# Patient Record
Sex: Male | Born: 1945 | Race: White | Hispanic: No | Marital: Married | Smoking: Former smoker
Health system: Southern US, Community
[De-identification: ages and names within clinical notes are randomized; demographics above are authoritative.]

## PROBLEM LIST (undated history)

## (undated) DIAGNOSIS — I639 Cerebral infarction, unspecified: Secondary | ICD-10-CM

## (undated) DIAGNOSIS — I10 Essential (primary) hypertension: Secondary | ICD-10-CM

## (undated) DIAGNOSIS — I471 Supraventricular tachycardia, unspecified: Secondary | ICD-10-CM

## (undated) DIAGNOSIS — N184 Chronic kidney disease, stage 4 (severe): Secondary | ICD-10-CM

## (undated) DIAGNOSIS — J9600 Acute respiratory failure, unspecified whether with hypoxia or hypercapnia: Secondary | ICD-10-CM

## (undated) DIAGNOSIS — I255 Ischemic cardiomyopathy: Secondary | ICD-10-CM

## (undated) DIAGNOSIS — I5022 Chronic systolic (congestive) heart failure: Secondary | ICD-10-CM

## (undated) DIAGNOSIS — E785 Hyperlipidemia, unspecified: Secondary | ICD-10-CM

## (undated) DIAGNOSIS — G459 Transient cerebral ischemic attack, unspecified: Secondary | ICD-10-CM

## (undated) DIAGNOSIS — J449 Chronic obstructive pulmonary disease, unspecified: Secondary | ICD-10-CM

## (undated) DIAGNOSIS — I2510 Atherosclerotic heart disease of native coronary artery without angina pectoris: Secondary | ICD-10-CM

## (undated) DIAGNOSIS — N189 Chronic kidney disease, unspecified: Secondary | ICD-10-CM

## (undated) DIAGNOSIS — I6529 Occlusion and stenosis of unspecified carotid artery: Secondary | ICD-10-CM

## (undated) DIAGNOSIS — I491 Atrial premature depolarization: Secondary | ICD-10-CM

## (undated) DIAGNOSIS — I4892 Unspecified atrial flutter: Secondary | ICD-10-CM

## (undated) DIAGNOSIS — I493 Ventricular premature depolarization: Secondary | ICD-10-CM

## (undated) DIAGNOSIS — J189 Pneumonia, unspecified organism: Secondary | ICD-10-CM

## (undated) DIAGNOSIS — K219 Gastro-esophageal reflux disease without esophagitis: Secondary | ICD-10-CM

## (undated) DIAGNOSIS — I469 Cardiac arrest, cause unspecified: Secondary | ICD-10-CM

## (undated) DIAGNOSIS — G4730 Sleep apnea, unspecified: Secondary | ICD-10-CM

## (undated) DIAGNOSIS — Z95810 Presence of automatic (implantable) cardiac defibrillator: Secondary | ICD-10-CM

## (undated) DIAGNOSIS — J9610 Chronic respiratory failure, unspecified whether with hypoxia or hypercapnia: Secondary | ICD-10-CM

## (undated) DIAGNOSIS — F0390 Unspecified dementia without behavioral disturbance: Secondary | ICD-10-CM

## (undated) DIAGNOSIS — I480 Paroxysmal atrial fibrillation: Secondary | ICD-10-CM

## (undated) HISTORY — PX: CORONARY ARTERY BYPASS GRAFT: SHX141

## (undated) HISTORY — DX: Ischemic cardiomyopathy: I25.5

## (undated) HISTORY — DX: Presence of automatic (implantable) cardiac defibrillator: Z95.810

## (undated) HISTORY — DX: Occlusion and stenosis of unspecified carotid artery: I65.29

## (undated) HISTORY — DX: Pneumonia, unspecified organism: J18.9

## (undated) HISTORY — PX: EYE SURGERY: SHX253

## (undated) HISTORY — DX: Unspecified dementia, unspecified severity, without behavioral disturbance, psychotic disturbance, mood disturbance, and anxiety: F03.90

## (undated) HISTORY — DX: Cerebral infarction, unspecified: I63.9

## (undated) HISTORY — PX: CARDIAC PACEMAKER PLACEMENT: SHX583

## (undated) HISTORY — DX: Essential (primary) hypertension: I10

## (undated) HISTORY — DX: Acute respiratory failure, unspecified whether with hypoxia or hypercapnia: J96.00

## (undated) HISTORY — DX: Sleep apnea, unspecified: G47.30

## (undated) HISTORY — DX: Atherosclerotic heart disease of native coronary artery without angina pectoris: I25.10

## (undated) HISTORY — PX: CATARACT EXTRACTION: SUR2

## (undated) HISTORY — DX: Chronic kidney disease, unspecified: N18.9

## (undated) HISTORY — DX: Chronic obstructive pulmonary disease, unspecified: J44.9

## (undated) HISTORY — DX: Gastro-esophageal reflux disease without esophagitis: K21.9

## (undated) HISTORY — DX: Paroxysmal atrial fibrillation: I48.0

## (undated) HISTORY — PX: CORONARY STENT PLACEMENT: SHX1402

## (undated) HISTORY — DX: Transient cerebral ischemic attack, unspecified: G45.9

## (undated) HISTORY — DX: Hyperlipidemia, unspecified: E78.5

---

## 1998-09-03 ENCOUNTER — Emergency Department (HOSPITAL_COMMUNITY): Admission: EM | Admit: 1998-09-03 | Discharge: 1998-09-03 | Payer: Self-pay | Admitting: Emergency Medicine

## 1999-10-08 ENCOUNTER — Encounter: Payer: Self-pay | Admitting: Cardiology

## 1999-10-08 ENCOUNTER — Ambulatory Visit (HOSPITAL_COMMUNITY): Admission: RE | Admit: 1999-10-08 | Discharge: 1999-10-08 | Payer: Self-pay | Admitting: Cardiology

## 2014-11-10 ENCOUNTER — Encounter: Payer: Self-pay | Admitting: Cardiology

## 2015-11-23 ENCOUNTER — Other Ambulatory Visit: Payer: Self-pay | Admitting: *Deleted

## 2015-11-23 ENCOUNTER — Encounter: Payer: Self-pay | Admitting: Vascular Surgery

## 2015-11-23 DIAGNOSIS — I639 Cerebral infarction, unspecified: Secondary | ICD-10-CM

## 2015-11-23 DIAGNOSIS — I6523 Occlusion and stenosis of bilateral carotid arteries: Secondary | ICD-10-CM

## 2015-11-27 ENCOUNTER — Ambulatory Visit (HOSPITAL_COMMUNITY)
Admission: RE | Admit: 2015-11-27 | Discharge: 2015-11-27 | Disposition: A | Discharge status: Home / Self Care | Payer: Medicare HMO | Source: Ambulatory Visit | Attending: Vascular Surgery | Admitting: Vascular Surgery

## 2015-11-27 ENCOUNTER — Encounter: Payer: Self-pay | Admitting: Vascular Surgery

## 2015-11-27 DIAGNOSIS — E785 Hyperlipidemia, unspecified: Secondary | ICD-10-CM | POA: Insufficient documentation

## 2015-11-27 DIAGNOSIS — I6523 Occlusion and stenosis of bilateral carotid arteries: Secondary | ICD-10-CM | POA: Insufficient documentation

## 2015-11-27 DIAGNOSIS — G459 Transient cerebral ischemic attack, unspecified: Secondary | ICD-10-CM | POA: Diagnosis not present

## 2015-11-27 DIAGNOSIS — I639 Cerebral infarction, unspecified: Secondary | ICD-10-CM

## 2015-11-27 DIAGNOSIS — N189 Chronic kidney disease, unspecified: Secondary | ICD-10-CM | POA: Insufficient documentation

## 2015-11-27 DIAGNOSIS — I129 Hypertensive chronic kidney disease with stage 1 through stage 4 chronic kidney disease, or unspecified chronic kidney disease: Secondary | ICD-10-CM | POA: Diagnosis not present

## 2015-12-01 ENCOUNTER — Encounter: Payer: Self-pay | Admitting: Vascular Surgery

## 2015-12-01 ENCOUNTER — Ambulatory Visit (INDEPENDENT_AMBULATORY_CARE_PROVIDER_SITE_OTHER): Payer: Medicare HMO | Admitting: Vascular Surgery

## 2015-12-01 VITALS — BP 150/77 | HR 61 | Temp 98.1°F | Resp 16 | Ht 69.0 in | Wt 167.0 lb

## 2015-12-01 DIAGNOSIS — I6522 Occlusion and stenosis of left carotid artery: Secondary | ICD-10-CM

## 2015-12-01 DIAGNOSIS — I6521 Occlusion and stenosis of right carotid artery: Secondary | ICD-10-CM | POA: Diagnosis not present

## 2015-12-01 NOTE — Addendum Note (Signed)
Addended by: Thresa Ross C on: 12/01/2015 02:05 PM   Modules accepted: Orders

## 2015-12-01 NOTE — Progress Notes (Signed)
Filed Vitals:   12/01/15 1128 12/01/15 1133 12/01/15 1138 12/01/15 1140  BP: 154/75 151/75 156/75 150/77  Pulse: 60 60 60 61  Temp:  98.1 F (36.7 C)    TempSrc:  Oral    Resp:  16    Height:  5\' 9"  (1.753 m)    Weight:  167 lb (75.751 kg)    SpO2:  98%

## 2015-12-01 NOTE — Progress Notes (Signed)
[facility]ist [facility]  [name] MRN: [id number] DOB: [birth year] Sex: male  REASON FOR CONSULT: establish carotid follow-up   HPI: Joe Wiggins is a 69 y.o. male, who was referred here by Dr. Wynonia Lawman to establish care. The patient is s/p left carotid stenting by Dr. Maryjean Morn back in 2013. He suffered stroke during the procedure and on postoperative day 1. He has a known right internal carotid occlusion. It is unclear whether this was associated with a stroke or not. He has mild neurological deficits including some memory issues and a slight foot drop. He denies any amaurosis fugax, receptive or expressive aphasia, facial droop or sudden numbness/weakness of his arms or legs.   He has a past medical history of paroxysmal atrial fibrillation managed on eliquis. He has CAD s/p CABG. He has hyperlipidemia managed on a statin and hypertension managed on two antihypertensives. He takes a daily aspirin. He is not diabetic. He is a former smoker.  On ROS, he denies any issues with ambulation. He walks around 2 miles a day and only has to rest secondary to feeling tired. Full ROS below.   Past Medical History  Diagnosis Date  . Chronic kidney disease   . AF (paroxysmal atrial fibrillation) ([place])   . Hypertension   . Sleep apnea   . COPD (chronic obstructive pulmonary disease) ([place])   . TIA (transient ischemic attack)   . Dementia without behavioral disturbance   . CAD (coronary artery disease)   . Cardiomyopathy, ischemic   . Carotid artery occlusion   . Hyperlipidemia     History reviewed. No pertinent family history.  SOCIAL HISTORY: Social History   Social History  . Marital Status: Married    Spouse Name: N/A  . Number of Children: N/A  . Years of Education: N/A   Occupational History  . Not on file.   Social History Main Topics  . Smoking status: Former Smoker    Quit date: 11/30/2012  . Smokeless tobacco: Never Used  . Alcohol Use: No  . Drug  Use: No  . Sexual Activity: Not on file   Other Topics Concern  . Not on file   Social History Narrative    Allergies  Allergen Reactions  . Morphine And Related Nausea Only  . Vicodin [Hydrocodone-Acetaminophen] Other (See Comments)    Hallucinations   . Penicillins Rash    Current Outpatient Prescriptions  Medication Sig Dispense Refill  . apixaban (ELIQUIS) 5 MG TABS tablet Take 5 mg by mouth 2 (two) times daily.    Marland Kitchen aspirin 81 MG chewable tablet Chew by mouth daily.    Marland Kitchen atorvastatin (LIPITOR) 40 MG tablet Take 40 mg by mouth daily.    . Cholecalciferol (VITAMIN D3) 3000 UNITS TABS Take 1,000 Units by mouth daily.    . Cyanocobalamin (VITAMIN B 12 PO) Take by mouth daily.    Marland Kitchen donepezil (ARICEPT) 10 MG tablet Take 10 mg by mouth at bedtime.    . famotidine (PEPCID) 20 MG tablet Take 20 mg by mouth daily.    . Fluticasone-Salmeterol (ADVAIR) 250-50 MCG/DOSE AEPB Inhale 1 puff into the lungs 2 (two) times daily.    . folic acid (FOLVITE) A999333 MCG tablet Take 400 mcg by mouth daily.    . hydrALAZINE (APRESOLINE) 25 MG tablet Take 25 mg by mouth 3 (three) times daily. Per medication list from Dr Serita Grit Tilley's office notes: take 1/2 tablet po three times daily    .  labetalol (NORMODYNE) 200 MG tablet Take 200 mg by mouth 2 (two) times daily.    Marland Kitchen levalbuterol (XOPENEX) 1.25 MG/3ML nebulizer solution Take 1.25 mg by nebulization as needed for wheezing.    Marland Kitchen loratadine (CLARITIN) 10 MG tablet Take 10 mg by mouth daily.    . Memantine HCl (NAMENDA XR PO) Take 7 mg by mouth.    . Multiple Vitamin (MULTIVITAMIN) tablet Take 1 tablet by mouth daily.    . nitroGLYCERIN (NITROSTAT) 0.4 MG SL tablet Place 0.4 mg under the tongue every 5 (five) minutes as needed for chest pain.    Marland Kitchen tiotropium (SPIRIVA HANDIHALER) 18 MCG inhalation capsule Place 18 mcg into inhaler and inhale daily.    Marland Kitchen torsemide (DEMADEX) 20 MG tablet Take 20 mg by mouth daily.     No current facility-administered  medications for this visit.    REVIEW OF SYSTEMS:  [X]  denotes positive finding, [ ]  denotes negative finding Cardiac  Comments:  Chest pain or chest pressure:    Shortness of breath upon exertion:    Short of breath when lying flat:    Irregular heart rhythm:        Vascular    Pain in calf, thigh, or hip brought on by ambulation:    Pain in feet at night that wakes you up from your sleep:     Blood clot in your veins:    Leg swelling:         Pulmonary    Oxygen at home:    Productive cough:     Wheezing:         Neurologic    Sudden weakness in arms or legs:     Sudden numbness in arms or legs:     Sudden onset of difficulty speaking or slurred speech:    Temporary loss of vision in one eye:     Problems with dizziness:         Gastrointestinal    Blood in stool:     Vomited blood:         Genitourinary    Burning when urinating:     Blood in urine:        Psychiatric    Major depression:         Hematologic    Bleeding problems:    Problems with blood clotting too easily:        Skin    Rashes or ulcers:        Constitutional    Fever or chills:      PHYSICAL EXAM: Filed Vitals:   12/01/15 1128 12/01/15 1133 12/01/15 1138 12/01/15 1140  BP: 154/75 151/75 156/75 150/77  Pulse: 60 60 60 61  Temp:  98.1 F (36.7 C)    TempSrc:  Oral    Resp:  16    Height:  5\' 9"  (1.753 m)    Weight:  167 lb (75.751 kg)    SpO2:  98%      GENERAL: The patient is a well-nourished male, in no acute distress. The vital signs are documented above. CARDIAC: There is a regular rate and rhythm.  VASCULAR: bilateral carotid bruits, radial pulses 2+, feet are warm and well perfused. No ischemic changes.  PULMONARY: There is good air exchange bilaterally without wheezing or rales. ABDOMEN: Soft and non-tender with normal pitched bowel sounds.  MUSCULOSKELETAL: No muscle wasting or atrophy NEUROLOGIC: 5/5 strength upper and lower extremities bilaterally. Sensation intact.  No focal deficits.  PSYCHIATRIC: The  patient has a normal affect.  DATA:   Carotid Duplex 12/01/15 Right ICA occlusion, antegrade vertebral flow Patent left ICA stent, PSV 117cm/s at proximal ICA, EDV 41 cm/s, antegrade vertebral flow   10 pages of medical records from former vascular surgeon were reviewed.   MEDICAL ISSUES:  Occluded right carotid artery Left carotid stenosis s/p carotid stenting (2013)  The patient's left carotid stent is patent. He has been asymptomatic without any TIA or stroke symptoms. He has a known right carotid occlusion. There was no known stroke associated with this.  He is on maximal medical management with aspirin and a statin. He is on eliquis for atrial fibrillation. He is a former smoker. He will follow-up in one year with repeat carotid duplex. He knows to see emergency attention if he were to develop any acute neurological symptoms.    Alvia Grove, PA-C [facility]     I have examined the patient, reviewed and agree with above. Discussed with patient and wife. Recommended yearly duplex for follow-up of his carotid stent particularly in light of contralateral carotid occlusion. Discussed symptoms of carotid disease and posterior. Immediately to the emergency room should this occur  Curt Jews, MD 12/01/2015 1:17 PM

## 2015-12-02 ENCOUNTER — Encounter: Payer: Self-pay | Admitting: Surgery

## 2016-01-17 ENCOUNTER — Observation Stay (HOSPITAL_COMMUNITY)
Admission: EM | Admit: 2016-01-17 | Discharge: 2016-01-19 | Disposition: A | Discharge status: Home / Self Care | Payer: PPO | Attending: Internal Medicine | Admitting: Internal Medicine

## 2016-01-17 ENCOUNTER — Emergency Department (HOSPITAL_COMMUNITY): Payer: PPO

## 2016-01-17 ENCOUNTER — Encounter (HOSPITAL_COMMUNITY): Payer: Self-pay

## 2016-01-17 ENCOUNTER — Observation Stay (HOSPITAL_COMMUNITY): Payer: PPO

## 2016-01-17 DIAGNOSIS — R42 Dizziness and giddiness: Secondary | ICD-10-CM

## 2016-01-17 DIAGNOSIS — Z8673 Personal history of transient ischemic attack (TIA), and cerebral infarction without residual deficits: Secondary | ICD-10-CM | POA: Diagnosis not present

## 2016-01-17 DIAGNOSIS — F0390 Unspecified dementia without behavioral disturbance: Secondary | ICD-10-CM | POA: Insufficient documentation

## 2016-01-17 DIAGNOSIS — Z885 Allergy status to narcotic agent status: Secondary | ICD-10-CM | POA: Insufficient documentation

## 2016-01-17 DIAGNOSIS — N189 Chronic kidney disease, unspecified: Secondary | ICD-10-CM | POA: Insufficient documentation

## 2016-01-17 DIAGNOSIS — I6529 Occlusion and stenosis of unspecified carotid artery: Secondary | ICD-10-CM | POA: Diagnosis not present

## 2016-01-17 DIAGNOSIS — Z880 Allergy status to penicillin: Secondary | ICD-10-CM | POA: Insufficient documentation

## 2016-01-17 DIAGNOSIS — E785 Hyperlipidemia, unspecified: Secondary | ICD-10-CM | POA: Insufficient documentation

## 2016-01-17 DIAGNOSIS — I129 Hypertensive chronic kidney disease with stage 1 through stage 4 chronic kidney disease, or unspecified chronic kidney disease: Secondary | ICD-10-CM | POA: Diagnosis not present

## 2016-01-17 DIAGNOSIS — I252 Old myocardial infarction: Secondary | ICD-10-CM | POA: Insufficient documentation

## 2016-01-17 DIAGNOSIS — I482 Chronic atrial fibrillation: Secondary | ICD-10-CM

## 2016-01-17 DIAGNOSIS — I10 Essential (primary) hypertension: Secondary | ICD-10-CM | POA: Diagnosis not present

## 2016-01-17 DIAGNOSIS — J449 Chronic obstructive pulmonary disease, unspecified: Secondary | ICD-10-CM | POA: Insufficient documentation

## 2016-01-17 DIAGNOSIS — I639 Cerebral infarction, unspecified: Secondary | ICD-10-CM | POA: Diagnosis not present

## 2016-01-17 DIAGNOSIS — N184 Chronic kidney disease, stage 4 (severe): Secondary | ICD-10-CM | POA: Insufficient documentation

## 2016-01-17 DIAGNOSIS — G459 Transient cerebral ischemic attack, unspecified: Secondary | ICD-10-CM | POA: Insufficient documentation

## 2016-01-17 DIAGNOSIS — N182 Chronic kidney disease, stage 2 (mild): Secondary | ICD-10-CM

## 2016-01-17 DIAGNOSIS — I2510 Atherosclerotic heart disease of native coronary artery without angina pectoris: Secondary | ICD-10-CM | POA: Diagnosis not present

## 2016-01-17 DIAGNOSIS — N183 Chronic kidney disease, stage 3 unspecified: Secondary | ICD-10-CM | POA: Insufficient documentation

## 2016-01-17 DIAGNOSIS — R930 Abnormal findings on diagnostic imaging of skull and head, not elsewhere classified: Secondary | ICD-10-CM | POA: Diagnosis not present

## 2016-01-17 DIAGNOSIS — J410 Simple chronic bronchitis: Secondary | ICD-10-CM

## 2016-01-17 DIAGNOSIS — Z955 Presence of coronary angioplasty implant and graft: Secondary | ICD-10-CM | POA: Diagnosis not present

## 2016-01-17 DIAGNOSIS — Z950 Presence of cardiac pacemaker: Secondary | ICD-10-CM | POA: Diagnosis not present

## 2016-01-17 DIAGNOSIS — Z7982 Long term (current) use of aspirin: Secondary | ICD-10-CM | POA: Diagnosis not present

## 2016-01-17 DIAGNOSIS — J441 Chronic obstructive pulmonary disease with (acute) exacerbation: Secondary | ICD-10-CM | POA: Insufficient documentation

## 2016-01-17 DIAGNOSIS — Z7901 Long term (current) use of anticoagulants: Secondary | ICD-10-CM | POA: Insufficient documentation

## 2016-01-17 DIAGNOSIS — G4730 Sleep apnea, unspecified: Secondary | ICD-10-CM | POA: Diagnosis present

## 2016-01-17 DIAGNOSIS — Z87891 Personal history of nicotine dependence: Secondary | ICD-10-CM | POA: Diagnosis not present

## 2016-01-17 DIAGNOSIS — G4733 Obstructive sleep apnea (adult) (pediatric): Secondary | ICD-10-CM | POA: Diagnosis not present

## 2016-01-17 DIAGNOSIS — I119 Hypertensive heart disease without heart failure: Secondary | ICD-10-CM | POA: Insufficient documentation

## 2016-01-17 DIAGNOSIS — I6523 Occlusion and stenosis of bilateral carotid arteries: Secondary | ICD-10-CM | POA: Insufficient documentation

## 2016-01-17 DIAGNOSIS — N179 Acute kidney failure, unspecified: Secondary | ICD-10-CM | POA: Insufficient documentation

## 2016-01-17 DIAGNOSIS — R040 Epistaxis: Secondary | ICD-10-CM | POA: Diagnosis not present

## 2016-01-17 DIAGNOSIS — R9089 Other abnormal findings on diagnostic imaging of central nervous system: Secondary | ICD-10-CM | POA: Diagnosis present

## 2016-01-17 DIAGNOSIS — I255 Ischemic cardiomyopathy: Secondary | ICD-10-CM | POA: Diagnosis not present

## 2016-01-17 DIAGNOSIS — E875 Hyperkalemia: Secondary | ICD-10-CM | POA: Diagnosis not present

## 2016-01-17 DIAGNOSIS — I480 Paroxysmal atrial fibrillation: Secondary | ICD-10-CM | POA: Insufficient documentation

## 2016-01-17 DIAGNOSIS — J438 Other emphysema: Secondary | ICD-10-CM

## 2016-01-17 DIAGNOSIS — D649 Anemia, unspecified: Secondary | ICD-10-CM | POA: Diagnosis present

## 2016-01-17 HISTORY — DX: Dizziness and giddiness: R42

## 2016-01-17 LAB — CBC
HCT: 32.8 % — ABNORMAL LOW (ref 39.0–52.0)
Hemoglobin: 10.7 g/dL — ABNORMAL LOW (ref 13.0–17.0)
MCH: 32.5 pg (ref 26.0–34.0)
MCHC: 32.6 g/dL (ref 30.0–36.0)
MCV: 99.7 fL (ref 78.0–100.0)
Platelets: 261 10*3/uL (ref 150–400)
RBC: 3.29 MIL/uL — ABNORMAL LOW (ref 4.22–5.81)
RDW: 14.3 % (ref 11.5–15.5)
WBC: 5.3 10*3/uL (ref 4.0–10.5)

## 2016-01-17 LAB — TROPONIN I: Troponin I: <0.03 ng/mL (ref <0.031)

## 2016-01-17 LAB — LIPID PANEL
Cholesterol: 129 mg/dL (ref 0–200)
HDL: 28 mg/dL — ABNORMAL LOW (ref >40)
LDL Cholesterol: 76 mg/dL (ref 0–99)
Total CHOL/HDL Ratio: 4.6 RATIO
Triglycerides: 123 mg/dL (ref <150)
VLDL: 25 mg/dL (ref 0–40)

## 2016-01-17 LAB — BASIC METABOLIC PANEL
Anion gap: 7 (ref 5–15)
BUN: 25 mg/dL — ABNORMAL HIGH (ref 6–20)
CO2: 29 mmol/L (ref 22–32)
Calcium: 10.4 mg/dL — ABNORMAL HIGH (ref 8.9–10.3)
Chloride: 104 mmol/L (ref 101–111)
Creatinine, Ser: 1.65 mg/dL — ABNORMAL HIGH (ref 0.61–1.24)
GFR calc Af Amer: 47 mL/min — ABNORMAL LOW (ref >60)
GFR calc non Af Amer: 40 mL/min — ABNORMAL LOW (ref >60)
Glucose, Bld: 103 mg/dL — ABNORMAL HIGH (ref 65–99)
Potassium: 5.4 mmol/L — ABNORMAL HIGH (ref 3.5–5.1)
Sodium: 140 mmol/L (ref 135–145)

## 2016-01-17 LAB — URINALYSIS, ROUTINE W REFLEX MICROSCOPIC
Bilirubin Urine: NEGATIVE
Glucose, UA: NEGATIVE mg/dL
Hgb urine dipstick: NEGATIVE
Ketones, ur: NEGATIVE mg/dL
Leukocytes, UA: NEGATIVE
Nitrite: NEGATIVE
Protein, ur: NEGATIVE mg/dL
Specific Gravity, Urine: 1.015 (ref 1.005–1.030)
pH: 7 (ref 5.0–8.0)

## 2016-01-17 LAB — TSH: TSH: 0.574 u[IU]/mL (ref 0.350–4.500)

## 2016-01-17 LAB — I-STAT TROPONIN, ED: Troponin i, poc: 0.01 ng/mL (ref 0.00–0.08)

## 2016-01-17 MED ORDER — ALBUTEROL SULFATE (2.5 MG/3ML) 0.083% IN NEBU
2.5000 mg | INHALATION_SOLUTION | Freq: Three times a day (TID) | RESPIRATORY_TRACT | Status: DC
  Filled 2016-01-17: qty 3

## 2016-01-17 MED ORDER — ATORVASTATIN CALCIUM 40 MG PO TABS
40.0000 mg | ORAL_TABLET | Freq: Every day | ORAL | Status: DC
  Administered 2016-01-18 – 2016-01-19 (×2): 40 mg via ORAL
  Filled 2016-01-17 (×2): qty 1

## 2016-01-17 MED ORDER — ONDANSETRON HCL 4 MG/2ML IJ SOLN: 4.0000 mg | Freq: Four times a day (QID) | INTRAMUSCULAR | Status: DC | PRN

## 2016-01-17 MED ORDER — TIOTROPIUM BROMIDE MONOHYDRATE 18 MCG IN CAPS
18.0000 ug | ORAL_CAPSULE | Freq: Every day | RESPIRATORY_TRACT | Status: DC
  Administered 2016-01-19: 18 ug via RESPIRATORY_TRACT
  Filled 2016-01-17: qty 5

## 2016-01-17 MED ORDER — SODIUM CHLORIDE 0.9 % IJ SOLN
3.0000 mL | Freq: Two times a day (BID) | INTRAMUSCULAR | Status: DC
  Administered 2016-01-17 – 2016-01-19 (×4): 3 mL via INTRAVENOUS

## 2016-01-17 MED ORDER — POLYETHYLENE GLYCOL 3350 17 G PO PACK
17.0000 g | PACK | Freq: Every day | ORAL | Status: DC | PRN
  Filled 2016-01-17: qty 1

## 2016-01-17 MED ORDER — ASPIRIN 81 MG PO CHEW
81.0000 mg | CHEWABLE_TABLET | Freq: Every day | ORAL | Status: DC
  Administered 2016-01-18 – 2016-01-19 (×2): 81 mg via ORAL
  Filled 2016-01-17 (×2): qty 1

## 2016-01-17 MED ORDER — FOLIC ACID 400 MCG PO TABS: 400.0000 ug | ORAL_TABLET | Freq: Every day | ORAL | Status: DC

## 2016-01-17 MED ORDER — ONDANSETRON HCL 4 MG PO TABS: 4.0000 mg | ORAL_TABLET | Freq: Four times a day (QID) | ORAL | Status: DC | PRN

## 2016-01-17 MED ORDER — HYDRALAZINE HCL 25 MG PO TABS
25.0000 mg | ORAL_TABLET | Freq: Once | ORAL | Status: AC
Start: 2016-01-17 — End: 2016-01-17
  Administered 2016-01-17: 25 mg via ORAL
  Filled 2016-01-17: qty 1

## 2016-01-17 MED ORDER — ALBUTEROL SULFATE (5 MG/ML) 0.5% IN NEBU: 2.5000 mg | INHALATION_SOLUTION | Freq: Four times a day (QID) | RESPIRATORY_TRACT | Status: DC

## 2016-01-17 MED ORDER — HYDRALAZINE HCL 20 MG/ML IJ SOLN
10.0000 mg | Freq: Four times a day (QID) | INTRAMUSCULAR | Status: DC | PRN
  Administered 2016-01-17 – 2016-01-19 (×3): 10 mg via INTRAVENOUS
  Filled 2016-01-17 (×3): qty 1

## 2016-01-17 MED ORDER — STROKE: EARLY STAGES OF RECOVERY BOOK
Freq: Once | Status: DC
  Filled 2016-01-17: qty 1

## 2016-01-17 MED ORDER — SODIUM CHLORIDE 0.9 % IV BOLUS (SEPSIS)
1000.0000 mL | Freq: Once | INTRAVENOUS | Status: AC
  Administered 2016-01-17: 1000 mL via INTRAVENOUS

## 2016-01-17 MED ORDER — FAMOTIDINE 20 MG PO TABS
20.0000 mg | ORAL_TABLET | Freq: Every day | ORAL | Status: DC
  Administered 2016-01-18 – 2016-01-19 (×2): 20 mg via ORAL
  Filled 2016-01-17 (×2): qty 1

## 2016-01-17 MED ORDER — MOMETASONE FURO-FORMOTEROL FUM 100-5 MCG/ACT IN AERO
2.0000 | INHALATION_SPRAY | Freq: Two times a day (BID) | RESPIRATORY_TRACT | Status: DC
  Administered 2016-01-17 – 2016-01-19 (×3): 2 via RESPIRATORY_TRACT
  Filled 2016-01-17: qty 8.8

## 2016-01-17 MED ORDER — SODIUM CHLORIDE 0.9 % IV SOLN: INTRAVENOUS | Status: AC

## 2016-01-17 MED ORDER — APIXABAN 5 MG PO TABS
5.0000 mg | ORAL_TABLET | Freq: Two times a day (BID) | ORAL | Status: DC
  Administered 2016-01-18 – 2016-01-19 (×3): 5 mg via ORAL
  Filled 2016-01-17 (×4): qty 1

## 2016-01-17 MED ORDER — ALUM & MAG HYDROXIDE-SIMETH 200-200-20 MG/5ML PO SUSP: 30.0000 mL | Freq: Four times a day (QID) | ORAL | Status: DC | PRN

## 2016-01-17 MED ORDER — FOLIC ACID 1 MG PO TABS
0.5000 mg | ORAL_TABLET | Freq: Every day | ORAL | Status: DC
  Administered 2016-01-17 – 2016-01-19 (×3): 0.5 mg via ORAL
  Filled 2016-01-17 (×3): qty 1

## 2016-01-17 MED ORDER — TORSEMIDE 20 MG PO TABS
20.0000 mg | ORAL_TABLET | Freq: Every day | ORAL | Status: DC
  Administered 2016-01-18 – 2016-01-19 (×2): 20 mg via ORAL
  Filled 2016-01-17 (×2): qty 1

## 2016-01-17 MED ORDER — ALBUTEROL SULFATE (2.5 MG/3ML) 0.083% IN NEBU
2.5000 mg | INHALATION_SOLUTION | Freq: Four times a day (QID) | RESPIRATORY_TRACT | Status: DC
  Administered 2016-01-17: 2.5 mg via RESPIRATORY_TRACT
  Filled 2016-01-17: qty 3

## 2016-01-17 MED ORDER — DONEPEZIL HCL 10 MG PO TABS
10.0000 mg | ORAL_TABLET | Freq: Every day | ORAL | Status: DC
  Administered 2016-01-17 – 2016-01-18 (×2): 10 mg via ORAL
  Filled 2016-01-17 (×2): qty 1

## 2016-01-17 MED ORDER — SENNOSIDES-DOCUSATE SODIUM 8.6-50 MG PO TABS: 1.0000 | ORAL_TABLET | Freq: Every evening | ORAL | Status: DC | PRN

## 2016-01-17 MED ORDER — HYDRALAZINE HCL 25 MG PO TABS
25.0000 mg | ORAL_TABLET | Freq: Three times a day (TID) | ORAL | Status: DC
  Administered 2016-01-17 – 2016-01-19 (×5): 25 mg via ORAL
  Filled 2016-01-17 (×6): qty 1

## 2016-01-17 NOTE — ED Notes (Signed)
Pt. s wife reports that he had a hx of  Strokes and pt. Reports that he continues to feel dizzy.

## 2016-01-17 NOTE — ED Notes (Signed)
Vital signs stable. 

## 2016-01-17 NOTE — ED Notes (Signed)
Pt given urinal for specimen.  

## 2016-01-17 NOTE — Progress Notes (Signed)
Patient arrived at 5M20. Patient is alert and oriented x3, disoriented to situation. Denies pain, NIHHS 1 for numbness/tingling in left foot which patient states is his baseline from previous stroke, VSS, tele started on box 17, states he is on a CPAP at home, respiratory called and aware that patient wears CPAP at night, will set patient up. Patient and family oriented to unit, staff, plan of care. Safety measures in place, will continue to monitor closely.,

## 2016-01-17 NOTE — Consult Note (Signed)
Referring Physician: ED    Chief Complaint: transient dizziness, question of new stroke on CT brain  HPI:                                                                                                                                         Joe Wiggins is an 70 y.o. male with a past medical history significant for HTN, HLD, CAD s/p CABG, ischemic cardiomyopathy, PAF on apixaban, s/p pacemaker placement, carotid artery disease, ischemic infarct with mild left LE weakness as residual, and dementia, presents to the ED for evaluation of the above stated symptoms. Patient was in his usual state of health until earlier today when started having a nosebleed and feeling " dizzy, swimmy headed, kind of off balance". That sensation was not really vertiginous and lasted for approximately 30 minutes. No associated HA, double vision, difficulty swallowing, slurred speech, language or vision impairment. CT head reveals " more focal hypoattenuation subcortical white matter of the posterior right frontal lobe". Date last known normal: 01/12/16 Time last known well: unclear tPA Given:  No, symptoms resolved   Past Medical History  Diagnosis Date  . Chronic kidney disease   . AF (paroxysmal atrial fibrillation) ([place])   . Hypertension   . Sleep apnea   . COPD (chronic obstructive pulmonary disease) ([place])   . TIA (transient ischemic attack)   . Dementia without behavioral disturbance   . CAD (coronary artery disease)   . Cardiomyopathy, ischemic   . Carotid artery occlusion   . Hyperlipidemia     Past Surgical History  Procedure Laterality Date  . Cardiac pacemaker placement    . Coronary stent placement    . Coronary artery bypass graft    . Cataract extraction    . Eye surgery      No family history on file. Social History:  reports that he quit smoking about 3 years ago. He has never used smokeless tobacco. He reports that he does not drink alcohol or use illicit drugs. Allergies:  Allergies   Allergen Reactions  . Morphine And Related Nausea Only  . Vicodin [Hydrocodone-Acetaminophen] Other (See Comments)    Hallucinations   . Penicillins Rash    Medications:  I have reviewed the patient's current medications.  ROS:                                                                                                                                       History obtained from chart review and the patient  General ROS: negative for - chills, fatigue, fever, night sweats, weight gain or weight loss Psychological ROS: negative for - behavioral disorder, hallucinations, mood swings or suicidal ideation Ophthalmic ROS: negative for - blurry vision, double vision, eye pain or loss of vision ENT ROS: negative for - epistaxis, nasal discharge, oral lesions, sore throat, tinnitus or vertigo Allergy and Immunology ROS: negative for - hives or itchy/watery eyes Hematological and Lymphatic ROS: negative for - bleeding problems, bruising or swollen lymph nodes Endocrine ROS: negative for - galactorrhea, hair pattern changes, polydipsia/polyuria or temperature intolerance Respiratory ROS: negative for - cough, hemoptysis, shortness of breath or wheezing Cardiovascular ROS: negative for - chest pain, dyspnea on exertion, edema or irregular heartbeat Gastrointestinal ROS: negative for - abdominal pain, diarrhea, hematemesis, nausea/vomiting or stool incontinence Genito-Urinary ROS: negative for - dysuria, hematuria, incontinence or urinary frequency/urgency Musculoskeletal ROS: negative for - joint swelling Neurological ROS: as noted in HPI Dermatological ROS: negative for rash and skin lesion changes   Physical exam:  Constitutional: well developed, pleasant male in no apparent distress. Blood pressure 195/95, pulse 66, temperature 98 F (36.7 C), temperature source Oral,  resp. rate 15, height _0  (1.753 m), weight 73.993 kg (163 lb 2 oz), SpO2 96 %. Eyes: no jaundice or exophthalmos.  Head: normocephalic. Neck: supple, no bruits, no JVD. Cardiac: no murmurs. Lungs: clear. Abdomen: soft, no tender, no mass. Extremities: no edema, clubbing, or cyanosis.  Skin: no rash  Neurologic Examination:                                                                                                      General: NAD Mental Status: Alert, oriented, thought content appropriate.  Speech fluent without evidence of aphasia.  Able to follow 3 step commands without difficulty. Cranial Nerves: II: Discs flat bilaterally; Visual fields grossly normal, pupils equal, round, reactive to light and accommodation III,IV, VI: ptosis not present, extra-ocular motions intact bilaterally V,VII: smile symmetric, facial light touch sensation normal bilaterally VIII: hearing normal bilaterally IX,X: uvula rises symmetrically XI: bilateral shoulder shrug XII: midline tongue extension without atrophy or fasciculations  Motor: Right : Upper extremity   5/5    Left:  Upper extremity   5/5  Lower extremity   5/5     Lower extremity   5/5 Tone and bulk:normal tone throughout; no atrophy noted Sensory: Pinprick and light touch intact throughout, bilaterally Deep Tendon Reflexes:  Right: Upper Extremity   Left: Upper extremity   biceps (C-5 to C-6) 2/4   biceps (C-5 to C-6) 2/4 tricep (C7) 2/4    triceps (C7) 2/4 Brachioradialis (C6) 2/4  Brachioradialis (C6) 2/4  Lower Extremity Lower Extremity  quadriceps (L-2 to L-4) 2/4   quadriceps (L-2 to L-4) 2/4 Achilles (S1) 2/4   Achilles (S1) 2/4  Plantars: Right: downgoing   Left: downgoing Cerebellar: normal finger-to-nose,  normal heel-to-shin test Gait:  No tested due to multiple leads    Results for orders placed or performed during the hospital encounter of 01/17/16 (from the past 48 hour(s))  Basic metabolic panel      Status: Abnormal   Collection Time: 01/17/16 12:30 PM  Result Value Ref Range   Sodium 140 135 - 145 mmol/L   Potassium 5.4 (H) 3.5 - 5.1 mmol/L   Chloride 104 101 - 111 mmol/L   CO2 29 22 - 32 mmol/L   Glucose, Bld 103 (H) 65 - 99 mg/dL   BUN 25 (H) 6 - 20 mg/dL   Creatinine, Ser 1.65 (H) 0.61 - 1.24 mg/dL   Calcium 10.4 (H) 8.9 - 10.3 mg/dL   GFR calc non Af Amer 40 (L) >60 mL/min   GFR calc Af Amer 47 (L) >60 mL/min    Comment: (NOTE) The eGFR has been calculated using the CKD EPI equation. This calculation has not been validated in all clinical situations. eGFR's persistently <60 mL/min signify possible Chronic Kidney Disease.    Anion gap 7 5 - 15  CBC     Status: Abnormal   Collection Time: 01/17/16 12:30 PM  Result Value Ref Range   WBC 5.3 4.0 - 10.5 K/uL   RBC 3.29 (L) 4.22 - 5.81 MIL/uL   Hemoglobin 10.7 (L) 13.0 - 17.0 g/dL   HCT 32.8 (L) 39.0 - 52.0 %   MCV 99.7 78.0 - 100.0 fL   MCH 32.5 26.0 - 34.0 pg   MCHC 32.6 30.0 - 36.0 g/dL   RDW 14.3 11.5 - 15.5 %   Platelets 261 150 - 400 K/uL  I-Stat Troponin, ED (not at [facility])     Status: None   Collection Time: 01/17/16 12:55 PM  Result Value Ref Range   Troponin i, poc 0.01 0.00 - 0.08 ng/mL   Comment 3            Comment: Due to the release kinetics of cTnI, a negative result within the first hours of the onset of symptoms does not rule out myocardial infarction with certainty. If myocardial infarction is still suspected, repeat the test at appropriate intervals.   Urinalysis, Routine w reflex microscopic (not at [facility])     Status: None   Collection Time: 01/17/16  3:19 PM  Result Value Ref Range   Color, Urine YELLOW YELLOW   APPearance CLEAR CLEAR   Specific Gravity, Urine 1.015 1.005 - 1.030   pH 7.0 5.0 - 8.0   Glucose, UA NEGATIVE NEGATIVE mg/dL   Hgb urine dipstick NEGATIVE NEGATIVE   Bilirubin Urine NEGATIVE NEGATIVE   Ketones, ur NEGATIVE NEGATIVE mg/dL   Protein, ur NEGATIVE NEGATIVE mg/dL    Nitrite NEGATIVE NEGATIVE   Leukocytes, UA NEGATIVE NEGATIVE    Comment: MICROSCOPIC NOT DONE ON  URINES WITH NEGATIVE PROTEIN, BLOOD, LEUKOCYTES, NITRITE, OR GLUCOSE <1000 mg/dL.   Ct Head Wo Contrast  01/17/2016  CLINICAL DATA:  Acute onset of dizziness. EXAM: CT HEAD WITHOUT CONTRAST TECHNIQUE: Contiguous axial images were obtained from the base of the skull through the vertex without intravenous contrast. COMPARISON:  None. FINDINGS: Brain: No evidence of acute infarction, hemorrhage, extra-axial collection, ventriculomegaly, or mass effect. There is moderate brain parenchymal atrophy and deep white matter chronic small vessel disease changes. There is a more focal hypoattenuation of the subcortical white matter of the posterior right frontal lobe. There is a prior lacunar infarct of the left caudate head. Vascular: No hyperdense vessel or unexpected calcification. Skull: Negative for fracture or focal lesion. Sinuses/Orbits: Polypoid mucosal thickening of bilateral maxillary and ethmoid sinuses. Other: None. IMPRESSION: Moderate atrophy, and chronic microvascular disease. Remote lacunar infarct of the left basal ganglia. More focal hypoattenuation of the subcortical white matter of the posterior right frontal lobe. This may represent an age-indeterminate infarct, favor subacute. Patient's prior CT exams are not available for review. Electronically Signed   By: Fidela Salisbury M.D.   On: 01/17/2016 15:23    Assessment: 70 y.o. male with multiple risk factors for stroke, comes in after sustaining a transient episode of dizziness while having mild epistaxis. Neuro-exam is unimpressive and the CT findings don't explain his symptoms, seem to be an incidental finding. Recommend follow up CT brain in 24 hours. Further stroke work up as per stroke attending.   Stroke Risk Factors - age, HTN, HLD, CAD, ischemic cardiomyopathy, PAF, stroke  Plan: 1. HgbA1c, fasting lipid panel 2. MRI, MRA  of the brain  without contrast 3. Echocardiogram 4. Carotid dopplers 5. Prophylactic therapy-apixaban 6. Risk factor modification 7. Telemetry monitoring 8. Frequent neuro checks 9. PT/OT SLP   Dorian Pod, MD [facility] [phone]  01/17/2016, 5:10 PM

## 2016-01-17 NOTE — ED Notes (Addendum)
PT. Had a nosebleed this morning that lasted approximately 30 minutes, during which his BP was elevated and he felt Lightheaded.  He denies any chest pain, sob , n/v  He stated, "I feel dizzy."  Nose bleed has stopped.  Pt. Does take BP medication.   Pt. Has a hx of strokes.  Pt. Also reports that when he woke up he felt dizzy when he stood up to walk.

## 2016-01-17 NOTE — Progress Notes (Signed)
Patient's nose bleeding again. Bright red blood, BP 204/81. Denies pain, neuro assessment unchanged. Notified night coverage for 6M. Will pass on to night RN.

## 2016-01-17 NOTE — ED Notes (Signed)
Attempted report 

## 2016-01-17 NOTE — ED Provider Notes (Signed)
CSN: [id number]     Arrival date & time 01/17/16  1103 History   First MD Initiated Contact with Patient 01/17/16 1342     Chief Complaint  Patient presents with  . Dizziness    Nose bleed and HTN     (Consider location/radiation/quality/duration/timing/severity/associated sxs/prior Treatment) HPI Comments: 70 year old male with past medical history including hypertension, hyperlipidemia, CAD, ischemic cardiomyopathy, CVA, carotid artery occlusion, A fib on Eliquis who p/w dizziness. The patient states that this morning he began having a slow nosebleed which lasted approximately 30 minutes. During this episode, he states that he felt dizzy which he describes as an off balance sensation. The feeling of dizziness persisted for approximately 15 minutes and then spontaneously resolved. Patient noted to nursing staff that when he woke up he felt dizzy this morning when he stood up to walk. The patient tells me that he has had no further episodes of dizziness, however he told triage that he continues to feel dizzy. No associated chest pain, shortness of breath, nausea, vomiting, diaphoresis, or abdominal pain. He denies any fevers, cough/cold symptoms, or recent illness. No bloody or black stools. Wife states he has chronic anemia.  Patient is a 70 y.o. male presenting with dizziness. The history is provided by the patient.  Dizziness   Past Medical History  Diagnosis Date  . Chronic kidney disease   . AF (paroxysmal atrial fibrillation) ([place])   . Hypertension   . Sleep apnea   . COPD (chronic obstructive pulmonary disease) ([place])   . TIA (transient ischemic attack)   . Dementia without behavioral disturbance   . CAD (coronary artery disease)   . Cardiomyopathy, ischemic   . Carotid artery occlusion   . Hyperlipidemia    Past Surgical History  Procedure Laterality Date  . Cardiac pacemaker placement    . Coronary stent placement    . Coronary artery bypass graft    . Cataract extraction     . Eye surgery     No family history on file. Social History  Substance Use Topics  . Smoking status: Former Smoker    Quit date: 11/30/2012  . Smokeless tobacco: Never Used  . Alcohol Use: No    Review of Systems  Neurological: Positive for dizziness.    10 Systems reviewed and are negative for acute change except as noted in the HPI.   Allergies  Morphine and related; Vicodin; and Penicillins  Home Medications   Prior to Admission medications   Medication Sig Start Date End Date Taking? Authorizing Provider  apixaban (ELIQUIS) 5 MG TABS tablet Take 5 mg by mouth 2 (two) times daily.   Yes Historical Provider, MD  aspirin 81 MG chewable tablet Chew by mouth daily.   Yes Historical Provider, MD  atorvastatin (LIPITOR) 40 MG tablet Take 40 mg by mouth daily.   Yes Historical Provider, MD  Cholecalciferol (VITAMIN D3) 3000 UNITS TABS Take 1,000 Units by mouth daily.   Yes Historical Provider, MD  Cyanocobalamin (VITAMIN B 12 PO) Take by mouth daily.   Yes Historical Provider, MD  donepezil (ARICEPT) 10 MG tablet Take 10 mg by mouth at bedtime.   Yes Historical Provider, MD  famotidine (PEPCID) 20 MG tablet Take 20 mg by mouth daily.   Yes Historical Provider, MD  Fluticasone-Salmeterol (ADVAIR) 250-50 MCG/DOSE AEPB Inhale 1 puff into the lungs 2 (two) times daily.   Yes Historical Provider, MD  folic acid (FOLVITE) A999333 MCG tablet Take 400 mcg by mouth daily.  Yes Historical Provider, MD  hydrALAZINE (APRESOLINE) 25 MG tablet Take 25 mg by mouth 3 (three) times daily. Per medication list from Dr Serita Grit Tilley's office notes: take 1/2 tablet po three times daily   Yes Historical Provider, MD  labetalol (NORMODYNE) 200 MG tablet Take 200 mg by mouth 2 (two) times daily.   Yes Historical Provider, MD  levalbuterol (XOPENEX) 1.25 MG/3ML nebulizer solution Take 1.25 mg by nebulization as needed for wheezing.   Yes Historical Provider, MD  loratadine (CLARITIN) 10 MG tablet Take 10  mg by mouth daily.   Yes Historical Provider, MD  Memantine HCl (NAMENDA XR PO) Take 7 mg by mouth.   Yes Historical Provider, MD  Multiple Vitamin (MULTIVITAMIN) tablet Take 1 tablet by mouth daily.   Yes Historical Provider, MD  tiotropium (SPIRIVA HANDIHALER) 18 MCG inhalation capsule Place 18 mcg into inhaler and inhale daily.   Yes Historical Provider, MD  torsemide (DEMADEX) 20 MG tablet Take 20 mg by mouth daily.   Yes Historical Provider, MD  nitroGLYCERIN (NITROSTAT) 0.4 MG SL tablet Place 0.4 mg under the tongue every 5 (five) minutes as needed for chest pain.    Historical Provider, MD   BP 195/95 mmHg  Pulse 66  Temp(Src) 98 F (36.7 C) (Oral)  Resp 15  Ht 5\' 9"  (1.753 m)  Wt 163 lb 2 oz (73.993 kg)  BMI 24.08 kg/m2  SpO2 96% Physical Exam  Constitutional: He is oriented to person, place, and time. He appears well-developed and well-nourished. No distress.  Awake, alert  HENT:  Head: Normocephalic and atraumatic.  Eyes: Conjunctivae and EOM are normal. Pupils are equal, round, and reactive to light.  Neck: Neck supple.  Cardiovascular: Normal rate, regular rhythm and normal heart sounds.   No murmur heard. Pulmonary/Chest: Effort normal and breath sounds normal. No respiratory distress.  Abdominal: Soft. Bowel sounds are normal. He exhibits no distension.  Musculoskeletal: He exhibits no edema.  Neurological: He is alert and oriented to person, place, and time. He has normal reflexes. No cranial nerve deficit. He exhibits normal muscle tone.  Fluent speech, normal finger-to-nose testing, negative pronator drift, no clonus, 5/5 strength and normal sensation throughout  Skin: Skin is warm and dry.  Psychiatric: He has a normal mood and affect. Judgment and thought content normal.  Nursing note and vitals reviewed.   ED Course  Procedures (including critical care time) Labs Review Labs Reviewed  BASIC METABOLIC PANEL - Abnormal; Notable for the following:    Potassium  5.4 (*)    Glucose, Bld 103 (*)    BUN 25 (*)    Creatinine, Ser 1.65 (*)    Calcium 10.4 (*)    GFR calc non Af Amer 40 (*)    GFR calc Af Amer 47 (*)    All other components within normal limits  CBC - Abnormal; Notable for the following:    RBC 3.29 (*)    Hemoglobin 10.7 (*)    HCT 32.8 (*)    All other components within normal limits  URINALYSIS, ROUTINE W REFLEX MICROSCOPIC (NOT AT [facility])  CBG MONITORING, ED  I-STAT TROPOININ, ED    Imaging Review Ct Head Wo Contrast  01/17/2016  CLINICAL DATA:  Acute onset of dizziness. EXAM: CT HEAD WITHOUT CONTRAST TECHNIQUE: Contiguous axial images were obtained from the base of the skull through the vertex without intravenous contrast. COMPARISON:  None. FINDINGS: Brain: No evidence of acute infarction, hemorrhage, extra-axial collection, ventriculomegaly, or mass effect.  There is moderate brain parenchymal atrophy and deep white matter chronic small vessel disease changes. There is a more focal hypoattenuation of the subcortical white matter of the posterior right frontal lobe. There is a prior lacunar infarct of the left caudate head. Vascular: No hyperdense vessel or unexpected calcification. Skull: Negative for fracture or focal lesion. Sinuses/Orbits: Polypoid mucosal thickening of bilateral maxillary and ethmoid sinuses. Other: None. IMPRESSION: Moderate atrophy, and chronic microvascular disease. Remote lacunar infarct of the left basal ganglia. More focal hypoattenuation of the subcortical white matter of the posterior right frontal lobe. This may represent an age-indeterminate infarct, favor subacute. Patient's prior CT exams are not available for review. Electronically Signed   By: Fidela Salisbury M.D.   On: 01/17/2016 15:23   I have personally reviewed and evaluated these lab results as part of my medical decision-making.   EKG Interpretation   Date/Time:  Sunday January 17 2016 12:31:22 EST Ventricular Rate:  63 PR Interval:   188 QRS Duration: 114 QT Interval:  448 QTC Calculation: 458 R Axis:   32 Text Interpretation:  Atrial-paced rhythm Septal infarct , age  undetermined ST \\T \ T wave abnormality, consider inferolateral ischemia  Abnormal ECG inferolateral ST depression, no previous available for  comparison Confirmed by LITTLE MD, RACHEL [phone]) on 01/17/2016 1:47:35 PM     Medications  hydrALAZINE (APRESOLINE) tablet 25 mg (25 mg Oral Given 01/17/16 1501)  sodium chloride 0.9 % bolus 1,000 mL (1,000 mLs Intravenous New Bag/Given 01/17/16 1508)    MDM   Final diagnoses:  Hyperkalemia  Acute kidney injury ([place])  Dizziness  Essential hypertension   Patient presents with an acute onset of dizziness which began during an episode of epistaxis and persisted after the epistaxis had resolved. On arrival, the patient was awake, alert, well appearing and in no acute distress. Signs notable for hypertension. Neurologic exam was unremarkable and patient denied any complaints during my examination. EKG on arrival showed paced rhythm with inferolateral ST depression, there are no previous EKGs available for comparison. Obtained basic lab work which showed normal troponin, potassium 5.4, creatinine 1.65 with no previous available for comparison. Hemoglobin was 10.7 but wife states that patient has chronic problems with anemia. He denies any blood in his stool or black stools. Gave the patient his home dose of hydralazine as well as IV fluid bolus. Obtained CT of head which showed hypoattenuation of posterior right frontal lobe, possible age indeterminate infarct. Consulted neurology and discussed w/ Dr. Armida Sans who will see pt in consultation. Given hyperkalemia, concerning CT, and AKI, discussed admission with [facility], NP [facility], and pt admitted for further care.   Sharlett Iles, MD 01/17/16 1640

## 2016-01-17 NOTE — ED Notes (Signed)
Report attempted 

## 2016-01-17 NOTE — H&P (Signed)
[facility] History and Physical  Joe Wiggins [id number] DOB: 1946-04-13 DOA: 01/17/2016  Referring physician: little PCP: Leota Jacobsen, MD   Chief Complaint: dizziness  HPI: Joe Wiggins is a very pleasant 70 y.o. male past medical history that includes chronic kidney disease, A. fib, hypertension, COPD, dementia, CAD, lipidemia presents emergency Department chief complaint intermittent dizziness and epistaxis. Initial evaluation reveals abnormal head CT, mild hyperkalemia.  Information is obtained from the patient and the wife is at the bedside. Patient is a poor historian due to mild to moderate dementia. Was his usual state of health this morning when he developed a mild slow nosebleed. Wife reports that last about 30 minutes. Associated symptoms include dizziness and unsteady gait. Persisted for 15-30 minutes. There was no complaints of chest pain palpitation headache syncope or near-syncope. No abdominal pain nausea vomiting. No recent fever chills dysuria hematuria frequency or urgency. No diarrhea sick contacts or travel.  In the emergency department his blood pressures on the higher end of normal he's afebrile he's not hypoxic. He is pleasantly demented   Review of Systems:  10 point review of systems complete and all systems negative except as indicated in the history of present illness Past Medical History  Diagnosis Date  . Chronic kidney disease   . AF (paroxysmal atrial fibrillation) ([place])   . Hypertension   . Sleep apnea   . COPD (chronic obstructive pulmonary disease) ([place])   . TIA (transient ischemic attack)   . Dementia without behavioral disturbance   . CAD (coronary artery disease)   . Cardiomyopathy, ischemic   . Carotid artery occlusion   . Hyperlipidemia    Past Surgical History  Procedure Laterality Date  . Cardiac pacemaker placement    . Coronary stent placement    . Coronary artery bypass graft    . Cataract extraction    . Eye surgery      Social History:  reports that he quit smoking about 3 years ago. He has never used smokeless tobacco. He reports that he does not drink alcohol or use illicit drugs. He lives at home with his wife he ambulates independently is mild assist with ADLs Allergies  Allergen Reactions  . Morphine And Related Nausea Only  . Vicodin [Hydrocodone-Acetaminophen] Other (See Comments)    Hallucinations   . Penicillins Rash    No family history on file. discussed family medical history with wife as patient is a poor historian no reports of any cancer strokes heart attacks  Prior to Admission medications   Medication Sig Start Date End Date Taking? Authorizing Provider  apixaban (ELIQUIS) 5 MG TABS tablet Take 5 mg by mouth 2 (two) times daily.   Yes Historical Provider, MD  aspirin 81 MG chewable tablet Chew by mouth daily.   Yes Historical Provider, MD  atorvastatin (LIPITOR) 40 MG tablet Take 40 mg by mouth daily.   Yes Historical Provider, MD  Cholecalciferol (VITAMIN D3) 3000 UNITS TABS Take 1,000 Units by mouth daily.   Yes Historical Provider, MD  Cyanocobalamin (VITAMIN B 12 PO) Take by mouth daily.   Yes Historical Provider, MD  donepezil (ARICEPT) 10 MG tablet Take 10 mg by mouth at bedtime.   Yes Historical Provider, MD  famotidine (PEPCID) 20 MG tablet Take 20 mg by mouth daily.   Yes Historical Provider, MD  Fluticasone-Salmeterol (ADVAIR) 250-50 MCG/DOSE AEPB Inhale 1 puff into the lungs 2 (two) times daily.   Yes Historical Provider, MD  folic acid (FOLVITE) A999333 MCG  tablet Take 400 mcg by mouth daily.   Yes Historical Provider, MD  hydrALAZINE (APRESOLINE) 25 MG tablet Take 25 mg by mouth 3 (three) times daily. Per medication list from Dr Serita Grit Tilley's office notes: take 1/2 tablet po three times daily   Yes Historical Provider, MD  labetalol (NORMODYNE) 200 MG tablet Take 200 mg by mouth 2 (two) times daily.   Yes Historical Provider, MD  levalbuterol (XOPENEX) 1.25 MG/3ML  nebulizer solution Take 1.25 mg by nebulization as needed for wheezing.   Yes Historical Provider, MD  loratadine (CLARITIN) 10 MG tablet Take 10 mg by mouth daily.   Yes Historical Provider, MD  Memantine HCl (NAMENDA XR PO) Take 7 mg by mouth.   Yes Historical Provider, MD  Multiple Vitamin (MULTIVITAMIN) tablet Take 1 tablet by mouth daily.   Yes Historical Provider, MD  tiotropium (SPIRIVA HANDIHALER) 18 MCG inhalation capsule Place 18 mcg into inhaler and inhale daily.   Yes Historical Provider, MD  torsemide (DEMADEX) 20 MG tablet Take 20 mg by mouth daily.   Yes Historical Provider, MD  nitroGLYCERIN (NITROSTAT) 0.4 MG SL tablet Place 0.4 mg under the tongue every 5 (five) minutes as needed for chest pain.    Historical Provider, MD   Physical Exam: Filed Vitals:   01/17/16 1223 01/17/16 1500 01/17/16 1511  BP: 173/68  195/95  Pulse: 63  66  Temp: 98 F (36.7 C) 98 F (36.7 C)   TempSrc: Oral    Resp: 18  15  Height: 5\' 9"  (1.753 m)    Weight: 73.993 kg (163 lb 2 oz)    SpO2: 98%  96%    Wt Readings from Last 3 Encounters:  01/17/16 73.993 kg (163 lb 2 oz)  12/01/15 75.751 kg (167 lb)    General:  Appears calm and comfortable Eyes: PERRL, normal lids, irises & conjunctiva ENT: grossly normal hearing, lips & tongue, his membranes of his mouth are moist and pink Neck: no LAD, masses or thyromegaly Cardiovascular: RRR, no m/r/g. No LE edema. Respiratory: CTA bilaterally, no w/r/r. Normal respiratory effort. Abdomen: soft, ntnd no guarding or rebounding Skin: no rash or induration seen on limited exam Musculoskeletal: grossly normal tone BUE/BLE Psychiatric: grossly normal mood and affect, speech fluent and appropriate Neurologic: grossly non-focal. Speech clear facial symmetry oriented to self only does all extremities follows commands able to make his wants and needs known, bilateral pass slight pointing on finger-nose-finger, quick finger touches WNL, tongue/uvula midline.            Labs on Admission:  Basic Metabolic Panel:  Recent Labs Lab 01/17/16 1230  NA 140  K 5.4*  CL 104  CO2 29  GLUCOSE 103*  BUN 25*  CREATININE 1.65*  CALCIUM 10.4*   Liver Function Tests: No results for input(s): AST, ALT, ALKPHOS, BILITOT, PROT, ALBUMIN in the last 168 hours. No results for input(s): LIPASE, AMYLASE in the last 168 hours. No results for input(s): AMMONIA in the last 168 hours. CBC:  Recent Labs Lab 01/17/16 1230  WBC 5.3  HGB 10.7*  HCT 32.8*  MCV 99.7  PLT 261   Cardiac Enzymes: No results for input(s): CKTOTAL, CKMB, CKMBINDEX, TROPONINI in the last 168 hours.  BNP (last 3 results) No results for input(s): BNP in the last 8760 hours.  ProBNP (last 3 results) No results for input(s): PROBNP in the last 8760 hours.  CBG: No results for input(s): GLUCAP in the last 168 hours.  Radiological Exams on  Admission: Ct Head Wo Contrast  01/17/2016  CLINICAL DATA:  Acute onset of dizziness. EXAM: CT HEAD WITHOUT CONTRAST TECHNIQUE: Contiguous axial images were obtained from the base of the skull through the vertex without intravenous contrast. COMPARISON:  None. FINDINGS: Brain: No evidence of acute infarction, hemorrhage, extra-axial collection, ventriculomegaly, or mass effect. There is moderate brain parenchymal atrophy and deep white matter chronic small vessel disease changes. There is a more focal hypoattenuation of the subcortical white matter of the posterior right frontal lobe. There is a prior lacunar infarct of the left caudate head. Vascular: No hyperdense vessel or unexpected calcification. Skull: Negative for fracture or focal lesion. Sinuses/Orbits: Polypoid mucosal thickening of bilateral maxillary and ethmoid sinuses. Other: None. IMPRESSION: Moderate atrophy, and chronic microvascular disease. Remote lacunar infarct of the left basal ganglia. More focal hypoattenuation of the subcortical white matter of the posterior right frontal  lobe. This may represent an age-indeterminate infarct, favor subacute. Patient's prior CT exams are not available for review. Electronically Signed   By: Fidela Salisbury M.D.   On: 01/17/2016 15:23    EKG: Independently reviewed Atrial-paced rhythm Septal infarct , age undetermined ST & T wave abnormality, consider inferolateral ischemia Assessment/Plan Principal Problem:   Abnormal CT of brain Active Problems:   Hyperkalemia   Dizziness   Anemia   AKI (acute kidney injury) ([place])   COPD (chronic obstructive pulmonary disease) ([facility])   Carotid artery occlusion   Hyperlipidemia   Hypertension   AF (paroxysmal atrial fibrillation) ([facility])   Chronic kidney disease  #1. Abnormal CT of the brain. Age indeterminate infarct favoring subacute per report. Hx carotid stenosis with stent. Unable to have an MRI due to pacer. Neuro exam benign. Evaluated by neuro recommend full workup -Admit to telemetry -Continue home aspirin statin -hold BB to allow permissive htn -neuro checks -lipid panel -Hg A1c -After speaking with [place] neuro hospitalists, and discussing the fact that we would need to obtain consent from cardiology and have EP standing by in order to obtain MRI secondary to patient having a pacer Dr.Camilo stated he felt that obtaining the MRI was not that necessary in this case. Therefore we will cancel MRI/MRA brain  #2. Dizziness. Resolved on admission. See above. -prn meclazine -PT consult  #3. Hyperkalemia. Mild. -gentle iv fluids -recheck in am  #4. Chronic kidney disease. Creatinine on admission 1.65. Wife unsure of what his baseline is does confirm diagnosis of chronic kidney disease and sees nephrologist in [place] nephrotoxin -gentle fluids -repeat in am -monitor urine  5. Hypertension. Blood pressure on the high end of normal while in the emergency department.  -Hold home beta blocker continue Apresoline -Monitor closely  #6. A. fib. On  request. Chadvasc score 4. EKG with atrial paced rhythm. -Continue anticoagulation -Continue aspirin -Continue statin  #7. COPD. Stable at baseline. Not on home oxygen. -Continue home meds  neruo Code Status: full DVT Prophylaxis: Family Communication: wife at bedside Disposition Plan: home when reaqdy  Time spent: [address] during the described time interval was provided by me . I have reviewed this patient's available data, including medical history, events of note, physical examination, and all test results as part of my evaluation. I have personally reviewed and interpreted all radiology studies. I have discussed the A&P with NP Dyanne Carrel and agree with above plan.

## 2016-01-18 ENCOUNTER — Observation Stay (HOSPITAL_BASED_OUTPATIENT_CLINIC_OR_DEPARTMENT_OTHER): Payer: PPO

## 2016-01-18 ENCOUNTER — Observation Stay (HOSPITAL_COMMUNITY): Payer: PPO

## 2016-01-18 DIAGNOSIS — R930 Abnormal findings on diagnostic imaging of skull and head, not elsewhere classified: Secondary | ICD-10-CM

## 2016-01-18 DIAGNOSIS — R42 Dizziness and giddiness: Secondary | ICD-10-CM

## 2016-01-18 DIAGNOSIS — I480 Paroxysmal atrial fibrillation: Secondary | ICD-10-CM

## 2016-01-18 DIAGNOSIS — D649 Anemia, unspecified: Secondary | ICD-10-CM | POA: Diagnosis not present

## 2016-01-18 DIAGNOSIS — J410 Simple chronic bronchitis: Secondary | ICD-10-CM

## 2016-01-18 DIAGNOSIS — I6529 Occlusion and stenosis of unspecified carotid artery: Secondary | ICD-10-CM

## 2016-01-18 DIAGNOSIS — I6789 Other cerebrovascular disease: Secondary | ICD-10-CM

## 2016-01-18 DIAGNOSIS — N182 Chronic kidney disease, stage 2 (mild): Secondary | ICD-10-CM

## 2016-01-18 LAB — CBC
HCT: 29.8 % — ABNORMAL LOW (ref 39.0–52.0)
Hemoglobin: 9.9 g/dL — ABNORMAL LOW (ref 13.0–17.0)
MCH: 33 pg (ref 26.0–34.0)
MCHC: 33.2 g/dL (ref 30.0–36.0)
MCV: 99.3 fL (ref 78.0–100.0)
Platelets: 226 10*3/uL (ref 150–400)
RBC: 3 MIL/uL — ABNORMAL LOW (ref 4.22–5.81)
RDW: 14.3 % (ref 11.5–15.5)
WBC: 5.9 10*3/uL (ref 4.0–10.5)

## 2016-01-18 LAB — HEMOGLOBIN A1C
Hgb A1c MFr Bld: 5.8 % — ABNORMAL HIGH (ref 4.8–5.6)
Mean Plasma Glucose: 120 mg/dL

## 2016-01-18 LAB — BASIC METABOLIC PANEL
Anion gap: 10 (ref 5–15)
BUN: 22 mg/dL — ABNORMAL HIGH (ref 6–20)
CO2: 23 mmol/L (ref 22–32)
Calcium: 9.4 mg/dL (ref 8.9–10.3)
Chloride: 106 mmol/L (ref 101–111)
Creatinine, Ser: 1.52 mg/dL — ABNORMAL HIGH (ref 0.61–1.24)
GFR calc Af Amer: 52 mL/min — ABNORMAL LOW (ref >60)
GFR calc non Af Amer: 45 mL/min — ABNORMAL LOW (ref >60)
Glucose, Bld: 100 mg/dL — ABNORMAL HIGH (ref 65–99)
Potassium: 3.9 mmol/L (ref 3.5–5.1)
Sodium: 139 mmol/L (ref 135–145)

## 2016-01-18 MED ORDER — ALBUTEROL SULFATE (2.5 MG/3ML) 0.083% IN NEBU: 2.5000 mg | INHALATION_SOLUTION | Freq: Four times a day (QID) | RESPIRATORY_TRACT | Status: DC | PRN

## 2016-01-18 NOTE — Progress Notes (Signed)
*  PRELIMINARY RESULTS* Echocardiogram 2D Echocardiogram has been performed.  Joe Wiggins 01/18/2016, 9:31 AM

## 2016-01-18 NOTE — Progress Notes (Signed)
STROKE TEAM PROGRESS NOTE   HISTORY OF PRESENT ILLNESS Joe Wiggins is an 70 y.o. 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 Patient was in his usual state of health until earlier today when started having a nosebleed and feeling " dizzy, swimmy headed, kind of off balance". That sensation was not really vertiginous and lasted for approximately 30 minutes. No associated HA, double vision, difficulty swallowing, slurred speech, language or vision impairment. CT head reveals " more focal hypoattenuation subcortical white matter of the posterior right frontal lobe". He was last known well 01/12/2016, time unknown. Patient was not administered TPA secondary to symptoms resolved. He was admitted for further evaluation and treatment.   SUBJECTIVE (INTERVAL HISTORY)   OBJECTIVE Temp:  [98 F (36.7 C)-98.9 F (37.2 C)] 98.9 F (37.2 C) (01/23 0949) Pulse Rate:  [59-79] 63 (01/23 0949) Cardiac Rhythm:  [-] Normal sinus rhythm;Atrial paced (01/23 0736) Resp:  [14-20] 16 (01/23 0949) BP: (129-204)/(53-95) 156/70 mmHg (01/23 0949) SpO2:  [96 %-99 %] 97 % (01/23 0949) Weight:  [73.993 kg (163 lb 2 oz)] 73.993 kg (163 lb 2 oz) (01/22 1223)  CBC:  Recent Labs Lab 01/17/16 1230 01/18/16 0508  WBC 5.3 5.9  HGB 10.7* 9.9*  HCT 32.8* 29.8*  MCV 99.7 99.3  PLT 261 [id number]    Basic Metabolic Panel:  Recent Labs Lab 01/17/16 1230 01/18/16 0508  NA 140 139  K 5.4* 3.9  CL 104 106  CO2 29 23  GLUCOSE 103* 100*  BUN 25* 22*  CREATININE 1.65* 1.52*  CALCIUM 10.4* 9.4    Lipid Panel:    Component Value Date/Time   CHOL 129 01/17/2016 2050   TRIG 123 01/17/2016 2050   HDL 28* 01/17/2016 2050   CHOLHDL 4.6 01/17/2016 2050   VLDL 25 01/17/2016 2050    LDLCALC 76 01/17/2016 2050   HgbA1c:  Lab Results  Component Value Date   HGBA1C 5.8* 01/17/2016   Urine Drug Screen: No results found for: LABOPIA, COCAINSCRNUR, LABBENZ, AMPHETMU, THCU, LABBARB    IMAGING  Dg Chest 2 View 01/17/2016   New opacity in the left lung base with small left pleural effusion or thickening. Findings may indicate pneumonia. Followup PA and lateral chest X-ray is recommended in 3-4 weeks following trial of antibiotic therapy to ensure resolution and exclude underlying malignancy. Diffuse emphysematous change.   Ct Head Wo Contrast 01/17/2016  Moderate atrophy, and chronic microvascular disease. Remote lacunar infarct of the left basal ganglia. More focal hypoattenuation of the subcortical white matter of the posterior right frontal lobe. This may represent an age-indeterminate infarct, favor subacute. Patient's prior CT exams are not available for review.   Carotid Doppler   Known right ICA occlusion. There is a moderate stenosis noted in the left ICA with stent narrowing noted (50-75%).  2D Echocardiogram  - Left ventricle: The cavity size was normal. There was mild tomoderate concentric hypertrophy. Systolic function was normal.The estimated ejection fraction was in the range of 50% to 55%.Wall motion was normal; there were no regional wall motionabnormalities. Features are consistent with a pseudonormal leftventricular filling pattern, with concomitant abnormal relaxationand increased filling pressure (grade 2 diastolic dysfunction).Doppler parameters are consistent with high ventricular fillingpressure. - Mitral valve: There was mild regurgitation. - Left atrium: The atrium was mildly  dilated. - Right ventricle: The cavity size was normal. Wall thickness wasnormal. Systolic function was normal. - Tricuspid valve: There was mild regurgitation. - Pulmonary arteries: Systolic pressure was mildly increased. PApeak pressure: 40 mm Hg (S).   PHYSICAL  EXAM Pleasant elderly Caucasian male sitting comfortably in bed. Not in distress.  . Afebrile. Head is nontraumatic. Neck is supple without bruit.    Cardiac exam no murmur or gallop. Lungs are clear to auscultation. Distal pulses are well felt. Neurological Exam ;  Awake  Alert oriented x 3. Normal speech and language.impaired registration, recall and poor insight into his condition. Follows simple one and symptoms two-step commands. eye movements full without nystagmus.fundi were not visualized. Vision acuity and fields appear normal. Hearing is normal. Palatal movements are normal. Face symmetric. Tongue midline. Normal strength, tone, reflexes and coordination. Normal sensation. Gait deferred. ASSESSMENT/PLAN Joe Wiggins is a 70 y.o. 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 He did not receive IV t-PA due to symptoms resolved.   Stroke vs TIA:  secondary to cardiogenic embolism from atrial fibrillation.  baseline mild dementia  MRI  / MRA  Pacer  Initial CT with possible posterior right frontal lobe lesion  CT of head to confirm stroke, (no CTA due to elevated Cr to look at vasculature)  Carotid Doppler  R ICA occlusion, L ICA stent 50-75%  2D Echo  No source of embolus   LDL 76  HgbA1c 5.8  eliquis for VTE prophylaxis  Diet Heart Room service appropriate?: Yes; Fluid consistency:: Thin  Eliquis (apixaban) daily prior to admission, now on Eliquis (apixaban) daily  Patient counseled to be compliant with his antithrombotic medications  Ongoing aggressive stroke risk factor management  Therapy recommendations:  No therapy needs  Disposition:  Return home  Right carotid occlusion  Atrial Fibrillation  Home anticoagulation:  eliquis continued in the hospital  Epistaxis, Hgb  10.7->9.9  Continue eliquis at discharge    Hypertension  Stable  Hyperlipidemia  Home meds:  lipitor 40, resumed in hospital  LDL 76, goal < 70  Continue statin at discharge  Other Stroke Risk Factors  Advanced age  Former Cigarette smoker, quit smoking 3 years ago   Hx TIA  Coronary artery disease Status post CABG, stent  Obstructive sleep apnea, on CPAP at home  Other Active Problems  Baseline dementia  Hyperkalemia  Chronic kidney disease  cOPD  Hospital day #   [facility] See Amion for Pager information 01/18/2016 3:53 PM  I have personally examined this patient, reviewed notes, independently viewed imaging studies, participated in medical decision making and plan of care. I have made any additions or clarifications directly to the above note. Agree with note above.  He presented with dizziness in the setting of mild epistaxis without any focal neurological deficits. CT scan of the head shows questionable hypodensity in the right basal ganglia possibly an age-indeterminate infarct. He does have old lacunar infarct in left basal ganglia on CT scan as well. MRI cannot be obtained due to pacemaker. He remains at risk for recurrent stroke, TIA, neurological worsening and needs ongoing stroke evaluation. Had a long discussion of the bedside with the patient's wife and answered questions.  Antony Contras, MD Medical Director [facility] Pager: [phone] 01/18/2016 4:34 PM    To  contact Stroke Continuity provider, please refer to [email or web address] After hours, contact General Neurology

## 2016-01-18 NOTE — Evaluation (Addendum)
Occupational Therapy Evaluation Patient Details Name: Joe Wiggins MRN: [id number] DOB: 1946-10-06 Today's Date: 01/18/2016    History of Present Illness Joe Wiggins is an 70 y.o. 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 CT head with appearance Rt posterior frontal CVA (age-indeterminate). Remote lacunar infarct of left basal ganglia.   Clinical Impression   Pt admitted with above. Wife provided supervision for tub transfer prior to admission. Education provided in session and OT signing off.    Follow Up Recommendations  No OT follow up;Supervision/Assistance - 24 hour    Equipment Recommendations  None recommended by OT    Recommendations for Other Services       Precautions / Restrictions Precautions Precautions: None Restrictions Weight Bearing Restrictions: No      Mobility              Transfers Overall transfer level: see comment below Equipment used: None             General transfer comment: OT assisted pt to sit trunk upright in chair as he was reclined back. Pt able to stand independently    Balance Pt able to pick up items off of floor without LOB. Decreased standing balance (without UE support) with single leg stance when simulating LB bathing, but no LOB when pt simulated activity with UE supported with single leg stance.    ADL Overall ADL's : Needs assistance/impaired               Lower Body Bathing Details (indicate cue type and reason): simulated standing-Min guard without UE support; Supervision with UE support     Lower Body Dressing: Sit to/from stand; Supervision (supervision to gather items)   Toilet Transfer: Modified Independent   Toileting- Clothing Manipulation and Hygiene: Modified  Independent   Tub/ Shower Transfer: Tub transfer;Ambulation (Min guard-supervision)   Functional mobility during ADLs:  (Mod I-walking in room;Min guard-supervision for tub transfer) General ADL Comments: Suggested use of calendar at home to help pt with orientation and also writing things down to help compensate for memory. Recommended spouse be with him for tub transfer and bathing initially. Spoke about pt reading at home to work on it. Recommended pt hold to support when washing his LB.     Vision Pt wears glasses Vision Assessment?: Yes Visual Fields: No apparent deficits Additional Comments: a little difficulty reading but wife reports pt does not reach much; pt does not drive   Perception     Praxis      Pertinent Vitals/Pain Pain Assessment: No/denies pain     Hand Dominance Right   Extremity/Trunk Assessment Upper Extremity Assessment Upper Extremity Assessment: LUE deficits/detail LUE Deficits / Details: Lt shoulder flexors weaker than right LUE Coordination: decreased fine motor   Lower Extremity Assessment Lower Extremity Assessment: Defer to PT evaluation (reports residual weakness in LLE)   Cervical / Trunk Assessment Cervical / Trunk Assessment: Normal   Communication Communication Communication: HOH   Cognition Arousal/Alertness: Awake/alert Behavior During Therapy: WFL for tasks assessed/performed Overall Cognitive Status: History of cognitive impairments - at baseline (not oriented to year)                     General Comments       Exercises Exercises: Other exercises Other Exercises  Other Exercises: educated on fine motor coordination exercises/activities for Lt hand. Educated on theraband exercises for left shoulder and gave pt level 2 theraband and pt performed some shoulder exercises with band.    Shoulder Instructions      Home Living Family/patient expects to be discharged to:: Private residence Living Arrangements:  Spouse/significant other Available Help at Discharge: Family;Available 24 hours/day Type of Home: House Home Access: Level entry     Home Layout: One level     Bathroom Shower/Tub: Tub/shower unit;Curtain   Biochemist, clinical: Standard     Home Equipment: Environmental consultant - 2 wheels;Shower seat;Grab bars - tub/shower;Grab bars - toilet;Wheelchair - manual   Additional Comments: most equipment given to him      Prior Functioning/Environment Level of Independence: Needs assistance (wife provides supervision for tub transfer)            OT Diagnosis: Cognitive deficits;Other (comment) (weakness)   OT Problem List: Decreased strength;Decreased coordination;Decreased cognition;Impaired balance (sitting and/or standing)   OT Treatment/Interventions:      OT Goals(Current goals can be found in the care plan section) Acute Rehab OT Goals Patient Stated Goal: go home  OT Frequency:     Barriers to D/C:            Co-evaluation              End of Session Equipment Utilized During Treatment: Other (comment) (theraband)  Activity Tolerance: Patient tolerated treatment well Patient left: in chair;with call bell/phone within reach;with chair alarm set;with family/visitor present   Time: [id number] OT Time Calculation (min): 21 min Charges:  OT General Charges $OT Visit: 1 Procedure OT Evaluation $OT Eval Moderate Complexity: 1 Procedure G-Codes: OT G-codes **NOT FOR INPATIENT CLASS** Functional Assessment Tool Used: clinical judgment Functional Limitation: Self care Self Care Current Status [id number]): At least 1 percent but less than 20 percent impaired, limited or restricted Self Care Goal Status [id number]): At least 1 percent but less than 20 percent impaired, limited or restricted Self Care Discharge Status [phone]): At least 1 percent but less than 20 percent impaired, limited or restricted  Benito Mccreedy OTR/L [id number] 01/18/2016, 12:59 PM

## 2016-01-18 NOTE — Discharge Instructions (Signed)

## 2016-01-18 NOTE — Progress Notes (Signed)
VASCULAR LAB PRELIMINARY  PRELIMINARY  PRELIMINARY  PRELIMINARY  Carotid duplex completed.    Preliminary report:  Known right ICA occlusion.  There is a moderate stenosis noted in the left ICA with stent narrowing noted (50-75%).  Norris Bodley, RVT 01/18/2016, 10:18 AM

## 2016-01-18 NOTE — Progress Notes (Signed)
C/o of sudden numbness of left fingers after blood drawn; pt states has improved some. No other new neurological symptoms. MD made aware.

## 2016-01-18 NOTE — Evaluation (Signed)
Physical Therapy Evaluation and Discharge Patient Details Name: Joe Wiggins MRN: [id number] DOB: Aug 14, 1946 Today's Date: 01/18/2016   History of Present Illness  Joe Wiggins is an 70 y.o. 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 CT head with appearance Rt posterior frontal CVA (age-indeterminate)    Clinical Impression  Patient evaluated by Physical Therapy with no further acute PT needs identified. Patient with no overt unsteadiness or imbalance. Reports he is at his baseline. PT is signing off. Thank you for this referral.     Follow Up Recommendations No PT follow up    Equipment Recommendations  None recommended by PT    Recommendations for Other Services       Precautions / Restrictions Precautions Precautions: None Restrictions Weight Bearing Restrictions: No      Mobility  Bed Mobility                  Transfers Overall transfer level: Independent Equipment used: None                Ambulation/Gait Ambulation/Gait assistance: Supervision;Modified independent (Device/Increase time) Ambulation Distance (Feet): 220 Feet Assistive device: None Gait Pattern/deviations: Step-through pattern;Drifts right/left   Gait velocity interpretation: Below normal speed for age/gender General Gait Details: initial supervision due to drifting Lt/Rt with head turns (first time walking in hall since admission per pt); progressed to modified independent (decr velocity)  Stairs            Wheelchair Mobility    Modified Rankin (Stroke Patients Only) Modified Rankin (Stroke Patients Only) Pre-Morbid Rankin Score: No significant disability Modified Rankin: No significant disability     Balance Overall balance assessment:  Independent         Standing balance support: No upper extremity supported Standing balance-[name]: Normal   Single Leg Stance - Right Leg: 5 Single Leg Stance - Left Leg: 2   Tandem Stance - Left Leg: 15 Rhomberg - Eyes Opened: 30 Rhomberg - Eyes Closed: 15 High level balance activites: Direction changes;Turns;Sudden stops;Head turns High Level Balance Comments: slight drift with head turns; no loss of balance             Pertinent Vitals/Pain Pain Assessment: No/denies pain    Home Living Family/patient expects to be discharged to:: Private residence Living Arrangements: Spouse/significant other Available Help at Discharge: Family;Available 24 hours/day Type of Home: House Home Access: Level entry     Home Layout: One level Home Equipment: Walker - 2 wheels;Shower seat;Grab bars - tub/shower;Grab bars - toilet;Wheelchair - manual Additional Comments: most equipment given to him    Prior Function Level of Independence: Independent               Hand Dominance   Dominant Hand: Right    Extremity/Trunk Assessment   Upper Extremity Assessment: Defer to OT evaluation;Overall WFL for tasks assessed           Lower Extremity Assessment: Overall WFL for tasks assessed (LLE slightly weaker than R)      Cervical / Trunk Assessment: Normal  Communication   Communication: HOH  Cognition Arousal/Alertness: Awake/alert Behavior During Therapy: WFL for tasks assessed/performed Overall Cognitive Status: No family/caregiver present to determine baseline cognitive functioning (WFL during session; noted h/o dementia)  General Comments      Exercises        Assessment/Plan    PT Assessment Patent does not need any further PT services  PT Diagnosis Difficulty walking   PT Problem List    PT Treatment Interventions     PT Goals (Current goals can be found in the Care Plan section) Acute Rehab PT Goals PT Goal  Formulation: All assessment and education complete, DC therapy    Frequency     Barriers to discharge        Co-evaluation               End of Session Equipment Utilized During Treatment: Gait belt Activity Tolerance: Patient tolerated treatment well Patient left: in chair;with call bell/phone within reach;with chair alarm set;with family/visitor present Nurse Communication: Mobility status (d/c from PT)    Functional Assessment Tool Used: clinical judgement Functional Limitation: Mobility: Walking and moving around Mobility: Walking and Moving Around Current Status [id number]): At least 1 percent but less than 20 percent impaired, limited or restricted Mobility: Walking and Moving Around Goal Status [phone]): At least 1 percent but less than 20 percent impaired, limited or restricted Mobility: Walking and Moving Around Discharge Status [phone]): At least 1 percent but less than 20 percent impaired, limited or restricted    Time: 1053-1108 PT Time Calculation (min) (ACUTE ONLY): 15 min   Charges:   PT Evaluation $PT Eval Low Complexity: 1 Procedure     PT G Codes:   PT G-Codes **NOT FOR INPATIENT CLASS** Functional Assessment Tool Used: clinical judgement Functional Limitation: Mobility: Walking and moving around Mobility: Walking and Moving Around Current Status [id number]): At least 1 percent but less than 20 percent impaired, limited or restricted Mobility: Walking and Moving Around Goal Status [phone]): At least 1 percent but less than 20 percent impaired, limited or restricted Mobility: Walking and Moving Around Discharge Status [phone]): At least 1 percent but less than 20 percent impaired, limited or restricted    Joe Wiggins 01/18/2016, 11:59 AM Pager [phone]

## 2016-01-18 NOTE — Progress Notes (Signed)
[facility]                                                                              Patient Demographics  Joe Wiggins, is a 70 y.o. male, DOB - 1946/05/18, [id number]  Admit date - 01/17/2016   Admitting Physician Allie Bossier, MD  Outpatient Primary MD for the patient is Leota Jacobsen, MD  LOS -    Chief Complaint  Patient presents with  . Dizziness    Nose bleed and HTN       Brief HPI   Joe Wiggins is a very pleasant 70 y.o. male past medical history that includes chronic kidney disease, A. fib, hypertension, COPD, dementia, CAD, lipidemia presents emergency Department chief complaint intermittent dizziness, mild slow nosebleed. Patient has mild-to-moderate dementia. He was in his usual state of health when he developed epistaxis which lasted about 30 minutes. He also had dizziness and unsteady gait which persisted about 15-30 minutes. CT head showed moderate focal hypoattenuation of the subcortical white matter of the posterior right frontal lobe, possible subacute infarct. Neurology was consulted. Patient was admitted for further workup.   Assessment & Plan    Principal problem Dizziness : Improved, subacute CVA, on eliquis - Unable to get MRI/MRA due to pacemaker - CT head showed moderate focal hypoattenuation of the subcortical white matter of the posterior right frontal lobe, possible subacute infarct.  Repeat CT head versus CT angiogram of the head and neck? Awaiting neuro recommendations. - 2-D echo showed EF 50-55% with grade 2 diastolic dysfunction - Carotid Dopplers showed known right sided ICA occlusion, 50-75% left ICA occlusion - Neurology following, will await further recommendations - Lipid panel showed LDL 76, goal less than 70, placed on statin - Hemoglobin A1c 5.8 - PT evaluation recommending no PT follow-up  Hyperkalemia. Mild. -Resolved   Chronic kidney disease. Creatinine on admission 1.65. Wife unsure of what his  baseline, patient has CK D, sees nephrology outpatient in [place] -Creatinine function improving, continue gentle hydration  . Hypertension. Blood pressure on the high end of normal while in the emergency department.  -Hold home beta blocker continue Apresoline -Monitor closely   A. fib.  -Rate controlled, Chadvasc score 4.  -Continue apixaban -Continue statin   COPD. Stable at baseline. Not on home oxygen. -Continue home meds  Dementia - Currently at baseline, continue Aricept  Code Status: Full code  Family Communication: Discussed in detail with the patient, all imaging results, lab results explained to the patient and wife at the bedside   Disposition Plan: Hopefully DC home in a.m.  Time Spent in minutes  30minutes  Procedures  CT head 2-D echo Carotid Dopplers  Consults   Neurology  DVT Prophylaxis  apixaban  Medications  Scheduled Meds: .  stroke: mapping our early stages of recovery book   Does not apply Once  . apixaban  5 mg Oral BID  . aspirin  81 mg Oral Daily  . atorvastatin  40 mg Oral Daily  . donepezil  10 mg Oral QHS  . famotidine  20 mg Oral Daily  . folic acid  0.5 mg Oral Daily  .  hydrALAZINE  25 mg Oral TID  . mometasone-formoterol  2 puff Inhalation BID  . sodium chloride  3 mL Intravenous Q12H  . tiotropium  18 mcg Inhalation Daily  . torsemide  20 mg Oral Daily   Continuous Infusions:  PRN Meds:.albuterol, alum & mag hydroxide-simeth, hydrALAZINE, ondansetron **OR** ondansetron (ZOFRAN) IV, polyethylene glycol, senna-docusate   Antibiotics   Anti-infectives    None        Subjective:   Joe Wiggins was seen and examined today.  Feels fine now, no nosebleed, no dizziness or lightheadedness. Patient denies dizziness, chest pain, shortness of breath, abdominal pain, N/V/D/C, new weakness, numbess, tingling. No acute events overnight.    Objective:   Blood pressure 156/70, pulse 63, temperature 98.9 F (37.2 C),  temperature source Oral, resp. rate 16, height 5\' 9"  (1.753 m), weight 73.993 kg (163 lb 2 oz), SpO2 97 %.  Wt Readings from Last 3 Encounters:  01/17/16 73.993 kg (163 lb 2 oz)  12/01/15 75.751 kg (167 lb)     Intake/Output Summary (Last 24 hours) at 01/18/16 1506 Last data filed at 01/18/16 1032  Gross per 24 hour  Intake    123 ml  Output      0 ml  Net    123 ml    Exam  General: Alert and oriented x 3, NAD  HEENT:  PERRLA, EOMI, Anicteric Sclera, mucous membranes moist.   Neck: Supple, no JVD, no masses  CVS: S1 S2 auscultated, no rubs, murmurs or gallops. Regular rate and rhythm.  Respiratory: Clear to auscultation bilaterally, no wheezing, rales or rhonchi  Abdomen: Soft, nontender, nondistended, + bowel sounds  Ext: no cyanosis clubbing or edema  Neuro: AAOx3, Cr N's II- XII. Strength 5/5 upper and lower extremities bilaterally  Skin: No rashes  Psych: Normal affect and demeanor, alert and oriented x3    Data Review   Micro Results No results found for this or any previous visit (from the past 240 hour(s)).  Radiology Reports Dg Chest 2 View  01/17/2016  CLINICAL DATA:  Patient had an episode of nose bleed and dizzy feeling today. Sensation lasted about 30 minutes. History of COPD, CHF, and strokes. EXAM: CHEST  2 VIEW COMPARISON:  02/20/2013 FINDINGS: Postoperative changes in the mediastinum. Cardiac pacemaker. Vascular stent in the left neck. Normal heart size and pulmonary vascularity. New since the previous study, there is focal opacity in the left costophrenic angle with associated pleural fluid or thickening. This may indicate pneumonia. Followup PA and lateral chest X-ray is recommended in 3-4 weeks following trial of antibiotic therapy to ensure resolution and exclude underlying malignancy. Diffuse emphysematous changes in the lungs. Calcification of the aorta. Degenerative changes in the spine and shoulders. IMPRESSION: New opacity in the left lung base  with small left pleural effusion or thickening. Findings may indicate pneumonia. Followup PA and lateral chest X-ray is recommended in 3-4 weeks following trial of antibiotic therapy to ensure resolution and exclude underlying malignancy. Diffuse emphysematous change. Electronically Signed   By: Lucienne Capers M.D.   On: 01/17/2016 18:37   Ct Head Wo Contrast  01/17/2016  CLINICAL DATA:  Acute onset of dizziness. EXAM: CT HEAD WITHOUT CONTRAST TECHNIQUE: Contiguous axial images were obtained from the base of the skull through the vertex without intravenous contrast. COMPARISON:  None. FINDINGS: Brain: No evidence of acute infarction, hemorrhage, extra-axial collection, ventriculomegaly, or mass effect. There is moderate brain parenchymal atrophy and deep white matter chronic small vessel disease changes. There  is a more focal hypoattenuation of the subcortical white matter of the posterior right frontal lobe. There is a prior lacunar infarct of the left caudate head. Vascular: No hyperdense vessel or unexpected calcification. Skull: Negative for fracture or focal lesion. Sinuses/Orbits: Polypoid mucosal thickening of bilateral maxillary and ethmoid sinuses. Other: None. IMPRESSION: Moderate atrophy, and chronic microvascular disease. Remote lacunar infarct of the left basal ganglia. More focal hypoattenuation of the subcortical white matter of the posterior right frontal lobe. This may represent an age-indeterminate infarct, favor subacute. Patient's prior CT exams are not available for review. Electronically Signed   By: Fidela Salisbury M.D.   On: 01/17/2016 15:23    CBC  Recent Labs Lab 01/17/16 1230 01/18/16 0508  WBC 5.3 5.9  HGB 10.7* 9.9*  HCT 32.8* 29.8*  PLT 261 226  MCV 99.7 99.3  MCH 32.5 33.0  MCHC 32.6 33.2  RDW 14.3 14.3    Chemistries   Recent Labs Lab 01/17/16 1230 01/18/16 0508  NA 140 139  K 5.4* 3.9  CL 104 106  CO2 29 23  GLUCOSE 103* 100*  BUN 25* 22*    CREATININE 1.65* 1.52*  CALCIUM 10.4* 9.4   ------------------------------------------------------------------------------------------------------------------ estimated creatinine clearance is 45.2 mL/min (by C-G formula based on Cr of 1.52). ------------------------------------------------------------------------------------------------------------------  Recent Labs  01/17/16 1230  HGBA1C 5.8*   ------------------------------------------------------------------------------------------------------------------  Recent Labs  01/17/16 2050  CHOL 129  HDL 28*  LDLCALC 76  TRIG 123  CHOLHDL 4.6   ------------------------------------------------------------------------------------------------------------------  Recent Labs  01/17/16 2050  TSH 0.574   ------------------------------------------------------------------------------------------------------------------ No results for input(s): VITAMINB12, FOLATE, FERRITIN, TIBC, IRON, RETICCTPCT in the last 72 hours.  Coagulation profile No results for input(s): INR, PROTIME in the last 168 hours.  No results for input(s): DDIMER in the last 72 hours.  Cardiac Enzymes  Recent Labs Lab 01/17/16 2050  TROPONINI <0.03   ------------------------------------------------------------------------------------------------------------------ Invalid input(s): POCBNP  No results for input(s): GLUCAP in the last 72 hours.   Keyani Rigdon M.D. [facility] 01/18/2016, 3:06 PM  Pager: [phone] Between 7am to 7pm - call Pager - [phone]  After 7pm go to [email or web address] - password [email or web address]  Call night coverage person covering after 7pm

## 2016-01-18 NOTE — Progress Notes (Signed)
   01/18/16 2009  BiPAP/CPAP/SIPAP  BiPAP/CPAP/SIPAP Pt Type Adult  Mask Type Full face mask  Mask Size Medium  Set Rate 0 breaths/min  Respiratory Rate 16 breaths/min  IPAP 5 cmH20  EPAP 5 cmH2O  Oxygen Percent 21 %  Flow Rate 0 lpm  BiPAP/CPAP/SIPAP CPAP  Patient Home Equipment No  Auto Titrate No  BiPAP/CPAP /SiPAP Vitals  Pulse Rate 72  Resp 16  Bilateral Breath Sounds Clear;Diminished

## 2016-01-19 DIAGNOSIS — I480 Paroxysmal atrial fibrillation: Secondary | ICD-10-CM | POA: Diagnosis not present

## 2016-01-19 DIAGNOSIS — R930 Abnormal findings on diagnostic imaging of skull and head, not elsewhere classified: Secondary | ICD-10-CM | POA: Diagnosis not present

## 2016-01-19 DIAGNOSIS — D649 Anemia, unspecified: Secondary | ICD-10-CM | POA: Diagnosis not present

## 2016-01-19 DIAGNOSIS — I6529 Occlusion and stenosis of unspecified carotid artery: Secondary | ICD-10-CM | POA: Diagnosis not present

## 2016-01-19 LAB — BASIC METABOLIC PANEL
Anion gap: 10 (ref 5–15)
BUN: 19 mg/dL (ref 6–20)
CO2: 23 mmol/L (ref 22–32)
Calcium: 9.6 mg/dL (ref 8.9–10.3)
Chloride: 107 mmol/L (ref 101–111)
Creatinine, Ser: 1.63 mg/dL — ABNORMAL HIGH (ref 0.61–1.24)
GFR calc Af Amer: 48 mL/min — ABNORMAL LOW (ref >60)
GFR calc non Af Amer: 41 mL/min — ABNORMAL LOW (ref >60)
Glucose, Bld: 106 mg/dL — ABNORMAL HIGH (ref 65–99)
Potassium: 3.8 mmol/L (ref 3.5–5.1)
Sodium: 140 mmol/L (ref 135–145)

## 2016-01-19 NOTE — Progress Notes (Signed)
Patient being discharged home with wife, discharge summary reviewed and IMMI will be offered, IV removed follow up appointments and medications discussed, will transport off floor with wheel chair he is alert and back to base line.

## 2016-01-19 NOTE — Care Management Note (Signed)
Case Management Note  Patient Details  Name: Joe Wiggins MRN: [id number] Date of Birth: Sep 24, 1946  Subjective/Objective:                    Action/Plan: Patient is discharging home today with self care. No follow up per PT/OT. No further needs per CM.   Expected Discharge Date:                  Expected Discharge Plan:  Home/Self Care  In-House Referral:     Discharge planning Services     Post Acute Care Choice:    Choice offered to:     DME Arranged:    DME Agency:     HH Arranged:    [place] Agency:     Status of Service:  Completed, signed off  Medicare Important Message Given:    Date Medicare IM Given:    Medicare IM give by:    Date Additional Medicare IM Given:    Additional Medicare Important Message give by:     If discussed at [place] of Stay Meetings, dates discussed:    Additional Comments:  Pollie Friar, RN 01/19/2016, 11:06 AM

## 2016-01-19 NOTE — Progress Notes (Signed)
STROKE TEAM PROGRESS NOTE   HISTORY OF PRESENT ILLNESS Joe Wiggins is an 70 y.o. 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 Patient was in his usual state of health until earlier today when started having a nosebleed and feeling " dizzy, swimmy headed, kind of off balance". That sensation was not really vertiginous and lasted for approximately 30 minutes. No associated HA, double vision, difficulty swallowing, slurred speech, language or vision impairment. CT head reveals " more focal hypoattenuation subcortical white matter of the posterior right frontal lobe". He was last known well 01/12/2016, time unknown. Patient was not administered TPA secondary to symptoms resolved. He was admitted for further evaluation and treatment.   SUBJECTIVE (INTERVAL HISTORY) Patient is stable and has no complaints. No further epistaxis.  OBJECTIVE Temp:  [98.4 F (36.9 C)-98.8 F (37.1 C)] 98.8 F (37.1 C) (01/24 0945) Pulse Rate:  [72-85] 80 (01/24 0945) Cardiac Rhythm:  [-] Normal sinus rhythm (01/24 0759) Resp:  [16-17] 16 (01/24 0945) BP: (138-187)/(62-74) 163/74 mmHg (01/24 0945) SpO2:  [97 %-98 %] 98 % (01/24 0945)  CBC:   Recent Labs Lab 01/17/16 1230 01/18/16 0508  WBC 5.3 5.9  HGB 10.7* 9.9*  HCT 32.8* 29.8*  MCV 99.7 99.3  PLT 261 [id number]    Basic Metabolic Panel:   Recent Labs Lab 01/18/16 0508 01/19/16 0555  NA 139 140  K 3.9 3.8  CL 106 107  CO2 23 23  GLUCOSE 100* 106*  BUN 22* 19  CREATININE 1.52* 1.63*  CALCIUM 9.4 9.6    Lipid Panel:     Component Value Date/Time   CHOL 129 01/17/2016 2050   TRIG 123 01/17/2016 2050   HDL 28* 01/17/2016 2050   CHOLHDL 4.6 01/17/2016 2050   VLDL 25 01/17/2016 2050   LDLCALC 76 01/17/2016  2050   HgbA1c:  Lab Results  Component Value Date   HGBA1C 5.8* 01/17/2016   Urine Drug Screen: No results found for: LABOPIA, COCAINSCRNUR, LABBENZ, AMPHETMU, THCU, LABBARB    IMAGING  Dg Chest 2 View 01/17/2016   New opacity in the left lung base with small left pleural effusion or thickening. Findings may indicate pneumonia. Followup PA and lateral chest X-ray is recommended in 3-4 weeks following trial of antibiotic therapy to ensure resolution and exclude underlying malignancy. Diffuse emphysematous change.   Ct Head Wo Contrast 01/17/2016  Moderate atrophy, and chronic microvascular disease. Remote lacunar infarct of the left basal ganglia. More focal hypoattenuation of the subcortical white matter of the posterior right frontal lobe. This may represent an age-indeterminate infarct, favor subacute. Patient's prior CT exams are not available for review.   Carotid Doppler   Known right ICA occlusion. There is a moderate stenosis noted in the left ICA with stent narrowing noted (50-75%).  2D Echocardiogram  - Left ventricle: The cavity size was normal. There was mild tomoderate concentric hypertrophy. Systolic function was normal.The estimated ejection fraction was in the range of 50% to 55%.Wall motion was normal; there were no regional wall motionabnormalities. Features are consistent with a pseudonormal leftventricular filling pattern, with concomitant abnormal relaxationand increased filling pressure (grade 2 diastolic dysfunction).Doppler parameters are consistent with high ventricular fillingpressure. - Mitral valve: There was mild regurgitation. - Left atrium: The atrium was mildly dilated. - Right ventricle: The  cavity size was normal. Wall thickness wasnormal. Systolic function was normal. - Tricuspid valve: There was mild regurgitation. - Pulmonary arteries: Systolic pressure was mildly increased. PApeak pressure: 40 mm Hg (S).   PHYSICAL EXAM Pleasant elderly  Caucasian male sitting comfortably in bed. Not in distress.  . Afebrile. Head is nontraumatic. Neck is supple without bruit.    Cardiac exam no murmur or gallop. Lungs are clear to auscultation. Distal pulses are well felt. Neurological Exam ;  Awake  Alert oriented x 3. Normal speech and language.impaired registration, recall and poor insight into his condition. Follows simple one and symptoms two-step commands. eye movements full without nystagmus.fundi were not visualized. Vision acuity and fields appear normal. Hearing is normal. Palatal movements are normal. Face symmetric. Tongue midline. Normal strength, tone, reflexes and coordination. Normal sensation. Gait deferred. ASSESSMENT/PLAN [name] is a 70 y.o. 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 He did not receive IV t-PA due to symptoms resolved.   Stroke vs TIA:  secondary to cardiogenic embolism from atrial fibrillation.  baseline mild dementia  MRI  / MRA  Pacer  Initial CT with possible posterior right frontal lobe lesion  CT of head to confirm stroke, (no CTA due to elevated Cr to look at vasculature)  Carotid Doppler  R ICA occlusion, L ICA stent 50-75%  2D Echo  No source of embolus   LDL 76  HgbA1c 5.8  eliquis for VTE prophylaxis Diet - low sodium heart healthy  Eliquis (apixaban) daily prior to admission, now on Eliquis (apixaban) daily  Patient counseled to be compliant with his antithrombotic medications  Ongoing aggressive stroke risk factor management  Therapy recommendations:  No therapy needs  Disposition:  Return home  Right carotid occlusion  Atrial Fibrillation  Home anticoagulation:  eliquis continued in the hospital  Epistaxis, Hgb 10.7->9.9  Continue eliquis at discharge     Hypertension  Stable  Hyperlipidemia  Home meds:  lipitor 40, resumed in hospital  LDL 76, goal < 70  Continue statin at discharge  Other Stroke Risk Factors  Advanced age  Former Cigarette smoker, quit smoking 3 years ago   Hx TIA  Coronary artery disease Status post CABG, stent  Obstructive sleep apnea, on CPAP at home  Other Active Problems  Baseline dementia  Hyperkalemia  Chronic kidney disease  cOPD  Hospital day #   I have personally examined this patient, reviewed notes, independently viewed imaging studies, participated in medical decision making and plan of care. I have made any additions or clarifications directly to the above note. Agree with note above.  He presented with dizziness in the setting of mild epistaxis without any focal neurological deficits. CT scan of the head shows questionable hypodensity in the right basal ganglia possibly an age-indeterminate infarct. He does have old lacunar infarct in left basal ganglia on CT scan as well. MRI cannot be obtained due to pacemaker. Patient's presenting symptoms are nonspecific in the setting of epistaxis and I'm not convinced initially had a stroke or TIA. Stroke team will sign off at the present time. Kindly call for questions.  Antony Contras, MD Medical Director [facility] Pager: [phone] 01/19/2016 3:03 PM    To contact Stroke Continuity provider, please refer to [email or web address] After hours, contact General Neurology

## 2016-01-19 NOTE — Discharge Summary (Signed)
Physician Discharge Summary   Patient ID: Joe Wiggins MRN: [id number] DOB/AGE: 1946-08-23 70 y.o.  Admit date: 01/17/2016 Discharge date: 01/19/2016  Primary Care Physician:  Leota Jacobsen, MD  Discharge Diagnoses:    .  Acute/subacute CVA  . Hyperkalemia . Anemia . COPD (chronic obstructive pulmonary disease) ([place]) . Carotid artery occlusion . Hyperlipidemia . Hypertension . AF (paroxysmal atrial fibrillation) ([place]) . Chronic kidney disease  Consults: Neurology  Recommendations for Outpatient Follow-up:  1. Please repeat CBC/BMET at next visit   DIET: Heart healthy diet    Allergies:   Allergies  Allergen Reactions  . Morphine And Related Nausea Only  . Vicodin [Hydrocodone-Acetaminophen] Other (See Comments)    Hallucinations   . Penicillins Rash     DISCHARGE MEDICATIONS: Current Discharge Medication List    CONTINUE these medications which have NOT CHANGED   Details  apixaban (ELIQUIS) 5 MG TABS tablet Take 5 mg by mouth 2 (two) times daily.    aspirin 81 MG chewable tablet Chew by mouth daily.    atorvastatin (LIPITOR) 40 MG tablet Take 40 mg by mouth daily.    Cholecalciferol (VITAMIN D3) 3000 UNITS TABS Take 1,000 Units by mouth daily.    Cyanocobalamin (VITAMIN B 12 PO) Take by mouth daily.    donepezil (ARICEPT) 10 MG tablet Take 10 mg by mouth at bedtime.    famotidine (PEPCID) 20 MG tablet Take 20 mg by mouth daily.    Fluticasone-Salmeterol (ADVAIR) 250-50 MCG/DOSE AEPB Inhale 1 puff into the lungs 2 (two) times daily.    folic acid (FOLVITE) A999333 MCG tablet Take 400 mcg by mouth daily.    hydrALAZINE (APRESOLINE) 25 MG tablet Take 25 mg by mouth 3 (three) times daily. Per medication list from Dr Serita Grit Tilley's office notes: take 1/2 tablet po three times daily    labetalol (NORMODYNE) 200 MG tablet Take 200 mg by mouth 2 (two) times daily.    levalbuterol (XOPENEX) 1.25 MG/3ML nebulizer solution Take 1.25 mg by nebulization as  needed for wheezing.    loratadine (CLARITIN) 10 MG tablet Take 10 mg by mouth daily.    Memantine HCl (NAMENDA XR PO) Take 7 mg by mouth.    Multiple Vitamin (MULTIVITAMIN) tablet Take 1 tablet by mouth daily.    tiotropium (SPIRIVA HANDIHALER) 18 MCG inhalation capsule Place 18 mcg into inhaler and inhale daily.    torsemide (DEMADEX) 20 MG tablet Take 20 mg by mouth daily.    nitroGLYCERIN (NITROSTAT) 0.4 MG SL tablet Place 0.4 mg under the tongue every 5 (five) minutes as needed for chest pain.         Brief H and P: For complete details please refer to admission H and P, but in briefBilly Wiggins is a very pleasant 70 y.o. male past medical history that includes chronic kidney disease, A. fib, hypertension, COPD, dementia, CAD, lipidemia presents emergency Department chief complaint intermittent dizziness, mild slow nosebleed. Patient has mild-to-moderate dementia. He was in his usual state of health when he developed epistaxis which lasted about 30 minutes. He also had dizziness and unsteady gait which persisted about 15-30 minutes. CT head showed moderate focal hypoattenuation of the subcortical white matter of the posterior right frontal lobe, possible subacute infarct. Neurology was consulted. Patient was admitted for further workup.  Hospital Course:   Dizziness : Improved, subacute CVA, on eliquis - Unable to get MRI/MRA due to pacemaker - CT head showed moderate focal hypoattenuation of the subcortical white matter of the  posterior right frontal lobe, possible subacute infarct. Repeat CT head versus CT angiogram of the head and neck? Awaiting neuro recommendations. - 2-D echo showed EF 50-55% with grade 2 diastolic dysfunction - Carotid Dopplers showed known right sided ICA occlusion, 50-75% left ICA occlusion - Repeat CT head showed subacute infarct, no worsening or any hemorrhagic conversion - Lipid panel showed LDL 76, goal less than 70, placed on statin - Hemoglobin A1c  5.8 - PT evaluation recommending no PT follow-up - Outpatient neurology follow-up scheduled  Hyperkalemia. Mild. -Resolved  Chronic kidney disease. Creatinine on admission 1.65. Wife unsure of what his baseline, patient has CK D, sees nephrology outpatient in [place] -Creatinine function improving, patient was placed on gentle hydration at the time of admission   Hypertension.  - Restart outpatient antihypertensives  A. fib.  -Rate controlled, Chadvasc score 4.  -Continue apixaban -Continue statin  COPD. Stable at baseline. Not on home oxygen. -Continue home meds  Dementia - Currently at baseline, continue Aricept  Day of Discharge BP 163/74 mmHg  Pulse 80  Temp(Src) 98.8 F (37.1 C) (Oral)  Resp 16  Ht 5\' 9"  (1.753 m)  Wt 73.993 kg (163 lb 2 oz)  BMI 24.08 kg/m2  SpO2 98%  Physical Exam: General: Alert and awake oriented x3 not in any acute distress. HEENT: anicteric sclera, pupils reactive to light and accommodation CVS: S1-S2 clear no murmur rubs or gallops Chest: clear to auscultation bilaterally, no wheezing rales or rhonchi Abdomen: soft nontender, nondistended, normal bowel sounds Extremities: no cyanosis, clubbing or edema noted bilaterally Neuro: Cranial nerves II-XII intact, no focal neurological deficits   The results of significant diagnostics from this hospitalization (including imaging, microbiology, ancillary and laboratory) are listed below for reference.    LAB RESULTS: Basic Metabolic Panel:  Recent Labs Lab 01/18/16 0508 01/19/16 0555  NA 139 140  K 3.9 3.8  CL 106 107  CO2 23 23  GLUCOSE 100* 106*  BUN 22* 19  CREATININE 1.52* 1.63*  CALCIUM 9.4 9.6   Liver Function Tests: No results for input(s): AST, ALT, ALKPHOS, BILITOT, PROT, ALBUMIN in the last 168 hours. No results for input(s): LIPASE, AMYLASE in the last 168 hours. No results for input(s): AMMONIA in the last 168 hours. CBC:  Recent Labs Lab 01/17/16 1230  01/18/16 0508  WBC 5.3 5.9  HGB 10.7* 9.9*  HCT 32.8* 29.8*  MCV 99.7 99.3  PLT 261 226   Cardiac Enzymes:  Recent Labs Lab 01/17/16 2050  TROPONINI <0.03   BNP: Invalid input(s): POCBNP CBG: No results for input(s): GLUCAP in the last 168 hours.  Significant Diagnostic Studies:  Dg Chest 2 View  01/17/2016  CLINICAL DATA:  Patient had an episode of nose bleed and dizzy feeling today. Sensation lasted about 30 minutes. History of COPD, CHF, and strokes. EXAM: CHEST  2 VIEW COMPARISON:  02/20/2013 FINDINGS: Postoperative changes in the mediastinum. Cardiac pacemaker. Vascular stent in the left neck. Normal heart size and pulmonary vascularity. New since the previous study, there is focal opacity in the left costophrenic angle with associated pleural fluid or thickening. This may indicate pneumonia. Followup PA and lateral chest X-ray is recommended in 3-4 weeks following trial of antibiotic therapy to ensure resolution and exclude underlying malignancy. Diffuse emphysematous changes in the lungs. Calcification of the aorta. Degenerative changes in the spine and shoulders. IMPRESSION: New opacity in the left lung base with small left pleural effusion or thickening. Findings may indicate pneumonia. Followup PA and lateral chest  X-ray is recommended in 3-4 weeks following trial of antibiotic therapy to ensure resolution and exclude underlying malignancy. Diffuse emphysematous change. Electronically Signed   By: Lucienne Capers M.D.   On: 01/17/2016 18:37   Ct Head Wo Contrast  01/18/2016  CLINICAL DATA:  Revaluation of stroke. EXAM: CT HEAD WITHOUT CONTRAST TECHNIQUE: Contiguous axial images were obtained from the base of the skull through the vertex without intravenous contrast. COMPARISON:  01/17/2016 FINDINGS: There is no evidence of mass effect, midline shift, or extra-axial fluid collections. There is no evidence of a space-occupying lesion or intracranial hemorrhage. There is no evidence  of a cortical-based area of acute infarction. There is generalized cerebral atrophy. There is periventricular white matter low attenuation likely secondary to microangiopathy. There is an old left basal ganglia lacunar infarct. There is subcortical white matter low attenuation in the right frontoparietal lobe likely reflecting more focal microvascular disease versus a subacute or chronic Corona radiata infarct. The ventricles and sulci are appropriate for the patient's age. The basal cisterns are patent. Visualized portions of the orbits are unremarkable. There is a mild left mastoid effusion. There is bilateral ethmoid sinus mucosal thickening. There is bilateral maxillary sinus mucosal thickening. Cerebrovascular atherosclerotic calcifications are noted. The osseous structures are unremarkable. IMPRESSION: 1. Subcortical white matter low attenuation in the right frontoparietal lobe likely reflecting more focal microvascular disease versus a subacute or chronic Corona radiata infarct. No significant interval change compared with the prior exam. 2. Electronically Signed   By: Kathreen Devoid   On: 01/18/2016 16:54   Ct Head Wo Contrast  01/17/2016  CLINICAL DATA:  Acute onset of dizziness. EXAM: CT HEAD WITHOUT CONTRAST TECHNIQUE: Contiguous axial images were obtained from the base of the skull through the vertex without intravenous contrast. COMPARISON:  None. FINDINGS: Brain: No evidence of acute infarction, hemorrhage, extra-axial collection, ventriculomegaly, or mass effect. There is moderate brain parenchymal atrophy and deep white matter chronic small vessel disease changes. There is a more focal hypoattenuation of the subcortical white matter of the posterior right frontal lobe. There is a prior lacunar infarct of the left caudate head. Vascular: No hyperdense vessel or unexpected calcification. Skull: Negative for fracture or focal lesion. Sinuses/Orbits: Polypoid mucosal thickening of bilateral maxillary  and ethmoid sinuses. Other: None. IMPRESSION: Moderate atrophy, and chronic microvascular disease. Remote lacunar infarct of the left basal ganglia. More focal hypoattenuation of the subcortical white matter of the posterior right frontal lobe. This may represent an age-indeterminate infarct, favor subacute. Patient's prior CT exams are not available for review. Electronically Signed   By: Fidela Salisbury M.D.   On: 01/17/2016 15:23    2D ECHO: Study Conclusions  - Left ventricle: The cavity size was normal. There was mild to moderate concentric hypertrophy. Systolic function was normal. The estimated ejection fraction was in the range of 50% to 55%. Wall motion was normal; there were no regional wall motion abnormalities. Features are consistent with a pseudonormal left ventricular filling pattern, with concomitant abnormal relaxation and increased filling pressure (grade 2 diastolic dysfunction). Doppler parameters are consistent with high ventricular filling pressure. - Mitral valve: There was mild regurgitation. - Left atrium: The atrium was mildly dilated. - Right ventricle: The cavity size was normal. Wall thickness was normal. Systolic function was normal. - Tricuspid valve: There was mild regurgitation. - Pulmonary arteries: Systolic pressure was mildly increased. PA peak pressure: 40 mm Hg (S).  Disposition and Follow-up: Discharge Instructions    Diet - low sodium heart  healthy    Complete by:  As directed      Increase activity slowly    Complete by:  As directed             DISPOSITION: Home  DISCHARGE FOLLOW-UP Follow-up Information    Follow up with Dineen Kid D, MD. Schedule an appointment as soon as possible for a visit on 02/04/2016.   Specialty:  Family Medicine   Why:  for hospital follow-up,[place] scheduled appt 02/04/16 at 3pm with DR. Dineen Kid   Contact information:   [address] [place] [address] [phone]        Follow up with SETHI,PRAMOD, MD. Schedule an appointment as soon as possible for a visit on 03/22/2016.   Specialties:  Neurology, Radiology   Why:  for hospital follow-up/stroke follow-up,DIANE scheduled appt 03/22/16 at 11:30 am,Please ask patient to arrive at 11AM.   Contact information:   [address] [place] [address] [phone]        Time spent on Discharge: 30 minutes Signed:   RAI,RIPUDEEP M.D. [facility] 01/19/2016, 11:01 AM Pager: [phone]

## 2016-01-19 NOTE — Evaluation (Signed)
SLP Cancellation Note  Patient Details Name: Joe Wiggins MRN: [id number] DOB: August 02, 1946   Cancelled treatment:       Reason Eval/Treat Not Completed: SLP screened, no needs identified, will sign off ([name] spouse manages bills and pt's medications, appts)   Luanna Salk, [facility] SLP [phone]

## 2016-01-20 DIAGNOSIS — I119 Hypertensive heart disease without heart failure: Secondary | ICD-10-CM | POA: Diagnosis not present

## 2016-01-20 DIAGNOSIS — I255 Ischemic cardiomyopathy: Secondary | ICD-10-CM | POA: Diagnosis not present

## 2016-01-20 DIAGNOSIS — Z8673 Personal history of transient ischemic attack (TIA), and cerebral infarction without residual deficits: Secondary | ICD-10-CM

## 2016-01-20 DIAGNOSIS — Z950 Presence of cardiac pacemaker: Secondary | ICD-10-CM | POA: Diagnosis not present

## 2016-01-20 DIAGNOSIS — I480 Paroxysmal atrial fibrillation: Secondary | ICD-10-CM | POA: Diagnosis not present

## 2016-01-20 DIAGNOSIS — G4730 Sleep apnea, unspecified: Secondary | ICD-10-CM | POA: Diagnosis present

## 2016-01-20 DIAGNOSIS — G4733 Obstructive sleep apnea (adult) (pediatric): Secondary | ICD-10-CM | POA: Diagnosis not present

## 2016-01-20 DIAGNOSIS — I2510 Atherosclerotic heart disease of native coronary artery without angina pectoris: Secondary | ICD-10-CM | POA: Diagnosis not present

## 2016-01-20 DIAGNOSIS — Z7901 Long term (current) use of anticoagulants: Secondary | ICD-10-CM

## 2016-01-21 DIAGNOSIS — R040 Epistaxis: Secondary | ICD-10-CM | POA: Diagnosis not present

## 2016-01-21 DIAGNOSIS — J342 Deviated nasal septum: Secondary | ICD-10-CM | POA: Diagnosis not present

## 2016-02-04 DIAGNOSIS — J300 Vasomotor rhinitis: Secondary | ICD-10-CM | POA: Diagnosis not present

## 2016-02-04 DIAGNOSIS — D62 Acute posthemorrhagic anemia: Secondary | ICD-10-CM | POA: Diagnosis not present

## 2016-02-04 DIAGNOSIS — R400 Somnolence: Secondary | ICD-10-CM | POA: Diagnosis not present

## 2016-02-19 DIAGNOSIS — D62 Acute posthemorrhagic anemia: Secondary | ICD-10-CM | POA: Diagnosis not present

## 2016-03-10 DIAGNOSIS — Z8673 Personal history of transient ischemic attack (TIA), and cerebral infarction without residual deficits: Secondary | ICD-10-CM | POA: Diagnosis not present

## 2016-03-10 DIAGNOSIS — I255 Ischemic cardiomyopathy: Secondary | ICD-10-CM | POA: Diagnosis not present

## 2016-03-10 DIAGNOSIS — F0390 Unspecified dementia without behavioral disturbance: Secondary | ICD-10-CM | POA: Diagnosis not present

## 2016-03-10 DIAGNOSIS — N184 Chronic kidney disease, stage 4 (severe): Secondary | ICD-10-CM | POA: Diagnosis not present

## 2016-03-10 DIAGNOSIS — Z950 Presence of cardiac pacemaker: Secondary | ICD-10-CM | POA: Diagnosis not present

## 2016-03-10 DIAGNOSIS — I2510 Atherosclerotic heart disease of native coronary artery without angina pectoris: Secondary | ICD-10-CM | POA: Diagnosis not present

## 2016-03-10 DIAGNOSIS — I480 Paroxysmal atrial fibrillation: Secondary | ICD-10-CM | POA: Diagnosis not present

## 2016-03-10 DIAGNOSIS — I6523 Occlusion and stenosis of bilateral carotid arteries: Secondary | ICD-10-CM | POA: Diagnosis not present

## 2016-03-10 DIAGNOSIS — G4733 Obstructive sleep apnea (adult) (pediatric): Secondary | ICD-10-CM | POA: Diagnosis not present

## 2016-03-10 DIAGNOSIS — Z7901 Long term (current) use of anticoagulants: Secondary | ICD-10-CM | POA: Diagnosis not present

## 2016-03-10 DIAGNOSIS — I119 Hypertensive heart disease without heart failure: Secondary | ICD-10-CM | POA: Diagnosis not present

## 2016-03-10 DIAGNOSIS — J449 Chronic obstructive pulmonary disease, unspecified: Secondary | ICD-10-CM | POA: Diagnosis not present

## 2016-03-22 ENCOUNTER — Encounter: Payer: Self-pay | Admitting: Neurology

## 2016-03-22 ENCOUNTER — Ambulatory Visit (INDEPENDENT_AMBULATORY_CARE_PROVIDER_SITE_OTHER): Payer: PPO | Admitting: Neurology

## 2016-03-22 VITALS — BP 122/63 | HR 60 | Ht 69.0 in | Wt 164.0 lb

## 2016-03-22 DIAGNOSIS — F0390 Unspecified dementia without behavioral disturbance: Secondary | ICD-10-CM

## 2016-03-22 NOTE — Patient Instructions (Signed)
I had a long discussion with the patient and his wife regarding his dementia which appears stable. [name] is reluctant to increase the dose of Namenda above 7 mg daily as he apparently had trouble tolerating higher doses. She however is willing to consider increasing the Aricept from 10 mg to 23 mg but would first like to his check on what his co-pay increase is going to be before she lets me know. Continue eliquis for stroke prevention for atrial fibrillation and maintain strict control of hypertension with blood pressure goal below 130/90 and lipids with LDL cholesterol goal below 70 mg percent. He will return for follow-up in 3 months with Gilford Raid, nurse practitioner or call earlier if necessary.

## 2016-03-22 NOTE — Progress Notes (Signed)
[facility] [address] [place]. [place] [address] [phone]       OFFICE FOLLOW-UP NOTE  [name] Date of Birth:  02-Feb-1946 Medical Record Number:  [id number]   HPI: 70 year Caucasian male seen today for first office follow-up visit following hospital admission for TIA in January 2017. He is accompanied by his wife who provides most of the history.Joe Wiggins is an 70 y.o. 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 Patient was in his usual state of health until earlier today when started having a nosebleed and feeling " dizzy, swimmy headed, kind of off balance". That sensation was not really vertiginous and lasted for approximately 30 minutes. No associated HA, double vision, difficulty swallowing, slurred speech, language or vision impairment. CT head revealed " more focal hypoattenuation subcortical white matter of the posterior right frontal lobe". He was last known well 01/12/2016, time unknown. Patient was not administered TPA secondary to symptoms resolved. He was admitted for further evaluation and treatment. MRI could not be obtained due to patient having pacemaker. Carotid Doppler showing showed known right ICA chronic occlusion and moderate 50-75% left ICA stenosis with a patent stent. Transthoracic echo showed normal ejection fraction without any clots. LDL cholesterol was 76 mg percent and hemoglobin A1c was 5.8. Patient has done well since discharge. His left upper extremity weakness has improved and is no more dizzy though occasionally does admit to getting lightheaded transiently. His blood pressure is well controlled and today it is 122/63. He remains on eliquis which is tolerating well with only minor bruising  but no bleeding. He states his balance is good and he walks well and had no falls. The patient has had dementia for a while and has been on medications. He is currently on Namenda 7 mg daily and wife informs me that he was unable to tolerate higher dose. He is also on Aricept 10 mg which he seems to be tolerating well. He has not tried a higher dose but wife is reluctant to increase the dose without knowing what the co-pay increase is going to be like. Patient is living at home. He can dress himself and can shower though under observation. Is not driving or cooking. He does occasionally get agitated and Joe Wiggins but he does not have hallucinations or delusions. Wife states he needs constant supervision. He is on low-dose Celexa for agitation but pharmacist has raise concern about possible interaction with Aricept. I advised the patient's wife to discuss this with his primary care physician to change to alternative anxiety medication.  ROS:   14 system review of systems is positive for  fatigue, runny nose, excessive eating, apnea, daytime sleepiness, easy bruising and bleeding, anemia, memory loss, agitation, behavioral problems and all other systems negative PMH:  Past Medical History  Diagnosis Date  . Chronic kidney disease   . AF (paroxysmal atrial fibrillation) ([place])   . Hypertension   . Sleep apnea   . COPD (chronic obstructive pulmonary disease) ([place])   . TIA (transient ischemic attack)   . Dementia without behavioral disturbance   . CAD (coronary artery disease)   . Cardiomyopathy, ischemic   . Carotid artery occlusion   . Hyperlipidemia   . Stroke [facility])     Social History:  Social History  Social History  . Marital Status: Married    Spouse Name: N/A  . Number of Children: N/A  . Years of Education: N/A   Occupational History  . Not on file.   Social History Main Topics  . Smoking status: Former Smoker    Quit date: 11/30/2012  . Smokeless tobacco: Never Used  . Alcohol  Use: No  . Drug Use: No  . Sexual Activity: Not on file   Other Topics Concern  . Not on file   Social History Narrative    Medications:   Current Outpatient Prescriptions on File Prior to Visit  Medication Sig Dispense Refill  . apixaban (ELIQUIS) 5 MG TABS tablet Take 5 mg by mouth 2 (two) times daily.    Marland Kitchen aspirin 81 MG chewable tablet Chew by mouth daily.    Marland Kitchen atorvastatin (LIPITOR) 40 MG tablet Take 40 mg by mouth daily.    . Cholecalciferol (VITAMIN D3) 3000 UNITS TABS Take 1,000 Units by mouth daily.    . Cyanocobalamin (VITAMIN B 12 PO) Take by mouth daily.    Marland Kitchen donepezil (ARICEPT) 10 MG tablet Take 10 mg by mouth at bedtime.    . famotidine (PEPCID) 20 MG tablet Take 20 mg by mouth daily.    . Fluticasone-Salmeterol (ADVAIR) 250-50 MCG/DOSE AEPB Inhale 1 puff into the lungs 2 (two) times daily.    . folic acid (FOLVITE) A999333 MCG tablet Take 400 mcg by mouth daily.    Marland Kitchen labetalol (NORMODYNE) 200 MG tablet Take 200 mg by mouth 2 (two) times daily.    Marland Kitchen levalbuterol (XOPENEX) 1.25 MG/3ML nebulizer solution Take 1.25 mg by nebulization as needed for wheezing.    . Memantine HCl (NAMENDA XR PO) Take 7 mg by mouth.    . Multiple Vitamin (MULTIVITAMIN) tablet Take 1 tablet by mouth daily.    . nitroGLYCERIN (NITROSTAT) 0.4 MG SL tablet Place 0.4 mg under the tongue every 5 (five) minutes as needed for chest pain.    Marland Kitchen tiotropium (SPIRIVA HANDIHALER) 18 MCG inhalation capsule Place 18 mcg into inhaler and inhale daily.    Marland Kitchen torsemide (DEMADEX) 20 MG tablet Take 20 mg by mouth daily.     No current facility-administered medications on file prior to visit.    Allergies:   Allergies  Allergen Reactions  . Morphine And Related Nausea Only  . Vicodin [Hydrocodone-Acetaminophen] Other (See Comments)    Hallucinations   . Penicillins Rash    Physical Exam General: well developed, well nourished Elderly Caucasian male seated, in no evident distress Head: head normocephalic and  atraumatic.  Neck: supple with no carotid or supraclavicular bruits Cardiovascular: regular rate and rhythm, no murmurs Musculoskeletal: no deformity Skin:  no rash/petichiae Vascular:  Normal pulses all extremities Filed Vitals:   03/22/16 1117  BP: 122/63  Pulse: 60   Neurologic Exam Mental Status: Awake and fully alert. Oriented to place and Person only. Recent and remote memory diminished. Attention span, concentration and fund of knowledge poor. Mood and affect appropriate. Mini-Mental status exam 16/30. Animal naming 9 only. Clock drawing 4/4. Cranial Nerves: Fundoscopic exam reveals sharp disc margins. Pupils equal, briskly reactive to light. Extraocular movements full without nystagmus. Visual fields full to confrontation. Hearing intact. Facial sensation intact. Face, tongue, palate moves normally and symmetrically.  Motor: Normal bulk and tone. Normal strength in all tested extremity muscles. Sensory.: intact to touch ,pinprick .position and vibratory sensation.  Coordination: Rapid alternating movements normal in all extremities. Finger-to-nose and heel-to-shin performed  accurately bilaterally. Gait and Station: Arises from chair without difficulty. Stance is normal. Gait demonstrates normal stride length and balance . Able to heel, toe and tandem walk without difficulty.  Reflexes: 1+ and symmetric. Toes downgoing.   NIHSS 2 Modified Rankin 3   ASSESSMENT: 10 year Caucasian male with right brain TIA and subacute right frontal subcortical infarct in July 2017 secondary to atrial fibrillation with mild to moderate baseline dementia. Multiple vascular risk factors of atrial fibrillation, hypertension, hyperlipidemia, coronary artery disease, ischemic cardiomyopathy, bilateral extracranial carotid disease.    PLAN: I had a long discussion with the patient and his wife regarding his dementia which appears stable. [name] is reluctant to increase the dose of Namenda above 7  mg daily as he apparently had trouble tolerating higher doses. She however is willing to consider increasing the Aricept from 10 mg to 23 mg but would first like to his check on what his co-pay increase is going to be before she lets me know. Continue eliquis for stroke prevention for atrial fibrillation and maintain strict control of hypertension with blood pressure goal below 130/90 and lipids with LDL cholesterol goal below 70 mg percent. Check 6 monthly follow-up carotid ultrasound studies. He will return for follow-up in 3 months with Cecille Rubin, nurse practitioner or call earlier if necessary. Greater than 50% of time during this 25 minute visit was spent on counseling,explanation of diagnosis, planning of further management, discussion with patient and family and coordination of care Antony Contras, MD Note: This document was prepared with digital dictation and possible smart phrase technology. Any transcriptional errors that result from this process are unintentional

## 2016-04-21 DIAGNOSIS — I119 Hypertensive heart disease without heart failure: Secondary | ICD-10-CM | POA: Diagnosis not present

## 2016-04-21 DIAGNOSIS — F0390 Unspecified dementia without behavioral disturbance: Secondary | ICD-10-CM | POA: Diagnosis not present

## 2016-04-21 DIAGNOSIS — I480 Paroxysmal atrial fibrillation: Secondary | ICD-10-CM | POA: Diagnosis not present

## 2016-04-21 DIAGNOSIS — G4733 Obstructive sleep apnea (adult) (pediatric): Secondary | ICD-10-CM | POA: Diagnosis not present

## 2016-04-21 DIAGNOSIS — Z8673 Personal history of transient ischemic attack (TIA), and cerebral infarction without residual deficits: Secondary | ICD-10-CM | POA: Diagnosis not present

## 2016-04-21 DIAGNOSIS — Z7901 Long term (current) use of anticoagulants: Secondary | ICD-10-CM | POA: Diagnosis not present

## 2016-04-21 DIAGNOSIS — J449 Chronic obstructive pulmonary disease, unspecified: Secondary | ICD-10-CM | POA: Diagnosis not present

## 2016-04-21 DIAGNOSIS — Z950 Presence of cardiac pacemaker: Secondary | ICD-10-CM | POA: Diagnosis not present

## 2016-04-21 DIAGNOSIS — I6523 Occlusion and stenosis of bilateral carotid arteries: Secondary | ICD-10-CM | POA: Diagnosis not present

## 2016-04-21 DIAGNOSIS — I2510 Atherosclerotic heart disease of native coronary artery without angina pectoris: Secondary | ICD-10-CM | POA: Diagnosis not present

## 2016-04-21 DIAGNOSIS — I255 Ischemic cardiomyopathy: Secondary | ICD-10-CM | POA: Diagnosis not present

## 2016-04-21 DIAGNOSIS — N184 Chronic kidney disease, stage 4 (severe): Secondary | ICD-10-CM | POA: Diagnosis not present

## 2016-05-05 DIAGNOSIS — I5042 Chronic combined systolic (congestive) and diastolic (congestive) heart failure: Secondary | ICD-10-CM | POA: Diagnosis not present

## 2016-05-05 DIAGNOSIS — J449 Chronic obstructive pulmonary disease, unspecified: Secondary | ICD-10-CM | POA: Diagnosis not present

## 2016-05-05 DIAGNOSIS — G4733 Obstructive sleep apnea (adult) (pediatric): Secondary | ICD-10-CM | POA: Diagnosis not present

## 2016-05-05 DIAGNOSIS — Z9989 Dependence on other enabling machines and devices: Secondary | ICD-10-CM | POA: Diagnosis not present

## 2016-05-11 DIAGNOSIS — F331 Major depressive disorder, recurrent, moderate: Secondary | ICD-10-CM | POA: Diagnosis not present

## 2016-05-19 ENCOUNTER — Telehealth: Payer: Self-pay | Admitting: *Deleted

## 2016-05-19 NOTE — Telephone Encounter (Signed)
Rn call patients wife Joe Wiggins back about the aricept medication. Joe Wiggins stated she wants to know increase the aricept from 10mg  to 23mg . Per last note in march, it was discuss at last office visit. Joe Wiggins pts wife did check with the insurance company and it would be like a small co payment. Rn stated a message will be sent to Dr. Davonna Belling about doing a new prescription to the mail pharmacy.

## 2016-05-19 NOTE — Telephone Encounter (Signed)
Message For: OFFICE               Taken 25-MAY-17 at  1:11PM by [facility] ------------------------------------------------------------ [name]  [facility]                CID [id number]  Patient Joe Wiggins           Pt's [facility]        Area Code 336 Phone# [phone] * DOB 07-May-2046    RE HAS QUESTIONS ABOUT MEDICATION                                                                         Disp:Y/N N If Y = C/B If No Response In 32minutes ============================================================

## 2016-05-20 ENCOUNTER — Other Ambulatory Visit: Payer: Self-pay | Admitting: Neurology

## 2016-05-20 MED ORDER — DONEPEZIL HCL 23 MG PO TABS: 23.0000 mg | ORAL_TABLET | Freq: Every day | ORAL | Status: DC

## 2016-05-20 NOTE — Telephone Encounter (Signed)
Rn call patients wife Joe Wiggins back about the aricept 23mg  [facility]. Joe Wiggins verbalized understanding.

## 2016-05-26 ENCOUNTER — Other Ambulatory Visit: Payer: Self-pay

## 2016-05-26 MED ORDER — DONEPEZIL HCL 23 MG PO TABS: 23.0000 mg | ORAL_TABLET | Freq: Every day | ORAL | Status: DC

## 2016-05-26 NOTE — Telephone Encounter (Signed)
[facility] [phone] called regarding donepezil (ARICEPT) 23 MG TABS tablet, states patient was taking 5 mg on 2/8 and also 10 mg on 2/15, current dose is 23 mg, "if this is the correct dose, would like to dispense in 90 day supply for mail order purposes".

## 2016-05-26 NOTE — Telephone Encounter (Signed)
RN  Call Joe Wiggins back at [facility] about doing a 90 script on donepezil. Rn stated Dr. Leonie Man change it on 05/19/2016. Med sent again for 90 days.

## 2016-06-06 DIAGNOSIS — N184 Chronic kidney disease, stage 4 (severe): Secondary | ICD-10-CM | POA: Diagnosis not present

## 2016-06-06 DIAGNOSIS — I119 Hypertensive heart disease without heart failure: Secondary | ICD-10-CM | POA: Diagnosis not present

## 2016-06-06 DIAGNOSIS — I255 Ischemic cardiomyopathy: Secondary | ICD-10-CM | POA: Diagnosis not present

## 2016-06-06 DIAGNOSIS — Z8673 Personal history of transient ischemic attack (TIA), and cerebral infarction without residual deficits: Secondary | ICD-10-CM | POA: Diagnosis not present

## 2016-06-06 DIAGNOSIS — E785 Hyperlipidemia, unspecified: Secondary | ICD-10-CM | POA: Diagnosis not present

## 2016-06-06 DIAGNOSIS — I2510 Atherosclerotic heart disease of native coronary artery without angina pectoris: Secondary | ICD-10-CM | POA: Diagnosis not present

## 2016-06-06 DIAGNOSIS — G4733 Obstructive sleep apnea (adult) (pediatric): Secondary | ICD-10-CM | POA: Diagnosis not present

## 2016-06-06 DIAGNOSIS — I480 Paroxysmal atrial fibrillation: Secondary | ICD-10-CM | POA: Diagnosis not present

## 2016-06-06 DIAGNOSIS — Z7901 Long term (current) use of anticoagulants: Secondary | ICD-10-CM | POA: Diagnosis not present

## 2016-06-06 DIAGNOSIS — J449 Chronic obstructive pulmonary disease, unspecified: Secondary | ICD-10-CM | POA: Diagnosis not present

## 2016-06-06 DIAGNOSIS — Z950 Presence of cardiac pacemaker: Secondary | ICD-10-CM | POA: Diagnosis not present

## 2016-06-06 DIAGNOSIS — I6523 Occlusion and stenosis of bilateral carotid arteries: Secondary | ICD-10-CM | POA: Diagnosis not present

## 2016-06-06 DIAGNOSIS — F0390 Unspecified dementia without behavioral disturbance: Secondary | ICD-10-CM | POA: Diagnosis not present

## 2016-06-16 ENCOUNTER — Ambulatory Visit: Payer: PPO | Admitting: Nurse Practitioner

## 2016-06-29 ENCOUNTER — Encounter: Payer: Self-pay | Admitting: Nurse Practitioner

## 2016-06-29 ENCOUNTER — Ambulatory Visit (INDEPENDENT_AMBULATORY_CARE_PROVIDER_SITE_OTHER): Payer: PPO | Admitting: Nurse Practitioner

## 2016-06-29 VITALS — BP 140/62 | HR 60 | Ht 69.0 in | Wt 169.0 lb

## 2016-06-29 DIAGNOSIS — I6523 Occlusion and stenosis of bilateral carotid arteries: Secondary | ICD-10-CM

## 2016-06-29 DIAGNOSIS — Z8673 Personal history of transient ischemic attack (TIA), and cerebral infarction without residual deficits: Secondary | ICD-10-CM | POA: Diagnosis not present

## 2016-06-29 DIAGNOSIS — I480 Paroxysmal atrial fibrillation: Secondary | ICD-10-CM

## 2016-06-29 DIAGNOSIS — G4730 Sleep apnea, unspecified: Secondary | ICD-10-CM

## 2016-06-29 DIAGNOSIS — E785 Hyperlipidemia, unspecified: Secondary | ICD-10-CM | POA: Diagnosis not present

## 2016-06-29 MED ORDER — MEMANTINE HCL ER 7 MG PO CP24: 7.0000 mg | ORAL_CAPSULE | Freq: Every day | ORAL | Status: DC

## 2016-06-29 NOTE — Patient Instructions (Addendum)
Continue eliquis for stroke prevention and for atrial fibrillation  maintain strict control of hypertension with blood pressure goal below 130/90 todays reading 140/62 continue antihypertensive medications Lipids with LDL cholesterol goal below 100, most recent 103 continue Lipitor Check 6 month follow-up carotid ultrasound studies. Continue Aricept 23 mg daily Continue Namenda 7 mg by mouth daily Follow up in 6 months

## 2016-06-29 NOTE — Progress Notes (Signed)
[facility]  PATIENT: Joe Wiggins DOB: [birth year]   REASON FOR VISIT: Follow-up for stroke/TIA  January 2017, dementia HISTORY FROM: Patient and wife    HISTORY OF PRESENT ILLNESS: Joe Wiggins, 70 year old male returns for follow-up. He had hospital admission for TIA in January 2017. He has past medical history of hypertension hyperlipidemia coronary artery disease status post CABG, pacemaker carotid artery disease, COPD  and dementia. His Aricept was increased to 23 mg at last visit. He is on Namenda 7 mg and has had trouble titrating this in the past. He denies further stroke or TIA symptoms since hospital discharge. He denies any headache double vision difficulty swallowing, slurred speech. He also has a history of obstructive sleep apnea and uses CPAP at night. He remains on eliquis for atrial fib and  secondary stroke prevention. He denies any falls and he continues to walk at the mall.Patient is living at home with his wife.  He can dress himself and can shower though under observation. Is not driving or cooking. He returns for reevaluation   HISTORY 03/22/16 PS69 year Caucasian male seen today for first office follow-up visit following hospital admission for TIA in January 2017. He is accompanied by his wife who provides most of the history.Joe Wiggins is an 70 y.o. 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 Patient was in his usual state of health until earlier today when started having a nosebleed and feeling " dizzy, swimmy headed, kind of off balance". That sensation was not really vertiginous and lasted for approximately 30 minutes. No associated HA, double vision, difficulty swallowing, slurred speech, language or vision  impairment. CT head revealed " more focal hypoattenuation subcortical white matter of the posterior right frontal lobe". He was last known well 01/12/2016, time unknown. Patient was not administered TPA secondary to symptoms resolved. He was admitted for further evaluation and treatment. MRI could not be obtained due to patient having pacemaker. Carotid Doppler showing showed known right ICA chronic occlusion and moderate 50-75% left ICA stenosis with a patent stent. Transthoracic echo showed normal ejection fraction without any clots. LDL cholesterol was 76 mg percent and hemoglobin A1c was 5.8. Patient has done well since discharge. His left upper extremity weakness has improved and is no more dizzy though occasionally does admit to getting lightheaded transiently. His blood pressure is well controlled and today it is 122/63. He remains on eliquis which is tolerating well with only minor bruising but no bleeding. He states his balance is good and he walks well and had no falls. The patient has had dementia for a while and has been on medications. He is currently on Namenda 7 mg daily and wife informs me that he was unable to tolerate higher dose. He is also on Aricept 10 mg which he seems to be tolerating well. He has not tried a higher dose but wife is reluctant to increase the dose without knowing what the co-pay increase is going to be like. Patient is living at home. He can dress himself and can shower though under observation. Is not driving or cooking. He does occasionally get agitated and [name] but he does not have hallucinations or delusions. Wife states he needs constant supervision. He is on low-dose Celexa for agitation but pharmacist has raise concern  about possible interaction with Aricept. I advised the patient's wife to discuss this with his primary care physician to change to alternative anxiety medication.   REVIEW OF SYSTEMS: Full 14 system review of systems performed and notable only for  those listed, all others are neg:  Constitutional: neg  Cardiovascular: neg Ear/Nose/Throat: neg  Skin: neg Eyes: neg Respiratory: neg Gastroitestinal: neg  Hematology/Lymphatic: neg  Endocrine: neg Musculoskeletal:neg Allergy/Immunology: neg Neurological: neg Psychiatric: neg Sleep : neg   ALLERGIES: Allergies  Allergen Reactions  . Morphine And Related Nausea Only  . Vicodin [Hydrocodone-Acetaminophen] Other (See Comments)    Hallucinations   . Penicillins Rash    HOME MEDICATIONS: Outpatient Prescriptions Prior to Visit  Medication Sig Dispense Refill  . amLODipine (NORVASC) 5 MG tablet Take 5 mg by mouth at bedtime.    Marland Kitchen apixaban (ELIQUIS) 5 MG TABS tablet Take 5 mg by mouth 2 (two) times daily.    . Ascorbic Acid (VITAMIN C) 1000 MG tablet Take 1,000 mg by mouth daily.    Marland Kitchen aspirin 81 MG chewable tablet Chew by mouth daily.    Marland Kitchen atorvastatin (LIPITOR) 40 MG tablet Take 40 mg by mouth daily.    . Cholecalciferol (VITAMIN D3) 3000 UNITS TABS Take 1,000 Units by mouth daily.    . Cyanocobalamin (VITAMIN B 12 PO) Take by mouth daily.    Marland Kitchen donepezil (ARICEPT) 23 MG TABS tablet Take 1 tablet (23 mg total) by mouth at bedtime. 90 tablet 3  . famotidine (PEPCID) 20 MG tablet Take 20 mg by mouth daily.    . Fluticasone-Salmeterol (ADVAIR) 250-50 MCG/DOSE AEPB Inhale 1 puff into the lungs 2 (two) times daily.    . folic acid (FOLVITE) A999333 MCG tablet Take 400 mcg by mouth daily.    . IRON PO Take 65 mg by mouth.    . labetalol (NORMODYNE) 200 MG tablet Take 200 mg by mouth 2 (two) times daily.    Marland Kitchen levalbuterol (XOPENEX) 1.25 MG/3ML nebulizer solution Take 1.25 mg by nebulization as needed for wheezing.    . Memantine HCl (NAMENDA XR PO) Take 7 mg by mouth.    . Multiple Vitamin (MULTIVITAMIN) tablet Take 1 tablet by mouth daily.    . nitroGLYCERIN (NITROSTAT) 0.4 MG SL tablet Place 0.4 mg under the tongue every 5 (five) minutes as needed for chest pain.    Marland Kitchen tiotropium  (SPIRIVA HANDIHALER) 18 MCG inhalation capsule Place 18 mcg into inhaler and inhale daily.    Marland Kitchen torsemide (DEMADEX) 20 MG tablet Take 20 mg by mouth daily.     No facility-administered medications prior to visit.    PAST MEDICAL HISTORY: Past Medical History  Diagnosis Date  . Chronic kidney disease   . AF (paroxysmal atrial fibrillation) ([place])   . Hypertension   . Sleep apnea   . COPD (chronic obstructive pulmonary disease) ([place])   . TIA (transient ischemic attack)   . Dementia without behavioral disturbance   . CAD (coronary artery disease)   . Cardiomyopathy, ischemic   . Carotid artery occlusion   . Hyperlipidemia   . Stroke [facility])     PAST SURGICAL HISTORY: Past Surgical History  Procedure Laterality Date  . Cardiac pacemaker placement    . Coronary stent placement    . Coronary artery bypass graft    . Cataract extraction    . Eye surgery      FAMILY HISTORY: No family history on file.  SOCIAL HISTORY: Social History   Social History  .  Marital Status: Married    Spouse Name: N/A  . Number of Children: N/A  . Years of Education: N/A   Occupational History  . Not on file.   Social History Main Topics  . Smoking status: Former Smoker    Quit date: 11/30/2012  . Smokeless tobacco: Never Used  . Alcohol Use: No  . Drug Use: No  . Sexual Activity: Not on file   Other Topics Concern  . Not on file   Social History Narrative     PHYSICAL EXAM  Filed Vitals:   06/29/16 1437  BP: 140/62  Pulse: 60  Height: 5\' 9"  (1.753 m)  Weight: 169 lb (76.658 kg)   Body mass index is 24.95 kg/(m^2). General: well developed, well nourished Elderly Caucasian male seated, well groomed in no evident distress Head: head normocephalic and atraumatic.  Neck: supple with no carotid  bruits Cardiovascular: regular rate and rhythm, no murmurs Musculoskeletal: no deformity Skin: no rash/some petichiae noted on forearms Vascular: Normal pulses all  extremities  Neurological examination  Mental Status: Awake and fully alert. Oriented to Person only. Recent and remote memory diminished. Attention span, concentration and fund of knowledge poor. Mood and affect appropriate. Mini-Mental status exam 19/30. Animal naming 9 only. Clock drawing 4/4.Last MMSE 16/30 Cranial Nerves: Fundoscopic exam reveals sharp disc margins. Pupils equal, briskly reactive to light. Extraocular movements full without nystagmus. Visual fields full to confrontation. Hearing intact. Facial sensation intact. Face, tongue, palate moves normally and symmetrically.  Motor: Normal bulk and tone. Normal strength in all tested extremity muscles. Sensory.: intact to touch ,pinprick .position and vibratory sensation.  Coordination: Rapid alternating movements normal in all extremities. Finger-to-nose and heel-to-shin performed accurately bilaterally. Gait and Station: Arises from chair without difficulty. Stance is normal. Gait demonstrates normal stride length and balance . Able to heel, toe and tandem walk without difficulty.  Reflexes: 1+ and symmetric. Toes downgoing.   DIAGNOSTIC DATA (LABS, IMAGING, TESTING) - I reviewed patient records, labs, notes, testing and imaging myself where available.  Lab Results  Component Value Date   WBC 5.9 01/18/2016   HGB 9.9* 01/18/2016   HCT 29.8* 01/18/2016   MCV 99.3 01/18/2016   PLT 226 01/18/2016      Component Value Date/Time   NA 140 01/19/2016 0555   K 3.8 01/19/2016 0555   CL 107 01/19/2016 0555   CO2 23 01/19/2016 0555   GLUCOSE 106* 01/19/2016 0555   BUN 19 01/19/2016 0555   CREATININE 1.63* 01/19/2016 0555   CALCIUM 9.6 01/19/2016 0555   GFRNONAA 41* 01/19/2016 0555   GFRAA 48* 01/19/2016 0555   Lab Results  Component Value Date   CHOL 129 01/17/2016   HDL 28* 01/17/2016   LDLCALC 76 01/17/2016   TRIG 123 01/17/2016   CHOLHDL 4.6 01/17/2016   Lab Results  Component Value Date   HGBA1C 5.8* 01/17/2016     Lab Results  Component Value Date   TSH 0.574 01/17/2016      ASSESSMENT AND PLAN 32 year Caucasian male with right brain TIA and subacute right frontal subcortical infarct in Jan 2017 secondary to atrial fibrillation with  moderate baseline dementia. Multiple vascular risk factors of atrial fibrillation, hypertension, hyperlipidemia, coronary artery disease, ischemic cardiomyopathy, bilateral extracranial carotid disease and COPD and OSA.  PLAN: Continue eliquis for secondary stroke prevention and for atrial fibrillation  maintain strict control of hypertension with blood pressure goal below 130/90 todays reading 140/62 continue antihypertensive medications Lipids with LDL cholesterol goal  below 100, most recent 103 continue Lipitor Check 6 month follow-up carotid ultrasound studies. Continue Aricept 23 mg daily Continue Namenda 7 mg by mouth daily will refill Follow up in 6 months Joe Bible, [facility], [facility], APRN  [facility] [address] [place], [place] [address] [phone]

## 2016-07-05 ENCOUNTER — Telehealth: Payer: Self-pay

## 2016-07-05 DIAGNOSIS — G4733 Obstructive sleep apnea (adult) (pediatric): Secondary | ICD-10-CM | POA: Diagnosis not present

## 2016-07-05 NOTE — Telephone Encounter (Signed)
RN receive a fax letter from [facility] about pt taking aricept 23 and lexapro prescribed by patients PCP. Rn stated the lexapro is not showing under pts med list. Hildred Alamin technician stated that the pts PCP last prescribed it in May 2017. Rn stated that Dr.Sethi works at the hospital and [place] and will not return until July 17 in the office. Hildred Alamin stated that was fine and it can be fill out and fax to [facility] on MOnday.

## 2016-07-05 NOTE — Progress Notes (Signed)
I agree with the above plan 

## 2016-07-11 DIAGNOSIS — I480 Paroxysmal atrial fibrillation: Secondary | ICD-10-CM | POA: Diagnosis not present

## 2016-07-11 DIAGNOSIS — I255 Ischemic cardiomyopathy: Secondary | ICD-10-CM | POA: Diagnosis not present

## 2016-07-11 DIAGNOSIS — F0390 Unspecified dementia without behavioral disturbance: Secondary | ICD-10-CM | POA: Diagnosis not present

## 2016-07-11 DIAGNOSIS — Z8673 Personal history of transient ischemic attack (TIA), and cerebral infarction without residual deficits: Secondary | ICD-10-CM | POA: Diagnosis not present

## 2016-07-11 DIAGNOSIS — E785 Hyperlipidemia, unspecified: Secondary | ICD-10-CM | POA: Diagnosis not present

## 2016-07-11 DIAGNOSIS — Z7901 Long term (current) use of anticoagulants: Secondary | ICD-10-CM | POA: Diagnosis not present

## 2016-07-11 DIAGNOSIS — I6523 Occlusion and stenosis of bilateral carotid arteries: Secondary | ICD-10-CM | POA: Diagnosis not present

## 2016-07-11 DIAGNOSIS — J449 Chronic obstructive pulmonary disease, unspecified: Secondary | ICD-10-CM | POA: Diagnosis not present

## 2016-07-11 DIAGNOSIS — G4733 Obstructive sleep apnea (adult) (pediatric): Secondary | ICD-10-CM | POA: Diagnosis not present

## 2016-07-11 DIAGNOSIS — N184 Chronic kidney disease, stage 4 (severe): Secondary | ICD-10-CM | POA: Diagnosis not present

## 2016-07-11 DIAGNOSIS — I2510 Atherosclerotic heart disease of native coronary artery without angina pectoris: Secondary | ICD-10-CM | POA: Diagnosis not present

## 2016-07-11 DIAGNOSIS — I119 Hypertensive heart disease without heart failure: Secondary | ICD-10-CM | POA: Diagnosis not present

## 2016-07-12 NOTE — Telephone Encounter (Signed)
Form fax to [facility] at [phone]. Rn fax form about interaction between aricept and lexapro. Per Dr.Sethi advice, he would like them to contact PCP about changing his lexapro to another medication. Dr. Leonie Man only prescribed the aricept and will be continuing it.

## 2016-07-14 ENCOUNTER — Ambulatory Visit (INDEPENDENT_AMBULATORY_CARE_PROVIDER_SITE_OTHER): Payer: PPO

## 2016-07-14 DIAGNOSIS — Z8673 Personal history of transient ischemic attack (TIA), and cerebral infarction without residual deficits: Secondary | ICD-10-CM

## 2016-07-14 DIAGNOSIS — I6523 Occlusion and stenosis of bilateral carotid arteries: Secondary | ICD-10-CM | POA: Diagnosis not present

## 2016-07-21 DIAGNOSIS — G4733 Obstructive sleep apnea (adult) (pediatric): Secondary | ICD-10-CM | POA: Diagnosis not present

## 2016-07-21 DIAGNOSIS — J449 Chronic obstructive pulmonary disease, unspecified: Secondary | ICD-10-CM | POA: Diagnosis not present

## 2016-07-21 DIAGNOSIS — E785 Hyperlipidemia, unspecified: Secondary | ICD-10-CM | POA: Diagnosis not present

## 2016-07-21 DIAGNOSIS — I480 Paroxysmal atrial fibrillation: Secondary | ICD-10-CM | POA: Diagnosis not present

## 2016-07-21 DIAGNOSIS — I2510 Atherosclerotic heart disease of native coronary artery without angina pectoris: Secondary | ICD-10-CM | POA: Diagnosis not present

## 2016-07-21 DIAGNOSIS — I255 Ischemic cardiomyopathy: Secondary | ICD-10-CM | POA: Diagnosis not present

## 2016-07-21 DIAGNOSIS — F0390 Unspecified dementia without behavioral disturbance: Secondary | ICD-10-CM | POA: Diagnosis not present

## 2016-07-21 DIAGNOSIS — Z7901 Long term (current) use of anticoagulants: Secondary | ICD-10-CM | POA: Diagnosis not present

## 2016-07-21 DIAGNOSIS — I6523 Occlusion and stenosis of bilateral carotid arteries: Secondary | ICD-10-CM | POA: Diagnosis not present

## 2016-07-21 DIAGNOSIS — N184 Chronic kidney disease, stage 4 (severe): Secondary | ICD-10-CM | POA: Diagnosis not present

## 2016-07-21 DIAGNOSIS — Z950 Presence of cardiac pacemaker: Secondary | ICD-10-CM | POA: Diagnosis not present

## 2016-07-21 DIAGNOSIS — Z955 Presence of coronary angioplasty implant and graft: Secondary | ICD-10-CM | POA: Diagnosis not present

## 2016-08-15 DIAGNOSIS — D509 Iron deficiency anemia, unspecified: Secondary | ICD-10-CM | POA: Diagnosis not present

## 2016-08-15 DIAGNOSIS — E782 Mixed hyperlipidemia: Secondary | ICD-10-CM | POA: Diagnosis not present

## 2016-08-15 DIAGNOSIS — E538 Deficiency of other specified B group vitamins: Secondary | ICD-10-CM | POA: Diagnosis not present

## 2016-08-15 DIAGNOSIS — I10 Essential (primary) hypertension: Secondary | ICD-10-CM | POA: Diagnosis not present

## 2016-08-15 DIAGNOSIS — E559 Vitamin D deficiency, unspecified: Secondary | ICD-10-CM | POA: Diagnosis not present

## 2016-08-15 DIAGNOSIS — F331 Major depressive disorder, recurrent, moderate: Secondary | ICD-10-CM | POA: Diagnosis not present

## 2016-08-15 DIAGNOSIS — Z0000 Encounter for general adult medical examination without abnormal findings: Secondary | ICD-10-CM | POA: Diagnosis not present

## 2016-08-18 DIAGNOSIS — I10 Essential (primary) hypertension: Secondary | ICD-10-CM | POA: Diagnosis not present

## 2016-08-18 DIAGNOSIS — E538 Deficiency of other specified B group vitamins: Secondary | ICD-10-CM | POA: Diagnosis not present

## 2016-08-18 DIAGNOSIS — E782 Mixed hyperlipidemia: Secondary | ICD-10-CM | POA: Diagnosis not present

## 2016-08-18 DIAGNOSIS — E559 Vitamin D deficiency, unspecified: Secondary | ICD-10-CM | POA: Diagnosis not present

## 2016-08-18 DIAGNOSIS — D509 Iron deficiency anemia, unspecified: Secondary | ICD-10-CM | POA: Diagnosis not present

## 2016-09-23 DIAGNOSIS — D649 Anemia, unspecified: Secondary | ICD-10-CM | POA: Diagnosis not present

## 2016-09-23 DIAGNOSIS — Z23 Encounter for immunization: Secondary | ICD-10-CM | POA: Diagnosis not present

## 2016-09-23 DIAGNOSIS — I10 Essential (primary) hypertension: Secondary | ICD-10-CM | POA: Diagnosis not present

## 2016-09-23 DIAGNOSIS — N184 Chronic kidney disease, stage 4 (severe): Secondary | ICD-10-CM | POA: Diagnosis not present

## 2016-10-28 DIAGNOSIS — J440 Chronic obstructive pulmonary disease with acute lower respiratory infection: Secondary | ICD-10-CM | POA: Diagnosis not present

## 2016-11-01 DIAGNOSIS — I480 Paroxysmal atrial fibrillation: Secondary | ICD-10-CM | POA: Diagnosis not present

## 2016-11-01 DIAGNOSIS — J449 Chronic obstructive pulmonary disease, unspecified: Secondary | ICD-10-CM | POA: Diagnosis not present

## 2016-11-01 DIAGNOSIS — N184 Chronic kidney disease, stage 4 (severe): Secondary | ICD-10-CM | POA: Diagnosis not present

## 2016-11-01 DIAGNOSIS — I255 Ischemic cardiomyopathy: Secondary | ICD-10-CM | POA: Diagnosis not present

## 2016-11-01 DIAGNOSIS — F0390 Unspecified dementia without behavioral disturbance: Secondary | ICD-10-CM | POA: Diagnosis not present

## 2016-11-01 DIAGNOSIS — Z955 Presence of coronary angioplasty implant and graft: Secondary | ICD-10-CM | POA: Diagnosis not present

## 2016-11-01 DIAGNOSIS — Z7901 Long term (current) use of anticoagulants: Secondary | ICD-10-CM | POA: Diagnosis not present

## 2016-11-01 DIAGNOSIS — Z950 Presence of cardiac pacemaker: Secondary | ICD-10-CM | POA: Diagnosis not present

## 2016-11-01 DIAGNOSIS — E785 Hyperlipidemia, unspecified: Secondary | ICD-10-CM | POA: Diagnosis not present

## 2016-11-01 DIAGNOSIS — I6523 Occlusion and stenosis of bilateral carotid arteries: Secondary | ICD-10-CM | POA: Diagnosis not present

## 2016-11-01 DIAGNOSIS — I2510 Atherosclerotic heart disease of native coronary artery without angina pectoris: Secondary | ICD-10-CM | POA: Diagnosis not present

## 2016-11-01 DIAGNOSIS — G4733 Obstructive sleep apnea (adult) (pediatric): Secondary | ICD-10-CM | POA: Diagnosis not present

## 2016-11-30 ENCOUNTER — Encounter: Payer: Self-pay | Admitting: Family

## 2016-12-05 ENCOUNTER — Encounter (HOSPITAL_COMMUNITY): Payer: Medicare HMO

## 2016-12-06 ENCOUNTER — Ambulatory Visit (HOSPITAL_COMMUNITY)
Admission: RE | Admit: 2016-12-06 | Discharge: 2016-12-06 | Disposition: A | Discharge status: Home / Self Care | Payer: PPO | Source: Ambulatory Visit | Attending: Vascular Surgery | Admitting: Vascular Surgery

## 2016-12-06 ENCOUNTER — Encounter: Payer: Self-pay | Admitting: Family

## 2016-12-06 ENCOUNTER — Ambulatory Visit (INDEPENDENT_AMBULATORY_CARE_PROVIDER_SITE_OTHER): Payer: PPO | Admitting: Family

## 2016-12-06 ENCOUNTER — Encounter (HOSPITAL_COMMUNITY): Payer: Medicare HMO

## 2016-12-06 ENCOUNTER — Ambulatory Visit: Payer: Medicare HMO | Admitting: Family

## 2016-12-06 VITALS — BP 147/64 | HR 63 | Temp 97.0°F | Resp 20 | Wt 171.0 lb

## 2016-12-06 DIAGNOSIS — I6522 Occlusion and stenosis of left carotid artery: Secondary | ICD-10-CM

## 2016-12-06 DIAGNOSIS — Z959 Presence of cardiac and vascular implant and graft, unspecified: Secondary | ICD-10-CM

## 2016-12-06 DIAGNOSIS — I6521 Occlusion and stenosis of right carotid artery: Secondary | ICD-10-CM

## 2016-12-06 LAB — VAS US CAROTID
LEFT ECA DIAS: -77 cm/s
Left CCA dist dias: -22 cm/s
Left CCA dist sys: -62 cm/s
Left CCA prox dias: 15 cm/s
Left CCA prox sys: 73 cm/s
Left ICA dist dias: -40 cm/s
Left ICA dist sys: -106 cm/s
Left ICA prox dias: -42 cm/s
Left ICA prox sys: -107 cm/s

## 2016-12-06 NOTE — Patient Instructions (Signed)
Stroke Prevention Some medical conditions and behaviors are associated with an increased chance of having a stroke. You may prevent a stroke by making healthy choices and managing medical conditions. How can I reduce my risk of having a stroke?  Stay physically active. Get at least 30 minutes of activity on most or all days.  Do not smoke. It may also be helpful to avoid exposure to secondhand smoke.  Limit alcohol use. Moderate alcohol use is considered to be:  No more than 2 drinks per day for men.  No more than 1 drink per day for nonpregnant women.  Eat healthy foods. This involves:  Eating 5 or more servings of fruits and vegetables a day.  Making dietary changes that address high blood pressure (hypertension), high cholesterol, diabetes, or obesity.  Manage your cholesterol levels.  Making food choices that are high in fiber and low in saturated fat, trans fat, and cholesterol may control cholesterol levels.  Take any prescribed medicines to control cholesterol as directed by your health care provider.  Manage your diabetes.  Controlling your carbohydrate and sugar intake is recommended to manage diabetes.  Take any prescribed medicines to control diabetes as directed by your health care provider.  Control your hypertension.  Making food choices that are low in salt (sodium), saturated fat, trans fat, and cholesterol is recommended to manage hypertension.  Ask your health care provider if you need treatment to lower your blood pressure. Take any prescribed medicines to control hypertension as directed by your health care provider.  If you are 18-39 years of age, have your blood pressure checked every 3-5 years. If you are 40 years of age or older, have your blood pressure checked every year.  Maintain a healthy weight.  Reducing calorie intake and making food choices that are low in sodium, saturated fat, trans fat, and cholesterol are recommended to manage  weight.  Stop drug abuse.  Avoid taking birth control pills.  Talk to your health care provider about the risks of taking birth control pills if you are over 35 years old, smoke, get migraines, or have ever had a blood clot.  Get evaluated for sleep disorders (sleep apnea).  Talk to your health care provider about getting a sleep evaluation if you snore a lot or have excessive sleepiness.  Take medicines only as directed by your health care provider.  For some people, aspirin or blood thinners (anticoagulants) are helpful in reducing the risk of forming abnormal blood clots that can lead to stroke. If you have the irregular heart rhythm of atrial fibrillation, you should be on a blood thinner unless there is a good reason you cannot take them.  Understand all your medicine instructions.  Make sure that other conditions (such as anemia or atherosclerosis) are addressed. Get help right away if:  You have sudden weakness or numbness of the face, arm, or leg, especially on one side of the body.  Your face or eyelid droops to one side.  You have sudden confusion.  You have trouble speaking (aphasia) or understanding.  You have sudden trouble seeing in one or both eyes.  You have sudden trouble walking.  You have dizziness.  You have a loss of balance or coordination.  You have a sudden, severe headache with no known cause.  You have new chest pain or an irregular heartbeat. Any of these symptoms may represent a serious problem that is an emergency. Do not wait to see if the symptoms will go away.   Get medical help at once. Call your local emergency services (911 in [place] Do not drive yourself to the hospital. This information is not intended to replace advice given to you by your health care provider. Make sure you discuss any questions you have with your health care provider. Document Released: 01/19/2005 Document Revised: 05/19/2016 Document Reviewed: 06/14/2013 Elsevier  Interactive Patient Education  2017 Elsevier Inc.  

## 2016-12-06 NOTE — Progress Notes (Signed)
Chief Complaint: Follow up Extracranial Carotid Artery Stenosis   History of Present Illness  Joe Wiggins is a 70 y.o. male patient who was initially referred by Dr. Wynonia Lawman to establish care. Dr. Luther Parody initial evaluation was on 12-01-15. The patient is s/p left carotid stenting by Dr. Maryjean Morn in 2013. He suffered a stroke during the procedure and on postoperative day 1. He has a known right internal carotid occlusion. It is unclear whether this was associated with a stroke or not. He has mild neurological deficits including some memory issues, tingling in left fingers and foot, and a slight left foot drop. He denies any amaurosis fugax, receptive or expressive aphasia, facial droop or sudden numbness/weakness of his arms or legs.  It seems that he has had no subsequent stroke or TIA to 2014.   He has a past medical history of paroxysmal atrial fibrillation managed on eliquis. He has CAD s/p CABG. He has hyperlipidemia managed on a statin and hypertension managed on two antihypertensives. He takes a daily aspirin. He is not diabetic. He is a former smoker.  On ROS, he denies any issues with ambulation. He walks around 2 hours a day and only has to rest secondary to feeling tired.    Pt Diabetic: no Pt smoker: former smoker, quit in 2013, started at age 9  Pt meds include: Statin : yes ASA: yes Other anticoagulants/antiplatelets: Eliquis   Past Medical History:  Diagnosis Date  . AF (paroxysmal atrial fibrillation) ([place])   . CAD (coronary artery disease)   . Cardiomyopathy, ischemic   . Carotid artery occlusion   . Chronic kidney disease   . COPD (chronic obstructive pulmonary disease) ([place])   . Dementia without behavioral disturbance   . Hyperlipidemia   . Hypertension   . Sleep apnea   . Stroke ([place])   . TIA (transient ischemic attack)     Social History Social History  Substance Use Topics  . Smoking status: Former Smoker    Quit date: 11/30/2012  . Smokeless tobacco:  Never Used  . Alcohol use No    Family History No family history on file.  Surgical History Past Surgical History:  Procedure Laterality Date  . CARDIAC PACEMAKER PLACEMENT    . CATARACT EXTRACTION    . CORONARY ARTERY BYPASS GRAFT    . CORONARY STENT PLACEMENT    . EYE SURGERY      Allergies  Allergen Reactions  . Morphine And Related Nausea Only  . Vicodin [Hydrocodone-Acetaminophen] Other (See Comments)    Hallucinations   . Penicillins Rash    Current Outpatient Prescriptions  Medication Sig Dispense Refill  . amLODipine (NORVASC) 5 MG tablet Take 5 mg by mouth at bedtime.    Marland Kitchen apixaban (ELIQUIS) 5 MG TABS tablet Take 5 mg by mouth 2 (two) times daily.    . Ascorbic Acid (VITAMIN C) 1000 MG tablet Take 1,000 mg by mouth daily.    Marland Kitchen aspirin 81 MG chewable tablet Chew by mouth daily.    Marland Kitchen atorvastatin (LIPITOR) 40 MG tablet Take 40 mg by mouth daily.    . Cholecalciferol (VITAMIN D3) 3000 UNITS TABS Take 1,000 Units by mouth daily.    . Cyanocobalamin (VITAMIN B 12 PO) Take by mouth daily.    Marland Kitchen donepezil (ARICEPT) 23 MG TABS tablet Take 1 tablet (23 mg total) by mouth at bedtime. 90 tablet 3  . famotidine (PEPCID) 20 MG tablet Take 20 mg by mouth daily.    . Fluticasone-Salmeterol (ADVAIR)  250-50 MCG/DOSE AEPB Inhale 1 puff into the lungs 2 (two) times daily.    . folic acid (FOLVITE) 202 MCG tablet Take 400 mcg by mouth daily.    . IRON PO Take 65 mg by mouth.    . labetalol (NORMODYNE) 200 MG tablet Take 200 mg by mouth 2 (two) times daily.    Marland Kitchen levalbuterol (XOPENEX) 1.25 MG/3ML nebulizer solution Take 1.25 mg by nebulization as needed for wheezing.    . memantine (NAMENDA XR) 7 MG CP24 24 hr capsule Take 1 capsule (7 mg total) by mouth daily. 90 capsule 1  . Multiple Vitamin (MULTIVITAMIN) tablet Take 1 tablet by mouth daily.    . nitroGLYCERIN (NITROSTAT) 0.4 MG SL tablet Place 0.4 mg under the tongue every 5 (five) minutes as needed for chest pain.    Marland Kitchen tiotropium  (SPIRIVA HANDIHALER) 18 MCG inhalation capsule Place 18 mcg into inhaler and inhale daily.    Marland Kitchen torsemide (DEMADEX) 20 MG tablet Take 20 mg by mouth daily.     No current facility-administered medications for this visit.     Review of Systems : See HPI for pertinent positives and negatives.  Physical Examination  Vitals:   12/06/16 1205  BP: (!) 147/64  Pulse: 63  Resp: 20  Temp: 97 F (36.1 C)  SpO2: 95%  Weight: 171 lb (77.6 kg)   Body mass index is 25.25 kg/m.  General: WDWN male in NAD GAIT: normal Eyes: PERRLA Pulmonary:  Respirations are non-labored, good air movement, CTAB Cardiac: regular rhythm, no detected murmur.  VASCULAR EXAM Carotid Bruits Right Left   Positive Positive    Aorta is not palpable. Radial pulses are 2+ palpable and equal.                                                                                                                            LE Pulses Right Left       POPLITEAL  not palpable   not palpable       POSTERIOR TIBIAL   palpable    palpable        DORSALIS PEDIS      ANTERIOR TIBIAL faintly palpable  faintly palpable     Gastrointestinal: soft, nontender, BS WNL, no r/g, no palpable masses.  Musculoskeletal: No muscle atrophy/wasting. M/S 5/5 throughout, extremities without ischemic changes.  Neurologic: A&O X 3; Appropriate Affect, Speech is normal CN 2-12 intact except tongue protrusion is slightly to the left, pain and light touch intact in extremities, Motor exam as listed above.     Assessment: Joe Wiggins is a 70 y.o. male who is s/p left carotid stenting in 2013. He suffered a stroke during the procedure and on postoperative day 1. He has a known right internal carotid occlusion.  He has not had subsequent strokes or TIA's since 2013.  His atherosclerotic risk factors include former smoker x 54 years, CAD, atrial fib, CKD, COPD, hypertension, and dyslipidemia.  He  takes Eliquis, ASA, and a statin. He walks 2  hours daily.   DATA Today's carotid duplex demonstrates no significant stenosis of the left CCA. >50% stenosis of the left ECA which is most likely due to the takeoff from the carotid stent.  The bilateral vertebral arteries are antegrade. History of known right ICA occlusion.  Patent left carotid stent with no left internal carotid artery stenosis.  No change in the left carotid arteries since the previous exam on 11-27-15.   Plan: Follow-up in 1 year with Carotid Duplex scan.   I discussed in depth with the patient the nature of atherosclerosis, and emphasized the importance of maximal medical management including strict control of blood pressure, blood glucose, and lipid levels, obtaining regular exercise, and continued cessation of smoking.  The patient is aware that without maximal medical management the underlying atherosclerotic disease process will progress, limiting the benefit of any interventions. The patient was given information about stroke prevention and what symptoms should prompt the patient to seek immediate medical care. Thank you for allowing [place] to participate in this patient's care.  Clemon Chambers, RN, MSN, FNP-C [facility] Office: [phone]  Clinic Physician: Early  12/06/16 12:10 PM

## 2016-12-13 NOTE — Addendum Note (Signed)
Addended by: Lianne Cure A on: 12/13/2016 09:26 AM   Modules accepted: Orders

## 2016-12-30 ENCOUNTER — Ambulatory Visit: Payer: PPO | Admitting: Nurse Practitioner

## 2017-01-03 DIAGNOSIS — I6523 Occlusion and stenosis of bilateral carotid arteries: Secondary | ICD-10-CM | POA: Diagnosis not present

## 2017-01-03 DIAGNOSIS — I480 Paroxysmal atrial fibrillation: Secondary | ICD-10-CM | POA: Diagnosis not present

## 2017-01-03 DIAGNOSIS — Z7901 Long term (current) use of anticoagulants: Secondary | ICD-10-CM | POA: Diagnosis not present

## 2017-01-03 DIAGNOSIS — Z955 Presence of coronary angioplasty implant and graft: Secondary | ICD-10-CM | POA: Diagnosis not present

## 2017-01-03 DIAGNOSIS — I255 Ischemic cardiomyopathy: Secondary | ICD-10-CM | POA: Diagnosis not present

## 2017-01-03 DIAGNOSIS — N184 Chronic kidney disease, stage 4 (severe): Secondary | ICD-10-CM | POA: Diagnosis not present

## 2017-01-03 DIAGNOSIS — J449 Chronic obstructive pulmonary disease, unspecified: Secondary | ICD-10-CM | POA: Diagnosis not present

## 2017-01-03 DIAGNOSIS — E785 Hyperlipidemia, unspecified: Secondary | ICD-10-CM | POA: Diagnosis not present

## 2017-01-03 DIAGNOSIS — I2510 Atherosclerotic heart disease of native coronary artery without angina pectoris: Secondary | ICD-10-CM | POA: Diagnosis not present

## 2017-01-03 DIAGNOSIS — G4733 Obstructive sleep apnea (adult) (pediatric): Secondary | ICD-10-CM | POA: Diagnosis not present

## 2017-01-03 DIAGNOSIS — Z950 Presence of cardiac pacemaker: Secondary | ICD-10-CM | POA: Diagnosis not present

## 2017-01-03 DIAGNOSIS — F0390 Unspecified dementia without behavioral disturbance: Secondary | ICD-10-CM | POA: Diagnosis not present

## 2017-02-01 ENCOUNTER — Ambulatory Visit (INDEPENDENT_AMBULATORY_CARE_PROVIDER_SITE_OTHER): Payer: PPO | Admitting: Nurse Practitioner

## 2017-02-01 ENCOUNTER — Encounter: Payer: Self-pay | Admitting: Nurse Practitioner

## 2017-02-01 VITALS — BP 150/80 | HR 71 | Ht 69.0 in | Wt 176.4 lb

## 2017-02-01 DIAGNOSIS — Z8673 Personal history of transient ischemic attack (TIA), and cerebral infarction without residual deficits: Secondary | ICD-10-CM

## 2017-02-01 DIAGNOSIS — G309 Alzheimer's disease, unspecified: Secondary | ICD-10-CM

## 2017-02-01 DIAGNOSIS — G459 Transient cerebral ischemic attack, unspecified: Secondary | ICD-10-CM | POA: Diagnosis not present

## 2017-02-01 DIAGNOSIS — I6523 Occlusion and stenosis of bilateral carotid arteries: Secondary | ICD-10-CM | POA: Diagnosis not present

## 2017-02-01 DIAGNOSIS — E7800 Pure hypercholesterolemia, unspecified: Secondary | ICD-10-CM

## 2017-02-01 DIAGNOSIS — F0280 Dementia in other diseases classified elsewhere without behavioral disturbance: Secondary | ICD-10-CM | POA: Diagnosis not present

## 2017-02-01 DIAGNOSIS — I480 Paroxysmal atrial fibrillation: Secondary | ICD-10-CM

## 2017-02-01 NOTE — Patient Instructions (Signed)
Continue eliquis for secondary stroke prevention and for atrial fibrillation  maintain strict control of hypertension with blood pressure goal below 130/90 todays reading 150/80 continue antihypertensive medications Lipids with LDL cholesterol goal below 100, most recent 103 continue Lipitor Carotid ultrasound studiesare followed by Dr. Wynonia Lawman. Continue Aricept 23 mg daily Continue Namenda 7 mg by mouth daily will refill patient has been unable to tolerate higher dose Exercise by walking, word search crossword puzzles keeping up with current events all will help but will actually did not access relocated to copy simple. He completed because I feel like he was admitted to order to memory Follow up in 6 months, next with Dr. Leonie Man

## 2017-02-01 NOTE — Progress Notes (Signed)
[facility]  PATIENT: Joe Wiggins DOB: 03-14-1946   REASON FOR VISIT: Follow-up for stroke/TIA  January 2017, dementia  HISTORY FROM: Patient and wife    HISTORY OF PRESENT ILLNESS: UPDATE 02/01/17 [name], 71 year old male returns for follow-up. He had hospital admission for TIA in January 2017. He has past medical history of paroxysmal atrial fibrillation on eliquis hypertension hyperlipidemia coronary artery disease status post CABG, pacemaker,  carotid artery disease, COPD  and dementia. He is currently on Aricept  23 mg daily  He is on Namenda 7 mg and has had trouble titrating this in the past. He denies further stroke or TIA symptoms since hospital discharge. He denies any headache double vision difficulty swallowing, slurred speech. He also has a history of obstructive sleep apnea and uses CPAP at night. He remains on eliquis for atrial fib and  secondary stroke prevention. He has bruising on his hands but no signs of bleeding. He denies any falls and he continues to walk at the mall.Patient is living at home with his wife.  He can dress himself and can shower though under observation. Is not driving or cooking. Appetite is good and he is sleeping well without hallucinations or wandering behavior. No safety issues identified. Last carotid Doppler done in this office 07/14/2016 shows complete occlusion of the right ICA and suspected left VA and abnormally elevated BNP was seen in the right and left ECA consistent with mild stenosis of the vessels. The left ICA stent is elevated BNP was still less than 50% diameter reduction mixed mild severe plaques with acoustic shadowing were noted bilaterally. He continues to follow up with Dr. Donnetta Hutching CV[facility].  Wife  reports he sometimes agitated and she was asked to discuss this with his primary care physician for management .He returns for reevaluation    REVIEW OF SYSTEMS: Full 14 system review of systems performed and notable only for  those listed, all others are neg:  Constitutional: Fatigue Cardiovascular: neg Ear/Nose/Throat: neg  Skin: neg Eyes: neg Respiratory: neg Gastroitestinal: neg  Hematology/Lymphatic: Easy bruising  Endocrine: neg Musculoskeletal:neg Allergy/Immunology: neg Neurological: Memory loss Psychiatric: Anxiety Sleep : Obstructive sleep apnea with CPAP   ALLERGIES: Allergies  Allergen Reactions  . Morphine And Related Nausea Only  . Vicodin [Hydrocodone-Acetaminophen] Other (See Comments)    Hallucinations   . Penicillins Rash    HOME MEDICATIONS: Outpatient Medications Prior to Visit  Medication Sig Dispense Refill  . amLODipine (NORVASC) 5 MG tablet Take 5 mg by mouth at bedtime.    Marland Kitchen apixaban (ELIQUIS) 5 MG TABS tablet Take 5 mg by mouth 2 (two) times daily.    . Ascorbic Acid (VITAMIN C) 1000 MG tablet Take 1,000 mg by mouth daily.    Marland Kitchen aspirin 81 MG chewable tablet Chew by mouth daily.    Marland Kitchen atorvastatin (LIPITOR) 40 MG tablet Take 40 mg by mouth daily.    . Cholecalciferol (VITAMIN D3) 3000 UNITS TABS Take 1,000 Units by mouth daily.    . Cyanocobalamin (VITAMIN B 12 PO) Take by mouth daily.    Marland Kitchen donepezil (ARICEPT) 23 MG TABS tablet Take 1 tablet (23 mg total) by mouth at bedtime. 90 tablet 3  . famotidine (PEPCID) 20 MG tablet Take 20 mg by mouth daily.    . Fluticasone-Salmeterol (ADVAIR) 250-50 MCG/DOSE AEPB Inhale 1 puff into the lungs 2 (two) times daily.    . folic acid (FOLVITE) 449 MCG tablet Take 400 mcg by mouth daily.    Marland Kitchen  IRON PO Take 65 mg by mouth.    . labetalol (NORMODYNE) 200 MG tablet Take 200 mg by mouth 2 (two) times daily.    Marland Kitchen levalbuterol (XOPENEX) 1.25 MG/3ML nebulizer solution Take 1.25 mg by nebulization as needed for wheezing.    . memantine (NAMENDA XR) 7 MG CP24 24 hr capsule Take 1 capsule (7 mg total) by mouth daily. 90 capsule 1  . Multiple Vitamin (MULTIVITAMIN) tablet Take 1 tablet by mouth daily.    . nitroGLYCERIN (NITROSTAT) 0.4 MG SL  tablet Place 0.4 mg under the tongue every 5 (five) minutes as needed for chest pain.    Marland Kitchen tiotropium (SPIRIVA HANDIHALER) 18 MCG inhalation capsule Place 18 mcg into inhaler and inhale daily.    Marland Kitchen torsemide (DEMADEX) 20 MG tablet Take 20 mg by mouth daily.     No facility-administered medications prior to visit.     PAST MEDICAL HISTORY: Past Medical History:  Diagnosis Date  . AF (paroxysmal atrial fibrillation) ([place])   . CAD (coronary artery disease)   . Cardiomyopathy, ischemic   . Carotid artery occlusion   . Chronic kidney disease   . COPD (chronic obstructive pulmonary disease) ([place])   . Dementia without behavioral disturbance   . Hyperlipidemia   . Hypertension   . Sleep apnea   . Stroke ([place])   . TIA (transient ischemic attack)     PAST SURGICAL HISTORY: Past Surgical History:  Procedure Laterality Date  . CARDIAC PACEMAKER PLACEMENT    . CATARACT EXTRACTION    . CORONARY ARTERY BYPASS GRAFT    . CORONARY STENT PLACEMENT    . EYE SURGERY      FAMILY HISTORY: History reviewed. No pertinent family history.  SOCIAL HISTORY: Social History   Social History  . Marital status: Married    Spouse name: N/A  . Number of children: N/A  . Years of education: N/A   Occupational History  . Not on file.   Social History Main Topics  . Smoking status: Former Smoker    Quit date: 11/30/2012  . Smokeless tobacco: Never Used  . Alcohol use No  . Drug use: No  . Sexual activity: Not on file   Other Topics Concern  . Not on file   Social History Narrative  . No narrative on file     PHYSICAL EXAM  Vitals:   02/01/17 1513  BP: (!) 150/80  Pulse: 71  Weight: 176 lb 6.4 oz (80 kg)  Height: 5\' 9"  (1.753 m)   Body mass index is 26.05 kg/m.   Generalized: Well developed, in no acute distress , well-groomed Head: normocephalic and atraumatic,. Oropharynx benign  Neck: Supple, no carotid bruits  Cardiac: Regular rate rhythm, no murmur  Musculoskeletal: No  deformity  Skin bruising of the hands Neurological examination   Mentation: Alert MMSE 19/30. AFT 9. Clock drawing 2/4.  MMSE - Mini Mental State Exam 02/01/2017 06/29/2016  Orientation to time 4 1  Orientation to Place 3 3  Registration 3 3  Attention/ Calculation 1 3  Recall 0 0  Language- name 2 objects 2 2  Language- repeat 1 1  Language- follow 3 step command 3 3  Language- read & follow direction 1 1  Write a sentence 1 1  Copy design 0 1  Total score 19 19  .  Follows all commands speech and language fluent.   Cranial nerve II-XII: .Pupils were equal round reactive to light extraocular movements were full, visual field were  full on confrontational test. Facial sensation and strength were normal. hearing was intact to finger rubbing bilaterally. Uvula tongue midline. head turning and shoulder shrug were normal and symmetric.Tongue protrusion into cheek strength was normal. Motor: normal bulk and tone, full strength in the BUE, BLE, fine finger movements normal, no pronator drift. No focal weakness Sensory: normal and symmetric to light touch, pinprick, and  Vibration, In the upper and lower extremities Coordination: finger-nose-finger, heel-to-shin bilaterally, no dysmetria Reflexes: Symmetric upper and lower plantar responses were flexor bilaterally. Gait and Station: Rising up from seated position without assistance, normal stance,  moderate stride, good arm swing, smooth turning, able to perform tiptoe, and heel walking without difficulty. Tandem gait is mildly unsteady. No assistive device  DIAGNOSTIC DATA (LABS, IMAGING, TESTING) - I reviewed patient records, labs, notes, testing and imaging myself where available.   Reviewed recent CMP and lipid profile from 08/18/2016 from care everywhere    ASSESSMENT AND PLAN 30 year Caucasian male with right brain TIA and subacute right frontal subcortical infarct in Jan 2017 secondary to atrial fibrillation with  moderate baseline  dementia. Multiple vascular risk factors of atrial fibrillation, hypertension, hyperlipidemia, coronary artery disease, ischemic cardiomyopathy, bilateral extracranial carotid disease and COPD and OSA..The patient is a current patient of Dr. Leonie Man  who is out of the office today . This note is sent to the work in doctor.     PLAN: Continue eliquis for secondary stroke prevention and for atrial fibrillation  Maintain strict control of hypertension with blood pressure goal below 130/90 todays reading 150/80 continue antihypertensive medications Lipids with LDL cholesterol goal below 100, most recent 78 continue Lipitor Carotid ultrasound studiesare followed by Dr. Wynonia Lawman. Continue Aricept 23 mg daily Continue Namenda 7 mg by mouth daily will refill patient has been unable to tolerate higher dose Exercise by walking, word search crossword puzzles keeping up with current events all will help but will actually did not access relocated to copy simple. He completed because I feel like he was admitted to order to memory Follow up in 6 months, next with Dr. Leonie Man I spent 28min  in total face to face time with the patient more than 50% of which was spent counseling and coordination of care, reviewing test results reviewing medications and discussing and reviewing the diagnosis of stroke prevention, atherosclerosis and importance of control of blood pressure blood glucose and lipid levels and continued cessation of smoking  Dennie Bible, [facility], [facility], APRN  [facility] [address], [place], [place] [address] [phone]

## 2017-02-08 DIAGNOSIS — I480 Paroxysmal atrial fibrillation: Secondary | ICD-10-CM | POA: Diagnosis not present

## 2017-02-16 DIAGNOSIS — I509 Heart failure, unspecified: Secondary | ICD-10-CM | POA: Diagnosis not present

## 2017-02-16 DIAGNOSIS — R600 Localized edema: Secondary | ICD-10-CM | POA: Diagnosis not present

## 2017-02-16 DIAGNOSIS — J300 Vasomotor rhinitis: Secondary | ICD-10-CM | POA: Diagnosis not present

## 2017-02-16 DIAGNOSIS — I480 Paroxysmal atrial fibrillation: Secondary | ICD-10-CM | POA: Diagnosis not present

## 2017-02-16 DIAGNOSIS — I130 Hypertensive heart and chronic kidney disease with heart failure and stage 1 through stage 4 chronic kidney disease, or unspecified chronic kidney disease: Secondary | ICD-10-CM | POA: Diagnosis not present

## 2017-02-16 DIAGNOSIS — E782 Mixed hyperlipidemia: Secondary | ICD-10-CM | POA: Diagnosis not present

## 2017-02-16 DIAGNOSIS — F331 Major depressive disorder, recurrent, moderate: Secondary | ICD-10-CM | POA: Diagnosis not present

## 2017-02-16 DIAGNOSIS — N183 Chronic kidney disease, stage 3 (moderate): Secondary | ICD-10-CM | POA: Diagnosis not present

## 2017-03-05 NOTE — Progress Notes (Signed)
Personally  participated in, made any corrections needed, and agree with history, physical, neuro exam,assessment and plan as stated above.    Connor Foxworthy, MD [facility] 

## 2017-03-16 DIAGNOSIS — R600 Localized edema: Secondary | ICD-10-CM | POA: Diagnosis not present

## 2017-03-16 DIAGNOSIS — J300 Vasomotor rhinitis: Secondary | ICD-10-CM | POA: Diagnosis not present

## 2017-03-16 DIAGNOSIS — I10 Essential (primary) hypertension: Secondary | ICD-10-CM | POA: Diagnosis not present

## 2017-05-16 DIAGNOSIS — J3089 Other allergic rhinitis: Secondary | ICD-10-CM | POA: Diagnosis not present

## 2017-05-16 DIAGNOSIS — J300 Vasomotor rhinitis: Secondary | ICD-10-CM | POA: Diagnosis not present

## 2017-05-17 DIAGNOSIS — G4733 Obstructive sleep apnea (adult) (pediatric): Secondary | ICD-10-CM | POA: Diagnosis not present

## 2017-05-24 DIAGNOSIS — Z950 Presence of cardiac pacemaker: Secondary | ICD-10-CM | POA: Diagnosis not present

## 2017-05-24 DIAGNOSIS — E785 Hyperlipidemia, unspecified: Secondary | ICD-10-CM | POA: Diagnosis not present

## 2017-05-24 DIAGNOSIS — G4733 Obstructive sleep apnea (adult) (pediatric): Secondary | ICD-10-CM | POA: Diagnosis not present

## 2017-05-24 DIAGNOSIS — Z955 Presence of coronary angioplasty implant and graft: Secondary | ICD-10-CM | POA: Diagnosis not present

## 2017-05-24 DIAGNOSIS — F0390 Unspecified dementia without behavioral disturbance: Secondary | ICD-10-CM | POA: Diagnosis not present

## 2017-05-24 DIAGNOSIS — I255 Ischemic cardiomyopathy: Secondary | ICD-10-CM | POA: Diagnosis not present

## 2017-05-24 DIAGNOSIS — I2510 Atherosclerotic heart disease of native coronary artery without angina pectoris: Secondary | ICD-10-CM | POA: Diagnosis not present

## 2017-05-24 DIAGNOSIS — N184 Chronic kidney disease, stage 4 (severe): Secondary | ICD-10-CM | POA: Diagnosis not present

## 2017-05-24 DIAGNOSIS — I6523 Occlusion and stenosis of bilateral carotid arteries: Secondary | ICD-10-CM | POA: Diagnosis not present

## 2017-05-24 DIAGNOSIS — I480 Paroxysmal atrial fibrillation: Secondary | ICD-10-CM | POA: Diagnosis not present

## 2017-05-24 DIAGNOSIS — J449 Chronic obstructive pulmonary disease, unspecified: Secondary | ICD-10-CM | POA: Diagnosis not present

## 2017-05-24 DIAGNOSIS — Z7901 Long term (current) use of anticoagulants: Secondary | ICD-10-CM | POA: Diagnosis not present

## 2017-07-06 DIAGNOSIS — F0390 Unspecified dementia without behavioral disturbance: Secondary | ICD-10-CM | POA: Diagnosis not present

## 2017-07-06 DIAGNOSIS — Z950 Presence of cardiac pacemaker: Secondary | ICD-10-CM | POA: Diagnosis not present

## 2017-07-06 DIAGNOSIS — Z955 Presence of coronary angioplasty implant and graft: Secondary | ICD-10-CM | POA: Diagnosis not present

## 2017-07-06 DIAGNOSIS — I480 Paroxysmal atrial fibrillation: Secondary | ICD-10-CM | POA: Diagnosis not present

## 2017-07-06 DIAGNOSIS — G4733 Obstructive sleep apnea (adult) (pediatric): Secondary | ICD-10-CM | POA: Diagnosis not present

## 2017-07-06 DIAGNOSIS — J449 Chronic obstructive pulmonary disease, unspecified: Secondary | ICD-10-CM | POA: Diagnosis not present

## 2017-07-06 DIAGNOSIS — N184 Chronic kidney disease, stage 4 (severe): Secondary | ICD-10-CM | POA: Diagnosis not present

## 2017-07-06 DIAGNOSIS — I6523 Occlusion and stenosis of bilateral carotid arteries: Secondary | ICD-10-CM | POA: Diagnosis not present

## 2017-07-06 DIAGNOSIS — Z7901 Long term (current) use of anticoagulants: Secondary | ICD-10-CM | POA: Diagnosis not present

## 2017-07-06 DIAGNOSIS — E785 Hyperlipidemia, unspecified: Secondary | ICD-10-CM | POA: Diagnosis not present

## 2017-07-06 DIAGNOSIS — I2510 Atherosclerotic heart disease of native coronary artery without angina pectoris: Secondary | ICD-10-CM | POA: Diagnosis not present

## 2017-07-06 DIAGNOSIS — I255 Ischemic cardiomyopathy: Secondary | ICD-10-CM | POA: Diagnosis not present

## 2017-07-17 DIAGNOSIS — D509 Iron deficiency anemia, unspecified: Secondary | ICD-10-CM | POA: Diagnosis not present

## 2017-07-17 DIAGNOSIS — B029 Zoster without complications: Secondary | ICD-10-CM | POA: Diagnosis not present

## 2017-07-17 DIAGNOSIS — I10 Essential (primary) hypertension: Secondary | ICD-10-CM | POA: Diagnosis not present

## 2017-07-17 DIAGNOSIS — E782 Mixed hyperlipidemia: Secondary | ICD-10-CM | POA: Diagnosis not present

## 2017-08-07 ENCOUNTER — Ambulatory Visit: Payer: PPO | Admitting: Neurology

## 2017-08-10 DIAGNOSIS — I10 Essential (primary) hypertension: Secondary | ICD-10-CM | POA: Diagnosis not present

## 2017-08-10 DIAGNOSIS — D509 Iron deficiency anemia, unspecified: Secondary | ICD-10-CM | POA: Diagnosis not present

## 2017-08-10 DIAGNOSIS — E782 Mixed hyperlipidemia: Secondary | ICD-10-CM | POA: Diagnosis not present

## 2017-08-14 DIAGNOSIS — Z23 Encounter for immunization: Secondary | ICD-10-CM | POA: Diagnosis not present

## 2017-08-14 DIAGNOSIS — Z0000 Encounter for general adult medical examination without abnormal findings: Secondary | ICD-10-CM | POA: Diagnosis not present

## 2017-08-22 ENCOUNTER — Ambulatory Visit: Payer: PPO | Admitting: Neurology

## 2017-08-29 DIAGNOSIS — I480 Paroxysmal atrial fibrillation: Secondary | ICD-10-CM | POA: Diagnosis not present

## 2017-08-29 DIAGNOSIS — J440 Chronic obstructive pulmonary disease with acute lower respiratory infection: Secondary | ICD-10-CM | POA: Diagnosis not present

## 2017-08-29 DIAGNOSIS — G4733 Obstructive sleep apnea (adult) (pediatric): Secondary | ICD-10-CM | POA: Diagnosis not present

## 2017-10-02 ENCOUNTER — Encounter (INDEPENDENT_AMBULATORY_CARE_PROVIDER_SITE_OTHER): Payer: Self-pay

## 2017-10-02 ENCOUNTER — Ambulatory Visit (INDEPENDENT_AMBULATORY_CARE_PROVIDER_SITE_OTHER): Payer: PPO | Admitting: Neurology

## 2017-10-02 ENCOUNTER — Encounter: Payer: Self-pay | Admitting: Neurology

## 2017-10-02 VITALS — BP 130/60 | HR 61 | Wt 177.4 lb

## 2017-10-02 DIAGNOSIS — F0150 Vascular dementia without behavioral disturbance: Secondary | ICD-10-CM | POA: Diagnosis not present

## 2017-10-02 NOTE — Patient Instructions (Signed)
I had a long d/w patient about his remote stroke,dementia, risk for recurrent stroke/TIAs, personally independently reviewed imaging studies and stroke evaluation results and answered questions.Continue Eliquis (apixaban) daily  for secondary stroke prevention and maintain strict control of hypertension with blood pressure goal below 130/90, diabetes with hemoglobin A1c goal below 6.5% and lipids with LDL cholesterol goal below 70 mg/dL. continue Namenda and the current dose of 14 mg daily as he is unable to tolerate a higher dose. Patient may consider possible participation in the [place] dementia trial if interested. They'll be given information to review. followup in the future with me in one year or call earlier if necessary

## 2017-10-02 NOTE — Progress Notes (Signed)
[facility]  PATIENT: Joe Wiggins DOB: 09/13/1946   REASON FOR VISIT: Follow-up for stroke/TIA  January 2017, dementia  HISTORY FROM: Patient and wife    HISTORY OF PRESENT ILLNESS: UPDATE 02/01/17 CMMr. [name], 71 year old male returns for follow-up. He had hospital admission for TIA in January 2017. He has past medical history of paroxysmal atrial fibrillation on eliquis hypertension hyperlipidemia coronary artery disease status post CABG, pacemaker,  carotid artery disease, COPD  and dementia. He is currently on Aricept  23 mg daily  He is on Namenda 7 mg and has had trouble titrating this in the past. He denies further stroke or TIA symptoms since hospital discharge. He denies any headache double vision difficulty swallowing, slurred speech. He also has a history of obstructive sleep apnea and uses CPAP at night. He remains on eliquis for atrial fib and  secondary stroke prevention. He has bruising on his hands but no signs of bleeding. He denies any falls and he continues to walk at the mall.Patient is living at home with his wife.  He can dress himself and can shower though under observation. Is not driving or cooking. Appetite is good and he is sleeping well without hallucinations or wandering behavior. No safety issues identified. Last carotid Doppler done in this office 07/14/2016 shows complete occlusion of the right ICA and suspected left VA and abnormally elevated BNP was seen in the right and left ECA consistent with mild stenosis of the vessels. The left ICA stent is elevated BNP was still less than 50% diameter reduction mixed mild severe plaques with acoustic shadowing were noted bilaterally. He continues to follow up with Dr. Donnetta Hutching CV[facility].  Wife  reports he sometimes agitated and she was asked to discuss this with his primary care physician for management .He returns for reevaluation  Update 10/02/2017 : Patient returns for follow-up after last visit 6 months ago. He is a  complaint by his wife. The patient discontinued Aricept since it caused diarrhea and had trouble tolerating it. He has not noticed any cognitive worsening in fact on Mini-Mental testing today scored 23/30 which is improvement from 19/30 at last visit. Patient continues on Namenda 14 mg daily and has not been able to tolerate a higher dose in the past. His short-term memory continues to be poor but he needs only my supervision and is mostly independent with most activities of daily living. He is not had any recurrent TIA or stroke symptoms. His remains on eliquis which is tolerating well with minor bruising but no bleeding episodes. He states his blood pressure is well controlled and today it is 130/60. He has no new complaints. He denies any agitation, delusions, hallucinations or unsafe behavior  REVIEW OF SYSTEMS: Full 14 system review of systems performed and notable only for those listed, all others are neg:   Fatigue, hearing loss, runny nose, excessive eating, blurred vision, anemia, apnea, daytime sleepiness, itching, agitation and all other systems negative  ALLERGIES: Allergies  Allergen Reactions  . Ipratropium Other (See Comments)  . Morphine And Related Nausea Only  . Vicodin [Hydrocodone-Acetaminophen] Other (See Comments)    Hallucinations   . Penicillins Rash    HOME MEDICATIONS: Outpatient Medications Prior to Visit  Medication Sig Dispense Refill  . amLODipine (NORVASC) 5 MG tablet Take 2.5 mg by mouth at bedtime.     Marland Kitchen apixaban (ELIQUIS) 5 MG TABS tablet Take 5 mg by mouth 2 (two) times daily.    Marland Kitchen atorvastatin (LIPITOR) 40 MG tablet  Take 40 mg by mouth daily.    . Cholecalciferol (VITAMIN D3) 3000 UNITS TABS Take 1,000 Units by mouth daily.    . Cyanocobalamin (VITAMIN B 12 PO) Take by mouth daily.    . famotidine (PEPCID) 20 MG tablet Take 20 mg by mouth daily.    . Fluticasone-Salmeterol (ADVAIR) 250-50 MCG/DOSE AEPB Inhale 1 puff into the lungs 2 (two) times daily.      . folic acid (FOLVITE) 017 MCG tablet Take 400 mcg by mouth daily.    . IRON PO Take 65 mg by mouth.    . labetalol (NORMODYNE) 200 MG tablet Take 200 mg by mouth 2 (two) times daily.    Marland Kitchen levalbuterol (XOPENEX) 1.25 MG/3ML nebulizer solution Take 1.25 mg by nebulization as needed for wheezing.    . memantine (NAMENDA XR) 7 MG CP24 24 hr capsule Take 1 capsule (7 mg total) by mouth daily. (Patient taking differently: Take 14 mg by mouth at bedtime. ) 90 capsule 1  . Multiple Vitamin (MULTIVITAMIN) tablet Take 1 tablet by mouth daily.    . nitroGLYCERIN (NITROSTAT) 0.4 MG SL tablet Place 0.4 mg under the tongue every 5 (five) minutes as needed for chest pain.    Marland Kitchen tiotropium (SPIRIVA HANDIHALER) 18 MCG inhalation capsule Place 18 mcg into inhaler and inhale daily.    Marland Kitchen torsemide (DEMADEX) 20 MG tablet Take 20 mg by mouth daily.    . Ascorbic Acid (VITAMIN C) 1000 MG tablet Take 1,000 mg by mouth daily.    Marland Kitchen aspirin 81 MG chewable tablet Chew by mouth daily.    Marland Kitchen donepezil (ARICEPT) 23 MG TABS tablet Take 1 tablet (23 mg total) by mouth at bedtime. (Patient not taking: Reported on 10/02/2017) 90 tablet 3   No facility-administered medications prior to visit.     PAST MEDICAL HISTORY: Past Medical History:  Diagnosis Date  . AF (paroxysmal atrial fibrillation) ([place])   . CAD (coronary artery disease)   . Cardiomyopathy, ischemic   . Carotid artery occlusion   . Chronic kidney disease   . COPD (chronic obstructive pulmonary disease) ([place])   . Dementia without behavioral disturbance   . Hyperlipidemia   . Hypertension   . Sleep apnea   . Stroke ([place])   . TIA (transient ischemic attack)     PAST SURGICAL HISTORY: Past Surgical History:  Procedure Laterality Date  . CARDIAC PACEMAKER PLACEMENT    . CATARACT EXTRACTION    . CORONARY ARTERY BYPASS GRAFT    . CORONARY STENT PLACEMENT    . EYE SURGERY      FAMILY HISTORY: History reviewed. No pertinent family history.  SOCIAL  HISTORY: Social History   Social History  . Marital status: Married    Spouse name: N/A  . Number of children: N/A  . Years of education: N/A   Occupational History  . Not on file.   Social History Main Topics  . Smoking status: Former Smoker    Quit date: 11/30/2012  . Smokeless tobacco: Never Used  . Alcohol use No  . Drug use: No  . Sexual activity: Not on file   Other Topics Concern  . Not on file   Social History Narrative  . No narrative on file     PHYSICAL EXAM  Vitals:   10/02/17 0944  BP: 130/60  Pulse: 61  Weight: 177 lb 6.4 oz (80.5 kg)   Body mass index is 26.2 kg/m.   Generalized: Well developed, in no acute distress ,  well-groomed Head: normocephalic and atraumatic,.  Neck: Supple, no carotid bruits  Cardiac: Regular rate rhythm, no murmur  Musculoskeletal: No deformity  Skin bruising of the hands Neurological examination   Mentation: Alert MMSE 23/30. AFT 9. Clock drawing 3/4.  MMSE - Mini Mental State Exam 10/02/2017 02/01/2017 06/29/2016  Orientation to time 5 4 1   Orientation to Place 5 3 3   Registration 3 3 3   Attention/ Calculation 2 1 3   Recall 0 0 0  Language- name 2 objects 2 2 2   Language- repeat 1 1 1   Language- follow 3 step command 3 3 3   Language- read & follow direction 1 1 1   Write a sentence 1 1 1   Copy design 0 0 1  Total score 23 19 19   .  Follows all commands speech and language fluent.   Cranial nerve II-XII: .Pupils were equal round reactive to light extraocular movements were full, visual field were full on confrontational test. Facial sensation and strength were normal. hearing was intact to finger rubbing bilaterally. Uvula tongue midline. head turning and shoulder shrug were normal and symmetric.Tongue protrusion into cheek strength was normal. Motor: normal bulk and tone, full strength in the BUE, BLE, fine finger movements normal, no pronator drift. No focal weakness Sensory: normal and symmetric to light touch,  pinprick, and  Vibration, In the upper and lower extremities Coordination: finger-nose-finger, heel-to-shin bilaterally, no dysmetria Reflexes: Symmetric upper and lower plantar responses were flexor bilaterally. Gait and Station: Rising up from seated position without assistance, normal stance,  moderate stride, good arm swing, smooth turning, able to perform tiptoe, and heel walking without difficulty. Tandem gait is mildly unsteady. No assistive device  DIAGNOSTIC DATA (LABS, IMAGING, TESTING) - I reviewed patient records, labs, notes, testing and imaging myself where available.   Reviewed recent CMP and lipid profile from 08/18/2016 from care everywhere    ASSESSMENT AND PLAN 52 year Caucasian male with right brain TIA and subacute right frontal subcortical infarct in Jan 2017 secondary to atrial fibrillation with  moderate baseline dementia. Multiple vascular risk factors of atrial fibrillation, hypertension, hyperlipidemia, coronary artery disease, ischemic cardiomyopathy, bilateral extracranial carotid disease and COPD and OSA... Mild dementia which appears to be stable.  PLAN: I had a long d/w patient about his remote stroke,dementia, risk for recurrent stroke/TIAs, personally independently reviewed imaging studies and stroke evaluation results and answered questions.Continue Eliquis (apixaban) daily  for secondary stroke prevention and maintain strict control of hypertension with blood pressure goal below 130/90, diabetes with hemoglobin A1c goal below 6.5% and lipids with LDL cholesterol goal below 70 mg/dL. continue Namenda and the current dose of 14 mg daily as he is unable to tolerate a higher dose. Patient may consider possible participation in the [place] dementia trial if interested. They'll be given information to review. followup in the future with me in one year or call earlier if necessaryI spent 24min  in total face to face time with the patient more than 50% of which was  spent counseling and coordination of care about his dementia and remote stroke, reviewing test results reviewing medications and discussing and reviewing the diagnosis of stroke prevention, atherosclerosis and importance of control of blood pressure blood glucose and lipid levels and continued cessation of smoking    Antony Contras, MD [facility] [address], [place] [address], [place] [address] [phone]

## 2017-10-17 DIAGNOSIS — N184 Chronic kidney disease, stage 4 (severe): Secondary | ICD-10-CM | POA: Diagnosis not present

## 2017-10-23 DIAGNOSIS — D649 Anemia, unspecified: Secondary | ICD-10-CM | POA: Diagnosis not present

## 2017-10-23 DIAGNOSIS — N184 Chronic kidney disease, stage 4 (severe): Secondary | ICD-10-CM | POA: Diagnosis not present

## 2017-10-23 DIAGNOSIS — I10 Essential (primary) hypertension: Secondary | ICD-10-CM | POA: Diagnosis not present

## 2017-11-01 DIAGNOSIS — Z950 Presence of cardiac pacemaker: Secondary | ICD-10-CM | POA: Diagnosis not present

## 2017-11-01 DIAGNOSIS — F0390 Unspecified dementia without behavioral disturbance: Secondary | ICD-10-CM | POA: Diagnosis not present

## 2017-11-01 DIAGNOSIS — G4733 Obstructive sleep apnea (adult) (pediatric): Secondary | ICD-10-CM | POA: Diagnosis not present

## 2017-11-01 DIAGNOSIS — I2510 Atherosclerotic heart disease of native coronary artery without angina pectoris: Secondary | ICD-10-CM | POA: Diagnosis not present

## 2017-11-01 DIAGNOSIS — I255 Ischemic cardiomyopathy: Secondary | ICD-10-CM | POA: Diagnosis not present

## 2017-11-01 DIAGNOSIS — Z7901 Long term (current) use of anticoagulants: Secondary | ICD-10-CM | POA: Diagnosis not present

## 2017-11-01 DIAGNOSIS — J449 Chronic obstructive pulmonary disease, unspecified: Secondary | ICD-10-CM | POA: Diagnosis not present

## 2017-11-01 DIAGNOSIS — I6523 Occlusion and stenosis of bilateral carotid arteries: Secondary | ICD-10-CM | POA: Diagnosis not present

## 2017-11-01 DIAGNOSIS — I480 Paroxysmal atrial fibrillation: Secondary | ICD-10-CM | POA: Diagnosis not present

## 2017-11-01 DIAGNOSIS — N184 Chronic kidney disease, stage 4 (severe): Secondary | ICD-10-CM | POA: Diagnosis not present

## 2017-11-01 DIAGNOSIS — E785 Hyperlipidemia, unspecified: Secondary | ICD-10-CM | POA: Diagnosis not present

## 2017-11-01 DIAGNOSIS — Z955 Presence of coronary angioplasty implant and graft: Secondary | ICD-10-CM | POA: Diagnosis not present

## 2017-11-29 DIAGNOSIS — R17 Unspecified jaundice: Secondary | ICD-10-CM | POA: Diagnosis not present

## 2017-11-29 DIAGNOSIS — H1033 Unspecified acute conjunctivitis, bilateral: Secondary | ICD-10-CM | POA: Diagnosis not present

## 2017-11-29 DIAGNOSIS — J069 Acute upper respiratory infection, unspecified: Secondary | ICD-10-CM | POA: Diagnosis not present

## 2017-12-11 ENCOUNTER — Ambulatory Visit (HOSPITAL_COMMUNITY)
Admission: RE | Admit: 2017-12-11 | Discharge: 2017-12-11 | Disposition: A | Discharge status: Home / Self Care | Payer: PPO | Source: Ambulatory Visit | Attending: Family | Admitting: Family

## 2017-12-11 DIAGNOSIS — I6523 Occlusion and stenosis of bilateral carotid arteries: Secondary | ICD-10-CM | POA: Diagnosis present

## 2017-12-11 DIAGNOSIS — I6522 Occlusion and stenosis of left carotid artery: Secondary | ICD-10-CM | POA: Diagnosis not present

## 2017-12-11 DIAGNOSIS — I6521 Occlusion and stenosis of right carotid artery: Secondary | ICD-10-CM

## 2017-12-11 LAB — VAS US CAROTID
LEFT ECA DIAS: -42 cm/s
LEFT VERTEBRAL DIAS: -26 cm/s
Left CCA dist dias: -29 cm/s
Left CCA dist sys: -81 cm/s
Left CCA prox dias: 24 cm/s
Left CCA prox sys: 84 cm/s
Left ICA dist dias: -47 cm/s
Left ICA dist sys: -133 cm/s
Left ICA prox dias: -49 cm/s
Left ICA prox sys: -152 cm/s
RIGHT CCA MID DIAS: -11 cm/s
RIGHT ECA DIAS: -29 cm/s
RIGHT VERTEBRAL DIAS: 12 cm/s
Right CCA prox dias: 12 cm/s
Right CCA prox sys: 63 cm/s

## 2017-12-12 ENCOUNTER — Encounter (HOSPITAL_COMMUNITY): Payer: PPO

## 2017-12-12 ENCOUNTER — Ambulatory Visit (INDEPENDENT_AMBULATORY_CARE_PROVIDER_SITE_OTHER): Payer: PPO | Admitting: Family

## 2017-12-12 ENCOUNTER — Encounter: Payer: Self-pay | Admitting: Family

## 2017-12-12 ENCOUNTER — Ambulatory Visit: Payer: PPO | Admitting: Family

## 2017-12-12 VITALS — BP 128/63 | HR 73 | Temp 98.2°F | Resp 17 | Wt 174.9 lb

## 2017-12-12 DIAGNOSIS — Z959 Presence of cardiac and vascular implant and graft, unspecified: Secondary | ICD-10-CM

## 2017-12-12 DIAGNOSIS — I6522 Occlusion and stenosis of left carotid artery: Secondary | ICD-10-CM | POA: Diagnosis not present

## 2017-12-12 DIAGNOSIS — I6521 Occlusion and stenosis of right carotid artery: Secondary | ICD-10-CM | POA: Diagnosis not present

## 2017-12-12 NOTE — Progress Notes (Signed)
Chief Complaint: Follow up Extracranial Carotid Artery Stenosis   History of Present Illness  Joe Wiggins is a 71 y.o. male who was initially referred by Dr. Wynonia Lawman to establish care. Dr. Luther Parody initial evaluation was on 12-01-15. The patient is s/p left carotid stenting by Dr. Maryjean Morn in 2013. He suffered a stroke during the procedure and on postoperative day 1. He has a known right internal carotid occlusion. It is unclear whether this was associated with a stroke or not. He has mild neurological deficits including some memory issues, tingling in left fingers and foot, and a slight left foot drop. He denies any amaurosis fugax, receptive or expressive aphasia, facial droop or sudden numbness/weakness of his arms or legs.  It seems that he has had no subsequent stroke or TIA to 2014.   He has a past medical history of paroxysmal atrial fibrillation managed on eliquis. He has CAD s/p CABG. He has hyperlipidemia managed on a statin and hypertension managed on two antihypertensives. He takes a daily aspirin. He is not diabetic. He is a former smoker.  On ROS, he denies any issues with ambulation. He walks around 2 hours a day and only has to rest secondary to feeling tired.    Pt Diabetic: no Pt smoker: former smoker, quit in 2013, started at age 86  Pt meds include: Statin : yes ASA: no Other anticoagulants/antiplatelets: Eliquis   Past Medical History:  Diagnosis Date  . AF (paroxysmal atrial fibrillation) ([place])   . CAD (coronary artery disease)   . Cardiomyopathy, ischemic   . Carotid artery occlusion   . Chronic kidney disease   . COPD (chronic obstructive pulmonary disease) ([place])   . Dementia without behavioral disturbance   . Hyperlipidemia   . Hypertension   . Sleep apnea   . Stroke ([place])   . TIA (transient ischemic attack)     Social History Social History   Tobacco Use  . Smoking status: Former Smoker    Last attempt to quit: 11/30/2012    Years since quitting:  5.0  . Smokeless tobacco: Never Used  Substance Use Topics  . Alcohol use: No  . Drug use: No    Family History No family history on file.  Surgical History Past Surgical History:  Procedure Laterality Date  . CARDIAC PACEMAKER PLACEMENT    . CATARACT EXTRACTION    . CORONARY ARTERY BYPASS GRAFT    . CORONARY STENT PLACEMENT    . EYE SURGERY      Allergies  Allergen Reactions  . Ipratropium Other (See Comments)  . Morphine And Related Nausea Only  . Vicodin [Hydrocodone-Acetaminophen] Other (See Comments)    Hallucinations   . Penicillins Rash    Current Outpatient Medications  Medication Sig Dispense Refill  . albuterol (ACCUNEB) 0.63 MG/3ML nebulizer solution Take 1 ampule by nebulization every 6 (six) hours as needed for Wheezing.    Marland Kitchen albuterol (PROVENTIL HFA;VENTOLIN HFA) 108 (90 Base) MCG/ACT inhaler Inhale into the lungs.    Marland Kitchen amLODipine (NORVASC) 5 MG tablet Take 2.5 mg by mouth at bedtime.     Marland Kitchen apixaban (ELIQUIS) 5 MG TABS tablet Take 5 mg by mouth 2 (two) times daily.    Marland Kitchen atorvastatin (LIPITOR) 40 MG tablet Take 40 mg by mouth daily.    . Cholecalciferol (VITAMIN D3) 3000 UNITS TABS Take 1,000 Units by mouth daily.    . Cyanocobalamin (VITAMIN B 12 PO) Take by mouth daily.    . famotidine (PEPCID) 20 MG  tablet Take 20 mg by mouth daily.    . Fluticasone-Salmeterol (ADVAIR) 250-50 MCG/DOSE AEPB Inhale 1 puff into the lungs 2 (two) times daily.    . folic acid (FOLVITE) 400 MCG tablet Take 400 mcg by mouth daily.    . hydrALAZINE (APRESOLINE) 100 MG tablet     . IRON PO Take 65 mg by mouth.    . labetalol (NORMODYNE) 200 MG tablet Take 200 mg by mouth 2 (two) times daily.    Marland Kitchen levalbuterol (XOPENEX) 1.25 MG/3ML nebulizer solution Take 1.25 mg by nebulization as needed for wheezing.    . memantine (NAMENDA XR) 7 MG CP24 24 hr capsule Take 1 capsule (7 mg total) by mouth daily. (Patient taking differently: Take 14 mg by mouth at bedtime. ) 90 capsule 1  .  Multiple Vitamin (MULTIVITAMIN) tablet Take 1 tablet by mouth daily.    . nitroGLYCERIN (NITROSTAT) 0.4 MG SL tablet Place 0.4 mg under the tongue every 5 (five) minutes as needed for chest pain.    Marland Kitchen tiotropium (SPIRIVA HANDIHALER) 18 MCG inhalation capsule Place 18 mcg into inhaler and inhale daily.    Marland Kitchen torsemide (DEMADEX) 20 MG tablet Take 20 mg by mouth daily.     No current facility-administered medications for this visit.     Review of Systems : See HPI for pertinent positives and negatives.  Physical Examination  Vitals:   12/12/17 1143 12/12/17 1146  BP: 134/60 128/63  Pulse: 73   Resp: 17   Temp: 98.2 F (36.8 C)   TempSrc: Oral   SpO2: 96%   Weight: 174 lb 14.4 oz (79.3 kg)    Body mass index is 25.83 kg/m.  General: WDWN male in NAD GAIT: normal Eyes: PERRLA Neck: Thyromegaly: both lobes of thyroid are enlarged, about 1 1/2 -2x normal size.  Pulmonary:  Respirations are non-labored, good air movement, CTAB Cardiac: regular rhythm, no detected murmur.  VASCULAR EXAM Carotid Bruits Right Left   Positive Positive     Abdominal aortic pulse is not palpable. Radial pulses are 2+ palpable and equal.                                                                                                                                          LE Pulses Right Left       POPLITEAL  not palpable   not palpable       POSTERIOR TIBIAL   palpable    palpable        DORSALIS PEDIS      ANTERIOR TIBIAL faintly palpable  faintly palpable     Gastrointestinal: soft, nontender, BS WNL, no r/g, no palpable masses.  Musculoskeletal: No muscle atrophy/wasting. M/S 5/5 throughout, extremities without ischemic changes.  Neurologic: A&O X 3; appropriate affect, speech is normal, CN 2-12 intact except tongue protrusion is slightly to the left, pain and light touch intact  in extremities, Motor exam as listed above   Assessment: Joe Wiggins is a 71 y.o. male who is s/p  left carotid stenting in 2013. He suffered a stroke during the procedure and on postoperative day 1. He has a known right internal carotid occlusion.  He has not had subsequent strokes or TIA's since 2013.  His atherosclerotic risk factors include former smoker x 54 years, CAD, atrial fib, CKD, COPD, hypertension, and dyslipidemia.  He takes Eliquis and a statin. He walks 2 hours daily.   Pt's wife states that thyroid mass was noted during a hospitalization in 2014, no evaluation of mass was done. Will refer to endocrinologist for evaluation of large vascularized structure in the left thyroid lobe seen on duplex of carotid arteries today; will refer to Dr. Elyse Hsu.   DATA Carotid Duplex  (12/12/17): Right ICA occlusion confirmed.  40-59% stenosis of the left ICA s/p stenting. Velocities may be falsely elevated secondary to contralateral occlusion.  >50% stenosis of the left ECA which is most likely due to the takeoff from the carotid stent.  The bilateral vertebral arteries are antegrade. Increased stenosis of the left ICA compared to the exam on 12-06-16.  Two individual stents identified in the left, in the CCA and ICA. Large vascularized structure seen in the left thyroid lobe.    Plan: Follow-up in 1 year with Carotid Duplex scan.   I discussed in depth with the patient the nature of atherosclerosis, and emphasized the importance of maximal medical management including strict control of blood pressure, blood glucose, and lipid levels, obtaining regular exercise, and continued cessation of smoking.  The patient is aware that without maximal medical management the underlying atherosclerotic disease process will progress, limiting the benefit of any interventions. The patient was given information about stroke prevention and what symptoms should prompt the patient to seek immediate medical care. Thank you for allowing [place] to participate in this patient's care.  Clemon Chambers, RN,  MSN, FNP-C [facility] Office: [phone]  Clinic Physician: Early  12/12/17 12:12 PM

## 2017-12-12 NOTE — Patient Instructions (Signed)

## 2017-12-18 DIAGNOSIS — J069 Acute upper respiratory infection, unspecified: Secondary | ICD-10-CM | POA: Diagnosis not present

## 2017-12-25 ENCOUNTER — Other Ambulatory Visit: Payer: Self-pay

## 2017-12-25 ENCOUNTER — Encounter (HOSPITAL_COMMUNITY): Payer: Self-pay | Admitting: Emergency Medicine

## 2017-12-25 ENCOUNTER — Inpatient Hospital Stay (HOSPITAL_COMMUNITY)
Admission: EM | Admit: 2017-12-25 | Discharge: 2018-01-02 | DRG: 981 | Disposition: A | Discharge status: Skilled Nursing Facility | Payer: PPO | Attending: Family Medicine | Admitting: Family Medicine

## 2017-12-25 ENCOUNTER — Emergency Department (HOSPITAL_COMMUNITY): Payer: PPO

## 2017-12-25 DIAGNOSIS — K219 Gastro-esophageal reflux disease without esophagitis: Secondary | ICD-10-CM | POA: Diagnosis present

## 2017-12-25 DIAGNOSIS — I255 Ischemic cardiomyopathy: Secondary | ICD-10-CM | POA: Diagnosis not present

## 2017-12-25 DIAGNOSIS — Z8674 Personal history of sudden cardiac arrest: Secondary | ICD-10-CM | POA: Diagnosis not present

## 2017-12-25 DIAGNOSIS — N170 Acute kidney failure with tubular necrosis: Secondary | ICD-10-CM | POA: Diagnosis not present

## 2017-12-25 DIAGNOSIS — I472 Ventricular tachycardia: Secondary | ICD-10-CM | POA: Diagnosis present

## 2017-12-25 DIAGNOSIS — D649 Anemia, unspecified: Secondary | ICD-10-CM | POA: Diagnosis not present

## 2017-12-25 DIAGNOSIS — Z7951 Long term (current) use of inhaled steroids: Secondary | ICD-10-CM | POA: Diagnosis not present

## 2017-12-25 DIAGNOSIS — J90 Pleural effusion, not elsewhere classified: Secondary | ICD-10-CM | POA: Diagnosis not present

## 2017-12-25 DIAGNOSIS — E875 Hyperkalemia: Secondary | ICD-10-CM | POA: Diagnosis present

## 2017-12-25 DIAGNOSIS — G4733 Obstructive sleep apnea (adult) (pediatric): Secondary | ICD-10-CM | POA: Diagnosis present

## 2017-12-25 DIAGNOSIS — Z950 Presence of cardiac pacemaker: Secondary | ICD-10-CM

## 2017-12-25 DIAGNOSIS — I4901 Ventricular fibrillation: Secondary | ICD-10-CM | POA: Diagnosis not present

## 2017-12-25 DIAGNOSIS — I5032 Chronic diastolic (congestive) heart failure: Secondary | ICD-10-CM | POA: Diagnosis present

## 2017-12-25 DIAGNOSIS — M6281 Muscle weakness (generalized): Secondary | ICD-10-CM | POA: Diagnosis not present

## 2017-12-25 DIAGNOSIS — Z955 Presence of coronary angioplasty implant and graft: Secondary | ICD-10-CM | POA: Diagnosis not present

## 2017-12-25 DIAGNOSIS — Z95810 Presence of automatic (implantable) cardiac defibrillator: Secondary | ICD-10-CM | POA: Diagnosis not present

## 2017-12-25 DIAGNOSIS — J181 Lobar pneumonia, unspecified organism: Secondary | ICD-10-CM | POA: Diagnosis not present

## 2017-12-25 DIAGNOSIS — Z8249 Family history of ischemic heart disease and other diseases of the circulatory system: Secondary | ICD-10-CM

## 2017-12-25 DIAGNOSIS — I252 Old myocardial infarction: Secondary | ICD-10-CM

## 2017-12-25 DIAGNOSIS — Z79899 Other long term (current) drug therapy: Secondary | ICD-10-CM

## 2017-12-25 DIAGNOSIS — R5381 Other malaise: Secondary | ICD-10-CM | POA: Diagnosis not present

## 2017-12-25 DIAGNOSIS — F419 Anxiety disorder, unspecified: Secondary | ICD-10-CM | POA: Diagnosis present

## 2017-12-25 DIAGNOSIS — R0602 Shortness of breath: Secondary | ICD-10-CM

## 2017-12-25 DIAGNOSIS — R0603 Acute respiratory distress: Secondary | ICD-10-CM | POA: Diagnosis not present

## 2017-12-25 DIAGNOSIS — I2510 Atherosclerotic heart disease of native coronary artery without angina pectoris: Secondary | ICD-10-CM | POA: Diagnosis present

## 2017-12-25 DIAGNOSIS — I361 Nonrheumatic tricuspid (valve) insufficiency: Secondary | ICD-10-CM | POA: Diagnosis not present

## 2017-12-25 DIAGNOSIS — J449 Chronic obstructive pulmonary disease, unspecified: Secondary | ICD-10-CM | POA: Diagnosis not present

## 2017-12-25 DIAGNOSIS — Z8673 Personal history of transient ischemic attack (TIA), and cerebral infarction without residual deficits: Secondary | ICD-10-CM

## 2017-12-25 DIAGNOSIS — N184 Chronic kidney disease, stage 4 (severe): Secondary | ICD-10-CM | POA: Diagnosis present

## 2017-12-25 DIAGNOSIS — J9601 Acute respiratory failure with hypoxia: Secondary | ICD-10-CM

## 2017-12-25 DIAGNOSIS — R05 Cough: Secondary | ICD-10-CM | POA: Diagnosis not present

## 2017-12-25 DIAGNOSIS — F0150 Vascular dementia without behavioral disturbance: Secondary | ICD-10-CM | POA: Diagnosis not present

## 2017-12-25 DIAGNOSIS — J440 Chronic obstructive pulmonary disease with acute lower respiratory infection: Secondary | ICD-10-CM | POA: Diagnosis not present

## 2017-12-25 DIAGNOSIS — I959 Hypotension, unspecified: Secondary | ICD-10-CM | POA: Diagnosis not present

## 2017-12-25 DIAGNOSIS — I462 Cardiac arrest due to underlying cardiac condition: Secondary | ICD-10-CM | POA: Diagnosis not present

## 2017-12-25 DIAGNOSIS — E785 Hyperlipidemia, unspecified: Secondary | ICD-10-CM | POA: Diagnosis present

## 2017-12-25 DIAGNOSIS — E876 Hypokalemia: Secondary | ICD-10-CM | POA: Diagnosis not present

## 2017-12-25 DIAGNOSIS — E871 Hypo-osmolality and hyponatremia: Secondary | ICD-10-CM | POA: Diagnosis present

## 2017-12-25 DIAGNOSIS — J189 Pneumonia, unspecified organism: Secondary | ICD-10-CM | POA: Diagnosis present

## 2017-12-25 DIAGNOSIS — I480 Paroxysmal atrial fibrillation: Secondary | ICD-10-CM | POA: Diagnosis present

## 2017-12-25 DIAGNOSIS — I10 Essential (primary) hypertension: Secondary | ICD-10-CM | POA: Diagnosis not present

## 2017-12-25 DIAGNOSIS — J13 Pneumonia due to Streptococcus pneumoniae: Principal | ICD-10-CM | POA: Diagnosis present

## 2017-12-25 DIAGNOSIS — Z951 Presence of aortocoronary bypass graft: Secondary | ICD-10-CM

## 2017-12-25 DIAGNOSIS — G4730 Sleep apnea, unspecified: Secondary | ICD-10-CM | POA: Diagnosis not present

## 2017-12-25 DIAGNOSIS — I130 Hypertensive heart and chronic kidney disease with heart failure and stage 1 through stage 4 chronic kidney disease, or unspecified chronic kidney disease: Secondary | ICD-10-CM | POA: Diagnosis present

## 2017-12-25 DIAGNOSIS — J441 Chronic obstructive pulmonary disease with (acute) exacerbation: Secondary | ICD-10-CM | POA: Diagnosis not present

## 2017-12-25 DIAGNOSIS — I509 Heart failure, unspecified: Secondary | ICD-10-CM

## 2017-12-25 DIAGNOSIS — Y95 Nosocomial condition: Secondary | ICD-10-CM | POA: Diagnosis present

## 2017-12-25 DIAGNOSIS — I469 Cardiac arrest, cause unspecified: Secondary | ICD-10-CM | POA: Diagnosis not present

## 2017-12-25 DIAGNOSIS — Z87891 Personal history of nicotine dependence: Secondary | ICD-10-CM | POA: Diagnosis not present

## 2017-12-25 DIAGNOSIS — N183 Chronic kidney disease, stage 3 (moderate): Secondary | ICD-10-CM | POA: Diagnosis not present

## 2017-12-25 DIAGNOSIS — Z7901 Long term (current) use of anticoagulants: Secondary | ICD-10-CM

## 2017-12-25 DIAGNOSIS — J9600 Acute respiratory failure, unspecified whether with hypoxia or hypercapnia: Secondary | ICD-10-CM

## 2017-12-25 DIAGNOSIS — Z4659 Encounter for fitting and adjustment of other gastrointestinal appliance and device: Secondary | ICD-10-CM

## 2017-12-25 DIAGNOSIS — I25700 Atherosclerosis of coronary artery bypass graft(s), unspecified, with unstable angina pectoris: Secondary | ICD-10-CM | POA: Diagnosis not present

## 2017-12-25 DIAGNOSIS — I5033 Acute on chronic diastolic (congestive) heart failure: Secondary | ICD-10-CM | POA: Diagnosis not present

## 2017-12-25 DIAGNOSIS — I25118 Atherosclerotic heart disease of native coronary artery with other forms of angina pectoris: Secondary | ICD-10-CM | POA: Diagnosis not present

## 2017-12-25 DIAGNOSIS — J810 Acute pulmonary edema: Secondary | ICD-10-CM | POA: Diagnosis not present

## 2017-12-25 DIAGNOSIS — I471 Supraventricular tachycardia: Secondary | ICD-10-CM | POA: Diagnosis not present

## 2017-12-25 DIAGNOSIS — I4891 Unspecified atrial fibrillation: Secondary | ICD-10-CM | POA: Diagnosis not present

## 2017-12-25 DIAGNOSIS — Z4682 Encounter for fitting and adjustment of non-vascular catheter: Secondary | ICD-10-CM | POA: Diagnosis not present

## 2017-12-25 LAB — COMPREHENSIVE METABOLIC PANEL
ALT: 16 U/L — ABNORMAL LOW (ref 17–63)
AST: 24 U/L (ref 15–41)
Albumin: 3.7 g/dL (ref 3.5–5.0)
Alkaline Phosphatase: 120 U/L (ref 38–126)
Anion gap: 9 (ref 5–15)
BUN: 24 mg/dL — ABNORMAL HIGH (ref 6–20)
CO2: 25 mmol/L (ref 22–32)
Calcium: 9.9 mg/dL (ref 8.9–10.3)
Chloride: 98 mmol/L — ABNORMAL LOW (ref 101–111)
Creatinine, Ser: 1.96 mg/dL — ABNORMAL HIGH (ref 0.61–1.24)
GFR calc Af Amer: 38 mL/min — ABNORMAL LOW (ref >60)
GFR calc non Af Amer: 32 mL/min — ABNORMAL LOW (ref >60)
Glucose, Bld: 121 mg/dL — ABNORMAL HIGH (ref 65–99)
Potassium: 4.8 mmol/L (ref 3.5–5.1)
Sodium: 132 mmol/L — ABNORMAL LOW (ref 135–145)
Total Bilirubin: 0.8 mg/dL (ref 0.3–1.2)
Total Protein: 7.1 g/dL (ref 6.5–8.1)

## 2017-12-25 LAB — CBC WITH DIFFERENTIAL/PLATELET
Basophils Absolute: 0 10*3/uL (ref 0.0–0.1)
Basophils Relative: 0 %
Eosinophils Absolute: 0.1 10*3/uL (ref 0.0–0.7)
Eosinophils Relative: 0 %
HCT: 31.1 % — ABNORMAL LOW (ref 39.0–52.0)
Hemoglobin: 10.3 g/dL — ABNORMAL LOW (ref 13.0–17.0)
Lymphocytes Relative: 3 %
Lymphs Abs: 0.5 10*3/uL — ABNORMAL LOW (ref 0.7–4.0)
MCH: 31.7 pg (ref 26.0–34.0)
MCHC: 33.1 g/dL (ref 30.0–36.0)
MCV: 95.7 fL (ref 78.0–100.0)
Monocytes Absolute: 1.3 10*3/uL — ABNORMAL HIGH (ref 0.1–1.0)
Monocytes Relative: 8 %
Neutro Abs: 13.8 10*3/uL — ABNORMAL HIGH (ref 1.7–7.7)
Neutrophils Relative %: 89 %
Platelets: 334 10*3/uL (ref 150–400)
RBC: 3.25 MIL/uL — ABNORMAL LOW (ref 4.22–5.81)
RDW: 15.4 % (ref 11.5–15.5)
WBC: 15.7 10*3/uL — ABNORMAL HIGH (ref 4.0–10.5)

## 2017-12-25 LAB — BRAIN NATRIURETIC PEPTIDE: B Natriuretic Peptide: 325.6 pg/mL — ABNORMAL HIGH (ref 0.0–100.0)

## 2017-12-25 LAB — I-STAT TROPONIN, ED: Troponin i, poc: 0.03 ng/mL (ref 0.00–0.08)

## 2017-12-25 LAB — I-STAT CG4 LACTIC ACID, ED: Lactic Acid, Venous: 1.33 mmol/L (ref 0.5–1.9)

## 2017-12-25 MED ORDER — VANCOMYCIN HCL 10 G IV SOLR
1500.0000 mg | INTRAVENOUS | Status: DC
  Administered 2017-12-25: 1500 mg via INTRAVENOUS
  Filled 2017-12-25: qty 1500

## 2017-12-25 MED ORDER — ENOXAPARIN SODIUM 40 MG/0.4ML ~~LOC~~ SOLN: 40.0000 mg | SUBCUTANEOUS | Status: DC

## 2017-12-25 MED ORDER — DOXYCYCLINE HYCLATE 100 MG PO TABS
100.0000 mg | ORAL_TABLET | Freq: Once | ORAL | Status: AC
  Administered 2017-12-25: 100 mg via ORAL
  Filled 2017-12-25: qty 1

## 2017-12-25 MED ORDER — LABETALOL HCL 200 MG PO TABS
200.0000 mg | ORAL_TABLET | Freq: Two times a day (BID) | ORAL | Status: DC
  Administered 2017-12-25 – 2017-12-27 (×4): 200 mg via ORAL
  Filled 2017-12-25 (×4): qty 1

## 2017-12-25 MED ORDER — DOXYCYCLINE HYCLATE 100 MG PO TABS
100.0000 mg | ORAL_TABLET | Freq: Two times a day (BID) | ORAL | Status: DC
  Administered 2017-12-26 – 2017-12-28 (×5): 100 mg via ORAL
  Filled 2017-12-25 (×5): qty 1

## 2017-12-25 MED ORDER — DEXTROSE 5 % IV SOLN
1.0000 g | INTRAVENOUS | Status: AC
  Administered 2017-12-26 – 2017-12-31 (×6): 1 g via INTRAVENOUS
  Filled 2017-12-25 (×7): qty 10

## 2017-12-25 MED ORDER — ATORVASTATIN CALCIUM 40 MG PO TABS
40.0000 mg | ORAL_TABLET | Freq: Every day | ORAL | Status: DC
  Administered 2017-12-25 – 2018-01-02 (×9): 40 mg via ORAL
  Filled 2017-12-25 (×9): qty 1

## 2017-12-25 MED ORDER — DEXTROSE 5 % IV SOLN
1.0000 g | Freq: Once | INTRAVENOUS | Status: AC
  Administered 2017-12-25: 1 g via INTRAVENOUS
  Filled 2017-12-25: qty 10

## 2017-12-25 MED ORDER — MEMANTINE HCL ER 14 MG PO CP24
14.0000 mg | ORAL_CAPSULE | Freq: Every day | ORAL | Status: DC
  Administered 2017-12-25: 14 mg via ORAL
  Filled 2017-12-25: qty 1

## 2017-12-25 MED ORDER — MOMETASONE FURO-FORMOTEROL FUM 200-5 MCG/ACT IN AERO
2.0000 | INHALATION_SPRAY | Freq: Two times a day (BID) | RESPIRATORY_TRACT | Status: DC
  Administered 2017-12-25: 2 via RESPIRATORY_TRACT
  Filled 2017-12-25: qty 8.8

## 2017-12-25 MED ORDER — ALBUTEROL SULFATE (2.5 MG/3ML) 0.083% IN NEBU
5.0000 mg | INHALATION_SOLUTION | Freq: Once | RESPIRATORY_TRACT | Status: AC
  Administered 2017-12-25: 5 mg via RESPIRATORY_TRACT
  Filled 2017-12-25: qty 6

## 2017-12-25 MED ORDER — TORSEMIDE 20 MG PO TABS: 20.0000 mg | ORAL_TABLET | Freq: Every day | ORAL | Status: DC

## 2017-12-25 MED ORDER — APIXABAN 5 MG PO TABS
5.0000 mg | ORAL_TABLET | Freq: Two times a day (BID) | ORAL | Status: DC
  Administered 2017-12-25: 5 mg via ORAL
  Filled 2017-12-25: qty 1

## 2017-12-25 MED ORDER — AMLODIPINE BESYLATE 2.5 MG PO TABS: 2.5000 mg | ORAL_TABLET | Freq: Every day | ORAL | Status: DC

## 2017-12-25 MED ORDER — FAMOTIDINE 20 MG PO TABS: 20.0000 mg | ORAL_TABLET | Freq: Every day | ORAL | Status: DC

## 2017-12-25 MED ORDER — AZTREONAM 2 G IJ SOLR: 2.0000 g | Freq: Three times a day (TID) | INTRAMUSCULAR | Status: DC

## 2017-12-25 NOTE — H&P (Signed)
[name]: [phone]  [name] Medical record number: [id number] Date of birth: Oct 16, 1946 Age: 71 y.o. Gender: male  Primary Care Provider: Leota Jacobsen, MD Consultants: None Code Status: Full  Chief Complaint: Shortness of breath  Assessment and Plan: Joe Wiggins is a 71 y.o. male presenting with purulent cough, SOB c/w RUL CAP found on CXR. PMH is significant for PAF, HTN, CAD s/p CABG and pacer, CKDIII, COPD, OSA, dementia, h/o TIA/CVA, GERD.  RUL community acquired pneumonia  Hypoxia  COPD  RLL nodule: Acute.  Recent history of viral URI with subsequent purulent sputum production.  Diagnosed by CXR and clinical findings.  Likely post viral.  Does have new oxygen requirement though mild and without respiratory distress.  Incidental RLL nodule found on CXR.  No improvement with albuterol on admission.  Does not seem c/w COPD exacerbation. Wells score 0, PE unlikely. - Admit to telemetry, attending Dr. Erin Hearing - Will treat CAP with 5 days of doxycycline and ceftriaxone - Continuous pulse ox, incentive spirometry - Pending HIV antibody, Legionella and strep pneumo urine antigen - Pending blood culture x2 and sputum stain and culture - Dulera 2 puffs twice daily - Will need outpatient follow-up for RLL irregular node  HTN  CAD s/p CABG and pacemaker: Chronic.  Stable.  Sees Dr. Wynonia Lawman for cardiology management.  No signs of fluid overload on exam. - Telemetry - Continue home amlodipine 2.5 mg daily, Lipitor 40 mg daily, labetalol 200 mg twice daily - Holding home torsemide 20 mg daily  PAF: Chronic.  Stable.  Regular rate and rhythm on admission.  Medically managed. - Morning 12-lead EKG  CKD stage III: Chronic.  Stable.  Baseline creatinine 1.6.  Mildly elevated at 1.96 on admission. - Avoid nephrotoxic agents - Will monitor creatinine - Holding home torsemide 20 mg  daily  Hyponatremia: Acute.  Mild in severity.  Asymptomatic. - Daily BMET - Holding home torsemide 20 mg daily  OSA: Chronic.  Adherent to CPAP daily. - CPAP daily  GERD: Chronic.  Stable. -Pepcid 20 mg daily  H/O TIA/CVA  Vascular dementia: Chronic.  Stable.  No symptoms of visual or auditory hallucination on admission.  No neurological deficits on exam.  Sees Dr. Leonie Man for neurology management. - Continue home Eliquis 5 mg twice daily, Namenda XR 14 mg daily  FEN/GI: Heart healthy diet Prophylaxis: Lovenox SQ  Disposition: Pending transition to oral antibiotic and improvement of hypoxic state  History of Present Illness:  Joe Wiggins is a 71 y.o. male presenting with purulent cough, SOB c/w RUL CAP found on CXR. PMH is significant for PAF, HTN, CAD s/p CABG and pacer, CKDIII, COPD, OSA, dementia, h/o TIA/CVA, GERD.  Viral URI 1 week green phlegm. Smoker 25-30 years. COPD. CABG,   Review Of Systems: See HPI for pertinent.  Review of Systems  Constitutional: Negative for chills, fever and malaise/fatigue.  HENT: Negative for congestion and sore throat.   Eyes: Negative for blurred vision and double vision.  Respiratory: Positive for cough, sputum production and shortness of breath. Negative for hemoptysis and wheezing.   Cardiovascular: Negative for chest pain, palpitations, orthopnea, leg swelling and PND.  Gastrointestinal: Negative for abdominal pain, blood in stool, diarrhea, melena, nausea and vomiting.  Genitourinary: Negative for dysuria, frequency and urgency.  Musculoskeletal: Negative for falls, myalgias and neck pain.  Skin: Negative for itching and rash.  Neurological: Positive for headaches. Negative for focal weakness and weakness.  Patient Active Problem List   Diagnosis Date Noted  . HAP (hospital-acquired pneumonia) 12/25/2017  . CAD (coronary artery disease), native coronary artery 01/20/2016  . Long-term (current) use of anticoagulants 01/20/2016  .  History of TIA (transient ischemic attack) and stroke 01/20/2016  . Sleep apnea   . Cardiac pacemaker in situ   . Anemia 01/17/2016  . Bilateral carotid artery stenosis   . Hyperlipidemia   . Dementia without behavioral disturbance   . Hypertensive heart disease without CHF   . Paroxysmal atrial fibrillation (HCC)   . Chronic kidney disease, stage 3 ([place])   . Chronic obstructive pulmonary disease (HCC)     Past Medical History: Past Medical History:  Diagnosis Date  . AF (paroxysmal atrial fibrillation) ([place])   . CAD (coronary artery disease)   . Cardiomyopathy, ischemic   . Carotid artery occlusion   . Chronic kidney disease   . COPD (chronic obstructive pulmonary disease) ([place])   . Dementia without behavioral disturbance   . Hyperlipidemia   . Hypertension   . Sleep apnea   . Stroke ([place])   . TIA (transient ischemic attack)     Past Surgical History: Past Surgical History:  Procedure Laterality Date  . CARDIAC PACEMAKER PLACEMENT    . CATARACT EXTRACTION    . CORONARY ARTERY BYPASS GRAFT    . CORONARY STENT PLACEMENT    . EYE SURGERY      Social History: Social History   Tobacco Use  . Smoking status: Former Smoker    Last attempt to quit: 11/30/2012    Years since quitting: 5.0  . Smokeless tobacco: Never Used  Substance Use Topics  . Alcohol use: No  . Drug use: No   Additional social history: Lives at home with wife.  Does not drive.  Able to ambulate without need for assistance.  Does have underlying vascular dementia managed by neurology. Please also refer to relevant sections of EMR.  Family History: Family History  Problem Relation Age of Onset  . Hypertension Mother   . Heart disease Mother   . Hypertension Sister   . Heart disease Sister    Allergies and Medications: Allergies  Allergen Reactions  . Ipratropium Other (See Comments)  . Morphine And Related Nausea Only  . Vicodin [Hydrocodone-Acetaminophen] Other (See Comments)     Hallucinations   . Penicillins Rash   No current facility-administered medications on file prior to encounter.    Current Outpatient Medications on File Prior to Encounter  Medication Sig Dispense Refill  . albuterol (ACCUNEB) 0.63 MG/3ML nebulizer solution Take 1 ampule by nebulization every 6 (six) hours as needed for Wheezing.    Marland Kitchen albuterol (PROVENTIL HFA;VENTOLIN HFA) 108 (90 Base) MCG/ACT inhaler Inhale into the lungs.    Marland Kitchen apixaban (ELIQUIS) 5 MG TABS tablet Take 5 mg by mouth 2 (two) times daily.    Marland Kitchen atorvastatin (LIPITOR) 40 MG tablet Take 40 mg by mouth daily.    . Cholecalciferol (VITAMIN D3) 3000 UNITS TABS Take 1,000 Units by mouth daily.    . Cyanocobalamin (VITAMIN B 12 PO) Take by mouth daily.    Marland Kitchen donepezil (ARICEPT) 23 MG TABS tablet Take 23 mg by mouth daily.    . famotidine (PEPCID) 20 MG tablet Take 20 mg by mouth daily.    . Fluticasone-Salmeterol (ADVAIR) 250-50 MCG/DOSE AEPB Inhale 1 puff into the lungs 2 (two) times daily.    . folic acid (FOLVITE) 762 MCG tablet Take 400 mcg by mouth daily.    Marland Kitchen  hydrALAZINE (APRESOLINE) 100 MG tablet Take 100 mg by mouth 2 (two) times daily.     . IRON PO Take 65 mg by mouth.    . labetalol (NORMODYNE) 200 MG tablet Take 200 mg by mouth 2 (two) times daily.    Marland Kitchen levalbuterol (XOPENEX) 1.25 MG/3ML nebulizer solution Take 1.25 mg by nebulization as needed for wheezing.    . Multiple Vitamin (MULTIVITAMIN) tablet Take 1 tablet by mouth daily.    Marland Kitchen tiotropium (SPIRIVA HANDIHALER) 18 MCG inhalation capsule Place 18 mcg into inhaler and inhale daily.    Marland Kitchen torsemide (DEMADEX) 20 MG tablet Take 20 mg by mouth daily.    . memantine (NAMENDA XR) 7 MG CP24 24 hr capsule Take 1 capsule (7 mg total) by mouth daily. (Patient not taking: Reported on 12/25/2017) 90 capsule 1  . nitroGLYCERIN (NITROSTAT) 0.4 MG SL tablet Place 0.4 mg under the tongue every 5 (five) minutes as needed for chest pain.      Objective: BP 110/66   Pulse 74   Temp  (S) 99.3 F (37.4 C) (Oral)   Resp 17   SpO2 98%  Exam: General: elderly male, well nourished, well developed, NAD with non-toxic appearance HEENT: normocephalic, atraumatic, moist mucous membranes Neck: supple, non-tender without lymphadenopathy Cardiovascular: regular rate and rhythm without murmurs, rubs, or gallops Lungs: rales at RLL with normal work of breathing on 1 L [place], speaking in full sentences Abdomen: soft, non-tender, non-distended, normoactive bowel sounds Skin: warm, dry, no rashes or lesions, cap refill < 2 seconds Extremities: warm and well perfused, normal tone, no edema Psych: pleasantly demented, non-tremulous, euthymic mood, congruent affect  Labs and Imaging: CBC BMET  Recent Labs  Lab 12/25/17 1113  WBC 15.7*  HGB 10.3*  HCT 31.1*  PLT 334   Recent Labs  Lab 12/25/17 1113  NA 132*  K 4.8  CL 98*  CO2 25  BUN 24*  CREATININE 1.96*  GLUCOSE 121*  CALCIUM 9.9     BNP: 325.6 Troponin: Negative Lactic acid: 1.33 (WNL)  DG CHEST 2 VIEW (12/25/17): 1. Right upper lobe pneumonia. 2. Interval small may irregular nodular density at the right lung base, laterally. Attention to this area on follow-up chest radiographs is recommended. 3. Mild changes of COPD and chronic bronchitis with stable left basilar pleural and parenchymal scarring.  Culbertson Bing, DO 12/25/2017, 5:15 PM PGY-2, [facility] pager: [phone], text pages welcome

## 2017-12-25 NOTE — ED Triage Notes (Signed)
Per EMS- Pt here from home hx of stents MI CHF. PT states several days of cough with green productive mucous, pt states fevers at home. Pt c.o. Of shortness of breath with exertion. Alert and oriented to self and situation, disoriented to year. 91% on room air. Hr 80s.

## 2017-12-25 NOTE — ED Provider Notes (Signed)
[facility] EMERGENCY DEPARTMENT Provider Note   CSN: [id number] Arrival date & time: 12/25/17  1058     History   Chief Complaint Chief Complaint  Patient presents with  . Shortness of Breath    HPI Joe Wiggins is a 71 y.o. male.  71yo male with past medical history including atrial fibrillation, CAD, CKD, COPD, dementia, CVA who presents with cough and shortness of breath.  2 days ago he began having cough productive of green mucus, associated with shortness of breath that is worse with exertion and worse when he tries to lay flat at night.  He denies any associated sore throat, congestion, vomiting, diarrhea, or chest pain.  He denies any sick contacts or recent travel. He has used breathing treatments with some temporary relief.   The history is provided by the patient.  Shortness of Breath     Past Medical History:  Diagnosis Date  . AF (paroxysmal atrial fibrillation) ([place])   . CAD (coronary artery disease)   . Cardiomyopathy, ischemic   . Carotid artery occlusion   . Chronic kidney disease   . COPD (chronic obstructive pulmonary disease) ([place])   . Dementia without behavioral disturbance   . Hyperlipidemia   . Hypertension   . Sleep apnea   . Stroke ([place])   . TIA (transient ischemic attack)     Patient Active Problem List   Diagnosis Date Noted  . CAD (coronary artery disease), native coronary artery 01/20/2016  . Long-term (current) use of anticoagulants 01/20/2016  . History of TIA (transient ischemic attack) and stroke 01/20/2016  . Sleep apnea   . Cardiac pacemaker in situ   . Anemia 01/17/2016  . Bilateral carotid artery stenosis   . Hyperlipidemia   . Dementia without behavioral disturbance   . Hypertensive heart disease without CHF   . Paroxysmal atrial fibrillation (HCC)   . Chronic kidney disease, stage 3 ([place])   . Chronic obstructive pulmonary disease (HCC)     Past Surgical History:  Procedure Laterality Date  . CARDIAC  PACEMAKER PLACEMENT    . CATARACT EXTRACTION    . CORONARY ARTERY BYPASS GRAFT    . CORONARY STENT PLACEMENT    . EYE SURGERY         Home Medications    Prior to Admission medications   Medication Sig Start Date End Date Taking? Authorizing Provider  albuterol (ACCUNEB) 0.63 MG/3ML nebulizer solution Take 1 ampule by nebulization every 6 (six) hours as needed for Wheezing.    [provider]  albuterol (PROVENTIL HFA;VENTOLIN HFA) 108 (90 Base) MCG/ACT inhaler Inhale into the lungs. 06/30/16   [provider]  amLODipine (NORVASC) 5 MG tablet Take 2.5 mg by mouth at bedtime.     [provider]  apixaban (ELIQUIS) 5 MG TABS tablet Take 5 mg by mouth 2 (two) times daily.    [provider]  atorvastatin (LIPITOR) 40 MG tablet Take 40 mg by mouth daily.    [provider]  Cholecalciferol (VITAMIN D3) 3000 UNITS TABS Take 1,000 Units by mouth daily.    [provider]  Cyanocobalamin (VITAMIN B 12 PO) Take by mouth daily.    [provider]  famotidine (PEPCID) 20 MG tablet Take 20 mg by mouth daily.    [provider]  Fluticasone-Salmeterol (ADVAIR) 250-50 MCG/DOSE AEPB Inhale 1 puff into the lungs 2 (two) times daily.    [provider]  folic acid (FOLVITE) 678 MCG tablet Take 400  mcg by mouth daily.    [provider]  hydrALAZINE (APRESOLINE) 100 MG tablet  03/14/17   [provider]  IRON PO Take 65 mg by mouth.    [provider]  labetalol (NORMODYNE) 200 MG tablet Take 200 mg by mouth 2 (two) times daily.    [provider]  levalbuterol Penne Lash) 1.25 MG/3ML nebulizer solution Take 1.25 mg by nebulization as needed for wheezing.    [provider]  memantine (NAMENDA XR) 7 MG CP24 24 hr capsule Take 1 capsule (7 mg total) by mouth daily. Patient taking differently: Take 14 mg by mouth at bedtime.  06/29/16   Dennie Bible, NP  Multiple Vitamin  (MULTIVITAMIN) tablet Take 1 tablet by mouth daily.    [provider]  nitroGLYCERIN (NITROSTAT) 0.4 MG SL tablet Place 0.4 mg under the tongue every 5 (five) minutes as needed for chest pain.    [provider]  tiotropium (SPIRIVA HANDIHALER) 18 MCG inhalation capsule Place 18 mcg into inhaler and inhale daily.    [provider]  torsemide (DEMADEX) 20 MG tablet Take 20 mg by mouth daily.    [provider]    Family History No family history on file.  Social History Social History   Tobacco Use  . Smoking status: Former Smoker    Last attempt to quit: 11/30/2012    Years since quitting: 5.0  . Smokeless tobacco: Never Used  Substance Use Topics  . Alcohol use: No  . Drug use: No     Allergies   Ipratropium; Morphine and related; Vicodin [hydrocodone-acetaminophen]; and Penicillins   Review of Systems Review of Systems  Respiratory: Positive for shortness of breath.    All other systems reviewed and are negative except that which was mentioned in HPI   Physical Exam Updated Vital Signs BP 122/62   Pulse 80   Temp (S) 99.3 F (37.4 C) (Oral)   Resp (!) 21   SpO2 96%   Physical Exam  Constitutional: He is oriented to person, place, and time. He appears well-developed and well-nourished. No distress.  HENT:  Head: Normocephalic and atraumatic.  Moist mucous membranes  Eyes: Conjunctivae are normal. Pupils are equal, round, and reactive to light.  Neck: Neck supple.  Cardiovascular: Normal rate, regular rhythm and normal heart sounds.  No murmur heard. Pulmonary/Chest: Effort normal.  Diminished b/l with occasional end-expiratory wheeze  Abdominal: Soft. Bowel sounds are normal. He exhibits no distension. There is no tenderness.  Musculoskeletal: He exhibits no edema.  Neurological: He is alert and oriented to person, place, and time.  Fluent speech  Skin: Skin is warm and dry.  Psychiatric: He has a normal mood and affect.  Judgment normal.  Nursing note and vitals reviewed.    ED Treatments / Results  Labs (all labs ordered are listed, but only abnormal results are displayed) Labs Reviewed  COMPREHENSIVE METABOLIC PANEL - Abnormal; Notable for the following components:      Result Value   Sodium 132 (*)    Chloride 98 (*)    Glucose, Bld 121 (*)    BUN 24 (*)    Creatinine, Ser 1.96 (*)    ALT 16 (*)    GFR calc non Af Amer 32 (*)    GFR calc Af Amer 38 (*)    All other components within normal limits  CBC WITH DIFFERENTIAL/PLATELET - Abnormal; Notable for the following components:   WBC 15.7 (*)    RBC  3.25 (*)    Hemoglobin 10.3 (*)    HCT 31.1 (*)    Neutro Abs 13.8 (*)    Lymphs Abs 0.5 (*)    Monocytes Absolute 1.3 (*)    All other components within normal limits  BRAIN NATRIURETIC PEPTIDE - Abnormal; Notable for the following components:   B Natriuretic Peptide 325.6 (*)    All other components within normal limits  I-STAT TROPONIN, ED  I-STAT CG4 LACTIC ACID, ED  I-STAT CG4 LACTIC ACID, ED    EKG  EKG Interpretation  Date/Time:  Monday December 25 2017 11:03:45 EST Ventricular Rate:  85 PR Interval:  174 QRS Duration: 112 QT Interval:  396 QTC Calculation: 471 R Axis:   34 Text Interpretation:  Normal sinus rhythm with sinus arrhythmia Septal infarct , age undetermined Abnormal ECG no longer atrial paced rhythm, no ischemic changes Confirmed by Theotis Burrow [phone]) on 12/25/2017 11:10:58 AM       Radiology Dg Chest 2 View  Result Date: 12/25/2017 CLINICAL DATA:  Shortness of breath for the past 2 days. Productive cough. Ex-smoker. EXAM: CHEST  2 VIEW COMPARISON:  01/17/2016. FINDINGS: Borderline enlarged cardiac silhouette. Stable post CABG changes. Stable left subclavian bipolar pacemaker leads. No significant change in probable pleural and parenchymal scarring at the left lung base, laterally. Interval patchy opacity in the right upper lobe. Diffuse peribronchial  thickening and accentuation of the interstitial markings with mild hyperexpansion of the lungs.a there is also an interval small irregular nodular density at the right lateral lung base. Thoracic spine degenerative changes. IMPRESSION: 1. Right upper lobe pneumonia. 2. Interval small may irregular nodular density at the right lung base, laterally. Attention to this area on follow-up chest radiographs is recommended. 3. Mild changes of COPD and chronic bronchitis with stable left basilar pleural and parenchymal scarring. Electronically Signed   By: Claudie Revering M.D.   On: 12/25/2017 11:45    Procedures Procedures (including critical care time)  Medications Ordered in ED Medications  albuterol (PROVENTIL) (2.5 MG/3ML) 0.083% nebulizer solution 5 mg (5 mg Nebulization Given 12/25/17 1217)  cefTRIAXone (ROCEPHIN) 1 g in dextrose 5 % 50 mL IVPB (0 g Intravenous Stopped 12/25/17 1319)  doxycycline (VIBRA-TABS) tablet 100 mg (100 mg Oral Given 12/25/17 1248)     Initial Impression / Assessment and Plan / ED Course  I have reviewed the triage vital signs and the nursing notes.  Pertinent labs & imaging results that were available during my care of the patient were reviewed by me and considered in my medical decision making (see chart for details).     2d productive cough and DOE. Hypoxia requiring 2L on exam but breathing comfortable, remainder of VS stable. No respiratory distress.  Lactate, troponin, and BNP reassuring.  WBC 15.7.  Chest x-ray shows right upper lobe infiltrate which I suspect is the reason for the patient's new hypoxia.  Given his infectious symptoms associated with the infiltrate, I highly doubt PE.  No significant improvement after albuterol therefore I suspect infectious process rather than COPD exacerbation.  Discussed admission w/ Dr. Yisroel Ramming and pt admitted for further care. Final Clinical Impressions(s) / ED Diagnoses   Final diagnoses:  Community acquired pneumonia of  right upper lobe of lung [facility])    ED Discharge Orders    None       Cristofher Livecchi, Wenda Overland, MD 12/25/17 1355

## 2017-12-25 NOTE — Progress Notes (Signed)
Pharmacy Antibiotic Note  Joe Wiggins is a 71 y.o. male admitted on 12/25/2017 with cough, SOB concerning for PNA. Pharmacy was originally asked to dose Vancomycin along with Azactam per MD however this has since been transitioned to Rocephin + Doxycycline  The patient received a dose of Rocephin 1g at 1249 earlier today.  Plan: 1. Vancomycin 1500 mg IV x 1 - consult then d/ced by MD 2. Start Rocephin 1g IV every 24 hours - next dose due 1/1 3. Pharmacy will sign off as no further dose adjustments required at this time.      Temp (24hrs), Avg:99.3 F (37.4 C), Min:99.3 F (37.4 C), Max:99.3 F (37.4 C)  Recent Labs  Lab 12/25/17 1113 12/25/17 1201  WBC 15.7*  --   CREATININE 1.96*  --   LATICACIDVEN  --  1.33    CrCl cannot be calculated (Unknown ideal weight.).    Allergies  Allergen Reactions  . Ipratropium Other (See Comments)  . Morphine And Related Nausea Only  . Vicodin [Hydrocodone-Acetaminophen] Other (See Comments)    Hallucinations   . Penicillins Rash    Antimicrobials this admission: CTX 12/31 >> Doxy 12/31 >> Vanc 12/31 x 1  Dose adjustments this admission:   Microbiology results:   Thank you for allowing pharmacy to be a part of this patient's care.  Alycia Rossetti, PharmD, BCPS Clinical Pharmacist Pager: [phone] Clinical phone for 12/25/2017: [phone] If after 3:30p, please call main pharmacy at: [phone] 12/25/2017 5:58 PM

## 2017-12-25 NOTE — Discharge Summary (Signed)
[facility] Discharge Summary  [name] Medical record number: [id number] Date of birth: 30-Aug-1946 Age: 71 y.o. Gender: male Date of Admission: 12/25/2017  Date of Discharge: 01/02/18 Admitting Physician: Lind Covert, MD  Primary Care Provider: Leota Jacobsen, MD Consultants: None  Indication for Hospitalization: Shortness of breath  Discharge Diagnoses/Problem List:  RUL CAP Acute hypoxia COPD RLL irregular nodule HTN CAD PAF CKDIII Hyponatremia OSA GERD H/o TIA/CVA Vascular dementia  Disposition: SNF  Discharge Condition: Stable, improved  Discharge Exam: please see progress note from day of discharge  Brief Hospital Course:  Joe Wiggins is a 71 y.o. male presenting with purulent cough, SOB c/w RUL CAP found on CXR. PMH is significant for PAF, HTN, CAD s/p CABG and pacer, CKDIII, COPD, OSA, dementia, h/o TIA/CVA, GERD.  Patient endorsing 1 week of viral URI symptoms followed by production of purulent sputum and increased shortness of breath.  Upon arrival to ED, patient was found to have RUL CAP based on CXR.  Patient did not appear in respiratory distress but was mildly hypoxic requiring 1 L [place].  Symptoms do not improve with albuterol suggesting COPD exacerbation unlikely.  Well score 0 making PE less likely.  Patient without signs of fluid overload. patient was started on ceftriaxone and doxycycline.  On 1/1, patient went into SVT then cardiac arrest d/t ventricular fibrillation.  ROSC occurred after 14 mins of resuscitation.  Patient was extubated on 1/2 and had tenuous respiratory status and cardiac arrhythmia on 1/3.  Cardiac catheterization was performed on 1/4 and showed that all grafts were patent.  An ICD was placed on 1/4 as well.  On 1/6, Joe Wiggins was transferred out of the ICU being switched from amiodarone gtt to PO amiodarone 400 mg daily and no longer needing BiPAP for respiratory support.  He was started back  on Eliquis 5 mg BID on 1/7.  His RLL pneumonia was treated with a 7 day course of ceftriaxone, and his COPD was managed with Spiriva and Xopenex as well as Pulmicort.  His paroxysmal atrial fibrillation was treated with metoprolol 25 mg BID, labetalol 200 mg BID, amiodarone 400 mg daily, and Eliquis.  He was evaluated by OT and PT on 1/7, who recommended SNF placement.  On 1/8, patient finished his course of ceftriaxone and was stable for discharge to SNF.  Issues for Follow Up:  1. Patient has uptrending creatinine likely in response to diuresis.  On 1/8, cardiology decreased frequency of lasix to 40 mg PO once daily from BID.  It would be advisable to check creatinine periodically in SNF. 2. Patient will need amiodarone dose reduced to about 200 mg daily at about one month after discharge.  Cardiology will likely manage this.   Significant Procedures: ICD placement, heart catheterization, CPR, intubation, extubation  Significant Labs and Imaging:  Recent Labs  Lab 12/30/17 0339 12/31/17 0238 01/01/18 0249  WBC 8.2 8.4 7.6  HGB 9.0* 8.8* 8.3*  HCT 28.0* 26.7* 24.3*  PLT 278 259 238   Recent Labs  Lab 12/28/17 0436 12/29/17 0101 12/30/17 0339 12/31/17 0238 01/01/18 0249 01/02/18 0241  NA 134* 136 137 134* 137 138  K 3.9 4.4 3.8 3.5 3.9 3.6  CL 103 103 104 103 107 105  CO2 22 20* 22 22 22  21*  GLUCOSE 108* 121* 117* 111* 104* 98  BUN 29* 36* 36* 35* 34* 37*  CREATININE 1.84* 2.04* 1.97* 2.13* 2.00* 2.19*  CALCIUM 9.1 9.6 9.5 9.6  9.4 9.3  MG 2.6* 2.4 2.2 2.1 2.1  --   PHOS  --   --   --  3.7 3.6  --    BNP: 325.6 Troponin: Negative Lactic acid: 1.33 (WNL)  DG CHEST 2 VIEW (12/25/17): 1. Right upper lobe pneumonia. 2. Interval small may irregular nodular density at the right lung base, laterally. Attention to this area on follow-up chest radiographs is recommended. 3. Mild changes of COPD and chronic bronchitis with stable left basilar pleural and parenchymal  scarring.  Results/Tests Pending at Time of Discharge: none  Discharge Medications:  Allergies as of 01/02/2018      Reactions   Ipratropium Other (See Comments)   Morphine And Related Nausea Only   Vicodin [hydrocodone-acetaminophen] Other (See Comments)   Hallucinations   Penicillins Rash      Medication List    STOP taking these medications   torsemide 20 MG tablet Commonly known as:  DEMADEX     TAKE these medications   albuterol 0.63 MG/3ML nebulizer solution Commonly known as:  ACCUNEB Take 1 ampule by nebulization every 6 (six) hours as needed for Wheezing.   albuterol 108 (90 Base) MCG/ACT inhaler Commonly known as:  PROVENTIL HFA;VENTOLIN HFA Inhale into the lungs.   amiodarone 400 MG tablet Commonly known as:  PACERONE Take 1 tablet (400 mg total) by mouth daily. Start taking on:  01/03/2018   aspirin 325 MG tablet Take 1 tablet (325 mg total) by mouth daily. Start taking on:  01/03/2018   atorvastatin 40 MG tablet Commonly known as:  LIPITOR Take 40 mg by mouth daily.   Budesonide 90 MCG/ACT inhaler Inhale 1 puff into the lungs 2 (two) times daily.   donepezil 23 MG Tabs tablet Commonly known as:  ARICEPT Take 23 mg by mouth daily.   ELIQUIS 5 MG Tabs tablet Generic drug:  apixaban Take 5 mg by mouth 2 (two) times daily.   famotidine 20 MG tablet Commonly known as:  PEPCID Take 20 mg by mouth daily.   Fluticasone-Salmeterol 250-50 MCG/DOSE Aepb Commonly known as:  ADVAIR Inhale 1 puff into the lungs 2 (two) times daily.   folic acid 166 MCG tablet Commonly known as:  FOLVITE Take 400 mcg by mouth daily.   furosemide 40 MG tablet Commonly known as:  LASIX Take 1 tablet (40 mg total) by mouth daily. Start taking on:  01/03/2018   hydrALAZINE 100 MG tablet Commonly known as:  APRESOLINE Take 100 mg by mouth 2 (two) times daily.   IRON PO Take 65 mg by mouth.   labetalol 200 MG tablet Commonly known as:  NORMODYNE Take 200 mg by mouth 2  (two) times daily.   levalbuterol 1.25 MG/3ML nebulizer solution Commonly known as:  XOPENEX Take 1.25 mg by nebulization as needed for wheezing.   memantine 7 MG Cp24 24 hr capsule Commonly known as:  NAMENDA XR Take 1 capsule (7 mg total) by mouth daily.   metoprolol succinate 25 MG 24 hr tablet Commonly known as:  TOPROL-XL Take 1 tablet (25 mg total) by mouth 2 (two) times daily with a meal.   multivitamin tablet Take 1 tablet by mouth daily.   nitroGLYCERIN 0.4 MG SL tablet Commonly known as:  NITROSTAT Place 0.4 mg under the tongue every 5 (five) minutes as needed for chest pain.   SPIRIVA HANDIHALER 18 MCG inhalation capsule Generic drug:  tiotropium Place 18 mcg into inhaler and inhale daily.   traMADol 50 MG tablet Commonly known  as:  ULTRAM Take 1 tablet (50 mg total) by mouth every 6 (six) hours as needed for moderate pain.   VITAMIN B 12 PO Take by mouth daily.   Vitamin D3 3000 units Tabs Take 1,000 Units by mouth daily.            Durable Medical Equipment  (From admission, onward)        Start     Ordered   01/02/18 1223  DME Oxygen  Once    Question Answer Comment  Mode or (Route) Nasal cannula   Frequency Continuous (stationary and portable oxygen unit needed)   Oxygen delivery system Gas      01/02/18 1223      Discharge Instructions: Please refer to Patient Instructions section of EMR for full details.  Patient was counseled important signs and symptoms that should prompt return to medical care, changes in medications, dietary instructions, activity restrictions, and follow up appointments.   Follow-Up Appointments:  Contact information for follow-up providers    [place] Follow up on 01/09/2018.   Specialty:  Cardiology Why:  2:30PM, wound check visit Contact information: [address] [place]       Evans Lance, MD Follow up on 04/03/2018.    Specialty:  Cardiology Why:  10:00AM Contact information: [address] [address] [phone]            Contact information for after-discharge care    [facility] .   Service:  Skilled Nursing Contact information: [address] [place]                  Kathrene Alu, MD 01/02/2018, 12:38 PM PGY-1, [place]

## 2017-12-25 NOTE — ED Notes (Signed)
Patient transported to X-ray 

## 2017-12-25 NOTE — ED Notes (Signed)
[facility] [place], [place] @ [phone]

## 2017-12-26 ENCOUNTER — Inpatient Hospital Stay (HOSPITAL_COMMUNITY): Payer: PPO

## 2017-12-26 ENCOUNTER — Inpatient Hospital Stay (HOSPITAL_COMMUNITY): Payer: PPO | Admitting: Certified Registered Nurse Anesthetist

## 2017-12-26 DIAGNOSIS — J181 Lobar pneumonia, unspecified organism: Secondary | ICD-10-CM

## 2017-12-26 DIAGNOSIS — I469 Cardiac arrest, cause unspecified: Secondary | ICD-10-CM

## 2017-12-26 DIAGNOSIS — J189 Pneumonia, unspecified organism: Secondary | ICD-10-CM

## 2017-12-26 DIAGNOSIS — I472 Ventricular tachycardia: Secondary | ICD-10-CM

## 2017-12-26 DIAGNOSIS — I4891 Unspecified atrial fibrillation: Secondary | ICD-10-CM

## 2017-12-26 DIAGNOSIS — I25700 Atherosclerosis of coronary artery bypass graft(s), unspecified, with unstable angina pectoris: Secondary | ICD-10-CM

## 2017-12-26 LAB — BASIC METABOLIC PANEL
Anion gap: 9 (ref 5–15)
Anion gap: 18 — ABNORMAL HIGH (ref 5–15)
BUN: 26 mg/dL — ABNORMAL HIGH (ref 6–20)
BUN: 25 mg/dL — ABNORMAL HIGH (ref 6–20)
CO2: 26 mmol/L (ref 22–32)
CO2: 19 mmol/L — ABNORMAL LOW (ref 22–32)
Calcium: 9.4 mg/dL (ref 8.9–10.3)
Calcium: 9.6 mg/dL (ref 8.9–10.3)
Chloride: 100 mmol/L — ABNORMAL LOW (ref 101–111)
Chloride: 98 mmol/L — ABNORMAL LOW (ref 101–111)
Creatinine, Ser: 1.96 mg/dL — ABNORMAL HIGH (ref 0.61–1.24)
Creatinine, Ser: 1.99 mg/dL — ABNORMAL HIGH (ref 0.61–1.24)
GFR calc Af Amer: 38 mL/min — ABNORMAL LOW (ref >60)
GFR calc Af Amer: 37 mL/min — ABNORMAL LOW (ref >60)
GFR calc non Af Amer: 32 mL/min — ABNORMAL LOW (ref >60)
GFR calc non Af Amer: 32 mL/min — ABNORMAL LOW (ref >60)
Glucose, Bld: 107 mg/dL — ABNORMAL HIGH (ref 65–99)
Glucose, Bld: 159 mg/dL — ABNORMAL HIGH (ref 65–99)
Potassium: 4 mmol/L (ref 3.5–5.1)
Potassium: 4.8 mmol/L (ref 3.5–5.1)
Sodium: 135 mmol/L (ref 135–145)
Sodium: 135 mmol/L (ref 135–145)

## 2017-12-26 LAB — TROPONIN I
Troponin I: <0.03 ng/mL (ref <0.03)
Troponin I: 2.09 ng/mL (ref <0.03)
Troponin I: 6.24 ng/mL (ref <0.03)

## 2017-12-26 LAB — CBC
HCT: 28.1 % — ABNORMAL LOW (ref 39.0–52.0)
Hemoglobin: 9.4 g/dL — ABNORMAL LOW (ref 13.0–17.0)
MCH: 32.6 pg (ref 26.0–34.0)
MCHC: 33.5 g/dL (ref 30.0–36.0)
MCV: 97.6 fL (ref 78.0–100.0)
Platelets: 307 10*3/uL (ref 150–400)
RBC: 2.88 MIL/uL — ABNORMAL LOW (ref 4.22–5.81)
RDW: 15.7 % — ABNORMAL HIGH (ref 11.5–15.5)
WBC: 6.7 10*3/uL (ref 4.0–10.5)

## 2017-12-26 LAB — GLUCOSE, CAPILLARY
Glucose-Capillary: 107 mg/dL — ABNORMAL HIGH (ref 65–99)
Glucose-Capillary: 109 mg/dL — ABNORMAL HIGH (ref 65–99)
Glucose-Capillary: 113 mg/dL — ABNORMAL HIGH (ref 65–99)
Glucose-Capillary: 113 mg/dL — ABNORMAL HIGH (ref 65–99)

## 2017-12-26 LAB — LIPID PANEL
Cholesterol: 146 mg/dL (ref 0–200)
HDL: 32 mg/dL — ABNORMAL LOW (ref >40)
LDL Cholesterol: 81 mg/dL (ref 0–99)
Total CHOL/HDL Ratio: 4.6 RATIO
Triglycerides: 166 mg/dL — ABNORMAL HIGH (ref <150)
VLDL: 33 mg/dL (ref 0–40)

## 2017-12-26 LAB — POCT I-STAT 3, ART BLOOD GAS (G3+)
Acid-base deficit: 3 mmol/L — ABNORMAL HIGH (ref 0.0–2.0)
Bicarbonate: 23.1 mmol/L (ref 20.0–28.0)
O2 Saturation: 100 %
TCO2: 24 mmol/L (ref 22–32)
pCO2 arterial: 46.9 mmHg (ref 32.0–48.0)
pH, Arterial: 7.3 — ABNORMAL LOW (ref 7.350–7.450)
pO2, Arterial: 239 mmHg — ABNORMAL HIGH (ref 83.0–108.0)

## 2017-12-26 LAB — MRSA PCR SCREENING: MRSA by PCR: NEGATIVE

## 2017-12-26 LAB — HEMOGLOBIN A1C
Hgb A1c MFr Bld: 5.6 % (ref 4.8–5.6)
Mean Plasma Glucose: 114.02 mg/dL

## 2017-12-26 LAB — STREP PNEUMONIAE URINARY ANTIGEN: Strep Pneumo Urinary Antigen: POSITIVE — AB

## 2017-12-26 LAB — HIV ANTIBODY (ROUTINE TESTING W REFLEX): HIV Screen 4th Generation wRfx: NONREACTIVE

## 2017-12-26 LAB — HEPARIN LEVEL (UNFRACTIONATED): Heparin Unfractionated: >2.2 IU/mL — ABNORMAL HIGH (ref 0.30–0.70)

## 2017-12-26 LAB — APTT: aPTT: 96 seconds — ABNORMAL HIGH (ref 24–36)

## 2017-12-26 LAB — TSH: TSH: 1.98 u[IU]/mL (ref 0.350–4.500)

## 2017-12-26 LAB — PROCALCITONIN: Procalcitonin: 0.16 ng/mL

## 2017-12-26 LAB — MAGNESIUM: Magnesium: 3.7 mg/dL — ABNORMAL HIGH (ref 1.7–2.4)

## 2017-12-26 MED ORDER — CHLORHEXIDINE GLUCONATE 0.12% ORAL RINSE (MEDLINE KIT)
15.0000 mL | Freq: Two times a day (BID) | OROMUCOSAL | Status: DC
  Administered 2017-12-26 – 2017-12-27 (×3): 15 mL via OROMUCOSAL

## 2017-12-26 MED ORDER — ASPIRIN 325 MG PO TABS
325.0000 mg | ORAL_TABLET | Freq: Every day | ORAL | Status: DC
Start: 2017-12-26 — End: 2018-01-02
  Administered 2017-12-26 – 2018-01-02 (×7): 325 mg via ORAL
  Filled 2017-12-26 (×7): qty 1

## 2017-12-26 MED ORDER — PROPOFOL 1000 MG/100ML IV EMUL
5.0000 ug/kg/min | INTRAVENOUS | Status: DC
  Administered 2017-12-26: 30 ug/kg/min via INTRAVENOUS
  Administered 2017-12-26: 5 ug/kg/min via INTRAVENOUS
  Administered 2017-12-27: 30 ug/kg/min via INTRAVENOUS
  Filled 2017-12-26 (×3): qty 100

## 2017-12-26 MED ORDER — AMIODARONE HCL IN DEXTROSE 360-4.14 MG/200ML-% IV SOLN
60.0000 mg/h | INTRAVENOUS | Status: AC
  Administered 2017-12-26: 60 mg/h via INTRAVENOUS
  Filled 2017-12-26: qty 200

## 2017-12-26 MED ORDER — HEPARIN (PORCINE) IN NACL 100-0.45 UNIT/ML-% IJ SOLN
950.0000 [IU]/h | INTRAMUSCULAR | Status: DC
  Administered 2017-12-26 – 2017-12-27 (×2): 1050 [IU]/h via INTRAVENOUS
  Administered 2017-12-27: 900 [IU]/h via INTRAVENOUS
  Administered 2017-12-28: 950 [IU]/h via INTRAVENOUS
  Filled 2017-12-26 (×3): qty 250

## 2017-12-26 MED ORDER — SODIUM CHLORIDE 0.9 % IV BOLUS (SEPSIS)
500.0000 mL | Freq: Once | INTRAVENOUS | Status: AC
  Administered 2017-12-26: 500 mL via INTRAVENOUS

## 2017-12-26 MED ORDER — DILTIAZEM HCL-DEXTROSE 100-5 MG/100ML-% IV SOLN (PREMIX)
5.0000 mg/h | INTRAVENOUS | Status: DC
Start: 2017-12-26 — End: 2017-12-26
  Administered 2017-12-26: 5 mg/h via INTRAVENOUS
  Filled 2017-12-26: qty 100

## 2017-12-26 MED ORDER — PANTOPRAZOLE SODIUM 40 MG IV SOLR
40.0000 mg | INTRAVENOUS | Status: DC
  Administered 2017-12-26 – 2017-12-31 (×6): 40 mg via INTRAVENOUS
  Filled 2017-12-26 (×6): qty 40

## 2017-12-26 MED ORDER — AMIODARONE HCL IN DEXTROSE 360-4.14 MG/200ML-% IV SOLN
30.0000 mg/h | INTRAVENOUS | Status: DC
  Administered 2017-12-26 – 2017-12-28 (×3): 30 mg/h via INTRAVENOUS
  Filled 2017-12-26 (×4): qty 200

## 2017-12-26 MED ORDER — IPRATROPIUM-ALBUTEROL 0.5-2.5 (3) MG/3ML IN SOLN
3.0000 mL | RESPIRATORY_TRACT | Status: AC
  Administered 2017-12-26: 3 mL via RESPIRATORY_TRACT
  Filled 2017-12-26: qty 3

## 2017-12-26 MED ORDER — IPRATROPIUM-ALBUTEROL 0.5-2.5 (3) MG/3ML IN SOLN
3.0000 mL | Freq: Four times a day (QID) | RESPIRATORY_TRACT | Status: DC
  Administered 2017-12-26 – 2017-12-29 (×13): 3 mL via RESPIRATORY_TRACT
  Filled 2017-12-26 (×4): qty 3
  Filled 2017-12-26: qty 6
  Filled 2017-12-26 (×8): qty 3

## 2017-12-26 MED ORDER — MIDAZOLAM HCL 2 MG/2ML IJ SOLN
INTRAMUSCULAR | Status: AC
  Administered 2017-12-26: 2 mg
  Filled 2017-12-26: qty 2

## 2017-12-26 MED ORDER — DILTIAZEM LOAD VIA INFUSION
5.0000 mg | Freq: Once | INTRAVENOUS | Status: AC
  Administered 2017-12-26: 5 mg via INTRAVENOUS
  Filled 2017-12-26 (×3): qty 5

## 2017-12-26 MED ORDER — MAGNESIUM SULFATE 2 GM/50ML IV SOLN: 2.0000 g | Freq: Once | INTRAVENOUS | Status: DC

## 2017-12-26 MED ORDER — LACTATED RINGERS IV SOLN
INTRAVENOUS | Status: DC
  Administered 2017-12-26 – 2017-12-28 (×3): via INTRAVENOUS

## 2017-12-26 MED ORDER — FENTANYL CITRATE (PF) 100 MCG/2ML IJ SOLN
INTRAMUSCULAR | Status: AC
  Administered 2017-12-26: 100 ug
  Filled 2017-12-26: qty 2

## 2017-12-26 MED ORDER — ORAL CARE MOUTH RINSE
15.0000 mL | OROMUCOSAL | Status: DC
  Administered 2017-12-26 – 2017-12-27 (×11): 15 mL via OROMUCOSAL

## 2017-12-26 NOTE — Progress Notes (Signed)
Family Medicine Teaching Service Daily Progress Note Intern Pager: [phone]  [name] Medical record number: [id number] Date of birth: Nov 04, 1946 Age: 72 y.o. Gender: male  Primary Care Provider: Leota Jacobsen, MD Consultants: CCM Code Status: Full (confirmed on admission)  Pt Overview and Major Events to Date:  12/31: Admit for CAP January 07, 2023: Pt went into SVT, coded, ROSC after 14 mins, intubated, transferred to ICU  Assessment and Plan: Joe Wiggins is a 72 y.o. male presenting with purulent cough, SOB c/w RUL CAP found on CXR. PMH is significant for PAF, HTN, CAD s/p CABG and pacer, CKDIII, COPD, OSA, dementia, h/o TIA/CVA, GERD.  RUL community acquired pneumonia  Hypoxia  COPD  RLL nodule: Acute.  Diagnosed by CXR and clinical findings.  Positive for strep pneumo.  Incidental RLL nodule found on CXR.  No improvement with albuterol on admission.  Not c/w COPD exacerbation. Wells score 0, PE unlikely.  - Transferred to ICU following cardiac arrest requiring intubation (see below) - On day 2 of doxycycline and ceftriaxone - Continuous pulse ox, incentive spirometry - Pending HIV antibody, Legionella urine antigen - Pending blood culture x2 and sputum stain and culture - Dulera 2 puffs twice daily, DuoNeb Q6H - Will need outpatient follow-up for RLL irregular node  SVT  Cardiac arrest: Acute.  Negative troponin and reassuring EKG on admission.  Patient without chest pain or palpitations during hospitalization.  Spontaneous went into SVT around 730 on 2018-01-07.  Was experiencing respiratory distress and subsequently went into PEA.  CODE BLUE was called and ROSC was achieved after 14 minutes.  Patient was intubated and transferred to ICU under CCM care. - CCM consulted, appreciate recommendations - See plan above - Will obtain risk stratification labs  HTN  CAD s/p CABG and pacemaker: Chronic.  Sees Dr. Wynonia Lawman for cardiology management.  No signs of fluid overload on  exam. - Telemetry - Continue home amlodipine 2.5 mg daily, Lipitor 40 mg daily, labetalol 200 mg twice daily - Holding home torsemide 20 mg daily  PAF: Chronic.  Stable.  Regular rate and rhythm on admission.  Medically managed. - Morning 12-lead EKG  CKD stage III: Chronic.  Stable.  Baseline creatinine 1.6.  Mildly elevated at 1.96 on admission. - Avoid nephrotoxic agents - Will monitor creatinine - Holding home torsemide 20 mg daily  Hyponatremia: Acute.  Mild in severity.  Asymptomatic. - Daily BMET - Holding home torsemide 20 mg daily  OSA: Chronic.  Adherent to CPAP daily. - CPAP daily  GERD: Chronic.  Stable. - Pepcid 20 mg daily  H/O TIA/CVA  Vascular dementia: Chronic.  Stable.  No symptoms of visual or auditory hallucination on admission.  No neurological deficits on exam.  Sees Dr. Leonie Man for neurology management. - Continue home Eliquis 5 mg twice daily, Namenda XR 14 mg daily  FEN/GI: NPO while intubated Prophylaxis: Lovenox SQ  Disposition: Will await extubation and improvement of PNA.  Subjective:  Patient able to vocalize through BiPAP this morning he was doing well with less fatigue. Denies fevers or chills, nausea or vomiting, CP, SOB, palpitations, edema. He stated he was tolerating the BiPAP well and had no further questions.  Of note, patient spontaneously went into SVT approximately 15 minutes later and subsequently went into PEA.  CODE BLUE was called and ROSC was achieved after 14 minutes.  Patient was intubated and transferred to ICU.  Objective: Temp:  [98 F (36.7 C)-99.3 F (37.4 C)] 98 F (36.7 C) 01-07-23 0542)  Pulse Rate:  [67-111] 111 (01/01 0738) Resp:  [17-23] 19 (12/31 2205) BP: (99-197)/(34-97) 197/97 (01/01 0738) SpO2:  [93 %-100 %] 93 % (01/01 0738) Weight:  [164 lb 4.8 oz (74.5 kg)-165 lb 4.8 oz (75 kg)] 164 lb 4.8 oz (74.5 kg) (01/01 0542) Physical Exam: General: elderly male, NAD with non-toxic appearance HEENT:  normocephalic, atraumatic, moist mucous membranes, BiPAP on face Neck: supple, non-tender without lymphadenopathy Cardiovascular: regular rate and rhythm without murmurs, rubs, or gallops Lungs: clear to auscultation bilaterally with normal work of breathing with BiPAP speaking in full sentances Abdomen: soft, non-tender, non-distended, normoactive bowel sounds Skin: warm, dry, no rashes or lesions, cap refill < 2 seconds Extremities: warm and well perfused, normal tone, no edema Neuro: grossly intact throughout, no dysarthria  Laboratory: Recent Labs  Lab 12/25/17 1113 12/26/17 0421  WBC 15.7* 6.7  HGB 10.3* 9.4*  HCT 31.1* 28.1*  PLT 334 307   Recent Labs  Lab 12/25/17 1113 12/26/17 0421  NA 132* 135  K 4.8 4.0  CL 98* 100*  CO2 25 26  BUN 24* 26*  CREATININE 1.96* 1.96*  CALCIUM 9.9 9.4  PROT 7.1  --   BILITOT 0.8  --   ALKPHOS 120  --   ALT 16*  --   AST 24  --   GLUCOSE 121* 107*   BNP: 325.6 Troponin: Negative Lactic acid: 1.33 (WNL) Strep pneumo urine antigen: Positive Legionella urine antigen: Pending Blood culture (12/31): Pending Sputum gram and culture: Pending HIV: Non-reactive  Imaging/Diagnostic Tests: DG CHEST 2 VIEW (12/25/17): 1. Right upper lobe pneumonia. 2. Interval small may irregular nodular density at the right lung base, laterally. Attention to this area on follow-up chest radiographs is recommended. 3. Mild changes of COPD and chronic bronchitis with stable left basilar pleural and parenchymal scarring.    [place] Bing, DO 12/26/2017, 8:24 AM PGY-2, [place] Intern pager: [phone], text pages welcome

## 2017-12-26 NOTE — Progress Notes (Signed)
[place] for heparin  Indication: atrial fibrillation  Allergies  Allergen Reactions  . Ipratropium Other (See Comments)  . Morphine And Related Nausea Only  . Vicodin [Hydrocodone-Acetaminophen] Other (See Comments)    Hallucinations   . Penicillins Rash    Patient Measurements: Height: '5\' 7"'  (170.2 cm) Weight: 167 lb 5.3 oz (75.9 kg) IBW/kg (Calculated) : 66.1   Vital Signs: Temp: 98.6 F (37 C) (01/01 1938) Temp Source: Oral (01/01 1938) BP: 144/64 (01/01 2000) Pulse Rate: 57 (01/01 2000)  Labs: Recent Labs    12/25/17 1113 12/26/17 0421 12/26/17 0845 12/26/17 1415 12/26/17 2049  HGB 10.3* 9.4*  --   --   --   HCT 31.1* 28.1*  --   --   --   PLT 334 307  --   --   --   APTT  --   --   --   --  96*  CREATININE 1.96* 1.96* 1.99*  --   --   TROPONINI  --   --  <0.03 2.09*  --     Estimated Creatinine Clearance: 31.4 mL/min (A) (by C-G formula based on SCr of 1.99 mg/dL (H)).   Medical History: Past Medical History:  Diagnosis Date  . AF (paroxysmal atrial fibrillation) ([place])   . CAD (coronary artery disease)   . Cardiomyopathy, ischemic   . Carotid artery occlusion   . Chronic kidney disease   . COPD (chronic obstructive pulmonary disease) ([place])   . Dementia without behavioral disturbance   . Hyperlipidemia   . Hypertension   . Sleep apnea   . Stroke ([place])   . TIA (transient ischemic attack)     Medications:  Medications Prior to Admission  Medication Sig Dispense Refill Last Dose  . albuterol (ACCUNEB) 0.63 MG/3ML nebulizer solution Take 1 ampule by nebulization every 6 (six) hours as needed for Wheezing.   Past Month at Unknown time  . albuterol (PROVENTIL HFA;VENTOLIN HFA) 108 (90 Base) MCG/ACT inhaler Inhale into the lungs.   Past Month at Unknown time  . apixaban (ELIQUIS) 5 MG TABS tablet Take 5 mg by mouth 2 (two) times daily.   12/25/2017 at 7a  . atorvastatin (LIPITOR) 40 MG tablet Take 40 mg by mouth  daily.   12/24/2017 at Unknown time  . Cholecalciferol (VITAMIN D3) 3000 UNITS TABS Take 1,000 Units by mouth daily.   12/25/2017 at Unknown time  . Cyanocobalamin (VITAMIN B 12 PO) Take by mouth daily.   12/25/2017 at Unknown time  . donepezil (ARICEPT) 23 MG TABS tablet Take 23 mg by mouth daily.   12/24/2017 at Unknown time  . famotidine (PEPCID) 20 MG tablet Take 20 mg by mouth daily.   12/24/2017 at Unknown time  . Fluticasone-Salmeterol (ADVAIR) 250-50 MCG/DOSE AEPB Inhale 1 puff into the lungs 2 (two) times daily.   12/25/2017 at Unknown time  . folic acid (FOLVITE) 998 MCG tablet Take 400 mcg by mouth daily.   12/24/2017 at Unknown time  . hydrALAZINE (APRESOLINE) 100 MG tablet Take 100 mg by mouth 2 (two) times daily.    12/24/2017 at Unknown time  . IRON PO Take 65 mg by mouth.   12/24/2017 at Unknown time  . labetalol (NORMODYNE) 200 MG tablet Take 200 mg by mouth 2 (two) times daily.   12/25/2017 at 7a  . levalbuterol (XOPENEX) 1.25 MG/3ML nebulizer solution Take 1.25 mg by nebulization as needed for wheezing.   Past Month at Unknown time  .  Multiple Vitamin (MULTIVITAMIN) tablet Take 1 tablet by mouth daily.   12/25/2017 at Unknown time  . tiotropium (SPIRIVA HANDIHALER) 18 MCG inhalation capsule Place 18 mcg into inhaler and inhale daily.   12/24/2017 at Unknown time  . torsemide (DEMADEX) 20 MG tablet Take 20 mg by mouth daily.   12/25/2017 at Unknown time  . memantine (NAMENDA XR) 7 MG CP24 24 hr capsule Take 1 capsule (7 mg total) by mouth daily. (Patient not taking: Reported on 12/25/2017) 90 capsule 1 Not Taking at Unknown time  . nitroGLYCERIN (NITROSTAT) 0.4 MG SL tablet Place 0.4 mg under the tongue every 5 (five) minutes as needed for chest pain.   unk   Scheduled:  . aspirin  325 mg Oral Daily  . atorvastatin  40 mg Oral q1800  . chlorhexidine gluconate (MEDLINE KIT)  15 mL Mouth Rinse BID  . doxycycline  100 mg Oral Q12H  . ipratropium-albuterol  3 mL Nebulization Q6H   . labetalol  200 mg Oral BID  . mouth rinse  15 mL Mouth Rinse 10 times per day  . pantoprazole (PROTONIX) IV  40 mg Intravenous Q24H    Assessment: 72 yo male s/p cardiac arrest with VDRF. He is on apixaban PTA for afib (last dose was 12/31 at 9:30pm as inpatient). Initial aPTT is therapeutic at 96.  -hg= 9.4, plt= 307  Goal of Therapy:  Heparin level 0.3-0.7 units/ml aPTT 66-102 seconds Monitor platelets by anticoagulation protocol: Yes   Plan:  -Continue heparin at 1050 units/hr -Confirmatory aPTT and heparin level in AM   Albertina Parr, PharmD., BCPS Clinical Pharmacist Pager [phone]

## 2017-12-26 NOTE — Plan of Care (Signed)
  Clinical Measurements: Cardiovascular complication will be avoided 12/26/2017 1250 - Not Progressing by Jenne Campus, RN   Code blue this am Clinical Measurements: Respiratory complications will improve 12/26/2017 1250 - Not Progressing by Jenne Campus, RN   Intubated due to code blue stable on vent

## 2017-12-26 NOTE — Progress Notes (Signed)
Pt seen on monitor in SVT, HR 170. MD at bedside during this time and made aware. After approximately 2 minutes HR decreased to the 90's; MD aware.

## 2017-12-26 NOTE — Progress Notes (Signed)
MD Dr. Pearline Cables made aware of ABG results. No new orders at this time.

## 2017-12-26 NOTE — Progress Notes (Signed)
  MD Dr. Pearline Cables  made aware of BP and map. New order obtained will continue to monitor.

## 2017-12-26 NOTE — Anesthesia Procedure Notes (Addendum)
Procedure Name: Intubation Date/Time: 12/26/2017 7:59 AM Performed by: Leonor Liv, CRNA Pre-anesthesia Checklist: Patient identified, Emergency Drugs available, Suction available and Patient being monitored Patient Re-evaluated:Patient Re-evaluated prior to induction Oxygen Delivery Method: Ambu bag Preoxygenation: Pre-oxygenation with 100% oxygen (CPR in progress. ) Induction Type: Cricoid Pressure applied Ventilation: Mask ventilation without difficulty and Oral airway inserted - appropriate to patient size Laryngoscope Size: McGraph and 4 Grade View: Grade I Tube type: Subglottic suction tube Tube size: 7.5 mm Number of attempts: 1 Airway Equipment and Method: Stylet Placement Confirmation: ETT inserted through vocal cords under direct vision,  positive ETCO2 and breath sounds checked- equal and bilateral (ETCO2 via [employer] monitor) Secured at: 24 cm Tube secured with: Tape Dental Injury: Teeth and Oropharynx as per pre-operative assessment  Comments: Clear secretions in oropharynx noted prior to intubation. RT suctioned OETT post intubation- copious amount of thick green secretions seen. Coarse breath sounds bilaterally.

## 2017-12-26 NOTE — Consult Note (Addendum)
PULMONARY / CRITICAL CARE MEDICINE   Name: Abass Misener MRN: [id number] DOB: 1946/05/28    ADMISSION DATE:  12/25/2017 CONSULTATION DATE:  12/26/2017  REFERRING MD:  Yisroel Ramming  CHIEF COMPLAINT:  S/P Cardiopulmonary arrest.  HISTORY OF PRESENT ILLNESS:   This is a 72 year old with a history of paroxysmal atrial fibrillation, history of CABG, history of COPD, and history of dementia, who was admitted with dyspnea and a productive purulent cough.  There was a right-sided infiltrate on chest x-ray as well as a suggestion of a right lower lobe nodule.  He has been treated appropriately with a combination of Rocephin and doxycycline for community-acquired pneumonia.  Urine strep pneumonia antigen is positive.  This morning he is suffered from a torsades arrest.  I have no history of complaints prior to the arrest.  Resuscitation was rapid, he received 2 rounds of epinephrine and was shocked into atrial fibrillation with rapid ventricular response.  The patient was purposeful following resuscitation and received 100 mcg of fentanyl grams of Versed for sedation prior to being examined by me.  PAST MEDICAL HISTORY :  He  has a past medical history of AF (paroxysmal atrial fibrillation) ([place]), CAD (coronary artery disease), Cardiomyopathy, ischemic, Carotid artery occlusion, Chronic kidney disease, COPD (chronic obstructive pulmonary disease) ([place]), Dementia without behavioral disturbance, Hyperlipidemia, Hypertension, Sleep apnea, Stroke ([place]), and TIA (transient ischemic attack).  PAST SURGICAL HISTORY: He  has a past surgical history that includes Cardiac pacemaker placement; Coronary stent placement; Coronary artery bypass graft; Cataract extraction; and Eye surgery.  Allergies  Allergen Reactions  . Ipratropium Other (See Comments)  . Morphine And Related Nausea Only  . Vicodin [Hydrocodone-Acetaminophen] Other (See Comments)    Hallucinations   . Penicillins Rash    No current  facility-administered medications on file prior to encounter.    Current Outpatient Medications on File Prior to Encounter  Medication Sig  . albuterol (ACCUNEB) 0.63 MG/3ML nebulizer solution Take 1 ampule by nebulization every 6 (six) hours as needed for Wheezing.  [place] Kitchen albuterol (PROVENTIL HFA;VENTOLIN HFA) 108 (90 Base) MCG/ACT inhaler Inhale into the lungs.  [place] Kitchen apixaban (ELIQUIS) 5 MG TABS tablet Take 5 mg by mouth 2 (two) times daily.  [place] Kitchen atorvastatin (LIPITOR) 40 MG tablet Take 40 mg by mouth daily.  . Cholecalciferol (VITAMIN D3) 3000 UNITS TABS Take 1,000 Units by mouth daily.  . Cyanocobalamin (VITAMIN B 12 PO) Take by mouth daily.  Mar[place] Kitchen donepezil (ARICEPT) 23 MG TABS tablet Take 23 mg by mouth daily.  . famotidine (PEPCID) 20 MG tablet Take 20 mg by mouth daily.  . Fluticasone-Salmeterol (ADVAIR) 250-50 MCG/DOSE AEPB Inhale 1 puff into the lungs 2 (two) times daily.  . folic acid (FOLVITE) 701 MCG tablet Take 400 mcg by mouth daily.  . hydrALAZINE (APRESOLINE) 100 MG tablet Take 100 mg by mouth 2 (two) times daily.   . IRON PO Take 65 mg by mouth.  . labetalol (NORMODYNE) 200 MG tablet Take 200 mg by mouth 2 (two) times daily.  [place] Kitchen levalbuterol (XOPENEX) 1.25 MG/3ML nebulizer solution Take 1.25 mg by nebulization as needed for wheezing.  . Multiple Vitamin (MULTIVITAMIN) tablet Take 1 tablet by mouth daily.  [place] Kitchen tiotropium (SPIRIVA HANDIHALER) 18 MCG inhalation capsule Place 18 mcg into inhaler and inhale daily.  [place] Kitchen torsemide (DEMADEX) 20 MG tablet Take 20 mg by mouth daily.  . memantine (NAMENDA XR) 7 MG CP24 24 hr capsule Take 1 capsule (7 mg total) by mouth daily. (Patient not taking: Reported on 12/25/2017)  .  nitroGLYCERIN (NITROSTAT) 0.4 MG SL tablet Place 0.4 mg under the tongue every 5 (five) minutes as needed for chest pain.    FAMILY HISTORY:  His indicated that his mother is deceased. He indicated that his father is deceased. He indicated that the status of his sister is  unknown.   SOCIAL HISTORY: He  reports that he quit smoking about 5 years ago. he has never used smokeless tobacco. He reports that he does not drink alcohol or use drugs.  REVIEW OF SYSTEMS:   Unobtainable  SUBJECTIVE:  Unobtainable  VITAL SIGNS: BP (!) 197/97 (BP Location: Right Arm)   Pulse (!) 111   Temp 98 F (36.7 C) (Oral)   Resp 19   Ht 5\' 7"  (1.702 m)   Wt 164 lb 4.8 oz (74.5 kg) Comment: c scale  SpO2 93%   BMI 25.73 kg/m   HEMODYNAMICS:    VENTILATOR SETTINGS: FiO2 (%):  [100 %] 100 %  INTAKE / OUTPUT: I/O last 3 completed shifts: In: 10 [IV Piggyback:50] Out: 80 [Urine:700]  PHYSICAL EXAMINATION: General: Elderly male who is orally intubated and mechanically ventilated Neuro: Responding to voice.  Moving all fours to noxious stimuli.  Pupils are equal. Cardiovascular: S1 and S2 without murmur rub or gallop.  There is no dependent edema, the feet are pink and warm. Lungs: He is orally intubated.  There is symmetric air movement, no wheezes.  There are some scattered rhonchi. Abdomen: Abdomen is soft without any organomegaly masses tenderness guarding or rebound   LABS:  BMET Recent Labs  Lab 12/25/17 1113 12/26/17 0421  NA 132* 135  K 4.8 4.0  CL 98* 100*  CO2 25 26  BUN 24* 26*  CREATININE 1.96* 1.96*  GLUCOSE 121* 107*    Electrolytes Recent Labs  Lab 12/25/17 1113 12/26/17 0421  CALCIUM 9.9 9.4    CBC Recent Labs  Lab 12/25/17 1113 12/26/17 0421  WBC 15.7* 6.7  HGB 10.3* 9.4*  HCT 31.1* 28.1*  PLT 334 307    Coag's No results for input(s): APTT, INR in the last 168 hours.  Sepsis Markers Recent Labs  Lab 12/25/17 1201  LATICACIDVEN 1.33    ABG No results for input(s): PHART, PCO2ART, PO2ART in the last 168 hours.  Liver Enzymes Recent Labs  Lab 12/25/17 1113  AST 24  ALT 16*  ALKPHOS 120  BILITOT 0.8  ALBUMIN 3.7    Cardiac Enzymes No results for input(s): TROPONINI, PROBNP in the last 168  hours.  Glucose No results for input(s): GLUCAP in the last 168 hours.  Imaging Dg Chest 2 View  Result Date: 12/25/2017 CLINICAL DATA:  Shortness of breath for the past 2 days. Productive cough. Ex-smoker. EXAM: CHEST  2 VIEW COMPARISON:  01/17/2016. FINDINGS: Borderline enlarged cardiac silhouette. Stable post CABG changes. Stable left subclavian bipolar pacemaker leads. No significant change in probable pleural and parenchymal scarring at the left lung base, laterally. Interval patchy opacity in the right upper lobe. Diffuse peribronchial thickening and accentuation of the interstitial markings with mild hyperexpansion of the lungs.a there is also an interval small irregular nodular density at the right lateral lung base. Thoracic spine degenerative changes. IMPRESSION: 1. Right upper lobe pneumonia. 2. Interval small may irregular nodular density at the right lung base, laterally. Attention to this area on follow-up chest radiographs is recommended. 3. Mild changes of COPD and chronic bronchitis with stable left basilar pleural and parenchymal scarring. Electronically Signed   By: Percell Locus.D.  On: 12/25/2017 11:45     STUDIES:   DISCUSSION: This is a 71 year old with COPD who presented with shortness of breath and cough productive of purulent sputum.  Chest x-ray suggested right-sided infiltrate and he was empirically treated for community-acquired pneumonia.  He suffered from a torsades arrest this morning. He required a very brief resuscitation with 2 rounds of epinephrine given.  He was shocked into atrial fibrillation which was a perfusing rhythm.  He was purposeful following resuscitation.  ASSESSMENT / PLAN:  PULMONARY A: He is intubated currently for airway protection and for oxygenation.  The postarrest chest x-ray shows a right-sided infiltrate likely artifact treating.  I will not be changing his empiric antibiotics. Sputum culture has been ordered, as well as serial  pro-calcitonin's.  Of note his urine strep pneumonia antigen is positive.  CARDIOVASCULAR A: Status post torsades arrest resuscitated to atrial fibrillation with rapid ventricular response.  I am most suspicious of light abnormalities, laboratory studies including magnesium.  Avoid QT prolonging agents, ruling out ischemia.  Will be controlling his ventricular rate with diltiazem, targeting a rate between 105, in hoping to avoid prolongation of QT with over aggressive control of his heart rate. He is purposeful following the arrest and I will not be cooling  GASTROINTESTINAL A: GI prophylaxis has been added  NEUROLOGIC A: He is purposeful postarrest and I will not be cooling the patient.  He is grossly nonfocal and I will not be pursuing structural provocation for his arrhythmia in the immediate future.   Greater than 35 minutes has been spent in the care of this patient today  Lars Masson, MD Pulmonary and [place] Pager: [phone]  12/26/2017, 8:50 AM   EKG showing inferior ST depression. Add asa, consult cardiology.

## 2017-12-26 NOTE — Progress Notes (Signed)
CRITICAL VALUE ALERT  Critical Value: Troponin 2.09 Date & Time Notied:  12/26/2017 1539  Provider Notified: Dr. Meda Coffee paged and call returned Orders Received/Actions taken: none at this time continue to monitor.

## 2017-12-26 NOTE — Code Documentation (Signed)
  [name]   MRN: [id number]   Date of Birth/ Sex: 08-02-1946 , male      Admission Date: 12/25/2017  Attending Provider: Lind Covert, MD  Primary Diagnosis: <principal problem not specified>   Indication: Pt was in his usual state of health until this AM, when he was noted to be in Vfib. Code blue was subsequently called. At the time of arrival on scene, ACLS protocol was underway.   Technical Description:  - CPR performance duration:  14 minutes  - Was defibrillation or cardioversion used? Yes   - Was external pacer placed? No  - Was patient intubated pre/post CPR? Yes   Medications Administered: Y = Yes; Blank = No Amiodarone    Atropine    Calcium    Epinephrine  2  Lidocaine    Magnesium  1  Norepinephrine    Phenylephrine    Sodium bicarbonate    Vasopressin     Post CPR evaluation:  - Final Status - Was patient successfully resuscitated ? Yes - What is current rhythm? SVT - What is current hemodynamic status? stable  Miscellaneous Information:  - Labs sent, including: Cmp, troponin, cbc, mag  - Primary team notified?  Yes  - Family Notified? Yes  - Additional notes/ transfer status: Signed off to PCCM     Katherine Roan, MD  12/26/2017, 8:19 AM

## 2017-12-26 NOTE — Progress Notes (Signed)
I was called about troponin 2, the patient is on heparin drip already, we will continue cycling troponin, and decide on further management based on trend. Troponin possibly sec to CPR. Crea 1.9.  Ena Dawley, MD 12/26/2017

## 2017-12-26 NOTE — Progress Notes (Addendum)
0745: CCMD called this RN and stated pt was in VFib. RT at bedside. Staff ran into room with code cart. Code called. Code team at bedside. ACLS in process.  0830: Pt transferred to [facility] ICU. AC and RR at bedside. Report given to receiving RN. Wife awaiting in [facility] waiting room with chaplain.

## 2017-12-26 NOTE — Consult Note (Signed)
CARDIOLOGY CONSULT NOTE   Patient ID: Joe Wiggins MRN: [id number], DOB/AGE: [birth year]   Admit date: 12/25/2017 Date of Consult: 12/26/2017  Primary Physician: Leota Jacobsen, MD Primary Cardiologist: New patient  Reason for consult:  Cardiac arrest  Joe Wiggins is a 72 y.o. male who is being seen today for the evaluation of cardiac arrest at the request of Dr Lake Bells.  Problem List  Past Medical History:  Diagnosis Date  . AF (paroxysmal atrial fibrillation) ([place])   . CAD (coronary artery disease)   . Cardiomyopathy, ischemic   . Carotid artery occlusion   . Chronic kidney disease   . COPD (chronic obstructive pulmonary disease) ([place])   . Dementia without behavioral disturbance   . Hyperlipidemia   . Hypertension   . Sleep apnea   . Stroke ([place])   . TIA (transient ischemic attack)     Past Surgical History:  Procedure Laterality Date  . CARDIAC PACEMAKER PLACEMENT    . CATARACT EXTRACTION    . CORONARY ARTERY BYPASS GRAFT    . CORONARY STENT PLACEMENT    . EYE SURGERY      Allergies  Allergies  Allergen Reactions  . Ipratropium Other (See Comments)  . Morphine And Related Nausea Only  . Vicodin [Hydrocodone-Acetaminophen] Other (See Comments)    Hallucinations   . Penicillins Rash    HPI   This is a 72 year old with a history of paroxysmal atrial fibrillation, history of CABG, s/p PM placement in 2014, history of COPD, and history of dementia, who was admitted with dyspnea and a productive purulent cough, fever and chills x 1 day.  There was a right-sided infiltrate on chest x-ray as well as a suggestion of a right lower lobe nodule.  He has been treated appropriately with a combination of Rocephin and doxycycline for community-acquired pneumonia.  Urine strep pneumonia antigen is positive.  This morning he is suffered from a torsades arrest. He complained of 10/10 chest pain prior to the arrest. No prior history arrest or syncope.  Resuscitation was rapid,  he received 2 rounds of epinephrine and was shocked into atrial fibrillation with rapid ventricular response.  The patient was purposeful following resuscitation and received 100 mcg of fentanyl grams of Versed for sedation, now intubated, sedated.  He went into the a-fib with RVR and was started on diltiazem drip 5 mg/hr, he just cardioverted to SR and is hypotensive.   The patient is being followed by Dr Wynonia Lawman, he  has a past medical history of AF (paroxysmal atrial fibrillation) ([place]), CAD (coronary artery disease), Cardiomyopathy, ischemic, Carotid artery occlusion, Chronic kidney disease, COPD (chronic obstructive pulmonary disease) ([place]), Dementia without behavioral disturbance, Hyperlipidemia, Hypertension, Sleep apnea, Stroke ([place]), and TIA (transient ischemic attack).  Inpatient Medications  . apixaban  5 mg Oral BID  . aspirin  325 mg Oral Daily  . atorvastatin  40 mg Oral q1800  . chlorhexidine gluconate (MEDLINE KIT)  15 mL Mouth Rinse BID  . doxycycline  100 mg Oral Q12H  . famotidine  20 mg Oral Daily  . ipratropium-albuterol  3 mL Nebulization Q6H  . labetalol  200 mg Oral BID  . mouth rinse  15 mL Mouth Rinse 10 times per day  . memantine  14 mg Oral QHS  . mometasone-formoterol  2 puff Inhalation BID  . pantoprazole (PROTONIX) IV  40 mg Intravenous Q24H    Family History Family History  Problem Relation Age of Onset  . Hypertension Mother   .  Heart disease Mother   . Hypertension Sister   . Heart disease Sister      Social History Social History   Socioeconomic History  . Marital status: Married    Spouse name: Not on file  . Number of children: Not on file  . Years of education: Not on file  . Highest education level: Not on file  Social Needs  . Financial resource strain: Not on file  . Food insecurity - worry: Not on file  . Food insecurity - inability: Not on file  . Transportation needs - medical: Not on file  . Transportation needs - non-medical:  Not on file  Occupational History  . Not on file  Tobacco Use  . Smoking status: Former Smoker    Last attempt to quit: 11/30/2012    Years since quitting: 5.0  . Smokeless tobacco: Never Used  Substance and Sexual Activity  . Alcohol use: No  . Drug use: No  . Sexual activity: Not on file  Other Topics Concern  . Not on file  Social History Narrative  . Not on file     Review of Systems  General:  No chills, fever, night sweats or weight changes.  Cardiovascular:  No chest pain, dyspnea on exertion, edema, orthopnea, palpitations, paroxysmal nocturnal dyspnea. Dermatological: No rash, lesions/masses Respiratory: No cough, dyspnea Urologic: No hematuria, dysuria Abdominal:   No nausea, vomiting, diarrhea, bright red blood per rectum, melena, or hematemesis Neurologic:  No visual changes, wkns, changes in mental status. All other systems reviewed and are otherwise negative except as noted above.  Physical Exam  Blood pressure 98/75, pulse (!) 46, temperature 98 F (36.7 C), temperature source Oral, resp. rate (!) 21, height '5\' 7"'  (1.702 m), weight 167 lb 5.3 oz (75.9 kg), SpO2 96 %.  General: intubated, sedated Neck: Supple without bruits or JVD. PM in left thorax, sternotomy scar Lungs:  Resp regular and unlabored, CTA Heart: RRR no s3, s4, or murmurs. Abdomen: Soft, non-tender, non-distended, BS + x 4.  Extremities: No clubbing, cyanosis or edema. DP/PT/Radials 2+ and equal bilaterally.  Labs  Recent Labs    12/26/17 0845  TROPONINI <0.03   Lab Results  Component Value Date   WBC 6.7 12/26/2017   HGB 9.4 (L) 12/26/2017   HCT 28.1 (L) 12/26/2017   MCV 97.6 12/26/2017   PLT 307 12/26/2017    Recent Labs  Lab 12/25/17 1113  12/26/17 0845  NA 132*   < > 135  K 4.8   < > 4.8  CL 98*   < > 98*  CO2 25   < > 19*  BUN 24*   < > 25*  CREATININE 1.96*   < > 1.99*  CALCIUM 9.9   < > 9.6  PROT 7.1  --   --   BILITOT 0.8  --   --   ALKPHOS 120  --   --   ALT  16*  --   --   AST 24  --   --   GLUCOSE 121*   < > 159*   < > = values in this interval not displayed.   Lab Results  Component Value Date   CHOL 146 12/26/2017   HDL 32 (L) 12/26/2017   LDLCALC 81 12/26/2017   TRIG 166 (H) 12/26/2017   No results found for: DDIMER Invalid input(s): POCBNP  Radiology/Studies  Dg Chest 2 View  Result Date: 12/25/2017 CLINICAL DATA:  Shortness of breath for the past  2 days. Productive cough. Ex-smoker. EXAM: CHEST  2 VIEW COMPARISON:  01/17/2016. FINDINGS: Borderline enlarged cardiac silhouette. Stable post CABG changes. Stable left subclavian bipolar pacemaker leads. No significant change in probable pleural and parenchymal scarring at the left lung base, laterally. Interval patchy opacity in the right upper lobe. Diffuse peribronchial thickening and accentuation of the interstitial markings with mild hyperexpansion of the lungs.a there is also an interval small irregular nodular density at the right lateral lung base. Thoracic spine degenerative changes. IMPRESSION: 1. Right upper lobe pneumonia. 2. Interval small may irregular nodular density at the right lung base, laterally. Attention to this area on follow-up chest radiographs is recommended. 3. Mild changes of COPD and chronic bronchitis with stable left basilar pleural and parenchymal scarring. Electronically Signed   By: Claudie Revering M.D.   On: 12/25/2017 11:45   Dg Chest Port 1 View  Result Date: 12/26/2017 CLINICAL DATA:  Cardiac arrest, intubated EXAM: PORTABLE CHEST 1 VIEW COMPARISON:  Chest radiograph from one day prior. FINDINGS: Endotracheal tube tip is 4.7 cm above the carina. Pacer pads overlie the left chest. Intact sternotomy wires. CABG clips overlie the mediastinum. Stable cardiomediastinal silhouette with normal heart size. No pneumothorax. No right pleural effusion. Stable small left pleural effusion. Worsening diffuse patchy parahilar lung opacities. IMPRESSION: 1. Well-positioned  endotracheal tube. 2. Worsening diffuse patchy parahilar lung opacities, probably pulmonary edema, with a component of aspiration/pneumonia not excluded. 3. Stable small left pleural effusion. Electronically Signed   By: Ilona Sorrel M.D.   On: 12/26/2017 08:59   Echocardiogram 01/18/2016 - Left ventricle: The cavity size was normal. There was mild to   moderate concentric hypertrophy. Systolic function was normal.   The estimated ejection fraction was in the range of 50% to 55%.   Wall motion was normal; there were no regional wall motion   abnormalities. Features are consistent with a pseudonormal left   ventricular filling pattern, with concomitant abnormal relaxation   and increased filling pressure (grade 2 diastolic dysfunction).   Doppler parameters are consistent with high ventricular filling   pressure. - Mitral valve: There was mild regurgitation. - Left atrium: The atrium was mildly dilated. - Right ventricle: The cavity size was normal. Wall thickness was   normal. Systolic function was normal. - Tricuspid valve: There was mild regurgitation. - Pulmonary arteries: Systolic pressure was mildly increased. PA   peak pressure: 40 mm Hg (S).  ECG: a-fib with RVR    ASSESSMENT AND PLAN  1. Cardiac arrest - torsades --2 rounds of epinephrine and was shocked into atrial fibrillation with rapid ventricular response.  The patient was purposeful following resuscitation and received 100 mcg of fentanyl grams of Versed for sedation, now intubated, sedated. - start amiodarone drip given hypotension and prior VTs - start magnesium iv - obtain echocardiogram to evaluate LVEF - troponin negative x 1 - this was most probably driven by underlying infection, echo will be helpful, however with underlying CKD stage 4, and dementia he would be a poor cath candidate unless a large troponin elevation of new wall motion abnormalities.  He went into the a-fib with RVR and was started on diltiazem  drip 5 mg/hr, he just cardioverted to SR and is hypotensive.   2. A-fib - start amiodarone drip, no bolus just infusion 3. CAD, s/p CABG - as above 4. CAP - management per primary team 5 CKD stage 4 - baseline 1.5-1.6, now 1.96   Signed, Ena Dawley, MD, [facility] 12/26/2017, 10:29 AM

## 2017-12-26 NOTE — Progress Notes (Signed)
RT note- Responded to code blue call. Patient was currently being BMV by RT. Compressions initiated and continued, patient intubated by anesthesia 7.5/24 secured. BBS equal with rhonchi. Patient then transported to 2H-17.

## 2017-12-26 NOTE — Progress Notes (Signed)
[place] for heparin  Indication: atrial fibrillation  Allergies  Allergen Reactions  . Ipratropium Other (See Comments)  . Morphine And Related Nausea Only  . Vicodin [Hydrocodone-Acetaminophen] Other (See Comments)    Hallucinations   . Penicillins Rash    Patient Measurements: Height: _0  (170.2 cm) Weight: 167 lb 5.3 oz (75.9 kg) IBW/kg (Calculated) : 66.1   Vital Signs: Temp: 98.9 F (37.2 C) (01/01 1145) Temp Source: Oral (01/01 1145) BP: 132/61 (01/01 1145) Pulse Rate: 73 (01/01 1145)  Labs: Recent Labs    12/25/17 1113 12/26/17 0421 12/26/17 0845  HGB 10.3* 9.4*  --   HCT 31.1* 28.1*  --   PLT 334 307  --   CREATININE 1.96* 1.96* 1.99*  TROPONINI  --   --  <0.03    Estimated Creatinine Clearance: 31.4 mL/min (A) (by C-G formula based on SCr of 1.99 mg/dL (H)).   Medical History: Past Medical History:  Diagnosis Date  . AF (paroxysmal atrial fibrillation) ([place])   . CAD (coronary artery disease)   . Cardiomyopathy, ischemic   . Carotid artery occlusion   . Chronic kidney disease   . COPD (chronic obstructive pulmonary disease) ([place])   . Dementia without behavioral disturbance   . Hyperlipidemia   . Hypertension   . Sleep apnea   . Stroke ([place])   . TIA (transient ischemic attack)     Medications:  Medications Prior to Admission  Medication Sig Dispense Refill Last Dose  . albuterol (ACCUNEB) 0.63 MG/3ML nebulizer solution Take 1 ampule by nebulization every 6 (six) hours as needed for Wheezing.   Past Month at Unknown time  . albuterol (PROVENTIL HFA;VENTOLIN HFA) 108 (90 Base) MCG/ACT inhaler Inhale into the lungs.   Past Month at Unknown time  . apixaban (ELIQUIS) 5 MG TABS tablet Take 5 mg by mouth 2 (two) times daily.   12/25/2017 at 7a  . atorvastatin (LIPITOR) 40 MG tablet Take 40 mg by mouth daily.   12/24/2017 at Unknown time  . Cholecalciferol (VITAMIN D3) 3000 UNITS TABS Take 1,000 Units by  mouth daily.   12/25/2017 at Unknown time  . Cyanocobalamin (VITAMIN B 12 PO) Take by mouth daily.   12/25/2017 at Unknown time  . donepezil (ARICEPT) 23 MG TABS tablet Take 23 mg by mouth daily.   12/24/2017 at Unknown time  . famotidine (PEPCID) 20 MG tablet Take 20 mg by mouth daily.   12/24/2017 at Unknown time  . Fluticasone-Salmeterol (ADVAIR) 250-50 MCG/DOSE AEPB Inhale 1 puff into the lungs 2 (two) times daily.   12/25/2017 at Unknown time  . folic acid (FOLVITE) 086 MCG tablet Take 400 mcg by mouth daily.   12/24/2017 at Unknown time  . hydrALAZINE (APRESOLINE) 100 MG tablet Take 100 mg by mouth 2 (two) times daily.    12/24/2017 at Unknown time  . IRON PO Take 65 mg by mouth.   12/24/2017 at Unknown time  . labetalol (NORMODYNE) 200 MG tablet Take 200 mg by mouth 2 (two) times daily.   12/25/2017 at 7a  . levalbuterol (XOPENEX) 1.25 MG/3ML nebulizer solution Take 1.25 mg by nebulization as needed for wheezing.   Past Month at Unknown time  . Multiple Vitamin (MULTIVITAMIN) tablet Take 1 tablet by mouth daily.   12/25/2017 at Unknown time  . tiotropium (SPIRIVA HANDIHALER) 18 MCG inhalation capsule Place 18 mcg into inhaler and inhale daily.   12/24/2017 at Unknown time  . torsemide (DEMADEX) 20 MG tablet  Take 20 mg by mouth daily.   12/25/2017 at Unknown time  . memantine (NAMENDA XR) 7 MG CP24 24 hr capsule Take 1 capsule (7 mg total) by mouth daily. (Patient not taking: Reported on 12/25/2017) 90 capsule 1 Not Taking at Unknown time  . nitroGLYCERIN (NITROSTAT) 0.4 MG SL tablet Place 0.4 mg under the tongue every 5 (five) minutes as needed for chest pain.   unk   Scheduled:  . aspirin  325 mg Oral Daily  . atorvastatin  40 mg Oral q1800  . chlorhexidine gluconate (MEDLINE KIT)  15 mL Mouth Rinse BID  . doxycycline  100 mg Oral Q12H  . ipratropium-albuterol  3 mL Nebulization Q6H  . labetalol  200 mg Oral BID  . mouth rinse  15 mL Mouth Rinse 10 times per day  . pantoprazole  (PROTONIX) IV  40 mg Intravenous Q24H    Assessment: 72 yo male s/p cardiac arrest with VDRF. He is on apixaban PTA for afib (last dose was 12/31 at 9:30pm as inpatient).  -hg= 9.4, plt= 307  Goal of Therapy:  Heparin level 0.3-0.7 units/ml aPTT 66-102 seconds Monitor platelets by anticoagulation protocol: Yes   Plan:  -begin heparin at 1050 units/hr -aPTT and heparin level in 8 hours and daily wth CBC daily  Hildred Laser, Pharm D 12/26/2017 12:22 PM

## 2017-12-26 NOTE — Progress Notes (Signed)
Troponin of 6.24 called into cardiologist [place]. Will continue to monitor

## 2017-12-26 NOTE — Progress Notes (Signed)
Called Dr. Meda Coffee to verify Mag order. MD told not to give mag was 3.7 this am. Will continue to monitor.

## 2017-12-27 ENCOUNTER — Encounter (HOSPITAL_COMMUNITY): Payer: Self-pay | Admitting: *Deleted

## 2017-12-27 ENCOUNTER — Inpatient Hospital Stay (HOSPITAL_COMMUNITY): Payer: PPO

## 2017-12-27 DIAGNOSIS — I361 Nonrheumatic tricuspid (valve) insufficiency: Secondary | ICD-10-CM

## 2017-12-27 DIAGNOSIS — J441 Chronic obstructive pulmonary disease with (acute) exacerbation: Secondary | ICD-10-CM

## 2017-12-27 DIAGNOSIS — I4901 Ventricular fibrillation: Secondary | ICD-10-CM

## 2017-12-27 LAB — MAGNESIUM: Magnesium: 2.8 mg/dL — ABNORMAL HIGH (ref 1.7–2.4)

## 2017-12-27 LAB — BASIC METABOLIC PANEL
Anion gap: 15 (ref 5–15)
Anion gap: 12 (ref 5–15)
BUN: 33 mg/dL — ABNORMAL HIGH (ref 6–20)
BUN: 30 mg/dL — ABNORMAL HIGH (ref 6–20)
CO2: 15 mmol/L — ABNORMAL LOW (ref 22–32)
CO2: 21 mmol/L — ABNORMAL LOW (ref 22–32)
Calcium: 8.9 mg/dL (ref 8.9–10.3)
Calcium: 9.1 mg/dL (ref 8.9–10.3)
Chloride: 104 mmol/L (ref 101–111)
Chloride: 101 mmol/L (ref 101–111)
Creatinine, Ser: 1.94 mg/dL — ABNORMAL HIGH (ref 0.61–1.24)
Creatinine, Ser: 1.94 mg/dL — ABNORMAL HIGH (ref 0.61–1.24)
GFR calc Af Amer: 38 mL/min — ABNORMAL LOW (ref >60)
GFR calc Af Amer: 38 mL/min — ABNORMAL LOW (ref >60)
GFR calc non Af Amer: 33 mL/min — ABNORMAL LOW (ref >60)
GFR calc non Af Amer: 33 mL/min — ABNORMAL LOW (ref >60)
Glucose, Bld: 101 mg/dL — ABNORMAL HIGH (ref 65–99)
Glucose, Bld: 113 mg/dL — ABNORMAL HIGH (ref 65–99)
Potassium: 5 mmol/L (ref 3.5–5.1)
Potassium: 4.2 mmol/L (ref 3.5–5.1)
Sodium: 134 mmol/L — ABNORMAL LOW (ref 135–145)
Sodium: 134 mmol/L — ABNORMAL LOW (ref 135–145)

## 2017-12-27 LAB — GLUCOSE, CAPILLARY
Glucose-Capillary: 98 mg/dL (ref 65–99)
Glucose-Capillary: 101 mg/dL — ABNORMAL HIGH (ref 65–99)
Glucose-Capillary: 99 mg/dL (ref 65–99)
Glucose-Capillary: 107 mg/dL — ABNORMAL HIGH (ref 65–99)
Glucose-Capillary: 103 mg/dL — ABNORMAL HIGH (ref 65–99)

## 2017-12-27 LAB — ECHOCARDIOGRAM COMPLETE
Height: 67 in
Weight: 2701.96 oz

## 2017-12-27 LAB — LEGIONELLA PNEUMOPHILA SEROGP 1 UR AG: L. pneumophila Serogp 1 Ur Ag: NEGATIVE

## 2017-12-27 LAB — CBC
HCT: 28.6 % — ABNORMAL LOW (ref 39.0–52.0)
Hemoglobin: 9.8 g/dL — ABNORMAL LOW (ref 13.0–17.0)
MCH: 34 pg (ref 26.0–34.0)
MCHC: 34.3 g/dL (ref 30.0–36.0)
MCV: 99.3 fL (ref 78.0–100.0)
Platelets: 265 10*3/uL (ref 150–400)
RBC: 2.88 MIL/uL — ABNORMAL LOW (ref 4.22–5.81)
RDW: 15.9 % — ABNORMAL HIGH (ref 11.5–15.5)
WBC: 8.9 10*3/uL (ref 4.0–10.5)

## 2017-12-27 LAB — APTT
aPTT: 108 seconds — ABNORMAL HIGH (ref 24–36)
aPTT: 65 seconds — ABNORMAL HIGH (ref 24–36)

## 2017-12-27 LAB — TROPONIN I: Troponin I: 4.09 ng/mL (ref <0.03)

## 2017-12-27 LAB — HEPARIN LEVEL (UNFRACTIONATED): Heparin Unfractionated: >2.2 IU/mL — ABNORMAL HIGH (ref 0.30–0.70)

## 2017-12-27 MED ORDER — HYDRALAZINE HCL 50 MG PO TABS
100.0000 mg | ORAL_TABLET | Freq: Two times a day (BID) | ORAL | Status: DC
  Administered 2017-12-27 – 2018-01-01 (×12): 100 mg via ORAL
  Filled 2017-12-27 (×12): qty 2

## 2017-12-27 MED ORDER — ORAL CARE MOUTH RINSE
15.0000 mL | Freq: Two times a day (BID) | OROMUCOSAL | Status: DC
  Administered 2017-12-27 – 2017-12-31 (×6): 15 mL via OROMUCOSAL

## 2017-12-27 MED ORDER — TRAMADOL HCL 50 MG PO TABS
50.0000 mg | ORAL_TABLET | Freq: Four times a day (QID) | ORAL | Status: DC | PRN
Start: 2017-12-27 — End: 2018-01-02
  Administered 2017-12-27 – 2018-01-01 (×8): 50 mg via ORAL
  Filled 2017-12-27 (×10): qty 1

## 2017-12-27 MED FILL — Medication: Qty: 1 | Status: AC

## 2017-12-27 NOTE — Progress Notes (Signed)
PULMONARY / CRITICAL CARE MEDICINE   Name: Joe Wiggins MRN: [id number] DOB: 1946/02/11    ADMISSION DATE:  12/25/2017 CONSULTATION DATE:  12/26/2017  REFERRING MD:  Yisroel Ramming  CHIEF COMPLAINT:  S/P Cardiopulmonary arrest.  HISTORY OF PRESENT ILLNESS:   This is a 72 year old with a history of paroxysmal atrial fibrillation, history of CABG, history of COPD, and history of dementia, who was admitted with dyspnea and a productive purulent cough.  There was a right-sided infiltrate on chest x-ray as well as a suggestion of a right lower lobe nodule.  He has been treated appropriately with a combination of Rocephin and doxycycline for community-acquired pneumonia.  Urine strep pneumonia antigen is positive.  This morning he is suffered from a torsades arrest.  I have no history of complaints prior to the arrest.  Resuscitation was rapid, he received 2 rounds of epinephrine and was shocked into atrial fibrillation with rapid ventricular response.  The patient was purposeful following resuscitation and received 100 mcg of fentanyl grams of Versed for sedation prior to being examined by me.  SUBJECTIVE:  No further rhythm disturbance reported, currently on amiodarone infusion.  Diltiazem discontinued in the setting of hypotension Sedation lightened 1/2 a.m., tolerating pressure support at this time  VITAL SIGNS: BP (!) 156/72   Pulse 60   Temp 97.8 F (36.6 C) (Oral)   Resp 17   Ht 5\' 7"  (1.702 m)   Wt 76.6 kg (168 lb 14 oz)   SpO2 100%   BMI 26.45 kg/m   HEMODYNAMICS:   VENTILATOR SETTINGS: Vent Mode: CPAP;PSV FiO2 (%):  [40 %-100 %] 40 % Set Rate:  [16 bmp] 16 bmp Vt Set:  [530 mL] 530 mL PEEP:  [5 cmH20] 5 cmH20 Pressure Support:  [5 cmH20] 5 cmH20 Plateau Pressure:  [13 cmH20-23 cmH20] 13 cmH20  INTAKE / OUTPUT: I/O last 3 completed shifts: In: 2552.4 [I.V.:1942.4; NG/GT:60; IV Piggyback:550] Out: 850 [Urine:700; Emesis/NG output:150]  PHYSICAL EXAMINATION: General:elderly  man, intubated, awake Neuro: awake, follows commands, nods to questions Cardiovascular: regular, no M Lungs: comfortable resp pattern on PSV, VT's 600's, no wheeze MSK: chest wall very tender to palp post CPR Abdomen:soft, NT, + BS EXt: no edema   LABS:  BMET Recent Labs  Lab 12/26/17 0421 12/26/17 0845 12/27/17 0407  NA 135 135 134*  K 4.0 4.8 5.0  CL 100* 98* 104  CO2 26 19* 15*  BUN 26* 25* 33*  CREATININE 1.96* 1.99* 1.94*  GLUCOSE 107* 159* 101*    Electrolytes Recent Labs  Lab 12/26/17 0421 12/26/17 0845 12/27/17 0407  CALCIUM 9.4 9.6 8.9  MG  --  3.7*  --     CBC Recent Labs  Lab 12/25/17 1113 12/26/17 0421 12/27/17 0407  WBC 15.7* 6.7 8.9  HGB 10.3* 9.4* 9.8*  HCT 31.1* 28.1* 28.6*  PLT 334 307 265    Coag's Recent Labs  Lab 12/26/17 2049 12/27/17 0407  APTT 96* 108*    Sepsis Markers Recent Labs  Lab 12/25/17 1201 12/26/17 0845  LATICACIDVEN 1.33  --   PROCALCITON  --  0.16    ABG Recent Labs  Lab 12/26/17 0855  PHART 7.300*  PCO2ART 46.9  PO2ART 239.0*    Liver Enzymes Recent Labs  Lab 12/25/17 1113  AST 24  ALT 16*  ALKPHOS 120  BILITOT 0.8  ALBUMIN 3.7    Cardiac Enzymes Recent Labs  Lab 12/26/17 1415 12/26/17 2049 12/27/17 0407  TROPONINI 2.09* 6.24* 4.09*    Glucose  Recent Labs  Lab 12/26/17 1141 12/26/17 1530 12/26/17 1937 12/26/17 2343 12/27/17 0350 12/27/17 0726  GLUCAP 113* 113* 107* 109* 98 101*    Imaging Dg Chest Port 1 View  Result Date: 12/26/2017 CLINICAL DATA:  Cardiac arrest, intubated EXAM: PORTABLE CHEST 1 VIEW COMPARISON:  Chest radiograph from one day prior. FINDINGS: Endotracheal tube tip is 4.7 cm above the carina. Pacer pads overlie the left chest. Intact sternotomy wires. CABG clips overlie the mediastinum. Stable cardiomediastinal silhouette with normal heart size. No pneumothorax. No right pleural effusion. Stable small left pleural effusion. Worsening diffuse patchy  parahilar lung opacities. IMPRESSION: 1. Well-positioned endotracheal tube. 2. Worsening diffuse patchy parahilar lung opacities, probably pulmonary edema, with a component of aspiration/pneumonia not excluded. 3. Stable small left pleural effusion. Electronically Signed   By: Ilona Sorrel M.D.   On: 12/26/2017 08:59   Dg Abd Portable 1v  Result Date: 12/26/2017 CLINICAL DATA:  Orogastric tube placement EXAM: PORTABLE ABDOMEN - 1 VIEW COMPARISON:  None. FINDINGS: Gastric tube enters the stomach with the tip near the duodenal bulb. Nonobstructive bowel gas pattern. Bilateral airspace disease and small left effusion IMPRESSION: Gastric tube in the region of the duodenal bulb. Nonobstructive bowel gas pattern. Electronically Signed   By: Franchot Gallo M.D.   On: 12/26/2017 11:20     STUDIES:   DISCUSSION: This is a 72 year old with COPD who presented with shortness of breath and cough productive of purulent sputum.  Chest x-ray suggested right-sided infiltrate and he was empirically treated for community-acquired pneumonia.  He suffered from a torsades arrest this morning. He required a very brief resuscitation with 2 rounds of epinephrine given.  He was shocked into atrial fibrillation which was a perfusing rhythm.  He was purposeful following resuscitation.  ASSESSMENT / PLAN:  PULMONARY A: Acute respiratory failure Probable right lower lobe community acquired pneumonia, pneumococcal antigen positive Community acquired pneumonia COPD Assess for possible extubation, currently doing pressure support Antibiotics ceftriaxone, doxycycline for CAP  He is intubated currently for airway protection and for oxygenation.  The postarrest chest x-ray shows a right-sided infiltrate likely artifact treating.  I will not be changing his empiric antibiotics. Sputum culture has been ordered, as well as serial pro-calcitonin's.  Of note his urine strep pneumonia antigen is positive.  CARDIOVASCULAR A:   Polymorphic VT arrest 1/1 Atrial fibrillation, RVR Coronary disease history of CABG Non-ST elevation MI Hypothermia deferred given short duration resuscitation, intact neurological function Amiodarone drip Diltiazem discontinued Heparin infusion  RENAL A: Acute on chronic oliguric renal failure, likely ATN Hyperkalemia Follow serial BMP, Follow urine output. May need to Place Foley catheter to measure precisely   GASTROINTESTINAL A: Stress ulcer prophylaxis Pantoprazole  NEUROLOGIC A:  Sedation for intubation, ventilation Wean propofol and assess neurologic function for possible extubation  Independent CC time 34 minutes  Baltazar Apo, MD, PhD 12/27/2017, 8:46 AM [facility] [phone] or if no answer [phone]

## 2017-12-27 NOTE — Progress Notes (Signed)
ANTICOAGULATION CONSULT NOTE - Follow Up Consult  Pharmacy Consult for heparin Indication: atrial fibrillation  Labs: Recent Labs    12/25/17 1113 12/26/17 0421  12/26/17 0845 12/26/17 1415 12/26/17 2049 12/27/17 0407  HGB 10.3* 9.4*  --   --   --   --  9.8*  HCT 31.1* 28.1*  --   --   --   --  28.6*  PLT 334 307  --   --   --   --  265  APTT  --   --   --   --   --  96* 108*  HEPARINUNFRC  --   --   --   --   --  >2.20* >2.20*  CREATININE 1.96* 1.96*  --  1.99*  --   --  1.94*  TROPONINI  --   --    < > <0.03 2.09* 6.24* 4.09*   < > = values in this interval not displayed.     Assessment: 72yo male above goal on heparin after one PTT at goal.   Goal of Therapy:  aPTT 66-102 seconds   Plan:  Will decrease heparin gtt by 2 units/kg/hr to 900 units/hr and check PTT in 8 hours.   Wynona Neat, PharmD, BCPS  12/27/2017,6:22 AM

## 2017-12-27 NOTE — Progress Notes (Signed)
[place] for heparin  Indication: atrial fibrillation  Allergies  Allergen Reactions  . Ipratropium Other (See Comments)  . Morphine And Related Nausea Only  . Vicodin [Hydrocodone-Acetaminophen] Other (See Comments)    Hallucinations   . Penicillins Rash    Patient Measurements: Height: 5\' 7"  (170.2 cm) Weight: 168 lb 14 oz (76.6 kg) IBW/kg (Calculated) : 66.1   Vital Signs: Temp: 99 F (37.2 C) (01/02 1507) Temp Source: Oral (01/02 1507) BP: 144/69 (01/02 1330) Pulse Rate: 73 (01/02 1330)  Labs: Recent Labs    12/25/17 1113 12/26/17 0421  12/26/17 0845 12/26/17 1415 12/26/17 2049 12/27/17 0407 12/27/17 1344  HGB 10.3* 9.4*  --   --   --   --  9.8*  --   HCT 31.1* 28.1*  --   --   --   --  28.6*  --   PLT 334 307  --   --   --   --  265  --   APTT  --   --   --   --   --  96* 108* 65*  HEPARINUNFRC  --   --   --   --   --  >2.20* >2.20*  --   CREATININE 1.96* 1.96*  --  1.99*  --   --  1.94*  --   TROPONINI  --   --    < > <0.03 2.09* 6.24* 4.09*  --    < > = values in this interval not displayed.    Estimated Creatinine Clearance: 32.2 mL/min (A) (by C-G formula based on SCr of 1.94 mg/dL (H)).   Medical History: Past Medical History:  Diagnosis Date  . AF (paroxysmal atrial fibrillation) ([place])   . CAD (coronary artery disease)   . Cardiomyopathy, ischemic   . Carotid artery occlusion   . Chronic kidney disease   . COPD (chronic obstructive pulmonary disease) ([place])   . Dementia without behavioral disturbance   . Hyperlipidemia   . Hypertension   . Sleep apnea   . Stroke ([place])   . TIA (transient ischemic attack)     Medications:  Medications Prior to Admission  Medication Sig Dispense Refill Last Dose  . albuterol (ACCUNEB) 0.63 MG/3ML nebulizer solution Take 1 ampule by nebulization every 6 (six) hours as needed for Wheezing.   Past Month at Unknown time  . albuterol (PROVENTIL HFA;VENTOLIN HFA) 108 (90  Base) MCG/ACT inhaler Inhale into the lungs.   Past Month at Unknown time  . apixaban (ELIQUIS) 5 MG TABS tablet Take 5 mg by mouth 2 (two) times daily.   12/25/2017 at 7a  . atorvastatin (LIPITOR) 40 MG tablet Take 40 mg by mouth daily.   12/24/2017 at Unknown time  . Cholecalciferol (VITAMIN D3) 3000 UNITS TABS Take 1,000 Units by mouth daily.   12/25/2017 at Unknown time  . Cyanocobalamin (VITAMIN B 12 PO) Take by mouth daily.   12/25/2017 at Unknown time  . donepezil (ARICEPT) 23 MG TABS tablet Take 23 mg by mouth daily.   12/24/2017 at Unknown time  . famotidine (PEPCID) 20 MG tablet Take 20 mg by mouth daily.   12/24/2017 at Unknown time  . Fluticasone-Salmeterol (ADVAIR) 250-50 MCG/DOSE AEPB Inhale 1 puff into the lungs 2 (two) times daily.   12/25/2017 at Unknown time  . folic acid (FOLVITE) 528 MCG tablet Take 400 mcg by mouth daily.   12/24/2017 at Unknown time  . hydrALAZINE (APRESOLINE) 100 MG tablet  Take 100 mg by mouth 2 (two) times daily.    12/24/2017 at Unknown time  . IRON PO Take 65 mg by mouth.   12/24/2017 at Unknown time  . labetalol (NORMODYNE) 200 MG tablet Take 200 mg by mouth 2 (two) times daily.   12/25/2017 at 7a  . levalbuterol (XOPENEX) 1.25 MG/3ML nebulizer solution Take 1.25 mg by nebulization as needed for wheezing.   Past Month at Unknown time  . Multiple Vitamin (MULTIVITAMIN) tablet Take 1 tablet by mouth daily.   12/25/2017 at Unknown time  . tiotropium (SPIRIVA HANDIHALER) 18 MCG inhalation capsule Place 18 mcg into inhaler and inhale daily.   12/24/2017 at Unknown time  . torsemide (DEMADEX) 20 MG tablet Take 20 mg by mouth daily.   12/25/2017 at Unknown time  . memantine (NAMENDA XR) 7 MG CP24 24 hr capsule Take 1 capsule (7 mg total) by mouth daily. (Patient not taking: Reported on 12/25/2017) 90 capsule 1 Not Taking at Unknown time  . nitroGLYCERIN (NITROSTAT) 0.4 MG SL tablet Place 0.4 mg under the tongue every 5 (five) minutes as needed for chest pain.    unk   Scheduled:  . aspirin  325 mg Oral Daily  . atorvastatin  40 mg Oral q1800  . doxycycline  100 mg Oral Q12H  . hydrALAZINE  100 mg Oral BID  . ipratropium-albuterol  3 mL Nebulization Q6H  . labetalol  200 mg Oral BID  . mouth rinse  15 mL Mouth Rinse BID  . pantoprazole (PROTONIX) IV  40 mg Intravenous Q24H    Assessment: 72 yo male s/p cardiac arrest with VDRF. He is on apixaban PTA for afib (last dose was 12/31 at 9:30pm as inpatient). -aPTT= 65 after decrease to 900 units/hr  Goal of Therapy:  Heparin level 0.3-0.7 units/ml aPTT 66-102 seconds Monitor platelets by anticoagulation protocol: Yes   Plan:  -Increase heparin to 950 units/hr -daily aptt, heparin level and CBC -Will follow plans for length of therapy  Hildred Laser, Pharm D 12/27/2017 3:34 PM

## 2017-12-27 NOTE — Plan of Care (Signed)
  Elimination: Will not experience complications related to bowel motility 12/27/2017 0539 - Not Met (add Reason) by Angelica Pou, Trilby Drummer, RN Will not experience complications related to urinary retention 12/27/2017 0539 - Not Met (add Reason) by Tish Frederickson, RN   Pain Managment: General experience of comfort will improve 12/27/2017 0539 - Progressing by Tish Frederickson, RN

## 2017-12-27 NOTE — Progress Notes (Signed)
Pt voided 200cc. Post void residual 528cc. Brandi NP made aware will continue to monitor. Pt encouraged to void again. Will continue to monitor.

## 2017-12-27 NOTE — Progress Notes (Signed)
Subjective:  Patient known to me.  He has a history of an ischemic cardiomyopathy and has had previous stenting as well as bypass grafting and also previous carotid artery stenting.  He has been followed in my office.  He also has a [employer] pacemaker that previously had been functioning normally.  He was admitted with a week of pneumonia and then had treatment with ceftriaxone and doxycycline.  He has a history of paroxysmal atrial fibrillation and is on long-term anticoagulation.  He developed torsade and had a cardiac arrest with brief resuscitation and is currently in the process of being extubated.  He had not had any chest pain and has minimal elevation of troponin.  Objective:  Vital Signs in the last 24 hours: BP (!) 168/80   Pulse 72   Temp 97.8 F (36.6 C) (Oral)   Resp 17   Ht 5\' 7"  (1.702 m)   Wt 76.6 kg (168 lb 14 oz)   SpO2 100%   BMI 26.45 kg/m   Physical Exam: Elderly male just now extubated able to talk some Lungs:  Clear Cardiac:  Regular rhythm, normal S1 and S2, no S3, 2/6 systolic murmur Abdomen:  Soft, nontender, no masses Extremities:  No edema present  Intake/Output from previous day: 01/01 0701 - 01/02 0700 In: 2552.4 [I.V.:1942.4; NG/GT:60; IV Piggyback:550] Out: 150 [Emesis/NG output:150]  Weight Filed Weights   12/26/17 0542 12/26/17 1028 12/27/17 0400  Weight: 74.5 kg (164 lb 4.8 oz) 75.9 kg (167 lb 5.3 oz) 76.6 kg (168 lb 14 oz)    Lab Results: Basic Metabolic Panel: Recent Labs    12/26/17 0845 12/27/17 0407  NA 135 134*  K 4.8 5.0  CL 98* 104  CO2 19* 15*  GLUCOSE 159* 101*  BUN 25* 33*  CREATININE 1.99* 1.94*   CBC: Recent Labs    12/25/17 1113 12/26/17 0421 12/27/17 0407  WBC 15.7* 6.7 8.9  NEUTROABS 13.8*  --   --   HGB 10.3* 9.4* 9.8*  HCT 31.1* 28.1* 28.6*  MCV 95.7 97.6 99.3  PLT 334 307 265   Cardiac Enzymes: Troponin (Point of Care Test) Recent Labs    12/25/17 1159  TROPIPOC 0.03   Cardiac Panel (last 3  results) Recent Labs    12/26/17 1415 12/26/17 2049 12/27/17 0407  TROPONINI 2.09* 6.24* 4.09*    Telemetry: Currently in sinus rhythm  Assessment/Plan:  1.  In-hospital cardiac arrest occurring in the setting of pneumonia-would wonder whether this was ischemic induced or secondary to the other illnesses 2.  History of ischemic cardiomyopathy 3.  Functioning pacemaker 4.  Mild dementia but he functions at a high level at home 5.  Chronic kidney disease with acute worsening 6.  Elevation of troponin likely due to recent cardiac arrest  Recommendations:  Interrogate pacemaker.  Recheck magnesium and potassium.  Check echocardiogram today.  Continue to watch renal function and let recover from pneumonia.  If we are going to be aggressive here which I suspect the family will wish he likely will need to have assessment of his grafts once his renal function is stablized and will need to talk about whether or not to have a defibrillator at some point.  Continue amiodarone for the time being.     Kerry Hough  MD [facility]  12/27/2017, 9:06 AM

## 2017-12-27 NOTE — Consult Note (Signed)
ELECTROPHYSIOLOGY CONSULT NOTE    Patient ID: Joe Wiggins MRN: [id number], DOB/AGE: January 13, 1946 72 y.o.  Admit date: 12/25/2017 Date of Consult: 12/27/2017  Primary Physician: Leota Jacobsen, MD Primary Cardiologist: Wynonia Lawman Electrophysiologist: Joe Tanney (new this admission)  Patient Profile: Joe Wiggins is a 72 y.o. male with a history of ICM, CAD s/p CABG, paroxysmal atrial fibrillation, CKD, COPD, HTN, OSA, dementia and prior TIA who is being seen today for the evaluation of VF arrest at the request of Dr Wynonia Lawman.  HPI:  Joe Wiggins is a 72 y.o. male with a past medical history as outlined above.  He was admitted 12/25/17 with pneumonia and treated with Doxycycline and Rocephin.  He developed chest pain yesterday morning and then went into VF.  He was resuscitated. He was intubated and then extubated this morning.  Prior to yesterday morning, he has not had recent chest pain or shortness of breath.  He has been doing relatively well at home. He does not have awareness of his atrial arrhythmias. He does not drive. He currently is very sore from chest compressions. He denies shortness of breath. Cough is improved. No fevers or chills in last 24 hours. He has not had dizziness, pre-syncope, or syncope prior to admission.   Echo today demonstrated EF 40-45%, basal and mid inferolateral and anterolateral hypokinesis, PA pressure 53, LA 34. He has not had recent ischemic eval that he can remember.   Past Medical History:  Diagnosis Date  . AF (paroxysmal atrial fibrillation) ([place])   . CAD (coronary artery disease)   . Cardiomyopathy, ischemic   . Carotid artery occlusion   . Chronic kidney disease   . COPD (chronic obstructive pulmonary disease) ([place])   . Dementia without behavioral disturbance   . Hyperlipidemia   . Hypertension   . Sleep apnea   . Stroke ([place])   . TIA (transient ischemic attack)      Surgical History:  Past Surgical History:  Procedure Laterality Date  . CARDIAC  PACEMAKER PLACEMENT    . CATARACT EXTRACTION    . CORONARY ARTERY BYPASS GRAFT    . CORONARY STENT PLACEMENT    . EYE SURGERY       Medications Prior to Admission  Medication Sig Dispense Refill Last Dose  . albuterol (ACCUNEB) 0.63 MG/3ML nebulizer solution Take 1 ampule by nebulization every 6 (six) hours as needed for Wheezing.   Past Month at Unknown time  . albuterol (PROVENTIL HFA;VENTOLIN HFA) 108 (90 Base) MCG/ACT inhaler Inhale into the lungs.   Past Month at Unknown time  . apixaban (ELIQUIS) 5 MG TABS tablet Take 5 mg by mouth 2 (two) times daily.   12/25/2017 at 7a  . atorvastatin (LIPITOR) 40 MG tablet Take 40 mg by mouth daily.   12/24/2017 at Unknown time  . Cholecalciferol (VITAMIN D3) 3000 UNITS TABS Take 1,000 Units by mouth daily.   12/25/2017 at Unknown time  . Cyanocobalamin (VITAMIN B 12 PO) Take by mouth daily.   12/25/2017 at Unknown time  . donepezil (ARICEPT) 23 MG TABS tablet Take 23 mg by mouth daily.   12/24/2017 at Unknown time  . famotidine (PEPCID) 20 MG tablet Take 20 mg by mouth daily.   12/24/2017 at Unknown time  . Fluticasone-Salmeterol (ADVAIR) 250-50 MCG/DOSE AEPB Inhale 1 puff into the lungs 2 (two) times daily.   12/25/2017 at Unknown time  . folic acid (FOLVITE) 009 MCG tablet Take 400 mcg by mouth daily.   12/24/2017 at Unknown time  .  hydrALAZINE (APRESOLINE) 100 MG tablet Take 100 mg by mouth 2 (two) times daily.    12/24/2017 at Unknown time  . IRON PO Take 65 mg by mouth.   12/24/2017 at Unknown time  . labetalol (NORMODYNE) 200 MG tablet Take 200 mg by mouth 2 (two) times daily.   12/25/2017 at 7a  . levalbuterol (XOPENEX) 1.25 MG/3ML nebulizer solution Take 1.25 mg by nebulization as needed for wheezing.   Past Month at Unknown time  . Multiple Vitamin (MULTIVITAMIN) tablet Take 1 tablet by mouth daily.   12/25/2017 at Unknown time  . tiotropium (SPIRIVA HANDIHALER) 18 MCG inhalation capsule Place 18 mcg into inhaler and inhale daily.    12/24/2017 at Unknown time  . torsemide (DEMADEX) 20 MG tablet Take 20 mg by mouth daily.   12/25/2017 at Unknown time  . memantine (NAMENDA XR) 7 MG CP24 24 hr capsule Take 1 capsule (7 mg total) by mouth daily. (Patient not taking: Reported on 12/25/2017) 90 capsule 1 Not Taking at Unknown time  . nitroGLYCERIN (NITROSTAT) 0.4 MG SL tablet Place 0.4 mg under the tongue every 5 (five) minutes as needed for chest pain.   unk    Inpatient Medications:  . aspirin  325 mg Oral Daily  . atorvastatin  40 mg Oral q1800  . doxycycline  100 mg Oral Q12H  . hydrALAZINE  100 mg Oral BID  . ipratropium-albuterol  3 mL Nebulization Q6H  . labetalol  200 mg Oral BID  . mouth rinse  15 mL Mouth Rinse BID  . pantoprazole (PROTONIX) IV  40 mg Intravenous Q24H    Allergies:  Allergies  Allergen Reactions  . Ipratropium Other (See Comments)  . Morphine And Related Nausea Only  . Vicodin [Hydrocodone-Acetaminophen] Other (See Comments)    Hallucinations   . Penicillins Rash    Social History   Socioeconomic History  . Marital status: Married    Spouse name: Not on file  . Number of children: Not on file  . Years of education: Not on file  . Highest education level: Not on file  Social Needs  . Financial resource strain: Not on file  . Food insecurity - worry: Not on file  . Food insecurity - inability: Not on file  . Transportation needs - medical: Not on file  . Transportation needs - non-medical: Not on file  Occupational History  . Not on file  Tobacco Use  . Smoking status: Former Smoker    Last attempt to quit: 11/30/2012    Years since quitting: 5.0  . Smokeless tobacco: Never Used  Substance and Sexual Activity  . Alcohol use: No  . Drug use: No  . Sexual activity: Not on file  Other Topics Concern  . Not on file  Social History Narrative  . Not on file     Family History  Problem Relation Age of Onset  . Hypertension Mother   . Heart disease Mother   . Hypertension  Sister   . Heart disease Sister      Review of Systems: All other systems reviewed and are otherwise negative except as noted above.  Physical Exam: Vitals:   12/27/17 1230 12/27/17 1300 12/27/17 1304 12/27/17 1330  BP: (!) 172/78 (!) 154/70  (!) 144/69  Pulse: 81 74  73  Resp: 19 (!) 21  (!) 21  Temp:      TempSrc:      SpO2: 97% 98% 98% 98%  Weight:  Height:        GEN- The patient is elderly appearing, alert and oriented x 3 today.   HEENT: normocephalic, atraumatic; sclera clear, conjunctiva pink; hearing intact; oropharynx clear; neck supple Lungs- Clear to ausculation bilaterally, normal work of breathing.  No wheezes, rales, rhonchi Heart- Regular rate and rhythm  GI- soft, non-tender, non-distended, bowel sounds present Extremities- no clubbing, cyanosis, or edema  MS- no significant deformity or atrophy Skin- warm and dry, no rash or lesion Psych- euthymic mood, full affect Neuro- strength and sensation are intact  Labs:   Lab Results  Component Value Date   WBC 8.9 12/27/2017   HGB 9.8 (L) 12/27/2017   HCT 28.6 (L) 12/27/2017   MCV 99.3 12/27/2017   PLT 265 12/27/2017    Recent Labs  Lab 12/25/17 1113  12/27/17 0407  NA 132*   < > 134*  K 4.8   < > 5.0  CL 98*   < > 104  CO2 25   < > 15*  BUN 24*   < > 33*  CREATININE 1.96*   < > 1.94*  CALCIUM 9.9   < > 8.9  PROT 7.1  --   --   BILITOT 0.8  --   --   ALKPHOS 120  --   --   ALT 16*  --   --   AST 24  --   --   GLUCOSE 121*   < > 101*   < > = values in this interval not displayed.      Radiology/Studies: Dg Chest 2 View  Result Date: 12/25/2017 CLINICAL DATA:  Shortness of breath for the past 2 days. Productive cough. Ex-smoker. EXAM: CHEST  2 VIEW COMPARISON:  01/17/2016. FINDINGS: Borderline enlarged cardiac silhouette. Stable post CABG changes. Stable left subclavian bipolar pacemaker leads. No significant change in probable pleural and parenchymal scarring at the left lung base,  laterally. Interval patchy opacity in the right upper lobe. Diffuse peribronchial thickening and accentuation of the interstitial markings with mild hyperexpansion of the lungs.a there is also an interval small irregular nodular density at the right lateral lung base. Thoracic spine degenerative changes. IMPRESSION: 1. Right upper lobe pneumonia. 2. Interval small may irregular nodular density at the right lung base, laterally. Attention to this area on follow-up chest radiographs is recommended. 3. Mild changes of COPD and chronic bronchitis with stable left basilar pleural and parenchymal scarring. Electronically Signed   By: Claudie Revering M.D.   On: 12/25/2017 11:45   Dg Chest Port 1 View  Result Date: 12/26/2017 CLINICAL DATA:  Cardiac arrest, intubated EXAM: PORTABLE CHEST 1 VIEW COMPARISON:  Chest radiograph from one day prior. FINDINGS: Endotracheal tube tip is 4.7 cm above the carina. Pacer pads overlie the left chest. Intact sternotomy wires. CABG clips overlie the mediastinum. Stable cardiomediastinal silhouette with normal heart size. No pneumothorax. No right pleural effusion. Stable small left pleural effusion. Worsening diffuse patchy parahilar lung opacities. IMPRESSION: 1. Well-positioned endotracheal tube. 2. Worsening diffuse patchy parahilar lung opacities, probably pulmonary edema, with a component of aspiration/pneumonia not excluded. 3. Stable small left pleural effusion. Electronically Signed   By: Ilona Sorrel M.D.   On: 12/26/2017 08:59   Dg Abd Portable 1v  Result Date: 12/26/2017 CLINICAL DATA:  Orogastric tube placement EXAM: PORTABLE ABDOMEN - 1 VIEW COMPARISON:  None. FINDINGS: Gastric tube enters the stomach with the tip near the duodenal bulb. Nonobstructive bowel gas pattern. Bilateral airspace disease and small left effusion  IMPRESSION: Gastric tube in the region of the duodenal bulb. Nonobstructive bowel gas pattern. Electronically Signed   By: Franchot Gallo M.D.   On:  12/26/2017 11:20    JOI:NOMVE rhythm (personally reviewed)  TELEMETRY: sinus rhythm ->2:1 flutter -> SR -> VF (personally reviewed)  DEVICE HISTORY: STJ dual chamber PPM implanted by Dr Phillis Haggis  Assessment/Plan: 1.  VF arrest With chest pain prior to event and elevated troponin, cardiac cath would be indicated. Dr Rayann Heman to discuss timing with Dr Wynonia Lawman If no reversible cause for VF identified, consider upgrade to ICD Keep K>3.9, Mg >1.8 The patient does not drive Continue amiodarone for now, could transition to po tomorrow   2.  Paroxysmal atrial arrhythmias Asymptomatic Continue Heparin for now, transition to home Eliquis after ischemic eval  3.  CAD s/p CABG Cath as above once renal function stabilizes He has not had chest pain aside from the episode just prior to VF arrest  Dr Rayann Heman to see later today   Signed, Chanetta Marshall, NP 12/27/2017 2:58 PM   I have seen, examined the patient, and reviewed the above assessment and plan.  Changes to above are made where necessary.  On exam, RRR.  Chest wall is very tender.  He is s/p successful resuscitation from VF arrest.  He reports severe chest pain prior to his arrest.  Significantly elevated troponin.  I agree with Dr Wynonia Lawman that we should proceed with cath once renal function allows.  Would defer decisions regarding device upgrade until after his cath.  Ep to follow along.  Co Sign: Thompson Grayer, MD 12/27/2017 10:04 PM

## 2017-12-27 NOTE — Plan of Care (Signed)
  Respiratory: Ability to maintain a clear airway and adequate ventilation will improve 12/27/2017 0830 - Progressing by Jenne Campus, RN  Weaning on vent this am Skin Integrity: Risk for impaired skin integrity will decrease 12/27/2017 0830 - Progressing by Jenne Campus, RN   Safety: Ability to remain free from injury will improve 12/27/2017 0830 - Progressing by Jenne Campus, RN

## 2017-12-27 NOTE — Procedures (Signed)
Extubation Procedure Note  Patient Details:   Name: Joe Wiggins DOB: 30-Aug-1946 MRN: [id number]   Airway Documentation:     Evaluation  O2 sats: stable throughout Complications: No apparent complications Patient did tolerate procedure well. Bilateral Breath Sounds: Clear, Diminished   Yes   Positive cuff leak noted. Pt placed on [place] 4 L with humidity, tolerating well at this time.  No stridor noted.  Pt able to reach 1000 mL using incentive spirometer.  Bayard Beaver 12/27/2017, 9:39 AM

## 2017-12-27 NOTE — Progress Notes (Signed)
Pt was admitted yesterday on holiday coverage by [facility]. He is followed by Dr. Wynonia Lawman who will see him today.   Lauree Chandler 12/27/2017 8:08 AM

## 2017-12-27 NOTE — Progress Notes (Signed)
  Echocardiogram 2D Echocardiogram has been performed.  Joe Wiggins 12/27/2017, 11:28 AM

## 2017-12-28 ENCOUNTER — Inpatient Hospital Stay (HOSPITAL_COMMUNITY): Payer: PPO

## 2017-12-28 LAB — CBC
HCT: 26.3 % — ABNORMAL LOW (ref 39.0–52.0)
Hemoglobin: 9.1 g/dL — ABNORMAL LOW (ref 13.0–17.0)
MCH: 33.8 pg (ref 26.0–34.0)
MCHC: 34.6 g/dL (ref 30.0–36.0)
MCV: 97.8 fL (ref 78.0–100.0)
Platelets: 305 10*3/uL (ref 150–400)
RBC: 2.69 MIL/uL — ABNORMAL LOW (ref 4.22–5.81)
RDW: 16.1 % — ABNORMAL HIGH (ref 11.5–15.5)
WBC: 6.4 10*3/uL (ref 4.0–10.5)

## 2017-12-28 LAB — BASIC METABOLIC PANEL
Anion gap: 9 (ref 5–15)
BUN: 29 mg/dL — ABNORMAL HIGH (ref 6–20)
CO2: 22 mmol/L (ref 22–32)
Calcium: 9.1 mg/dL (ref 8.9–10.3)
Chloride: 103 mmol/L (ref 101–111)
Creatinine, Ser: 1.84 mg/dL — ABNORMAL HIGH (ref 0.61–1.24)
GFR calc Af Amer: 41 mL/min — ABNORMAL LOW (ref >60)
GFR calc non Af Amer: 35 mL/min — ABNORMAL LOW (ref >60)
Glucose, Bld: 108 mg/dL — ABNORMAL HIGH (ref 65–99)
Potassium: 3.9 mmol/L (ref 3.5–5.1)
Sodium: 134 mmol/L — ABNORMAL LOW (ref 135–145)

## 2017-12-28 LAB — GLUCOSE, CAPILLARY
Glucose-Capillary: 120 mg/dL — ABNORMAL HIGH (ref 65–99)
Glucose-Capillary: 117 mg/dL — ABNORMAL HIGH (ref 65–99)
Glucose-Capillary: 111 mg/dL — ABNORMAL HIGH (ref 65–99)
Glucose-Capillary: 127 mg/dL — ABNORMAL HIGH (ref 65–99)

## 2017-12-28 LAB — CULTURE, RESPIRATORY W GRAM STAIN
Culture: NO GROWTH
Special Requests: NORMAL

## 2017-12-28 LAB — BLOOD GAS, ARTERIAL
Acid-base deficit: 4.3 mmol/L — ABNORMAL HIGH (ref 0.0–2.0)
Acid-base deficit: 4.3 mmol/L — ABNORMAL HIGH (ref 0.0–2.0)
Bicarbonate: 21.8 mmol/L (ref 20.0–28.0)
Bicarbonate: 20.7 mmol/L (ref 20.0–28.0)
Delivery systems: POSITIVE
Drawn by: 28433
Drawn by: 252031
Expiratory PAP: 5
FIO2: 1
FIO2: 60
Inspiratory PAP: 15
O2 Saturation: 96 %
O2 Saturation: 98.4 %
Patient temperature: 98.6
Patient temperature: 98.6
RATE: 10 resp/min
pCO2 arterial: 51.8 mmHg — ABNORMAL HIGH (ref 32.0–48.0)
pCO2 arterial: 40.3 mmHg (ref 32.0–48.0)
pH, Arterial: 7.247 — ABNORMAL LOW (ref 7.350–7.450)
pH, Arterial: 7.33 — ABNORMAL LOW (ref 7.350–7.450)
pO2, Arterial: 103 mmHg (ref 83.0–108.0)
pO2, Arterial: 142 mmHg — ABNORMAL HIGH (ref 83.0–108.0)

## 2017-12-28 LAB — HEPARIN LEVEL (UNFRACTIONATED): Heparin Unfractionated: 1.46 IU/mL — ABNORMAL HIGH (ref 0.30–0.70)

## 2017-12-28 LAB — TROPONIN I: Troponin I: 1.99 ng/mL (ref <0.03)

## 2017-12-28 LAB — MAGNESIUM: Magnesium: 2.6 mg/dL — ABNORMAL HIGH (ref 1.7–2.4)

## 2017-12-28 LAB — APTT: aPTT: 73 seconds — ABNORMAL HIGH (ref 24–36)

## 2017-12-28 MED ORDER — AMIODARONE HCL IN DEXTROSE 360-4.14 MG/200ML-% IV SOLN
INTRAVENOUS | Status: AC
  Administered 2017-12-28: 30 mg/h via INTRAVENOUS
  Filled 2017-12-28: qty 200

## 2017-12-28 MED ORDER — ASPIRIN 81 MG PO CHEW
81.0000 mg | CHEWABLE_TABLET | ORAL | Status: AC
  Administered 2017-12-29: 81 mg via ORAL
  Filled 2017-12-28: qty 1

## 2017-12-28 MED ORDER — METOPROLOL SUCCINATE ER 25 MG PO TB24
12.5000 mg | ORAL_TABLET | Freq: Two times a day (BID) | ORAL | Status: DC
  Administered 2017-12-28 – 2017-12-30 (×5): 12.5 mg via ORAL
  Filled 2017-12-28 (×5): qty 1

## 2017-12-28 MED ORDER — SODIUM CHLORIDE 0.9% FLUSH
3.0000 mL | Freq: Two times a day (BID) | INTRAVENOUS | Status: DC
  Administered 2017-12-28 – 2017-12-29 (×2): 3 mL via INTRAVENOUS

## 2017-12-28 MED ORDER — NITROGLYCERIN IN D5W 200-5 MCG/ML-% IV SOLN
0.0000 ug/min | INTRAVENOUS | Status: DC
Start: 2017-12-28 — End: 2017-12-28

## 2017-12-28 MED ORDER — LORAZEPAM 2 MG/ML IJ SOLN
0.5000 mg | Freq: Once | INTRAMUSCULAR | Status: AC
  Administered 2017-12-28: 0.5 mg via INTRAVENOUS

## 2017-12-28 MED ORDER — HYDROCODONE-ACETAMINOPHEN 5-325 MG PO TABS
1.0000 | ORAL_TABLET | Freq: Four times a day (QID) | ORAL | Status: DC | PRN
Start: 2017-12-28 — End: 2018-01-02
  Administered 2017-12-28 – 2018-01-02 (×10): 1 via ORAL
  Filled 2017-12-28 (×11): qty 1

## 2017-12-28 MED ORDER — AMIODARONE HCL IN DEXTROSE 360-4.14 MG/200ML-% IV SOLN
30.0000 mg/h | INTRAVENOUS | Status: DC
  Administered 2017-12-28 – 2017-12-30 (×5): 30 mg/h via INTRAVENOUS
  Filled 2017-12-28 (×5): qty 200

## 2017-12-28 MED ORDER — SODIUM CHLORIDE 0.9 % WEIGHT BASED INFUSION
3.0000 mL/kg/h | INTRAVENOUS | Status: DC
  Administered 2017-12-29: 3 mL/kg/h via INTRAVENOUS

## 2017-12-28 MED ORDER — FUROSEMIDE 10 MG/ML IJ SOLN
60.0000 mg | Freq: Once | INTRAMUSCULAR | Status: AC
  Administered 2017-12-28: 60 mg via INTRAVENOUS

## 2017-12-28 MED ORDER — SODIUM CHLORIDE 0.9 % WEIGHT BASED INFUSION: 1.0000 mL/kg/h | INTRAVENOUS | Status: DC

## 2017-12-28 MED ORDER — SODIUM CHLORIDE 0.9 % IV SOLN: 250.0000 mL | INTRAVENOUS | Status: DC | PRN

## 2017-12-28 MED ORDER — HYDROCODONE-ACETAMINOPHEN 5-325 MG PO TABS: 1.0000 | ORAL_TABLET | Freq: Four times a day (QID) | ORAL | Status: DC | PRN

## 2017-12-28 MED ORDER — LORAZEPAM 2 MG/ML IJ SOLN
INTRAMUSCULAR | Status: AC
  Filled 2017-12-28: qty 1

## 2017-12-28 MED ORDER — SODIUM CHLORIDE 0.9% FLUSH: 3.0000 mL | INTRAVENOUS | Status: DC | PRN

## 2017-12-28 MED ORDER — FUROSEMIDE 10 MG/ML IJ SOLN
INTRAMUSCULAR | Status: AC
  Filled 2017-12-28: qty 6

## 2017-12-28 MED ORDER — AMIODARONE LOAD VIA INFUSION
150.0000 mg | Freq: Once | INTRAVENOUS | Status: AC
  Administered 2017-12-28: 150 mg via INTRAVENOUS

## 2017-12-28 MED ORDER — SODIUM CHLORIDE 0.9 % IV SOLN: 500.0000 mg | Freq: Two times a day (BID) | INTRAVENOUS | Status: DC

## 2017-12-28 MED ORDER — NITROGLYCERIN IN D5W 200-5 MCG/ML-% IV SOLN
INTRAVENOUS | Status: AC
Start: 2017-12-28 — End: 2017-12-29
  Filled 2017-12-28: qty 250

## 2017-12-28 MED ORDER — LEVALBUTEROL HCL 0.63 MG/3ML IN NEBU
0.6300 mg | INHALATION_SOLUTION | Freq: Four times a day (QID) | RESPIRATORY_TRACT | Status: DC | PRN
  Administered 2017-12-28: 0.63 mg via RESPIRATORY_TRACT
  Filled 2017-12-28: qty 3

## 2017-12-28 MED ORDER — NITROGLYCERIN IN D5W 200-5 MCG/ML-% IV SOLN
0.0000 ug/min | INTRAVENOUS | Status: DC
  Administered 2017-12-28: 10 ug/min via INTRAVENOUS

## 2017-12-28 MED ORDER — GUAIFENESIN 100 MG/5ML PO SOLN
30.0000 mL | Freq: Two times a day (BID) | ORAL | Status: DC
  Administered 2017-12-28 – 2017-12-30 (×4): 600 mg via ORAL
  Filled 2017-12-28 (×6): qty 30

## 2017-12-28 MED ORDER — AMIODARONE HCL 200 MG PO TABS
200.0000 mg | ORAL_TABLET | Freq: Two times a day (BID) | ORAL | Status: DC
  Administered 2017-12-28: 200 mg via ORAL
  Filled 2017-12-28: qty 1

## 2017-12-28 NOTE — Progress Notes (Signed)
PULMONARY / CRITICAL CARE MEDICINE   Name: Joe Wiggins MRN: [id number] DOB: [birth year]    ADMISSION DATE:  12/25/2017 CONSULTATION DATE:  12/26/2017  REFERRING MD:  Yisroel Ramming  CHIEF COMPLAINT:  S/P Cardiopulmonary arrest.  HISTORY OF PRESENT ILLNESS:   This is a 72 year old with a history of paroxysmal atrial fibrillation, history of CABG, history of COPD, and history of dementia, who was admitted with dyspnea and a productive purulent cough.  There was a right-sided infiltrate on chest x-ray as well as a suggestion of a right lower lobe nodule.  He has been treated appropriately with a combination of Rocephin and doxycycline for community-acquired pneumonia.  Urine strep pneumonia antigen is positive.  This morning he is suffered from a torsades arrest.  I have no history of complaints prior to the arrest.  Resuscitation was rapid, he received 2 rounds of epinephrine and was shocked into atrial fibrillation with rapid ventricular response.  The patient was purposeful following resuscitation and received 100 mcg of fentanyl grams of Versed for sedation prior to being examined by me.  SUBJECTIVE:  Extubated yesterday, has done fairly well but difficulty with managing thick secretions in part due to his obstructive lung disease and also chest pain following his CPR. He had a run of SVT, possibly VT (wide-complex) that spontaneously resolved this morning.  He is back on IV amiodarone after a bolus.  VITAL SIGNS: BP (!) 192/79   Pulse 82   Temp 98 F (36.7 C) (Axillary)   Resp (!) 25   Ht 5\' 7"  (1.702 m)   Wt 76.6 kg (168 lb 14 oz)   SpO2 95%   BMI 26.45 kg/m   HEMODYNAMICS:   VENTILATOR SETTINGS:    INTAKE / OUTPUT: I/O last 3 completed shifts: In: 2917 [I.V.:2867; IV Piggyback:50] Out: 1200 [Urine:1200]  PHYSICAL EXAMINATION: General: Elderly man, somewhat uncomfortable, coughing with difficulty getting up his secretions Neuro: Awake, alert, appropriate, follows  commands Cardiovascular: Regular, tachycardic 110s,  Lungs: Coarse bilaterally, some coarse expiratory wheezes and rhonchi MSK: Chest wall remains very tender bilaterally following CPR Abdomen: Soft, nontender, + BS EXt: no edema   LABS:  BMET Recent Labs  Lab 12/27/17 0407 12/27/17 1859 12/28/17 0436  NA 134* 134* 134*  K 5.0 4.2 3.9  CL 104 101 103  CO2 15* 21* 22  BUN 33* 30* 29*  CREATININE 1.94* 1.94* 1.84*  GLUCOSE 101* 113* 108*    Electrolytes Recent Labs  Lab 12/26/17 0845 12/27/17 0407 12/27/17 1344 12/27/17 1859 12/28/17 0436  CALCIUM 9.6 8.9  --  9.1 9.1  MG 3.7*  --  2.8*  --  2.6*    CBC Recent Labs  Lab 12/26/17 0421 12/27/17 0407 12/28/17 0436  WBC 6.7 8.9 6.4  HGB 9.4* 9.8* 9.1*  HCT 28.1* 28.6* 26.3*  PLT 307 265 305    Coag's Recent Labs  Lab 12/27/17 0407 12/27/17 1344 12/28/17 0436  APTT 108* 65* 73*    Sepsis Markers Recent Labs  Lab 12/25/17 1201 12/26/17 0845  LATICACIDVEN 1.33  --   PROCALCITON  --  0.16    ABG Recent Labs  Lab 12/26/17 0855  PHART 7.300*  PCO2ART 46.9  PO2ART 239.0*    Liver Enzymes Recent Labs  Lab 12/25/17 1113  AST 24  ALT 16*  ALKPHOS 120  BILITOT 0.8  ALBUMIN 3.7    Cardiac Enzymes Recent Labs  Lab 12/26/17 1415 12/26/17 2049 12/27/17 0407  TROPONINI 2.09* 6.24* 4.09*    Glucose Recent  Labs  Lab 12/27/17 0350 12/27/17 0726 12/27/17 1145 12/27/17 1504 12/27/17 2122 12/28/17 0806  GLUCAP 98 101* 99 107* 103* 120*    Imaging Dg Chest Port 1 View  Result Date: 12/28/2017 CLINICAL DATA:  Acute respiratory failure, shortness of breath. History of COPD, hospital and community acquired pneumonia, cardiac arrest EXAM: PORTABLE CHEST 1 VIEW COMPARISON:  Chest x-ray of December 26, 2017 FINDINGS: There has been interval extubation of the trachea. The lungs are well-expanded. The interstitial markings remain increased. Confluent density at the left lung base persists. Areas  of near confluent density in the right mid and lower lung persist as well. The heart is top-normal in size. The pulmonary vascularity is not clearly engorged. The sternal wires are intact. The ICD is in stable position. There is calcification in the wall of the aortic arch. IMPRESSION: Slightly decreased pulmonary vascular congestion. Persistent but slightly improved interstitial edema bilaterally. Left basilar atelectasis or pneumonia. Electronically Signed   By: David  Martinique M.D.   On: 12/28/2017 07:07     STUDIES:   DISCUSSION: This is a 72 year old with COPD who presented with shortness of breath and cough productive of purulent sputum.  Chest x-ray suggested right-sided infiltrate and he was empirically treated for community-acquired pneumonia.  He suffered from a torsades arrest 1/1. He required a very brief resuscitation with 2 rounds of epinephrine given.  He was shocked into atrial fibrillation which was a perfusing rhythm.  He was purposeful following resuscitation. Extubated 1/2.  Has had difficulty clearing secretions, dyspnea, continued chest discomfort from his CPR  ASSESSMENT / PLAN:  PULMONARY A: Acute respiratory failure Probable right lower lobe community acquired pneumonia, pneumococcal antigen positive Community acquired pneumonia COPD Need to push pulmonary hygiene, add flutter valve when his CP improved Add Xopenex nebulizers as needed to assist with secretion clearance And guaifenesin Increase pain control to assist with his cough mechanics Continue abx > stop doxycycline 1/3, continue ceftriaxone. Plan 7 days (day 4)  CARDIOVASCULAR A:  Polymorphic VT arrest 1/1 Atrial fibrillation, RVR Coronary disease history of CABG Non-ST elevation MI Hypothermia deferred given short duration resuscitation, intact neurological function Amiodarone has been converted to p.o. morning of 1/3 but then he had recurrent dysrhythmia.  Repeat amiodarone bolus given and infusion  restarted. Continues on heparin drip Metoprolol p.o. started on 1/3 Continue telemetry monitoring and ICU monitoring given his recent rhythm disturbance  RENAL A: Acute on chronic oliguric renal failure, likely ATN, appears to be plateauing Hyperkalemia, resolved Follow BMP and urine output Avoid nephrotoxins Ensure adequate renal perfusion   GASTROINTESTINAL A: Stress ulcer prophylaxis Pantoprazole ordered Clear diet initiated, advance when he can tolerate  NEUROLOGIC A:  Sedation for intubation, ventilation.  Discontinued Chest pain post CPR Add Vicodin low dose to his existing Ultram to help with chest pain and cough mechanics Avoid other sedating medications as able  Independent CC time 33 minutes  Baltazar Apo, MD, PhD 12/28/2017, 10:03 AM [facility] [phone] or if no answer [phone]

## 2017-12-28 NOTE — Progress Notes (Signed)
Subjective:  Currently complains of a lot of chest wall soreness from the CPR.  Becomes anxious.  No ischemic type chest pain.  Still recovering from pneumonia and his white count is down.  His creatinine today is 1.84 and reviewing old records was 1.86 on December 5 so I suspect he is back to where he needs to be.  Objective:  Vital Signs in the last 24 hours: BP (!) 180/77   Pulse 82   Temp 98 F (36.7 C) (Axillary)   Resp (!) 25   Ht 5\' 7"  (1.702 m)   Wt 76.6 kg (168 lb 14 oz)   SpO2 95%   BMI 26.45 kg/m   Physical Exam: Elderly male complaining of chest pain but in no acute distress Lungs: Reduced breath sounds cardiac:  Regular rhythm, normal S1 and S2, no S3, 2/6 systolic murmur Abdomen:  Soft, nontender, no masses Extremities:  No edema present  Intake/Output from previous day: 01/02 0701 - 01/03 0700 In: 1807 [I.V.:1757; IV Piggyback:50] Out: 1200 [Urine:1200]  Weight Filed Weights   12/26/17 0542 12/26/17 1028 12/27/17 0400  Weight: 74.5 kg (164 lb 4.8 oz) 75.9 kg (167 lb 5.3 oz) 76.6 kg (168 lb 14 oz)    Lab Results: Basic Metabolic Panel: Recent Labs    12/27/17 1859 12/28/17 0436  NA 134* 134*  K 4.2 3.9  CL 101 103  CO2 21* 22  GLUCOSE 113* 108*  BUN 30* 29*  CREATININE 1.94* 1.84*   CBC: Recent Labs    12/25/17 1113  12/27/17 0407 12/28/17 0436  WBC 15.7*   < > 8.9 6.4  NEUTROABS 13.8*  --   --   --   HGB 10.3*   < > 9.8* 9.1*  HCT 31.1*   < > 28.6* 26.3*  MCV 95.7   < > 99.3 97.8  PLT 334   < > 265 305   < > = values in this interval not displayed.   Cardiac Enzymes: Troponin (Point of Care Test) Recent Labs    12/25/17 1159  TROPIPOC 0.03   Cardiac Panel (last 3 results) Recent Labs    12/26/17 1415 12/26/17 2049 12/27/17 0407  TROPONINI 2.09* 6.24* 4.09*    Telemetry: Currently in sinus rhythm.  Occasional PVCs.  Assessment/Plan:  1.  In-hospital cardiac arrest occurring in the setting of pneumonia-would wonder whether  this was ischemic induced or secondary to the other illnesses-he will need an ischemic evaluation with repeat cath and suspect likely will need defibrillator.  Wife would be more comfortable with the defibrillator and he was functioning quite highly at home. 2.  History of ischemic cardiomyopathy 3.  Functioning pacemaker 4.  Mild dementia but he functions at a high level at home 5.  Chronic kidney disease probably back to baseline now 6.  Elevation of troponin likely due to recent cardiac arrest  Recommendations:  We will go ahead and tentatively plan for catheterization tomorrow. Cardiac catheterization was discussed with the patient fully including risks of myocardial infarction, death, stroke, bleeding, arrhythmia, dye allergy, renal insufficiency or bleeding.  The patient understands and is willing to proceed.  Go ahead and change to oral amiodarone at this time.  Use tramadol for chest wall pain.  Repeat EKG today.  Echocardiogram reviewed and shows EF of 40-45% reduced from previous echo.   Kerry Hough  MD [facility]  12/28/2017, 8:48 AM

## 2017-12-28 NOTE — Progress Notes (Signed)
PT Cancellation Note  Patient Details Name: Joe Wiggins MRN: [id number] DOB: [birth year]   Cancelled Treatment:    Reason Eval/Treat Not Completed: Other (comment). Per RN, pt with increased anxiety, with SVT that transitioned to V-tach, and currently on non-rebreather and just recently settled. Requesting PT follow-up for evaluation tomorrow. Will plan for PT evaluation tomorrow.  Mabeline Caras, PT, DPT Acute Rehab Services  Pager: [place] 12/28/2017, 3:59 PM

## 2017-12-28 NOTE — Progress Notes (Signed)
FPTS Social Note  S: Joe Wiggins says he is feeling better but is experiencing significant chest soreness from CPR.  He also has a cough and throat irritation from his breathing tube.  He has some anxiety about coughing and eating because of the pain it causes his chest.  Overall, however, he is in good spirits this morning.  O: BP (!) 180/77   Pulse 82   Temp 97.8 F (36.6 C) (Axillary)   Resp (!) 25   Ht 5\' 7"  (1.702 m)   Wt 168 lb 14 oz (76.6 kg)   SpO2 95%   BMI 26.45 kg/m    A/P: We will continue to follow Mr. Lichtenwalner socially while he is in CCM's care, and we appreciate the excellent care provided by CCM.  We will resume care after patient is transferred out of CCM.  Kathrene Alu, MD 12/28/2017, 8:08 AM PGY-1, [place] [facility] pager [phone]

## 2017-12-28 NOTE — Progress Notes (Addendum)
Pt developed non-sustained v-tach with heart rate in the 120-140. Pt c/o being short of breath. Pt currently on NRB mask. Dr. Wynonia Lawman paged.  Will continue to monitor. Roselyn Reef Orla Jolliff,RN   [phone] - Dr. Thurman Coyer office called - spoke with after-hours receptionist who stated an on-call MD would be calling me back. Will continue to monitor. Roselyn Reef Treon Kehl,RN

## 2017-12-28 NOTE — Progress Notes (Signed)
PCCM Interval Progress Note  Called to bedside to assess pt for increased WOB.  Started on nitro gtt and given lasix earlier in the evening.  Later started on BiPAP.  Still has some mild increased WOB.  ABG with mild hyperapnia (7.25 / 52 / 103).  Wife feels that large component of why pt feels dyspneic is anxiety.  Pt states he is a little anxious but doesn't feel overwhelmed.  Vitals stable.  Lungs clear.  Will give 0.5mg  ativan in attempts of anxiolysis and RN asked to call back if no improvement or pt has continued increased WOB. Pt and wife updated that if he continues to have dyspnea or WOB increases, then next step is intubation.   Joe Wiggins, [facility] Pager: [phone]  or [phone] 12/28/2017, 8:25 PM

## 2017-12-28 NOTE — H&P (View-Only) (Signed)
Called by nursing to evaluate patient due to NSVT. Chart reviewed, history of ICM and CAD with prior CABG, PAF, CKD, demtnia with inpatient VF arrest earlier this admit. Plans are for cardiac cath tomorrow. Has been on amiodarone, IV restarted this AM. Peak troponin 6.24. Echo LVEF 40-45%, grade II diastolic dysfunction down from 50-55%, with basal and midinferolateral and anterolateral hypokinesis  Patient examined increased work of breathing, diaphoretic. Chest soreness worst with deep breaths he attributes to prior CPR, no new chest tightness/pressure this evening. SBP in 180s, bilateral crackles. We will start NG drip for additional afterload, obtain stat ABG, CXR. Give lasix 60mg  IV x 1.  K 3.9, Cr 1.84, Hgb 9.1, Mg 2.6 12 lead EKG SR, incomplete LBBB unchanged  Medical therapy: ASA, atorva 40, hep gtt, toprol   We will see if he will stabilize with initial medical therapy. Very high bp's throughout the day, hopefully pulmonary edema related to his systolic dysfunction and severe HTN, follow with NG drip and IV lasix. If does not improve may have to go for cath tonight as opposed tomorrow.   Carlyle Dolly MD    [phone] addendum ABG 07/19/51/103 on NRB. We will place on bipap. I have asked nursing to notify primary team/critical care about his gas and respiratory status in case he further deteriorates and intubation is required. I discussed with Dr Scarlette Calico who is on for cath tonight to make him aware of patient, and will sign out patient to overnight fellow. Repeat ABG in 1 hr on bipap.    Carlyle Dolly MD

## 2017-12-28 NOTE — Progress Notes (Signed)
[facility] for heparin  Indication: atrial fibrillation  Allergies  Allergen Reactions  . Ipratropium Other (See Comments)  . Morphine And Related Nausea Only  . Vicodin [Hydrocodone-Acetaminophen] Other (See Comments)    Hallucinations   . Penicillins Rash    Patient Measurements: Height: 5\' 7"  (170.2 cm) Weight: 168 lb 14 oz (76.6 kg) IBW/kg (Calculated) : 66.1   Vital Signs: Temp: 98 F (36.7 C) (01/03 0827) Temp Source: Axillary (01/03 0827) BP: 176/87 (01/03 1100) Pulse Rate: 110 (01/03 1100)  Labs: Recent Labs    12/26/17 0421  12/26/17 1415  12/26/17 2049 12/27/17 0407 12/27/17 1344 12/27/17 1859 12/28/17 0436  HGB 9.4*  --   --   --   --  9.8*  --   --  9.1*  HCT 28.1*  --   --   --   --  28.6*  --   --  26.3*  PLT 307  --   --   --   --  265  --   --  305  APTT  --   --   --    < > 96* 108* 65*  --  73*  HEPARINUNFRC  --   --   --   --  >2.20* >2.20*  --   --  1.46*  CREATININE 1.96*   < >  --   --   --  1.94*  --  1.94* 1.84*  TROPONINI  --    < > 2.09*  --  6.24* 4.09*  --   --   --    < > = values in this interval not displayed.    Estimated Creatinine Clearance: 33.9 mL/min (A) (by C-G formula based on SCr of 1.84 mg/dL (H)).   Medical History: Past Medical History:  Diagnosis Date  . AF (paroxysmal atrial fibrillation) ([place])   . CAD (coronary artery disease)   . Cardiomyopathy, ischemic   . Carotid artery occlusion   . Chronic kidney disease   . COPD (chronic obstructive pulmonary disease) ([place])   . Dementia without behavioral disturbance   . Hyperlipidemia   . Hypertension   . Sleep apnea   . Stroke ([place])   . TIA (transient ischemic attack)     Medications:  Medications Prior to Admission  Medication Sig Dispense Refill Last Dose  . albuterol (ACCUNEB) 0.63 MG/3ML nebulizer solution Take 1 ampule by nebulization every 6 (six) hours as needed for Wheezing.   Past Month at Unknown time  .  albuterol (PROVENTIL HFA;VENTOLIN HFA) 108 (90 Base) MCG/ACT inhaler Inhale into the lungs.   Past Month at Unknown time  . apixaban (ELIQUIS) 5 MG TABS tablet Take 5 mg by mouth 2 (two) times daily.   12/25/2017 at 7a  . atorvastatin (LIPITOR) 40 MG tablet Take 40 mg by mouth daily.   12/24/2017 at Unknown time  . Cholecalciferol (VITAMIN D3) 3000 UNITS TABS Take 1,000 Units by mouth daily.   12/25/2017 at Unknown time  . Cyanocobalamin (VITAMIN B 12 PO) Take by mouth daily.   12/25/2017 at Unknown time  . donepezil (ARICEPT) 23 MG TABS tablet Take 23 mg by mouth daily.   12/24/2017 at Unknown time  . famotidine (PEPCID) 20 MG tablet Take 20 mg by mouth daily.   12/24/2017 at Unknown time  . Fluticasone-Salmeterol (ADVAIR) 250-50 MCG/DOSE AEPB Inhale 1 puff into the lungs 2 (two) times daily.   12/25/2017 at Unknown time  . folic acid (FOLVITE)  400 MCG tablet Take 400 mcg by mouth daily.   12/24/2017 at Unknown time  . hydrALAZINE (APRESOLINE) 100 MG tablet Take 100 mg by mouth 2 (two) times daily.    12/24/2017 at Unknown time  . IRON PO Take 65 mg by mouth.   12/24/2017 at Unknown time  . labetalol (NORMODYNE) 200 MG tablet Take 200 mg by mouth 2 (two) times daily.   12/25/2017 at 7a  . levalbuterol (XOPENEX) 1.25 MG/3ML nebulizer solution Take 1.25 mg by nebulization as needed for wheezing.   Past Month at Unknown time  . Multiple Vitamin (MULTIVITAMIN) tablet Take 1 tablet by mouth daily.   12/25/2017 at Unknown time  . tiotropium (SPIRIVA HANDIHALER) 18 MCG inhalation capsule Place 18 mcg into inhaler and inhale daily.   12/24/2017 at Unknown time  . torsemide (DEMADEX) 20 MG tablet Take 20 mg by mouth daily.   12/25/2017 at Unknown time  . memantine (NAMENDA XR) 7 MG CP24 24 hr capsule Take 1 capsule (7 mg total) by mouth daily. (Patient not taking: Reported on 12/25/2017) 90 capsule 1 Not Taking at Unknown time  . nitroGLYCERIN (NITROSTAT) 0.4 MG SL tablet Place 0.4 mg under the tongue every  5 (five) minutes as needed for chest pain.   unk   Scheduled:  . aspirin  325 mg Oral Daily  . atorvastatin  40 mg Oral q1800  . guaiFENesin  30 mL Oral BID  . hydrALAZINE  100 mg Oral BID  . ipratropium-albuterol  3 mL Nebulization Q6H  . mouth rinse  15 mL Mouth Rinse BID  . metoprolol succinate  12.5 mg Oral BID WC  . pantoprazole (PROTONIX) IV  40 mg Intravenous Q24H    Assessment: 72 yo male s/p cardiac arrest with VDRF. He is on apixaban PTA for afib (last dose was 12/31 at 9:30pm as inpatient). Plans noted for cath on 1/4. -aPTT= 73 on 950 units/hr  Goal of Therapy:  Heparin level 0.3-0.7 units/ml aPTT 66-102 seconds Monitor platelets by anticoagulation protocol: Yes   Plan:  -No heparin changes needed -daily aptt, heparin level and CBC  Hildred Laser, Pharm D 12/28/2017 11:12 AM

## 2017-12-28 NOTE — Progress Notes (Signed)
Plans for cath tomorrow noted. Discussed with Dr Wynonia Lawman today.  EP will see again tomorrow after cath resulted  Chanetta Marshall, NP 12/28/2017 9:14 AM  Thompson Grayer MD, [facility] 12/28/2017 11:58 AM

## 2017-12-28 NOTE — Progress Notes (Addendum)
Called by nursing to evaluate patient due to NSVT. Chart reviewed, history of ICM and CAD with prior CABG, PAF, CKD, demtnia with inpatient VF arrest earlier this admit. Plans are for cardiac cath tomorrow. Has been on amiodarone, IV restarted this AM. Peak troponin 6.24. Echo LVEF 40-45%, grade II diastolic dysfunction down from 50-55%, with basal and midinferolateral and anterolateral hypokinesis  Patient examined increased work of breathing, diaphoretic. Chest soreness worst with deep breaths he attributes to prior CPR, no new chest tightness/pressure this evening. SBP in 180s, bilateral crackles. We will start NG drip for additional afterload, obtain stat ABG, CXR. Give lasix 60mg  IV x 1.  K 3.9, Cr 1.84, Hgb 9.1, Mg 2.6 12 lead EKG SR, incomplete LBBB unchanged  Medical therapy: ASA, atorva 40, hep gtt, toprol   We will see if he will stabilize with initial medical therapy. Very high bp's throughout the day, hopefully pulmonary edema related to his systolic dysfunction and severe HTN, follow with NG drip and IV lasix. If does not improve may have to go for cath tonight as opposed tomorrow.   Carlyle Dolly MD    [phone] addendum ABG 07/19/51/103 on NRB. We will place on bipap. I have asked nursing to notify primary team/critical care about his gas and respiratory status in case he further deteriorates and intubation is required. I discussed with Dr Scarlette Calico who is on for cath tonight to make him aware of patient, and will sign out patient to overnight fellow. Repeat ABG in 1 hr on bipap.    Carlyle Dolly MD

## 2017-12-28 NOTE — Progress Notes (Signed)
Patient went into briefly into SVT then transitioned to V-tach. Patient awake, breathing, wife at bedside. Dr. Wynonia Lawman called - per Dr. Wynonia Lawman re-start amio at 30 mg/hr and bolus with 150 mg. See MAR/Orders. Will continue to monitor. Roselyn Reef Fortune Brannigan,RN

## 2017-12-29 ENCOUNTER — Encounter (HOSPITAL_COMMUNITY): Payer: Self-pay | Admitting: Cardiology

## 2017-12-29 ENCOUNTER — Encounter (HOSPITAL_COMMUNITY)
Admission: EM | Disposition: A | Discharge status: Skilled Nursing Facility | Payer: Self-pay | Source: Home / Self Care | Attending: Family Medicine

## 2017-12-29 ENCOUNTER — Inpatient Hospital Stay (HOSPITAL_COMMUNITY)
Admission: EM | Disposition: A | Discharge status: Skilled Nursing Facility | Payer: Self-pay | Source: Home / Self Care | Attending: Family Medicine

## 2017-12-29 DIAGNOSIS — I5033 Acute on chronic diastolic (congestive) heart failure: Secondary | ICD-10-CM

## 2017-12-29 DIAGNOSIS — I255 Ischemic cardiomyopathy: Secondary | ICD-10-CM

## 2017-12-29 HISTORY — PX: LEFT HEART CATH AND CORS/GRAFTS ANGIOGRAPHY: CATH118250

## 2017-12-29 HISTORY — PX: ICD IMPLANT: EP1208

## 2017-12-29 LAB — CBC
HCT: 27.9 % — ABNORMAL LOW (ref 39.0–52.0)
Hemoglobin: 9.7 g/dL — ABNORMAL LOW (ref 13.0–17.0)
MCH: 34.3 pg — ABNORMAL HIGH (ref 26.0–34.0)
MCHC: 34.8 g/dL (ref 30.0–36.0)
MCV: 98.6 fL (ref 78.0–100.0)
Platelets: 311 10*3/uL (ref 150–400)
RBC: 2.83 MIL/uL — ABNORMAL LOW (ref 4.22–5.81)
RDW: 15.7 % — ABNORMAL HIGH (ref 11.5–15.5)
WBC: 10.3 10*3/uL (ref 4.0–10.5)

## 2017-12-29 LAB — BASIC METABOLIC PANEL
Anion gap: 13 (ref 5–15)
BUN: 36 mg/dL — ABNORMAL HIGH (ref 6–20)
CO2: 20 mmol/L — ABNORMAL LOW (ref 22–32)
Calcium: 9.6 mg/dL (ref 8.9–10.3)
Chloride: 103 mmol/L (ref 101–111)
Creatinine, Ser: 2.04 mg/dL — ABNORMAL HIGH (ref 0.61–1.24)
GFR calc Af Amer: 36 mL/min — ABNORMAL LOW (ref >60)
GFR calc non Af Amer: 31 mL/min — ABNORMAL LOW (ref >60)
Glucose, Bld: 121 mg/dL — ABNORMAL HIGH (ref 65–99)
Potassium: 4.4 mmol/L (ref 3.5–5.1)
Sodium: 136 mmol/L (ref 135–145)

## 2017-12-29 LAB — TROPONIN I
Troponin I: 3.73 ng/mL (ref <0.03)
Troponin I: 3.81 ng/mL (ref <0.03)

## 2017-12-29 LAB — GLUCOSE, CAPILLARY
Glucose-Capillary: 113 mg/dL — ABNORMAL HIGH (ref 65–99)
Glucose-Capillary: 98 mg/dL (ref 65–99)
Glucose-Capillary: 117 mg/dL — ABNORMAL HIGH (ref 65–99)
Glucose-Capillary: 132 mg/dL — ABNORMAL HIGH (ref 65–99)

## 2017-12-29 LAB — PROTIME-INR
INR: 1.34
Prothrombin Time: 16.5 seconds — ABNORMAL HIGH (ref 11.4–15.2)

## 2017-12-29 LAB — MAGNESIUM: Magnesium: 2.4 mg/dL (ref 1.7–2.4)

## 2017-12-29 LAB — HEPARIN LEVEL (UNFRACTIONATED): Heparin Unfractionated: 1.19 IU/mL — ABNORMAL HIGH (ref 0.30–0.70)

## 2017-12-29 LAB — BRAIN NATRIURETIC PEPTIDE: B Natriuretic Peptide: 953.1 pg/mL — ABNORMAL HIGH (ref 0.0–100.0)

## 2017-12-29 LAB — APTT: aPTT: 76 seconds — ABNORMAL HIGH (ref 24–36)

## 2017-12-29 SURGERY — LEFT HEART CATH AND CORS/GRAFTS ANGIOGRAPHY
Anesthesia: LOCAL

## 2017-12-29 SURGERY — ICD IMPLANT
Anesthesia: LOCAL

## 2017-12-29 MED ORDER — HEPARIN (PORCINE) IN NACL 100-0.45 UNIT/ML-% IJ SOLN: 950.0000 [IU]/h | INTRAMUSCULAR | Status: DC

## 2017-12-29 MED ORDER — SODIUM CHLORIDE 0.9% FLUSH
3.0000 mL | Freq: Two times a day (BID) | INTRAVENOUS | Status: DC
  Administered 2017-12-29 – 2018-01-02 (×7): 3 mL via INTRAVENOUS

## 2017-12-29 MED ORDER — CHLORHEXIDINE GLUCONATE 4 % EX LIQD
60.0000 mL | Freq: Once | CUTANEOUS | Status: DC
  Filled 2017-12-29: qty 15

## 2017-12-29 MED ORDER — SODIUM CHLORIDE 0.9 % IV SOLN: 250.0000 mL | INTRAVENOUS | Status: DC | PRN

## 2017-12-29 MED ORDER — SODIUM CHLORIDE 0.9 % IR SOLN
Status: AC
  Filled 2017-12-29: qty 2

## 2017-12-29 MED ORDER — IOPAMIDOL (ISOVUE-370) INJECTION 76%
INTRAVENOUS | Status: AC
Start: 2017-12-29 — End: 2017-12-29
  Filled 2017-12-29: qty 50

## 2017-12-29 MED ORDER — HEPARIN (PORCINE) IN NACL 2-0.9 UNIT/ML-% IJ SOLN
INTRAMUSCULAR | Status: AC | PRN
  Administered 2017-12-29: 1000 mL

## 2017-12-29 MED ORDER — CHLORHEXIDINE GLUCONATE 4 % EX LIQD
60.0000 mL | Freq: Once | CUTANEOUS | Status: AC
  Administered 2017-12-29: 4 via TOPICAL

## 2017-12-29 MED ORDER — LIDOCAINE HCL (PF) 1 % IJ SOLN
INTRAMUSCULAR | Status: DC | PRN
  Administered 2017-12-29: 60 mL

## 2017-12-29 MED ORDER — IPRATROPIUM-ALBUTEROL 0.5-2.5 (3) MG/3ML IN SOLN
3.0000 mL | Freq: Three times a day (TID) | RESPIRATORY_TRACT | Status: DC
  Administered 2017-12-30: 3 mL via RESPIRATORY_TRACT
  Filled 2017-12-29: qty 3

## 2017-12-29 MED ORDER — HEPARIN (PORCINE) IN NACL 2-0.9 UNIT/ML-% IJ SOLN
INTRAMUSCULAR | Status: AC
  Filled 2017-12-29: qty 1000

## 2017-12-29 MED ORDER — VANCOMYCIN HCL IN DEXTROSE 1-5 GM/200ML-% IV SOLN
1000.0000 mg | INTRAVENOUS | Status: AC
  Administered 2017-12-29: 1000 mg via INTRAVENOUS

## 2017-12-29 MED ORDER — LABETALOL HCL 100 MG PO TABS
100.0000 mg | ORAL_TABLET | Freq: Two times a day (BID) | ORAL | Status: DC
  Administered 2017-12-29 – 2017-12-31 (×4): 100 mg via ORAL
  Filled 2017-12-29 (×4): qty 1

## 2017-12-29 MED ORDER — VANCOMYCIN HCL IN DEXTROSE 1-5 GM/200ML-% IV SOLN
INTRAVENOUS | Status: AC
Start: 2017-12-29 — End: 2017-12-29
  Filled 2017-12-29: qty 200

## 2017-12-29 MED ORDER — FUROSEMIDE 10 MG/ML IJ SOLN
60.0000 mg | Freq: Once | INTRAMUSCULAR | Status: AC
  Administered 2017-12-30: 60 mg via INTRAVENOUS
  Filled 2017-12-29: qty 6

## 2017-12-29 MED ORDER — LIDOCAINE HCL (PF) 1 % IJ SOLN
INTRAMUSCULAR | Status: DC | PRN
  Administered 2017-12-29: 2 mL via SUBCUTANEOUS

## 2017-12-29 MED ORDER — SODIUM CHLORIDE 0.9% FLUSH: 3.0000 mL | INTRAVENOUS | Status: DC | PRN

## 2017-12-29 MED ORDER — ONDANSETRON HCL 4 MG/2ML IJ SOLN: 4.0000 mg | Freq: Four times a day (QID) | INTRAMUSCULAR | Status: DC | PRN

## 2017-12-29 MED ORDER — HEPARIN SODIUM (PORCINE) 1000 UNIT/ML IJ SOLN
INTRAMUSCULAR | Status: DC | PRN
  Administered 2017-12-29: 4000 [IU] via INTRAVENOUS

## 2017-12-29 MED ORDER — IOPAMIDOL (ISOVUE-370) INJECTION 76%
INTRAVENOUS | Status: AC
  Filled 2017-12-29: qty 100

## 2017-12-29 MED ORDER — VERAPAMIL HCL 2.5 MG/ML IV SOLN
INTRAVENOUS | Status: AC
  Filled 2017-12-29: qty 2

## 2017-12-29 MED ORDER — LIDOCAINE HCL (PF) 1 % IJ SOLN
INTRAMUSCULAR | Status: AC
  Filled 2017-12-29: qty 60

## 2017-12-29 MED ORDER — HEPARIN (PORCINE) IN NACL 2-0.9 UNIT/ML-% IJ SOLN
INTRAMUSCULAR | Status: AC | PRN
  Administered 2017-12-29: 500 mL

## 2017-12-29 MED ORDER — VANCOMYCIN HCL IN DEXTROSE 1-5 GM/200ML-% IV SOLN
1000.0000 mg | Freq: Two times a day (BID) | INTRAVENOUS | Status: AC
  Administered 2017-12-30: 1000 mg via INTRAVENOUS
  Filled 2017-12-29: qty 200

## 2017-12-29 MED ORDER — SODIUM CHLORIDE 0.9 % IR SOLN
80.0000 mg | Status: AC
  Administered 2017-12-29: 80 mg

## 2017-12-29 MED ORDER — SODIUM CHLORIDE 0.9 % IV SOLN: INTRAVENOUS | Status: DC

## 2017-12-29 MED ORDER — IOPAMIDOL (ISOVUE-370) INJECTION 76%
INTRAVENOUS | Status: DC | PRN
  Administered 2017-12-29: 75 mL via INTRA_ARTERIAL

## 2017-12-29 MED ORDER — HEPARIN SODIUM (PORCINE) 1000 UNIT/ML IJ SOLN
INTRAMUSCULAR | Status: AC
Start: 2017-12-29 — End: 2017-12-29
  Filled 2017-12-29: qty 1

## 2017-12-29 MED ORDER — ACETAMINOPHEN 325 MG PO TABS
325.0000 mg | ORAL_TABLET | ORAL | Status: DC | PRN
  Administered 2017-12-30 – 2018-01-02 (×4): 650 mg via ORAL
  Filled 2017-12-29 (×4): qty 2

## 2017-12-29 MED ORDER — VERAPAMIL HCL 2.5 MG/ML IV SOLN
INTRAVENOUS | Status: DC | PRN
  Administered 2017-12-29: 10 mL via INTRA_ARTERIAL

## 2017-12-29 MED ORDER — FUROSEMIDE 10 MG/ML IJ SOLN
60.0000 mg | Freq: Once | INTRAMUSCULAR | Status: AC
Start: 2017-12-29 — End: 2017-12-29
  Administered 2017-12-29: 60 mg via INTRAVENOUS
  Filled 2017-12-29: qty 6

## 2017-12-29 MED ORDER — LIDOCAINE HCL (PF) 1 % IJ SOLN
INTRAMUSCULAR | Status: AC
  Filled 2017-12-29: qty 30

## 2017-12-29 SURGICAL SUPPLY — 12 items
CATH 5FR JL3.5 JR4 ANG PIG MP (CATHETERS) ×1 IMPLANT
DEVICE RAD COMP TR BAND LRG (VASCULAR PRODUCTS) ×2 IMPLANT
GLIDESHEATH SLEND SS 6F .021 (SHEATH) ×1 IMPLANT
GUIDEWIRE INQWIRE 1.5J.035X260 (WIRE) IMPLANT
INQWIRE 1.5J .035X260CM (WIRE) ×2
KIT HEART LEFT (KITS) ×2 IMPLANT
PACK CARDIAC CATHETERIZATION (CUSTOM PROCEDURE TRAY) ×2 IMPLANT
SYR MEDRAD MARK V 150ML (SYRINGE) ×2 IMPLANT
TRANSDUCER W/STOPCOCK (MISCELLANEOUS) ×2 IMPLANT
TUBING ART PRESS 72  MALE/FEM (TUBING) ×1
TUBING ART PRESS 72 MALE/FEM (TUBING) IMPLANT
TUBING CIL FLEX 10 FLL-RA (TUBING) ×2 IMPLANT

## 2017-12-29 SURGICAL SUPPLY — 8 items
CABLE SURGICAL S-101-97-12 (CABLE) ×1 IMPLANT
GUIDEWIRE ANGLED .035X150CM (WIRE) ×1 IMPLANT
ICD ELLIPSE DR CD2411-36C (ICD Generator) ×1 IMPLANT
LEAD DURATA 7122-65CM (Lead) ×1 IMPLANT
PAD DEFIB LIFELINK (PAD) ×1 IMPLANT
SHEATH CLASSIC 7F (SHEATH) ×1 IMPLANT
SHEATH CLASSIC 8F 25CM (SHEATH) ×1 IMPLANT
TRAY PACEMAKER INSERTION (PACKS) ×1 IMPLANT

## 2017-12-29 NOTE — Progress Notes (Signed)
PT Cancellation Note  Patient Details Name: Joe Wiggins MRN: [id number] DOB: 03/05/1946   Cancelled Treatment:    Reason Eval/Treat Not Completed: Other (comment). Pt just got back from cath lab. RN requesting hold off PT evaluation until later today. Will follow-up as time allows.  Mabeline Caras, PT, DPT Acute Rehab Services  Pager: [place] 12/29/2017, 10:47 AM

## 2017-12-29 NOTE — Progress Notes (Signed)
Electrophysiology Rounding Note  [name] Date of Encounter: 12/29/2017  Primary Cardiologist: Wynonia Lawman Electrophysiologist: Allred   Subjective   The patient has persistent chest soreness from CPR.  Shortness of breath better than last night. He required Bipap last night for respiratory distress - no arrhythmias during that time.   Inpatient Medications    Scheduled Meds: . aspirin  81 mg Oral Pre-Cath  . aspirin  325 mg Oral Daily  . atorvastatin  40 mg Oral q1800  . furosemide      . guaiFENesin  30 mL Oral BID  . hydrALAZINE  100 mg Oral BID  . ipratropium-albuterol  3 mL Nebulization Q6H  . LORazepam      . mouth rinse  15 mL Mouth Rinse BID  . metoprolol succinate  12.5 mg Oral BID WC  . pantoprazole (PROTONIX) IV  40 mg Intravenous Q24H  . sodium chloride flush  3 mL Intravenous Q12H   Continuous Infusions: . sodium chloride    . sodium chloride     Followed by  . sodium chloride    . amiodarone 30 mg/hr (12/28/17 2111)  . cefTRIAXone (ROCEPHIN)  IV Stopped (12/28/17 1519)  . heparin 950 Units/hr (12/28/17 1500)  . lactated ringers 10 mL/hr at 12/28/17 1500  . nitroGLYCERIN    . nitroGLYCERIN 20 mcg/min (12/28/17 2113)   PRN Meds: sodium chloride, HYDROcodone-acetaminophen, levalbuterol, sodium chloride flush, traMADol   Vital Signs    Vitals:   12/29/17 0130 12/29/17 0248 12/29/17 0330 12/29/17 0400  BP: (!) 142/76  (!) 158/75 (!) 149/75  Pulse: 72   85  Resp: 13  13 (!) 23  Temp:    97.7 F (36.5 C)  TempSrc:    Oral  SpO2: 100% 100%  90%  Weight:      Height:        Intake/Output Summary (Last 24 hours) at 12/29/2017 0646 Last data filed at 12/29/2017 0400 Gross per 24 hour  Intake 887.74 ml  Output 550 ml  Net 337.74 ml   Filed Weights   12/26/17 0542 12/26/17 1028 12/27/17 0400  Weight: 164 lb 4.8 oz (74.5 kg) 167 lb 5.3 oz (75.9 kg) 168 lb 14 oz (76.6 kg)    Physical Exam    GEN- The patient is elderly appearing, alert  and oriented x 3 today.   Head- normocephalic, atraumatic Eyes-  Sclera clear, conjunctiva pink Ears- hearing intact Oropharynx- clear Neck- supple Lungs- Clear to ausculation bilaterally, slightly increased work of breathing  Heart- Regular rate and rhythm  GI- soft, NT, ND, + BS Extremities- no clubbing, cyanosis, or edema Skin- no rash or lesion Psych- euthymic mood, full affect Neuro- strength and sensation are intact  Labs    CBC Recent Labs    12/28/17 0436 12/29/17 0101  WBC 6.4 10.3  HGB 9.1* 9.7*  HCT 26.3* 27.9*  MCV 97.8 98.6  PLT 305 277   Basic Metabolic Panel Recent Labs    12/28/17 0436 12/29/17 0101  NA 134* 136  K 3.9 4.4  CL 103 103  CO2 22 20*  GLUCOSE 108* 121*  BUN 29* 36*  CREATININE 1.84* 2.04*  CALCIUM 9.1 9.6  MG 2.6* 2.4   Cardiac Enzymes Recent Labs    12/27/17 0407 12/28/17 2033 12/29/17 0101  TROPONINI 4.09* 1.99* 3.73*   Fasting Lipid Panel Recent Labs    12/26/17 0845  CHOL 146  HDL 32*  LDLCALC 81  TRIG 166*  CHOLHDL 4.6  Thyroid Function Tests Recent Labs    12/26/17 0840  TSH 1.980    Telemetry    Sinus rhythm (personally reviewed)  Radiology    Dg Chest Port 1 View  Result Date: 12/28/2017 CLINICAL DATA:  Shortness of breath EXAM: PORTABLE CHEST 1 VIEW COMPARISON:  Earlier today FINDINGS: Diffuse interstitial opacity that is similar to prior. Small left effusion. Chronic cardiomegaly. Dual-chamber pacer leads from the left. The patient is status post CABG and left carotid stenting. Chronic apical pleural thickening. No pneumothorax. IMPRESSION: CHF pattern without progression from earlier today. Electronically Signed   By: Monte Fantasia M.D.   On: 12/28/2017 19:38   Dg Chest Port 1 View  Result Date: 12/28/2017 CLINICAL DATA:  Acute respiratory failure, shortness of breath. History of COPD, hospital and community acquired pneumonia, cardiac arrest EXAM: PORTABLE CHEST 1 VIEW COMPARISON:  Chest x-ray of  December 26, 2017 FINDINGS: There has been interval extubation of the trachea. The lungs are well-expanded. The interstitial markings remain increased. Confluent density at the left lung base persists. Areas of near confluent density in the right mid and lower lung persist as well. The heart is top-normal in size. The pulmonary vascularity is not clearly engorged. The sternal wires are intact. The ICD is in stable position. There is calcification in the wall of the aortic arch. IMPRESSION: Slightly decreased pulmonary vascular congestion. Persistent but slightly improved interstitial edema bilaterally. Left basilar atelectasis or pneumonia. Electronically Signed   By: David  Martinique M.D.   On: 12/28/2017 07:07    Assessment & Plan    1.  VF arrest Plan for cath later today If no reversible causes, he meets ICD criteria for secondary prevention. I do not think he would be ready for ICD implant today with respiratory issues overnight.  Will follow up on cath results Continue IV amiodarone for now   2.  Paroxysmal atrial arrhythmias Asymptomatic Transition to Eliquis after ischemic eval  3.  CAD s/p CABG Cath today  4.  Respiratory distress Improved this morning after Bipap and Lasix last night   Dr Lovena Le to see later today   Signed, Chanetta Marshall, NP  12/29/2017, 6:46 AM   EP Attending  Patient seen and examined. Agree with above. The patient is much more alert this afternoon and would lke to proceed with upgrade to an ICD. As he has a Sport and exercise psychologist. Jude DDD PM in place, will plan to proceeding with upgrade to a DDD ICD. I have discussed the indications/risks/benefits/goals/expectations of ICD upgrade and he wishes to proceed.  Mikle Bosworth.D.

## 2017-12-29 NOTE — Progress Notes (Signed)
Patient returned to room from cath lab. Patient stable, awake and alert on arrival and requesting something to drink at this time. Assessment benign.

## 2017-12-29 NOTE — Progress Notes (Signed)
TR Band Removed  Site: Left Radial  Assessment: Level 0, noted dried blood on site  Time removed: 1525  Comments: Site dressed with gauze and tegaderm prior to draping for ICD implant.

## 2017-12-29 NOTE — Interval H&P Note (Signed)
History and Physical Interval Note:  12/29/2017 8:11 AM  Joe Wiggins  has presented today for surgery, with the diagnosis of ca  The various methods of treatment have been discussed with the patient and family. After consideration of risks, benefits and other options for treatment, the patient has consented to  Procedure(s): LEFT HEART CATH AND CORS/GRAFTS ANGIOGRAPHY (N/A) as a surgical intervention .  The patient's history has been reviewed, patient examined, no change in status, stable for surgery.  I have reviewed the patient's chart and labs.  Questions were answered to the patient's satisfaction.     Collier Salina [facility] 12/29/2017 8:12 AM

## 2017-12-29 NOTE — Progress Notes (Addendum)
Subjective:  Catheterization revealed his grafts to be patent this morning.  Creatinine was mildly up this morning and he had some episodes of nonsustained VT and SVT last night.  Required BiPAP for worsening breathing last night.  Deciding whether or not to have defibrillator later on this afternoon.  Has been hypertensive.  Objective:  Vital Signs in the last 24 hours: BP (!) 159/81   Pulse (!) 0   Temp 97.8 F (36.6 C) (Oral)   Resp 11   Ht 5\' 7"  (1.702 m)   Wt 75.8 kg (167 lb 1.7 oz)   SpO2 96%   BMI 26.17 kg/m   Physical Exam: Elderly male complaining of chest pain but in no acute distress Lungs: Reduced breath sounds, very tender chest wall  Cardiac:   Regular rhythm, normal S1 and S2, no S3, 2/6 systolic murmur Abdomen:  Soft, nontender, no masses Extremities:  No edema present  Intake/Output from previous day: 01/03 0701 - 01/04 0700 In: 919.9 [I.V.:919.9] Out: 1200 [Urine:1200]  Weight Filed Weights   12/26/17 1028 12/27/17 0400 12/29/17 0645  Weight: 75.9 kg (167 lb 5.3 oz) 76.6 kg (168 lb 14 oz) 75.8 kg (167 lb 1.7 oz)    Lab Results: Basic Metabolic Panel: Recent Labs    12/28/17 0436 12/29/17 0101  NA 134* 136  K 3.9 4.4  CL 103 103  CO2 22 20*  GLUCOSE 108* 121*  BUN 29* 36*  CREATININE 1.84* 2.04*   CBC: Recent Labs    12/28/17 0436 12/29/17 0101  WBC 6.4 10.3  HGB 9.1* 9.7*  HCT 26.3* 27.9*  MCV 97.8 98.6  PLT 305 311   Cardiac Panel (last 3 results) Recent Labs    12/28/17 2033 12/29/17 0101 12/29/17 0749  TROPONINI 1.99* 3.73* 3.81*    Telemetry: Currently in sinus rhythm.  Occasional PVCs.  Assessment/Plan:  1.  In-hospital cardiac arrest occurring in the setting of pneumonia-no potential reversible causes noted on catheterization as grafts were patent.   2.  History of ischemic cardiomyopathy 3.  Functioning pacemaker 4.  Mild dementia but he functions at a high level at home 5.  Chronic kidney disease mildly increased  today will need to follow 6.  CAD with patent grafts 7.  Hypertensive heart disease we'll reorder labetalol.  Recommendations:  Awaiting EP evaluation to determine timing of defibrillator.  Will need careful diuresis but also need to watch his renal function with recent cath today.    Kerry Hough  MD [facility]  12/29/2017, 1:15 PM

## 2017-12-29 NOTE — Progress Notes (Signed)
PULMONARY / CRITICAL CARE MEDICINE   Name: Joe Wiggins MRN: [id number] DOB: 1946-06-26    ADMISSION DATE:  12/25/2017 CONSULTATION DATE:  12/26/2017  REFERRING MD:  Yisroel Ramming  CHIEF COMPLAINT:  S/P Cardiopulmonary arrest.  HISTORY OF PRESENT ILLNESS:   This is a 72 year old with a history of paroxysmal atrial fibrillation, history of CABG, history of COPD, and history of dementia, who was admitted with dyspnea and a productive purulent cough, suspected RLL PNA.  He suffered VT/torsades arrest that required brief CPR and cardioversion.  He was moved to the ICU where respiratory status has been tenuous.  Has continued to have hypertension, runs of NSVT (wide-complex).. Difficulty with secretion management and cough.  Has intermittently required BiPAP post-extubation.   SUBJECTIVE:  Hypertensive through the day 1/3.  Recurrent episodes of wide-complex non-SVT on amiodarone.  Nitroglycerin drip initiated 1/3 p.m. Increased work of breathing 1/3 evening, combined respiratory metabolic acidosis and required BiPAP. Went for cardiac catheterization this morning 1/4  VITAL SIGNS: BP (!) 155/81   Pulse (!) 0   Temp 98.7 F (37.1 C) (Oral)   Resp 17   Ht 5\' 7"  (1.702 m)   Wt 75.8 kg (167 lb 1.7 oz)   SpO2 (!) 0%   BMI 26.17 kg/m   HEMODYNAMICS:   VENTILATOR SETTINGS: Vent Mode: PCV;BIPAP FiO2 (%):  [50 %-100 %] 100 % Set Rate:  [14 bmp] 14 bmp PEEP:  [5 cmH20] 5 cmH20 Pressure Support:  [5 cmH20] 5 cmH20  INTAKE / OUTPUT: I/O last 3 completed shifts: In: 1758.1 [I.V.:1758.1] Out: 0630 [Urine:1650]  PHYSICAL EXAMINATION: General: Elderly man, comfortable in bed on nasal cannula currently Neuro: Awake, alert, appropriate, following commands Cardiovascular: Regular, heart rate 77, no murmur Lungs:decreaed at both bases, no wheezing, mild inspiratory crackles MSK: Chest wall tender to palpation Abdomen: Soft, benign, positive bowel sounds EXt: No significant  edema   LABS:  BMET Recent Labs  Lab 12/27/17 1859 12/28/17 0436 12/29/17 0101  NA 134* 134* 136  K 4.2 3.9 4.4  CL 101 103 103  CO2 21* 22 20*  BUN 30* 29* 36*  CREATININE 1.94* 1.84* 2.04*  GLUCOSE 113* 108* 121*    Electrolytes Recent Labs  Lab 12/27/17 1344 12/27/17 1859 12/28/17 0436 12/29/17 0101  CALCIUM  --  9.1 9.1 9.6  MG 2.8*  --  2.6* 2.4    CBC Recent Labs  Lab 12/27/17 0407 12/28/17 0436 12/29/17 0101  WBC 8.9 6.4 10.3  HGB 9.8* 9.1* 9.7*  HCT 28.6* 26.3* 27.9*  PLT 265 305 311    Coag's Recent Labs  Lab 12/27/17 1344 12/28/17 0436 12/29/17 0101 12/29/17 0749  APTT 65* 73* 76*  --   INR  --   --   --  1.34    Sepsis Markers Recent Labs  Lab 12/25/17 1201 12/26/17 0845  LATICACIDVEN 1.33  --   PROCALCITON  --  0.16    ABG Recent Labs  Lab 12/26/17 0855 12/28/17 1925 12/28/17 2144  PHART 7.300* 7.247* 7.330*  PCO2ART 46.9 51.8* 40.3  PO2ART 239.0* 103 142*    Liver Enzymes Recent Labs  Lab 12/25/17 1113  AST 24  ALT 16*  ALKPHOS 120  BILITOT 0.8  ALBUMIN 3.7    Cardiac Enzymes Recent Labs  Lab 12/27/17 0407 12/28/17 2033 12/29/17 0101  TROPONINI 4.09* 1.99* 3.73*    Glucose Recent Labs  Lab 12/27/17 2122 12/28/17 0806 12/28/17 1157 12/28/17 1520 12/28/17 2039 12/29/17 0415  GLUCAP 103* 120* 117* 111* 127*  113*    Imaging Dg Chest Port 1 View  Result Date: 12/28/2017 CLINICAL DATA:  Shortness of breath EXAM: PORTABLE CHEST 1 VIEW COMPARISON:  Earlier today FINDINGS: Diffuse interstitial opacity that is similar to prior. Small left effusion. Chronic cardiomegaly. Dual-chamber pacer leads from the left. The patient is status post CABG and left carotid stenting. Chronic apical pleural thickening. No pneumothorax. IMPRESSION: CHF pattern without progression from earlier today. Electronically Signed   By: Monte Fantasia M.D.   On: 12/28/2017 19:38     STUDIES:   DISCUSSION: This is a 72 year old  with COPD who presented with shortness of breath and cough productive of purulent sputum.  Chest x-ray suggested right-sided  community-acquired pneumonia.  He suffered from a torsades arrest 1/1. He required a very brief resuscitation with 2 rounds of epinephrine given.  He was shocked into atrial fibrillation which was a perfusing rhythm.  He was purposeful following resuscitation. Extubated 1/2.  Has had difficulty clearing secretions, dyspnea, continued chest discomfort from his CPR. Continued HTN, worsening CHF pattern 1/3.   ASSESSMENT / PLAN:  PULMONARY A: Acute respiratory failure Probable right lower lobe community acquired pneumonia, pneumococcal antigen positive Community acquired pneumonia COPD Push pulmonary hygiene, secretion clearance.  Add a flutter valve when we believe he can tolerate with regard to his chest discomfort Diuresis and blood pressure control as below; pulmonary edema a contributor to his respiratory failure at this point Continue Xopenex nebulizers as needed to assist with secretion clearance Continue guaifenesin Pain control to assist with cough mechanics Complete course of ceftriaxone (day 4 of 7) Continue to keep BiPAP available to use as needed  CARDIOVASCULAR A:  Polymorphic VT arrest 1/1 Atrial fibrillation, RVR Coronary disease history of CABG Non-ST elevation MI HTN Diastolic CHF, worse 1/3 Appreciate cardiology assistance.  Nitroglycerin added for afterload reduction and blood pressure control on 1/3 Cardiac catheterization 1/4 shows that his vein grafts are patent Remains on amiodarone infusion Remains on heparin drip Hydralazine, metoprolol, aspirin, atorvastatin Need to continue diuresis as his renal function will tolerate; currently 2 L positive for the hospitalization   RENAL A: Acute on chronic oliguric renal failure, likely ATN, exacerbated somewhat by diuretics Hyperkalemia, resolved Follow BMP and urine output Reorder single dose  Lasix 60 mg now Avoid nephrotoxins Ensure adequate renal perfusion.    GASTROINTESTINAL A: Stress ulcer prophylaxis Pantoprazole ordered Okay to restart clear diet  NEUROLOGIC A:  Sedation for intubation, ventilation.  Discontinued Chest pain post CPR Added Vicodin low dose to his existing Ultram to help with chest pain and cough mechanics Avoid other sedating medications as able  Independent CC time 34 minutes  Baltazar Apo, MD, PhD 12/29/2017, 9:13 AM [facility] [phone] or if no answer [phone]

## 2017-12-29 NOTE — H&P (View-Only) (Signed)
Electrophysiology Rounding Note  [name] Date of Encounter: 12/29/2017  Primary Cardiologist: Wynonia Lawman Electrophysiologist: Allred   Subjective   The patient has persistent chest soreness from CPR.  Shortness of breath better than last night. He required Bipap last night for respiratory distress - no arrhythmias during that time.   Inpatient Medications    Scheduled Meds: . aspirin  81 mg Oral Pre-Cath  . aspirin  325 mg Oral Daily  . atorvastatin  40 mg Oral q1800  . furosemide      . guaiFENesin  30 mL Oral BID  . hydrALAZINE  100 mg Oral BID  . ipratropium-albuterol  3 mL Nebulization Q6H  . LORazepam      . mouth rinse  15 mL Mouth Rinse BID  . metoprolol succinate  12.5 mg Oral BID WC  . pantoprazole (PROTONIX) IV  40 mg Intravenous Q24H  . sodium chloride flush  3 mL Intravenous Q12H   Continuous Infusions: . sodium chloride    . sodium chloride     Followed by  . sodium chloride    . amiodarone 30 mg/hr (12/28/17 2111)  . cefTRIAXone (ROCEPHIN)  IV Stopped (12/28/17 1519)  . heparin 950 Units/hr (12/28/17 1500)  . lactated ringers 10 mL/hr at 12/28/17 1500  . nitroGLYCERIN    . nitroGLYCERIN 20 mcg/min (12/28/17 2113)   PRN Meds: sodium chloride, HYDROcodone-acetaminophen, levalbuterol, sodium chloride flush, traMADol   Vital Signs    Vitals:   12/29/17 0130 12/29/17 0248 12/29/17 0330 12/29/17 0400  BP: (!) 142/76  (!) 158/75 (!) 149/75  Pulse: 72   85  Resp: 13  13 (!) 23  Temp:    97.7 F (36.5 C)  TempSrc:    Oral  SpO2: 100% 100%  90%  Weight:      Height:        Intake/Output Summary (Last 24 hours) at 12/29/2017 0646 Last data filed at 12/29/2017 0400 Gross per 24 hour  Intake 887.74 ml  Output 550 ml  Net 337.74 ml   Filed Weights   12/26/17 0542 12/26/17 1028 12/27/17 0400  Weight: 164 lb 4.8 oz (74.5 kg) 167 lb 5.3 oz (75.9 kg) 168 lb 14 oz (76.6 kg)    Physical Exam    GEN- The patient is elderly appearing, alert  and oriented x 3 today.   Head- normocephalic, atraumatic Eyes-  Sclera clear, conjunctiva pink Ears- hearing intact Oropharynx- clear Neck- supple Lungs- Clear to ausculation bilaterally, slightly increased work of breathing  Heart- Regular rate and rhythm  GI- soft, NT, ND, + BS Extremities- no clubbing, cyanosis, or edema Skin- no rash or lesion Psych- euthymic mood, full affect Neuro- strength and sensation are intact  Labs    CBC Recent Labs    12/28/17 0436 12/29/17 0101  WBC 6.4 10.3  HGB 9.1* 9.7*  HCT 26.3* 27.9*  MCV 97.8 98.6  PLT 305 833   Basic Metabolic Panel Recent Labs    12/28/17 0436 12/29/17 0101  NA 134* 136  K 3.9 4.4  CL 103 103  CO2 22 20*  GLUCOSE 108* 121*  BUN 29* 36*  CREATININE 1.84* 2.04*  CALCIUM 9.1 9.6  MG 2.6* 2.4   Cardiac Enzymes Recent Labs    12/27/17 0407 12/28/17 2033 12/29/17 0101  TROPONINI 4.09* 1.99* 3.73*   Fasting Lipid Panel Recent Labs    12/26/17 0845  CHOL 146  HDL 32*  LDLCALC 81  TRIG 166*  CHOLHDL 4.6  Thyroid Function Tests Recent Labs    12/26/17 0840  TSH 1.980    Telemetry    Sinus rhythm (personally reviewed)  Radiology    Dg Chest Port 1 View  Result Date: 12/28/2017 CLINICAL DATA:  Shortness of breath EXAM: PORTABLE CHEST 1 VIEW COMPARISON:  Earlier today FINDINGS: Diffuse interstitial opacity that is similar to prior. Small left effusion. Chronic cardiomegaly. Dual-chamber pacer leads from the left. The patient is status post CABG and left carotid stenting. Chronic apical pleural thickening. No pneumothorax. IMPRESSION: CHF pattern without progression from earlier today. Electronically Signed   By: Monte Fantasia M.D.   On: 12/28/2017 19:38   Dg Chest Port 1 View  Result Date: 12/28/2017 CLINICAL DATA:  Acute respiratory failure, shortness of breath. History of COPD, hospital and community acquired pneumonia, cardiac arrest EXAM: PORTABLE CHEST 1 VIEW COMPARISON:  Chest x-ray of  December 26, 2017 FINDINGS: There has been interval extubation of the trachea. The lungs are well-expanded. The interstitial markings remain increased. Confluent density at the left lung base persists. Areas of near confluent density in the right mid and lower lung persist as well. The heart is top-normal in size. The pulmonary vascularity is not clearly engorged. The sternal wires are intact. The ICD is in stable position. There is calcification in the wall of the aortic arch. IMPRESSION: Slightly decreased pulmonary vascular congestion. Persistent but slightly improved interstitial edema bilaterally. Left basilar atelectasis or pneumonia. Electronically Signed   By: David  Martinique M.D.   On: 12/28/2017 07:07    Assessment & Plan    1.  VF arrest Plan for cath later today If no reversible causes, he meets ICD criteria for secondary prevention. I do not think he would be ready for ICD implant today with respiratory issues overnight.  Will follow up on cath results Continue IV amiodarone for now   2.  Paroxysmal atrial arrhythmias Asymptomatic Transition to Eliquis after ischemic eval  3.  CAD s/p CABG Cath today  4.  Respiratory distress Improved this morning after Bipap and Lasix last night   Dr Lovena Le to see later today   Signed, Chanetta Marshall, NP  12/29/2017, 6:46 AM   EP Attending  Patient seen and examined. Agree with above. The patient is much more alert this afternoon and would lke to proceed with upgrade to an ICD. As he has a Sport and exercise psychologist. Jude DDD PM in place, will plan to proceeding with upgrade to a DDD ICD. I have discussed the indications/risks/benefits/goals/expectations of ICD upgrade and he wishes to proceed.  Mikle Bosworth.D.

## 2017-12-29 NOTE — Progress Notes (Signed)
Patient transported to cath lab. Aspirin and Toprol given prior to transport. Report given to cath lab nurse. [name] directed to waiting room.

## 2017-12-29 NOTE — Progress Notes (Addendum)
FPTS Social Note  S: Mr. Getter was in the cath lab this morning, so I spoke with his wife regarding his condition.  He required Bipap and had some anxiety with it, but he was able to wean to nasal canula this morning.  He continues to have a cough and chest tenderness after CPR.  He continues to have hypertension and wide complex NSVT, managed with amiodarone and nitroglycerin drips.  O: BP (!) 153/70 (BP Location: Right Arm)   Pulse 77   Temp 98.5 F (36.9 C) (Oral)   Resp 20   Ht 5\' 7"  (1.702 m)   Wt 167 lb 1.7 oz (75.8 kg)   SpO2 92%   BMI 26.17 kg/m    A/P: Mr. Saiz continues to require intensive care due to his heart arrhythmia and respiratory distress.  We will continue to follow Mr. Bacigalupi socially while he is in CCM's care, and we appreciate the excellent care provided by CCM.  We will resume care after patient is transferred out of CCM.  Kathrene Alu, MD 12/29/2017, 9:41 AM PGY-1, [facility] Service pager [phone]

## 2017-12-29 NOTE — Progress Notes (Signed)
Patient taken to EP lab for ICD placement.

## 2017-12-29 NOTE — Progress Notes (Signed)
[place] for heparin  Indication: atrial fibrillation  Allergies  Allergen Reactions  . Ipratropium Other (See Comments)  . Morphine And Related Nausea Only  . Vicodin [Hydrocodone-Acetaminophen] Other (See Comments)    Hallucinations   . Penicillins Rash    Patient Measurements: Height: 5\' 7"  (170.2 cm) Weight: 167 lb 1.7 oz (75.8 kg) IBW/kg (Calculated) : 66.1   Vital Signs: Temp: 98.5 F (36.9 C) (01/04 0915) Temp Source: Oral (01/04 0915) BP: 153/70 (01/04 0915) Pulse Rate: 77 (01/04 0915)  Labs: Recent Labs    12/27/17 0407 12/27/17 1344 12/27/17 1859 12/28/17 0436 12/28/17 2033 12/29/17 0101 12/29/17 0749  HGB 9.8*  --   --  9.1*  --  9.7*  --   HCT 28.6*  --   --  26.3*  --  27.9*  --   PLT 265  --   --  305  --  311  --   APTT 108* 65*  --  73*  --  76*  --   LABPROT  --   --   --   --   --   --  16.5*  INR  --   --   --   --   --   --  1.34  HEPARINUNFRC >2.20*  --   --  1.46*  --  1.19*  --   CREATININE 1.94*  --  1.94* 1.84*  --  2.04*  --   TROPONINI 4.09*  --   --   --  1.99* 3.73* 3.81*    Estimated Creatinine Clearance: 30.6 mL/min (A) (by C-G formula based on SCr of 2.04 mg/dL (H)).   Medical History: Past Medical History:  Diagnosis Date  . AF (paroxysmal atrial fibrillation) ([place])   . CAD (coronary artery disease)   . Cardiomyopathy, ischemic   . Carotid artery occlusion   . Chronic kidney disease   . COPD (chronic obstructive pulmonary disease) ([place])   . Dementia without behavioral disturbance   . Hyperlipidemia   . Hypertension   . Sleep apnea   . Stroke ([place])   . TIA (transient ischemic attack)     Medications:   Scheduled:  . aspirin  325 mg Oral Daily  . atorvastatin  40 mg Oral q1800  . furosemide  60 mg Intravenous Once  . guaiFENesin  30 mL Oral BID  . hydrALAZINE  100 mg Oral BID  . ipratropium-albuterol  3 mL Nebulization Q6H  . mouth rinse  15 mL Mouth Rinse BID  .  metoprolol succinate  12.5 mg Oral BID WC  . pantoprazole (PROTONIX) IV  40 mg Intravenous Q24H  . sodium chloride flush  3 mL Intravenous Q12H    Assessment: 72 yo male s/p cardiac arrest with VDRF. He is on apixaban PTA for afib (last dose was 12/31 at 9:30pm as inpatient).   AM heparin level remains falsely elevated from recent apixaban.  PTT within goal range.  Patient now s/p cath lab, pharmacy asked to resume IV heparin 8 hrs after sheath removed.  Radial sheath out at 852 AM.  Goal of Therapy:  Heparin level 0.3-0.7 units/ml aPTT 66-102 seconds Monitor platelets by anticoagulation protocol: Yes   Plan:  -Restart heparin at 5 PM at rate of 950 units/hr. -Check aptt 8 hrs after gtt resumed. -daily aptt, heparin level and CBC   Uvaldo Rising, BCPS  Clinical Pharmacist Pager [phone]  12/29/2017 11:02 AM

## 2017-12-29 NOTE — Interval H&P Note (Signed)
History and Physical Interval Note:  12/29/2017 3:53 PM  Joe Wiggins  has presented today for surgery, with the diagnosis of vf arrest  The various methods of treatment have been discussed with the patient and family. After consideration of risks, benefits and other options for treatment, the patient has consented to  Procedure(s): ICD IMPLANT (N/A) as a surgical intervention .  The patient's history has been reviewed, patient examined, no change in status, stable for surgery.  I have reviewed the patient's chart and labs.  Questions were answered to the patient's satisfaction.     Cristopher Peru

## 2017-12-29 NOTE — Progress Notes (Signed)
Report given to [place], RN

## 2017-12-29 NOTE — Evaluation (Signed)
Physical Therapy Evaluation Patient Details Name: Joe Wiggins MRN: [id number] DOB: 1946-08-03 Today's Date: 12/29/2017   History of Present Illness  Pt is a 72 y.o. male admitted 12/25/17 with purulent cough and SOB consistent with RUL community acquired pneumonia found on CXR. He suffered VT/torsades arrest that required brief CPR and cardioversion; moved to ICU where respiratory status has been tenuous. Continued to have HTN, runs of NSVT (wide-complex).Has intermittently required BiPAP since extubation on 1/2. S/p cath 1/4. Taken for ICD placement 1/4. PMH significant for PAF, HTN, CAD s/p CABG and pacemaker, CKD III, COPD, OSA, dementia.    Clinical Impression  Pt presents with pain, decreased activity tolerance, decreased awareness, and an overall decrease in functional mobility secondary to above. PTA, pt indep with ambulation and ADLs; lives with wife who will be available for 24/7 support. Today, pt able to take steps from chair to bed with modA (+2 safety/lines), requiring increased time and multimodal cues due to difficulty sequencing and decreased safety awareness. Pt also very distracted by pain throughout chest (RN aware). Further mobility limited secondary to arrival of transport for ICD placement. Pt would benefit from continued acute PT services to maximize functional mobility and independence prior to d/c with SNF-level therapies.     Follow Up Recommendations SNF    Equipment Recommendations  Other (comment)(TBD)    Recommendations for Other Services OT consult     Precautions / Restrictions Precautions Precautions: Fall;ICD/Pacemaker Precaution Comments: Taken for ICD placement after PT eval Restrictions Weight Bearing Restrictions: No      Mobility  Bed Mobility Overal bed mobility: Needs Assistance Bed Mobility: Sit to Supine       Sit to supine: Mod assist   General bed mobility comments: ModA to assist BLEs into bed; pt able to bridge hips over once supine,  but declining being scooted up in bed  Transfers Overall transfer level: Needs assistance Equipment used: 1 person hand held assist Transfers: Sit to/from Stand Sit to Stand: Mod assist            Ambulation/Gait Ambulation/Gait assistance: Mod assist;+2 safety/equipment Ambulation Distance (Feet): 2 Feet Assistive device: 1 person hand held assist Gait Pattern/deviations: Step-to pattern;Antalgic;Leaning posteriorly Gait velocity: Decreased Gait velocity interpretation: <1.8 ft/sec, indicative of risk for recurrent falls General Gait Details: Took steps from chair to bed with modA and HHA to maintain balance (+2 safety/lines); pt with decreased sequencing, requiring multimodal cues for safety with walking to bed. Attempting to sit prematurely, decreased safety awareness  Stairs            Wheelchair Mobility    Modified Rankin (Stroke Patients Only)       Balance Overall balance assessment: Needs assistance   Sitting balance-[name]: Fair       Standing balance-[name]: Poor                               Pertinent Vitals/Pain Pain Assessment: Faces Pain Score: 9  Pain Location: Chest(since CPR) Pain Descriptors / Indicators: Aching;Sore Pain Intervention(s): Monitored during session;Limited activity within patient's tolerance    Home Living Family/patient expects to be discharged to:: Private residence Living Arrangements: Spouse/significant other Available Help at Discharge: Family;Available 24 hours/day(Wife) Type of Home: House Home Access: Stairs to enter Entrance Stairs-Rails: Right Entrance Stairs-Number of Steps: 1 Home Layout: One level Home Equipment: Walker - 2 wheels      Prior Function Level of Independence: Needs assistance  Gait / Transfers Assistance Needed: Indep with mobility and ADLs     Comments: Since CVA a few years ago, does not drive     Hand Dominance        Extremity/Trunk Assessment    Upper Extremity Assessment Upper Extremity Assessment: Generalized weakness    Lower Extremity Assessment Lower Extremity Assessment: Generalized weakness       Communication   Communication: HOH  Cognition Arousal/Alertness: Awake/alert Behavior During Therapy: WFL for tasks assessed/performed Overall Cognitive Status: History of cognitive impairments - at baseline Area of Impairment: Attention;Following commands;Safety/judgement;Awareness;Problem solving                   Current Attention Level: Sustained   Following Commands: Follows multi-step commands inconsistently Safety/Judgement: Decreased awareness of safety;Decreased awareness of deficits Awareness: Emergent Problem Solving: Decreased initiation;Difficulty sequencing;Requires verbal cues;Requires tactile cues        General Comments General comments (skin integrity, edema, etc.): Wife present during session    Exercises     Assessment/Plan    PT Assessment Patient needs continued PT services  PT Problem List Decreased strength;Decreased activity tolerance;Decreased balance;Decreased mobility;Decreased cognition;Decreased knowledge of use of DME;Decreased knowledge of precautions;Decreased safety awareness;Cardiopulmonary status limiting activity;Pain       PT Treatment Interventions DME instruction;Gait training;Stair training;Functional mobility training;Therapeutic activities;Therapeutic exercise;Balance training;Patient/family education    PT Goals (Current goals can be found in the Care Plan section)  Acute Rehab PT Goals Patient Stated Goal: Return home PT Goal Formulation: With patient/family Time For Goal Achievement: 01/12/18 Potential to Achieve Goals: Good    Frequency Min 2X/week   Barriers to discharge        Co-evaluation               AM-PAC PT "6 Clicks" Daily Activity  Outcome Measure Difficulty turning over in bed (including adjusting bedclothes, sheets and  blankets)?: Unable Difficulty moving from lying on back to sitting on the side of the bed? : Unable Difficulty sitting down on and standing up from a chair with arms (e.g., wheelchair, bedside commode, etc,.)?: Unable Help needed moving to and from a bed to chair (including a wheelchair)?: A Lot Help needed walking in hospital room?: A Lot Help needed climbing 3-5 steps with a railing? : A Lot 6 Click Score: 9    End of Session Equipment Utilized During Treatment: Oxygen Activity Tolerance: Patient limited by pain Patient left: in bed;with call bell/phone within reach;with nursing/sitter in room Nurse Communication: Mobility status PT Visit Diagnosis: Other abnormalities of gait and mobility (R26.89);Pain Pain - part of body: (Chest)    Time: 8938-1017 PT Time Calculation (min) (ACUTE ONLY): 17 min   Charges:   PT Evaluation $PT Eval High Complexity: 1 High     PT G Codes:       Mabeline Caras, PT, DPT Acute Rehab Services  Pager: [place] 12/29/2017, 5:16 PM

## 2017-12-29 NOTE — Progress Notes (Signed)
[place] for heparin  Indication: atrial fibrillation  Allergies  Allergen Reactions  . Ipratropium Other (See Comments)  . Morphine And Related Nausea Only  . Vicodin [Hydrocodone-Acetaminophen] Other (See Comments)    Hallucinations   . Penicillins Rash    Patient Measurements: Height: 5\' 7"  (170.2 cm) Weight: 167 lb 1.7 oz (75.8 kg) IBW/kg (Calculated) : 66.1   Vital Signs: Temp: 97.7 F (36.5 C) (01/04 0400) Temp Source: Oral (01/04 0400) BP: 160/79 (01/04 0600) Pulse Rate: 88 (01/04 0657)  Labs: Recent Labs    12/27/17 0407 12/27/17 1344 12/27/17 1859 12/28/17 0436 12/28/17 2033 12/29/17 0101  HGB 9.8*  --   --  9.1*  --  9.7*  HCT 28.6*  --   --  26.3*  --  27.9*  PLT 265  --   --  305  --  311  APTT 108* 65*  --  73*  --  76*  HEPARINUNFRC >2.20*  --   --  1.46*  --  1.19*  CREATININE 1.94*  --  1.94* 1.84*  --  2.04*  TROPONINI 4.09*  --   --   --  1.99* 3.73*    Estimated Creatinine Clearance: 30.6 mL/min (A) (by C-G formula based on SCr of 2.04 mg/dL (H)).   Medical History: Past Medical History:  Diagnosis Date  . AF (paroxysmal atrial fibrillation) ([place])   . CAD (coronary artery disease)   . Cardiomyopathy, ischemic   . Carotid artery occlusion   . Chronic kidney disease   . COPD (chronic obstructive pulmonary disease) ([employer])   . Dementia without behavioral disturbance   . Hyperlipidemia   . Hypertension   . Sleep apnea   . Stroke ([place])   . TIA (transient ischemic attack)     Medications:   Scheduled:  . aspirin  81 mg Oral Pre-Cath  . aspirin  325 mg Oral Daily  . atorvastatin  40 mg Oral q1800  . guaiFENesin  30 mL Oral BID  . hydrALAZINE  100 mg Oral BID  . ipratropium-albuterol  3 mL Nebulization Q6H  . LORazepam      . mouth rinse  15 mL Mouth Rinse BID  . metoprolol succinate  12.5 mg Oral BID WC  . pantoprazole (PROTONIX) IV  40 mg Intravenous Q24H  . sodium chloride flush  3 mL  Intravenous Q12H    Assessment: 72 yo male s/p cardiac arrest with VDRF. He is on apixaban PTA for afib (last dose was 12/31 at 9:30pm as inpatient). Plans noted for cath today.  AM heparin level remains falsely elevated from recent apixaban.  PTT within goal range.  Goal of Therapy:  Heparin level 0.3-0.7 units/ml aPTT 66-102 seconds Monitor platelets by anticoagulation protocol: Yes   Plan:  -No heparin changes needed -daily aptt, heparin level and CBC -F/u plans to resume heparin after cath lab today.   Uvaldo Rising, BCPS  Clinical Pharmacist Pager [phone]  12/29/2017 7:32 AM

## 2017-12-29 NOTE — Plan of Care (Signed)
Progressing

## 2017-12-30 ENCOUNTER — Inpatient Hospital Stay (HOSPITAL_COMMUNITY): Payer: PPO

## 2017-12-30 DIAGNOSIS — J9600 Acute respiratory failure, unspecified whether with hypoxia or hypercapnia: Secondary | ICD-10-CM

## 2017-12-30 DIAGNOSIS — J9601 Acute respiratory failure with hypoxia: Secondary | ICD-10-CM

## 2017-12-30 LAB — CBC
HCT: 28 % — ABNORMAL LOW (ref 39.0–52.0)
Hemoglobin: 9 g/dL — ABNORMAL LOW (ref 13.0–17.0)
MCH: 31.1 pg (ref 26.0–34.0)
MCHC: 32.1 g/dL (ref 30.0–36.0)
MCV: 96.9 fL (ref 78.0–100.0)
Platelets: 278 10*3/uL (ref 150–400)
RBC: 2.89 MIL/uL — ABNORMAL LOW (ref 4.22–5.81)
RDW: 15.9 % — ABNORMAL HIGH (ref 11.5–15.5)
WBC: 8.2 10*3/uL (ref 4.0–10.5)

## 2017-12-30 LAB — GLUCOSE, CAPILLARY
Glucose-Capillary: 106 mg/dL — ABNORMAL HIGH (ref 65–99)
Glucose-Capillary: 116 mg/dL — ABNORMAL HIGH (ref 65–99)
Glucose-Capillary: 117 mg/dL — ABNORMAL HIGH (ref 65–99)
Glucose-Capillary: 121 mg/dL — ABNORMAL HIGH (ref 65–99)

## 2017-12-30 LAB — BASIC METABOLIC PANEL
Anion gap: 11 (ref 5–15)
BUN: 36 mg/dL — ABNORMAL HIGH (ref 6–20)
CO2: 22 mmol/L (ref 22–32)
Calcium: 9.5 mg/dL (ref 8.9–10.3)
Chloride: 104 mmol/L (ref 101–111)
Creatinine, Ser: 1.97 mg/dL — ABNORMAL HIGH (ref 0.61–1.24)
GFR calc Af Amer: 37 mL/min — ABNORMAL LOW (ref >60)
GFR calc non Af Amer: 32 mL/min — ABNORMAL LOW (ref >60)
Glucose, Bld: 117 mg/dL — ABNORMAL HIGH (ref 65–99)
Potassium: 3.8 mmol/L (ref 3.5–5.1)
Sodium: 137 mmol/L (ref 135–145)

## 2017-12-30 LAB — CULTURE, BLOOD (ROUTINE X 2)
Culture: NO GROWTH
Culture: NO GROWTH
Special Requests: ADEQUATE
Special Requests: ADEQUATE

## 2017-12-30 LAB — MAGNESIUM: Magnesium: 2.2 mg/dL (ref 1.7–2.4)

## 2017-12-30 MED ORDER — GUAIFENESIN 100 MG/5ML PO SOLN
5.0000 mL | ORAL | Status: DC | PRN
  Administered 2017-12-30 – 2018-01-02 (×8): 100 mg via ORAL
  Filled 2017-12-30 (×8): qty 5

## 2017-12-30 MED ORDER — AMIODARONE HCL 200 MG PO TABS
400.0000 mg | ORAL_TABLET | Freq: Every day | ORAL | Status: DC
  Administered 2017-12-30 – 2018-01-02 (×4): 400 mg via ORAL
  Filled 2017-12-30 (×4): qty 2

## 2017-12-30 MED ORDER — METOPROLOL SUCCINATE ER 25 MG PO TB24
25.0000 mg | ORAL_TABLET | Freq: Two times a day (BID) | ORAL | Status: DC
  Administered 2017-12-30 – 2018-01-02 (×7): 25 mg via ORAL
  Filled 2017-12-30 (×7): qty 1

## 2017-12-30 MED ORDER — IPRATROPIUM-ALBUTEROL 0.5-2.5 (3) MG/3ML IN SOLN
3.0000 mL | RESPIRATORY_TRACT | Status: DC
  Administered 2017-12-30 – 2017-12-31 (×5): 3 mL via RESPIRATORY_TRACT
  Filled 2017-12-30 (×5): qty 3

## 2017-12-30 MED ORDER — BUDESONIDE 0.25 MG/2ML IN SUSP
0.2500 mg | Freq: Two times a day (BID) | RESPIRATORY_TRACT | Status: DC
  Administered 2017-12-30 – 2018-01-02 (×6): 0.25 mg via RESPIRATORY_TRACT
  Filled 2017-12-30 (×7): qty 2

## 2017-12-30 MED ORDER — DOCUSATE SODIUM 100 MG PO CAPS
100.0000 mg | ORAL_CAPSULE | Freq: Every day | ORAL | Status: DC
  Administered 2017-12-30 – 2018-01-02 (×4): 100 mg via ORAL
  Filled 2017-12-30 (×4): qty 1

## 2017-12-30 MED ORDER — POTASSIUM CHLORIDE CRYS ER 20 MEQ PO TBCR
40.0000 meq | EXTENDED_RELEASE_TABLET | Freq: Once | ORAL | Status: AC
  Administered 2017-12-30: 40 meq via ORAL

## 2017-12-30 NOTE — Progress Notes (Signed)
PULMONARY / CRITICAL CARE MEDICINE   Name: Joe Wiggins MRN: [id number] DOB: Mar 22, 1946    ADMISSION DATE:  12/25/2017 CONSULTATION DATE:  12/26/2017  REFERRING MD:  Yisroel Ramming  CHIEF COMPLAINT:  S/P Cardiopulmonary arrest. brief This is a 72 year old with a history of paroxysmal atrial fibrillation, history of CABG, history of COPD, and history of dementia, who was admitted with dyspnea and a productive purulent cough, suspected RLL PNA.  He suffered VT/torsades arrest that required brief CPR and cardioversion.  He was moved to the ICU where respiratory status has been tenuous.  Has continued to have hypertension, runs of NSVT (wide-complex).. Difficulty with secretion management and cough.  Has intermittently required BiPAP post-extubation.   EVENTS 1/4 - Hypertensive through the day 1/3.  Recurrent episodes of wide-complex non-SVT on amiodarone.  Nitroglycerin drip initiated 1/3 p.m. Increased work of breathing 1/3 evening, combined respiratory metabolic acidosis and required BiPAP. Went for cardiac catheterization this morning 1/4   SUBJECTIVE/OVERNIGHT/INTERVAL HX 1/5/9 - per Dr Wynonia Lawman cath shows patent grafts. In hospital arrest due to pna is dx. Now s.p defib. Wife says resp statu and mentally he is at baseline. O2 need is at 2L [place] and is new He is in amio gtt; Dr Wynonia Lawman plans for po amio 12/30/2017   Wife says opd pulm is Dr Josefina Do in [place]; not [place]   VITAL SIGNS: BP 128/67   Pulse (!) 136   Temp 97.9 F (36.6 C)   Resp 17   Ht 5\' 7"  (1.702 m)   Wt 77.7 kg (171 lb 4.8 oz)   SpO2 97%   BMI 26.83 kg/m   HEMODYNAMICS:   VENTILATOR SETTINGS:    INTAKE / OUTPUT: I/O last 3 completed shifts: In: 1591.8 [P.O.:240; I.V.:1051.8; IV Piggyback:300] Out: 1975 [Urine:1975]  PHYSICAL EXAMINATION:  General Appearance:    Looks well  Head:    Normocephalic, without obvious abnormality, atraumatic  Eyes:    PERRL - yes, conjunctiva/corneas - clear      Ears:    Normal  external ear canals, both ears  Nose:   NG tube - no but has [place]  Throat:  ETT TUBE - no , OG tube - no  Neck:   Supple,  No enlargement/tenderness/nodules     Lungs:     Clear to auscultation bilaterally,   Chest wall:    No deformity  Heart:    S1 and S2 normal, no murmur, CVP - no.  Pressors - no  Abdomen:     Soft, no masses, no organomegaly  Genitalia:    Not done  Rectal:   not done  Extremities:   Extremities- intact     Skin:   Intact in exposed areas . Sacral area - no decub     Neurologic:   Sedation - none -> RASS - 0 . Moves all 4s - yes. CAM-ICU - neg  . Orientation - x3+      LAB STUDIES:  PULMONARY Recent Labs  Lab 12/26/17 0855 12/28/17 1925 12/28/17 2144  PHART 7.300* 7.247* 7.330*  PCO2ART 46.9 51.8* 40.3  PO2ART 239.0* 103 142*  HCO3 23.1 21.8 20.7  TCO2 24  --   --   O2SAT 100.0 96.0 98.4    CBC Recent Labs  Lab 12/28/17 0436 12/29/17 0101 12/30/17 0339  HGB 9.1* 9.7* 9.0*  HCT 26.3* 27.9* 28.0*  WBC 6.4 10.3 8.2  PLT 305 311 278    COAGULATION Recent Labs  Lab 12/29/17 0749  INR 1.34  CARDIAC   Recent Labs  Lab 12/26/17 2049 12/27/17 0407 12/28/17 2033 12/29/17 0101 12/29/17 0749  TROPONINI 6.24* 4.09* 1.99* 3.73* 3.81*   No results for input(s): PROBNP in the last 168 hours.   CHEMISTRY Recent Labs  Lab 12/26/17 0845 12/27/17 0407 12/27/17 1344 12/27/17 1859 12/28/17 0436 12/29/17 0101 12/30/17 0339  NA 135 134*  --  134* 134* 136 137  K 4.8 5.0  --  4.2 3.9 4.4 3.8  CL 98* 104  --  101 103 103 104  CO2 19* 15*  --  21* 22 20* 22  GLUCOSE 159* 101*  --  113* 108* 121* 117*  BUN 25* 33*  --  30* 29* 36* 36*  CREATININE 1.99* 1.94*  --  1.94* 1.84* 2.04* 1.97*  CALCIUM 9.6 8.9  --  9.1 9.1 9.6 9.5  MG 3.7*  --  2.8*  --  2.6* 2.4 2.2   Estimated Creatinine Clearance: 31.7 mL/min (A) (by C-G formula based on SCr of 1.97 mg/dL (H)).   LIVER Recent Labs  Lab 12/25/17 1113 12/29/17 0749  AST 24  --   ALT  16*  --   ALKPHOS 120  --   BILITOT 0.8  --   PROT 7.1  --   ALBUMIN 3.7  --   INR  --  1.34     INFECTIOUS Recent Labs  Lab 12/25/17 1201 12/26/17 0845  LATICACIDVEN 1.33  --   PROCALCITON  --  0.16     ENDOCRINE CBG (last 3)  Recent Labs    12/29/17 2353 12/30/17 0413 12/30/17 0728  GLUCAP 106* 116* 117*         IMAGING x48h  - image(s) personally visualized  -   highlighted in bold Dg Chest 2 View  Result Date: 12/30/2017 CLINICAL DATA:  ICD placement, unable to raise LEFT arm. EXAM: CHEST  2 VIEW COMPARISON:  12/28/2017. FINDINGS: New LEFT-sided AICD, with an additional RIGHT ventricular lead placed. There is no pneumothorax. The heart remains enlarged. Moderate-sized effusions. Pulmonary edema pattern is improved. Chronic changes at the RIGHT lung apex are stable. Osteopenia. IMPRESSION: Satisfactory post AICD radiograph Electronically Signed   By: Staci Righter M.D.   On: 12/30/2017 08:36   Dg Chest Port 1 View  Result Date: 12/28/2017 CLINICAL DATA:  Shortness of breath EXAM: PORTABLE CHEST 1 VIEW COMPARISON:  Earlier today FINDINGS: Diffuse interstitial opacity that is similar to prior. Small left effusion. Chronic cardiomegaly. Dual-chamber pacer leads from the left. The patient is status post CABG and left carotid stenting. Chronic apical pleural thickening. No pneumothorax. IMPRESSION: CHF pattern without progression from earlier today. Electronically Signed   By: Monte Fantasia M.D.   On: 12/28/2017 19:38      DISCUSSION: This is a 72 year old with COPD who presented with shortness of breath and cough productive of purulent sputum.  Chest x-ray suggested right-sided  community-acquired pneumonia.  He suffered from a torsades arrest 1/1. He required a very brief resuscitation with 2 rounds of epinephrine given.  He was shocked into atrial fibrillation which was a perfusing rhythm.  He was purposeful following resuscitation. Extubated 1/2.  Has had difficulty  clearing secretions, dyspnea, continued chest discomfort from his CPR. Continued HTN, worsening CHF pattern 1/3.   ASSESSMENT / PLAN:  PULMONARY A: Acute respiratory failure Probable right lower lobe community acquired pneumonia, pneumococcal antigen positive Community acquired pneumonia COPD  12/30/2017 - remains extubated. Stable on 2-3 L [place] which is new requiresment since home  PLAN pulmonary hygiene, secretion clearance.   Add a flutter valve when we believe he can tolerate with regard to his chest discomfort Diuresis and blood pressure control as below; pulmonary edema a contributor to his respiratory failure at this point Continue Xopenex nebulizers as needed to assist with secretion clearance  Continue guaifenesin duoneb q4h _ + pulmicort neb big - > mdi at time of dc Pain control to assist with cough mechanics Complete course of ceftriaxone (day 5 of 7) Dc bipap (no longer hypercapnic and bettter()  CARDIOVASCULAR A:  Polymorphic VT arrest 1/1 Atrial fibrillation, RVR Coronary disease history of CABG Non-ST elevation MI HTN Diastolic CHF, worse 1/3 S/p defib 12/29/17  PLAN  - per cards; ensure diuresis   RENAL A: Acute on chronic oliguric renal failure, likely ATN, exacerbated somewhat by diuretics Hyperkalemia, resolved  Mild hypokalemia 12/30/2017  PLAN Kcl 40 Follow BMP and urine output Reorder single dose Lasix 60 mg now Avoid nephrotoxins Ensure adequate renal perfusion.    GASTROINTESTINAL A: Stress ulcer prophylaxis Pantoprazole ordered Heart health diet  NEUROLOGIC A:  Sedation for intubation, ventilation.  Discontinued Chest pain post CPR  PLA Vicodin low dose to his existing Ultram to help with chest pain and cough mechanics    GLOBAL Wife updated. Transfer to tele. FPTS  primary from 12/31/17 - paged and d/w resident  PCCM will sign off from 12/31/17  - please ensure opd pulm appt [phone] for [place]        Dr. Brand Males, M.D., [facility].C.P Pulmonary and Critical Care Medicine Staff Physician [facility] Pager: [phone], If no answer or between  15:00h - 7:00h: call [phone]  12/30/2017 11:51 AM

## 2017-12-30 NOTE — Progress Notes (Signed)
Pt transferred to 5W09. Pt is A&O x 3. Pt placed on telemtry and made comfortable. Wil. Continue to assess.l

## 2017-12-30 NOTE — Progress Notes (Signed)
FPTS Social Note  S: Saw and examined [name] this morning.  He is still struggling with a wet cough and chest wall pain from CPR.  Cath yesterday showed patent stents, and patient underwent ICD insertion yesterday.  Continues to have VT and SVT occasionally as well as shortness of breath.   O: BP 121/71   Pulse 74   Temp 98.5 F (36.9 C) (Oral)   Resp 18   Ht 5\' 7"  (1.702 m)   Wt 171 lb 4.8 oz (77.7 kg)   SpO2 94%   BMI 26.83 kg/m   Physical Exam  Constitutional: He appears well-developed and well-nourished. He appears ill. No distress.  Cardiovascular: Normal rate and regular rhythm.  No murmur heard. Pulmonary/Chest: Effort normal.  Coarse breath sounds bilaterally, likely transmitted upper airway congestion  Neurological: He is alert. He is not disoriented.  Psychiatric: His behavior is normal. His mood appears anxious.    A/P: [name] continues to require intensive care due to his heart arrhythmia and respiratory distress.  We will continue to follow [name] socially while he is in CCM's care, and we appreciate the excellent care provided by CCM.  We will resume care after patient is transferred out of CCM.  Kathrene Alu, MD 12/30/2017, 7:25 AM PGY-1, [facility] pager [phone]

## 2017-12-30 NOTE — Progress Notes (Signed)
Subjective:  Sitting up in chair at bedside today.  Still very sore in his chest.  Telemetry [place] shows some SVT as well as some VT.  Blood pressure is a little better controlled but still mildly elevated.  Objective:  Vital Signs in the last 24 hours: BP 128/67   Pulse (!) 136   Temp 97.9 F (36.6 C)   Resp 17   Ht 5\' 7"  (1.702 m)   Wt 77.7 kg (171 lb 4.8 oz)   SpO2 97%   BMI 26.83 kg/m   Physical Exam: Elderly male complaining of chest pain but in no acute distress Chest wall: ICD site not examined due to bandage-EP will check later Lungs: Reduced breath sounds, very tender chest wall  Cardiac:   Regular rhythm, normal S1 and S2, no S3, 2/6 systolic murmur Abdomen:  Soft, nontender, no masses Extremities:  1+ edema present  Intake/Output from previous day: 01/04 0701 - 01/05 0700 In: 1170.5 [P.O.:240; I.V.:630.5; IV Piggyback:300] Out: 1125 [Urine:1125]  Weight Filed Weights   12/27/17 0400 12/29/17 0645 12/30/17 0500  Weight: 76.6 kg (168 lb 14 oz) 75.8 kg (167 lb 1.7 oz) 77.7 kg (171 lb 4.8 oz)    Lab Results: Basic Metabolic Panel: Recent Labs    12/29/17 0101 12/30/17 0339  NA 136 137  K 4.4 3.8  CL 103 104  CO2 20* 22  GLUCOSE 121* 117*  BUN 36* 36*  CREATININE 2.04* 1.97*   CBC: Recent Labs    12/29/17 0101 12/30/17 0339  WBC 10.3 8.2  HGB 9.7* 9.0*  HCT 27.9* 28.0*  MCV 98.6 96.9  PLT 311 278   Cardiac Panel (last 3 results) Recent Labs    12/28/17 2033 12/29/17 0101 12/29/17 0749  TROPONINI 1.99* 3.73* 3.81*    Telemetry: Currently in sinus rhythm.  Occasional PVCs.,  Occasional SVT and runs of nonsustained VT  Assessment/Plan:  1.  In-hospital cardiac arrest occurring in the setting of pneumonia-no potential reversible causes noted on catheterization as grafts were patent.   2.  History of ischemic cardiomyopathy 3.  Upgrades to defibrillator currently some SVT and nonsustained VT 4.  Mild dementia but he functions at a high  level at home 5.  Chronic kidney disease mildly increased today will need to follow 6.  CAD with patent grafts 7.  Hypertensive heart disease placed back on labetalol yesterday blood pressure little better controlled   Recommendations:  I'm going to try to switch him off of amiodarone today him IV to by mouth now that he has a defibrillator in place.  Labetalol was restarted yesterday and I'll also have him on metoprolol.  Await EP recommendations regarding further antiarrhythmics.  Titrate metoprolol also in light of reduced LV function.  Kerry Hough  MD [facility]  12/30/2017, 1:15 PM

## 2017-12-31 DIAGNOSIS — J189 Pneumonia, unspecified organism: Secondary | ICD-10-CM

## 2017-12-31 DIAGNOSIS — R5381 Other malaise: Secondary | ICD-10-CM

## 2017-12-31 LAB — CBC WITH DIFFERENTIAL/PLATELET
Basophils Absolute: 0.1 10*3/uL (ref 0.0–0.1)
Basophils Relative: 1 %
Eosinophils Absolute: 0.3 10*3/uL (ref 0.0–0.7)
Eosinophils Relative: 4 %
HCT: 26.7 % — ABNORMAL LOW (ref 39.0–52.0)
Hemoglobin: 8.8 g/dL — ABNORMAL LOW (ref 13.0–17.0)
Lymphocytes Relative: 9 %
Lymphs Abs: 0.8 10*3/uL (ref 0.7–4.0)
MCH: 32.1 pg (ref 26.0–34.0)
MCHC: 33 g/dL (ref 30.0–36.0)
MCV: 97.4 fL (ref 78.0–100.0)
Monocytes Absolute: 1 10*3/uL (ref 0.1–1.0)
Monocytes Relative: 12 %
Neutro Abs: 6.3 10*3/uL (ref 1.7–7.7)
Neutrophils Relative %: 74 %
Platelets: 259 10*3/uL (ref 150–400)
RBC: 2.74 MIL/uL — ABNORMAL LOW (ref 4.22–5.81)
RDW: 16 % — ABNORMAL HIGH (ref 11.5–15.5)
WBC: 8.4 10*3/uL (ref 4.0–10.5)

## 2017-12-31 LAB — BASIC METABOLIC PANEL
Anion gap: 9 (ref 5–15)
BUN: 35 mg/dL — ABNORMAL HIGH (ref 6–20)
CO2: 22 mmol/L (ref 22–32)
Calcium: 9.6 mg/dL (ref 8.9–10.3)
Chloride: 103 mmol/L (ref 101–111)
Creatinine, Ser: 2.13 mg/dL — ABNORMAL HIGH (ref 0.61–1.24)
GFR calc Af Amer: 34 mL/min — ABNORMAL LOW (ref >60)
GFR calc non Af Amer: 29 mL/min — ABNORMAL LOW (ref >60)
Glucose, Bld: 111 mg/dL — ABNORMAL HIGH (ref 65–99)
Potassium: 3.5 mmol/L (ref 3.5–5.1)
Sodium: 134 mmol/L — ABNORMAL LOW (ref 135–145)

## 2017-12-31 LAB — PHOSPHORUS: Phosphorus: 3.7 mg/dL (ref 2.5–4.6)

## 2017-12-31 LAB — MAGNESIUM: Magnesium: 2.1 mg/dL (ref 1.7–2.4)

## 2017-12-31 MED ORDER — IPRATROPIUM-ALBUTEROL 0.5-2.5 (3) MG/3ML IN SOLN: 3.0000 mL | RESPIRATORY_TRACT | Status: DC | PRN

## 2017-12-31 MED ORDER — LABETALOL HCL 200 MG PO TABS
200.0000 mg | ORAL_TABLET | Freq: Two times a day (BID) | ORAL | Status: DC
  Administered 2017-12-31 – 2018-01-02 (×4): 200 mg via ORAL
  Filled 2017-12-31 (×4): qty 1

## 2017-12-31 MED ORDER — POTASSIUM CHLORIDE CRYS ER 20 MEQ PO TBCR
40.0000 meq | EXTENDED_RELEASE_TABLET | Freq: Once | ORAL | Status: AC
  Administered 2017-12-31: 40 meq via ORAL
  Filled 2017-12-31: qty 2

## 2017-12-31 MED ORDER — PANTOPRAZOLE SODIUM 40 MG PO TBEC
40.0000 mg | DELAYED_RELEASE_TABLET | Freq: Every day | ORAL | Status: DC
  Administered 2018-01-01 – 2018-01-02 (×2): 40 mg via ORAL
  Filled 2017-12-31 (×2): qty 1

## 2017-12-31 NOTE — Progress Notes (Signed)
Subjective:  He was moved out of the intensive care unit and is currently on a telemetry floor.  Still very sore in his chest from CPR.  Mild oxygen desaturations noted last night.  He is currently off of IV amiodarone and on oral medicine.  Blood pressure still mildly elevated but better on labetalol.  Objective:  Vital Signs in the last 24 hours: BP (!) 152/65   Pulse 78   Temp 97.8 F (36.6 C) (Oral)   Resp 18   Ht 5\' 7"  (1.702 m)   Wt 77.7 kg (171 lb 4.8 oz)   SpO2 97%   BMI 26.83 kg/m   Physical Exam: Elderly male complaining of chest pain but in no acute distress Chest wall: ICD site present Lungs: Reduced breath sounds, very tender chest wall  Cardiac:   Regular rhythm, normal S1 and S2, no S3, 2/6 systolic murmur Abdomen:  Soft, nontender, no masses Extremities:  1+ edema present  Intake/Output from previous day: 01/05 0701 - 01/06 0700 In: 333.7 [I.V.:283.7; IV Piggyback:50] Out: 750 [Urine:750]  Weight Filed Weights   12/27/17 0400 12/29/17 0645 12/30/17 0500  Weight: 76.6 kg (168 lb 14 oz) 75.8 kg (167 lb 1.7 oz) 77.7 kg (171 lb 4.8 oz)    Lab Results: Basic Metabolic Panel: Recent Labs    12/30/17 0339 12/31/17 0238  NA 137 134*  K 3.8 3.5  CL 104 103  CO2 22 22  GLUCOSE 117* 111*  BUN 36* 35*  CREATININE 1.97* 2.13*   CBC: Recent Labs    12/30/17 0339 12/31/17 0238  WBC 8.2 8.4  NEUTROABS  --  6.3  HGB 9.0* 8.8*  HCT 28.0* 26.7*  MCV 96.9 97.4  PLT 278 259   Cardiac Panel (last 3 results) Recent Labs    12/28/17 2033 12/29/17 0101 12/29/17 0749  TROPONINI 1.99* 3.73* 3.81*    Telemetry: Currently in sinus rhythm.  Occasional PVCs.,  Occasional SVT and runs of nonsustained VT  Assessment/Plan:  1.  In-hospital cardiac arrest occurring in the setting of pneumonia-no potential reversible causes noted on catheterization as grafts were patent.   2.  History of ischemic cardiomyopathy 3.  Upgrades to defibrillator  In SVT and VT  better occasional PVCs noted 4.  Mild dementia but he functions at a high level at home 5.  Chronic kidney disease mildly increased today will need to follow 6.  CAD with patent grafts 7.  Hypertensive heart disease placed back on labetalol yesterday blood pressure little better controlled   Recommendations:  Continue on metoprolol.  Labetalol increased to help blood pressure.  Need to watch renal function.  Would hold diuretics at the present time.  Get physical therapy to see the patient.  Kerry Hough  MD [facility]  12/31/2017, 1:15 PM

## 2017-12-31 NOTE — Progress Notes (Signed)
Family Medicine Teaching Service Daily Progress Note Intern Pager: [phone]  [name] Medical record number: [id number] Date of birth: 1946-04-22 Age: 72 y.o. Gender: male  Primary Care Provider: Leota Jacobsen, MD Consultants: CCM, Cardiology Code Status: full  Pt Overview and Major Events to Date:  Joe Wiggins is a 72 yo male who presented on 12/25/17 with cough productive of purulent sputum.  He was diagnosed with RLL CAP and given doxycycline and ceftriaxone.  On 12/26/17, patient went into VT/torsades cardiopulmonary arrest and was resuscitated and transferred to CCM.  While in CCM, he was extubated on 1/2, a heart cath was performed on 1/4 that showed patent stents, and an ICD was placed on 1/4.  He continued to have bouts of SVT and NSVT along with respiratory distress requiring BiPAP, but was able to wean off of his amiodarone and nitroglycerine drips and BiPAP by 1/5.  On 1/6, he was transferred back to our service.   Assessment and Plan:  RLL Community Acquired Pneumonia: Pneumococcal antigen positive.  Pneumonia believed to be the cause of patient's arrest since all stents were patent.  He continues to have secretions that are hard for him to clear due to his chest discomfort from CPR.  He is being treated with ceftriaxone and is currently on 3 L O2 via nasal canula.  - continue oxygen as needed, wean as tolerated - continue guaifenesin - continue Xopenex nebulizers PRN for assistance with secretion clearance - tramadol, norco, and tylenol for pain control to assist patient with coughing - ceftriaxone 1 g daily (day 6 of 7) - incentive spirometry - daily CBC - PT recommends SNF  COPD: Likely contributing to his acute lung disease. - Duoneb Q4H - on Spiriva 18 mcg daily, but will not add since on a LAMA with duoneb - Pulmicort nebulizer BID - switch to Pulmicort MDI at discharge  Heart arrhythmias, including SVT, nonsustained V tach, paroxysmal atrial  fibrillation: Patient has had PAF with RVR in the past as well as during this hospitalization.  Has been on an amiodarone drip until 1/5.  Cardiology managing. - appreciate cardiology recommendations - metoprolol 25 mg BID - labetalol 100 mg BID - amiodarone 400 mg daily - ASA 325 mg daily  CAD s/p CABG and ICD:  Patient has hx of MI and had an ICD placed on 1/4.  Cath on 1/4 showed patent grafts.  Cardiology managing. - ASA 325 daily - metoprolol - labetalol  CHF: EF 40-45% on 12/27/17; interval worsening from echo in 2017 - nitroglycerin gtt - cardiology will titrate metoprolol due to reduced LV function  HTN: Patient mildly hypertensive overnight, last BP 140/61. 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  Worsening kidney function, with creatinine 1.84 -> 2.04 -> 1.97 -> 2.13 on 1/6.  This is likely due to ischemic injury leading to ATN.  Patient no longer on diuretics.  K 3.5 on 1/6 - daily BMP  Dementia: wife says patient is highly functioning at home, at baseline currently  FEN/GI: heart healthy diet, protonix PPx: ASA, SCDs  Disposition: likely SNF  Subjective:  Joe Wiggins is feeling more comfortable this morning and has no complaints.  His cough is somewhat better today as well.  Objective: Temp:  [97.5 F (36.4 C)-98.1 F (36.7 C)] 97.7 F (36.5 C) (01/05 2241) Pulse Rate:  [64-136] 75 (01/06 0353) Resp:  [13-24] 18 (01/06 0353)  BP: (102-167)/(56-74) 155/74 (01/05 2241) SpO2:  [88 %-98 %] 93 % (01/06 0353) Weight:  [171 lb 4.8 oz (77.7 kg)] 171 lb 4.8 oz (77.7 kg) (01/05 0500) Physical Exam: Physical Exam  Constitutional: He appears well-developed and well-nourished. No distress.  HENT:  Head: Normocephalic and atraumatic.  Eyes: EOM are normal.  Neck: Normal range of motion.  Cardiovascular: Normal rate and regular rhythm.  Pulmonary/Chest: Effort normal. He exhibits tenderness.   Not currently on O2 Coarse breath sounds bilaterally  Abdominal: Soft. Bowel sounds are normal. He exhibits no distension. There is no tenderness.  Musculoskeletal: Normal range of motion.       Right lower leg: He exhibits no edema.       Left lower leg: He exhibits no edema.  Bandaged area where ICD was inserted; appears c/d/i  Neurological: He is alert.  Skin: Skin is warm and dry.  Psychiatric: He has a normal mood and affect. His behavior is normal.    Laboratory: Recent Labs  Lab 12/29/17 0101 12/30/17 0339 12/31/17 0238  WBC 10.3 8.2 8.4  HGB 9.7* 9.0* 8.8*  HCT 27.9* 28.0* 26.7*  PLT 311 278 259   Recent Labs  Lab 12/25/17 1113  12/29/17 0101 12/30/17 0339 12/31/17 0238  NA 132*   < > 136 137 134*  K 4.8   < > 4.4 3.8 3.5  CL 98*   < > 103 104 103  CO2 25   < > 20* 22 22  BUN 24*   < > 36* 36* 35*  CREATININE 1.96*   < > 2.04* 1.97* 2.13*  CALCIUM 9.9   < > 9.6 9.5 9.6  PROT 7.1  --   --   --   --   BILITOT 0.8  --   --   --   --   ALKPHOS 120  --   --   --   --   ALT 16*  --   --   --   --   AST 24  --   --   --   --   GLUCOSE 121*   < > 121* 117* 111*   < > = values in this interval not displayed.      Imaging/Diagnostic Tests: Dg Chest 2 View  Result Date: 12/30/2017 CLINICAL DATA:  ICD placement, unable to raise LEFT arm. EXAM: CHEST  2 VIEW COMPARISON:  12/28/2017. FINDINGS: New LEFT-sided AICD, with an additional RIGHT ventricular lead placed. There is no pneumothorax. The heart remains enlarged. Moderate-sized effusions. Pulmonary edema pattern is improved. Chronic changes at the RIGHT lung apex are stable. Osteopenia. IMPRESSION: Satisfactory post AICD radiograph Electronically Signed   By: Staci Righter M.D.   On: 12/30/2017 08:36    Winfrey, Alcario Drought, MD 12/31/2017, 4:16 AM PGY-1, [place] Intern pager: [phone], text pages welcome

## 2018-01-01 ENCOUNTER — Encounter (HOSPITAL_COMMUNITY): Payer: Self-pay | Admitting: Internal Medicine

## 2018-01-01 DIAGNOSIS — Z95810 Presence of automatic (implantable) cardiac defibrillator: Secondary | ICD-10-CM

## 2018-01-01 LAB — BASIC METABOLIC PANEL
Anion gap: 8 (ref 5–15)
BUN: 34 mg/dL — ABNORMAL HIGH (ref 6–20)
CO2: 22 mmol/L (ref 22–32)
Calcium: 9.4 mg/dL (ref 8.9–10.3)
Chloride: 107 mmol/L (ref 101–111)
Creatinine, Ser: 2 mg/dL — ABNORMAL HIGH (ref 0.61–1.24)
GFR calc Af Amer: 37 mL/min — ABNORMAL LOW (ref >60)
GFR calc non Af Amer: 32 mL/min — ABNORMAL LOW (ref >60)
Glucose, Bld: 104 mg/dL — ABNORMAL HIGH (ref 65–99)
Potassium: 3.9 mmol/L (ref 3.5–5.1)
Sodium: 137 mmol/L (ref 135–145)

## 2018-01-01 LAB — CBC WITH DIFFERENTIAL/PLATELET
Basophils Absolute: 0.1 10*3/uL (ref 0.0–0.1)
Basophils Relative: 1 %
Eosinophils Absolute: 0.4 10*3/uL (ref 0.0–0.7)
Eosinophils Relative: 5 %
HCT: 24.3 % — ABNORMAL LOW (ref 39.0–52.0)
Hemoglobin: 8.3 g/dL — ABNORMAL LOW (ref 13.0–17.0)
Lymphocytes Relative: 8 %
Lymphs Abs: 0.6 10*3/uL — ABNORMAL LOW (ref 0.7–4.0)
MCH: 34 pg (ref 26.0–34.0)
MCHC: 34.2 g/dL (ref 30.0–36.0)
MCV: 99.6 fL (ref 78.0–100.0)
Monocytes Absolute: 1.1 10*3/uL — ABNORMAL HIGH (ref 0.1–1.0)
Monocytes Relative: 14 %
Neutro Abs: 5.4 10*3/uL (ref 1.7–7.7)
Neutrophils Relative %: 72 %
Platelets: 238 10*3/uL (ref 150–400)
RBC: 2.44 MIL/uL — ABNORMAL LOW (ref 4.22–5.81)
RDW: 16.3 % — ABNORMAL HIGH (ref 11.5–15.5)
WBC: 7.6 10*3/uL (ref 4.0–10.5)

## 2018-01-01 LAB — MAGNESIUM: Magnesium: 2.1 mg/dL (ref 1.7–2.4)

## 2018-01-01 LAB — PHOSPHORUS: Phosphorus: 3.6 mg/dL (ref 2.5–4.6)

## 2018-01-01 MED ORDER — FUROSEMIDE 40 MG PO TABS
40.0000 mg | ORAL_TABLET | Freq: Two times a day (BID) | ORAL | Status: DC
  Administered 2018-01-01 – 2018-01-02 (×3): 40 mg via ORAL
  Filled 2018-01-01 (×3): qty 1

## 2018-01-01 MED ORDER — INSULIN GLARGINE 100 UNIT/ML ~~LOC~~ SOLN: 15.0000 [IU] | Freq: Every day | SUBCUTANEOUS | Status: DC

## 2018-01-01 MED ORDER — APIXABAN 5 MG PO TABS
5.0000 mg | ORAL_TABLET | Freq: Two times a day (BID) | ORAL | Status: DC
  Administered 2018-01-01 – 2018-01-02 (×3): 5 mg via ORAL
  Filled 2018-01-01 (×3): qty 1

## 2018-01-01 MED ORDER — TIOTROPIUM BROMIDE MONOHYDRATE 18 MCG IN CAPS
18.0000 ug | ORAL_CAPSULE | Freq: Every day | RESPIRATORY_TRACT | Status: DC
  Administered 2018-01-01 – 2018-01-02 (×2): 18 ug via RESPIRATORY_TRACT
  Filled 2018-01-01: qty 5

## 2018-01-01 MED ORDER — LEVALBUTEROL HCL 0.63 MG/3ML IN NEBU
0.6300 mg | INHALATION_SOLUTION | Freq: Four times a day (QID) | RESPIRATORY_TRACT | Status: DC | PRN
  Administered 2018-01-02: 0.63 mg via RESPIRATORY_TRACT
  Filled 2018-01-01: qty 3

## 2018-01-01 MED FILL — Vancomycin HCl-Dextrose IV Soln 1 GM/200ML-5%: INTRAVENOUS | Qty: 200 | Status: AC

## 2018-01-01 MED FILL — Gentamicin Sulfate Inj 40 MG/ML: INTRAMUSCULAR | Qty: 80 | Status: AC

## 2018-01-01 NOTE — Clinical Social Work Note (Signed)
Clinical Social Work Assessment  Patient Details  Name: Joe Wiggins MRN: [id number] Date of Birth: 04-04-1946  Date of referral:  01/01/18               Reason for consult:  Facility Placement                Permission sought to share information with:  Facility Sport and exercise psychologist, Family Supports Permission granted to share information::  Yes, Verbal Permission Granted  Name::     [place]::  SNFs  Relationship::  Spouse  Contact Information:  [phone]  Housing/Transportation Living arrangements for the past 2 months:  Single Family Home Source of Information:  Patient, Spouse Patient Interpreter Needed:  None Criminal Activity/Legal Involvement Pertinent to Current Situation/Hospitalization:  No - Comment as needed Significant Relationships:  Spouse Lives with:  Spouse Do you feel safe going back to the place where you live?  No Need for family participation in patient care:  Yes (Comment)  Care giving concerns:  CSW received consult for possible SNF placement at time of discharge. CSW spoke with patient and his wife regarding PT recommendation of SNF placement at time of discharge. Patient reported that patient's spouse is currently unable to care for patient at their home given patient's current physical needs and fall risk. Patient expressed understanding of PT recommendation and is agreeable to SNF placement at time of discharge. CSW to continue to follow and assist with discharge planning needs.   Social Worker assessment / plan:  CSW spoke with patient concerning possibility of rehab at [facility] before returning home.  Employment status:  Retired Nurse, adult PT Recommendations:  [place] / Referral to community resources:  [place]  Patient/Family's Response to care:  Patient recognizes need for rehab before returning home and is agreeable to a SNF in [place]. Patient's spouse reported  preference for [facility] since she has friends who have gone there.  Patient/Family's Understanding of and Emotional Response to Diagnosis, Current Treatment, and Prognosis:  Patient/family is realistic regarding therapy needs and expressed being hopeful for SNF placement. Patient expressed understanding of CSW role and discharge process as well as medical condition. No questions/concerns about plan or treatment.    Emotional Assessment Appearance:  Appears stated age Attitude/Demeanor/Rapport:  Engaged, Gracious, Charismatic Affect (typically observed):  Accepting, Appropriate, Pleasant Orientation:  Oriented to Self, Oriented to Situation, Oriented to Place, Oriented to  Time Alcohol / Substance use:  Not Applicable Psych involvement (Current and /or in the community):  No (Comment)  Discharge Needs  Concerns to be addressed:  Care Coordination Readmission within the last 30 days:  No Current discharge risk:  None Barriers to Discharge:  Continued Medical Work up   [employer], [place] 01/01/2018, 4:40 PM

## 2018-01-01 NOTE — Care Management Important Message (Signed)
Important Message  Patient Details  Name: Joe Wiggins MRN: [id number] Date of Birth: 1946-08-10   Medicare Important Message Given:  Yes    Aireal Slater [place] 01/01/2018, 9:30 AM

## 2018-01-01 NOTE — Progress Notes (Signed)
Physical Therapy Treatment Patient Details Name: Joe Wiggins MRN: [id number] DOB: 1946/01/29 Today's Date: 01/01/2018    History of Present Illness Pt is a 72 y.o. male admitted 12/25/17 with purulent cough and SOB consistent with RUL community acquired pneumonia found on CXR. He suffered VT/torsades arrest that required brief CPR and cardioversion; moved to ICU where respiratory status has been tenuous. Continued to have HTN, runs of NSVT (wide-complex).Has intermittently required BiPAP since extubation on 1/2. S/p cath 1/4. Taken for ICD placement 1/4. PMH significant for PAF, HTN, CAD s/p CABG and pacemaker, CKD III, COPD, OSA, dementia.    PT Comments    Pt is making progress towards his goals, however continues to be limited by chest pain with movement, and generalized weakness from deconditioning. Pt is currently min A for bed mobility, transfers and ambulation of 50 feet with RW. D/c plan continues to be appropriate as pt currently does not possess functional mobility to safely navigate his home environment. PT will continue to follow pt acutely until discharge.     Follow Up Recommendations  SNF     Equipment Recommendations  Other (comment)(TBD)    Recommendations for Other Services       Precautions / Restrictions Precautions Precautions: Fall;ICD/Pacemaker Restrictions Weight Bearing Restrictions: No    Mobility  Bed Mobility Overal bed mobility: Needs Assistance Bed Mobility: Supine to Sit;Sit to Supine     Supine to sit: Min assist Sit to supine: Min assist   General bed mobility comments: minA for trunk to upright and pad scoot to EoB, minA for LE management into bed  Transfers Overall transfer level: Needs assistance Equipment used: Rolling walker (2 wheeled) Transfers: Sit to/from [facility] Sit to Stand: Min assist Stand pivot transfers: Min assist       General transfer comment: minA for power up and steadying in  RW  Ambulation/Gait Ambulation/Gait assistance: Min assist Ambulation Distance (Feet): 50 Feet Assistive device: Rolling walker (2 wheeled) Gait Pattern/deviations: Step-through pattern;Shuffle;Decreased step length - right;Decreased step length - left;Trunk flexed Gait velocity: slowed Gait velocity interpretation: Below normal speed for age/gender General Gait Details: minA for steadying with gait, increasing support required as ambulation progressed, vc for increased foot clearance, upright posture, and proximity to RW         Balance Overall balance assessment: Needs assistance   Sitting balance-[name]: Good       Standing balance-[name]: Fair                              Cognition Arousal/Alertness: Awake/alert Behavior During Therapy: WFL for tasks assessed/performed Overall Cognitive Status: History of cognitive impairments - at baseline                                        Exercises Other Exercises Other Exercises: Marching while seated    General Comments General comments (skin integrity, edema, etc.): Pt on 2L supplemental O2 via nasal cannula at entry with SaO2 of 93%O2, with ambulation SaO2 dropped to 83%O2, O2 increased to 3L O2 and O2 rebounded to 90%O2, with ambulation back to room SaO2 dropped to 83%O2, supplemental O2 increased to 4L on wall unit and SaO2 quickly rebounded to 97%O2 within 3 minutes pt able to return to 2L via nasal cannula with SaO2 of 92%O2      Pertinent Vitals/Pain Pain  Assessment: Faces Faces Pain Scale: Hurts even more Pain Location: chest/ L shoulder Pain Descriptors / Indicators: Aching;Sore Pain Intervention(s): Limited activity within patient's tolerance;Monitored during session;Repositioned    Home Living Family/patient expects to be discharged to:: Private residence Living Arrangements: Spouse/significant other Available Help at Discharge: Family;Available 24 hours/day Type of Home:  House Home Access: Stairs to enter Entrance Stairs-Rails: Right Home Layout: One level Home Equipment: Environmental consultant - 2 wheels      Prior Function Level of Independence: Needs assistance  Gait / Transfers Assistance Needed: Indep with mobility and ADLs ADL's / Homemaking Assistance Needed: wife assisted with IADL tasks Comments: Since CVA a few years ago, does not drive   PT Goals (current goals can now be found in the care plan section) Acute Rehab PT Goals Patient Stated Goal: Return home PT Goal Formulation: With patient/family Time For Goal Achievement: 01/12/18 Potential to Achieve Goals: Good    Frequency    Min 2X/week       AM-PAC PT "6 Clicks" Daily Activity  Outcome Measure  Difficulty turning over in bed (including adjusting bedclothes, sheets and blankets)?: Unable Difficulty moving from lying on back to sitting on the side of the bed? : Unable Difficulty sitting down on and standing up from a chair with arms (e.g., wheelchair, bedside commode, etc,.)?: Unable Help needed moving to and from a bed to chair (including a wheelchair)?: A Little Help needed walking in hospital room?: A Little Help needed climbing 3-5 steps with a railing? : A Lot 6 Click Score: 11    End of Session Equipment Utilized During Treatment: Oxygen;Gait belt Activity Tolerance: Patient limited by fatigue Patient left: in bed;with call bell/phone within reach;with family/visitor present;with bed alarm set Nurse Communication: Mobility status;Other (comment)(O2 desaturation with ambulation) PT Visit Diagnosis: Other abnormalities of gait and mobility (R26.89);Pain Pain - part of body: (chest)     Time: 2458-0998 PT Time Calculation (min) (ACUTE ONLY): 20 min  Charges:  $Gait Training: 8-22 mins                    G Codes:       Joe Wiggins B. Migdalia Dk PT, DPT Acute Rehabilitation  [phone] Pager [phone]     [place] 01/01/2018, 4:37 PM

## 2018-01-01 NOTE — Progress Notes (Signed)
Occupational Therapy Evaluation Patient Details Name: Joe Wiggins MRN: [id number] DOB: 03/06/1946 Today's Date: 01/01/2018    History of Present Illness Pt is a 72 y.o. male admitted 12/25/17 with purulent cough and SOB consistent with RUL community acquired pneumonia found on CXR. He suffered VT/torsades arrest that required brief CPR and cardioversion; moved to ICU where respiratory status has been tenuous. Continued to have HTN, runs of NSVT (wide-complex).Has intermittently required BiPAP since extubation on 1/2. S/p cath 1/4. Taken for ICD placement 1/4. PMH significant for PAF, HTN, CAD s/p CABG and pacemaker, CKD III, COPD, OSA, dementia.   Clinical Impression   PTA, pt lived with wife and was modified independent with ADL and mobility. Wife assisted with IADL tasks and supervised ADL when needed due to history of dementia (per wife). Pt does not drive. Pt mobilized with min A and completed ADL with mod A. Pt desats on RA to 80 during ADL - nursing made aware. 2LO2 [place] replaced and O2 returned to 94. Recommend rehab at SNF to facilitate return to PLOF. Will follow acutely to address established goals and facilitate safe DC to next venue of care.     Follow Up Recommendations  SNF;Supervision/Assistance - 24 hour    Equipment Recommendations  3 in 1 bedside commode    Recommendations for Other Services       Precautions / Restrictions Precautions Precautions: Fall;ICD/Pacemaker Restrictions Weight Bearing Restrictions: No      Mobility Bed Mobility               General bed mobility comments: OOB on BSC  Transfers Overall transfer level: Needs assistance Equipment used: 1 person hand held assist Transfers: Sit to/from Stand;Stand Pivot Transfers Sit to Stand: Min assist Stand pivot transfers: Min assist            Balance Overall balance assessment: Needs assistance   Sitting balance-[name]: Fair       Standing balance-[name]: Fair                              ADL either performed or assessed with clinical judgement   ADL Overall ADL's : Needs assistance/impaired     Grooming: Minimal assistance;Sitting   Upper Body Bathing: Minimal assistance;Sitting   Lower Body Bathing: Moderate assistance;Sit to/from stand   Upper Body Dressing : Moderate assistance;Sitting   Lower Body Dressing: Moderate assistance;Sit to/from stand   Toilet Transfer: Minimal assistance;BSC;Stand-pivot   Toileting- Water quality scientist and Hygiene: Moderate assistance Toileting - Clothing Manipulation Details (indicate cue type and reason): fatigue limiting ability to clean self     Functional mobility during ADLs: Minimal assistance;Cueing for safety General ADL Comments: Desat on RA with ADL to 80; Easily fatigues; ambulated to chair form BSC with HHA +1.      Vision         Perception     Praxis      Pertinent Vitals/Pain Pain Assessment: Faces Faces Pain Scale: Hurts even more Pain Location: chest/ L shoulder Pain Descriptors / Indicators: Aching;Sore Pain Intervention(s): Limited activity within patient's tolerance     Hand Dominance Right   Extremity/Trunk Assessment Upper Extremity Assessment Upper Extremity Assessment: Generalized weakness   Lower Extremity Assessment Lower Extremity Assessment: Defer to PT evaluation   Cervical / Trunk Assessment Cervical / Trunk Assessment: Normal   Communication Communication Communication: HOH   Cognition Arousal/Alertness: Awake/alert Behavior During Therapy: WFL for tasks assessed/performed Overall Cognitive Status:  History of cognitive impairments - at baseline                                     General Comments       Exercises Exercises: Other exercises Other Exercises Other Exercises: Marching while seated   Shoulder Instructions      Home Living Family/patient expects to be discharged to:: Private residence Living Arrangements:  Spouse/significant other Available Help at Discharge: Family;Available 24 hours/day Type of Home: House Home Access: Stairs to enter [employer] of Steps: 1 Entrance Stairs-Rails: Right Home Layout: One level     Bathroom Shower/Tub: Corporate investment banker: Standard Bathroom Accessibility: Yes How Accessible: Accessible via walker Home Equipment: [place] - 2 wheels          Prior Functioning/Environment Level of Independence: Needs assistance  Gait / Transfers Assistance Needed: Indep with mobility and ADLs ADL's / Homemaking Assistance Needed: wife assisted with IADL tasks Communication / Swallowing Assistance Needed: HOH Comments: Since CVA a few years ago, does not drive        OT Problem List: Decreased strength;Decreased activity tolerance;Impaired balance (sitting and/or standing);Decreased safety awareness;Decreased knowledge of use of DME or AE;Decreased knowledge of precautions;Cardiopulmonary status limiting activity;Pain      OT Treatment/Interventions: Self-care/ADL training;Energy conservation;DME and/or AE instruction;Therapeutic exercise;Therapeutic activities;Cognitive remediation/compensation;Patient/family education;Balance training    OT Goals(Current goals can be found in the care plan section) Acute Rehab OT Goals Patient Stated Goal: Return home OT Goal Formulation: With patient/family Time For Goal Achievement: 01/15/18 Potential to Achieve Goals: Good ADL Goals Pt Will Perform Lower Body Bathing: sit to/from stand;with modified independence Pt Will Perform Lower Body Dressing: with modified independence;sit to/from stand Pt Will Transfer to Toilet: with modified independence;ambulating;bedside commode Pt Will Perform Toileting - Clothing Manipulation and hygiene: with modified independence Additional ADL Goal #1: Pt/wife will independently verbalize understanding of 3 energy conservation techniques for ADL tasks  OT  Frequency: Min 2X/week   Barriers to D/C:            Co-evaluation              AM-PAC PT "6 Clicks" Daily Activity     Outcome Measure Help from another person eating meals?: None Help from another person taking care of personal grooming?: A Little Help from another person toileting, which includes using toliet, bedpan, or urinal?: A Lot Help from another person bathing (including washing, rinsing, drying)?: A Lot Help from another person to put on and taking off regular upper body clothing?: A Little Help from another person to put on and taking off regular lower body clothing?: A Lot 6 Click Score: 16   End of Session Equipment Utilized During Treatment: Gait belt Nurse Communication: Mobility status  Activity Tolerance: Patient tolerated treatment well Patient left: in chair;with call bell/phone within reach;with chair alarm set;with family/visitor present  OT Visit Diagnosis: Unsteadiness on feet (R26.81);Muscle weakness (generalized) (M62.81);Pain;Other symptoms and signs involving cognitive function Pain - part of body: (chest/ L shoulder)                Time: 0109-3235 OT Time Calculation (min): 31 min Charges:  OT General Charges $OT Visit: 1 Visit OT Evaluation $OT Eval Moderate Complexity: 1 Mod OT Treatments $Self Care/Home Management : 8-22 mins G-Codes:     [facility], OT/L  [phone] 01/01/2018  Glendi Mohiuddin,HILLARY 01/01/2018, 10:28 AM

## 2018-01-01 NOTE — Progress Notes (Signed)
[place]unction for apixaban Indication: atrial fibrillation  Labs: Recent Labs    12/30/17 0339 12/31/17 0238 01/01/18 0249  HGB 9.0* 8.8* 8.3*  HCT 28.0* 26.7* 24.3*  PLT 278 259 238  CREATININE 1.97* 2.13* 2.00*    Assessment: 24 yom s/p in-hospital cardiac arrest, now with PPM and plans for upgrade to ICD on Friday. Pt with history of afib on apixaban PTA - has been held this admit. Pharmacy consulted to resume apixaban - home dose remains appropriate. Noted AKI this admit - SCr down to 2. CBC stable - Hg drifting down. No bleed documented.  Goal of Therapy:  Stroke prevention Monitor platelets by anticoagulation protocol: Yes   Plan:  Resume apixaban 5mg  PO BID from PTA Monitor CBC, s/sx bleeding  Elicia Lamp, PharmD, BCPS Clinical Pharmacist 01/01/2018 9:18 AM

## 2018-01-01 NOTE — Progress Notes (Signed)
Family Medicine Teaching Service Daily Progress Note Intern Pager: [phone]  [name] Medical record number: [id number] Date of birth: [birth year] Age: 72 y.o. Gender: male  Primary Care Provider: Leota Jacobsen, MD Consultants: CCM, Cardiology Code Status: full  Pt Overview and Major Events to Date:  Joe Wiggins is a 72 yo male who presented on 12/25/17 with cough productive of purulent sputum.  He was diagnosed with RLL CAP and given doxycycline and ceftriaxone.  On 12/26/17, patient went into VT/torsades cardiopulmonary arrest and was resuscitated and transferred to CCM.  While in CCM, he was extubated on 1/2, a heart cath was performed on 1/4 that showed patent stents, and an ICD was placed on 1/4.  He continued to have bouts of SVT and NSVT along with respiratory distress requiring BiPAP, but was able to wean off of his amiodarone and nitroglycerine drips and BiPAP by 1/5.  On 1/6, he was transferred back to our service.   Assessment and Plan:  RLL Community Acquired Pneumonia: Pneumococcal antigen positive.  Pneumonia believed to be the cause of patient's arrest since all stents were patent.  He continues to have secretions that are hard for him to clear due to his chest discomfort from CPR.  He is being treated with ceftriaxone and is currently on 3 L O2 via nasal canula.  - continue oxygen as needed, wean as tolerated - continue guaifenesin - continue Xopenex nebulizers PRN for assistance with secretion clearance - tramadol, norco, and tylenol for pain control to assist patient with coughing - ceftriaxone 1 g daily (day 6 of 7) - incentive spirometry - daily CBC - PT to see again today  COPD: Likely contributing to his acute lung disease. - Discontinue Duoneb Q4H - will add home Spiriva 18 mcg daily and Xopenex Q6H PRN in place of Duoneb - Pulmicort nebulizer BID - switch to Pulmicort MDI at discharge  Heart arrhythmias, including SVT, nonsustained V tach,  paroxysmal atrial fibrillation: Patient has had PAF with RVR in the past as well as during this hospitalization.  Has been on an amiodarone drip until 1/5.  Cardiology managing, increased labetalol to 200 mg BID yesterday. - appreciate cardiology recommendations - metoprolol 25 mg BID - labetalol 200 mg BID - amiodarone 400 mg daily - ASA 325 mg daily - will restart Eliquis today at 5 mg BID per cardiology recs.  Pharmacy has been consulted for this  CAD s/p CABG and ICD:  Patient has hx of MI and had an ICD placed on 1/4.  Cath on 1/4 showed patent grafts.  Cardiology managing. - ASA 325 daily - metoprolol - labetalol  CHF: EF 40-45% on 12/27/17; interval worsening from echo in 2017 - nitroglycerin gtt - beta blockers titrated due to reduced LV function - lasix 40 mg BID restarted today  HTN: Patient mildly hypertensive overnight, last BP 158/61. 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  Worsening kidney function, with creatinine 1.84 -> 2.04 -> 1.97 -> 2.13 -> 2.00 on 1/7.  This is likely due to ischemic injury leading to ATN.  Patient restarting diuretics today per cardiology due to improved renal function.  K 3.9 on 1/7 - daily BMP  Dementia: wife says patient is highly functioning at home, at baseline currently  FEN/GI: heart healthy diet, protonix PPx: ASA, SCDs  Disposition: SNF, waiting on bed placement  Subjective:  Joe Wiggins is complaining of  chest wall pain s/p CPR and ICD placement.  Bandages removed this morning by cardiology.  Says he didn't sleep well last night because his CPAP didn't fit well and kept falling off.  His wife is glad that he will be moving around with PT today.  Currently not short of breath and not on O2.  Objective: Temp:  [97.4 F (36.3 C)-98.4 F (36.9 C)] 97.4 F (36.3 C) (01/07 0457) Pulse Rate:  [65-78] 65 (01/07 0457) Resp:  [14-20] 18 (01/07 0457) BP:  (143-158)/(61-67) 158/61 (01/07 0457) SpO2:  [86 %-99 %] 99 % (01/07 0457) FiO2 (%):  [32 %] 32 % (01/06 0913) Physical Exam: Physical Exam  Constitutional: He appears well-developed and well-nourished. No distress.  HENT:  Head: Normocephalic and atraumatic.  Eyes: EOM are normal.  Neck: Normal range of motion.  Cardiovascular: Normal rate and regular rhythm.  Pulmonary/Chest: Effort normal. He exhibits tenderness.  Not currently on O2 Coarse breath sounds bilaterally  Abdominal: Soft. Bowel sounds are normal. He exhibits no distension. There is no tenderness.  Musculoskeletal: Normal range of motion.       Right lower leg: He exhibits no edema.       Left lower leg: He exhibits no edema.  Bandaged area where ICD was inserted; appears c/d/i  Neurological: He is alert.  Skin: Skin is warm and dry.  Psychiatric: He has a normal mood and affect. His behavior is normal.    Laboratory: Recent Labs  Lab 12/30/17 0339 12/31/17 0238 01/01/18 0249  WBC 8.2 8.4 7.6  HGB 9.0* 8.8* 8.3*  HCT 28.0* 26.7* 24.3*  PLT 278 259 238   Recent Labs  Lab 12/25/17 1113  12/30/17 0339 12/31/17 0238 01/01/18 0249  NA 132*   < > 137 134* 137  K 4.8   < > 3.8 3.5 3.9  CL 98*   < > 104 103 107  CO2 25   < > 22 22 22   BUN 24*   < > 36* 35* 34*  CREATININE 1.96*   < > 1.97* 2.13* 2.00*  CALCIUM 9.9   < > 9.5 9.6 9.4  PROT 7.1  --   --   --   --   BILITOT 0.8  --   --   --   --   ALKPHOS 120  --   --   --   --   ALT 16*  --   --   --   --   AST 24  --   --   --   --   GLUCOSE 121*   < > 117* 111* 104*   < > = values in this interval not displayed.      Imaging/Diagnostic Tests: No results found.  Kathrene Alu, MD 01/01/2018, 8:25 AM PGY-1, [place] Intern pager: [phone], text pages welcome

## 2018-01-01 NOTE — NC FL2 (Signed)
[place] LEVEL OF CARE SCREENING TOOL     IDENTIFICATION  [name] Birthdate: 1946/02/13 Sex: male Admission Date (Current Location): 12/25/2017  [facility] [place] and [place] Number:  Herbalist and Address:  [facility], [place] [address] [place], [place] [address]      Provider Number: [id number]  Attending Physician Name and Address:  Zenia Resides, MD  Relative Name and Phone Number:  Lynelle Smoke, spouse, [phone]    Current Level of Care: Hospital Recommended Level of Care: [place] Prior Approval Number:    Date Approved/Denied:   PASRR Number: [id number]  Discharge Plan: SNF    Current Diagnoses: Patient Active Problem List   Diagnosis Date Noted  . ICD (implantable cardioverter-defibrillator) in place   . Physical deconditioning   . Acute respiratory failure ([place])   . Cardiac arrest ([place])   . Community acquired pneumonia of right upper lobe of lung ([place])   . HAP (hospital-acquired pneumonia) 12/25/2017  . CAD (coronary artery disease), native coronary artery 01/20/2016  . Long-term (current) use of anticoagulants 01/20/2016  . History of TIA (transient ischemic attack) and stroke 01/20/2016  . Sleep apnea   . Cardiac pacemaker in situ   . Anemia 01/17/2016  . Bilateral carotid artery stenosis   . Hyperlipidemia   . Dementia without behavioral disturbance   . Hypertensive heart disease without CHF   . Paroxysmal atrial fibrillation (HCC)   . Chronic kidney disease, stage 3 ([place])   . COPD exacerbation ([place])     Orientation RESPIRATION BLADDER Height & Weight     Self, Time, Situation, Place  O2(Nasal cannula 2L) CPAP at night Continent, External catheter Weight: 77.7 kg (171 lb 4.8 oz) Height:  5\' 7"  (170.2 cm)  BEHAVIORAL SYMPTOMS/MOOD NEUROLOGICAL BOWEL NUTRITION STATUS      Continent Diet(Please see DC Summary)  AMBULATORY STATUS COMMUNICATION OF NEEDS Skin   Limited Assist  Verbally Surgical wounds(Closed incision on chest)                       Personal Care Assistance Level of Assistance  Bathing, Feeding, Dressing Bathing Assistance: Maximum assistance Feeding assistance: Independent Dressing Assistance: Limited assistance     Functional Limitations Info  Hearing, Sight Sight Info: Impaired Hearing Info: Impaired      SPECIAL CARE FACTORS FREQUENCY  PT (By licensed PT), OT (By licensed OT)     PT Frequency: 5x/week OT Frequency: 3x/week            Contractures      Additional Factors Info  Code Status, Allergies Code Status Info: Full Allergies Info: Ipratropium, Morphine And Related, Vicodin Hydrocodone-acetaminophen, Penicillins           Current Medications (01/01/2018):  This is the current hospital active medication list Current Facility-Administered Medications  Medication Dose Route Frequency Provider Last Rate Last Dose  . 0.9 %  sodium chloride infusion  250 mL Intravenous PRN Martinique, Peter M, MD 10 mL/hr at 12/30/17 1000 250 mL at 12/30/17 1000  . acetaminophen (TYLENOL) tablet 325-650 mg  325-650 mg Oral Q4H PRN Evans Lance, MD   650 mg at 01/01/18 1036  . amiodarone (PACERONE) tablet 400 mg  400 mg Oral Daily Jacolyn Reedy, MD   400 mg at 01/01/18 1040  . apixaban (ELIQUIS) tablet 5 mg  5 mg Oral BID Romona Curls, RPH   5 mg at 01/01/18 1044  .  aspirin tablet 325 mg  325 mg Oral Daily Sampson Goon, MD   325 mg at 01/01/18 1040  . atorvastatin (LIPITOR) tablet 40 mg  40 mg Oral q1800  Bing, DO   40 mg at 12/31/17 1702  . budesonide (PULMICORT) nebulizer solution 0.25 mg  0.25 mg Nebulization BID Brand Males, MD   0.25 mg at 01/01/18 0914  . docusate sodium (COLACE) capsule 100 mg  100 mg Oral Daily Guadalupe Dawn, MD   100 mg at 01/01/18 1034  . furosemide (LASIX) tablet 40 mg  40 mg Oral BID Jacolyn Reedy, MD   40 mg at 01/01/18 1044  . guaiFENesin (ROBITUSSIN) 100 MG/5ML solution 100  mg  5 mL Oral Q4H PRN Guadalupe Dawn, MD   100 mg at 01/01/18 1033  . hydrALAZINE (APRESOLINE) tablet 100 mg  100 mg Oral BID Ollis, Brandi L, NP   100 mg at 01/01/18 1034  . HYDROcodone-acetaminophen (NORCO/VICODIN) 5-325 MG per tablet 1 tablet  1 tablet Oral Q6H PRN Collene Gobble, MD   1 tablet at 12/31/17 2112  . labetalol (NORMODYNE) tablet 200 mg  200 mg Oral BID Jacolyn Reedy, MD   200 mg at 01/01/18 1036  . levalbuterol (XOPENEX) nebulizer solution 0.63 mg  0.63 mg Nebulization Q6H PRN Kathrene Alu, MD      . metoprolol succinate (TOPROL-XL) 24 hr tablet 25 mg  25 mg Oral BID WC Jacolyn Reedy, MD   25 mg at 01/01/18 1038  . nitroGLYCERIN 50 mg in dextrose 5 % 250 mL (0.2 mg/mL) infusion  0-200 mcg/min Intravenous Titrated Arnoldo Lenis, MD   Stopped at 12/29/17 2300  . ondansetron (ZOFRAN) injection 4 mg  4 mg Intravenous Q6H PRN Evans Lance, MD      . pantoprazole (PROTONIX) EC tablet 40 mg  40 mg Oral Daily Zenia Resides, MD   40 mg at 01/01/18 1038  . sodium chloride flush (NS) 0.9 % injection 3 mL  3 mL Intravenous Q12H Martinique, Peter M, MD   3 mL at 01/01/18 1039  . sodium chloride flush (NS) 0.9 % injection 3 mL  3 mL Intravenous PRN Martinique, Peter M, MD      . tiotropium [facility]) inhalation capsule 18 mcg  18 mcg Inhalation Daily Nicolette Bang, DO   18 mcg at 01/01/18 1431  . traMADol (ULTRAM) tablet 50 mg  50 mg Oral Q6H PRN Jacolyn Reedy, MD   50 mg at 12/30/17 0402     Discharge Medications: Please see discharge summary for a list of discharge medications.  Relevant Imaging Results:  Relevant Lab Results:   Additional Information SSN: [place], [place]

## 2018-01-01 NOTE — Discharge Instructions (Signed)
° ° °  Supplemental Discharge Instructions for  Pacemaker/Defibrillator Patients  Activity No heavy lifting or vigorous activity with your left/right arm for 6 to 8 weeks.  Do not raise your left/right arm above your head for one week.  Gradually raise your affected arm as drawn below.             01/02/18                       01/03/18                      01/04/18                  01/05/18 __   NO DRIVING 6 months  WOUND CARE - Keep the wound area clean and dry.  Do not get this area wet for one week. No showers for one week; you may shower on 1/11/9  . - The tape/steri-strips on your wound will fall off; do not pull them off.  No bandage is needed on the site.  DO  NOT apply any creams, oils, or ointments to the wound area. - If you notice any drainage or discharge from the wound, any swelling or bruising at the site, or you develop a fever > 101? F after you are discharged home, call the office at once.  Special Instructions - You are still able to use cellular telephones; use the ear opposite the side where you have your pacemaker/defibrillator.  Avoid carrying your cellular phone near your device. - When traveling through airports, show security personnel your identification card to avoid being screened in the metal detectors.  Ask the security personnel to use the hand wand. - Avoid arc welding equipment, MRI testing (magnetic resonance imaging), TENS units (transcutaneous nerve stimulators).  Call the office for questions about other devices. - Avoid electrical appliances that are in poor condition or are not properly grounded. - Microwave ovens are safe to be near or to operate.  Additional information for defibrillator patients should your device go off: - If your device goes off ONCE and you feel fine afterward, notify the device clinic nurses. - If your device goes off ONCE and you do not feel well afterward, call 911. - If your device goes off TWICE, call 911. - If your device goes  off THREE times in one day, call 911.  DO NOT DRIVE YOURSELF OR A FAMILY MEMBER WITH A DEFIBRILLATOR TO THE HOSPITAL--CALL 911.

## 2018-01-01 NOTE — Progress Notes (Signed)
Subjective:  Still very sore in his chest.  Not currently short of breath but is having significant chest wall tenderness from his CPR.  Awaiting PT evaluation.  Currently on oral medications.  Telemetry shows him to be in sinus rhythm.  Objective:  Vital Signs in the last 24 hours: BP (!) 158/61 (BP Location: Right Arm)   Pulse 65   Temp (!) 97.4 F (36.3 C) (Oral)   Resp 18   Ht 5\' 7"  (1.702 m)   Wt 77.7 kg (171 lb 4.8 oz)   SpO2 99%   BMI 26.83 kg/m   Physical Exam: Elderly male in no acute distress  Chest wall: ICD site without hematoma, Lungs: Reduced breath sounds, very tender chest wall  Cardiac:   Regular rhythm, normal S1 and S2, no S3, 2/6 systolic murmur Abdomen:  Soft, nontender, no masses Extremities:  1+ edema present  Intake/Output from previous day: 01/06 0701 - 01/07 0700 In: 120 [P.O.:120] Out: -   Weight Filed Weights   12/27/17 0400 12/29/17 0645 12/30/17 0500  Weight: 76.6 kg (168 lb 14 oz) 75.8 kg (167 lb 1.7 oz) 77.7 kg (171 lb 4.8 oz)    Lab Results: Basic Metabolic Panel: Recent Labs    12/31/17 0238 01/01/18 0249  NA 134* 137  K 3.5 3.9  CL 103 107  CO2 22 22  GLUCOSE 111* 104*  BUN 35* 34*  CREATININE 2.13* 2.00*   CBC: Recent Labs    12/31/17 0238 01/01/18 0249  WBC 8.4 7.6  NEUTROABS 6.3 5.4  HGB 8.8* 8.3*  HCT 26.7* 24.3*  MCV 97.4 99.6  PLT 259 238    Telemetry: Currently in sinus rhythm.  Personally reviewed  Assessment/Plan:  1.  In-hospital cardiac arrest occurring in the setting of pneumonia-no potential reversible causes noted on catheterization as grafts were patent.  Now with defibrillator 2.  History of ischemic cardiomyopathy 3.  Upgrades to defibrillator  4.  Mild dementia but he functions at a high level at home 5.  Chronic kidney disease appears to have plateaued.  May be secondary to the recent cath and hopefully will continue to improve.  With weight being up I would increase his diuretics. 6.  CAD  with patent grafts 7.  Hypertensive heart disease placed back on labetalol yesterday blood pressure little better controlled  8.  History of paroxysmal atrial fibrillation  Recommendations:  Needs physical therapy today.  I asked for EP to come by today and would like to restart Eliquis if it is okay with them.  Does not appear to have significant hematoma now.  Labetalol was increased yesterday.  Blood pressure still mildly increased.  His renal function appears to have stabilized and I think we can resume diuretics today.  Kerry Hough  MD [facility] Cardiology  01/01/2018, 1:15 PM

## 2018-01-01 NOTE — Progress Notes (Signed)
Electrophysiology Rounding Note  [name] Date of Encounter: 01/01/2018  Primary Cardiologist: Wynonia Lawman Electrophysiologist: Allred   Subjective   Remains with chest wall soreness, no anginal sounding complaints, no palpitations, minimal discomfort at ICD site, he is looking forward to PT evaluation today and mobilization, not SOB at rest   Inpatient Medications    Scheduled Meds: . amiodarone  400 mg Oral Daily  . apixaban  5 mg Oral BID  . aspirin  325 mg Oral Daily  . atorvastatin  40 mg Oral q1800  . budesonide (PULMICORT) nebulizer solution  0.25 mg Nebulization BID  . docusate sodium  100 mg Oral Daily  . furosemide  40 mg Oral BID  . hydrALAZINE  100 mg Oral BID  . labetalol  200 mg Oral BID  . metoprolol succinate  25 mg Oral BID WC  . pantoprazole  40 mg Oral Daily  . sodium chloride flush  3 mL Intravenous Q12H   Continuous Infusions: . sodium chloride 250 mL (12/30/17 1000)  . nitroGLYCERIN Stopped (12/29/17 2300)   PRN Meds: sodium chloride, acetaminophen, guaiFENesin, HYDROcodone-acetaminophen, ipratropium-albuterol, levalbuterol, ondansetron (ZOFRAN) IV, sodium chloride flush, traMADol   Vital Signs    Vitals:   12/31/17 2118 12/31/17 2221 01/01/18 0457 01/01/18 0914  BP:  (!) 143/63 (!) 158/61   Pulse:  69 65 76  Resp:  16 18 18   Temp:  97.8 F (36.6 C) (!) 97.4 F (36.3 C)   TempSrc:  Axillary Oral   SpO2: 99% 99% 99% 96%  Weight:      Height:       No intake or output data in the 24 hours ending 01/01/18 0930 Filed Weights   12/27/17 0400 12/29/17 0645 12/30/17 0500  Weight: 168 lb 14 oz (76.6 kg) 167 lb 1.7 oz (75.8 kg) 171 lb 4.8 oz (77.7 kg)    Physical Exam    GEN- The patient is elderly appearing, alert and oriented x 3 today.   Wiggins- normocephalic, atraumatic Eyes-  Sclera clear, conjunctiva pink Ears- hearing intact Oropharynx- clear Neck- supple Lungs- diminished throughout  Heart- Regular rate and rhythm  GI-  soft, NT, ND Extremities- no clubbing, cyanosis, trace-1+edema Skin- ICD site is dry, no bleeding, no hematoma Psych- euthymic mood, full affect Neuro- strength and sensation are intact  Labs    CBC Recent Labs    12/31/17 0238 01/01/18 0249  WBC 8.4 7.6  NEUTROABS 6.3 5.4  HGB 8.8* 8.3*  HCT 26.7* 24.3*  MCV 97.4 99.6  PLT 259 169   Basic Metabolic Panel Recent Labs    12/31/17 0238 01/01/18 0249  NA 134* 137  K 3.5 3.9  CL 103 107  CO2 22 22  GLUCOSE 111* 104*  BUN 35* 34*  CREATININE 2.13* 2.00*  CALCIUM 9.6 9.4  MG 2.1 2.1  PHOS 3.7 3.6   Cardiac Enzymes No results for input(s): CKTOTAL, CKMB, CKMBINDEX, TROPONINI in the last 72 hours. Fasting Lipid Panel No results for input(s): CHOL, HDL, LDLCALC, TRIG, CHOLHDL, LDLDIRECT in the last 72 hours. Thyroid Function Tests No results for input(s): TSH, T4TOTAL, T3FREE, THYROIDAB in the last 72 hours.  Invalid input(s): FREET3  Telemetry    Sinus rhythm, no V arrhythmias (personally reviewed)  Radiology    No results found.  Assessment & Plan    1.  VF arrest      Cath with patent grafts      On PO amiodarone      S/p  PPM upgrade to ICD Friday with Dr. Lovena Le      Site is stable      Device check done post op day #1 with stable findings      CXR done POD #1 without ptx      Wound care and activity restrictions were discussed today with the patient and wife at bedside      routine post device f/u has been arranged  No driving 6 months s/p cardiac arrest       2.  Paroxysmal atrial arrhythmias      OK to resume Eliquis today  3.  CAD s/p CABG       C/w Dr. Wynonia Lawman  4.  Respiratory distress      COPD, pneumonia     C/w attending service   EP service will sign off, though remain available, please recall if needed.    Signed, Baldwin Jamaica, PA-C  01/01/2018, 9:30 AM

## 2018-01-02 DIAGNOSIS — Z8674 Personal history of sudden cardiac arrest: Secondary | ICD-10-CM | POA: Diagnosis not present

## 2018-01-02 DIAGNOSIS — Z8673 Personal history of transient ischemic attack (TIA), and cerebral infarction without residual deficits: Secondary | ICD-10-CM | POA: Diagnosis not present

## 2018-01-02 DIAGNOSIS — K219 Gastro-esophageal reflux disease without esophagitis: Secondary | ICD-10-CM | POA: Diagnosis not present

## 2018-01-02 DIAGNOSIS — J189 Pneumonia, unspecified organism: Secondary | ICD-10-CM | POA: Diagnosis not present

## 2018-01-02 DIAGNOSIS — I509 Heart failure, unspecified: Secondary | ICD-10-CM | POA: Diagnosis not present

## 2018-01-02 DIAGNOSIS — J9601 Acute respiratory failure with hypoxia: Secondary | ICD-10-CM | POA: Diagnosis not present

## 2018-01-02 DIAGNOSIS — D649 Anemia, unspecified: Secondary | ICD-10-CM | POA: Diagnosis not present

## 2018-01-02 DIAGNOSIS — R079 Chest pain, unspecified: Secondary | ICD-10-CM | POA: Diagnosis not present

## 2018-01-02 DIAGNOSIS — I4891 Unspecified atrial fibrillation: Secondary | ICD-10-CM | POA: Diagnosis not present

## 2018-01-02 DIAGNOSIS — I462 Cardiac arrest due to underlying cardiac condition: Secondary | ICD-10-CM | POA: Diagnosis not present

## 2018-01-02 DIAGNOSIS — I480 Paroxysmal atrial fibrillation: Secondary | ICD-10-CM | POA: Diagnosis not present

## 2018-01-02 DIAGNOSIS — J181 Lobar pneumonia, unspecified organism: Secondary | ICD-10-CM | POA: Diagnosis not present

## 2018-01-02 DIAGNOSIS — R630 Anorexia: Secondary | ICD-10-CM | POA: Diagnosis not present

## 2018-01-02 DIAGNOSIS — R55 Syncope and collapse: Secondary | ICD-10-CM | POA: Diagnosis not present

## 2018-01-02 DIAGNOSIS — R5381 Other malaise: Secondary | ICD-10-CM | POA: Diagnosis not present

## 2018-01-02 DIAGNOSIS — I469 Cardiac arrest, cause unspecified: Secondary | ICD-10-CM | POA: Diagnosis not present

## 2018-01-02 DIAGNOSIS — F0150 Vascular dementia without behavioral disturbance: Secondary | ICD-10-CM | POA: Diagnosis not present

## 2018-01-02 DIAGNOSIS — R001 Bradycardia, unspecified: Secondary | ICD-10-CM | POA: Diagnosis not present

## 2018-01-02 DIAGNOSIS — R0789 Other chest pain: Secondary | ICD-10-CM | POA: Diagnosis not present

## 2018-01-02 DIAGNOSIS — Z95810 Presence of automatic (implantable) cardiac defibrillator: Secondary | ICD-10-CM | POA: Diagnosis not present

## 2018-01-02 DIAGNOSIS — I951 Orthostatic hypotension: Secondary | ICD-10-CM | POA: Diagnosis not present

## 2018-01-02 DIAGNOSIS — I10 Essential (primary) hypertension: Secondary | ICD-10-CM | POA: Diagnosis not present

## 2018-01-02 DIAGNOSIS — J441 Chronic obstructive pulmonary disease with (acute) exacerbation: Secondary | ICD-10-CM | POA: Diagnosis not present

## 2018-01-02 DIAGNOSIS — G4730 Sleep apnea, unspecified: Secondary | ICD-10-CM | POA: Diagnosis not present

## 2018-01-02 DIAGNOSIS — G4733 Obstructive sleep apnea (adult) (pediatric): Secondary | ICD-10-CM | POA: Diagnosis not present

## 2018-01-02 DIAGNOSIS — E785 Hyperlipidemia, unspecified: Secondary | ICD-10-CM | POA: Diagnosis not present

## 2018-01-02 DIAGNOSIS — Z950 Presence of cardiac pacemaker: Secondary | ICD-10-CM | POA: Diagnosis not present

## 2018-01-02 DIAGNOSIS — I25118 Atherosclerotic heart disease of native coronary artery with other forms of angina pectoris: Secondary | ICD-10-CM | POA: Diagnosis not present

## 2018-01-02 DIAGNOSIS — J449 Chronic obstructive pulmonary disease, unspecified: Secondary | ICD-10-CM | POA: Diagnosis not present

## 2018-01-02 DIAGNOSIS — I255 Ischemic cardiomyopathy: Secondary | ICD-10-CM | POA: Diagnosis not present

## 2018-01-02 DIAGNOSIS — M6281 Muscle weakness (generalized): Secondary | ICD-10-CM | POA: Diagnosis not present

## 2018-01-02 DIAGNOSIS — N183 Chronic kidney disease, stage 3 (moderate): Secondary | ICD-10-CM | POA: Diagnosis not present

## 2018-01-02 LAB — BASIC METABOLIC PANEL
Anion gap: 12 (ref 5–15)
BUN: 37 mg/dL — ABNORMAL HIGH (ref 6–20)
CO2: 21 mmol/L — ABNORMAL LOW (ref 22–32)
Calcium: 9.3 mg/dL (ref 8.9–10.3)
Chloride: 105 mmol/L (ref 101–111)
Creatinine, Ser: 2.19 mg/dL — ABNORMAL HIGH (ref 0.61–1.24)
GFR calc Af Amer: 33 mL/min — ABNORMAL LOW (ref >60)
GFR calc non Af Amer: 28 mL/min — ABNORMAL LOW (ref >60)
Glucose, Bld: 98 mg/dL (ref 65–99)
Potassium: 3.6 mmol/L (ref 3.5–5.1)
Sodium: 138 mmol/L (ref 135–145)

## 2018-01-02 MED ORDER — FUROSEMIDE 40 MG PO TABS: 40.0000 mg | ORAL_TABLET | Freq: Every day | ORAL | 0 refills | Status: DC

## 2018-01-02 MED ORDER — METOPROLOL SUCCINATE ER 25 MG PO TB24: 25.0000 mg | ORAL_TABLET | Freq: Two times a day (BID) | ORAL | 0 refills | Status: DC

## 2018-01-02 MED ORDER — AMIODARONE HCL 400 MG PO TABS: 400.0000 mg | ORAL_TABLET | Freq: Every day | ORAL | 0 refills | Status: DC

## 2018-01-02 MED ORDER — ASPIRIN 325 MG PO TABS: 325.0000 mg | ORAL_TABLET | Freq: Every day | ORAL | 0 refills | Status: DC

## 2018-01-02 MED ORDER — TRAMADOL HCL 50 MG PO TABS: 50.0000 mg | ORAL_TABLET | Freq: Four times a day (QID) | ORAL | 0 refills | Status: DC | PRN

## 2018-01-02 MED ORDER — FUROSEMIDE 40 MG PO TABS: 40.0000 mg | ORAL_TABLET | Freq: Every day | ORAL | Status: DC

## 2018-01-02 MED ORDER — BUDESONIDE 90 MCG/ACT IN AEPB: 1.0000 | INHALATION_SPRAY | Freq: Two times a day (BID) | RESPIRATORY_TRACT | 0 refills | Status: DC

## 2018-01-02 NOTE — Clinical Social Work Placement (Signed)
   CLINICAL SOCIAL WORK PLACEMENT  NOTE  Date:  01/02/2018  Patient Details  Name: Joe Wiggins MRN: [id number] Date of Birth: 1946-11-12  Clinical Social Work is seeking post-discharge placement for this patient at the [place] level of care (*CSW will initial, date and re-position this form in  chart as items are completed):  Yes   Patient/family provided with [place] Work Department's list of facilities offering this level of care within the geographic area requested by the patient (or if unable, by the patient's family).  Yes   Patient/family informed of their freedom to choose among providers that offer the needed level of care, that participate in Medicare, Medicaid or managed care program needed by the patient, have an available bed and are willing to accept the patient.  Yes   Patient/family informed of [facility]'s ownership interest in [facility] and [facility], as well as of the fact that they are under no obligation to receive care at these facilities.  PASRR submitted to EDS on       PASRR number received on       Existing PASRR number confirmed on 01/01/18     FL2 transmitted to all facilities in geographic area requested by pt/family on 01/01/18     FL2 transmitted to all facilities within larger geographic area on       Patient informed that his/her managed care company has contracts with or will negotiate with certain facilities, including the following:        Yes   Patient/family informed of bed offers received.  Patient chooses bed at [facility]     Physician recommends and patient chooses bed at      Patient to be transferred to [facility] on 01/02/18.  Patient to be transferred to facility by [facility]     Patient family notified on 01/02/18 of transfer.  Name of family member notified:  Spouse     PHYSICIAN Please prepare priority discharge summary, including medications      Additional Comment:    _______________________________________________ Benard Halsted, [place] 01/02/2018, 11:22 AM

## 2018-01-02 NOTE — Progress Notes (Signed)
Patient received insurance approval to go to [facility] today. [name] completing paperwork.  Joe Wiggins LCSWA [phone]

## 2018-01-02 NOTE — Progress Notes (Signed)
Subjective:  He is still very sore in his chest.  He still is asking exactly what happened to him.  Able to walk some yesterday with physical therapy.  No angina and no shortness of breath but still some oxygen desaturations.  Objective:  Vital Signs in the last 24 hours: BP (!) 142/59   Pulse 61   Temp 97.8 F (36.6 C) (Oral)   Resp 18   Ht 5\' 7"  (1.702 m)   Wt 77.7 kg (171 lb 4.8 oz)   SpO2 100%   BMI 26.83 kg/m   Physical Exam: Elderly male in no acute distress  Chest wall: ICD site without hematoma, Lungs: Reduced breath sounds, very tender chest wall  Cardiac:   Regular rhythm, normal S1 and S2, no S3, 2/6 systolic murmur Abdomen:  Soft, nontender, no masses Extremities:  1+ edema present  Intake/Output from previous day: 01/07 0701 - 01/08 0700 In: 440 [P.O.:440] Out: 1150 [Urine:1150]  Weight Filed Weights   12/27/17 0400 12/29/17 0645 12/30/17 0500  Weight: 76.6 kg (168 lb 14 oz) 75.8 kg (167 lb 1.7 oz) 77.7 kg (171 lb 4.8 oz)    Lab Results: Basic Metabolic Panel: Recent Labs    01/01/18 0249 01/02/18 0241  NA 137 138  K 3.9 3.6  CL 107 105  CO2 22 21*  GLUCOSE 104* 98  BUN 34* 37*  CREATININE 2.00* 2.19*   CBC: Recent Labs    12/31/17 0238 01/01/18 0249  WBC 8.4 7.6  NEUTROABS 6.3 5.4  HGB 8.8* 8.3*  HCT 26.7* 24.3*  MCV 97.4 99.6  PLT 259 238    Telemetry: Currently in sinus rhythm.  Personally reviewed  Assessment/Plan:  1.  In-hospital cardiac arrest occurring in the setting of pneumonia-no potential reversible causes noted on catheterization as grafts were patent.  Now with defibrillator 2.  History of ischemic cardiomyopathy 3.  Upgrades Of pacemaker to defibrillator currently functioning well. 4.  Mild dementia but he functions at a high level at home 5.  Chronic kidney disease appears to be slightly worse.  Diuretics were restarted yesterday but will reduce to once a day.  Continues to climb we'll need to hold. 6.  CAD with  patent grafts 7.  Hypertensive heart disease placed back on labetalol yesterday blood pressure little better controlled  8.  History of paroxysmal atrial fibrillation  Recommendations:  Eliquis was restarted.  I would reduce his furosemide to 40 mg a day and continue to follow renal function.  If continues to climb would hold diuretics. Awaiting physical therapy and disposition.Kerry Hough  MD [facility] Cardiology  01/02/2018, 1:15 PM

## 2018-01-02 NOTE — Progress Notes (Signed)
Len Childs to be D/C'd Rehab per MD order.  Discussed with the patient and all questions fully answered.  VSS, Skin clean, dry and intact without evidence of skin break down, no evidence of skin tears noted. IV catheter discontinued intact. Site without signs and symptoms of complications. Dressing and pressure applied.  An After Visit Summary was printed and given to the patient's spouse and sent to the facility.   D/c education completed with patient/family including follow up instructions, medication list, d/c activities limitations if indicated, with other d/c instructions as indicated by MD - patient able to verbalize understanding, all questions fully answered.    Patient escorted via stretcher, and D/C to rehab via ambulance.  Suella Broad, RN 01/02/2018 6:53 PM

## 2018-01-02 NOTE — Progress Notes (Signed)
Family Medicine Teaching Service Daily Progress Note Intern Pager: [phone]  [name] Medical record number: [id number] Date of birth: 07-Jan-1946 Age: 72 y.o. Gender: male  Primary Care Provider: Leota Jacobsen, MD Consultants: CCM, Cardiology Code Status: full  Pt Overview and Major Events to Date:  Joe Wiggins is a 72 yo male who presented on 12/25/17 with cough productive of purulent sputum.  He was diagnosed with RLL CAP and given doxycycline and ceftriaxone.  On 12/26/17, patient went into VT/torsades cardiopulmonary arrest and was resuscitated and transferred to CCM.  While in CCM, he was extubated on 1/2, a heart cath was performed on 1/4 that showed patent stents, and an ICD was placed on 1/4.  He continued to have bouts of SVT and NSVT along with respiratory distress requiring BiPAP, but was able to wean off of his amiodarone and nitroglycerine drips and BiPAP by 1/5.  On 1/6, he was transferred back to our service.   Assessment and Plan:  RLL Community Acquired Pneumonia: Pneumococcal antigen positive.  Pneumonia believed to be the cause of patient's arrest since all stents were patent.  He continues to have secretions that are hard for him to clear due to his chest discomfort from CPR.  He is being treated with ceftriaxone and is currently on 1 L O2 via nasal canula.  - continue oxygen as needed, wean as tolerated - continue guaifenesin - continue Xopenex nebulizers PRN for assistance with secretion clearance - tramadol, norco, and tylenol for pain control to assist patient with coughing - ceftriaxone 1 g daily (day 7 of 7) - incentive spirometry - daily CBC - PT recommends SNF  COPD: Likely contributing to his acute lung disease. - home Spiriva 18 mcg daily and Xopenex Q6H PRN in place of Duoneb - Pulmicort nebulizer BID - switch to Pulmicort MDI at discharge  Heart arrhythmias, including SVT, nonsustained V tach, paroxysmal atrial fibrillation: Patient has had  PAF with RVR in the past as well as during this hospitalization.  Has been on an amiodarone drip until 1/5.  Cardiology managing, increased labetalol to 200 mg BID yesterday. - appreciate cardiology recommendations - metoprolol 25 mg BID - labetalol 200 mg BID - amiodarone 400 mg daily - ASA 325 mg daily - will restart Eliquis today at 5 mg BID per cardiology recs.  Pharmacy has been consulted for this  CAD s/p CABG and ICD:  Patient has hx of MI and had an ICD placed on 1/4.  Cath on 1/4 showed patent grafts.  Cardiology managing. - ASA 325 daily - metoprolol - labetalol  CHF: EF 40-45% on 12/27/17; interval worsening from echo in 2017 - nitroglycerin gtt - beta blockers titrated due to reduced LV function - lasix 40 mg BID reduced to once daily due to worsening renal function per cards recs  HTN: Patient mildly hypertensive overnight, last BP 158/61. 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  Worsening kidney function, with creatinine 1.84 -> 2.04 -> 1.97 -> 2.13 -> 2.00 -> 2.19 on 1/8.  This is likely due to ischemic injury leading to ATN and diuretic therapy.  Patient restarted diuretics yesterday per cardiology due to improved renal function at that time.  K 3.6 on 1/8 - daily BMP  Dementia: wife says patient is highly functioning at home, at baseline currently  FEN/GI: heart healthy diet, protonix PPx: ASA, SCDs  Disposition: SNF, waiting on  bed placement  Subjective:  Joe Wiggins continues to complain of chest wall pain s/p CPR and ICD placement exacerbated by his cough.  Says he didn't sleep well last night because his CPAP didn't fit well and kept falling off.  He says his session with PT went well yesterday. Currently breathing comfortably on 1 L O2.  Objective: Temp:  [97.6 F (36.4 C)-98.1 F (36.7 C)] 97.8 F (36.6 C) (01/08 0554) Pulse Rate:  [62-76] 63 (01/08 0554) Resp:  [17-20] 18  (01/08 0554) BP: (100-144)/(55-74) 144/55 (01/08 0554) SpO2:  [96 %-100 %] 100 % (01/08 0554) Physical Exam: Physical Exam  Constitutional: He appears well-developed and well-nourished. No distress.  HENT:  Head: Normocephalic and atraumatic.  Eyes: EOM are normal.  Neck: Normal range of motion.  Cardiovascular: Normal rate and regular rhythm.  Pulmonary/Chest: Effort normal. He exhibits tenderness.  On 1L O2 Coarse breath sounds bilaterally  Abdominal: Soft. Bowel sounds are normal. He exhibits no distension. There is no tenderness.  Musculoskeletal: Normal range of motion.       Right lower leg: He exhibits no edema.       Left lower leg: He exhibits no edema.  Steristrips where ICD was inserted; appears c/d/i  Neurological: He is alert.  Skin: Skin is warm and dry.  Psychiatric: He has a normal mood and affect. His behavior is normal.    Laboratory: Recent Labs  Lab 12/30/17 0339 12/31/17 0238 01/01/18 0249  WBC 8.2 8.4 7.6  HGB 9.0* 8.8* 8.3*  HCT 28.0* 26.7* 24.3*  PLT 278 259 238   Recent Labs  Lab 12/31/17 0238 01/01/18 0249 01/02/18 0241  NA 134* 137 138  K 3.5 3.9 3.6  CL 103 107 105  CO2 22 22 21*  BUN 35* 34* 37*  CREATININE 2.13* 2.00* 2.19*  CALCIUM 9.6 9.4 9.3  GLUCOSE 111* 104* 98      Imaging/Diagnostic Tests: No results found.  Kathrene Alu, MD 01/02/2018, 7:47 AM PGY-1, [place] Intern pager: [phone], text pages welcome

## 2018-01-02 NOTE — Consult Note (Signed)
[facility] CM Primary Care Navigator  01/02/2018  Joe Wiggins Nov 16, 1946 [id number]   Met with patient and wife ([name]) at the bedside to identify possible discharge needs. Wife reports that patient washaving "fever, productive coughing and shortness of breath" that hadledto this admission.  [name] endorses Dr.Bobby Witten with [facility] [place] the primary care provider. She mentioned of plan to change to a new provider soon from [facility] (wife has the name and contact number of provider to contact after patient discharge) since they already moved to [place] area.   [name] [address] and [place] pharmacyto obtain medications without anyproblem.  Wife reportsmanaginghusband's medicationsat homeusing "pill box" system filled once a week.  Patientverbalized thatsheprovides transportation to hisdoctors'appointments.  [name] is his primary caregiver at home.  Anticipated discharge plan is skilled nursing facility (SNF- prefers [facility]) per therapy recommendationfor short termrehabilitationprior to returning home.  Patient/ wifevoiced understanding to callprimary care provider's officewhen hegets backhometo schedulea post discharge follow-upvisit within1-2 weeksor sooner if needed. Patient letter (with PCP's contact number) was provided as areminder.  Explained to patient and wifeaboutTHN CM services available for health management at home.Wife mentioned being able to manage his health issues so far with her assistance.  Patient/ wife voiced understandingto seek referral from primary care provider to [facility] care management asdeemed necessary and appropriatefor services needed in the future- whenhereturns back home.  [facility] care management information provided for future needs that may arise.   For additional questions  please contact:  Edwena Felty A. Yamilka Lopiccolo, BSN, RN-BC [facility] PRIMARY CARE Navigator Cell: [phone]

## 2018-01-02 NOTE — Progress Notes (Signed)
Report called to T, RN at [phone] of [facility].

## 2018-01-02 NOTE — Progress Notes (Signed)
CSW initiated insurance authorization with [employer] for patient to discharge to [facility]. Per wife, patient will bring his own CPAP from home to the SNF.   Percell Locus Jazmeen Axtell LCSWA [phone]

## 2018-01-02 NOTE — Progress Notes (Signed)
Patient will DC to: [facility] Anticipated DC date: 01/02/18 Family notified: Spouse Transport by: Domenica Reamer   Per MD patient ready for DC to [facility]. RN, patient, patient's family, and facility notified of DC. Discharge Summary sent to facility. RN given number for report [phone] Room 132). DC packet on chart. Ambulance transport requested for patient.   CSW signing off.  Cedric Fishman, [place] Social Worker [phone]

## 2018-01-03 ENCOUNTER — Non-Acute Institutional Stay (SKILLED_NURSING_FACILITY): Payer: PPO | Admitting: Adult Health

## 2018-01-03 ENCOUNTER — Encounter: Payer: Self-pay | Admitting: Adult Health

## 2018-01-03 DIAGNOSIS — F0280 Dementia in other diseases classified elsewhere without behavioral disturbance: Secondary | ICD-10-CM

## 2018-01-03 DIAGNOSIS — I480 Paroxysmal atrial fibrillation: Secondary | ICD-10-CM

## 2018-01-03 DIAGNOSIS — J189 Pneumonia, unspecified organism: Secondary | ICD-10-CM

## 2018-01-03 DIAGNOSIS — I25118 Atherosclerotic heart disease of native coronary artery with other forms of angina pectoris: Secondary | ICD-10-CM | POA: Diagnosis not present

## 2018-01-03 DIAGNOSIS — Z95810 Presence of automatic (implantable) cardiac defibrillator: Secondary | ICD-10-CM | POA: Diagnosis not present

## 2018-01-03 DIAGNOSIS — E7800 Pure hypercholesterolemia, unspecified: Secondary | ICD-10-CM | POA: Diagnosis not present

## 2018-01-03 DIAGNOSIS — Z950 Presence of cardiac pacemaker: Secondary | ICD-10-CM

## 2018-01-03 DIAGNOSIS — I469 Cardiac arrest, cause unspecified: Secondary | ICD-10-CM

## 2018-01-03 DIAGNOSIS — G309 Alzheimer's disease, unspecified: Secondary | ICD-10-CM | POA: Diagnosis not present

## 2018-01-03 DIAGNOSIS — I119 Hypertensive heart disease without heart failure: Secondary | ICD-10-CM

## 2018-01-03 DIAGNOSIS — N183 Chronic kidney disease, stage 3 unspecified: Secondary | ICD-10-CM

## 2018-01-03 DIAGNOSIS — J441 Chronic obstructive pulmonary disease with (acute) exacerbation: Secondary | ICD-10-CM | POA: Diagnosis not present

## 2018-01-03 DIAGNOSIS — J181 Lobar pneumonia, unspecified organism: Secondary | ICD-10-CM | POA: Diagnosis not present

## 2018-01-03 DIAGNOSIS — G4730 Sleep apnea, unspecified: Secondary | ICD-10-CM

## 2018-01-03 NOTE — Progress Notes (Signed)
Location:   [place] Room Number: [place] of Service:  SNF (31)   CODE STATUS: Full Code  Allergies  Allergen Reactions  . Ipratropium Other (See Comments)  . Morphine And Related Nausea Only  . Vicodin [Hydrocodone-Acetaminophen] Other (See Comments)    Hallucinations   . Penicillins Rash    Chief Complaint  Patient presents with  . Hospitalization Follow-up    Hospital Follow up    HPI:  He has been hospitalized for community acquired pneumonia. He did have a cardiac arrest and required CPR. He has had an ICD placed (also has Psychologist, forensic). He had a cardiac cath done as well. He has completed his abt for his pneumonia. He is here for short term rehab with his goal to return back home. He has one step to get into his house; his house is on one floor. His wife is available to him 24 hours per day. He continues to have a cough and shortness of breath. He has an incentive spirometry at his bedside. I have encouraged him to use this. His wife; Glenard and myself have initiated his MOST form. We have discussed the pros; cons and risks of his choices made and they have verbalized understanding.    Past Medical History:  Diagnosis Date  . AF (paroxysmal atrial fibrillation) ([place])   . CAD (coronary artery disease)   . Cardiomyopathy, ischemic   . Carotid artery occlusion   . Chronic kidney disease   . COPD (chronic obstructive pulmonary disease) ([place])   . Dementia without behavioral disturbance   . Hyperlipidemia   . Hypertension   . Sleep apnea   . Stroke ([place])   . TIA (transient ischemic attack)     Past Surgical History:  Procedure Laterality Date  . CARDIAC PACEMAKER PLACEMENT    . CATARACT EXTRACTION    . CORONARY ARTERY BYPASS GRAFT    . CORONARY STENT PLACEMENT    . EYE SURGERY    . ICD IMPLANT N/A 12/29/2017   Procedure: ICD IMPLANT;  Surgeon: Evans Lance, MD;  Location: [facility];  Service: Cardiovascular;  Laterality: N/A;  . LEFT  HEART CATH AND CORS/GRAFTS ANGIOGRAPHY N/A 12/29/2017   Procedure: LEFT HEART CATH AND CORS/GRAFTS ANGIOGRAPHY;  Surgeon: Martinique, Peter M, MD;  Location: [facility];  Service: Cardiovascular;  Laterality: N/A;    Social History   Socioeconomic History  . Marital status: Married    Spouse name: Not on file  . Number of children: Not on file  . Years of education: Not on file  . Highest education level: Not on file  Social Needs  . Financial resource strain: Not on file  . Food insecurity - worry: Not on file  . Food insecurity - inability: Not on file  . Transportation needs - medical: Not on file  . Transportation needs - non-medical: Not on file  Occupational History  . Not on file  Tobacco Use  . Smoking status: Former Smoker    Last attempt to quit: 11/30/2012    Years since quitting: 5.0  . Smokeless tobacco: Never Used  Substance and Sexual Activity  . Alcohol use: No  . Drug use: No  . Sexual activity: Not on file  Other Topics Concern  . Not on file  Social History Narrative  . Not on file   Family History  Problem Relation Age of Onset  . Hypertension Mother   . Heart disease Mother   .  Hypertension Sister   . Heart disease Sister       VITAL SIGNS BP (!) 152/60   Pulse 66   Temp (!) 96.4 F (35.8 C)   Resp 20   Ht 5\' 8"  (1.727 m)   Wt 154 lb 6.4 oz (70 kg)   SpO2 92%   BMI 23.48 kg/m    Outpatient Encounter Medications as of 01/03/2018  Medication Sig  . albuterol (ACCUNEB) 0.63 MG/3ML nebulizer solution Take 1 ampule by nebulization every 6 (six) hours as needed for Wheezing.  Marland Kitchen albuterol (PROVENTIL HFA;VENTOLIN HFA) 108 (90 Base) MCG/ACT inhaler Inhale into the lungs.  Marland Kitchen amiodarone (PACERONE) 400 MG tablet Take 1 tablet (400 mg total) by mouth daily.  Marland Kitchen apixaban (ELIQUIS) 5 MG TABS tablet Take 5 mg by mouth 2 (two) times daily.  Marland Kitchen aspirin 325 MG tablet Take 1 tablet (325 mg total) by mouth daily.  Marland Kitchen atorvastatin (LIPITOR) 40 MG tablet Take  40 mg by mouth daily.  . Budesonide 90 MCG/ACT inhaler Inhale 1 puff into the lungs 2 (two) times daily.  . cholecalciferol (VITAMIN D) 1000 units tablet Take 1,000 Units by mouth daily.  . Cyanocobalamin (VITAMIN B 12 PO) Take by mouth daily.  Marland Kitchen donepezil (ARICEPT) 23 MG TABS tablet Take 23 mg by mouth daily.  . famotidine (PEPCID) 20 MG tablet Take 20 mg by mouth daily.  . Fluticasone-Salmeterol (ADVAIR) 250-50 MCG/DOSE AEPB Inhale 1 puff into the lungs 2 (two) times daily.  . folic acid (FOLVITE) 025 MCG tablet Take 400 mcg by mouth daily.  . furosemide (LASIX) 40 MG tablet Take 1 tablet (40 mg total) by mouth daily.  . hydrALAZINE (APRESOLINE) 100 MG tablet Take 100 mg by mouth 2 (two) times daily.   . IRON PO Take 65 mg by mouth.  . labetalol (NORMODYNE) 200 MG tablet Take 200 mg by mouth 2 (two) times daily.  . memantine (NAMENDA XR) 7 MG CP24 24 hr capsule Take 1 capsule (7 mg total) by mouth daily.  . metoprolol succinate (TOPROL-XL) 25 MG 24 hr tablet Take 1 tablet (25 mg total) by mouth 2 (two) times daily with a meal.  . Multiple Vitamin (MULTIVITAMIN) tablet Take 1 tablet by mouth daily.  . nitroGLYCERIN (NITROSTAT) 0.4 MG SL tablet Place 0.4 mg under the tongue every 5 (five) minutes as needed for chest pain.  . OXYGEN Place 1 L/min into the nose continuous.  Marland Kitchen tiotropium (SPIRIVA HANDIHALER) 18 MCG inhalation capsule Place 18 mcg into inhaler and inhale daily.  . traMADol (ULTRAM) 50 MG tablet Take 1 tablet (50 mg total) by mouth every 6 (six) hours as needed for moderate pain.  . [DISCONTINUED] levalbuterol (XOPENEX) 1.25 MG/3ML nebulizer solution Take 1.25 mg by nebulization as needed for wheezing.   No facility-administered encounter medications on file as of 01/03/2018.      SIGNIFICANT DIAGNOSTIC EXAMS  TODAY:   12-11-17; bilateral carotid doppler: right ICA occlusion. velocities suggest 40-59% left ICA stenosis, status post stenting; however; velocities may be falsely  elevate secondary to contralateral occlusion.   01-25-17: chest x-ray: 1. Right upper lobe pneumonia. 2. Interval small may irregular nodular density at the right lung base, laterally. Attention to this area on follow-up chest radiographs is recommended. 3. Mild changes of COPD and chronic bronchitis with stable left basilar pleural and parenchymal scarring.  12-27-17: 2-d echo: - Since the prior study on 01/18/2016 LVEF has decreased from 50-55% to 40-45% with basal and mid inferolateral and anterolateral  hypokinesis.  12-29-17: ICD implant   12-29-17: cardiac cath: Mid LM lesion is 75% stenosed. Ost LAD to Prox LAD lesion is 100% stenosed. Ost 1st Mrg lesion is 100% stenosed Prox Cx to Mid Cx lesion is 100% stenosed Prox RCA to Mid RCA lesion is 100% stenosed LIMA graft was visualized by angiography and is normal in caliber The graft exhibits no disease Seq SVG- OM 1 and PDA graft was visualized by angiography and is normal in caliber SVG graft was visualized by angiography and is normal in caliber. Origin lesion before 1st Mrg is 50% stenosed LV end diastolic pressure is moderately elevated. 1. Severe 3 vessel occlusive CAD 2. Patent LIMA to the LAD 3. Patent sequential SVG to the first OM and PDA. Patent Y graft SVG to the diagonal 4. Moderately elevated LVEDP of 30 mm Hg.   12-30-17: chest x-ray: Satisfactory post AICD radiograph  LABS REVIEWED; TODAY;  12-25-17: wbc 15.7; hgb 10.3; hct 31.1; mcv 95.7; plt 334; glucose 121; bun 24; create 1.96 ;k+ 4.8; na++ 132; ca 9.9; liver norma albumin 3.7; BNP 325.6; blood culture: no growth; urine strep pneumoniae; Positive 12-26-17: wbc 6.7; hgb 9.4; hct 28.1; mcv 97.6; plt 307; glucose 107; bun 26; creat 1.96; k+ 4.0; na++ 135; ca 9.4; mag 3.7; tsh 1.980; hgb a1c 5.6; chol 146; ldl 81; trig 166; hdl 32; resp. Culture: no growth 12-29-17: wbc 10.3; hgb 9.7; hct 27.9; mcv 98.6; pt 311; glucose 121; bun 36; creat 2.04; k+ 4.4; na++ 136; ca 9.6; mag 2.4; BNP  953.1 01-02-18: glucose 98; bun 37; creat 2.19; k+ 3.6; na++ 138; ca 9.3    Review of Systems  Constitutional: Negative for malaise/fatigue.  Respiratory: Positive for cough and shortness of breath.   Cardiovascular: Negative for chest pain, palpitations and leg swelling.       Has chest sore due to coughing and ICD placement  Gastrointestinal: Negative for abdominal pain, constipation and heartburn.  Musculoskeletal: Negative for back pain, joint pain and myalgias.  Skin: Negative.   Neurological: Negative for dizziness.  Psychiatric/Behavioral: The patient is not nervous/anxious.     Physical Exam  Constitutional: He is oriented to person, place, and time. He appears well-developed and well-nourished. No distress.  Eyes: Conjunctivae are normal.  Neck: Thyromegaly present.  Left side thyroid slightly enlarged   Cardiovascular: Normal rate, regular rhythm and intact distal pulses.  Murmur heard. 2/6  Pulmonary/Chest:  Has increased effort and shortness of breath present; is on 02. Has scattered wheezes throughout.   Abdominal: Soft. Bowel sounds are normal. He exhibits no distension. There is no tenderness.  Musculoskeletal: He exhibits no edema.  Is able to move all extremities   Lymphadenopathy:    He has no cervical adenopathy.  Neurological: He is alert and oriented to person, place, and time.  Skin: Skin is warm and dry. He is not diaphoretic.  Arms with bilateral bruising present   Psychiatric: He has a normal mood and affect.      ASSESSMENT/ PLAN:  TODAY:   1. CAD is stable is status post cabg and MI: no complaint present will continue asa 325 mg daily has prn ntg  2. Hypertensive heart disease: stable b/p 152/60: will continue apresoline 100 mg twice daily labetolol 200 mg twice daily toprol xl 25 mg twice daily   3. Paroxysmal a fib; stable will continue amiodarone 400 mg daily for one month then 200 mg daily; toprol xl 25 mg twice daily labateolol 200 mg twice  daily and eliquis 5 mg twice daily   4. Ischemic cardiomyopathy: stable EF 50-45% (12-27-17): will continue lasix 40 mg daily; will monitor   5. COPD with chronic bronchitis: without change:  will continue budesomide 1 puff twice daily advair 250/50 2 puff twice daily spiriva 18 mcg daily will stop albuterol and and xopenex will begin duoneb every 6 hours for 5 days then every 6 hours as needed  6. CAP: is stable has completed abt; will continue 02 and will wean as appropriate. Will change to duoneb every 6 hours for 5 days then every 6 hours as needed  7. Sleep apnea: stable will continue bipap  8. Dyslipidemia: stable ldl 81 will continue lipitor 40 mg daily   9. Dementia without behavioral disturbance: is stable will continue aricept 23 mg daily and namenda xr 7 mg daily   10. Chronic kidney disease stage 3: stable bun 37 creat 2.19  11. Anemia of chronic disease: stable hgb 9.7; will continue iron daily   12. Cardiac arrest: has pace maker and ICD will monitor   Will have him return to cardiology on 01-09-17  Will check cbc cmp on 01-08-18.   Time spent with patient 50 minutes: 25 minutes spent discussing MOST Form: no tube feeding. Discuss current status; expectations of admission here and what to expect upon discharge. Verbalized understanding.   MOST FULL CODE; HOSPITALIZATION; IVF; ABT; NO TUBE FEEDING   MD is aware of resident's narcotic use and is in agreement with current plan of care. We will attempt to wean resident as apropriate   Ok Edwards NP [facility]  Contact [phone] Monday through Friday 8am- 5pm  After hours call [phone]

## 2018-01-04 ENCOUNTER — Encounter: Payer: Self-pay | Admitting: Internal Medicine

## 2018-01-04 ENCOUNTER — Non-Acute Institutional Stay (SKILLED_NURSING_FACILITY): Payer: PPO | Admitting: Internal Medicine

## 2018-01-04 DIAGNOSIS — R0789 Other chest pain: Secondary | ICD-10-CM | POA: Diagnosis not present

## 2018-01-04 DIAGNOSIS — N183 Chronic kidney disease, stage 3 unspecified: Secondary | ICD-10-CM

## 2018-01-04 DIAGNOSIS — I480 Paroxysmal atrial fibrillation: Secondary | ICD-10-CM | POA: Diagnosis not present

## 2018-01-04 DIAGNOSIS — I25118 Atherosclerotic heart disease of native coronary artery with other forms of angina pectoris: Secondary | ICD-10-CM

## 2018-01-04 DIAGNOSIS — Z95810 Presence of automatic (implantable) cardiac defibrillator: Secondary | ICD-10-CM

## 2018-01-04 DIAGNOSIS — Z8674 Personal history of sudden cardiac arrest: Secondary | ICD-10-CM | POA: Diagnosis not present

## 2018-01-04 DIAGNOSIS — J181 Lobar pneumonia, unspecified organism: Secondary | ICD-10-CM | POA: Diagnosis not present

## 2018-01-04 DIAGNOSIS — F0150 Vascular dementia without behavioral disturbance: Secondary | ICD-10-CM

## 2018-01-04 DIAGNOSIS — J449 Chronic obstructive pulmonary disease, unspecified: Secondary | ICD-10-CM | POA: Diagnosis not present

## 2018-01-04 DIAGNOSIS — Z8673 Personal history of transient ischemic attack (TIA), and cerebral infarction without residual deficits: Secondary | ICD-10-CM | POA: Diagnosis not present

## 2018-01-04 DIAGNOSIS — J189 Pneumonia, unspecified organism: Secondary | ICD-10-CM

## 2018-01-04 NOTE — Progress Notes (Signed)
Patient ID: Joe Wiggins, male   DOB: 23-Aug-1946, 72 y.o.   MRN: [id number]   Provider:  DR Arletha Grippe Location:  [facility] Room Number: [place] of Service:  SNF (31)  PCP: Kathrene Alu, MD Patient Care Team: Kathrene Alu, MD as PCP - General (Family Medicine) Jacolyn Reedy, MD as Consulting Physician (Cardiology) Charlaine Dalton, MD as Referring Physician  Extended Emergency Contact Information Primary Emergency Contact: Wynonia Lawman [place] Phone: [phone] Relation: Spouse  Code Status: Full Code Goals of Care: Advanced Directive information Advanced Directives 01/04/2018  Does Patient Have a Medical Advance Directive? Yes  Type of Advance Directive Out of facility DNR (pink MOST or yellow form)  Does patient want to make changes to medical advance directive? No - Patient declined  Copy of [place] in Chart? -  Would patient like information on creating a medical advance directive? -  Pre-existing out of facility DNR order (yellow form or pink MOST form) Pink MOST form placed in chart (order not valid for inpatient use)      Chief Complaint  Patient presents with  . New Admit To SNF    Admission    HPI: Patient is a 72 y.o. male seen today for admission to SNF following hospital stay for RUL CAP, acute hypoxia, irregularity RLL nodule, hyponatremia, CP, vascular dementia. CXR (+) RUL infiltrate. He had SVT -> cardiac arrest on 12/26/17 2/2 VF. 14 min resuscitation given ROSC. Cardio consulted. Cardiac cath on 12/29/17 revealed patent grafts; ICD placed. He was on amiodarone gtt during admission.2D echo showed EF 40-45%; grade 2 DD; mild concentric LVH. He presents to SNF for short term rehab.  Today he reports feeling ok. He had severe CP yesterday after PT but no pain today. No N/V. No SOB. Cough is productive with white sputum. No f/c. He wears [place] O2 ATC. He had several loose BMs  last night but none today. Tolerating PT. Prior to hospital admission he was living alone independently. Daughter present. He is a poor historian due to dementia. Hx obtained from chart. SBP 158 today; O2 sat 97% on  O2  CAD - s/p CABG; recent MI. Takes ASA 325 mg daily; prn ntg. No further CP. EF 45-50%  HTN - BP elevated on apresoline 100 mg twice daily; labetolol 200 mg twice daily; toprol xl 25 mg twice daily   PAF - rate controlled on amiodarone 400 mg daily for one month then 200 mg daily; toprol xl 25 mg twice daily; labateolol 200 mg twice daily; he takes eliquis 5 mg twice daily for anticoagulation  ICM - stable. 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 LDL 81   Dementia without behavioral disturbance - stable on aricept 23 mg daily and namenda xr 7 mg daily   CKD - stage 3. Cr 2.19  Anemia of chronic disease - stable on iron daily; Hgb 9.7     Past Medical History:  Diagnosis Date  . AF (paroxysmal atrial fibrillation) ([place])   . CAD (coronary artery disease)   . Cardiomyopathy, ischemic   . Carotid artery occlusion   .  Chronic kidney disease   . COPD (chronic obstructive pulmonary disease) ([place])   . Dementia without behavioral disturbance   . Hyperlipidemia   . Hypertension   . Sleep apnea   . Stroke ([place])   . TIA (transient ischemic attack)    Past Surgical History:  Procedure Laterality Date  . CARDIAC PACEMAKER PLACEMENT    . CATARACT EXTRACTION    . CORONARY ARTERY BYPASS GRAFT    . CORONARY STENT PLACEMENT    . EYE SURGERY    . ICD IMPLANT N/A 12/29/2017   Procedure: ICD IMPLANT;  Surgeon: Evans Lance, MD;  Location: [facility];  Service: Cardiovascular;  Laterality: N/A;  .  LEFT HEART CATH AND CORS/GRAFTS ANGIOGRAPHY N/A 12/29/2017   Procedure: LEFT HEART CATH AND CORS/GRAFTS ANGIOGRAPHY;  Surgeon: Martinique, Peter M, MD;  Location: [facility];  Service: Cardiovascular;  Laterality: N/A;    reports that he quit smoking about 5 years ago. he has never used smokeless tobacco. He reports that he does not drink alcohol or use drugs. Social History   Socioeconomic History  . Marital status: Married    Spouse name: Not on file  . Number of children: Not on file  . Years of education: Not on file  . Highest education level: Not on file  Social Needs  . Financial resource strain: Not on file  . Food insecurity - worry: Not on file  . Food insecurity - inability: Not on file  . Transportation needs - medical: Not on file  . Transportation needs - non-medical: Not on file  Occupational History  . Not on file  Tobacco Use  . Smoking status: Former Smoker    Last attempt to quit: 11/30/2012    Years since quitting: 5.0  . Smokeless tobacco: Never Used  Substance and Sexual Activity  . Alcohol use: No  . Drug use: No  . Sexual activity: Not on file  Other Topics Concern  . Not on file  Social History Narrative  . Not on file    Functional Status Survey:    Family History  Problem Relation Age of Onset  . Hypertension Mother   . Heart disease Mother   . Hypertension Sister   . Heart disease Sister     Health Maintenance  Topic Date Due  . COLONOSCOPY  01/03/2019 (Originally 05/06/1995)  . TETANUS/TDAP  01/03/2019 (Originally 05/05/1964)  . Hepatitis C Screening  01/03/2019 (Originally 18-Oct-1946)  . INFLUENZA VACCINE  Completed  . PNA vac Low Risk Adult  Completed    Allergies  Allergen Reactions  . Ipratropium Other (See Comments)  . Morphine And Related Nausea Only  . Vicodin [Hydrocodone-Acetaminophen] Other (See Comments)    Hallucinations   . Penicillins Rash    Outpatient Encounter Medications as of 01/04/2018  Medication Sig  .  albuterol (PROVENTIL HFA;VENTOLIN HFA) 108 (90 Base) MCG/ACT inhaler Inhale 2 puffs into the lungs every 6 (six) hours as needed.   Marland Kitchen amiodarone (PACERONE) 400 MG tablet Take 1 tablet (400 mg total) by mouth daily.  Marland Kitchen apixaban (ELIQUIS) 5 MG TABS tablet Take 5 mg by mouth 2 (two) times daily.  Marland Kitchen aspirin 325 MG tablet Take 1 tablet (325 mg total) by mouth daily.  Marland Kitchen atorvastatin (LIPITOR) 40 MG tablet Take 40 mg by mouth daily.  . Budesonide 90 MCG/ACT inhaler Inhale 1 puff into the lungs 2 (two) times daily.  . cholecalciferol (VITAMIN D) 1000 units tablet Take 1,000 Units by  mouth daily.  . Cyanocobalamin (VITAMIN B 12 PO) Take by mouth daily.  Marland Kitchen donepezil (ARICEPT) 23 MG TABS tablet Take 23 mg by mouth daily.  . famotidine (PEPCID) 20 MG tablet Take 20 mg by mouth daily.  . ferrous sulfate 325 (65 FE) MG tablet Take 325 mg by mouth daily with breakfast.  . Fluticasone-Salmeterol (ADVAIR) 250-50 MCG/DOSE AEPB Inhale 1 puff into the lungs 2 (two) times daily.  . folic acid (FOLVITE) 606 MCG tablet Take 400 mcg by mouth daily.  . furosemide (LASIX) 40 MG tablet Take 1 tablet (40 mg total) by mouth daily.  . hydrALAZINE (APRESOLINE) 100 MG tablet Take 100 mg by mouth 2 (two) times daily.   Marland Kitchen ipratropium-albuterol (DUONEB) 0.5-2.5 (3) MG/3ML SOLN Take 3 mLs by nebulization every 6 (six) hours. X 1 week then every 6 hours as needed  . labetalol (NORMODYNE) 200 MG tablet Take 200 mg by mouth 2 (two) times daily.  . memantine (NAMENDA XR) 7 MG CP24 24 hr capsule Take 1 capsule (7 mg total) by mouth daily.  . metoprolol succinate (TOPROL-XL) 25 MG 24 hr tablet Take 1 tablet (25 mg total) by mouth 2 (two) times daily with a meal.  . Multiple Vitamin (MULTIVITAMIN) tablet Take 1 tablet by mouth daily.  . nitroGLYCERIN (NITROSTAT) 0.4 MG SL tablet Place 0.4 mg under the tongue every 5 (five) minutes as needed for chest pain.  . OXYGEN Place 1 L/min into the nose continuous.  Marland Kitchen tiotropium (SPIRIVA  HANDIHALER) 18 MCG inhalation capsule Place 18 mcg into inhaler and inhale daily.  . traMADol (ULTRAM) 50 MG tablet Take 1 tablet (50 mg total) by mouth every 6 (six) hours as needed for moderate pain.  . [DISCONTINUED] albuterol (ACCUNEB) 0.63 MG/3ML nebulizer solution Take 1 ampule by nebulization every 6 (six) hours as needed for Wheezing.  . [DISCONTINUED] IRON PO Take 65 mg by mouth.   No facility-administered encounter medications on file as of 01/04/2018.     Review of Systems  Unable to perform ROS: Dementia    Vitals:   01/04/18 1123  BP: (!) 154/74  Pulse: 69  Resp: 17  Temp: 97.8 F (36.6 C)  SpO2: 94%  Weight: 154 lb 6.4 oz (70 kg)  Height: 5\' 8"  (1.727 m)   Body mass index is 23.48 kg/m. Physical Exam  Constitutional: He appears well-developed and well-nourished.  Sitting up in bed in NAD, [place] O2 intact, no conversational dyspnea.  HENT:  Mouth/Throat: Oropharynx is clear and moist.  MMM; no oral thrush  Eyes: Pupils are equal, round, and reactive to light. No scleral icterus.  Neck: Neck supple. Carotid bruit is not present. No thyromegaly present.  Cardiovascular: Normal rate and intact distal pulses. An irregular rhythm present. Exam reveals no gallop and no friction rub.  Murmur (1/6 SEM) heard. No distal LE edema. No calf TTP  Pulmonary/Chest: Effort normal. He has wheezes (right end expiratory wheezing with prolonged expiratory phaser). He has no rales. He exhibits tenderness (sternal).  Left ACW AICD intact  Abdominal: Soft. Normal appearance and bowel sounds are normal. He exhibits no distension, no abdominal bruit, no pulsatile midline mass and no mass. There is no hepatomegaly. There is no tenderness. There is no rigidity, no rebound and no guarding. No hernia.  obese  Musculoskeletal: He exhibits edema.  Lymphadenopathy:    He has no cervical adenopathy.  Neurological: He is alert.  Skin: Skin is warm and dry. No rash noted.  Left flank purplish  contusion  Psychiatric: He has a normal mood and affect. His behavior is normal. Thought content normal.  Intermittently confused    Labs reviewed: Basic Metabolic Panel: Recent Labs    12/30/17 0339 12/31/17 0238 01/01/18 0249 01/02/18 0241  NA 137 134* 137 138  K 3.8 3.5 3.9 3.6  CL 104 103 107 105  CO2 22 22 22  21*  GLUCOSE 117* 111* 104* 98  BUN 36* 35* 34* 37*  CREATININE 1.97* 2.13* 2.00* 2.19*  CALCIUM 9.5 9.6 9.4 9.3  MG 2.2 2.1 2.1  --   PHOS  --  3.7 3.6  --    Liver Function Tests: Recent Labs    12/25/17 1113  AST 24  ALT 16*  ALKPHOS 120  BILITOT 0.8  PROT 7.1  ALBUMIN 3.7   No results for input(s): LIPASE, AMYLASE in the last 8760 hours. No results for input(s): AMMONIA in the last 8760 hours. CBC: Recent Labs    12/25/17 1113  12/30/17 0339 12/31/17 0238 01/01/18 0249  WBC 15.7*   < > 8.2 8.4 7.6  NEUTROABS 13.8*  --   --  6.3 5.4  HGB 10.3*   < > 9.0* 8.8* 8.3*  HCT 31.1*   < > 28.0* 26.7* 24.3*  MCV 95.7   < > 96.9 97.4 99.6  PLT 334   < > 278 259 238   < > = values in this interval not displayed.   Cardiac Enzymes: Recent Labs    12/28/17 2033 12/29/17 0101 12/29/17 0749  TROPONINI 1.99* 3.73* 3.81*   BNP: Invalid input(s): POCBNP Lab Results  Component Value Date   HGBA1C 5.6 12/26/2017   Lab Results  Component Value Date   TSH 1.980 12/26/2017   No results found for: VITAMINB12 No results found for: FOLATE No results found for: IRON, TIBC, FERRITIN  Imaging and Procedures obtained prior to SNF admission: Dg Chest 2 View  Result Date: 12/25/2017 CLINICAL DATA:  Shortness of breath for the past 2 days. Productive cough. Ex-smoker. EXAM: CHEST  2 VIEW COMPARISON:  01/17/2016. FINDINGS: Borderline enlarged cardiac silhouette. Stable post CABG changes. Stable left subclavian bipolar pacemaker leads. No significant change in probable pleural and parenchymal scarring at the left lung base, laterally. Interval patchy opacity in  the right upper lobe. Diffuse peribronchial thickening and accentuation of the interstitial markings with mild hyperexpansion of the lungs.a there is also an interval small irregular nodular density at the right lateral lung base. Thoracic spine degenerative changes. IMPRESSION: 1. Right upper lobe pneumonia. 2. Interval small may irregular nodular density at the right lung base, laterally. Attention to this area on follow-up chest radiographs is recommended. 3. Mild changes of COPD and chronic bronchitis with stable left basilar pleural and parenchymal scarring. Electronically Signed   By: Claudie Revering M.D.   On: 12/25/2017 11:45   Dg Chest Port 1 View  Result Date: 12/26/2017 CLINICAL DATA:  Cardiac arrest, intubated EXAM: PORTABLE CHEST 1 VIEW COMPARISON:  Chest radiograph from one day prior. FINDINGS: Endotracheal tube tip is 4.7 cm above the carina. Pacer pads overlie the left chest. Intact sternotomy wires. CABG clips overlie the mediastinum. Stable cardiomediastinal silhouette with normal heart size. No pneumothorax. No right pleural effusion. Stable small left pleural effusion. Worsening diffuse patchy parahilar lung opacities. IMPRESSION: 1. Well-positioned endotracheal tube. 2. Worsening diffuse patchy parahilar lung opacities, probably pulmonary edema, with a component of aspiration/pneumonia not excluded. 3. Stable small left pleural effusion. Electronically Signed   By: Rinaldo Ratel  Poff M.D.   On: 12/26/2017 08:59   Dg Abd Portable 1v  Result Date: 12/26/2017 CLINICAL DATA:  Orogastric tube placement EXAM: PORTABLE ABDOMEN - 1 VIEW COMPARISON:  None. FINDINGS: Gastric tube enters the stomach with the tip near the duodenal bulb. Nonobstructive bowel gas pattern. Bilateral airspace disease and small left effusion IMPRESSION: Gastric tube in the region of the duodenal bulb. Nonobstructive bowel gas pattern. Electronically Signed   By: Franchot Gallo M.D.   On: 12/26/2017 11:20    Assessment/Plan    ICD-10-CM   1. Musculoskeletal chest pain R07.89   2. History of cardiac arrest Z86.74    2/2 ventricular fibrillation  3. Community acquired pneumonia of right upper lobe of lung ([place]) J18.1   4. ICD (implantable cardioverter-defibrillator) in place Z95.810   5. Paroxysmal atrial fibrillation (H[facility]) I48.0   6. Chronic kidney disease, stage 3 (H[facility]) N18.3   7. Vascular dementia without behavioral disturbance F01.50   8. Chronic obstructive pulmonary disease, unspecified COPD type ([place]) J44.9   9. Coronary artery disease of native artery of native heart with stable angina pectoris ([place]) I25.118   10. History of CVA (cerebrovascular accident) Z63.73     Cont current meds as ordered. BP meds recently adjusted.  Cont PT/OT/ST as ordered  F/u with specialists as scheduled  Cont nutritional supplements as indicated  GOAL: short term rehab and d/c home when medically appropriate. Communicated with pt and nursing.  Will follow  Labs/tests ordered: cbc, cmp     Phill Steck S. Perlie Gold  [facility] [address] [place], [place] [address] [phone] Cell (Monday-Friday 8 AM - 5 PM) [phone] After 5 PM and follow prompts

## 2018-01-05 DIAGNOSIS — R079 Chest pain, unspecified: Secondary | ICD-10-CM | POA: Diagnosis not present

## 2018-01-05 DIAGNOSIS — M6281 Muscle weakness (generalized): Secondary | ICD-10-CM | POA: Diagnosis not present

## 2018-01-09 ENCOUNTER — Ambulatory Visit (INDEPENDENT_AMBULATORY_CARE_PROVIDER_SITE_OTHER): Payer: PPO | Admitting: *Deleted

## 2018-01-09 DIAGNOSIS — M6281 Muscle weakness (generalized): Secondary | ICD-10-CM | POA: Diagnosis not present

## 2018-01-09 DIAGNOSIS — I469 Cardiac arrest, cause unspecified: Secondary | ICD-10-CM | POA: Diagnosis not present

## 2018-01-09 DIAGNOSIS — R079 Chest pain, unspecified: Secondary | ICD-10-CM | POA: Diagnosis not present

## 2018-01-09 DIAGNOSIS — Z95810 Presence of automatic (implantable) cardiac defibrillator: Secondary | ICD-10-CM | POA: Diagnosis not present

## 2018-01-09 LAB — CUP PACEART INCLINIC DEVICE CHECK
Battery Remaining Longevity: 100 mo
Brady Statistic RA Percent Paced: 0.03 %
Brady Statistic RV Percent Paced: 0 %
Date Time Interrogation Session: 20190115150002
HighPow Impedance: 60.75 Ohm
Implantable Lead Implant Date: 20140217
Implantable Lead Implant Date: 20190104
Implantable Lead Location: 753859
Implantable Lead Location: 753860
Implantable Lead Model: 7122
Implantable Pulse Generator Implant Date: 20190104
Lead Channel Impedance Value: 325 Ohm
Lead Channel Impedance Value: 787.5 Ohm
Lead Channel Pacing Threshold Amplitude: 0.5 V
Lead Channel Pacing Threshold Amplitude: 0.5 V
Lead Channel Pacing Threshold Amplitude: 1.25 V
Lead Channel Pacing Threshold Amplitude: 1.25 V
Lead Channel Pacing Threshold Pulse Width: 0.5 ms
Lead Channel Pacing Threshold Pulse Width: 0.5 ms
Lead Channel Pacing Threshold Pulse Width: 0.5 ms
Lead Channel Pacing Threshold Pulse Width: 0.5 ms
Lead Channel Sensing Intrinsic Amplitude: 0.8 mV
Lead Channel Sensing Intrinsic Amplitude: 12 mV
Lead Channel Setting Pacing Amplitude: 2.5 V
Lead Channel Setting Pacing Amplitude: 3.5 V
Lead Channel Setting Pacing Pulse Width: 0.5 ms
Lead Channel Setting Sensing Sensitivity: 0.5 mV
Pulse Gen Serial Number: 9777027

## 2018-01-09 NOTE — Progress Notes (Signed)
Wound check appointment. Steri-strips removed. Wound without redness or edema. Incision edges approximated, wound well healed. Normal device function. Thresholds, sensing, and impedances consistent with implant measurements. Device programmed with RV output at 3.5V for extra safety margin until 3 month visit. Histogram distribution appropriate for patient and level of activity. No mode switches or ventricular arrhythmias noted. Patient educated about wound care, arm mobility, lifting restrictions, shock plan and Merlin monitoring. ROV with GT 04/03/18.

## 2018-01-10 ENCOUNTER — Encounter: Payer: Self-pay | Admitting: Adult Health

## 2018-01-10 ENCOUNTER — Non-Acute Institutional Stay (SKILLED_NURSING_FACILITY): Payer: PPO | Admitting: Adult Health

## 2018-01-10 DIAGNOSIS — R55 Syncope and collapse: Secondary | ICD-10-CM

## 2018-01-10 LAB — BASIC METABOLIC PANEL
BUN: 17 (ref 4–21)
Creatinine: 2.1 — AB (ref 0.6–1.3)
Glucose: 127
Potassium: 4.7 (ref 3.4–5.3)
Sodium: 141 (ref 137–147)

## 2018-01-10 LAB — CBC AND DIFFERENTIAL
HCT: 28 — AB (ref 41–53)
Hemoglobin: 9.9 — AB (ref 13.5–17.5)
Neutrophils Absolute: 5
Platelets: 335 (ref 150–399)
WBC: 6.6

## 2018-01-10 LAB — HEPATIC FUNCTION PANEL
ALT: 15 (ref 10–40)
AST: 21 (ref 14–40)
Alkaline Phosphatase: 218 — AB (ref 25–125)
Bilirubin, Total: 0.3

## 2018-01-10 NOTE — Progress Notes (Signed)
Location:   [facility] Room Number: [place] of Service:  SNF (31)   CODE STATUS: Full Code  Allergies  Allergen Reactions  . Ipratropium Other (See Comments)  . Morphine And Related Nausea Only  . Vicodin [Hydrocodone-Acetaminophen] Other (See Comments)    Hallucinations   . Penicillins Rash    Chief Complaint  Patient presents with  . Acute Visit    Syncope    HPI:  He was on the toilet when he had a syncopal episode. He was not trying to have a bowel movement. He was not diaphoretic he did awaken quickly. He is orthostatic; has poor skin turgor; has had poor po intake. He is unable to fully participate in the hpi or ros; but did tell me that he is feeling bad.    Past Medical History:  Diagnosis Date  . AF (paroxysmal atrial fibrillation) ([place])   . CAD (coronary artery disease)   . Cardiomyopathy, ischemic   . Carotid artery occlusion   . Chronic kidney disease   . COPD (chronic obstructive pulmonary disease) ([place])   . Dementia without behavioral disturbance   . Hyperlipidemia   . Hypertension   . Sleep apnea   . Stroke ([place])   . TIA (transient ischemic attack)     Past Surgical History:  Procedure Laterality Date  . CARDIAC PACEMAKER PLACEMENT    . CATARACT EXTRACTION    . CORONARY ARTERY BYPASS GRAFT    . CORONARY STENT PLACEMENT    . EYE SURGERY    . ICD IMPLANT N/A 12/29/2017   Procedure: ICD IMPLANT;  Surgeon: Evans Lance, MD;  Location: [facility];  Service: Cardiovascular;  Laterality: N/A;  . LEFT HEART CATH AND CORS/GRAFTS ANGIOGRAPHY N/A 12/29/2017   Procedure: LEFT HEART CATH AND CORS/GRAFTS ANGIOGRAPHY;  Surgeon: Martinique, Peter M, MD;  Location: [facility];  Service: Cardiovascular;  Laterality: N/A;    Social History   Socioeconomic History  . Marital status: Married    Spouse name: Not on file  . Number of children: Not on file  . Years of education: Not on file  . Highest education level: Not on file    Social Needs  . Financial resource strain: Not on file  . Food insecurity - worry: Not on file  . Food insecurity - inability: Not on file  . Transportation needs - medical: Not on file  . Transportation needs - non-medical: Not on file  Occupational History  . Not on file  Tobacco Use  . Smoking status: Former Smoker    Last attempt to quit: 11/30/2012    Years since quitting: 5.1  . Smokeless tobacco: Never Used  Substance and Sexual Activity  . Alcohol use: No  . Drug use: No  . Sexual activity: Not on file  Other Topics Concern  . Not on file  Social History Narrative  . Not on file   Family History  Problem Relation Age of Onset  . Hypertension Mother   . Heart disease Mother   . Hypertension Sister   . Heart disease Sister       VITAL SIGNS BP (!) 150/78   Pulse 61   Temp (!) 97 F (36.1 C)   Resp 18   Ht 5\' 8"  (1.727 m)   Wt 154 lb 6.4 oz (70 kg)   SpO2 98%   BMI 23.48 kg/m   Outpatient Encounter Medications as of 01/10/2018  Medication Sig  . acetaminophen (  TYLENOL) 325 MG tablet Take 650 mg by mouth 3 (three) times daily.  Marland Kitchen albuterol (ACCUNEB) 0.63 MG/3ML nebulizer solution 3 ml inhale orally via nebulizer four times a day  . albuterol (PROVENTIL HFA;VENTOLIN HFA) 108 (90 Base) MCG/ACT inhaler Inhale 2 puffs into the lungs every 6 (six) hours as needed.   Marland Kitchen amiodarone (PACERONE) 400 MG tablet Take 1 tablet (400 mg total) by mouth daily.  Marland Kitchen apixaban (ELIQUIS) 5 MG TABS tablet Take 5 mg by mouth 2 (two) times daily.  Marland Kitchen aspirin 325 MG tablet Take 1 tablet (325 mg total) by mouth daily.  Marland Kitchen atorvastatin (LIPITOR) 40 MG tablet Take 40 mg by mouth daily.  . Budesonide 90 MCG/ACT inhaler Inhale 1 puff into the lungs 2 (two) times daily.  . cholecalciferol (VITAMIN D) 1000 units tablet Take 1,000 Units by mouth daily.  . Cyanocobalamin (VITAMIN B 12 PO) Take by mouth daily.  Marland Kitchen donepezil (ARICEPT) 23 MG TABS tablet Take 23 mg by mouth daily.  . famotidine  (PEPCID) 20 MG tablet Take 20 mg by mouth daily.  . ferrous sulfate 325 (65 FE) MG tablet Take 325 mg by mouth daily with breakfast.  . Fluticasone-Salmeterol (ADVAIR) 250-50 MCG/DOSE AEPB Inhale 1 puff into the lungs 2 (two) times daily.  . folic acid (FOLVITE) 409 MCG tablet Take 400 mcg by mouth daily.  . furosemide (LASIX) 40 MG tablet Take 1 tablet (40 mg total) by mouth daily.  . hydrALAZINE (APRESOLINE) 100 MG tablet Take 100 mg by mouth 2 (two) times daily.   Marland Kitchen labetalol (NORMODYNE) 200 MG tablet Take 200 mg by mouth 2 (two) times daily.  . memantine (NAMENDA XR) 7 MG CP24 24 hr capsule Take 1 capsule (7 mg total) by mouth daily.  . metoprolol succinate (TOPROL-XL) 25 MG 24 hr tablet Take 1 tablet (25 mg total) by mouth 2 (two) times daily with a meal.  . Multiple Vitamin (MULTIVITAMIN) tablet Take 1 tablet by mouth daily.  . nitroGLYCERIN (NITROSTAT) 0.4 MG SL tablet Place 0.4 mg under the tongue every 5 (five) minutes as needed for chest pain.  . OXYGEN Place 1 L/min into the nose continuous.  Marland Kitchen tiotropium (SPIRIVA HANDIHALER) 18 MCG inhalation capsule Place 18 mcg into inhaler and inhale daily.  . traMADol (ULTRAM) 50 MG tablet Take 1 tablet (50 mg total) by mouth every 6 (six) hours as needed for moderate pain.  . [DISCONTINUED] ipratropium-albuterol (DUONEB) 0.5-2.5 (3) MG/3ML SOLN Take 3 mLs by nebulization every 6 (six) hours. X 1 week then every 6 hours as needed   No facility-administered encounter medications on file as of 01/10/2018.      SIGNIFICANT DIAGNOSTIC EXAMS  PREVIOUS:   12-11-17; bilateral carotid doppler: right ICA occlusion. velocities suggest 40-59% left ICA stenosis, status post stenting; however; velocities may be falsely elevate secondary to contralateral occlusion.   01-25-17: chest x-ray: 1. Right upper lobe pneumonia. 2. Interval small may irregular nodular density at the right lung base, laterally. Attention to this area on follow-up chest radiographs  is recommended. 3. Mild changes of COPD and chronic bronchitis with stable left basilar pleural and parenchymal scarring.  12-27-17: 2-d echo: - Since the prior study on 01/18/2016 LVEF has decreased from 50-55% to 40-45% with basal and mid inferolateral and anterolateral hypokinesis.  12-29-17: ICD implant   12-29-17: cardiac cath: Mid LM lesion is 75% stenosed. Ost LAD to Prox LAD lesion is 100% stenosed. Ost 1st Mrg lesion is 100% stenosed Prox Cx to Mid  Cx lesion is 100% stenosed Prox RCA to Mid RCA lesion is 100% stenosed LIMA graft was visualized by angiography and is normal in caliber The graft exhibits no disease Seq SVG- OM 1 and PDA graft was visualized by angiography and is normal in caliber SVG graft was visualized by angiography and is normal in caliber. Origin lesion before 1st Mrg is 50% stenosed LV end diastolic pressure is moderately elevated. 1. Severe 3 vessel occlusive CAD 2. Patent LIMA to the LAD 3. Patent sequential SVG to the first OM and PDA. Patent Y graft SVG to the diagonal 4. Moderately elevated LVEDP of 30 mm Hg.   12-30-17: chest x-ray: Satisfactory post AICD radiograph  NO NEW EXAMS   LABS REVIEWED; PREVIOUS;  12-25-17: wbc 15.7; hgb 10.3; hct 31.1; mcv 95.7; plt 334; glucose 121; bun 24; create 1.96 ;k+ 4.8; na++ 132; ca 9.9; liver norma albumin 3.7; BNP 325.6; blood culture: no growth; urine strep pneumoniae; Positive 12-26-17: wbc 6.7; hgb 9.4; hct 28.1; mcv 97.6; plt 307; glucose 107; bun 26; creat 1.96; k+ 4.0; na++ 135; ca 9.4; mag 3.7; tsh 1.980; hgb a1c 5.6; chol 146; ldl 81; trig 166; hdl 32; resp. Culture: no growth 12-29-17: wbc 10.3; hgb 9.7; hct 27.9; mcv 98.6; pt 311; glucose 121; bun 36; creat 2.04; k+ 4.4; na++ 136; ca 9.6; mag 2.4; BNP 953.1 01-02-18: glucose 98; bun 37; creat 2.19; k+ 3.6; na++ 138; ca 9.3   NO NEW LABS   Review of Systems  Unable to perform ROS: Other (is confused )    Physical Exam  Constitutional: He appears well-developed and  well-nourished. No distress.  Eyes: Pupils are equal, round, and reactive to light.  Neck: Neck supple. Thyromegaly present.  Left thyroid slightly enlarged   Cardiovascular: Normal rate, regular rhythm and intact distal pulses.  Murmur heard. Has ICD on chest wall 2/6  Pulmonary/Chest: Effort normal and breath sounds normal. No respiratory distress.  Abdominal: Soft. Bowel sounds are normal. He exhibits no distension.  Musculoskeletal: He exhibits no edema.  Able to move all extremities   Lymphadenopathy:    He has no cervical adenopathy.  Neurological: He is alert.  Skin: Skin is warm and dry. He is not diaphoretic.  Bruising present on both arms   Psychiatric: He has a normal mood and affect.      ASSESSMENT/ PLAN:  TODAY:   1.pre-syncope: will give him 2 liters NS at 75 cc per hour Will begin orthostatic vital signs daily for 7 days Will get cbc; cmp; ekg and chest x-ray.  MD is aware of resident's narcotic use and is in agreement with current plan of care. We will attempt to wean resident as apropriate     Ok Edwards NP [facility]  Contact [phone] Monday through Friday 8am- 5pm  After hours call [phone]

## 2018-01-12 ENCOUNTER — Encounter: Payer: Self-pay | Admitting: Adult Health

## 2018-01-12 ENCOUNTER — Non-Acute Institutional Stay (SKILLED_NURSING_FACILITY): Payer: PPO | Admitting: Adult Health

## 2018-01-12 DIAGNOSIS — R55 Syncope and collapse: Secondary | ICD-10-CM

## 2018-01-12 NOTE — Progress Notes (Signed)
Location:   [place] Room Number: [place] of Service:  SNF (31)   CODE STATUS: Full Code  Allergies  Allergen Reactions  . Ipratropium Other (See Comments)  . Morphine And Related Nausea Only  . Vicodin [Hydrocodone-Acetaminophen] Other (See Comments)    Hallucinations   . Penicillins Rash    Chief Complaint  Patient presents with  . Acute Visit    Follow up Syncope    HPI:  He has not had any further syncopal episodes. He is no longer orthostatic. He tells me that he is feeling good. He denies chest pain; shortness of breath. His food intake remains poor. There are no reports of fevers present. He continues to participate in therapy.   Past Medical History:  Diagnosis Date  . AF (paroxysmal atrial fibrillation) ([place])   . CAD (coronary artery disease)   . Cardiomyopathy, ischemic   . Carotid artery occlusion   . Chronic kidney disease   . COPD (chronic obstructive pulmonary disease) ([place])   . Dementia without behavioral disturbance   . Hyperlipidemia   . Hypertension   . Sleep apnea   . Stroke ([place])   . TIA (transient ischemic attack)     Past Surgical History:  Procedure Laterality Date  . CARDIAC PACEMAKER PLACEMENT    . CATARACT EXTRACTION    . CORONARY ARTERY BYPASS GRAFT    . CORONARY STENT PLACEMENT    . EYE SURGERY    . ICD IMPLANT N/A 12/29/2017   Procedure: ICD IMPLANT;  Surgeon: Evans Lance, MD;  Location: [facility];  Service: Cardiovascular;  Laterality: N/A;  . LEFT HEART CATH AND CORS/GRAFTS ANGIOGRAPHY N/A 12/29/2017   Procedure: LEFT HEART CATH AND CORS/GRAFTS ANGIOGRAPHY;  Surgeon: Martinique, Peter M, MD;  Location: [facility];  Service: Cardiovascular;  Laterality: N/A;    Social History   Socioeconomic History  . Marital status: Married    Spouse name: Not on file  . Number of children: Not on file  . Years of education: Not on file  . Highest education level: Not on file  Social Needs  . Financial  resource strain: Not on file  . Food insecurity - worry: Not on file  . Food insecurity - inability: Not on file  . Transportation needs - medical: Not on file  . Transportation needs - non-medical: Not on file  Occupational History  . Not on file  Tobacco Use  . Smoking status: Former Smoker    Last attempt to quit: 11/30/2012    Years since quitting: 5.1  . Smokeless tobacco: Never Used  Substance and Sexual Activity  . Alcohol use: No  . Drug use: No  . Sexual activity: Not on file  Other Topics Concern  . Not on file  Social History Narrative  . Not on file   Family History  Problem Relation Age of Onset  . Hypertension Mother   . Heart disease Mother   . Hypertension Sister   . Heart disease Sister       VITAL SIGNS BP 138/80   Pulse 80   Temp (!) 97.3 F (36.3 C)   Resp 18   Ht '5\' 8"'  (1.727 m)   Wt 154 lb 6.4 oz (70 kg)   SpO2 91%   BMI 23.48 kg/m    Outpatient Encounter Medications as of 01/12/2018  Medication Sig  . acetaminophen (TYLENOL) 325 MG tablet Take 650 mg by mouth 3 (three) times daily.  Marland Kitchen  albuterol (ACCUNEB) 0.63 MG/3ML nebulizer solution 3 ml inhale orally via nebulizer four times a day  . albuterol (PROVENTIL HFA;VENTOLIN HFA) 108 (90 Base) MCG/ACT inhaler Inhale 2 puffs into the lungs every 6 (six) hours as needed.   Marland Kitchen amiodarone (PACERONE) 400 MG tablet Take 1 tablet (400 mg total) by mouth daily.  Marland Kitchen apixaban (ELIQUIS) 5 MG TABS tablet Take 5 mg by mouth 2 (two) times daily.  Marland Kitchen aspirin 325 MG tablet Take 1 tablet (325 mg total) by mouth daily.  Marland Kitchen atorvastatin (LIPITOR) 40 MG tablet Take 40 mg by mouth daily.  . Budesonide 90 MCG/ACT inhaler Inhale 1 puff into the lungs 2 (two) times daily.  . cholecalciferol (VITAMIN D) 1000 units tablet Take 1,000 Units by mouth daily.  . Cyanocobalamin (VITAMIN B 12 PO) Take by mouth daily.  Marland Kitchen donepezil (ARICEPT) 23 MG TABS tablet Take 23 mg by mouth daily.  . famotidine (PEPCID) 20 MG tablet Take 20 mg  by mouth daily.  . ferrous sulfate 325 (65 FE) MG tablet Take 325 mg by mouth daily with breakfast.  . Fluticasone-Salmeterol (ADVAIR) 250-50 MCG/DOSE AEPB Inhale 1 puff into the lungs 2 (two) times daily.  . folic acid (FOLVITE) 505 MCG tablet Take 400 mcg by mouth daily.  . furosemide (LASIX) 40 MG tablet Take 1 tablet (40 mg total) by mouth daily.  . hydrALAZINE (APRESOLINE) 100 MG tablet Take 100 mg by mouth 2 (two) times daily.   Marland Kitchen labetalol (NORMODYNE) 200 MG tablet Take 200 mg by mouth 2 (two) times daily.  . memantine (NAMENDA XR) 7 MG CP24 24 hr capsule Take 1 capsule (7 mg total) by mouth daily.  . metoprolol succinate (TOPROL-XL) 25 MG 24 hr tablet Take 1 tablet (25 mg total) by mouth 2 (two) times daily with a meal.  . Multiple Vitamin (MULTIVITAMIN) tablet Take 1 tablet by mouth daily.  . nitroGLYCERIN (NITROSTAT) 0.4 MG SL tablet Place 0.4 mg under the tongue every 5 (five) minutes as needed for chest pain.  . OXYGEN Place 1 L/min into the nose continuous.  Marland Kitchen tiotropium (SPIRIVA HANDIHALER) 18 MCG inhalation capsule Place 18 mcg into inhaler and inhale daily.  . traMADol (ULTRAM) 50 MG tablet Take 1 tablet (50 mg total) by mouth every 6 (six) hours as needed for moderate pain.   No facility-administered encounter medications on file as of 01/12/2018.      SIGNIFICANT DIAGNOSTIC EXAMS   PREVIOUS:   12-11-17; bilateral carotid doppler: right ICA occlusion. velocities suggest 40-59% left ICA stenosis, status post stenting; however; velocities may be falsely elevate secondary to contralateral occlusion.   01-25-17: chest x-ray: 1. Right upper lobe pneumonia. 2. Interval small may irregular nodular density at the right lung base, laterally. Attention to this area on follow-up chest radiographs is recommended. 3. Mild changes of COPD and chronic bronchitis with stable left basilar pleural and parenchymal scarring.  12-27-17: 2-d echo: - Since the prior study on 01/18/2016 LVEF has  decreased from 50-55% to 40-45% with basal and mid inferolateral and anterolateral hypokinesis.  12-29-17: ICD implant   12-29-17: cardiac cath: Mid LM lesion is 75% stenosed. Ost LAD to Prox LAD lesion is 100% stenosed. Ost 1st Mrg lesion is 100% stenosed Prox Cx to Mid Cx lesion is 100% stenosed Prox RCA to Mid RCA lesion is 100% stenosed LIMA graft was visualized by angiography and is normal in caliber The graft exhibits no disease Seq SVG- OM 1 and PDA graft was visualized by angiography  and is normal in caliber SVG graft was visualized by angiography and is normal in caliber. Origin lesion before 1st Mrg is 50% stenosed LV end diastolic pressure is moderately elevated. 1. Severe 3 vessel occlusive CAD 2. Patent LIMA to the LAD 3. Patent sequential SVG to the first OM and PDA. Patent Y graft SVG to the diagonal 4. Moderately elevated LVEDP of 30 mm Hg.   12-30-17: chest x-ray: Satisfactory post AICD radiograph  TODAY:   01-10-18: chest x-ray: mild congestive changes  01-10-18: EKG: sinus rhythm    LABS REVIEWED; PREVIOUS;  12-25-17: wbc 15.7; hgb 10.3; hct 31.1; mcv 95.7; plt 334; glucose 121; bun 24; create 1.96 ;k+ 4.8; na++ 132; ca 9.9; liver norma albumin 3.7; BNP 325.6; blood culture: no growth; urine strep pneumoniae; Positive 12-26-17: wbc 6.7; hgb 9.4; hct 28.1; mcv 97.6; plt 307; glucose 107; bun 26; creat 1.96; k+ 4.0; na++ 135; ca 9.4; mag 3.7; tsh 1.980; hgb a1c 5.6; chol 146; ldl 81; trig 166; hdl 32; resp. Culture: no growth 12-29-17: wbc 10.3; hgb 9.7; hct 27.9; mcv 98.6; pt 311; glucose 121; bun 36; creat 2.04; k+ 4.4; na++ 136; ca 9.6; mag 2.4; BNP 953.1 01-02-18: glucose 98; bun 37; creat 2.19; k+ 3.6; na++ 138; ca 9.3   TODAY:    01-10-18: wbc 6.6; hgb 9.9; hct 27.5; mcv 97.6; plt 335; glucsoe 127; bun 17.2; creat 2.11; k+ 4.7; na++ 141; ca 10.2; alk phos 218; albumin 4.2    Review of Systems  Constitutional: Negative for malaise/fatigue.  Respiratory: Negative for cough and  shortness of breath.   Cardiovascular: Negative for chest pain, palpitations and leg swelling.  Gastrointestinal: Negative for abdominal pain, constipation and heartburn.  Musculoskeletal: Negative for back pain, joint pain and myalgias.  Skin: Negative.   Neurological: Negative for dizziness.  Psychiatric/Behavioral: The patient is not nervous/anxious.     Physical Exam  Constitutional: He is oriented to person, place, and time. He appears well-developed and well-nourished. He appears distressed.  Neck: Neck supple. Thyromegaly present.  Left thyroid slightly enlarged   Cardiovascular: Normal rate, regular rhythm and intact distal pulses.  Murmur heard. 2/6 Has ICD   Pulmonary/Chest: Effort normal and breath sounds normal. No respiratory distress.  Abdominal: Soft. Bowel sounds are normal. He exhibits no distension. There is no tenderness.  Musculoskeletal: Normal range of motion. He exhibits edema.  Neurological: He is alert and oriented to person, place, and time.  Skin: Skin is warm and dry. He is not diaphoretic.  Psychiatric: He has a normal mood and affect.    ASSESSMENT/ PLAN:  TODAY  1. Presyncope: will continue his current plan of care and will continue to monitor his status; have and continue to encourage more po intake   MD is aware of resident's narcotic use and is in agreement with current plan of care. We will attempt to wean resident as apropriate   Ok Edwards NP [facility]  Contact [phone] Monday through Friday 8am- 5pm  After hours call [phone]

## 2018-01-15 ENCOUNTER — Non-Acute Institutional Stay (SKILLED_NURSING_FACILITY): Payer: PPO | Admitting: Adult Health

## 2018-01-15 ENCOUNTER — Encounter: Payer: Self-pay | Admitting: Adult Health

## 2018-01-15 DIAGNOSIS — R630 Anorexia: Secondary | ICD-10-CM | POA: Diagnosis not present

## 2018-01-15 NOTE — Progress Notes (Signed)
Location:   [facility] Room Number: [place] of Service:  SNF (31)   CODE STATUS: Full Code  Allergies  Allergen Reactions  . Ipratropium Other (See Comments)  . Morphine And Related Nausea Only  . Vicodin [Hydrocodone-Acetaminophen] Other (See Comments)    Hallucinations   . Penicillins Rash    Chief Complaint  Patient presents with  . Acute Visit    Appetite    HPI:  His wife is concerned that he is not eating well. He tells me that he does not have an appetite. He denies any na/v/d. He does tell me that he is easily fatigued. There are no reports of fevers present; no further syncopal episodes present.  His weight is presently stable.   Past Medical History:  Diagnosis Date  . AF (paroxysmal atrial fibrillation) ([place])   . CAD (coronary artery disease)   . Cardiomyopathy, ischemic   . Carotid artery occlusion   . Chronic kidney disease   . COPD (chronic obstructive pulmonary disease) ([place])   . Dementia without behavioral disturbance   . Hyperlipidemia   . Hypertension   . Sleep apnea   . Stroke ([place])   . TIA (transient ischemic attack)     Past Surgical History:  Procedure Laterality Date  . CARDIAC PACEMAKER PLACEMENT    . CATARACT EXTRACTION    . CORONARY ARTERY BYPASS GRAFT    . CORONARY STENT PLACEMENT    . EYE SURGERY    . ICD IMPLANT N/A 12/29/2017   Procedure: ICD IMPLANT;  Surgeon: Evans Lance, MD;  Location: [facility];  Service: Cardiovascular;  Laterality: N/A;  . LEFT HEART CATH AND CORS/GRAFTS ANGIOGRAPHY N/A 12/29/2017   Procedure: LEFT HEART CATH AND CORS/GRAFTS ANGIOGRAPHY;  Surgeon: Martinique, Peter M, MD;  Location: [facility];  Service: Cardiovascular;  Laterality: N/A;    Social History   Socioeconomic History  . Marital status: Married    Spouse name: Not on file  . Number of children: Not on file  . Years of education: Not on file  . Highest education level: Not on file  Social Needs  . Financial  resource strain: Not on file  . Food insecurity - worry: Not on file  . Food insecurity - inability: Not on file  . Transportation needs - medical: Not on file  . Transportation needs - non-medical: Not on file  Occupational History  . Not on file  Tobacco Use  . Smoking status: Former Smoker    Last attempt to quit: 11/30/2012    Years since quitting: 5.1  . Smokeless tobacco: Never Used  Substance and Sexual Activity  . Alcohol use: No  . Drug use: No  . Sexual activity: Not on file  Other Topics Concern  . Not on file  Social History Narrative  . Not on file   Family History  Problem Relation Age of Onset  . Hypertension Mother   . Heart disease Mother   . Hypertension Sister   . Heart disease Sister       VITAL SIGNS BP (!) 150/84   Pulse (!) 58   Temp (!) 97.3 F (36.3 C)   Resp 18   Ht _0  (1.727 m)   Wt 154 lb 6.4 oz (70 kg)   SpO2 95%   BMI 23.48 kg/m    Outpatient Encounter Medications as of 01/15/2018  Medication Sig  . acetaminophen (TYLENOL) 325 MG tablet Take 650 mg by mouth  3 (three) times daily.  Marland Kitchen albuterol (ACCUNEB) 0.63 MG/3ML nebulizer solution 3 ml inhale orally via nebulizer four times a day  . albuterol (PROVENTIL HFA;VENTOLIN HFA) 108 (90 Base) MCG/ACT inhaler Inhale 2 puffs into the lungs every 6 (six) hours as needed.   Marland Kitchen amiodarone (PACERONE) 400 MG tablet Take 1 tablet (400 mg total) by mouth daily.  Marland Kitchen apixaban (ELIQUIS) 5 MG TABS tablet Take 5 mg by mouth 2 (two) times daily.  Marland Kitchen aspirin 325 MG tablet Take 1 tablet (325 mg total) by mouth daily.  Marland Kitchen atorvastatin (LIPITOR) 40 MG tablet Take 40 mg by mouth daily.  . Budesonide 90 MCG/ACT inhaler Inhale 1 puff into the lungs 2 (two) times daily.  . cholecalciferol (VITAMIN D) 1000 units tablet Take 1,000 Units by mouth daily.  . Cyanocobalamin (VITAMIN B 12 PO) Take by mouth daily.  Marland Kitchen donepezil (ARICEPT) 23 MG TABS tablet Take 23 mg by mouth daily.  . famotidine (PEPCID) 20 MG tablet Take  20 mg by mouth daily.  . ferrous sulfate 325 (65 FE) MG tablet Take 325 mg by mouth daily with breakfast.  . Fluticasone-Salmeterol (ADVAIR) 250-50 MCG/DOSE AEPB Inhale 1 puff into the lungs 2 (two) times daily.  . folic acid (FOLVITE) 947 MCG tablet Take 400 mcg by mouth daily.  . furosemide (LASIX) 40 MG tablet Take 1 tablet (40 mg total) by mouth daily.  . hydrALAZINE (APRESOLINE) 100 MG tablet Take 100 mg by mouth 2 (two) times daily.   Marland Kitchen labetalol (NORMODYNE) 200 MG tablet Take 200 mg by mouth 2 (two) times daily.  . memantine (NAMENDA XR) 7 MG CP24 24 hr capsule Take 1 capsule (7 mg total) by mouth daily.  . metoprolol succinate (TOPROL-XL) 25 MG 24 hr tablet Take 25 mg by mouth daily.  . Multiple Vitamin (MULTIVITAMIN) tablet Take 1 tablet by mouth daily.  . nitroGLYCERIN (NITROSTAT) 0.4 MG SL tablet Place 0.4 mg under the tongue every 5 (five) minutes as needed for chest pain.  . OXYGEN Place 1 L/min into the nose continuous.  Marland Kitchen tiotropium (SPIRIVA HANDIHALER) 18 MCG inhalation capsule Place 18 mcg into inhaler and inhale daily.  . traMADol (ULTRAM) 50 MG tablet Take 1 tablet (50 mg total) by mouth every 6 (six) hours as needed for moderate pain.  . [DISCONTINUED] metoprolol succinate (TOPROL-XL) 25 MG 24 hr tablet Take 1 tablet (25 mg total) by mouth 2 (two) times daily with a meal. (Patient not taking: Reported on 01/15/2018)   No facility-administered encounter medications on file as of 01/15/2018.      SIGNIFICANT DIAGNOSTIC EXAMS  PREVIOUS:   12-11-17; bilateral carotid doppler: right ICA occlusion. velocities suggest 40-59% left ICA stenosis, status post stenting; however; velocities may be falsely elevate secondary to contralateral occlusion.   01-25-17: chest x-ray: 1. Right upper lobe pneumonia. 2. Interval small may irregular nodular density at the right lung base, laterally. Attention to this area on follow-up chest radiographs is recommended. 3. Mild changes of COPD and  chronic bronchitis with stable left basilar pleural and parenchymal scarring.  12-27-17: 2-d echo: - Since the prior study on 01/18/2016 LVEF has decreased from 50-55% to 40-45% with basal and mid inferolateral and anterolateral hypokinesis.  12-29-17: ICD implant   12-29-17: cardiac cath: Mid LM lesion is 75% stenosed. Ost LAD to Prox LAD lesion is 100% stenosed. Ost 1st Mrg lesion is 100% stenosed Prox Cx to Mid Cx lesion is 100% stenosed Prox RCA to Mid RCA lesion is 100%  stenosed LIMA graft was visualized by angiography and is normal in caliber The graft exhibits no disease Seq SVG- OM 1 and PDA graft was visualized by angiography and is normal in caliber SVG graft was visualized by angiography and is normal in caliber. Origin lesion before 1st Mrg is 50% stenosed LV end diastolic pressure is moderately elevated. 1. Severe 3 vessel occlusive CAD 2. Patent LIMA to the LAD 3. Patent sequential SVG to the first OM and PDA. Patent Y graft SVG to the diagonal 4. Moderately elevated LVEDP of 30 mm Hg.   12-30-17: chest x-ray: Satisfactory post AICD radiograph  01-10-18: chest x-ray: mild congestive changes  01-10-18: EKG: sinus rhythm  NO NEW EXAMS     LABS REVIEWED; PREVIOUS;  12-25-17: wbc 15.7; hgb 10.3; hct 31.1; mcv 95.7; plt 334; glucose 121; bun 24; create 1.96 ;k+ 4.8; na++ 132; ca 9.9; liver norma albumin 3.7; BNP 325.6; blood culture: no growth; urine strep pneumoniae; Positive 12-26-17: wbc 6.7; hgb 9.4; hct 28.1; mcv 97.6; plt 307; glucose 107; bun 26; creat 1.96; k+ 4.0; na++ 135; ca 9.4; mag 3.7; tsh 1.980; hgb a1c 5.6; chol 146; ldl 81; trig 166; hdl 32; resp. Culture: no growth 12-29-17: wbc 10.3; hgb 9.7; hct 27.9; mcv 98.6; pt 311; glucose 121; bun 36; creat 2.04; k+ 4.4; na++ 136; ca 9.6; mag 2.4; BNP 953.1 01-02-18: glucose 98; bun 37; creat 2.19; k+ 3.6; na++ 138; ca 9.3  01-10-18: wbc 6.6; hgb 9.9; hct 27.5; mcv 97.6; plt 335; glucsoe 127; bun 17.2; creat 2.11; k+ 4.7; na++ 141; ca 10.2;  alk phos 218; albumin 4.2  NO NEW LABS     Review of Systems  Constitutional: Positive for malaise/fatigue.  Respiratory: Negative for cough and shortness of breath.   Cardiovascular: Negative for chest pain, palpitations and leg swelling.  Gastrointestinal: Negative for abdominal pain, constipation and heartburn.  Musculoskeletal: Negative for back pain, joint pain and myalgias.  Skin: Negative.   Neurological: Negative for dizziness.  Psychiatric/Behavioral: The patient is not nervous/anxious.     Physical Exam  Constitutional: He appears well-developed and well-nourished. No distress.  Neck:  Mild left thyroid enlargement  Cardiovascular: Normal rate, regular rhythm and intact distal pulses.  Murmur heard. 2/6 Has ICD  Pulmonary/Chest: Effort normal and breath sounds normal. No respiratory distress.  Abdominal: Soft. Bowel sounds are normal. He exhibits no distension.  Musculoskeletal: Normal range of motion. He exhibits no edema.  Lymphadenopathy:    He has no cervical adenopathy.  Neurological: He is alert.  Skin: Skin is warm and dry. He is not diaphoretic.  Psychiatric: He has a normal mood and affect.    ASSESSMENT/ PLAN:  TODAY:   1. Anorexia: will begin remeron 7.5 mg nightly for 14 days to help stimulate appetite; there is no weight loss yet   MD is aware of resident's narcotic use and is in agreement with current plan of care. We will attempt to wean resident as apropriate   Ok Edwards NP [facility]  Contact [phone] Monday through Friday 8am- 5pm  After hours call [phone]

## 2018-01-16 ENCOUNTER — Encounter: Payer: Self-pay | Admitting: Adult Health

## 2018-01-16 ENCOUNTER — Non-Acute Institutional Stay (SKILLED_NURSING_FACILITY): Payer: PPO | Admitting: Adult Health

## 2018-01-16 DIAGNOSIS — R55 Syncope and collapse: Secondary | ICD-10-CM | POA: Diagnosis not present

## 2018-01-16 DIAGNOSIS — R079 Chest pain, unspecified: Secondary | ICD-10-CM | POA: Diagnosis not present

## 2018-01-16 DIAGNOSIS — I480 Paroxysmal atrial fibrillation: Secondary | ICD-10-CM | POA: Diagnosis not present

## 2018-01-16 DIAGNOSIS — M6281 Muscle weakness (generalized): Secondary | ICD-10-CM | POA: Diagnosis not present

## 2018-01-16 DIAGNOSIS — R630 Anorexia: Secondary | ICD-10-CM | POA: Insufficient documentation

## 2018-01-16 HISTORY — DX: Syncope and collapse: R55

## 2018-01-16 NOTE — Progress Notes (Signed)
Location:   [facility] Room Number: [place] of Service:  SNF (31)   CODE STATUS: Full Code  Allergies  Allergen Reactions  . Ipratropium Other (See Comments)  . Morphine And Related Nausea Only  . Vicodin [Hydrocodone-Acetaminophen] Other (See Comments)    Hallucinations   . Penicillins Rash    Chief Complaint  Patient presents with  . Acute Visit    Near Syncope    HPI:  He was in therapy when he experienced a decreased level of consciousness. He was placed back in bed. His blood pressure was 100 /54 and bradycardic at 45 apical. His skin is warm and dry. He is verbally responsive. He is taking labetalol toprol xl; apresoline amiodarone. There are no reports of missed medication doses . Staff reports that he ate a good breakfast   Past Medical History:  Diagnosis Date  . AF (paroxysmal atrial fibrillation) ([place])   . CAD (coronary artery disease)   . Cardiomyopathy, ischemic   . Carotid artery occlusion   . Chronic kidney disease   . COPD (chronic obstructive pulmonary disease) ([place])   . Dementia without behavioral disturbance   . Hyperlipidemia   . Hypertension   . Sleep apnea   . Stroke ([place])   . TIA (transient ischemic attack)     Past Surgical History:  Procedure Laterality Date  . CARDIAC PACEMAKER PLACEMENT    . CATARACT EXTRACTION    . CORONARY ARTERY BYPASS GRAFT    . CORONARY STENT PLACEMENT    . EYE SURGERY    . ICD IMPLANT N/A 12/29/2017   Procedure: ICD IMPLANT;  Surgeon: Evans Lance, MD;  Location: [facility];  Service: Cardiovascular;  Laterality: N/A;  . LEFT HEART CATH AND CORS/GRAFTS ANGIOGRAPHY N/A 12/29/2017   Procedure: LEFT HEART CATH AND CORS/GRAFTS ANGIOGRAPHY;  Surgeon: Martinique, Peter M, MD;  Location: [facility];  Service: Cardiovascular;  Laterality: N/A;    Social History   Socioeconomic History  . Marital status: Married    Spouse name: Not on file  . Number of children: Not on file  . Years of  education: Not on file  . Highest education level: Not on file  Social Needs  . Financial resource strain: Not on file  . Food insecurity - worry: Not on file  . Food insecurity - inability: Not on file  . Transportation needs - medical: Not on file  . Transportation needs - non-medical: Not on file  Occupational History  . Not on file  Tobacco Use  . Smoking status: Former Smoker    Last attempt to quit: 11/30/2012    Years since quitting: 5.1  . Smokeless tobacco: Never Used  Substance and Sexual Activity  . Alcohol use: No  . Drug use: No  . Sexual activity: Not on file  Other Topics Concern  . Not on file  Social History Narrative  . Not on file   Family History  Problem Relation Age of Onset  . Hypertension Mother   . Heart disease Mother   . Hypertension Sister   . Heart disease Sister       VITAL SIGNS BP (!) 130/58   Pulse (!) 51   Temp (!) 97.3 F (36.3 C)   Resp 20   Ht 5' 8" (1.727 m)   Wt 154 lb 6.4 oz (70 kg)   SpO2 98%   BMI 23.48 kg/m    Outpatient Encounter Medications as of 01/16/2018  Medication Sig  . acetaminophen (TYLENOL) 325 MG tablet Take 650 mg by mouth 3 (three) times daily.  Marland Kitchen albuterol (ACCUNEB) 0.63 MG/3ML nebulizer solution 3 ml inhale orally via nebulizer four times a day  . albuterol (PROVENTIL HFA;VENTOLIN HFA) 108 (90 Base) MCG/ACT inhaler Inhale 2 puffs into the lungs every 6 (six) hours as needed.   Marland Kitchen amiodarone (PACERONE) 400 MG tablet Take 1 tablet (400 mg total) by mouth daily.  Marland Kitchen apixaban (ELIQUIS) 5 MG TABS tablet Take 5 mg by mouth 2 (two) times daily.  Marland Kitchen aspirin 325 MG tablet Take 1 tablet (325 mg total) by mouth daily.  Marland Kitchen atorvastatin (LIPITOR) 40 MG tablet Take 40 mg by mouth daily.  . Budesonide 90 MCG/ACT inhaler Inhale 1 puff into the lungs 2 (two) times daily.  . cholecalciferol (VITAMIN D) 1000 units tablet Take 1,000 Units by mouth daily.  . Cyanocobalamin (VITAMIN B 12 PO) Take by mouth daily.  Marland Kitchen donepezil  (ARICEPT) 23 MG TABS tablet Take 23 mg by mouth daily.  . famotidine (PEPCID) 20 MG tablet Take 20 mg by mouth daily.  . ferrous sulfate 325 (65 FE) MG tablet Take 325 mg by mouth daily with breakfast.  . Fluticasone-Salmeterol (ADVAIR) 250-50 MCG/DOSE AEPB Inhale 1 puff into the lungs 2 (two) times daily.  . folic acid (FOLVITE) 826 MCG tablet Take 400 mcg by mouth daily.  . furosemide (LASIX) 40 MG tablet Take 1 tablet (40 mg total) by mouth daily.  . hydrALAZINE (APRESOLINE) 100 MG tablet Take 100 mg by mouth 2 (two) times daily.   Marland Kitchen labetalol (NORMODYNE) 200 MG tablet Take 200 mg by mouth 2 (two) times daily.  . memantine (NAMENDA XR) 7 MG CP24 24 hr capsule Take 1 capsule (7 mg total) by mouth daily.  . metoprolol succinate (TOPROL-XL) 25 MG 24 hr tablet Take 25 mg by mouth daily.  . mirtazapine (REMERON) 7.5 MG tablet Take 7.5 mg by mouth at bedtime. X 14 days  . Multiple Vitamin (MULTIVITAMIN) tablet Take 1 tablet by mouth daily.  . nitroGLYCERIN (NITROSTAT) 0.4 MG SL tablet Place 0.4 mg under the tongue every 5 (five) minutes as needed for chest pain.  . OXYGEN Place 1 L/min into the nose continuous.  Marland Kitchen tiotropium (SPIRIVA HANDIHALER) 18 MCG inhalation capsule Place 18 mcg into inhaler and inhale daily.  . traMADol (ULTRAM) 50 MG tablet Take 1 tablet (50 mg total) by mouth every 6 (six) hours as needed for moderate pain.   No facility-administered encounter medications on file as of 01/16/2018.      SIGNIFICANT DIAGNOSTIC EXAMS  PREVIOUS:   12-11-17; bilateral carotid doppler: right ICA occlusion. velocities suggest 40-59% left ICA stenosis, status post stenting; however; velocities may be falsely elevate secondary to contralateral occlusion.   01-25-17: chest x-ray: 1. Right upper lobe pneumonia. 2. Interval small may irregular nodular density at the right lung base, laterally. Attention to this area on follow-up chest radiographs is recommended. 3. Mild changes of COPD and  chronic bronchitis with stable left basilar pleural and parenchymal scarring.  12-27-17: 2-d echo: - Since the prior study on 01/18/2016 LVEF has decreased from 50-55% to 40-45% with basal and mid inferolateral and anterolateral hypokinesis.  12-29-17: ICD implant   12-29-17: cardiac cath: Mid LM lesion is 75% stenosed. Ost LAD to Prox LAD lesion is 100% stenosed. Ost 1st Mrg lesion is 100% stenosed Prox Cx to Mid Cx lesion is 100% stenosed Prox RCA to Mid RCA lesion is 100% stenosed  LIMA graft was visualized by angiography and is normal in caliber The graft exhibits no disease Seq SVG- OM 1 and PDA graft was visualized by angiography and is normal in caliber SVG graft was visualized by angiography and is normal in caliber. Origin lesion before 1st Mrg is 50% stenosed LV end diastolic pressure is moderately elevated. 1. Severe 3 vessel occlusive CAD 2. Patent LIMA to the LAD 3. Patent sequential SVG to the first OM and PDA. Patent Y graft SVG to the diagonal 4. Moderately elevated LVEDP of 30 mm Hg.   12-30-17: chest x-ray: Satisfactory post AICD radiograph  01-10-18: chest x-ray: mild congestive changes  01-10-18: EKG: sinus rhythm  NO NEW EXAMS     LABS REVIEWED; PREVIOUS;  12-25-17: wbc 15.7; hgb 10.3; hct 31.1; mcv 95.7; plt 334; glucose 121; bun 24; create 1.96 ;k+ 4.8; na++ 132; ca 9.9; liver norma albumin 3.7; BNP 325.6; blood culture: no growth; urine strep pneumoniae; Positive 12-26-17: wbc 6.7; hgb 9.4; hct 28.1; mcv 97.6; plt 307; glucose 107; bun 26; creat 1.96; k+ 4.0; na++ 135; ca 9.4; mag 3.7; tsh 1.980; hgb a1c 5.6; chol 146; ldl 81; trig 166; hdl 32; resp. Culture: no growth 12-29-17: wbc 10.3; hgb 9.7; hct 27.9; mcv 98.6; pt 311; glucose 121; bun 36; creat 2.04; k+ 4.4; na++ 136; ca 9.6; mag 2.4; BNP 953.1 01-02-18: glucose 98; bun 37; creat 2.19; k+ 3.6; na++ 138; ca 9.3  01-10-18: wbc 6.6; hgb 9.9; hct 27.5; mcv 97.6; plt 335; glucsoe 127; bun 17.2; creat 2.11; k+ 4.7; na++ 141; ca 10.2;  alk phos 218; albumin 4.2  NO NEW LABS     Review of Systems  Unable to perform ROS: Medical condition (confused )    Physical Exam  Constitutional: He appears well-developed and well-nourished. No distress.  Neck: Thyromegaly present.  Mild left thyroid enlargement   Cardiovascular: Regular rhythm and intact distal pulses.  Murmur heard. 2/6 Is bradycardiac  Has ICD   Pulmonary/Chest: Effort normal and breath sounds normal. No respiratory distress.  Using 02  Abdominal: Soft. Bowel sounds are normal. He exhibits no distension. There is no tenderness.  Musculoskeletal: Normal range of motion. He exhibits no edema.  Lymphadenopathy:    He has no cervical adenopathy.  Neurological: He is alert.  Skin: Skin is warm and dry. He is not diaphoretic.  Psychiatric: He has a normal mood and affect.    ASSESSMENT/ PLAN:  TODAY:   1. Paroxysmal atrial fibrillation 2. Pre-syncope 3. Bradycardia  Will stop toprol xl and will continue labetalol Will continue apresoline at this time and will monitor his status     MD is aware of resident's narcotic use and is in agreement with current plan of care. We will attempt to wean resident as apropriate   Ok Edwards NP [facility]  Contact [phone] Monday through Friday 8am- 5pm  After hours call [phone]

## 2018-01-23 DIAGNOSIS — R079 Chest pain, unspecified: Secondary | ICD-10-CM | POA: Diagnosis not present

## 2018-01-23 DIAGNOSIS — M6281 Muscle weakness (generalized): Secondary | ICD-10-CM | POA: Diagnosis not present

## 2018-01-25 ENCOUNTER — Non-Acute Institutional Stay (SKILLED_NURSING_FACILITY): Payer: PPO | Admitting: Internal Medicine

## 2018-01-25 DIAGNOSIS — Z8674 Personal history of sudden cardiac arrest: Secondary | ICD-10-CM

## 2018-01-25 DIAGNOSIS — F0150 Vascular dementia without behavioral disturbance: Secondary | ICD-10-CM

## 2018-01-25 DIAGNOSIS — I951 Orthostatic hypotension: Secondary | ICD-10-CM

## 2018-01-25 DIAGNOSIS — R001 Bradycardia, unspecified: Secondary | ICD-10-CM | POA: Diagnosis not present

## 2018-01-26 DIAGNOSIS — R079 Chest pain, unspecified: Secondary | ICD-10-CM | POA: Diagnosis not present

## 2018-01-26 DIAGNOSIS — M6281 Muscle weakness (generalized): Secondary | ICD-10-CM | POA: Diagnosis not present

## 2018-01-29 ENCOUNTER — Non-Acute Institutional Stay (SKILLED_NURSING_FACILITY): Payer: PPO | Admitting: Internal Medicine

## 2018-01-29 DIAGNOSIS — F0150 Vascular dementia without behavioral disturbance: Secondary | ICD-10-CM

## 2018-01-29 DIAGNOSIS — Z8674 Personal history of sudden cardiac arrest: Secondary | ICD-10-CM | POA: Diagnosis not present

## 2018-01-29 DIAGNOSIS — J189 Pneumonia, unspecified organism: Secondary | ICD-10-CM

## 2018-01-29 DIAGNOSIS — Z8673 Personal history of transient ischemic attack (TIA), and cerebral infarction without residual deficits: Secondary | ICD-10-CM | POA: Diagnosis not present

## 2018-01-29 DIAGNOSIS — I480 Paroxysmal atrial fibrillation: Secondary | ICD-10-CM

## 2018-01-29 DIAGNOSIS — Z95810 Presence of automatic (implantable) cardiac defibrillator: Secondary | ICD-10-CM

## 2018-01-29 DIAGNOSIS — J181 Lobar pneumonia, unspecified organism: Secondary | ICD-10-CM | POA: Diagnosis not present

## 2018-01-29 NOTE — Progress Notes (Signed)
Patient ID: Joe Wiggins, male   DOB: 1946-06-30, 72 y.o.   MRN: [id number]  Location:  [facility]   Place of Service:  SNF (31) Provider:  DR Farrel Demark, MD  Patient Care Team: Kathrene Alu, MD as PCP - General (Family Medicine) Jacolyn Reedy, MD as Consulting Physician (Cardiology) Charlaine Dalton, MD as Referring Physician  Extended Emergency Contact Information Primary Emergency Contact: [place] Phone: [phone] Relation: Spouse  Code Status:   Goals of care: Advanced Directive information Advanced Directives 01/16/2018  Does Patient Have a Medical Advance Directive? Yes  Type of Advance Directive Out of facility DNR (pink MOST or yellow form)  Does patient want to make changes to medical advance directive? No - Patient declined  Copy of [place] in Chart? -  Would patient like information on creating a medical advance directive? -  Pre-existing out of facility DNR order (yellow form or pink MOST form) Pink MOST form placed in chart (order not valid for inpatient use)     Chief Complaint  Patient presents with  . Discharge Note    HPI:  Pt is a 72 y.o. male seen today for d/c from SNF to home. No further agitation or insomnia. No syncopal or presyncopal episodes. He does get really tire after taking all meds in AM. No falls. He is a poor historian due to cognitive impairment. Hx obtained from spouse at bedside.   SUMMARY OF HOSPITAL/SNF STAY: She was admitted to SNF following hospital stay for RUL CAP, acute hypoxia, irregularity RLL nodule, hyponatremia, CP, vascular dementia. CXR (+) RUL infiltrate. He had SVT -> cardiac arrest on 12/26/17 2/2 VF. 14 min resuscitation given ROSC. Cardio consulted. Cardiac cath on 12/29/17 revealed patent grafts; ICD placed. He was on amiodarone gtt during admission.2D echo showed EF 40-45%; grade 2 DD; mild concentric LVH. He presents to  SNF for short term rehab. He completed tx.  CAD - s/p CABG; recent MI. Takes ASA 325 mg daily; prn ntg. No further CP. EF 45-50%  HTN - BP elevated on apresoline 100 mg twice daily; labetolol 200 mg twice daily; toprol xl 25 mg twice daily   PAF - rate controlled on amiodarone 400 mg daily for one month then 200 mg daily; toprol xl 25 mg twice daily; labateolol 200 mg twice daily; he takes eliquis 5 mg twice daily for anticoagulation  ICM - stable. 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 LDL 81   Dementia without behavioral disturbance - stable on aricept 23 mg daily and namenda xr 7 mg daily   CKD - stage 3. Cr 2.19  Anemia of chronic disease - stable on iron daily; Hgb 9.7   Past Medical History:  Diagnosis Date  . AF (paroxysmal atrial fibrillation) ([place])   . CAD (coronary artery disease)   . Cardiomyopathy, ischemic   . Carotid artery occlusion   . Chronic kidney disease   . COPD (chronic obstructive pulmonary disease) ([place])   . Dementia without behavioral disturbance   . Hyperlipidemia   . Hypertension   . Sleep apnea   . Stroke ([place])   .  TIA (transient ischemic attack)    Past Surgical History:  Procedure Laterality Date  . CARDIAC PACEMAKER PLACEMENT    . CATARACT EXTRACTION    . CORONARY ARTERY BYPASS GRAFT    . CORONARY STENT PLACEMENT    . EYE SURGERY    . ICD IMPLANT N/A 12/29/2017   Procedure: ICD IMPLANT;  Surgeon: Evans Lance, MD;  Location: [place] [facility];  Service: Cardiovascular;  Laterality: N/A;  . LEFT HEART CATH AND CORS/GRAFTS ANGIOGRAPHY N/A 12/29/2017   Procedure: LEFT HEART CATH AND CORS/GRAFTS ANGIOGRAPHY;  Surgeon: Martinique, Peter M, MD;  Location: [place] [facility];   Service: Cardiovascular;  Laterality: N/A;    Allergies  Allergen Reactions  . Ipratropium Other (See Comments)  . Morphine And Related Nausea Only  . Vicodin [Hydrocodone-Acetaminophen] Other (See Comments)    Hallucinations   . Penicillins Rash    Outpatient Encounter Medications as of 01/29/2018  Medication Sig  . acetaminophen (TYLENOL) 325 MG tablet Take 650 mg by mouth 3 (three) times daily.  Marland Kitchen albuterol (ACCUNEB) 0.63 MG/3ML nebulizer solution 3 ml inhale orally via nebulizer four times a day  . albuterol (PROVENTIL HFA;VENTOLIN HFA) 108 (90 Base) MCG/ACT inhaler Inhale 2 puffs into the lungs every 6 (six) hours as needed.   Marland Kitchen amiodarone (PACERONE) 400 MG tablet Take 1 tablet (400 mg total) by mouth daily.  Marland Kitchen apixaban (ELIQUIS) 5 MG TABS tablet Take 5 mg by mouth 2 (two) times daily.  Marland Kitchen aspirin 325 MG tablet Take 1 tablet (325 mg total) by mouth daily.  Marland Kitchen atorvastatin (LIPITOR) 40 MG tablet Take 40 mg by mouth daily.  . Budesonide 90 MCG/ACT inhaler Inhale 1 puff into the lungs 2 (two) times daily.  . cholecalciferol (VITAMIN D) 1000 units tablet Take 1,000 Units by mouth daily.  . Cyanocobalamin (VITAMIN B 12 PO) Take by mouth daily.  Marland Kitchen donepezil (ARICEPT) 23 MG TABS tablet Take 23 mg by mouth daily.  . famotidine (PEPCID) 20 MG tablet Take 20 mg by mouth daily.  . ferrous sulfate 325 (65 FE) MG tablet Take 325 mg by mouth daily with breakfast.  . Fluticasone-Salmeterol (ADVAIR) 250-50 MCG/DOSE AEPB Inhale 1 puff into the lungs 2 (two) times daily.  . folic acid (FOLVITE) 409 MCG tablet Take 400 mcg by mouth daily.  . furosemide (LASIX) 40 MG tablet Take 1 tablet (40 mg total) by mouth daily.  . hydrALAZINE (APRESOLINE) 100 MG tablet Take 100 mg by mouth 2 (two) times daily.   Marland Kitchen labetalol (NORMODYNE) 200 MG tablet Take 200 mg by mouth 2 (two) times daily.  . memantine (NAMENDA XR) 7 MG CP24 24 hr capsule Take 1 capsule (7 mg total) by mouth daily.  . metoprolol succinate  (TOPROL-XL) 25 MG 24 hr tablet Take 25 mg by mouth daily.  . mirtazapine (REMERON) 7.5 MG tablet Take 7.5 mg by mouth at bedtime. X 14 days  . Multiple Vitamin (MULTIVITAMIN) tablet Take 1 tablet by mouth daily.  . nitroGLYCERIN (NITROSTAT) 0.4 MG SL tablet Place 0.4 mg under the tongue every 5 (five) minutes as needed for chest pain.  . OXYGEN Place 1 L/min into the nose continuous.  Marland Kitchen tiotropium (SPIRIVA HANDIHALER) 18 MCG inhalation capsule Place 18 mcg into inhaler and inhale daily.  . traMADol (ULTRAM) 50 MG tablet Take 1 tablet (50 mg total) by mouth every 6 (six) hours as needed for moderate pain.   No facility-administered encounter medications on file as of 01/29/2018.  Review of Systems  Immunization History  Administered Date(s) Administered  . Influenza-Unspecified 08/22/2017  . PPD Test 01/03/2018  . Pneumococcal Conjugate-13 08/14/2017  . Pneumococcal Polysaccharide-23 06/26/2013  . Zoster 11/07/2014   Pertinent  Health Maintenance Due  Topic Date Due  . COLONOSCOPY  01/03/2019 (Originally 05/06/1995)  . INFLUENZA VACCINE  Completed  . PNA vac Low Risk Adult  Completed   Fall Risk  03/22/2016  Falls in the past year? No   Functional Status Survey:    Vitals:   01/29/18 1325  BP: 138/72  Pulse: 62  Temp: (!) 97.1 F (36.2 C)   There is no height or weight on file to calculate BMI. Physical Exam  Labs reviewed: Recent Labs    12/30/17 0339 12/31/17 0238 01/01/18 0249 01/02/18 0241 01/10/18  NA 137 134* 137 138 141  K 3.8 3.5 3.9 3.6 4.7  CL 104 103 107 105  --   CO2 22 22 22  21*  --   GLUCOSE 117* 111* 104* 98  --   BUN 36* 35* 34* 37* 17  CREATININE 1.97* 2.13* 2.00* 2.19* 2.1*  CALCIUM 9.5 9.6 9.4 9.3  --   MG 2.2 2.1 2.1  --   --   PHOS  --  3.7 3.6  --   --    Recent Labs    12/25/17 1113 01/10/18  AST 24 21  ALT 16* 15  ALKPHOS 120 218*  BILITOT 0.8  --   PROT 7.1  --   ALBUMIN 3.7  --    Recent Labs    12/30/17 0339  12/31/17 0238 01/01/18 0249 01/10/18  WBC 8.2 8.4 7.6 6.6  NEUTROABS  --  6.3 5.4 5  HGB 9.0* 8.8* 8.3* 9.9*  HCT 28.0* 26.7* 24.3* 28*  MCV 96.9 97.4 99.6  --   PLT 278 259 238 335   Lab Results  Component Value Date   TSH 1.980 12/26/2017   Lab Results  Component Value Date   HGBA1C 5.6 12/26/2017   Lab Results  Component Value Date   CHOL 146 12/26/2017   HDL 32 (L) 12/26/2017   LDLCALC 81 12/26/2017   TRIG 166 (H) 12/26/2017   CHOLHDL 4.6 12/26/2017    Significant Diagnostic Results in last 30 days:  No results found.  Assessment/Plan   ICD-10-CM   1. Vascular dementia without behavioral disturbance F01.50   2. Paroxysmal atrial fibrillation (HCC) I48.0   3. Community acquired pneumonia of right upper lobe of lung ([place]) J18.1    resolved  4. History of cardiac arrest Z86.74   5. ICD (implantable cardioverter-defibrillator) in place Z95.810   6. History of CVA (cerebrovascular accident) Z86.73      Patient is being discharged with home health services:  PT/OT (gait training, balance and transfers), RN (medication management).  Patient is being discharged with the following durable medical equipment:  3-in-1 commode, O2, standard w/c (Pt suffers from __gait dysfunction/orthostatic dysfunction___ which impairs ability to perform ADLs (toileting, dressing, grooming, bathing) in home. A __cane/walker___will not resolve the issues with performing ADLs. Wheelchair will allow pt to safely perform daily activities. Pt can safely propel wheelchair in home or has caregiver who can provide assistance)   Patient has been advised to f/u with their PCP in 1-2 weeks for a transitions of care visit. (Social services at their facility was responsible for arranging this appointment.)  Pt was provided with adequate prescriptions of noncontrolled medications to reach their scheduled appointment . For controlled substances,  a limited supply was provided as appropriate for the individual  patient. If the pt normally receives these medications from a pain clinic or has a contract with another physician, these medications should be received from that clinic or physician only.  Future labs/tests needed:  None. He will require f/u regarding aricept taper if need be.  TIME SPENT (MINUTES): [place]. Perlie Gold  [facility] [address] [place], [place] [address] [phone] Cell (Monday-Friday 8 AM - 5 PM) [phone] After 5 PM and follow prompts

## 2018-01-31 ENCOUNTER — Other Ambulatory Visit: Payer: Self-pay

## 2018-01-31 NOTE — Patient Outreach (Signed)
[facility]) Care Management  01/31/2018  Joe Wiggins 11-23-1946 [id number]  Transition of care  Referral date: 01/31/18 Referral source: discharged from [facility] on 01/30/18 Insurance: health team advantage  Telephone call to patient regarding transition of care follow up. HIPAA verified. Contact answering phone states she is patients wife and speaks with for him due to him having some dementia.  RNCM confirmed that a designated party release is on file for spouse, Joe Wiggins. Discused transition of care follow up program. Wife verbally agreed to ongoing transition of care follow up for patient.  Spouse states patient is scheduled to see his primary MD on tomorrow 02/01/18.  Wife states home health agency, [facility] has contacted her. States she is waiting for a call back to schedule first home visit.  Wife states [employer] was scheduled to deliver patients oxygen today. Wife states she has not heard from them. RNCM gave wife phone number. Advised to contact [facility] health care to discuss delivery date.   Wife states patient is not taking the aspirin 325mg  as stated on patients medication list. RNCM advised wife to take patients medication list to primary MD appointment and discuss if patient should take the aspirin. Wife verbalized understanding.  Wife states she provides transportation for patient and assists with his daily care. Wife states patient is independent in some things but requires oversight.  RNCM advised that patient should take medication as prescribed and  keep follow up appointments. Reviewed worsening signs of symptoms.  RNCM advised patient to notify MD of any changes in condition prior to scheduled appointment. RNCM provided contact name and number: [phone] or main office number [phone] and 24 hour nurse advise line [phone].  RNCM verified patient aware of 911 services for urgent/ emergent needs. Wife  verbally agreed to next telephone outreach with [facility] for patient.   ASSESSMENT;  Patient inpatient from 12/25/18 to 01/02/18.  Patient then discharged to [facility] rehab and discharged on 01/30/18  PLAN:  RNCM will follow up with patients spouse within 1 week.  RNCM will send [facility] welcome packet/ consent  RNCM will send involvement letter to primary MD.   Quinn Plowman RN,BSN,CCM [facility] Telephonic  [phone]

## 2018-02-01 ENCOUNTER — Encounter: Payer: Self-pay | Admitting: Family Medicine

## 2018-02-01 ENCOUNTER — Other Ambulatory Visit: Payer: Self-pay

## 2018-02-01 ENCOUNTER — Ambulatory Visit (INDEPENDENT_AMBULATORY_CARE_PROVIDER_SITE_OTHER): Payer: PPO | Admitting: Family Medicine

## 2018-02-01 VITALS — BP 130/56 | HR 59 | Temp 98.2°F | Ht 68.0 in | Wt 162.2 lb

## 2018-02-01 DIAGNOSIS — R5381 Other malaise: Secondary | ICD-10-CM | POA: Diagnosis not present

## 2018-02-01 DIAGNOSIS — Z8701 Personal history of pneumonia (recurrent): Secondary | ICD-10-CM | POA: Diagnosis not present

## 2018-02-01 DIAGNOSIS — Z8674 Personal history of sudden cardiac arrest: Secondary | ICD-10-CM | POA: Diagnosis not present

## 2018-02-01 DIAGNOSIS — I480 Paroxysmal atrial fibrillation: Secondary | ICD-10-CM | POA: Diagnosis not present

## 2018-02-01 DIAGNOSIS — J449 Chronic obstructive pulmonary disease, unspecified: Secondary | ICD-10-CM | POA: Diagnosis not present

## 2018-02-01 DIAGNOSIS — Z7689 Persons encountering health services in other specified circumstances: Secondary | ICD-10-CM

## 2018-02-01 NOTE — Progress Notes (Signed)
Subjective:    Joe Wiggins - 72 y.o. male MRN [id number]  Date of birth: Jul 19, 1946  HPI  Joe Wiggins is here for follow-up after his recent discharge from SNF following hospitalization in early January.  He and his wife report that he did not get as much physical therapy as they would have liked at the SNF, but he has recovered significantly since hospitalization.  However, he has started feeling "swimmy-headed" especially when standing up, and his wife notes that he is much sleepier than usual ever since being in the hospital.  She is worried that the amiodarone that he has recently started is the cause of his daytime sleepiness.  Home health RN, PT, and OT are scheduled to be working with Joe Wiggins at home.  His wife has been helping him walk for small periods every day.  She says he has been able to go longer without becoming short of breath each day, but the distance he can walk is still very short.  [name] has stopped his Namenda and Aricept because they were making him agitated.  Health Maintenance:  There are no preventive care reminders to display for this patient.  -  reports that he quit smoking about 5 years ago. he has never used smokeless tobacco. - Review of Systems: Per HPI. - Past Medical History: Patient Active Problem List   Diagnosis Date Noted  . Encounter to establish care with new doctor 02/04/2018  . Pre-syncope 01/16/2018  . Anorexia 01/16/2018  . ICD (implantable cardioverter-defibrillator) in place   . Physical deconditioning   . Acute respiratory failure ([place])   . Cardiac arrest ([place])   . Community acquired pneumonia of right upper lobe of lung ([place])   . HAP (hospital-acquired pneumonia) 12/25/2017  . CAD (coronary artery disease), native coronary artery 01/20/2016  . Long-term (current) use of anticoagulants 01/20/2016  . History of TIA (transient ischemic attack) and stroke 01/20/2016  . Sleep apnea   . Cardiac pacemaker in situ   . Anemia  01/17/2016  . Bilateral carotid artery stenosis   . Hyperlipidemia   . Dementia without behavioral disturbance   . Hypertensive heart disease without CHF   . Paroxysmal atrial fibrillation (HCC)   . Chronic kidney disease, stage 3 ([place])   . COPD exacerbation (HCC)    - Medications: reviewed and updated   Objective:   Physical Exam BP (!) 130/56   Pulse (!) 59   Temp 98.2 F (36.8 C) (Oral)   Ht 5\' 8"  (1.727 m)   Wt 162 lb 3.2 oz (73.6 kg)   SpO2 98%   BMI 24.66 kg/m  Gen: NAD, alert, cooperative with exam, feeble but in good spirits  HEENT: NCAT, clear conjunctiva, supple neck CV: RRR, good S1/S2, no murmur, no edema, capillary refill brisk  Resp: CTABL, no wheezes, non-labored Abd: SNTND, BS present, no guarding or organomegaly Skin: no rashes, normal turgor  Neuro: no gross deficits.  Psych: fair insight (somewhat limited due to dementia), alert and oriented  Orthostatics:  130/62, pulse 58, O2 sat 98% supine 138/52, pulse 62, O2 sat 98% sitting 118/52, pulse 64, O2 sat 98% standing     Assessment & Plan:   Dementia without behavioral disturbance Agree with patient's wife's decision to discontinue Namenda and Aricept since this medications are not beneficial for long periods of usage and will not change trajectory of his dementia.  Encounter to establish care with new doctor We will reduce patient's hydralazine from BID to daily  dosing giving patient's symptoms of orthostatic hypotension and dizziness.  His diastolic pressure is low, so hopefully reduction of hydralazine dose will help normalize this pressure.  We can discontinue hydralazine if pressures are still within normal limits and patient continues having these symptoms.  I encouraged patient to try to stand up slowly from a seated position to give his body time to adjust.  His sleepiness could be due to his amiodarone, but I will not change his dose since he will be going to a maintenance dose from the loading  dose in two days.  Also, cardiology would be better equipped to address his amiodarone dose, so I will defer to their management of this medication.  Physical deconditioning I encouraged patient and his wife to continue trying to walk a little farther each day as tolerated.  PT and OT will be beneficial to him as well in the coming weeks.    Joe Wiggins, M.D. 02/04/2018, 2:00 PM PGY-1, [place]

## 2018-02-01 NOTE — Patient Instructions (Addendum)
It was a pleasure to see you today! Thank you for choosing [facility] for your primary care. Joe Wiggins was seen for follow up after his stay at rehab.   Our plans for today were:  Take one pill of hydralazine and see if your dizziness gets better.  We can consider stopping this medication if you continue feeling dizzy.  Call cardiology if you would like to change the dose of amiodarone.  I am reassured that you will be decreasing the dose in two days, and hopefully this will help improve your fatigue.  I agree with stopping the donepezil and namenda.  I also encourage you to keep walking every day as you can tolerate it.    You should return to our clinic to see me in about six weeks to check on your progress.  I will be happy to see you earlier if you would like.  Please feel free to call the clinic at [phone] if you have any questions or concerns.  Best,  Dr. Shan Levans  Orthostatic Hypotension Orthostatic hypotension is a sudden drop in blood pressure that happens when you quickly change positions, such as when you get up from a seated or lying position. Blood pressure is a measurement of how strongly, or weakly, your blood is pressing against the walls of your arteries. Arteries are blood vessels that carry blood from your heart throughout your body. When blood pressure is too low, you may not get enough blood to your brain or to the rest of your organs. This can cause weakness, light-headedness, rapid heartbeat, and fainting. This can last for just a few seconds or for up to a few minutes. Orthostatic hypotension is usually not a serious problem. However, if it happens frequently or gets worse, it may be a sign of something more serious. What are the causes? This condition may be caused by:  Sudden changes in posture, such as standing up quickly after you have been sitting or lying down.  Blood loss.  Loss of body fluids (dehydration).  Heart problems.  Hormone  (endocrine) problems.  Pregnancy.  Severe infection.  Lack of certain nutrients.  Severe allergic reactions (anaphylaxis).  Certain medicines, such as blood pressure medicine or medicines that make the body lose excess fluids (diuretics). Sometimes, this condition can be caused by not taking medicine as directed, such as taking too much of a certain medicine.  What increases the risk? Certain factors can make you more likely to develop orthostatic hypotension, including:  Age. Risk increases as you get older.  Conditions that affect the heart or the central nervous system.  Taking certain medicines, such as blood pressure medicine or diuretics.  Being pregnant.  What are the signs or symptoms? Symptoms of this condition may include:  Weakness.  Light-headedness.  Dizziness.  Blurred vision.  Fatigue.  Rapid heartbeat.  Fainting, in severe cases.  How is this diagnosed? This condition is diagnosed based on:  Your medical history.  Your symptoms.  Your blood pressure measurement. Your health care provider will check your blood pressure when you are: ? Lying down. ? Sitting. ? Standing.  A blood pressure reading is recorded as two numbers, such as "120 over 80" (or 120/80). The first ("top") number is called the systolic pressure. It is a measure of the pressure in your arteries as your heart beats. The second ("bottom") number is called the diastolic pressure. It is a measure of the pressure in your arteries when your heart relaxes between  beats. Blood pressure is measured in a unit called mm Hg. Healthy blood pressure for adults is 120/80. If your blood pressure is below 90/60, you may be diagnosed with hypotension. Other information or tests that may be used to diagnose orthostatic hypotension include:  Your other vital signs, such as your heart rate and temperature.  Blood tests.  Tilt table test. For this test, you will be safely secured to a table that  moves you from a lying position to an upright position. Your heart rhythm and blood pressure will be monitored during the test.  How is this treated? Treatment for this condition may include:  Changing your diet. This may involve eating more salt (sodium) or drinking more water.  Taking medicines to raise your blood pressure.  Changing the dosage of certain medicines you are taking that might be lowering your blood pressure.  Wearing compression stockings. These stockings help to prevent blood clots and reduce swelling in your legs.  In some cases, you may need to go to the hospital for:  Fluid replacement. This means you will receive fluids through an IV tube.  Blood replacement. This means you will receive donated blood through an IV tube (transfusion).  Treating an infection or heart problems, if this applies.  Monitoring. You may need to be monitored while medicines that you are taking wear off.  Follow these instructions at home: Eating and drinking   Drink enough fluid to keep your urine clear or pale yellow.  Eat a healthy diet and follow instructions from your health care provider about eating or drinking restrictions. A healthy diet includes: ? Fresh fruits and vegetables. ? Whole grains. ? Lean meats. ? Low-fat dairy products.  Eat extra salt only as directed. Do not add extra salt to your diet unless your health care provider told you to do that.  Eat frequent, small meals.  Avoid standing up suddenly after eating. Medicines  Take over-the-counter and prescription medicines only as told by your health care provider. ? Follow instructions from your health care provider about changing the dosage of your current medicines, if this applies. ? Do not stop or adjust any of your medicines on your own. General instructions  Wear compression stockings as told by your health care provider.  Get up slowly from lying down or sitting positions. This gives your blood  pressure a chance to adjust.  Avoid hot showers and excessive heat as directed by your health care provider.  Return to your normal activities as told by your health care provider. Ask your health care provider what activities are safe for you.  Do not use any products that contain nicotine or tobacco, such as cigarettes and e-cigarettes. If you need help quitting, ask your health care provider.  Keep all follow-up visits as told by your health care provider. This is important. Contact a health care provider if:  You vomit.  You have diarrhea.  You have a fever for more than 2-3 days.  You feel more thirsty than usual.  You feel weak and tired. Get help right away if:  You have chest pain.  You have a fast or irregular heartbeat.  You develop numbness in any part of your body.  You cannot move your arms or your legs.  You have trouble speaking.  You become sweaty or feel lightheaded.  You faint.  You feel short of breath.  You have trouble staying awake.  You feel confused. This information is not intended to replace advice  given to you by your health care provider. Make sure you discuss any questions you have with your health care provider. Document Released: 12/02/2002 Document Revised: 08/30/2016 Document Reviewed: 06/03/2016 Elsevier Interactive Patient Education  2018 [employer].

## 2018-02-02 ENCOUNTER — Telehealth: Payer: Self-pay

## 2018-02-02 NOTE — Telephone Encounter (Signed)
Langley Gauss, RN with [facility], left message on nurse line on 02/01/18 requesting verbal orders for homecare to include PT, OT and skilled nursing. Call back number is [phone]. Danley Danker, RN [facility] RN)

## 2018-02-02 NOTE — Telephone Encounter (Signed)
2nd call received on nurse line at 1341 from [facility] (?) PT with [facility] requesting a call back with verbal orders for PT. Call back number is [phone]. Danley Danker, RN [facility] RN)

## 2018-02-04 DIAGNOSIS — Z7689 Persons encountering health services in other specified circumstances: Secondary | ICD-10-CM | POA: Insufficient documentation

## 2018-02-04 NOTE — Assessment & Plan Note (Signed)
I encouraged patient and his wife to continue trying to walk a little farther each day as tolerated.  PT and OT will be beneficial to him as well in the coming weeks.

## 2018-02-04 NOTE — Assessment & Plan Note (Addendum)
We will reduce patient's hydralazine from BID to daily dosing giving patient's symptoms of orthostatic hypotension and dizziness.  His diastolic pressure is low, so hopefully reduction of hydralazine dose will help normalize this pressure.  We can discontinue hydralazine if pressures are still within normal limits and patient continues having these symptoms.  I encouraged patient to try to stand up slowly from a seated position to give his body time to adjust.  His sleepiness could be due to his amiodarone, but I will not change his dose since he will be going to a maintenance dose from the loading dose in two days.  Also, cardiology would be better equipped to address his amiodarone dose, so I will defer to their management of this medication.

## 2018-02-04 NOTE — Assessment & Plan Note (Signed)
Agree with patient's wife's decision to discontinue Namenda and Aricept since this medications are not beneficial for long periods of usage and will not change trajectory of his dementia.

## 2018-02-05 ENCOUNTER — Telehealth: Payer: Self-pay

## 2018-02-05 ENCOUNTER — Telehealth: Payer: Self-pay | Admitting: Internal Medicine

## 2018-02-05 NOTE — Telephone Encounter (Signed)
Returned call to Pt family.  Pt recently reduced amiodarone to 200 mg daily.  Pt is followed for cardiology by Dr. Wynonia Lawman (unable to see in Epic what he is doing to manage patient).  Requested family member call Dr. Thurman Coyer office and ask about amiodarone.  Did mention it takes a long time for amiodarone to work out of patient system.  Family states trying to make f/u appt with Wynonia Lawman.  Advised to call his office, get further orders on amio and schedule f/u.  If they want to see a cardiologist here, have asked family to call back and we can assist.

## 2018-02-05 NOTE — Telephone Encounter (Signed)
New message     Pt c/o medication issue:  1. Name of Medication:  amiodarone (PACERONE) 400 MG tablet Take 1 tablet (400 mg total) by mouth daily.     2. How are you currently taking this medication (dosage and times per day)? 1 x a day   3. Are you having a reaction (difficulty breathing--STAT)? Yes   4. What is your medication issue? Patient has become wobbly and dizzy when taking this medication is this normal

## 2018-02-05 NOTE — Telephone Encounter (Signed)
Sharyn Lull, OT with [facility] needs verbal orders for the following:  OT 1x/week x 2 weeks and 2x/wk x 3 weeks.   Callback number is [phone] Danley Danker, RN [facility] RN)

## 2018-02-06 DIAGNOSIS — I6523 Occlusion and stenosis of bilateral carotid arteries: Secondary | ICD-10-CM | POA: Diagnosis not present

## 2018-02-06 DIAGNOSIS — I480 Paroxysmal atrial fibrillation: Secondary | ICD-10-CM | POA: Diagnosis not present

## 2018-02-06 DIAGNOSIS — J449 Chronic obstructive pulmonary disease, unspecified: Secondary | ICD-10-CM | POA: Diagnosis not present

## 2018-02-06 DIAGNOSIS — Z7901 Long term (current) use of anticoagulants: Secondary | ICD-10-CM | POA: Diagnosis not present

## 2018-02-06 DIAGNOSIS — Z950 Presence of cardiac pacemaker: Secondary | ICD-10-CM | POA: Diagnosis not present

## 2018-02-06 DIAGNOSIS — N184 Chronic kidney disease, stage 4 (severe): Secondary | ICD-10-CM | POA: Diagnosis not present

## 2018-02-06 DIAGNOSIS — E785 Hyperlipidemia, unspecified: Secondary | ICD-10-CM | POA: Diagnosis not present

## 2018-02-06 DIAGNOSIS — Z955 Presence of coronary angioplasty implant and graft: Secondary | ICD-10-CM | POA: Diagnosis not present

## 2018-02-06 DIAGNOSIS — F0390 Unspecified dementia without behavioral disturbance: Secondary | ICD-10-CM | POA: Diagnosis not present

## 2018-02-06 DIAGNOSIS — I255 Ischemic cardiomyopathy: Secondary | ICD-10-CM | POA: Diagnosis not present

## 2018-02-06 DIAGNOSIS — G4733 Obstructive sleep apnea (adult) (pediatric): Secondary | ICD-10-CM | POA: Diagnosis not present

## 2018-02-06 DIAGNOSIS — I2510 Atherosclerotic heart disease of native coronary artery without angina pectoris: Secondary | ICD-10-CM | POA: Diagnosis not present

## 2018-02-06 DIAGNOSIS — R42 Dizziness and giddiness: Secondary | ICD-10-CM | POA: Diagnosis not present

## 2018-02-07 ENCOUNTER — Telehealth: Payer: Self-pay | Admitting: Family Medicine

## 2018-02-07 ENCOUNTER — Other Ambulatory Visit: Payer: Self-pay

## 2018-02-07 ENCOUNTER — Other Ambulatory Visit: Payer: Self-pay | Admitting: Family Medicine

## 2018-02-07 DIAGNOSIS — E0789 Other specified disorders of thyroid: Secondary | ICD-10-CM

## 2018-02-07 NOTE — Telephone Encounter (Signed)
I am placing an order for ambulatory referral to endocrinology for assessment of this mass.  Please let me know if I can be of further assistance.

## 2018-02-07 NOTE — Patient Outreach (Signed)
[place] [facility]) Care Management  [place]   02/07/2018  Jaramiah Bossard 1946/10/01 [id number]  Initial Transition of care  Referral date: 01/31/18 Referral source: discharged from [facility] on 01/30/18 Insurance: health team advantage  Subjective:  Telephone call to patient for assessment.  HIPAA verified with patient.  Initial telephone assessment completed with patients wife, Vallie Teters.  Wife states patient has not received his yet. Wife states she talked with someone at [facility] and they were waiting to have oxygen approved. Patient states she was made aware that patients oxygen was approved by has not heard back. RNCM advised wife to call [facility] home health today regarding the status of patients oxygen.  Advised wife to notify RNCM if she is unable to resolve this issue.  Wife states patient saw his primary MD on 02/01/18.  States patients primary MD decreased his hydralazine to 1 tablet per day. Wife states patient was seen by his cardiologist on yesterday who discontinued patients hydralazine and aspirin 325mg .  Wife states patient aspirin has been changed to 81 mg 1 time per day.  Wife states patient has still been complaining of dizziness. Wife states she discussed this with patients doctors. Wife reports patients cardiologist changed his defibrillator from 40 to 57 in hopes of decreasing the dizziness symptoms. RNCM discussed fall prevention strategies with wife. Wife states patient uses his walker when he is out but is able to ambulate in the house without assistance. Wife denies patient having any falls within the past year.  Wife states doctor found a mass on patients thyroid when he had his doppler study of his neck done. Wife states patient has not been seen by a specialist. RNCM advised wife to call patients primary MD office and request referral to specialist for patient. Wife verbalized understanding.  Wife states patient has lost  approximately 15 lbs since being in the hospital and skilled nursing facility. Wife states patient was not eating much at that time due to having pneumonia.  Wife states patient's appetite has gotten better. Wife states patient is on a low sodium diet.  RNCM advised patient to notify MD of any changes in condition prior to scheduled appointment. RNCM verified patient aware of 911 services for urgent/ emergent needs.   Objective: see assessment  Current Medications: Current Outpatient Medications  Medication Sig Dispense Refill  . aspirin EC 81 MG tablet Take 81 mg by mouth daily.    Marland Kitchen acetaminophen (TYLENOL) 325 MG tablet Take 650 mg by mouth 3 (three) times daily.    Marland Kitchen albuterol (ACCUNEB) 0.63 MG/3ML nebulizer solution 3 ml inhale orally via nebulizer four times a day    . albuterol (PROVENTIL HFA;VENTOLIN HFA) 108 (90 Base) MCG/ACT inhaler Inhale 2 puffs into the lungs every 6 (six) hours as needed.     Marland Kitchen amiodarone (PACERONE) 400 MG tablet Take 1 tablet (400 mg total) by mouth daily. 30 tablet 0  . apixaban (ELIQUIS) 5 MG TABS tablet Take 5 mg by mouth 2 (two) times daily.    Marland Kitchen aspirin 325 MG tablet Take 1 tablet (325 mg total) by mouth daily. (Patient not taking: Reported on 01/31/2018) 30 tablet 0  . atorvastatin (LIPITOR) 40 MG tablet Take 40 mg by mouth daily.    . Budesonide 90 MCG/ACT inhaler Inhale 1 puff into the lungs 2 (two) times daily. 1 Inhaler 0  . cholecalciferol (VITAMIN D) 1000 units tablet Take 1,000 Units by mouth daily.    Marland Kitchen  Cyanocobalamin (VITAMIN B 12 PO) Take by mouth daily.    . famotidine (PEPCID) 20 MG tablet Take 20 mg by mouth daily.    . ferrous sulfate 325 (65 FE) MG tablet Take 325 mg by mouth daily with breakfast.    . Fluticasone-Salmeterol (ADVAIR) 250-50 MCG/DOSE AEPB Inhale 1 puff into the lungs 2 (two) times daily.    . folic acid (FOLVITE) 353 MCG tablet Take 400 mcg by mouth daily.    . furosemide (LASIX) 40 MG tablet Take 1 tablet (40 mg total) by  mouth daily. 30 tablet 0  . hydrALAZINE (APRESOLINE) 100 MG tablet Take 100 mg by mouth daily.    Marland Kitchen labetalol (NORMODYNE) 200 MG tablet Take 200 mg by mouth 2 (two) times daily.    . Multiple Vitamin (MULTIVITAMIN) tablet Take 1 tablet by mouth daily.    . nitroGLYCERIN (NITROSTAT) 0.4 MG SL tablet Place 0.4 mg under the tongue every 5 (five) minutes as needed for chest pain.    . OXYGEN Place 1 L/min into the nose continuous.    Marland Kitchen tiotropium (SPIRIVA HANDIHALER) 18 MCG inhalation capsule Place 18 mcg into inhaler and inhale daily.    . traMADol (ULTRAM) 50 MG tablet Take 1 tablet (50 mg total) by mouth every 6 (six) hours as needed for moderate pain. 30 tablet 0   No current facility-administered medications for this visit.     Functional Status: In your present state of health, do you have any difficulty performing the following activities: 02/07/2018 12/25/2017  Hearing? [place]? N -  Difficulty concentrating or making decisions? Y -  Comment - -  Walking or climbing stairs? Y -  Comment - -  Dressing or bathing? N -  Doing errands, shopping? Y N  Preparing Food and eating ? Y -  Using the Toilet? N -  In the past six months, have you accidently leaked urine? N -  Do you have problems with loss of bowel control? N -  Managing your Medications? Y -  Managing your Finances? Y -  Housekeeping or managing your Housekeeping? Y -  Some recent data might be hidden    Fall/Depression Screening: Fall Risk  02/01/2018 03/22/2016  Falls in the past year? No No   PHQ 2/9 Scores 02/01/2018  PHQ - 2 Score 0    THN CM Care Plan Problem One     Most Recent Value  Care Plan Problem One  potential for readmission due to recent hospitalization   Role Documenting the Problem One  Care Management Telephonic [place] for Problem One  Active  THN Long Term Goal   patients spouse will report no hospital readmission for patient within 30 days.   THN Long Term Goal  Start Date  01/31/18  Interventions for Problem One Long Term Goal  RNCM advised spouse to have patient take medications as prescribed, keep follow up appointments and report any new symptoms to doctor  [facility] CM Short Term Goal #1   Wife will call Home health agency regarding status update on patients oxygen within 1 week  THN CM Short Term Goal #1 Start Date  02/07/18  Interventions for Short Term Goal #1  RNCM advised wife to call home health agency today regarding patients oxygen.     THN CM Care Plan Problem Two     Most Recent Value  Care Plan Problem Two  Thyroid mass  Role Documenting the Problem Two  Care Management Telephonic Coordinator  Care Plan for Problem Two  Active  THN CM Short Term Goal #1   Wife will contact patients doctor office and request specialist referral for thyroid mass within 2 weeks.   THN CM Short Term Goal #1 Start Date  02/07/18  Interventions for Short Term Goal #2   RNCM advised wife to call doctor to request specialist referral      Assessment:ongoing transition of care follow up  Plan:RNCM will follow up with patient within 1 week.  Wife will follow up with patients doctor regarding referral to specialist for thyroid mass. Wife will contact home health agency regarding status of patients oxygen referral.   Quinn Plowman RN,BSN,CCM [facility] Telephonic  [phone]

## 2018-02-07 NOTE — Telephone Encounter (Signed)
Spoke with patient's wife, Lynelle Smoke, regarding the thyroid mass seen on doppler exam.  She said that this was noticed in December when carotid dopplers were being performed.  I let her know that I placed an ambulatory referral to endocrinology to further investigate this mass and that if she has not heard from anyone regarding this appointment in the next week, she should call our office.

## 2018-02-07 NOTE — Telephone Encounter (Signed)
Doppler found a mass on his thyroid.  Pt is requesting a referral to a specialist to have this checked out. Please advise

## 2018-02-08 ENCOUNTER — Telehealth: Payer: Self-pay | Admitting: *Deleted

## 2018-02-08 NOTE — Telephone Encounter (Signed)
Joe Wiggins wanted to update you from her visit:  1. Cardiology "reset pts pacemaker" 2. Cardiology also dc'd hydroxyzine and changed his ASA from 325mg  to 81mg .  Fleeger, Salome Spotted, CMA

## 2018-02-09 NOTE — Telephone Encounter (Signed)
Spoke with pt wife and gave her the below information. Katharina Caper, April D, [place]

## 2018-02-15 ENCOUNTER — Ambulatory Visit: Payer: Self-pay

## 2018-02-15 ENCOUNTER — Other Ambulatory Visit: Payer: Self-pay

## 2018-02-15 NOTE — Patient Outreach (Signed)
[place] [facility]) Care Management  02/15/2018  Joe Wiggins [birth year] [id number]  Transition of care  Referral date:01/31/18 Referral source:discharged from [facility] on 01/30/18 Insurance:health team advantage Attempt #1  Telephone call to patient regarding transition of care follow up. Unable to reach patient. HIPAA compliant voice message left with call back phone number.  PLAN: RNCM will attempt 2nd telephone call to patient within 3 business days.   Quinn Plowman RN,BSN,CCM [facility] Telephonic  [phone]

## 2018-02-16 ENCOUNTER — Telehealth: Payer: Self-pay

## 2018-02-16 NOTE — Telephone Encounter (Signed)
Joe Wiggins with [facility] called to inform PCP oxygen was delivered to the house today and it was declined by pts wife.  Pt is not on oxygen and his o2 stats are staying around 96-97 on room air.

## 2018-02-16 NOTE — Telephone Encounter (Signed)
Patient wife, Lynelle Smoke, left message on nurse line requesting call back from PCP regarding patient. Danley Danker, RN [facility] RN)

## 2018-02-16 NOTE — Telephone Encounter (Signed)
I am okay with Joe Wiggins not having oxygen if he has appropriate saturations and his wife declines it.  I called his wife to speak with her but had to leave a message.  I will try calling her again tomorrow.  Thank you!

## 2018-02-16 NOTE — Telephone Encounter (Signed)
Will forward to MD to advise. Jazmin Hartsell,CMA  

## 2018-02-17 ENCOUNTER — Telehealth: Payer: Self-pay | Admitting: Family Medicine

## 2018-02-17 NOTE — Telephone Encounter (Signed)
Called patient's wife [name] to check in and ask if she had any questions for me.  She said that Tolbert is doing really well and that she did not have any concerns at this time.  He will need some refills next week and will call the clinic for those.  I will be happy to refill his medications whenever she calls next week.

## 2018-02-19 ENCOUNTER — Other Ambulatory Visit: Payer: Self-pay

## 2018-02-19 NOTE — Patient Outreach (Signed)
[place] [facility]) Care Management  02/19/2018  Bessie Livingood 02-Mar-1946 [id number]   Transition of care  Referral date:01/31/18 Referral source:discharged from [facility] on 01/30/18 Insurance:health team advantage Attempt #2  Telephone call to patient regarding transition of care follow up. Unable to reach patient. HIPAA compliant voice message left with call back phone number.  PLAN: RNCM will send patient outreach letter to attempt contact.   Quinn Plowman RN,BSN,CCM [facility] Telephonic  [phone]

## 2018-02-26 DIAGNOSIS — J449 Chronic obstructive pulmonary disease, unspecified: Secondary | ICD-10-CM | POA: Diagnosis not present

## 2018-02-26 DIAGNOSIS — Z8674 Personal history of sudden cardiac arrest: Secondary | ICD-10-CM | POA: Diagnosis not present

## 2018-02-26 DIAGNOSIS — I480 Paroxysmal atrial fibrillation: Secondary | ICD-10-CM | POA: Diagnosis not present

## 2018-02-26 DIAGNOSIS — Z8701 Personal history of pneumonia (recurrent): Secondary | ICD-10-CM | POA: Diagnosis not present

## 2018-03-02 ENCOUNTER — Other Ambulatory Visit: Payer: Self-pay

## 2018-03-02 NOTE — Patient Outreach (Signed)
[place] [facility]) Care Management  03/02/2018  Lessie Manigo October 03, 1946 [id number]  Transition of care  Referral date:01/31/18 Referral source:discharged from [facility] on 01/30/18 Insurance:health team advantage  Telephone call to patient regarding transition of care follow up. HIPAA verified with patient wife, Bobbie Valletta for patient. Wife states patient is doing much better. Wife report patient did get approved for oxygen but ended up not needing it. Wife states patient has not needed the oxygen since discharge from the hospital therefore they cancelled the order.  Wife states since patients medication hydralazine has been changed and his pacemaker adjusted patient has not had any dizziness or complained of feeling lightheaded.  Wife reports patients appetite, "is really good now."   Wife states she talked with patients primary provider and patient will have a follow up with the doctor on 03/14/18. Wife states at that visit the doctor will make a referral for patient to the endocrinologist.  Wife states, " his only problem right now is that he is getting a stopped up or runny nose at night." Wife states patient has been taking Claritin but it doesn't seem to be working. Wife states she spoke with the pharmacist on yesterday who suggested Flonase nasal spray.  Wife states patient is taking the Flonase and will wait to see if it helps.  RNCM advised wife to notify patients primary MD if patient continues to have symptoms and/ or medication does not work. Wife verbalized understanding.  RNCM advised patient's wife  to notify MD of any changes in patients condition prior to scheduled appointment. Advised that patient should continue to take her medications as advised.  RNCM verified patient aware of 911 services for urgent/ emergent needs.  PLAN: RNCM will follow up with patient and// or wife 3 weeks.   Quinn Plowman RN,BSN,CCM [facility] Telephonic  [phone]

## 2018-03-14 ENCOUNTER — Encounter: Payer: Self-pay | Admitting: Family Medicine

## 2018-03-14 ENCOUNTER — Ambulatory Visit (INDEPENDENT_AMBULATORY_CARE_PROVIDER_SITE_OTHER): Payer: PPO | Admitting: Family Medicine

## 2018-03-14 VITALS — BP 100/50 | HR 89 | Temp 98.7°F | Wt 169.8 lb

## 2018-03-14 DIAGNOSIS — R5381 Other malaise: Secondary | ICD-10-CM | POA: Diagnosis not present

## 2018-03-14 DIAGNOSIS — Z7901 Long term (current) use of anticoagulants: Secondary | ICD-10-CM

## 2018-03-14 DIAGNOSIS — Z0001 Encounter for general adult medical examination with abnormal findings: Secondary | ICD-10-CM

## 2018-03-14 DIAGNOSIS — Z0000 Encounter for general adult medical examination without abnormal findings: Secondary | ICD-10-CM

## 2018-03-14 DIAGNOSIS — J302 Other seasonal allergic rhinitis: Secondary | ICD-10-CM | POA: Diagnosis not present

## 2018-03-14 DIAGNOSIS — E041 Nontoxic single thyroid nodule: Secondary | ICD-10-CM | POA: Diagnosis not present

## 2018-03-14 MED ORDER — CETIRIZINE HCL 10 MG PO TABS: 10.0000 mg | ORAL_TABLET | Freq: Every day | ORAL | 11 refills | Status: DC

## 2018-03-14 NOTE — Assessment & Plan Note (Signed)
Patient uses a CPAP nightly and is on Eliquis, both of which increase his risk for epistaxis.  Wife and patient were shown how to apply pressure to the bridge of the nose for at least five minutes straight to alleviate these episodes.  I told them that I was not surprised that this happened given his CPAP and anticoagulant.

## 2018-03-14 NOTE — Assessment & Plan Note (Signed)
Patient seems to be doing well and has vastly improved from his condition in the hospital two months ago.  His wife provides good care for him as well.  I encouraged continued daily exercise.

## 2018-03-14 NOTE — Assessment & Plan Note (Signed)
Will prescribe Zyrtec today since Claritin seems to be less effective to control patient's allergies.  [name] was advised that she can get it OTC if that is cheaper for her.

## 2018-03-14 NOTE — Assessment & Plan Note (Signed)
Will place another endocrinology referral today.  Advised patient's wife to call the clinic if she has not heard back in about two weeks.

## 2018-03-14 NOTE — Progress Notes (Signed)
Subjective:    Joe Wiggins - 72 y.o. male MRN [id number]  Date of birth: 12-15-1946  HPI  Joe Wiggins is here for follow up after completing home OT and PT.  He and his wife report that he is feeling good overall.  He is walking farther at the mall and is able to care for himself with his wife's supervision.  He did have an episode of epistaxis last night, but it resolved on its own.  His Claritin does not seem to be working for him, and his wife has started giving him Flonase.  She thinks this may have contributed to his epistaxis.He intermittently feels "swimmy headed," but this has improved overall.  His wife says that his weight has increased somewhat lately from 149 lbs to 154 lbs at home and his blood pressure has been stable and normal at home.    Joe Wiggins said that she never received a call regarding their referral to endocrinology for follow up of Joe Wiggins thyroid nodule visualized on his carotid artery ultrasound late last year.  Health Maintenance:  There are no preventive care reminders to display for this patient.  -  reports that he quit smoking about 5 years ago. His smoking use included cigarettes. he has never used smokeless tobacco. - Review of Systems: Per HPI. - Past Medical History: Patient Active Problem List   Diagnosis Date Noted  . Thyroid nodule 03/14/2018  . Seasonal allergies 03/14/2018  . Encounter to establish care with new doctor 02/04/2018  . Pre-syncope 01/16/2018  . Anorexia 01/16/2018  . ICD (implantable cardioverter-defibrillator) in place   . Physical deconditioning   . Acute respiratory failure ([place])   . Cardiac arrest ([place])   . Community acquired pneumonia of right upper lobe of lung ([place])   . HAP (hospital-acquired pneumonia) 12/25/2017  . CAD (coronary artery disease), native coronary artery 01/20/2016  . Long term current use of anticoagulant therapy 01/20/2016  . History of TIA (transient ischemic attack) and stroke 01/20/2016  . Sleep  apnea   . Cardiac pacemaker in situ   . Anemia 01/17/2016  . Bilateral carotid artery stenosis   . Hyperlipidemia   . Dementia without behavioral disturbance   . Hypertensive heart disease without CHF   . Paroxysmal atrial fibrillation (HCC)   . Chronic kidney disease, stage 3 ([place])   . COPD exacerbation (HCC)    - Medications: reviewed and updated   Objective:   Physical Exam BP (!) 100/50 (BP Location: Left Arm, Patient Position: Sitting, Cuff Size: Normal)   Pulse 89   Temp 98.7 F (37.1 C) (Oral)   Wt 169 lb 12.8 oz (77 kg)   SpO2 94%   BMI 24.36 kg/m  Gen: NAD, alert, cooperative with exam, well-appearing, pleasant HEENT: NCAT, PERRL, clear conjunctiva, oropharynx clear, supple neck, no lymphadenopathy CV: RRR, good S1/S2, no murmur, no edema Resp: CTABL, no wheezes, non-labored Abd: SNTND, BS present, no guarding or organomegaly Skin: no rashes, normal turgor  Neuro: no gross deficits.  Psych: cognitive delay apparent on exam        Assessment & Plan:   Physical deconditioning Patient seems to be doing well and has vastly improved from his condition in the hospital two months ago.  His wife provides good care for him as well.  I encouraged continued daily exercise.   Long term current use of anticoagulant therapy Patient uses a CPAP nightly and is on Eliquis, both of which increase his risk for epistaxis.  Wife and patient were shown how to apply pressure to the bridge of the nose for at least five minutes straight to alleviate these episodes.  I told them that I was not surprised that this happened given his CPAP and anticoagulant.  Thyroid nodule Will place another endocrinology referral today.  Advised patient's wife to call the clinic if she has not heard back in about two weeks.   Seasonal allergies Will prescribe Zyrtec today since Claritin seems to be less effective to control patient's allergies.  [name] was advised that she can get it OTC if that  is cheaper for her.    Maia Breslow, M.D. 03/14/2018, 7:23 PM PGY-1, [place]

## 2018-03-14 NOTE — Patient Instructions (Addendum)
It was nice seeing you today Mr. Besecker!  We will place another endocrinology referral today.  I have also ordered Zyrtec so that you can take this instead of Claritin if it works better for you.  I think your current medications are suiting you well right now.  Keep up the good work with your exercising.  I would like to see you in about three months or earlier if you need.  If you have any questions or concerns, please feel free to call the clinic.   Be well,  Dr. Shan Levans

## 2018-03-15 ENCOUNTER — Ambulatory Visit: Payer: Self-pay

## 2018-03-16 ENCOUNTER — Other Ambulatory Visit: Payer: Self-pay

## 2018-03-16 NOTE — Patient Outreach (Signed)
[facility]) Care Management  03/16/2018  Joe Wiggins 17-Jan-1946 [id number]   Telephone assessment: Telephone call to patients wife,  Joe Wiggins.  HIPAA verified for patient by wife.  Wife states patient is doing very well. Denies patient have any problems at this time. Wife states patient is not complained of any dizziness.  States patients appetite is very good.  Wife states patient does have periodic nose bleeds. Wife states patients primary MD is aware of this. States patient saw his primary MD on 03/15/18.  Wife states doctor advised to watch this and if it continues more frequently to contact doctor office. Wife states patients doctor had to resubmit the referral to the endocrinologist office. Wife states this was done and now patient is waiting for call to schedule appointment.  Wife denies any additional problems or concerns at this time. Verbally agreed with closing patient to [facility] care management services at this time.  RNCM confirmed that wife had 24 hour nurse call line number for patient.   RNCM advised to follow up with doctor for any non emergent symptoms. RNCM advised to call 911 for any severe symptoms.  Wife verbalized understanding.  PLAN; RNCM will refer patient to care management assistant to close due to goals being met. RNCM will send patients primary MD closure notification RNCM will send patient closure letter.   Quinn Plowman RN,BSN,CCM [facility] Telephonic  [phone]

## 2018-03-20 ENCOUNTER — Emergency Department (HOSPITAL_COMMUNITY)
Admission: EM | Admit: 2018-03-20 | Discharge: 2018-03-20 | Disposition: A | Discharge status: Home / Self Care | Payer: PPO | Attending: Emergency Medicine | Admitting: Emergency Medicine

## 2018-03-20 ENCOUNTER — Other Ambulatory Visit: Payer: Self-pay

## 2018-03-20 ENCOUNTER — Encounter (HOSPITAL_COMMUNITY): Payer: Self-pay | Admitting: Nurse Practitioner

## 2018-03-20 DIAGNOSIS — N189 Chronic kidney disease, unspecified: Secondary | ICD-10-CM | POA: Insufficient documentation

## 2018-03-20 DIAGNOSIS — R040 Epistaxis: Secondary | ICD-10-CM

## 2018-03-20 DIAGNOSIS — J449 Chronic obstructive pulmonary disease, unspecified: Secondary | ICD-10-CM | POA: Diagnosis not present

## 2018-03-20 DIAGNOSIS — I4891 Unspecified atrial fibrillation: Secondary | ICD-10-CM | POA: Diagnosis not present

## 2018-03-20 DIAGNOSIS — Z7901 Long term (current) use of anticoagulants: Secondary | ICD-10-CM | POA: Insufficient documentation

## 2018-03-20 DIAGNOSIS — I2510 Atherosclerotic heart disease of native coronary artery without angina pectoris: Secondary | ICD-10-CM | POA: Insufficient documentation

## 2018-03-20 DIAGNOSIS — Z87891 Personal history of nicotine dependence: Secondary | ICD-10-CM | POA: Insufficient documentation

## 2018-03-20 DIAGNOSIS — Z79899 Other long term (current) drug therapy: Secondary | ICD-10-CM | POA: Diagnosis not present

## 2018-03-20 DIAGNOSIS — Z95810 Presence of automatic (implantable) cardiac defibrillator: Secondary | ICD-10-CM | POA: Diagnosis not present

## 2018-03-20 DIAGNOSIS — I129 Hypertensive chronic kidney disease with stage 1 through stage 4 chronic kidney disease, or unspecified chronic kidney disease: Secondary | ICD-10-CM | POA: Diagnosis not present

## 2018-03-20 LAB — BASIC METABOLIC PANEL
Anion gap: 10 (ref 5–15)
BUN: 32 mg/dL — ABNORMAL HIGH (ref 6–20)
CO2: 25 mmol/L (ref 22–32)
Calcium: 10 mg/dL (ref 8.9–10.3)
Chloride: 104 mmol/L (ref 101–111)
Creatinine, Ser: 2.09 mg/dL — ABNORMAL HIGH (ref 0.61–1.24)
GFR calc Af Amer: 35 mL/min — ABNORMAL LOW (ref >60)
GFR calc non Af Amer: 30 mL/min — ABNORMAL LOW (ref >60)
Glucose, Bld: 119 mg/dL — ABNORMAL HIGH (ref 65–99)
Potassium: 4.8 mmol/L (ref 3.5–5.1)
Sodium: 139 mmol/L (ref 135–145)

## 2018-03-20 LAB — CBC WITH DIFFERENTIAL/PLATELET
Basophils Absolute: 0 10*3/uL (ref 0.0–0.1)
Basophils Relative: 0 %
Eosinophils Absolute: 0.9 10*3/uL — ABNORMAL HIGH (ref 0.0–0.7)
Eosinophils Relative: 11 %
HCT: 30 % — ABNORMAL LOW (ref 39.0–52.0)
Hemoglobin: 9.6 g/dL — ABNORMAL LOW (ref 13.0–17.0)
Lymphocytes Relative: 10 %
Lymphs Abs: 0.8 10*3/uL (ref 0.7–4.0)
MCH: 30.9 pg (ref 26.0–34.0)
MCHC: 32 g/dL (ref 30.0–36.0)
MCV: 96.5 fL (ref 78.0–100.0)
Monocytes Absolute: 0.6 10*3/uL (ref 0.1–1.0)
Monocytes Relative: 8 %
Neutro Abs: 5.8 10*3/uL (ref 1.7–7.7)
Neutrophils Relative %: 71 %
Platelets: 325 10*3/uL (ref 150–400)
RBC: 3.11 MIL/uL — ABNORMAL LOW (ref 4.22–5.81)
RDW: 15.7 % — ABNORMAL HIGH (ref 11.5–15.5)
WBC: 8.2 10*3/uL (ref 4.0–10.5)

## 2018-03-20 MED ORDER — SILVER NITRATE-POT NITRATE 75-25 % EX MISC
1.0000 | Freq: Once | CUTANEOUS | Status: AC
  Administered 2018-03-20: 1 via TOPICAL
  Filled 2018-03-20: qty 1

## 2018-03-20 NOTE — ED Triage Notes (Addendum)
Patient came to ER for nose bleed. Patient has been having nose bleeds every night about a week now. Usually only bleeds for about 20 to 30 mins and stop. Last night it bleed all night long. Patient states it is still bleeding has is packed with a cotton ball and if he removes it is drips. About a year ago saw a ENT doctor to have it cauterized. Has not seen primary care MD about this yet.

## 2018-03-20 NOTE — Discharge Instructions (Signed)
Should bleeding recur,

## 2018-03-20 NOTE — ED Notes (Signed)
Staff saw the patient leave the department, but Pt returned and reports he talked to his doctor and they want him to be seen.  Pt "undismissed."

## 2018-03-20 NOTE — ED Provider Notes (Signed)
[facility] DEPT Provider Note   CSN: [id number] Arrival date & time: 03/20/18  0919     History   Chief Complaint No chief complaint on file.   HPI Joe Wiggins is a 72 y.o. male.  HPI   Joe Wiggins is a 72 y.o. male, with a history of AF, CAD, COPD, and HTN, presenting to the ED with epistaxis.  Endorses intermittent epistaxis over the last week.  Bleeding from left nare for approximately 20-30 minutes at a time.  He states today he has had intermittent bleeding since 1 AM.  He will hold pressure for a minute or 2, but then bleeding would recur.  No noted active bleeding here in the ED. Patient is compliant with his Eliquis. Denies trauma, dizziness/lightheadedness, nausea/vomiting, fever/chills, headache, vision abnormalities, or any other complaints.   Past Medical History:  Diagnosis Date  . AF (paroxysmal atrial fibrillation) ([place])   . AICD (automatic cardioverter/defibrillator) present   . CAD (coronary artery disease)   . Cardiomyopathy, ischemic   . Carotid artery occlusion   . Chronic kidney disease   . COPD (chronic obstructive pulmonary disease) ([place])   . Dementia without behavioral disturbance   . GERD (gastroesophageal reflux disease)   . Hyperlipidemia   . Hypertension   . Pneumonia   . Sleep apnea   . Stroke ([place])   . TIA (transient ischemic attack)     Patient Active Problem List   Diagnosis Date Noted  . Thyroid nodule 03/14/2018  . Seasonal allergies 03/14/2018  . Encounter to establish care with new doctor 02/04/2018  . Pre-syncope 01/16/2018  . Anorexia 01/16/2018  . ICD (implantable cardioverter-defibrillator) in place   . Physical deconditioning   . Acute respiratory failure ([place])   . Cardiac arrest ([place])   . Community acquired pneumonia of right upper lobe of lung ([place])   . HAP (hospital-acquired pneumonia) 12/25/2017  . CAD (coronary artery disease), native coronary artery 01/20/2016  . Long term current use  of anticoagulant therapy 01/20/2016  . History of TIA (transient ischemic attack) and stroke 01/20/2016  . Sleep apnea   . Cardiac pacemaker in situ   . Anemia 01/17/2016  . Bilateral carotid artery stenosis   . Hyperlipidemia   . Dementia without behavioral disturbance   . Hypertensive heart disease without CHF   . Paroxysmal atrial fibrillation (HCC)   . Chronic kidney disease, stage 3 ([place])   . COPD exacerbation [facility])     Past Surgical History:  Procedure Laterality Date  . CARDIAC PACEMAKER PLACEMENT    . CATARACT EXTRACTION    . CORONARY ARTERY BYPASS GRAFT    . CORONARY STENT PLACEMENT    . EYE SURGERY    . ICD IMPLANT N/A 12/29/2017   Procedure: ICD IMPLANT;  Surgeon: Evans Lance, MD;  Location: [facility] CV LAB;  Service: Cardiovascular;  Laterality: N/A;  . LEFT HEART CATH AND CORS/GRAFTS ANGIOGRAPHY N/A 12/29/2017   Procedure: LEFT HEART CATH AND CORS/GRAFTS ANGIOGRAPHY;  Surgeon: Martinique, Peter M, MD;  Location: [facility] CV LAB;  Service: Cardiovascular;  Laterality: N/A;        Home Medications    Prior to Admission medications   Medication Sig Start Date End Date Taking? Authorizing Provider  acetaminophen (TYLENOL) 325 MG tablet Take 650 mg by mouth 3 (three) times daily. 01/05/18   [provider]  albuterol (ACCUNEB) 0.63 MG/3ML nebulizer solution 3 ml inhale orally via nebulizer four times a day  [provider]  albuterol (PROVENTIL HFA;VENTOLIN HFA) 108 (90 Base) MCG/ACT inhaler Inhale 2 puffs into the lungs every 6 (six) hours as needed.  06/30/16   [provider]  amiodarone (PACERONE) 400 MG tablet Take 1 tablet (400 mg total) by mouth daily. 01/03/18   Kathrene Alu, MD  apixaban (ELIQUIS) 5 MG TABS tablet Take 5 mg by mouth 2 (two) times daily.    [provider]  aspirin EC 81 MG tablet Take 81 mg by mouth daily.    [provider]  atorvastatin (LIPITOR) 40 MG tablet Take 40 mg by mouth daily.     [provider]  Budesonide 90 MCG/ACT inhaler Inhale 1 puff into the lungs 2 (two) times daily. 01/02/18   Kathrene Alu, MD  cetirizine (ZYRTEC) 10 MG tablet Take 1 tablet (10 mg total) by mouth daily. 03/14/18   Kathrene Alu, MD  cholecalciferol (VITAMIN D) 1000 units tablet Take 1,000 Units by mouth daily.    [provider]  Cyanocobalamin (VITAMIN B 12 PO) Take by mouth daily.    [provider]  famotidine (PEPCID) 20 MG tablet Take 20 mg by mouth daily.    [provider]  ferrous sulfate 325 (65 FE) MG tablet Take 325 mg by mouth daily with breakfast. 01/03/18   [provider]  Fluticasone-Salmeterol (ADVAIR) 250-50 MCG/DOSE AEPB Inhale 1 puff into the lungs 2 (two) times daily.    [provider]  folic acid (FOLVITE) 998 MCG tablet Take 400 mcg by mouth daily.    [provider]  furosemide (LASIX) 40 MG tablet Take 1 tablet (40 mg total) by mouth daily. 01/03/18   Kathrene Alu, MD  labetalol (NORMODYNE) 200 MG tablet Take 200 mg by mouth 2 (two) times daily.    [provider]  Multiple Vitamin (MULTIVITAMIN) tablet Take 1 tablet by mouth daily.    [provider]  nitroGLYCERIN (NITROSTAT) 0.4 MG SL tablet Place 0.4 mg under the tongue every 5 (five) minutes as needed for chest pain.    [provider]  OXYGEN Place 1 L/min into the nose continuous.    [provider]  tiotropium (SPIRIVA HANDIHALER) 18 MCG inhalation capsule Place 18 mcg into inhaler and inhale daily.    [provider]  traMADol (ULTRAM) 50 MG tablet Take 1 tablet (50 mg total) by mouth every 6 (six) hours as needed for moderate pain. 01/02/18   Kathrene Alu, MD    Family History Family History  Problem Relation Age of Onset  . Hypertension Mother   . Heart disease Mother   . Hypertension Sister   . Heart disease Sister     Social History Social History   Tobacco Use  . Smoking status:  Former Smoker    Types: Cigarettes    Last attempt to quit: 11/30/2012    Years since quitting: 5.3  . Smokeless tobacco: Never Used  Substance Use Topics  . Alcohol use: No  . Drug use: No     Allergies   Ipratropium; Morphine and related; Vicodin [hydrocodone-acetaminophen]; and Penicillins   Review of Systems Review of Systems  Constitutional: Negative for fever.  HENT: Positive for nosebleeds. Negative for trouble swallowing and voice change.   Eyes: Negative for visual disturbance.  Respiratory: Negative for cough and shortness of breath.   Cardiovascular: Negative for chest pain.  Gastrointestinal: Negative for abdominal pain, nausea and vomiting.  Neurological: Negative for dizziness, light-headedness and  headaches.  All other systems reviewed and are negative.    Physical Exam Updated Vital Signs BP (!) 162/64   Pulse 78   Temp 98.2 F (36.8 C) (Oral)   Resp 15   Ht 5\' 9"  (1.753 m)   Wt 73.9 kg (163 lb)   SpO2 97%   BMI 24.07 kg/m   Physical Exam  Constitutional: He appears well-developed and well-nourished. No distress.  HENT:  Head: Normocephalic and atraumatic.  No active hemorrhage noted on exam.  Patient has a gauze roll in place that has blood in it.  Once this was removed, there is no recurrence of hemorrhage.  Small area of irritation noted to the left septal wall.  No noted septal hematoma.  Nares are patent.  No facial swelling.  Eyes: Conjunctivae are normal.  Neck: Neck supple.  Cardiovascular: Normal rate, regular rhythm, normal heart sounds and intact distal pulses.  Pulmonary/Chest: Effort normal and breath sounds normal. No respiratory distress.  Abdominal: Soft. There is no tenderness. There is no guarding.  Musculoskeletal: He exhibits no edema.  Lymphadenopathy:    He has no cervical adenopathy.  Neurological: He is alert.  Skin: Skin is warm and dry. He is not diaphoretic.  Psychiatric: He has a normal mood and affect. His behavior  is normal.  Nursing note and vitals reviewed.    ED Treatments / Results  Labs (all labs ordered are listed, but only abnormal results are displayed) Labs Reviewed  BASIC METABOLIC PANEL - Abnormal; Notable for the following components:      Result Value   Glucose, Bld 119 (*)    BUN 32 (*)    Creatinine, Ser 2.09 (*)    GFR calc non Af Amer 30 (*)    GFR calc Af Amer 35 (*)    All other components within normal limits  CBC WITH DIFFERENTIAL/PLATELET - Abnormal; Notable for the following components:   RBC 3.11 (*)    Hemoglobin 9.6 (*)    HCT 30.0 (*)    RDW 15.7 (*)    Eosinophils Absolute 0.9 (*)    All other components within normal limits    EKG None  Radiology No results found.  Procedures Procedures (including critical care time)  Medications Ordered in ED Medications  silver nitrate applicators applicator 1 Stick (1 Stick Topical Given by Other 03/20/18 1538)     Initial Impression / Assessment and Plan / ED Course  I have reviewed the triage vital signs and the nursing notes.  Pertinent labs & imaging results that were available during my care of the patient were reviewed by me and considered in my medical decision making (see chart for details).  Clinical Course as of Mar 21 1599  Tue Mar 20, 2018  1533 Appears to be consistent with previous values.  Hemoglobin(!): 9.6 [SJ]    Clinical Course User Index [SJ] Gabbi Whetstone C, PA-C    Patient presents with epistaxis.  No active hemorrhage during ED course.  Lab results encouraging.  ENT follow-up. The patient was given instructions for home care as well as return precautions. Patient voices understanding of these instructions, accepts the plan, and is comfortable with discharge.  Findings and plan of care discussed with Julianne Rice, MD. Dr. Lita Mains personally evaluated and examined this patient.     Final Clinical Impressions(s) / ED Diagnoses   Final diagnoses:  Epistaxis    ED Discharge  Orders    None       Jvon Meroney  Loletha Grayer, PA-C 03/20/18 1601    Julianne Rice, MD 03/20/18 [phone]

## 2018-03-21 DIAGNOSIS — Z7901 Long term (current) use of anticoagulants: Secondary | ICD-10-CM | POA: Diagnosis not present

## 2018-03-21 DIAGNOSIS — R040 Epistaxis: Secondary | ICD-10-CM | POA: Diagnosis not present

## 2018-03-21 DIAGNOSIS — Z87891 Personal history of nicotine dependence: Secondary | ICD-10-CM | POA: Diagnosis not present

## 2018-03-23 DIAGNOSIS — R040 Epistaxis: Secondary | ICD-10-CM | POA: Diagnosis not present

## 2018-03-23 DIAGNOSIS — Z87891 Personal history of nicotine dependence: Secondary | ICD-10-CM | POA: Diagnosis not present

## 2018-03-23 DIAGNOSIS — Z7901 Long term (current) use of anticoagulants: Secondary | ICD-10-CM | POA: Diagnosis not present

## 2018-03-25 ENCOUNTER — Encounter: Payer: Self-pay | Admitting: Internal Medicine

## 2018-03-25 DIAGNOSIS — F0150 Vascular dementia without behavioral disturbance: Secondary | ICD-10-CM | POA: Insufficient documentation

## 2018-03-25 DIAGNOSIS — I951 Orthostatic hypotension: Secondary | ICD-10-CM | POA: Insufficient documentation

## 2018-03-25 DIAGNOSIS — R001 Bradycardia, unspecified: Secondary | ICD-10-CM | POA: Insufficient documentation

## 2018-03-25 DIAGNOSIS — Z8674 Personal history of sudden cardiac arrest: Secondary | ICD-10-CM | POA: Insufficient documentation

## 2018-03-25 NOTE — Progress Notes (Signed)
Patient ID: Joe Wiggins, male   DOB: 1946-02-13, 72 y.o.   MRN: [id number]  Location:  [facility]   Place of Service:  SNF (31) Provider:  DR Farrel Demark, MD  Patient Care Team: Kathrene Alu, MD as PCP - General (Family Medicine) Jacolyn Reedy, MD as Consulting Physician (Cardiology) Charlaine Dalton, MD as Referring Physician  Extended Emergency Contact Information Primary Emergency Contact: [place] Phone: [phone] Relation: Spouse  Code Status:   Goals of care: Advanced Directive information Advanced Directives 03/14/2018  Does Patient Have a Medical Advance Directive? Yes  Type of Advance Directive -  Does patient want to make changes to medical advance directive? No - Patient declined  Copy of [place] in Chart? -  Would patient like information on creating a medical advance directive? -  Pre-existing out of facility DNR order (yellow form or pink MOST form) -     Chief Complaint  Patient presents with  . Acute Visit    lethargy    HPI:  Pt is a 72 y.o. male seen today for an acute visit for lethargy. Pt with orthostasic sx's of lightheadedness when change positions. No N/V. No f/c. He was seen 01/16/18 for near syncope. toprol d/c'd but pt still taking med. No LOC. He is a poor historian due to dementia. Hx obtained from chart.  Cr 2.1; K 4.7; Na 141; Hgb 9.9; alk phos 218   Past Medical History:  Diagnosis Date  . AF (paroxysmal atrial fibrillation) ([place])   . AICD (automatic cardioverter/defibrillator) present   . CAD (coronary artery disease)   . Cardiomyopathy, ischemic   . Carotid artery occlusion   . Chronic kidney disease   . COPD (chronic obstructive pulmonary disease) ([place])   . Dementia without behavioral disturbance   . GERD (gastroesophageal reflux disease)   . Hyperlipidemia   . Hypertension   . Pneumonia   . Sleep apnea   . Stroke ([place])     . TIA (transient ischemic attack)    Past Surgical History:  Procedure Laterality Date  . CARDIAC PACEMAKER PLACEMENT    . CATARACT EXTRACTION    . CORONARY ARTERY BYPASS GRAFT    . CORONARY STENT PLACEMENT    . EYE SURGERY    . ICD IMPLANT N/A 12/29/2017   Procedure: ICD IMPLANT;  Surgeon: Evans Lance, MD;  Location: [place] [facility];  Service: Cardiovascular;  Laterality: N/A;  . LEFT HEART CATH AND CORS/GRAFTS ANGIOGRAPHY N/A 12/29/2017   Procedure: LEFT HEART CATH AND CORS/GRAFTS ANGIOGRAPHY;  Surgeon: Martinique, Peter M, MD;  Location: [place] [facility];  Service: Cardiovascular;  Laterality: N/A;    Allergies  Allergen Reactions  . Ipratropium Other (See Comments)  . Morphine And Related Nausea Only  . Vicodin [Hydrocodone-Acetaminophen] Other (See Comments)    Hallucinations   . Penicillins Rash    Outpatient Encounter Medications as of 01/25/2018  Medication Sig  . acetaminophen (TYLENOL) 325 MG tablet Take 650 mg by mouth 3 (three) times daily.  Marland Kitchen albuterol (ACCUNEB) 0.63 MG/3ML nebulizer solution 3 ml inhale orally via nebulizer four times a day  . albuterol (PROVENTIL HFA;VENTOLIN HFA) 108 (90 Base) MCG/ACT inhaler Inhale 2 puffs into the lungs every 6 (six) hours as needed.   Marland Kitchen amiodarone (PACERONE) 400 MG tablet Take 1 tablet (400 mg total) by mouth daily.  Marland Kitchen apixaban (ELIQUIS) 5 MG TABS tablet Take 5  mg by mouth 2 (two) times daily.  Marland Kitchen atorvastatin (LIPITOR) 40 MG tablet Take 40 mg by mouth daily.  . Budesonide 90 MCG/ACT inhaler Inhale 1 puff into the lungs 2 (two) times daily.  . cholecalciferol (VITAMIN D) 1000 units tablet Take 1,000 Units by mouth daily.  . Cyanocobalamin (VITAMIN B 12 PO) Take by mouth daily.  . famotidine (PEPCID) 20 MG tablet Take 20 mg by mouth daily.  . ferrous sulfate 325 (65 FE) MG tablet Take 325 mg by mouth daily with breakfast.  . Fluticasone-Salmeterol (ADVAIR) 250-50 MCG/DOSE AEPB Inhale 1 puff into the lungs 2 (two) times daily.   . folic acid (FOLVITE) 497 MCG tablet Take 400 mcg by mouth daily.  . furosemide (LASIX) 40 MG tablet Take 1 tablet (40 mg total) by mouth daily.  Marland Kitchen labetalol (NORMODYNE) 200 MG tablet Take 200 mg by mouth 2 (two) times daily.  . Multiple Vitamin (MULTIVITAMIN) tablet Take 1 tablet by mouth daily.  . nitroGLYCERIN (NITROSTAT) 0.4 MG SL tablet Place 0.4 mg under the tongue every 5 (five) minutes as needed for chest pain.  . OXYGEN Place 1 L/min into the nose continuous.  Marland Kitchen tiotropium (SPIRIVA HANDIHALER) 18 MCG inhalation capsule Place 18 mcg into inhaler and inhale daily.  . traMADol (ULTRAM) 50 MG tablet Take 1 tablet (50 mg total) by mouth every 6 (six) hours as needed for moderate pain.  . [DISCONTINUED] aspirin 325 MG tablet Take 1 tablet (325 mg total) by mouth daily. (Patient not taking: Reported on 01/31/2018)  . [DISCONTINUED] donepezil (ARICEPT) 23 MG TABS tablet Take 23 mg by mouth daily.  . [DISCONTINUED] hydrALAZINE (APRESOLINE) 100 MG tablet Take 100 mg by mouth daily.  . [DISCONTINUED] memantine (NAMENDA XR) 7 MG CP24 24 hr capsule Take 1 capsule (7 mg total) by mouth daily.  . [DISCONTINUED] metoprolol succinate (TOPROL-XL) 25 MG 24 hr tablet Take 25 mg by mouth daily.  . [DISCONTINUED] mirtazapine (REMERON) 7.5 MG tablet Take 7.5 mg by mouth at bedtime. X 14 days   No facility-administered encounter medications on file as of 01/25/2018.     Review of Systems  Unable to perform ROS: Dementia    Immunization History  Administered Date(s) Administered  . Influenza-Unspecified 08/22/2017  . PPD Test 01/03/2018  . Pneumococcal Conjugate-13 08/14/2017  . Pneumococcal Polysaccharide-23 06/26/2013  . Zoster 11/07/2014   Pertinent  Health Maintenance Due  Topic Date Due  . COLONOSCOPY  01/03/2019 (Originally 05/06/1995)  . INFLUENZA VACCINE  Completed  . PNA vac Low Risk Adult  Completed   Fall Risk  02/01/2018 03/22/2016  Falls in the past year? No No   Functional Status  Survey:    Vitals:   01/25/18 1514  BP: 138/60  Pulse: (!) 58  Temp: (!) 95.9 F (35.5 C)  SpO2: 95%  Weight: 152 lb 12.8 oz (69.3 kg)   Body mass index is 23.23 kg/m. Physical Exam  Constitutional: He appears well-developed.  Frail appearing in NAD  HENT:  MMM; no oral thrush  Eyes: Pupils are equal, round, and reactive to light. No scleral icterus.  Neck: Neck supple.  Cardiovascular: Regular rhythm, normal heart sounds and intact distal pulses. Bradycardia present. Exam reveals no gallop and no friction rub.  No murmur heard. No LE edema b/l. No calf TTP  Pulmonary/Chest: No respiratory distress. He has no wheezes. He has no rales. He exhibits no tenderness.  Left ACW AICD palpable  Musculoskeletal: He exhibits edema.  Lymphadenopathy:    He  has no cervical adenopathy.  Neurological: He is alert.  Skin: Skin is warm and dry. No rash noted.  Psychiatric: He has a normal mood and affect. His behavior is normal.    Labs reviewed: Recent Labs    12/30/17 0339 12/31/17 0238 01/01/18 0249 01/02/18 0241 01/10/18 03/20/18 1434  NA 137 134* 137 138 141 139  K 3.8 3.5 3.9 3.6 4.7 4.8  CL 104 103 107 105  --  104  CO2 _0 21*  --  25  GLUCOSE 117* 111* 104* 98  --  119*  BUN 36* 35* 34* 37* 17 32*  CREATININE 1.97* 2.13* 2.00* 2.19* 2.1* 2.09*  CALCIUM 9.5 9.6 9.4 9.3  --  10.0  MG 2.2 2.1 2.1  --   --   --   PHOS  --  3.7 3.6  --   --   --    Recent Labs    12/25/17 1113 01/10/18  AST 24 21  ALT 16* 15  ALKPHOS 120 218*  BILITOT 0.8  --   PROT 7.1  --   ALBUMIN 3.7  --    Recent Labs    12/31/17 0238 01/01/18 0249 01/10/18 03/20/18 1434  WBC 8.4 7.6 6.6 8.2  NEUTROABS 6.3 5.4 5 5.8  HGB 8.8* 8.3* 9.9* 9.6*  HCT 26.7* 24.3* 28* 30.0*  MCV 97.4 99.6  --  96.5  PLT 259 238 335 325   Lab Results  Component Value Date   TSH 1.980 12/26/2017   Lab Results  Component Value Date   HGBA1C 5.6 12/26/2017   Lab Results  Component Value Date    CHOL 146 12/26/2017   HDL 32 (L) 12/26/2017   LDLCALC 81 12/26/2017   TRIG 166 (H) 12/26/2017   CHOLHDL 4.6 12/26/2017    Significant Diagnostic Results in last 30 days:  No results found.  Assessment/Plan   ICD-10-CM   1. Orthostatic hypotension I95.1   2. Bradycardia R00.1   3. History of cardiac arrest Z86.74   4. 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 Perlie Gold  [facility] [address] [place], [place] [address] [phone] Cell (Monday-Friday 8 AM - 5 PM) [phone] After 5 PM and follow prompts

## 2018-03-27 ENCOUNTER — Encounter: Payer: Self-pay | Admitting: Internal Medicine

## 2018-04-03 ENCOUNTER — Ambulatory Visit: Payer: PPO | Admitting: Internal Medicine

## 2018-04-03 ENCOUNTER — Encounter: Payer: Self-pay | Admitting: Internal Medicine

## 2018-04-03 VITALS — BP 128/68 | HR 68 | Ht 68.0 in | Wt 166.2 lb

## 2018-04-03 DIAGNOSIS — I469 Cardiac arrest, cause unspecified: Secondary | ICD-10-CM | POA: Diagnosis not present

## 2018-04-03 DIAGNOSIS — Z95810 Presence of automatic (implantable) cardiac defibrillator: Secondary | ICD-10-CM | POA: Diagnosis not present

## 2018-04-03 LAB — CUP PACEART INCLINIC DEVICE CHECK
Battery Remaining Longevity: 72 mo
Brady Statistic RA Percent Paced: 99.78 %
Brady Statistic RV Percent Paced: 0.26 %
Date Time Interrogation Session: 20190409105216
HighPow Impedance: 68.625
Implantable Lead Implant Date: 20140217
Implantable Lead Implant Date: 20190104
Implantable Lead Location: 753859
Implantable Lead Location: 753860
Implantable Lead Model: 7122
Implantable Pulse Generator Implant Date: 20190104
Lead Channel Impedance Value: 350 Ohm
Lead Channel Impedance Value: 737.5 Ohm
Lead Channel Pacing Threshold Amplitude: 0.75 V
Lead Channel Pacing Threshold Amplitude: 0.75 V
Lead Channel Pacing Threshold Amplitude: 1.25 V
Lead Channel Pacing Threshold Amplitude: 1.25 V
Lead Channel Pacing Threshold Pulse Width: 0.5 ms
Lead Channel Pacing Threshold Pulse Width: 0.5 ms
Lead Channel Pacing Threshold Pulse Width: 0.8 ms
Lead Channel Pacing Threshold Pulse Width: 0.8 ms
Lead Channel Sensing Intrinsic Amplitude: 0.6 mV
Lead Channel Sensing Intrinsic Amplitude: 12 mV
Lead Channel Setting Pacing Amplitude: 2.5 V
Lead Channel Setting Pacing Amplitude: 3.5 V
Lead Channel Setting Pacing Pulse Width: 0.5 ms
Lead Channel Setting Sensing Sensitivity: 0.5 mV
Pulse Gen Serial Number: 9777027

## 2018-04-03 MED ORDER — AMIODARONE HCL 200 MG PO TABS: ORAL_TABLET | ORAL | 3 refills | Status: DC

## 2018-04-03 NOTE — Progress Notes (Signed)
HPI Mr. Agcaoili returns today for followup. He underwent upgrade to a DDD ICD from a DDD PM several months ago. He feels well except for some dizziness. No chest pain or sob. No edema. He has easy bruisability in his arms. He is on systemic anti-coagulation and amio for atrial fib. He has sinus node dysfunction.  Allergies  Allergen Reactions  . Ipratropium Other (See Comments)  . Morphine And Related Nausea Only  . Vicodin [Hydrocodone-Acetaminophen] Other (See Comments)    Hallucinations   . Penicillins Rash     Current Outpatient Medications  Medication Sig Dispense Refill  . albuterol (ACCUNEB) 0.63 MG/3ML nebulizer solution 3 ml inhale orally via nebulizer four times a day    . albuterol (PROVENTIL HFA;VENTOLIN HFA) 108 (90 Base) MCG/ACT inhaler Inhale 2 puffs into the lungs every 6 (six) hours as needed.     Marland Kitchen amiodarone (PACERONE) 200 MG tablet Take 200 mg by mouth daily.    Marland Kitchen apixaban (ELIQUIS) 5 MG TABS tablet Take 5 mg by mouth 2 (two) times daily.    Marland Kitchen atorvastatin (LIPITOR) 40 MG tablet Take 40 mg by mouth daily.    . Budesonide 90 MCG/ACT inhaler Inhale 1 puff into the lungs 2 (two) times daily. 1 Inhaler 0  . cholecalciferol (VITAMIN D) 1000 units tablet Take 1,000 Units by mouth daily.    . Cyanocobalamin (VITAMIN B 12 PO) Take by mouth daily.    . famotidine (PEPCID) 20 MG tablet Take 20 mg by mouth daily.    . ferrous sulfate 325 (65 FE) MG tablet Take 325 mg by mouth daily with breakfast.    . Fluticasone-Salmeterol (ADVAIR) 250-50 MCG/DOSE AEPB Inhale 1 puff into the lungs 2 (two) times daily.    . folic acid (FOLVITE) 128 MCG tablet Take 400 mcg by mouth daily.    . furosemide (LASIX) 40 MG tablet Take 1 tablet (40 mg total) by mouth daily. 30 tablet 0  . labetalol (NORMODYNE) 200 MG tablet Take 200 mg by mouth 2 (two) times daily.    . Multiple Vitamin (MULTIVITAMIN) tablet Take 1 tablet by mouth daily.    . nitroGLYCERIN (NITROSTAT) 0.4 MG SL tablet Place  0.4 mg under the tongue every 5 (five) minutes as needed for chest pain.    Marland Kitchen tiotropium (SPIRIVA HANDIHALER) 18 MCG inhalation capsule Place 18 mcg into inhaler and inhale daily.     No current facility-administered medications for this visit.      Past Medical History:  Diagnosis Date  . AF (paroxysmal atrial fibrillation) ([place])   . AICD (automatic cardioverter/defibrillator) present   . CAD (coronary artery disease)   . Cardiomyopathy, ischemic   . Carotid artery occlusion   . Chronic kidney disease   . COPD (chronic obstructive pulmonary disease) ([place])   . Dementia without behavioral disturbance   . GERD (gastroesophageal reflux disease)   . Hyperlipidemia   . Hypertension   . Pneumonia   . Sleep apnea   . Stroke ([place])   . TIA (transient ischemic attack)     ROS:   All systems reviewed and negative except as noted in the HPI.   Past Surgical History:  Procedure Laterality Date  . CARDIAC PACEMAKER PLACEMENT    . CATARACT EXTRACTION    . CORONARY ARTERY BYPASS GRAFT    . CORONARY STENT PLACEMENT    . EYE SURGERY    . ICD IMPLANT N/A 12/29/2017   Procedure: ICD IMPLANT;  Surgeon: Lovena Le,  Champ Mungo, MD;  Location: [place] [facility];  Service: Cardiovascular;  Laterality: N/A;  . LEFT HEART CATH AND CORS/GRAFTS ANGIOGRAPHY N/A 12/29/2017   Procedure: LEFT HEART CATH AND CORS/GRAFTS ANGIOGRAPHY;  Surgeon: Martinique, Peter M, MD;  Location: [place] [facility];  Service: Cardiovascular;  Laterality: N/A;     Family History  Problem Relation Age of Onset  . Hypertension Mother   . Heart disease Mother   . Hypertension Sister   . Heart disease Sister      Social History   Socioeconomic History  . Marital status: Married    Spouse name: Not on file  . Number of children: Not on file  . Years of education: Not on file  . Highest education level: Not on file  Occupational History  . Not on file  Social Needs  . Financial resource strain: Not on file  . Food  insecurity:    Worry: Not on file    Inability: Not on file  . Transportation needs:    Medical: Not on file    Non-medical: Not on file  Tobacco Use  . Smoking status: Former Smoker    Types: Cigarettes    Last attempt to quit: 11/30/2012    Years since quitting: 5.3  . Smokeless tobacco: Never Used  Substance and Sexual Activity  . Alcohol use: No  . Drug use: No  . Sexual activity: Not on file  Lifestyle  . Physical activity:    Days per week: Not on file    Minutes per session: Not on file  . Stress: Not on file  Relationships  . Social connections:    Talks on phone: Not on file    Gets together: Not on file    Attends religious service: Not on file    Active member of club or organization: Not on file    Attends meetings of clubs or organizations: Not on file    Relationship status: Not on file  . Intimate partner violence:    Fear of current or ex partner: Not on file    Emotionally abused: Not on file    Physically abused: Not on file    Forced sexual activity: Not on file  Other Topics Concern  . Not on file  Social History Narrative  . Not on file     BP 128/68   Pulse 68   Ht 5\' 8"  (1.727 m)   Wt 166 lb 3.2 oz (75.4 kg)   SpO2 98%   BMI 25.27 kg/m   Physical Exam:  Well appearing 72 yo man, NAD HEENT: Unremarkable Neck:  6 cm JVD, no thyromegally Lymphatics:  No adenopathy Back:  No CVA tenderness Lungs:  Clear with no wheezes HEART:  Regular rate rhythm, no murmurs, no rubs, no clicks Abd:  soft, positive bowel sounds, no organomegally, no rebound, no guarding Ext:  2 plus pulses, no edema, no cyanosis, no clubbing Skin:  No rashes no nodules Neuro:  CN II through XII intact, motor grossly intact  EKG - NSR with atrial pacing, LVH  DEVICE  Normal device function.  See PaceArt for details.   Assess/Plan: 1. PAF - he is maintaining NSR on amio. I have asked him to not take any amio on Sunday 2. Chronic systolic heart failure - his  symptoms are class 2. He will continue his current meds. 3. Dizziness - this is new and may be due to his amio. I have asked that he check his  blood pressure when he feels dizzy. 4. Sinus node dysfunction - he is asymptomatic.  5. ICD - his [employer] DDD ICD is working normally. Will recheck in several months.  Mikle Bosworth.D.

## 2018-04-03 NOTE — Patient Instructions (Addendum)
Medication Instructions:  Your physician has recommended you make the following change in your medication: 1.  Decrease your amiodarone 200 mg tablets.  Take one tablet by mouth daily Monday through Saturday.  Do NOT take your amiodarone on Sunday.  Labwork: None ordered.  Testing/Procedures: None ordered.  Follow-Up: Your physician wants you to follow-up in: one year with Dr. Lovena Le.   You will receive a reminder letter in the mail two months in advance. If you don't receive a letter, please call our office to schedule the follow-up appointment.  Remote monitoring is used to monitor your ICD from home. This monitoring reduces the number of office visits required to check your device to one time per year. It allows [place] to keep an eye on the functioning of your device to ensure it is working properly. You are scheduled for a device check from home on 07/03/2018. You may send your transmission at any time that day. If you have a wireless device, the transmission will be sent automatically. After your physician reviews your transmission, you will receive a postcard with your next transmission date.  Any Other Special Instructions Will Be Listed Below (If Applicable).  If you need a refill on your cardiac medications before your next appointment, please call your pharmacy.

## 2018-05-15 DIAGNOSIS — I480 Paroxysmal atrial fibrillation: Secondary | ICD-10-CM | POA: Diagnosis not present

## 2018-05-15 DIAGNOSIS — G4733 Obstructive sleep apnea (adult) (pediatric): Secondary | ICD-10-CM | POA: Diagnosis not present

## 2018-05-15 DIAGNOSIS — F0390 Unspecified dementia without behavioral disturbance: Secondary | ICD-10-CM | POA: Diagnosis not present

## 2018-05-15 DIAGNOSIS — J449 Chronic obstructive pulmonary disease, unspecified: Secondary | ICD-10-CM | POA: Diagnosis not present

## 2018-05-15 DIAGNOSIS — Z7901 Long term (current) use of anticoagulants: Secondary | ICD-10-CM | POA: Diagnosis not present

## 2018-05-15 DIAGNOSIS — Z8673 Personal history of transient ischemic attack (TIA), and cerebral infarction without residual deficits: Secondary | ICD-10-CM | POA: Diagnosis not present

## 2018-05-15 DIAGNOSIS — E785 Hyperlipidemia, unspecified: Secondary | ICD-10-CM | POA: Diagnosis not present

## 2018-05-15 DIAGNOSIS — I2510 Atherosclerotic heart disease of native coronary artery without angina pectoris: Secondary | ICD-10-CM | POA: Diagnosis not present

## 2018-05-15 DIAGNOSIS — I119 Hypertensive heart disease without heart failure: Secondary | ICD-10-CM | POA: Diagnosis not present

## 2018-05-15 DIAGNOSIS — I6523 Occlusion and stenosis of bilateral carotid arteries: Secondary | ICD-10-CM | POA: Diagnosis not present

## 2018-05-15 DIAGNOSIS — N184 Chronic kidney disease, stage 4 (severe): Secondary | ICD-10-CM | POA: Diagnosis not present

## 2018-05-15 DIAGNOSIS — I255 Ischemic cardiomyopathy: Secondary | ICD-10-CM | POA: Diagnosis not present

## 2018-05-23 ENCOUNTER — Encounter: Payer: Self-pay | Admitting: Family Medicine

## 2018-05-23 ENCOUNTER — Ambulatory Visit (INDEPENDENT_AMBULATORY_CARE_PROVIDER_SITE_OTHER): Payer: PPO | Admitting: Family Medicine

## 2018-05-23 ENCOUNTER — Other Ambulatory Visit: Payer: Self-pay

## 2018-05-23 DIAGNOSIS — R197 Diarrhea, unspecified: Secondary | ICD-10-CM

## 2018-05-23 NOTE — Assessment & Plan Note (Signed)
New onset for 4 days.  No signs of focal abdominal lesions or significant dehydration or systemic involvement.  Likely transient viral or ingestion related.  See after visit summary for plan

## 2018-05-23 NOTE — Patient Instructions (Signed)
It was good to see you today.  Thank you for coming in.  I think you have a mild stomach flu or ate something that did agree with your.      You should be better in 5 days   If you are not better by then or if you have fever, or abdomen pain, or bleeding or lots of diarrhea  then come back to see [place]  Things to do to help you feel better Drink plenty of fluids  Be Well

## 2018-05-23 NOTE — Progress Notes (Signed)
Subjective  Joe Wiggins is a 72 y.o. male is presenting with the following  DIARRHEA  Having diarrhea for 3-4 days Progression: had several times Sunday/Monday then none Tuesday and then once this AM Does diarrhea wake patient: no Medications tried: none Patient believes might be causing their pain: traveled to [place] may have caught something  Sick contacts: not known Ingested suspicious foods: no Antibiotics recently: no Immunocompromised: no but did recently start hydralzine 2 weeks ago for blood pressure   Symptoms Vomiting: once [place] Abdominal pain: no Weight Loss: no Decreased urine output: no Lightheadedness: mild when stands up has had this since started hydralazine Fever: no Bloody stools: no  ROS see HPI Smoking Status noted  Chief Complaint noted Review of Symptoms - see HPI PMH - Smoking status noted.    Objective Vital Signs reviewed BP 138/76   Pulse 68   Temp 98.2 F (36.8 C) (Oral)   Ht 5\' 8"  (1.727 m)   Wt 167 lb 9.6 oz (76 kg)   SpO2 93%   BMI 25.48 kg/m  Alert oriented Abdomen: soft and non-tender without masses, organomegaly or hernias noted.  No guarding or rebound Lungs:  Normal respiratory effort, chest expands symmetrically. Lungs are clear to auscultation, no crackles or wheezes. Heart - Regular rate and rhythm.  No murmurs, gallops or rubs.    Able to get up and down from exam table without assistance  Assessments/Plans  See after visit summary for details of patient instuctions  Diarrhea New onset for 4 days.  No signs of focal abdominal lesions or significant dehydration or systemic involvement.  Likely transient viral or ingestion related.  See after visit summary for plan

## 2018-06-05 DIAGNOSIS — G4733 Obstructive sleep apnea (adult) (pediatric): Secondary | ICD-10-CM | POA: Diagnosis not present

## 2018-06-13 ENCOUNTER — Ambulatory Visit: Payer: PPO | Admitting: Family Medicine

## 2018-06-24 NOTE — Progress Notes (Signed)
Patient ID: Joe Wiggins, male   DOB: April 18, 1946, 72 y.o.   MRN: [id number]             Reason for Appointment: Evaluation of thyroid nodule    History of Present Illness:   The patient is being referred by her PCP  The patient's thyroid nodule was first discovered probably in 12/18 incidentally when having a carotid ultrasound This showed a left-sided vascular thyroid mass but no further evaluation or details are available He has not had a thyroid ultrasound  Patient does not think he has any difficulty swallowing, no local pressure sensation in the neck or choking  He has not been noticed to have a goiter in the past on exam's  He has had regular thyroid functions checked including in 5/19 especially since starting amiodarone earlier this year   Lab Results  Component Value Date   TSH 1.980 12/26/2017   TSH 0.574 01/17/2016    Allergies as of 06/25/2018      Reactions   Ipratropium Other (See Comments)   Morphine And Related Nausea Only   Vicodin [hydrocodone-acetaminophen] Other (See Comments)   Hallucinations   Penicillins Rash      Medication List        Accurate as of 06/25/18  4:46 PM. Always use your most recent med list.          albuterol 0.63 MG/3ML nebulizer solution Commonly known as:  ACCUNEB 3 ml inhale orally via nebulizer four times a day   albuterol 108 (90 Base) MCG/ACT inhaler Commonly known as:  PROVENTIL HFA;VENTOLIN HFA Inhale 2 puffs into the lungs every 6 (six) hours as needed.   amiodarone 200 MG tablet Commonly known as:  PACERONE Take one tablet by mouth daily Monday through Saturday.  DO NOT take on Sunday.   atorvastatin 40 MG tablet Commonly known as:  LIPITOR Take 40 mg by mouth daily.   Budesonide 90 MCG/ACT inhaler Inhale 1 puff into the lungs 2 (two) times daily.   cholecalciferol 1000 units tablet Commonly known as:  VITAMIN D Take 1,000 Units by mouth daily.   ELIQUIS 5 MG Tabs tablet Generic drug:  apixaban Take 5  mg by mouth 2 (two) times daily.   famotidine 20 MG tablet Commonly known as:  PEPCID Take 20 mg by mouth daily.   ferrous sulfate 325 (65 FE) MG tablet Take 325 mg by mouth daily with breakfast.   Fluticasone-Salmeterol 250-50 MCG/DOSE Aepb Commonly known as:  ADVAIR Inhale 1 puff into the lungs 2 (two) times daily.   folic acid 086 MCG tablet Commonly known as:  FOLVITE Take 400 mcg by mouth daily.   furosemide 40 MG tablet Commonly known as:  LASIX Take 1 tablet (40 mg total) by mouth daily.   labetalol 200 MG tablet Commonly known as:  NORMODYNE Take 200 mg by mouth 2 (two) times daily.   multivitamin tablet Take 1 tablet by mouth daily.   nitroGLYCERIN 0.4 MG SL tablet Commonly known as:  NITROSTAT Place 0.4 mg under the tongue every 5 (five) minutes as needed for chest pain.   SPIRIVA HANDIHALER 18 MCG inhalation capsule Generic drug:  tiotropium Place 18 mcg into inhaler and inhale daily.   VITAMIN B 12 PO Take by mouth daily.       Allergies:  Allergies  Allergen Reactions  . Ipratropium Other (See Comments)  . Morphine And Related Nausea Only  . Vicodin [Hydrocodone-Acetaminophen] Other (See Comments)    Hallucinations   .  Penicillins Rash    Past Medical History:  Diagnosis Date  . AF (paroxysmal atrial fibrillation) ([place])   . AICD (automatic cardioverter/defibrillator) present   . CAD (coronary artery disease)   . Cardiomyopathy, ischemic   . Carotid artery occlusion   . Chronic kidney disease   . COPD (chronic obstructive pulmonary disease) ([place])   . Dementia without behavioral disturbance   . GERD (gastroesophageal reflux disease)   . Hyperlipidemia   . Hypertension   . Pneumonia   . Sleep apnea   . Stroke ([place])   . TIA (transient ischemic attack)     There is no history of radiation to the neck in childhood  Past Surgical History:  Procedure Laterality Date  . CARDIAC PACEMAKER PLACEMENT    . CATARACT EXTRACTION    . CORONARY  ARTERY BYPASS GRAFT    . CORONARY STENT PLACEMENT    . EYE SURGERY    . ICD IMPLANT N/A 12/29/2017   Procedure: ICD IMPLANT;  Surgeon: Evans Lance, MD;  Location: [facility];  Service: Cardiovascular;  Laterality: N/A;  . LEFT HEART CATH AND CORS/GRAFTS ANGIOGRAPHY N/A 12/29/2017   Procedure: LEFT HEART CATH AND CORS/GRAFTS ANGIOGRAPHY;  Surgeon: Martinique, Peter M, MD;  Location: [facility];  Service: Cardiovascular;  Laterality: N/A;    Family History  Problem Relation Age of Onset  . Hypertension Mother   . Heart disease Mother   . Hypertension Sister   . Heart disease Sister   . Thyroid disease Sister   . Thyroid disease Brother        hyperthyroid    Social History:  reports that he quit smoking about 5 years ago. His smoking use included cigarettes. He has never used smokeless tobacco. He reports that he does not drink alcohol or use drugs.     Review of Systems  Constitutional: Negative for weight loss.  HENT: Negative for trouble swallowing.   Respiratory: Negative for shortness of breath.   Cardiovascular: Negative for palpitations.  Gastrointestinal: Negative for abdominal pain.  Endocrine: Negative for fatigue.  Musculoskeletal: Negative for joint pain.  Neurological:       Has had mild memory loss    He is on treatment for hypertension, blood pressure higher than usual today  BP Readings from Last 3 Encounters:  06/25/18 (!) 170/72  05/23/18 138/76  04/03/18 128/68      Examination:   BP (!) 170/72 (BP Location: Left Arm, Patient Position: Sitting, Cuff Size: Normal)   Pulse 69   Ht 5\' 8"  (1.727 m)   Wt 169 lb 12.8 oz (77 kg)   SpO2 94%   BMI 25.82 kg/m    General Appearance:  well-looking        Eyes: No prominence or swelling of the eyes          THYROID: He has a right-sided goiter better felt on swallowing and somewhat lateral The thyroid enlargement is about 2-1/2 times normal and mostly superior where it is smooth and about 3  cm in size, smaller enlargement in the lower bone. About a 2 cm firm left-sided nodule felt on swallowing medially No isthmus enlargement   There is no lymphadenopathy in the neck appears to have No stridor No local tenderness  Heart sounds normal Lungs clear Abdomen shows no hepatosplenomegaly or other mass.    Reflexes at biceps are normal.  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

## 2018-06-25 ENCOUNTER — Encounter: Payer: Self-pay | Admitting: Endocrinology

## 2018-06-25 ENCOUNTER — Ambulatory Visit: Payer: PPO | Admitting: Endocrinology

## 2018-06-25 VITALS — BP 170/72 | HR 69 | Ht 68.0 in | Wt 169.8 lb

## 2018-06-25 DIAGNOSIS — E041 Nontoxic single thyroid nodule: Secondary | ICD-10-CM

## 2018-07-03 ENCOUNTER — Telehealth: Payer: Self-pay | Admitting: Cardiology

## 2018-07-03 ENCOUNTER — Encounter: Payer: PPO | Admitting: *Deleted

## 2018-07-03 NOTE — Telephone Encounter (Signed)
Confirmed remote transmission w/ pt wife. Pt wife informed me that patient has been seeing Dr. Wynonia Lawman.

## 2018-07-04 ENCOUNTER — Encounter: Payer: Self-pay | Admitting: Cardiology

## 2018-07-06 ENCOUNTER — Ambulatory Visit
Admission: RE | Admit: 2018-07-06 | Discharge: 2018-07-06 | Disposition: A | Discharge status: Home / Self Care | Payer: PPO | Source: Ambulatory Visit | Attending: Endocrinology | Admitting: Endocrinology

## 2018-07-06 DIAGNOSIS — E041 Nontoxic single thyroid nodule: Secondary | ICD-10-CM

## 2018-08-09 ENCOUNTER — Encounter: Payer: Self-pay | Admitting: Cardiology

## 2018-08-14 ENCOUNTER — Other Ambulatory Visit: Payer: Self-pay

## 2018-08-14 ENCOUNTER — Ambulatory Visit (INDEPENDENT_AMBULATORY_CARE_PROVIDER_SITE_OTHER): Payer: PPO | Admitting: Family Medicine

## 2018-08-14 VITALS — BP 112/64 | HR 66 | Temp 97.9°F | Ht 69.0 in | Wt 169.0 lb

## 2018-08-14 DIAGNOSIS — R5381 Other malaise: Secondary | ICD-10-CM

## 2018-08-14 DIAGNOSIS — R21 Rash and other nonspecific skin eruption: Secondary | ICD-10-CM | POA: Diagnosis not present

## 2018-08-14 NOTE — Patient Instructions (Signed)
It was nice seeing you today Mr. Bramel!  Please continue taking your current medications.  Talk with Dr. Wynonia Lawman on Thursday about your hydralazine.  Please continue to watch the rash on your face and apply hydrocortisone cream to see if that will help the itching.  If they become more numerous or become painful, please let us know.  Get the Shingrix shot when you are ready.  Please return in about 6 months for a follow up appointment.  If you have any questions or concerns, please feel free to call the clinic.   Be well,  Dr. Shan Levans

## 2018-08-14 NOTE — Progress Notes (Signed)
Subjective:    Joe Wiggins - 72 y.o. male MRN [id number]  Date of birth: [birth year]  HPI  Joe Wiggins is here for follow-up of his physical deconditioning.  He also wants to discuss a new rash.  Physical deconditioning status post hospitalization and rehab Continues to walk daily Does not walk fast, but he is able to walk longer distances Continues to feel fatigued frequently, but his wife says that he is overall improving Denies shortness of breath and has stopped taking Pulmicort Says his dizziness has improved Recently restarted hydralazine per Dr.Tilley; patient's wife reports that hydralazine often makes him feel "restless" and has caused him to feel dizzy in the past.  Facial rash Duration of a few months Covers the right side of his face in the T1 distribution, also in the right occipital region of his scalp Also was on his buttocks, but has since resolved Itchy but not painful No systemic symptoms Wife wonders if it is connected to his CPAP use, since he is often sweaty in that area when waking up after wearing the mask all night.  She also wonders if it could be connected to a medication that he is taking He received the first Shingrix shot last year, but due to the waiting list at his pharmacy, he did not receive the second dose No alleviating or exacerbating factors   Health Maintenance:  Health Maintenance Due  Topic Date Due  . INFLUENZA VACCINE  07/26/2018    -  reports that he quit smoking about 5 years ago. His smoking use included cigarettes. He has never used smokeless tobacco. - Review of Systems: Per HPI. - Past Medical History: Patient Active Problem List   Diagnosis Date Noted  . Facial rash 08/15/2018  . Orthostatic hypotension 03/25/2018  . Bradycardia 03/25/2018  . History of cardiac arrest 03/25/2018  . Vascular dementia without behavioral disturbance 03/25/2018  . Seasonal allergies 03/14/2018  . Pre-syncope 01/16/2018  . ICD (implantable  cardioverter-defibrillator) in place   . Physical deconditioning   . Acute respiratory failure ([place])   . Cardiac arrest ([place])   . Community acquired pneumonia of right upper lobe of lung ([place])   . HAP (hospital-acquired pneumonia) 12/25/2017  . CAD (coronary artery disease), native coronary artery 01/20/2016  . Long term current use of anticoagulant therapy 01/20/2016  . History of TIA (transient ischemic attack) and stroke 01/20/2016  . Sleep apnea   . Cardiac pacemaker in situ   . Anemia 01/17/2016  . Bilateral carotid artery stenosis   . Hyperlipidemia   . Dementia without behavioral disturbance   . Hypertensive heart disease without CHF   . Paroxysmal atrial fibrillation ([facility])   . Chronic kidney disease, stage 3 ([place])   . COPD exacerbation ([facility])    - Medications: reviewed and updated   Objective:   Physical Exam BP 112/64 (BP Location: Left Arm, Patient Position: Sitting, Cuff Size: Normal)   Pulse 66   Temp 97.9 F (36.6 C) (Oral)   Ht 5\' 9"  (1.753 m)   Wt 169 lb (76.7 kg)   SpO2 97%   BMI 24.96 kg/m  Gen: NAD, alert, cooperative with exam, well-appearing CV: RRR, good S1/S2, no murmur  Resp: CTABL, no wheezes, non-labored Abd: SNTND, BS present, no guarding or organomegaly Skin: Erythematous, discrete papules on right side of patient's forehead and right occipital region of his scalp.  Do not appear infected; no drainage or open lesions. Neuro: Appears to have mild neurocognitive decline Psych: Optimistic mood  and affect          Assessment & Plan:   Physical deconditioning Patient seems to be at his baseline.  Will not make any changes to his medications this visit.  Advised patient's wife to discuss her concerns about hydralazine with Dr. Wynonia Lawman at his upcoming visit.  I will not make any changes to this medication since he follows closely with Dr. Wynonia Lawman.  Facial rash Rash is not confined to a dermatomal distribution, is not painful, and is not vesicular,  so I am less concerned that this is shingles.  Advised patient to get his second Shingrix shot as soon as he can.  I advised his wife to try using hydrocortisone cream on the lesions to reduce itching.  Also encouraged her to call us if his rash worsens.    Maia Breslow, M.D. 08/15/2018, 11:15 AM PGY-2, [place]

## 2018-08-15 DIAGNOSIS — R21 Rash and other nonspecific skin eruption: Secondary | ICD-10-CM

## 2018-08-15 HISTORY — DX: Rash and other nonspecific skin eruption: R21

## 2018-08-15 NOTE — Assessment & Plan Note (Signed)
Patient seems to be at his baseline.  Will not make any changes to his medications this visit.  Advised patient's wife to discuss her concerns about hydralazine with Dr. Wynonia Lawman at his upcoming visit.  I will not make any changes to this medication since he follows closely with Dr. Wynonia Lawman.

## 2018-08-15 NOTE — Assessment & Plan Note (Addendum)
Rash is not confined to a dermatomal distribution, is not painful, and is not vesicular, so I am less concerned that this is shingles.  Advised patient to get his second Shingrix shot as soon as he can.  I advised his wife to try using hydrocortisone cream on the lesions to reduce itching.  Also encouraged her to call us if his rash worsens.

## 2018-08-16 DIAGNOSIS — I480 Paroxysmal atrial fibrillation: Secondary | ICD-10-CM | POA: Diagnosis not present

## 2018-08-16 DIAGNOSIS — I255 Ischemic cardiomyopathy: Secondary | ICD-10-CM | POA: Diagnosis not present

## 2018-08-16 DIAGNOSIS — I119 Hypertensive heart disease without heart failure: Secondary | ICD-10-CM | POA: Diagnosis not present

## 2018-08-16 DIAGNOSIS — I6523 Occlusion and stenosis of bilateral carotid arteries: Secondary | ICD-10-CM | POA: Diagnosis not present

## 2018-08-16 DIAGNOSIS — F0390 Unspecified dementia without behavioral disturbance: Secondary | ICD-10-CM | POA: Diagnosis not present

## 2018-08-16 DIAGNOSIS — I2510 Atherosclerotic heart disease of native coronary artery without angina pectoris: Secondary | ICD-10-CM | POA: Diagnosis not present

## 2018-08-16 DIAGNOSIS — Z7901 Long term (current) use of anticoagulants: Secondary | ICD-10-CM | POA: Diagnosis not present

## 2018-08-16 DIAGNOSIS — Z8673 Personal history of transient ischemic attack (TIA), and cerebral infarction without residual deficits: Secondary | ICD-10-CM | POA: Diagnosis not present

## 2018-08-16 DIAGNOSIS — Z95810 Presence of automatic (implantable) cardiac defibrillator: Secondary | ICD-10-CM | POA: Diagnosis not present

## 2018-08-16 DIAGNOSIS — N184 Chronic kidney disease, stage 4 (severe): Secondary | ICD-10-CM | POA: Diagnosis not present

## 2018-08-16 DIAGNOSIS — G4733 Obstructive sleep apnea (adult) (pediatric): Secondary | ICD-10-CM | POA: Diagnosis not present

## 2018-08-16 DIAGNOSIS — E785 Hyperlipidemia, unspecified: Secondary | ICD-10-CM | POA: Diagnosis not present

## 2018-08-16 DIAGNOSIS — J449 Chronic obstructive pulmonary disease, unspecified: Secondary | ICD-10-CM | POA: Diagnosis not present

## 2018-08-28 DIAGNOSIS — G4733 Obstructive sleep apnea (adult) (pediatric): Secondary | ICD-10-CM | POA: Diagnosis not present

## 2018-08-28 DIAGNOSIS — J440 Chronic obstructive pulmonary disease with acute lower respiratory infection: Secondary | ICD-10-CM | POA: Diagnosis not present

## 2018-08-28 DIAGNOSIS — I480 Paroxysmal atrial fibrillation: Secondary | ICD-10-CM | POA: Diagnosis not present

## 2018-10-03 ENCOUNTER — Ambulatory Visit: Payer: PPO | Admitting: Neurology

## 2018-10-04 ENCOUNTER — Encounter: Payer: Self-pay | Admitting: Neurology

## 2018-10-04 ENCOUNTER — Ambulatory Visit: Payer: PPO | Admitting: Neurology

## 2018-10-04 VITALS — BP 156/81 | HR 65 | Ht 69.0 in | Wt 172.0 lb

## 2018-10-04 DIAGNOSIS — G3184 Mild cognitive impairment, so stated: Secondary | ICD-10-CM | POA: Diagnosis not present

## 2018-10-04 NOTE — Progress Notes (Signed)
[facility]  PATIENT: Joe Wiggins DOB: 04-Feb-1946   REASON FOR VISIT: Follow-up for stroke/TIA  January 2017, dementia  HISTORY FROM: Patient and wife    HISTORY OF PRESENT ILLNESS: UPDATE 02/01/17 [name], 72 year old male returns for follow-up. He had hospital admission for TIA in January 2017. He has past medical history of paroxysmal atrial fibrillation on eliquis hypertension hyperlipidemia coronary artery disease status post CABG, pacemaker,  carotid artery disease, COPD  and dementia. He is currently on Aricept  23 mg daily  He is on Namenda 7 mg and has had trouble titrating this in the past. He denies further stroke or TIA symptoms since hospital discharge. He denies any headache double vision difficulty swallowing, slurred speech. He also has a history of obstructive sleep apnea and uses CPAP at night. He remains on eliquis for atrial fib and  secondary stroke prevention. He has bruising on his hands but no signs of bleeding. He denies any falls and he continues to walk at the mall.Patient is living at home with his wife.  He can dress himself and can shower though under observation. Is not driving or cooking. Appetite is good and he is sleeping well without hallucinations or wandering behavior. No safety issues identified. Last carotid Doppler done in this office 07/14/2016 shows complete occlusion of the right ICA and suspected left VA and abnormally elevated BNP was seen in the right and left ECA consistent with mild stenosis of the vessels. The left ICA stent is elevated BNP was still less than 50% diameter reduction mixed mild severe plaques with acoustic shadowing were noted bilaterally. He continues to follow up with Dr. Donnetta Hutching [facility].  Wife  reports he sometimes agitated and she was asked to discuss this with his primary care physician for management .He returns for reevaluation  Update 10/02/2017 : Patient returns for follow-up after last visit 6 months ago. He is  accompanied  by his wife. The patient discontinued Aricept since it caused diarrhea and had trouble tolerating it. He has not noticed any cognitive worsening in fact on Mini-Mental testing today scored 23/30 which is improvement from 19/30 at last visit. Patient continues on Namenda 14 mg daily and has not been able to tolerate a higher dose in the past. His short-term memory continues to be poor but he needs only my supervision and is mostly independent with most activities of daily living. He is not had any recurrent TIA or stroke symptoms. His remains on eliquis which is tolerating well with minor bruising but no bleeding episodes. He states his blood pressure is well controlled and today it is 130/60. He has no new complaints. He denies any agitation, delusions, hallucinations or unsafe behavior Update 10/04/2018 ; he returns for follow-up after last visit a year ago.  He is accompanied by his wife.  The patient states he discontinued Namenda about a year ago since it made him jittery and nervous.  He has not noticed any cognitive worsening and I feels that he is more or less the same.  He continues to have short-term memory difficulties and trouble remembering names and recent information.  He is still able to handle most of it effectively daily living.  He lives at home with his wife.  He has not had any.  Of agitation, delusions or hallucinations or unsafe behavior.  He ambulates well and has had no falls or injuries.  He has had no recurrent stroke or TIA symptoms.  He is tolerating Eliquis well without  bleeding but does get easy bruising.  His blood pressure usually is well controlled at home but today it is elevated in office at 156/80 .  He has not had any recent lipid profile checked but plans to have annual physical by his primary physician soon. REVIEW OF SYSTEMS: Full 14 system review of systems performed and notable only for those listed, all others are neg:  Hearing loss, runny nose, insomnia,  apnea, easy bruising and bleeding, anemia, agitation, confusion and all other systems negative  ALLERGIES: Allergies  Allergen Reactions  . Ipratropium Other (See Comments)  . Morphine And Related Nausea Only  . Vicodin [Hydrocodone-Acetaminophen] Other (See Comments)    Hallucinations   . Penicillins Rash    HOME MEDICATIONS: Outpatient Medications Prior to Visit  Medication Sig Dispense Refill  . albuterol (ACCUNEB) 0.63 MG/3ML nebulizer solution 3 ml inhale orally via nebulizer four times a day    . albuterol (PROVENTIL HFA;VENTOLIN HFA) 108 (90 Base) MCG/ACT inhaler Inhale 2 puffs into the lungs every 6 (six) hours as needed.     Marland Kitchen amiodarone (PACERONE) 200 MG tablet Take one tablet by mouth daily Monday through Saturday.  DO NOT take on Sunday. 90 tablet 3  . apixaban (ELIQUIS) 5 MG TABS tablet Take 5 mg by mouth 2 (two) times daily.    Marland Kitchen atorvastatin (LIPITOR) 40 MG tablet Take 40 mg by mouth daily.    . cholecalciferol (VITAMIN D) 1000 units tablet Take 1,000 Units by mouth daily.    . Cyanocobalamin (VITAMIN B 12 PO) Take by mouth daily.    . famotidine (PEPCID) 20 MG tablet Take 20 mg by mouth daily.    . ferrous sulfate 325 (65 FE) MG tablet Take 325 mg by mouth daily with breakfast.    . Fluticasone-Salmeterol (ADVAIR) 250-50 MCG/DOSE AEPB Inhale 1 puff into the lungs 2 (two) times daily.    . folic acid (FOLVITE) 007 MCG tablet Take 400 mcg by mouth daily.    . furosemide (LASIX) 40 MG tablet Take 1 tablet (40 mg total) by mouth daily. 30 tablet 0  . hydrALAZINE (APRESOLINE) 25 MG tablet Take 25 mg by mouth 3 (three) times daily.    Marland Kitchen labetalol (NORMODYNE) 200 MG tablet Take 200 mg by mouth 2 (two) times daily.    . Multiple Vitamin (MULTIVITAMIN) tablet Take 1 tablet by mouth daily.    . nitroGLYCERIN (NITROSTAT) 0.4 MG SL tablet Place 0.4 mg under the tongue every 5 (five) minutes as needed for chest pain.    Marland Kitchen tiotropium (SPIRIVA HANDIHALER) 18 MCG inhalation capsule  Place 18 mcg into inhaler and inhale daily.     No facility-administered medications prior to visit.     PAST MEDICAL HISTORY: Past Medical History:  Diagnosis Date  . AF (paroxysmal atrial fibrillation) ([place])   . AICD (automatic cardioverter/defibrillator) present   . CAD (coronary artery disease)   . Cardiomyopathy, ischemic   . Carotid artery occlusion   . Chronic kidney disease   . COPD (chronic obstructive pulmonary disease) ([place])   . Dementia without behavioral disturbance ([place])   . GERD (gastroesophageal reflux disease)   . Hyperlipidemia   . Hypertension   . Pneumonia   . Sleep apnea   . Stroke ([place])   . TIA (transient ischemic attack)     PAST SURGICAL HISTORY: Past Surgical History:  Procedure Laterality Date  . CARDIAC PACEMAKER PLACEMENT    . CATARACT EXTRACTION    . CORONARY ARTERY BYPASS GRAFT    .  CORONARY STENT PLACEMENT    . EYE SURGERY    . ICD IMPLANT N/A 12/29/2017   Procedure: ICD IMPLANT;  Surgeon: Evans Lance, MD;  Location: [facility];  Service: Cardiovascular;  Laterality: N/A;  . LEFT HEART CATH AND CORS/GRAFTS ANGIOGRAPHY N/A 12/29/2017   Procedure: LEFT HEART CATH AND CORS/GRAFTS ANGIOGRAPHY;  Surgeon: Martinique, Peter M, MD;  Location: [facility];  Service: Cardiovascular;  Laterality: N/A;    FAMILY HISTORY: Family History  Problem Relation Age of Onset  . Hypertension Mother   . Heart disease Mother   . Hypertension Sister   . Heart disease Sister   . Thyroid disease Sister   . Thyroid disease Brother        hyperthyroid    SOCIAL HISTORY: Social History   Socioeconomic History  . Marital status: Married    Spouse name: Not on file  . Number of children: Not on file  . Years of education: Not on file  . Highest education level: Not on file  Occupational History  . Not on file  Social Needs  . Financial resource strain: Not on file  . Food insecurity:    Worry: Not on file    Inability: Not on file  .  Transportation needs:    Medical: Not on file    Non-medical: Not on file  Tobacco Use  . Smoking status: Former Smoker    Types: Cigarettes    Last attempt to quit: 11/30/2012    Years since quitting: 5.8  . Smokeless tobacco: Never Used  Substance and Sexual Activity  . Alcohol use: No  . Drug use: No  . Sexual activity: Not on file  Lifestyle  . Physical activity:    Days per week: Not on file    Minutes per session: Not on file  . Stress: Not on file  Relationships  . Social connections:    Talks on phone: Not on file    Gets together: Not on file    Attends religious service: Not on file    Active member of club or organization: Not on file    Attends meetings of clubs or organizations: Not on file    Relationship status: Not on file  . Intimate partner violence:    Fear of current or ex partner: Not on file    Emotionally abused: Not on file    Physically abused: Not on file    Forced sexual activity: Not on file  Other Topics Concern  . Not on file  Social History Narrative  . Not on file     PHYSICAL EXAM  Vitals:   10/04/18 1428  BP: (!) 156/81  Pulse: 65  Weight: 172 lb (78 kg)  Height: 5\' 9"  (1.753 m)   Body mass index is 25.4 kg/m.   Generalized: Well developed, in no acute distress , well-groomed Head: normocephalic and atraumatic,.  Neck: Supple, no carotid bruits  Cardiac: Regular rate rhythm, no murmur  Musculoskeletal: No deformity  Skin bruising of the hands Neurological examination   Mentation: Alert MMSE not done AFT 8. Clock drawing 4/4.  MMSE - Mini Mental State Exam 10/02/2017 02/01/2017 06/29/2016  Orientation to time 5 4 1   Orientation to Place 5 3 3   Registration 3 3 3   Attention/ Calculation 2 1 3   Recall 0 0 0  Language- name 2 objects 2 2 2   Language- repeat 1 1 1   Language- follow 3 step command 3 3 3  Language- read & follow direction 1 1 1   Write a sentence 1 1 1   Copy design 0 0 1  Total score 23 19 19   .  Follows all  commands speech and language fluent.   Cranial nerve II-XII: .Pupils were equal round reactive to light extraocular movements were full, visual field were full on confrontational test. Facial sensation and strength were normal. hearing was intact to finger rubbing bilaterally. Uvula tongue midline. head turning and shoulder shrug were normal and symmetric.Tongue protrusion into cheek strength was normal. Motor: normal bulk and tone, full strength in the BUE, BLE, fine finger movements normal, no pronator drift. No focal weakness Sensory: normal and symmetric to light touch, pinprick, and  Vibration, In the upper and lower extremities Coordination: finger-nose-finger, heel-to-shin bilaterally, no dysmetria Reflexes: Symmetric upper and lower plantar responses were flexor bilaterally. Gait and Station: Rising up from seated position without assistance, normal stance,  moderate stride, good arm swing, smooth turning, able to perform tiptoe, and heel walking without difficulty. Tandem gait is mildly unsteady. No assistive device  DIAGNOSTIC DATA (LABS, IMAGING, TESTING) - I reviewed patient records, labs, notes, testing and imaging myself where available.   Reviewed recent CMP and lipid profile from 08/18/2016 from care everywhere    ASSESSMENT AND PLAN 64 year Caucasian male with right brain TIA and subacute right frontal subcortical infarct in Jan 2017 secondary to atrial fibrillation with  moderate baseline dementia. Multiple vascular risk factors of atrial fibrillation, hypertension, hyperlipidemia, coronary artery disease, ischemic cardiomyopathy, bilateral extracranial carotid disease and COPD and OSA... Mild dementia which appears to be stable.  PLAN: I had a long d/w patient about his remote stroke,dementia, risk for recurrent stroke/TIAs, personally independently reviewed imaging studies and stroke evaluation results and answered questions.Continue Eliquis (apixaban) daily  for secondary  stroke prevention for his atrial fibrillation history  and maintain strict control of hypertension with blood pressure goal below 130/90, diabetes with hemoglobin A1c goal below 6.5% and lipids with LDL cholesterol goal below 70 mg/dL.  She apparently has not been able to tolerate Aricept or Namenda.  Continue participation cognitively challenging activities like solving crossword puzzles, playing bridge and sodoku.  We also discussed memory compensation strategies. Marland Kitchen He will followup in the future with me in one year or call earlier if necessary. spent 30min  in total face to face time with the patient more than 50% of which was spent counseling and coordination of care about his dementia and remote stroke, reviewing test results reviewing medications and discussing and reviewing the diagnosis of stroke prevention, atherosclerosis and importance of control of blood pressure blood glucose and lipid levels and continued cessation of smoking    Antony Contras, MD [facility] [address] [place], [place] [address] [phone]

## 2018-10-04 NOTE — Patient Instructions (Addendum)
I had a long d/w patient about his remote stroke,dementia, risk for recurrent stroke/TIAs, personally independently reviewed imaging studies and stroke evaluation results and answered questions.Continue Eliquis (apixaban) daily  for secondary stroke prevention for his atrial fibrillation history  and maintain strict control of hypertension with blood pressure goal below 130/90, diabetes with hemoglobin A1c goal below 6.5% and lipids with LDL cholesterol goal below 70 mg/dL.  She apparently has not been able to tolerate Aricept or Namenda.  Continue participation cognitively challenging activities like solving crossword puzzles, playing bridge and sodoku.  We also discussed memory compensation strategies. Marland Kitchen He will followup in the future with me in one year or call earlier if necessary. Memory Compensation Strategies  1. Use "WARM" strategy.  W= write it down  A= associate it  R= repeat it  M= make a mental note  2.   You can keep a Social worker.  Use a 3-ring notebook with sections for the following: calendar, important names and phone numbers,  medications, doctors' names/phone numbers, lists/reminders, and a section to journal what you did  each day.   3.    Use a calendar to write appointments down.  4.    Write yourself a schedule for the day.  This can be placed on the calendar or in a separate section of the Memory Notebook.  Keeping a  regular schedule can help memory.  5.    Use medication organizer with sections for each day or morning/evening pills.  You may need help loading it  6.    Keep a basket, or pegboard by the door.  Place items that you need to take out with you in the basket or on the pegboard.  You may also want to  include a message board for reminders.  7.    Use sticky notes.  Place sticky notes with reminders in a place where the task is performed.  For example: " turn off the  stove" placed by the stove, "lock the door" placed on the door at eye level, " take  your medications" on  the bathroom mirror or by the place where you normally take your medications.  8.    Use alarms/timers.  Use while cooking to remind yourself to check on food or as a reminder to take your medicine, or as a  reminder to make a call, or as a reminder to perform another task, etc.

## 2018-10-15 DIAGNOSIS — N184 Chronic kidney disease, stage 4 (severe): Secondary | ICD-10-CM | POA: Diagnosis not present

## 2018-10-23 DIAGNOSIS — I129 Hypertensive chronic kidney disease with stage 1 through stage 4 chronic kidney disease, or unspecified chronic kidney disease: Secondary | ICD-10-CM | POA: Diagnosis not present

## 2018-10-23 DIAGNOSIS — N184 Chronic kidney disease, stage 4 (severe): Secondary | ICD-10-CM | POA: Diagnosis not present

## 2018-10-23 DIAGNOSIS — D649 Anemia, unspecified: Secondary | ICD-10-CM | POA: Diagnosis not present

## 2018-11-19 ENCOUNTER — Ambulatory Visit (INDEPENDENT_AMBULATORY_CARE_PROVIDER_SITE_OTHER): Payer: PPO | Admitting: *Deleted

## 2018-11-19 DIAGNOSIS — R001 Bradycardia, unspecified: Secondary | ICD-10-CM

## 2018-11-19 LAB — CUP PACEART INCLINIC DEVICE CHECK
Battery Remaining Longevity: 66 mo
Brady Statistic RA Percent Paced: 99.59 %
Brady Statistic RV Percent Paced: 0.57 %
Date Time Interrogation Session: 20191125143457
HighPow Impedance: 74.25 Ohm
Implantable Lead Implant Date: 20140217
Implantable Lead Implant Date: 20190104
Implantable Lead Location: 753859
Implantable Lead Location: 753860
Implantable Lead Model: 7122
Implantable Pulse Generator Implant Date: 20190104
Lead Channel Impedance Value: 362.5 Ohm
Lead Channel Impedance Value: 675 Ohm
Lead Channel Pacing Threshold Amplitude: 1 V
Lead Channel Pacing Threshold Amplitude: 1.25 V
Lead Channel Pacing Threshold Pulse Width: 0.5 ms
Lead Channel Pacing Threshold Pulse Width: 0.8 ms
Lead Channel Sensing Intrinsic Amplitude: 0.7 mV
Lead Channel Sensing Intrinsic Amplitude: 12 mV
Lead Channel Setting Pacing Amplitude: 2.5 V
Lead Channel Setting Pacing Amplitude: 2.5 V
Lead Channel Setting Pacing Pulse Width: 0.5 ms
Lead Channel Setting Sensing Sensitivity: 0.5 mV
Pulse Gen Serial Number: 9777027

## 2018-11-19 NOTE — Progress Notes (Signed)
ICD check in clinic. Normal device function. Thresholds and sensing consistent with previous device measurements. Impedance trends stable over time. No evidence of any ventricular arrhythmias. <1% AT/AF burden-- EGMs appear FFRW. Histogram distribution appropriate for patient and level of activity. No changes made this session. Device programmed at appropriate safety margins. Device programmed to optimize intrinsic conduction. Estimated longevity 4.8-5.5 years. Patient education completed including shock plan. ROV with GT 01/16/18.

## 2018-12-07 ENCOUNTER — Telehealth: Payer: Self-pay | Admitting: Internal Medicine

## 2018-12-07 NOTE — Telephone Encounter (Signed)
[name] [name] called wondering if it would be possible if we could give the patient a scale. She states a home nurse came yesterday to the house and advise them that sometimes the cardiologist can supply that. Please advise.

## 2018-12-10 ENCOUNTER — Other Ambulatory Visit: Payer: Self-pay

## 2018-12-10 DIAGNOSIS — I6521 Occlusion and stenosis of right carotid artery: Secondary | ICD-10-CM

## 2018-12-10 DIAGNOSIS — I6522 Occlusion and stenosis of left carotid artery: Secondary | ICD-10-CM

## 2018-12-10 NOTE — Telephone Encounter (Signed)
Call returned to Pt's wife.  Advised we did not provide scales for our patient's.

## 2018-12-20 ENCOUNTER — Encounter: Payer: Self-pay | Admitting: Internal Medicine

## 2018-12-31 DIAGNOSIS — G4733 Obstructive sleep apnea (adult) (pediatric): Secondary | ICD-10-CM | POA: Diagnosis not present

## 2019-01-01 ENCOUNTER — Ambulatory Visit (HOSPITAL_COMMUNITY)
Admission: RE | Admit: 2019-01-01 | Discharge: 2019-01-01 | Disposition: A | Discharge status: Home / Self Care | Payer: PPO | Source: Ambulatory Visit | Attending: Vascular Surgery | Admitting: Vascular Surgery

## 2019-01-01 ENCOUNTER — Ambulatory Visit (INDEPENDENT_AMBULATORY_CARE_PROVIDER_SITE_OTHER): Payer: Self-pay | Admitting: Family

## 2019-01-01 ENCOUNTER — Other Ambulatory Visit: Payer: Self-pay

## 2019-01-01 ENCOUNTER — Encounter: Payer: Self-pay | Admitting: Family

## 2019-01-01 VITALS — BP 163/70 | HR 65 | Temp 97.0°F | Resp 18 | Ht 69.0 in | Wt 164.0 lb

## 2019-01-01 DIAGNOSIS — I6522 Occlusion and stenosis of left carotid artery: Secondary | ICD-10-CM

## 2019-01-01 DIAGNOSIS — Z959 Presence of cardiac and vascular implant and graft, unspecified: Secondary | ICD-10-CM

## 2019-01-01 DIAGNOSIS — I6521 Occlusion and stenosis of right carotid artery: Secondary | ICD-10-CM | POA: Diagnosis not present

## 2019-01-01 DIAGNOSIS — E010 Iodine-deficiency related diffuse (endemic) goiter: Secondary | ICD-10-CM

## 2019-01-01 NOTE — Progress Notes (Signed)
Chief Complaint: Follow up Extracranial Carotid Artery Stenosis   History of Present Illness  Joe Wiggins is a 73 y.o. male who wasinitiallyreferred by Dr. Wynonia Lawman to establish care. Dr. Luther Parody initial evaluation was on 12-01-15. The patient is s/p left carotid stenting by Dr. Maryjean Morn in 2013. He sufferedastroke during the procedure on postoperative day 1.  He has a known right internal carotid occlusion. It is unclear whether this was associated with a stroke or not.  He has mild neurological deficits including some memory issues, tingling in left fingers and foot,and a slight leftfoot drop. He denies any amaurosis fugax, receptive or expressive aphasia, facial droop or sudden numbness/weakness of his arms or legs.   By his account and his wife's', it appears that he has not has any subsequent neurological events since 2013.  He has a past medical history of paroxysmal atrial fibrillation managed on eliquis. He has CAD s/p CABG. He has hyperlipidemia managed on a statin and hypertension.  On ROS, he denies any issues with ambulation. He walks the 3 floors in the mall, rests for 5-10 minutes after each floor level. He denies chest pain or dyspnea.   I referred pt to Dr. Doristine Johns at his visit in December 2018, to evaluate thyroid mass noted on carotid duplex; pt saw Dr. Caralyn Guile in 2019 per wife, no intervention was needed per wife, no follow up needed.    Diabetic:no Tobacco WCB:JSEGBT smoker, quitin 2013, started at age 13  Pt meds include: Statin :yes ASA:no Other anticoagulants/antiplatelets:Eliquis, history of atrial fib   Past Medical History:  Diagnosis Date  . AF (paroxysmal atrial fibrillation) ([place])   . AICD (automatic cardioverter/defibrillator) present   . CAD (coronary artery disease)   . Cardiomyopathy, ischemic   . Carotid artery occlusion   . Chronic kidney disease   . COPD (chronic obstructive pulmonary disease) ([place])   . Dementia  without behavioral disturbance ([place])   . GERD (gastroesophageal reflux disease)   . Hyperlipidemia   . Hypertension   . Pneumonia   . Sleep apnea   . Stroke ([place])   . TIA (transient ischemic attack)     Social History Social History   Tobacco Use  . Smoking status: Former Smoker    Types: Cigarettes    Last attempt to quit: 11/30/2012    Years since quitting: 6.0  . Smokeless tobacco: Never Used  Substance Use Topics  . Alcohol use: No  . Drug use: No    Family History Family History  Problem Relation Age of Onset  . Hypertension Mother   . Heart disease Mother   . Hypertension Sister   . Heart disease Sister   . Thyroid disease Sister   . Thyroid disease Brother        hyperthyroid    Surgical History Past Surgical History:  Procedure Laterality Date  . CARDIAC PACEMAKER PLACEMENT    . CATARACT EXTRACTION    . CORONARY ARTERY BYPASS GRAFT    . CORONARY STENT PLACEMENT    . EYE SURGERY    . ICD IMPLANT N/A 12/29/2017   Procedure: ICD IMPLANT;  Surgeon: Evans Lance, MD;  Location: [facility];  Service: Cardiovascular;  Laterality: N/A;  . LEFT HEART CATH AND CORS/GRAFTS ANGIOGRAPHY N/A 12/29/2017   Procedure: LEFT HEART CATH AND CORS/GRAFTS ANGIOGRAPHY;  Surgeon: Martinique, Peter M, MD;  Location: [facility];  Service: Cardiovascular;  Laterality: N/A;    Allergies  Allergen Reactions  . Ipratropium  Other (See Comments)  . Morphine And Related Nausea Only  . Vicodin [Hydrocodone-Acetaminophen] Other (See Comments)    Hallucinations   . Penicillins Rash    Current Outpatient Medications  Medication Sig Dispense Refill  . albuterol (ACCUNEB) 0.63 MG/3ML nebulizer solution 3 ml inhale orally via nebulizer four times a day    . albuterol (PROVENTIL HFA;VENTOLIN HFA) 108 (90 Base) MCG/ACT inhaler Inhale 2 puffs into the lungs every 6 (six) hours as needed.     Marland Kitchen amiodarone (PACERONE) 200 MG tablet Take one tablet by mouth daily Monday through  Saturday.  DO NOT take on Sunday. 90 tablet 3  . apixaban (ELIQUIS) 5 MG TABS tablet Take 5 mg by mouth 2 (two) times daily.    Marland Kitchen atorvastatin (LIPITOR) 40 MG tablet Take 40 mg by mouth daily.    . cholecalciferol (VITAMIN D) 1000 units tablet Take 1,000 Units by mouth daily.    . Cyanocobalamin (VITAMIN B 12 PO) Take by mouth daily.    . famotidine (PEPCID) 20 MG tablet Take 20 mg by mouth daily.    . ferrous sulfate 325 (65 FE) MG tablet Take 325 mg by mouth daily with breakfast.    . Fluticasone-Salmeterol (ADVAIR) 250-50 MCG/DOSE AEPB Inhale 1 puff into the lungs 2 (two) times daily.    . folic acid (FOLVITE) 863 MCG tablet Take 400 mcg by mouth daily.    . furosemide (LASIX) 40 MG tablet Take 1 tablet (40 mg total) by mouth daily. 30 tablet 0  . hydrALAZINE (APRESOLINE) 25 MG tablet Take 25 mg by mouth 3 (three) times daily.    Marland Kitchen labetalol (NORMODYNE) 200 MG tablet Take 200 mg by mouth 2 (two) times daily.    . Multiple Vitamin (MULTIVITAMIN) tablet Take 1 tablet by mouth daily.    . nitroGLYCERIN (NITROSTAT) 0.4 MG SL tablet Place 0.4 mg under the tongue every 5 (five) minutes as needed for chest pain.    Marland Kitchen tiotropium (SPIRIVA HANDIHALER) 18 MCG inhalation capsule Place 18 mcg into inhaler and inhale daily.     No current facility-administered medications for this visit.     Review of Systems : See HPI for pertinent positives and negatives.  Physical Examination  Vitals:   01/01/19 1456 01/01/19 1500  BP: (!) 167/73 (!) 163/70  Pulse: 65 65  Resp: 18   Temp: (!) 97 F (36.1 C)   TempSrc: Oral   SpO2: 99%   Weight: 164 lb (74.4 kg)   Height: 5\' 9"  (1.753 m)    Body mass index is 24.22 kg/m.  General: WDWN male in NAD GAIT: normal Eyes: PERRLA HENT: Mild thyromegaly.  Pulmonary:  Respirations are non-labored, fair air movement in all fields, no rales, rhonchi, or wheezes.  Cardiac: regular rhythm, no detected murmur. Pacemaker/AICD palpable left upper chest.    VASCULAR EXAM Carotid Bruits Right Left   positive positive     Abdominal aortic pulse is not palpable. Radial pulses are 2+ palpable and equal.  LE Pulses Right Left       POPLITEAL  not palpable   not palpable       POSTERIOR TIBIAL  2+ palpable   2+ palpable        DORSALIS PEDIS      ANTERIOR TIBIAL faintly palpable  faintly palpable     Gastrointestinal: soft, nontender, BS WNL, no r/g, no palpable masses. Musculoskeletal: no muscle atrophy/wasting. M/S 5/5 throughout, extremities without ischemic changes. Skin: No rashes, no ulcers, no cellulitis.   Neurologic:  A&O X 2; appropriate affect, sensation is normal; speech is normal, CN 2-12 intact except tongue drift to left, pain and light touch intact in extremities, motor exam as listed above. Psychiatric: Normal thought content, mood appropriate to clinical situation.    Assessment: Joe Wiggins is a 73 y.o. male who iss/p left carotid stenting in 2013. He suffered astroke during the procedure and on postoperative day 1. He has a known right internal carotid occlusion.  He has not had any subsequent neurological events since 2013.   His atherosclerotic risk factors include former smoker x 54 years, CAD, atrial fib, CKD, COPD, hypertension and dyslipidemia.  He takes Eliquis and a statin.  He walks 2 hours daily.   I discussed with Dr. Donnetta Hutching that the right vertebral artery demonstrates high resistance flow and the left vertebral artery was not visualized. This is different from the exam in December 2018 in which bilateral vertebral artery flow was antegrade.  The vertebral arteries are sometimes difficult to visualize, and pt is not having vertebral basilar symptoms.     DATA Carotid Duplex  (01-01-19): Right ICA occlusion confirmed.  40-59% stenosis of the left ICA s/p stenting. Velocities may  be falsely elevated secondary to contralateral occlusion.  >50% stenosis of the bilateral ECA. Vertebrals:  Right vertebral artery demonstrates high resistant flow. Left              vertebral artery was not visualized. Subclavians: Right subclavian artery flow was disturbed. Normal flow              hemodynamics were seen in bilateral subclavian arteries.  No change in the ICA stenosis compared to the exam on 12-12-17.   Plan: Follow-up in 6 months with Carotid Duplex scan.    I discussed in depth with the patient the nature of atherosclerosis, and emphasized the importance of maximal medical management including strict control of blood pressure, blood glucose, and lipid levels, obtaining regular exercise, and continued cessation of smoking.  The patient is aware that without maximal medical management the underlying atherosclerotic disease process will progress, limiting the benefit of any interventions. The patient was given information about stroke prevention and what symptoms should prompt the patient to seek immediate medical care. Thank you for allowing [place] to participate in this patient's care.  Clemon Chambers, RN, MSN, FNP-C [facility] Office: [phone]  Clinic Physician: Bishop Dublin  01/01/19 3:10 PM

## 2019-01-01 NOTE — Patient Instructions (Signed)

## 2019-01-16 ENCOUNTER — Ambulatory Visit (INDEPENDENT_AMBULATORY_CARE_PROVIDER_SITE_OTHER): Payer: HMO | Admitting: Internal Medicine

## 2019-01-16 ENCOUNTER — Encounter: Payer: Self-pay | Admitting: Internal Medicine

## 2019-01-16 VITALS — BP 128/70 | HR 65 | Ht 69.0 in | Wt 176.0 lb

## 2019-01-16 DIAGNOSIS — I480 Paroxysmal atrial fibrillation: Secondary | ICD-10-CM

## 2019-01-16 DIAGNOSIS — I5022 Chronic systolic (congestive) heart failure: Secondary | ICD-10-CM | POA: Diagnosis not present

## 2019-01-16 DIAGNOSIS — I495 Sick sinus syndrome: Secondary | ICD-10-CM | POA: Diagnosis not present

## 2019-01-16 DIAGNOSIS — Z95810 Presence of automatic (implantable) cardiac defibrillator: Secondary | ICD-10-CM

## 2019-01-16 MED ORDER — HYDRALAZINE HCL 25 MG PO TABS: 25.0000 mg | ORAL_TABLET | Freq: Two times a day (BID) | ORAL | 3 refills | Status: DC

## 2019-01-16 NOTE — Progress Notes (Signed)
HPI Mr. Champney returns today for followup. 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 He feels well except for some dizziness. No chest pain or sob. No edema. He has easy bruisability in his arms. He is on systemic anti-coagulation and amio for atrial fib. He has sinus node dysfunction.When I saw him last, he was instructed to reduce his dose of amio to 200 mg six times a week but it was continued daily.  Allergies  Allergen Reactions  . Ipratropium Other (See Comments)  . Morphine And Related Nausea Only  . Vicodin [Hydrocodone-Acetaminophen] Other (See Comments)    Hallucinations   . Penicillins Rash     Current Outpatient Medications  Medication Sig Dispense Refill  . albuterol (ACCUNEB) 0.63 MG/3ML nebulizer solution 3 ml inhale orally via nebulizer four times a day    . albuterol (PROVENTIL HFA;VENTOLIN HFA) 108 (90 Base) MCG/ACT inhaler Inhale 2 puffs into the lungs every 6 (six) hours as needed.     Marland Kitchen amiodarone (PACERONE) 200 MG tablet Take one tablet by mouth daily Monday through Saturday.  DO NOT take on Sunday. 90 tablet 3  . apixaban (ELIQUIS) 5 MG TABS tablet Take 5 mg by mouth 2 (two) times daily.    Marland Kitchen atorvastatin (LIPITOR) 40 MG tablet Take 40 mg by mouth daily.    . cholecalciferol (VITAMIN D) 1000 units tablet Take 1,000 Units by mouth daily.    . Cyanocobalamin (VITAMIN B 12 PO) Take by mouth daily.    . famotidine (PEPCID) 20 MG tablet Take 20 mg by mouth daily.    . ferrous sulfate 325 (65 FE) MG tablet Take 325 mg by mouth daily with breakfast.    . Fluticasone-Salmeterol (ADVAIR) 250-50 MCG/DOSE AEPB Inhale 1 puff into the lungs 2 (two) times daily.    . folic acid (FOLVITE) 401 MCG tablet Take 400 mcg by mouth daily.    . furosemide (LASIX) 40 MG tablet Take 40 mg by mouth 2 (two) times daily.    . hydrALAZINE (APRESOLINE) 25 MG tablet Take 1 tablet (25 mg total) by mouth 2 (two) times daily. 180 tablet 3  . labetalol (NORMODYNE) 200 MG  tablet Take 300 mg by mouth 2 (two) times daily.     . Multiple Vitamin (MULTIVITAMIN) tablet Take 1 tablet by mouth daily.    . nitroGLYCERIN (NITROSTAT) 0.4 MG SL tablet Place 0.4 mg under the tongue every 5 (five) minutes as needed for chest pain.    Marland Kitchen tiotropium (SPIRIVA HANDIHALER) 18 MCG inhalation capsule Place 18 mcg into inhaler and inhale daily.     No current facility-administered medications for this visit.      Past Medical History:  Diagnosis Date  . AF (paroxysmal atrial fibrillation) ([place])   . AICD (automatic cardioverter/defibrillator) present   . CAD (coronary artery disease)   . Cardiomyopathy, ischemic   . Carotid artery occlusion   . Chronic kidney disease   . COPD (chronic obstructive pulmonary disease) ([place])   . Dementia without behavioral disturbance ([place])   . GERD (gastroesophageal reflux disease)   . Hyperlipidemia   . Hypertension   . Pneumonia   . Sleep apnea   . Stroke ([place])   . TIA (transient ischemic attack)     ROS:   All systems reviewed and negative except as noted in the HPI.   Past Surgical History:  Procedure Laterality Date  . CARDIAC PACEMAKER PLACEMENT    .  CATARACT EXTRACTION    . CORONARY ARTERY BYPASS GRAFT    . CORONARY STENT PLACEMENT    . EYE SURGERY    . ICD IMPLANT N/A 12/29/2017   Procedure: ICD IMPLANT;  Surgeon: Evans Lance, MD;  Location: [facility];  Service: Cardiovascular;  Laterality: N/A;  . LEFT HEART CATH AND CORS/GRAFTS ANGIOGRAPHY N/A 12/29/2017   Procedure: LEFT HEART CATH AND CORS/GRAFTS ANGIOGRAPHY;  Surgeon: Martinique, Peter M, MD;  Location: [facility];  Service: Cardiovascular;  Laterality: N/A;     Family History  Problem Relation Age of Onset  . Hypertension Mother   . Heart disease Mother   . Hypertension Sister   . Heart disease Sister   . Thyroid disease Sister   . Thyroid disease Brother        hyperthyroid     Social History   Socioeconomic History  . Marital status:  Married    Spouse name: Not on file  . Number of children: Not on file  . Years of education: Not on file  . Highest education level: Not on file  Occupational History  . Not on file  Social Needs  . Financial resource strain: Not on file  . Food insecurity:    Worry: Not on file    Inability: Not on file  . Transportation needs:    Medical: Not on file    Non-medical: Not on file  Tobacco Use  . Smoking status: Former Smoker    Types: Cigarettes    Last attempt to quit: 11/30/2012    Years since quitting: 6.1  . Smokeless tobacco: Never Used  Substance and Sexual Activity  . Alcohol use: No  . Drug use: No  . Sexual activity: Not on file  Lifestyle  . Physical activity:    Days per week: Not on file    Minutes per session: Not on file  . Stress: Not on file  Relationships  . Social connections:    Talks on phone: Not on file    Gets together: Not on file    Attends religious service: Not on file    Active member of club or organization: Not on file    Attends meetings of clubs or organizations: Not on file    Relationship status: Not on file  . Intimate partner violence:    Fear of current or ex partner: Not on file    Emotionally abused: Not on file    Physically abused: Not on file    Forced sexual activity: Not on file  Other Topics Concern  . Not on file  Social History Narrative  . Not on file     BP 128/70   Pulse 65   Ht 5\' 9"  (1.753 m)   Wt 176 lb (79.8 kg)   SpO2 98%   BMI 25.99 kg/m   Physical Exam:  Well appearing NAD HEENT: Unremarkable Neck:  No JVD, no thyromegally Lymphatics:  No adenopathy Back:  No CVA tenderness Lungs:  Clear HEART:  Regular rate rhythm, no murmurs, no rubs, no clicks Abd:  soft, positive bowel sounds, no organomegally, no rebound, no guarding Ext:  2 plus pulses, no edema, no cyanosis, no clubbing Skin:  No rashes no nodules Neuro:  CN II through XII intact, motor grossly intact  EKG - nsr with atrial  pacing  DEVICE  Normal device function.  See PaceArt for details.   Assess/Plan: 1. Sinus node dysfunction - he is s/p PPM  insertion and asymptomatic. 2. ICD - his [employer] DDD ICD is working normally.  3. Chronic systolic heart failure - his symptoms are class 2. He has not had any ICD therapies. 4. PAF - he will continue his current meds. He is maintaining NSR over 98% of the time.  Mikle Bosworth.D.

## 2019-01-16 NOTE — Patient Instructions (Signed)
Medication Instructions:  Your physician recommends that you continue on your current medications as directed. Please refer to the Current Medication list given to you today.  Labwork: None ordered.  Testing/Procedures: None ordered.  Follow-Up:  Your physician wants you to follow-up in: 3 months with device clinic for a device check.    Your physician wants you to follow-up in: one year with Dr. Lovena Le.   You will receive a reminder letter in the mail two months in advance. If you don't receive a letter, please call our office to schedule the follow-up appointment.  Any Other Special Instructions Will Be Listed Below (If Applicable).  If you need a refill on your cardiac medications before your next appointment, please call your pharmacy.

## 2019-01-17 LAB — CUP PACEART INCLINIC DEVICE CHECK
Battery Remaining Longevity: 63 mo
Brady Statistic RA Percent Paced: 98 %
Brady Statistic RV Percent Paced: 2.4 %
Date Time Interrogation Session: 20200122201327
HighPow Impedance: 74.25 Ohm
Implantable Lead Implant Date: 20140217
Implantable Lead Implant Date: 20190104
Implantable Lead Location: 753859
Implantable Lead Location: 753860
Implantable Lead Model: 7122
Implantable Pulse Generator Implant Date: 20190104
Lead Channel Impedance Value: 362.5 Ohm
Lead Channel Impedance Value: 687.5 Ohm
Lead Channel Pacing Threshold Amplitude: 1 V
Lead Channel Pacing Threshold Amplitude: 1 V
Lead Channel Pacing Threshold Amplitude: 1.25 V
Lead Channel Pacing Threshold Amplitude: 1.25 V
Lead Channel Pacing Threshold Pulse Width: 0.5 ms
Lead Channel Pacing Threshold Pulse Width: 0.5 ms
Lead Channel Pacing Threshold Pulse Width: 0.8 ms
Lead Channel Pacing Threshold Pulse Width: 0.8 ms
Lead Channel Sensing Intrinsic Amplitude: 0.9 mV
Lead Channel Sensing Intrinsic Amplitude: 12 mV
Lead Channel Setting Pacing Amplitude: 2.5 V
Lead Channel Setting Pacing Amplitude: 2.5 V
Lead Channel Setting Pacing Pulse Width: 0.5 ms
Lead Channel Setting Sensing Sensitivity: 0.5 mV
Pulse Gen Serial Number: 9777027

## 2019-01-17 IMAGING — DX DG CHEST 2V
2 series · 2 of 2 positions shown · non-contrast
Comparison: 01/17/2016.

CLINICAL DATA: Shortness of breath for the past 2 days. Productive
cough. Ex-smoker.

EXAM:
CHEST  2 VIEW

[chest ap]
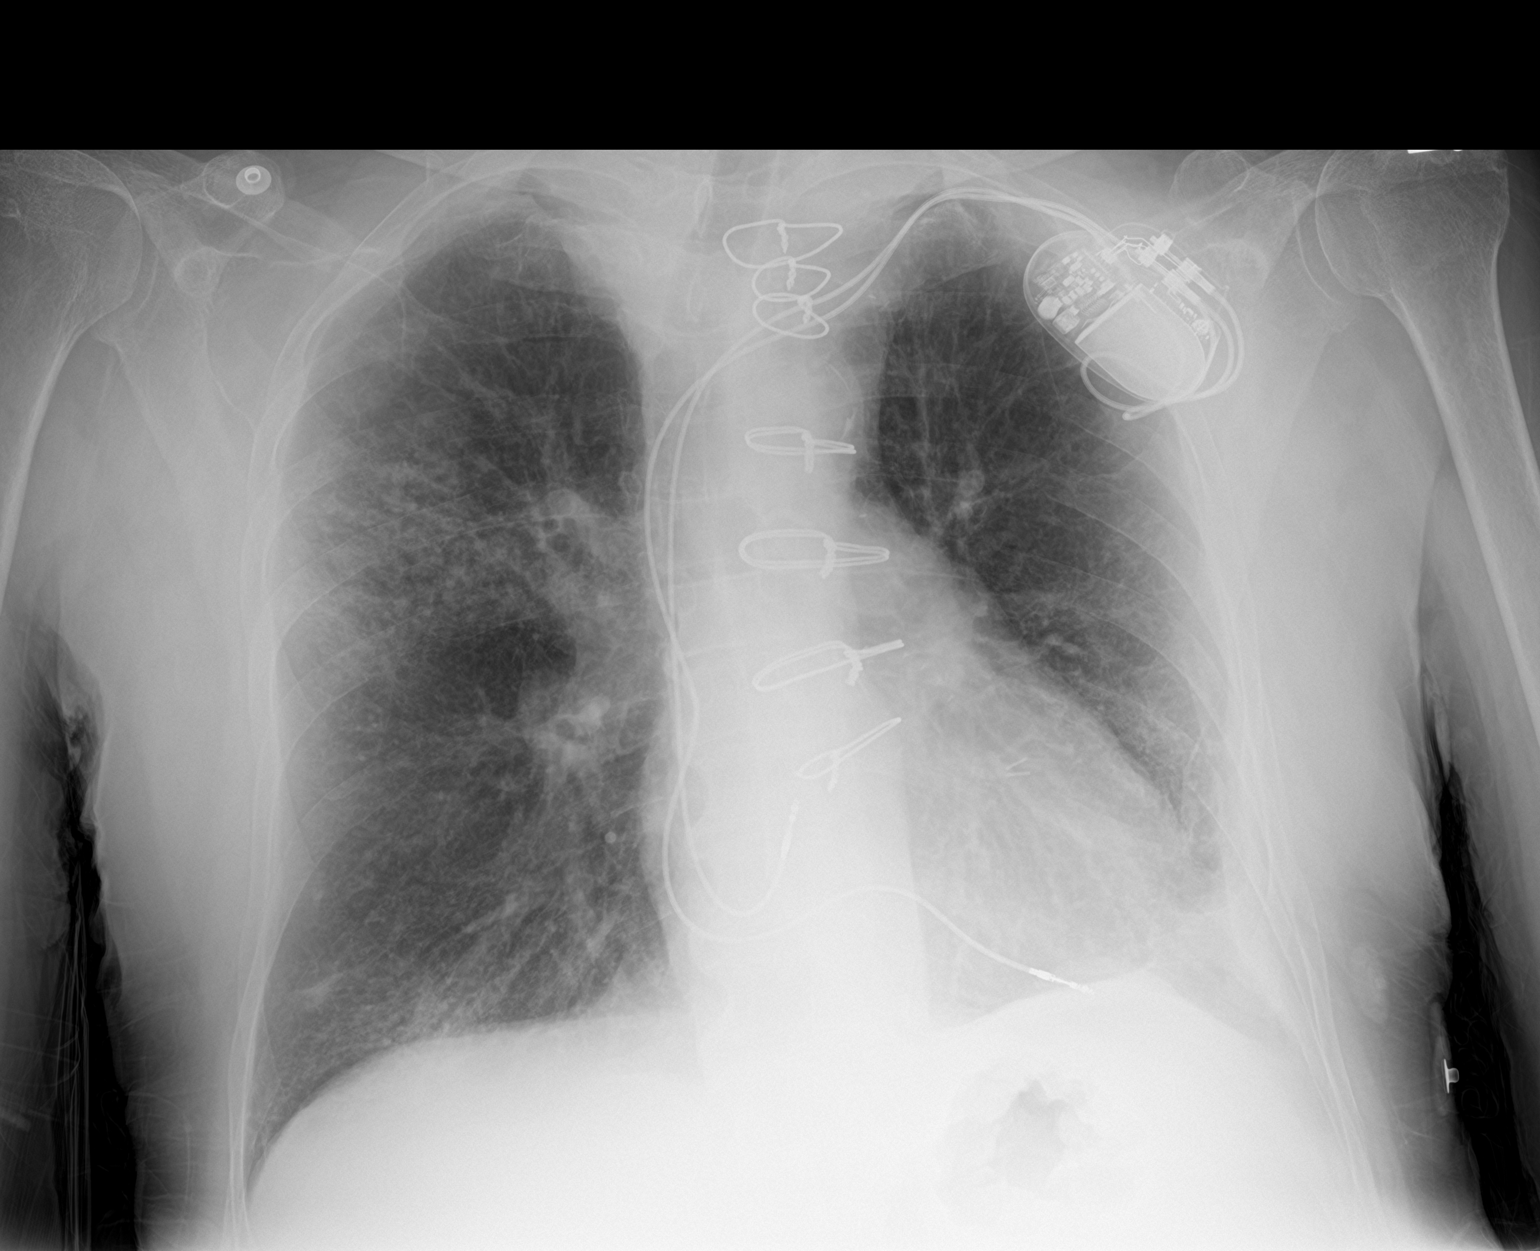

[chest lat]
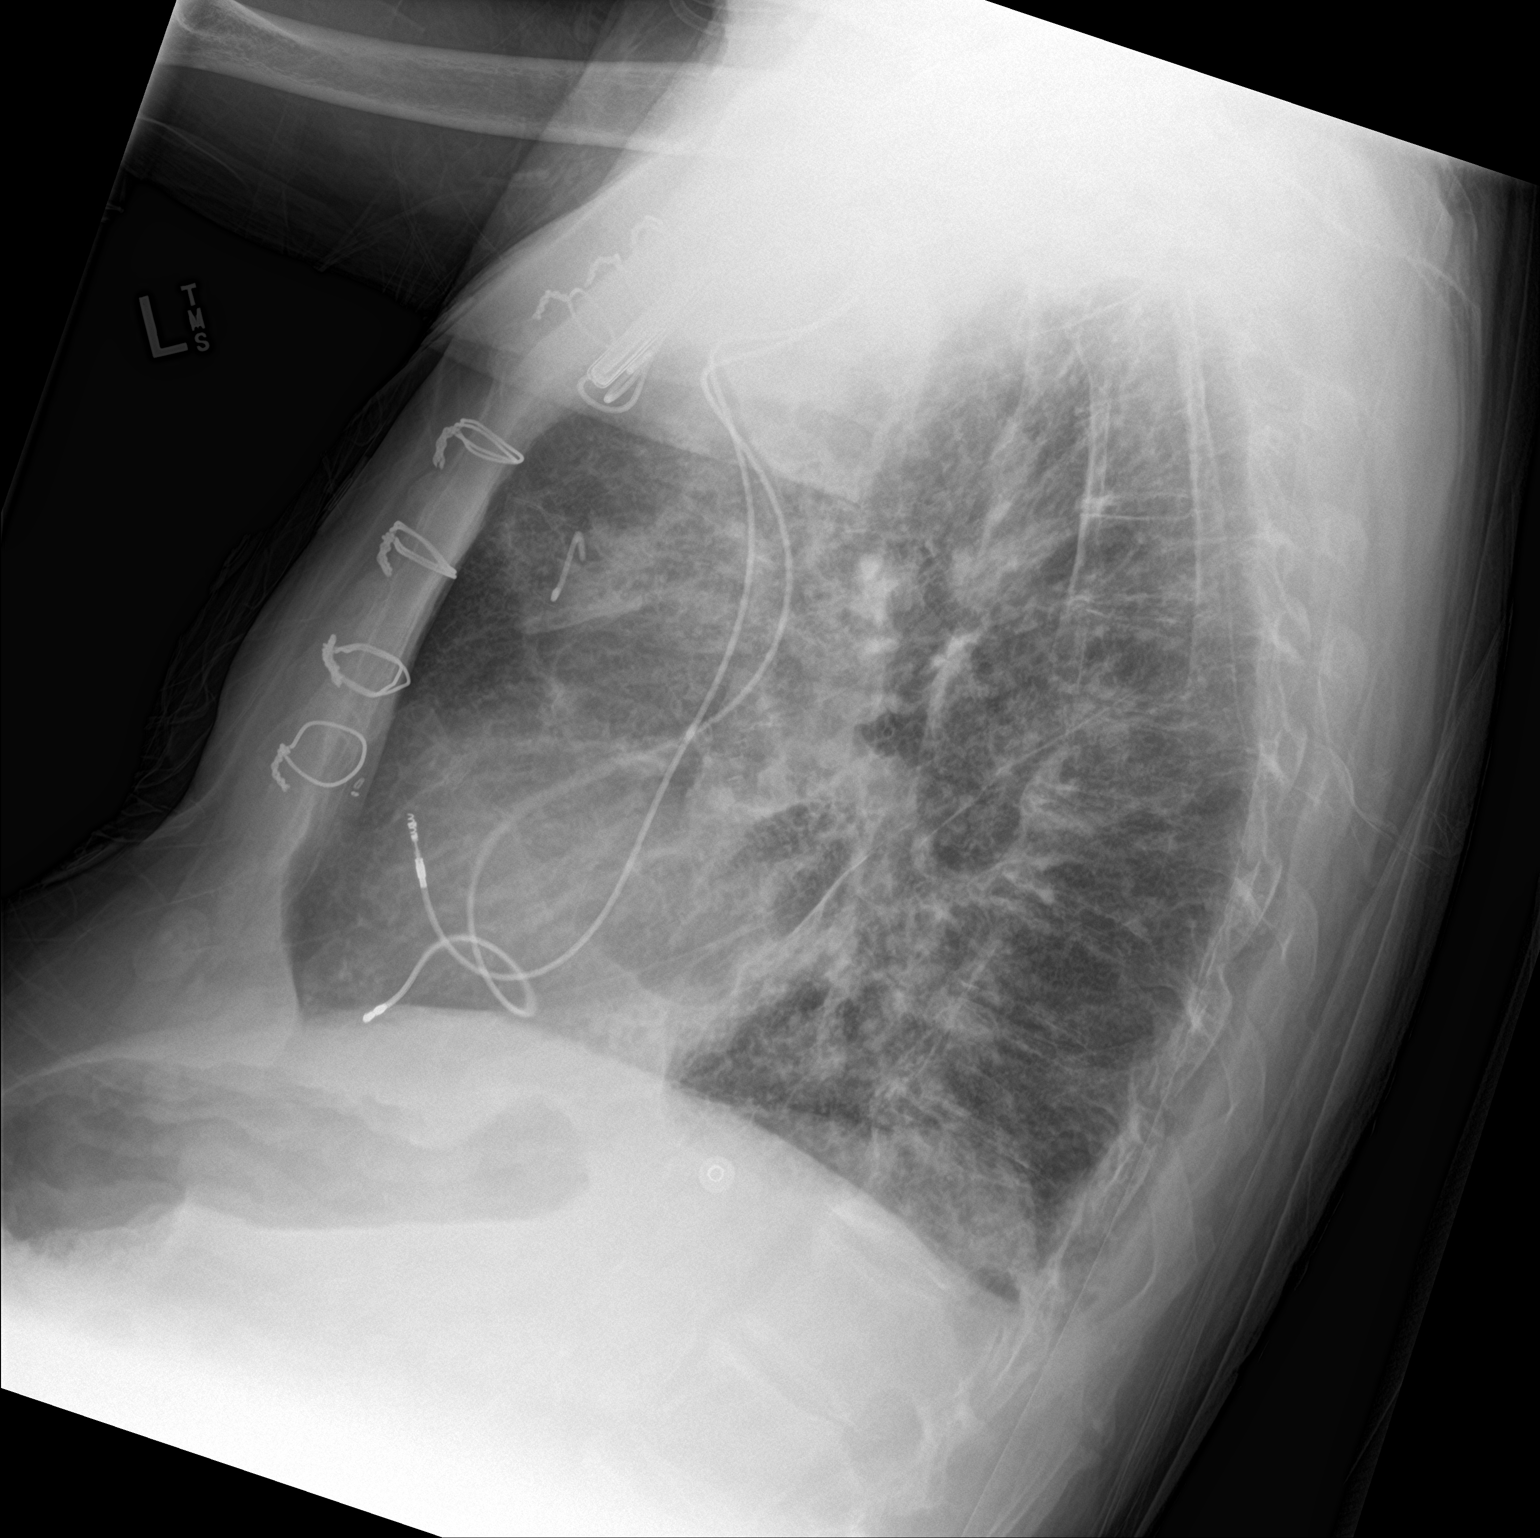

[2 of 2 positions shown; findings below may reference images not displayed]

FINDINGS: Borderline enlarged cardiac silhouette. Stable post CABG changes.
Stable left subclavian bipolar pacemaker leads.

No significant change in probable pleural and parenchymal scarring
at the left lung base, laterally. Interval patchy opacity in the
right upper lobe. Diffuse peribronchial thickening and accentuation
of the interstitial markings with mild hyperexpansion of the lungs.a
there is also an interval small irregular nodular density at the
right lateral lung base. Thoracic spine degenerative changes.
IMPRESSION: 1. Right upper lobe pneumonia.
2. Interval small may irregular nodular density at the right lung
base, laterally. Attention to this area on follow-up chest
radiographs is recommended.
3. Mild changes of COPD and chronic bronchitis with stable left
basilar pleural and parenchymal scarring.

## 2019-01-18 IMAGING — DX DG CHEST 1V PORT
1 series · 2 of 2 positions shown · non-contrast
Comparison: Chest radiograph from one day prior.

CLINICAL DATA: Cardiac arrest, intubated

EXAM:
PORTABLE CHEST 1 VIEW

[Series 1: chest ap · 0.14mm/px · 2 of 2 slices shown]
[im 1/2]
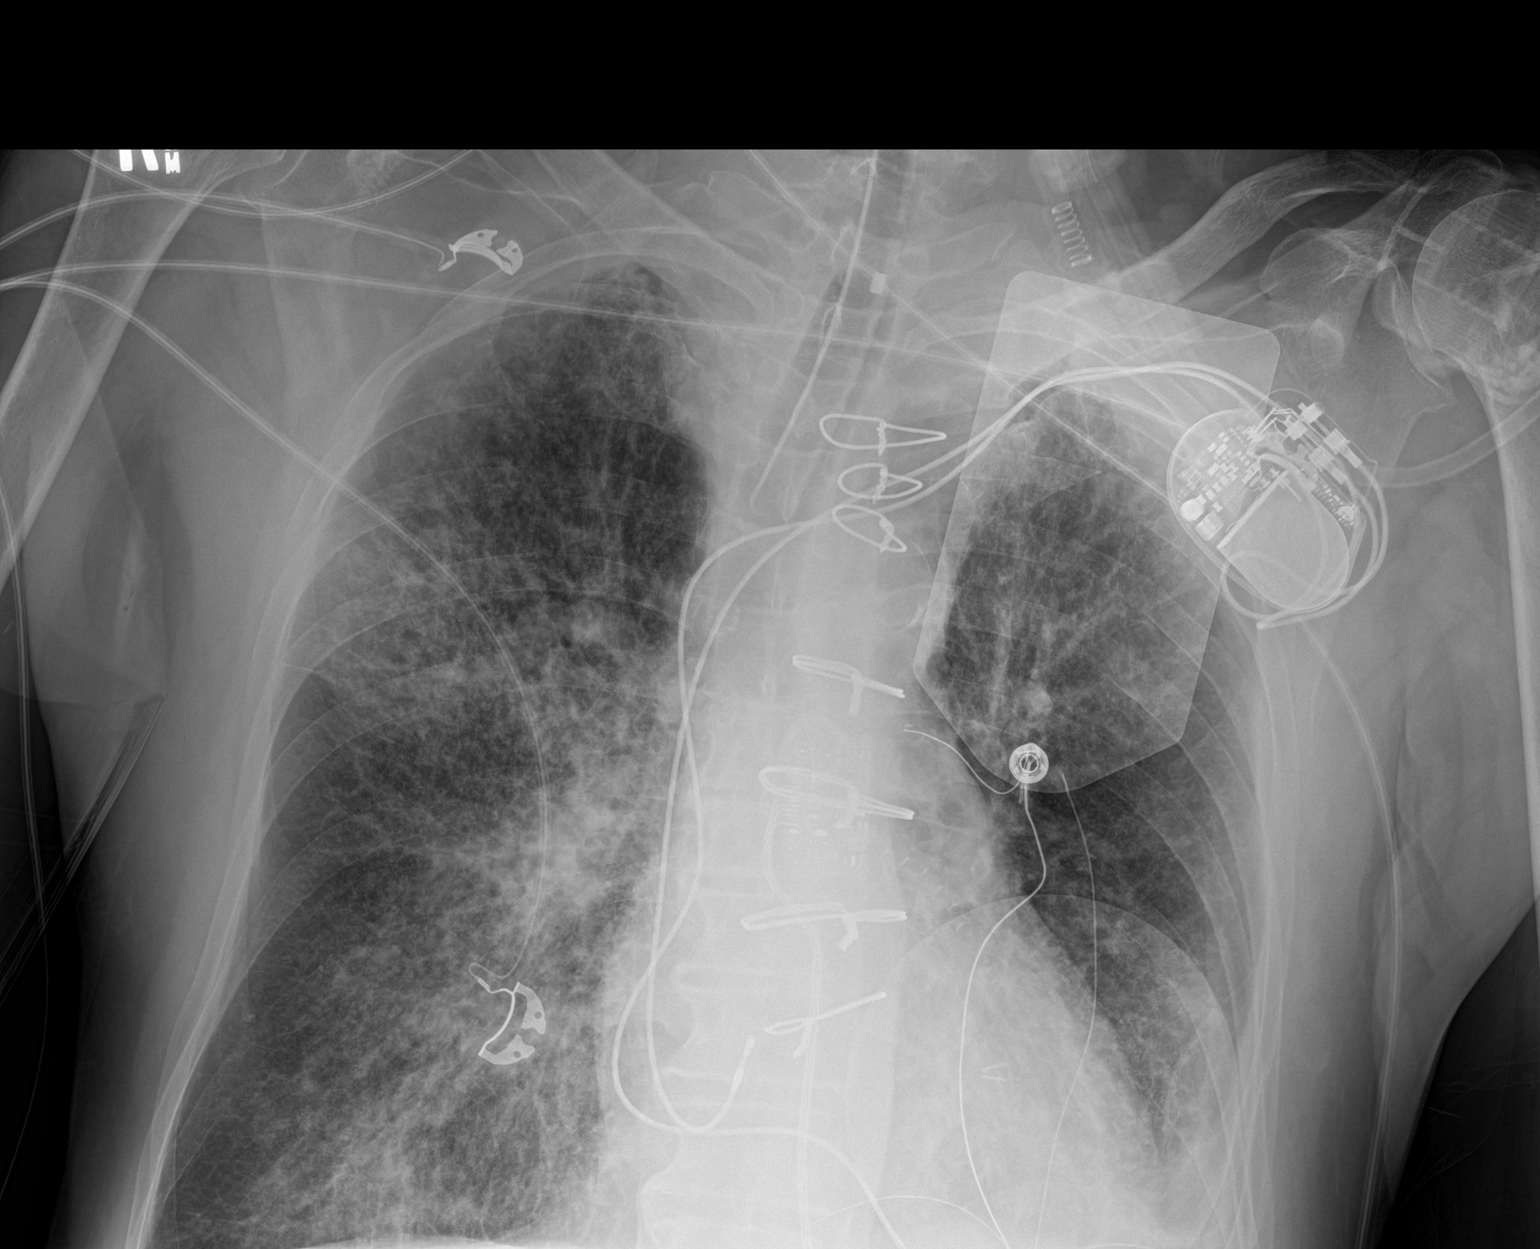
[im 2/2]
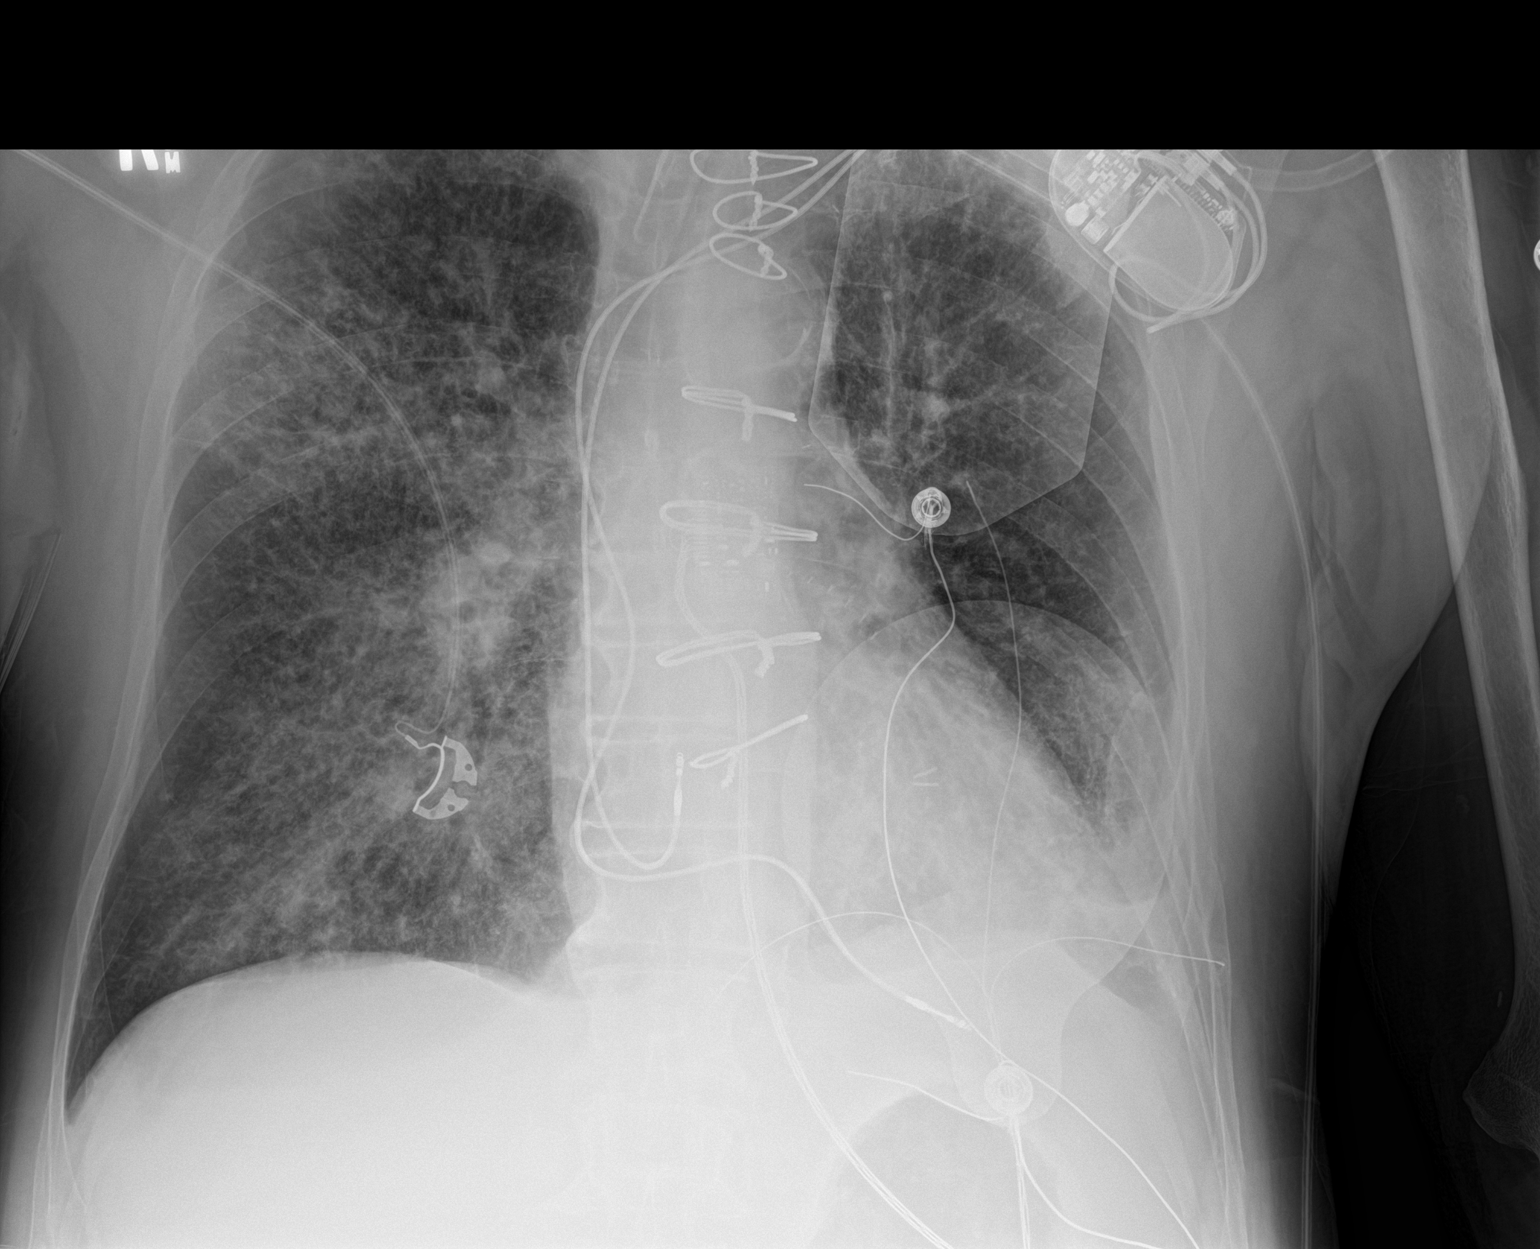

[2 of 2 positions shown; findings below may reference images not displayed]

FINDINGS: Endotracheal tube tip is 4.7 cm above the carina. Pacer pads overlie
the left chest. Intact sternotomy wires. CABG clips overlie the
mediastinum. Stable cardiomediastinal silhouette with normal heart
size. No pneumothorax. No right pleural effusion. Stable small left
pleural effusion. Worsening diffuse patchy parahilar lung opacities.
IMPRESSION: 1. Well-positioned endotracheal tube.
2. Worsening diffuse patchy parahilar lung opacities, probably
pulmonary edema, with a component of aspiration/pneumonia not
excluded.
3. Stable small left pleural effusion.

## 2019-01-20 IMAGING — DX DG CHEST 1V PORT
1 series · 1 of 1 positions shown · non-contrast
Comparison: Earlier today

CLINICAL DATA: Shortness of breath

EXAM:
PORTABLE CHEST 1 VIEW

[chest ap]
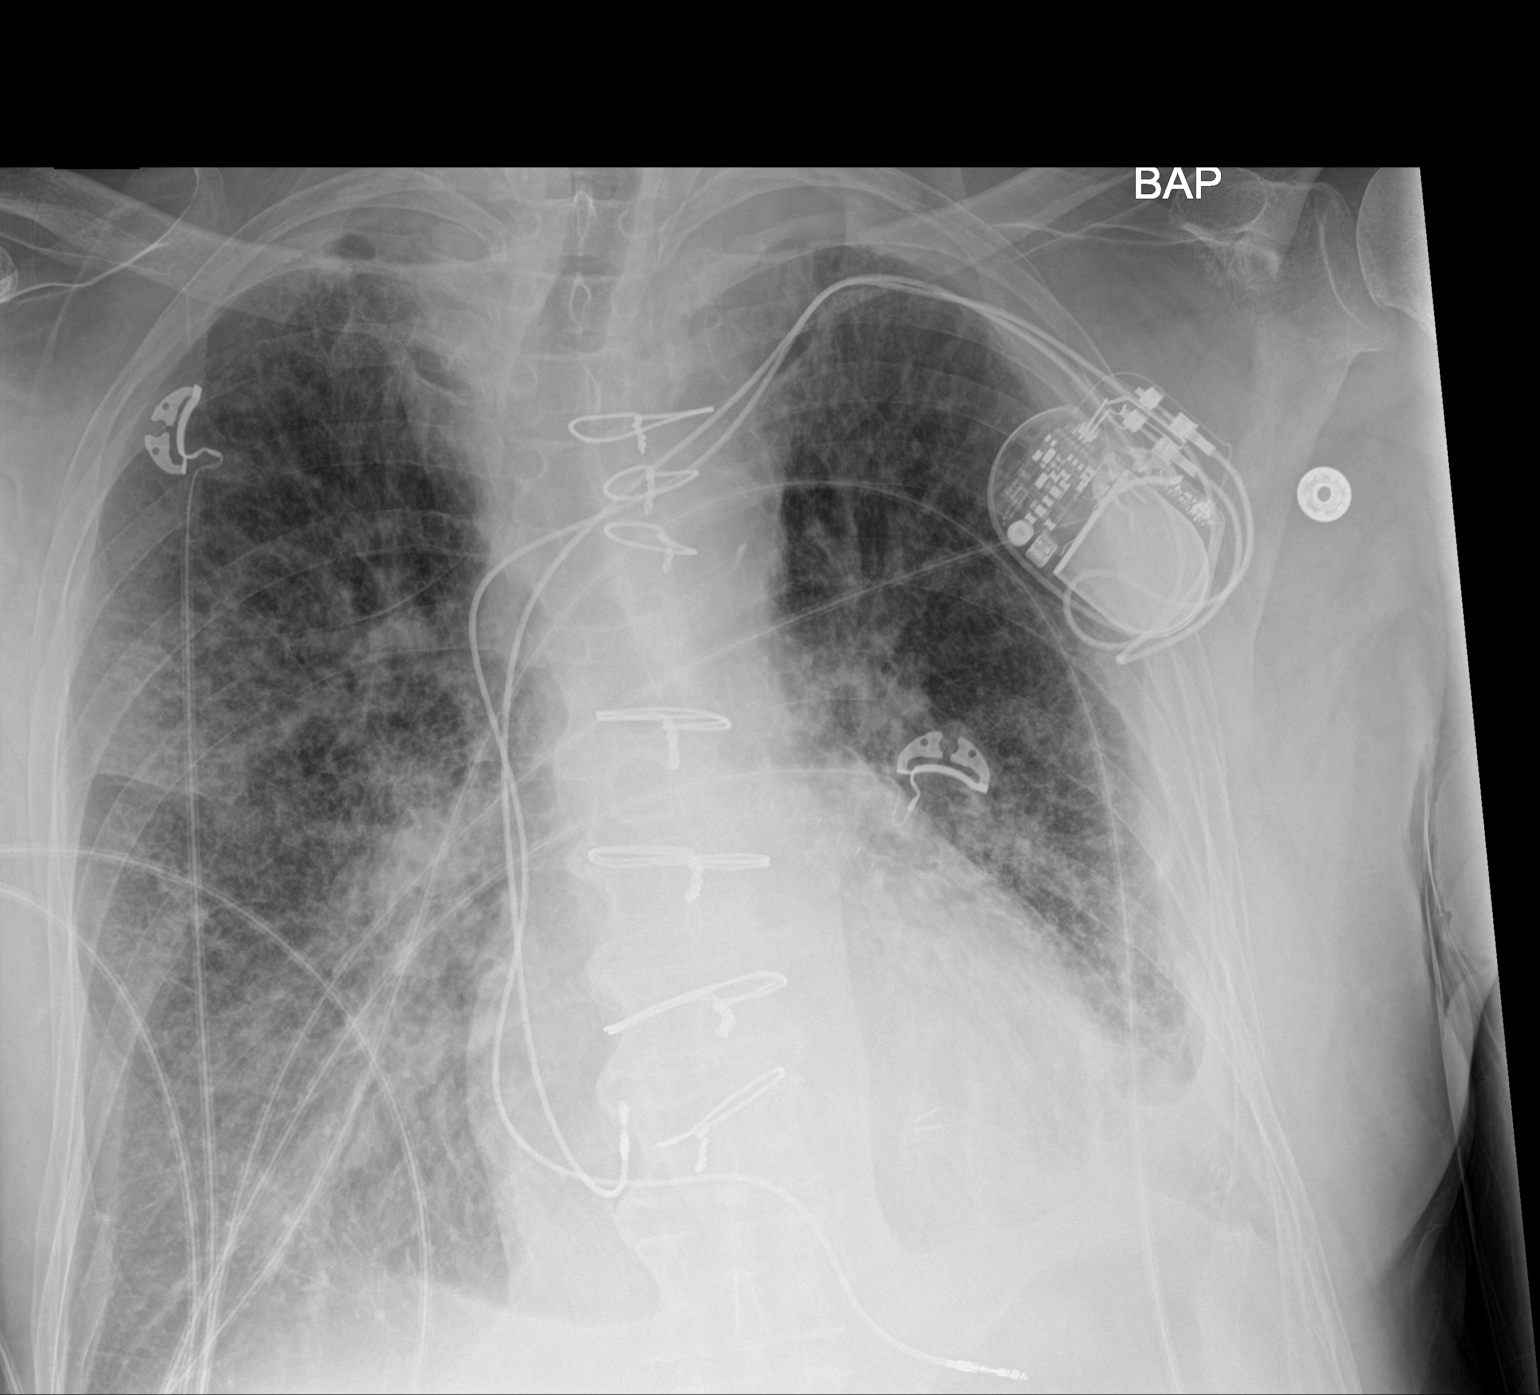

[1 of 1 positions shown; findings below may reference images not displayed]

FINDINGS: Diffuse interstitial opacity that is similar to prior. Small left
effusion. Chronic cardiomegaly. Dual-chamber pacer leads from the
left. The patient is status post CABG and left carotid stenting.
Chronic apical pleural thickening. No pneumothorax.
IMPRESSION: CHF pattern without progression from earlier today.

## 2019-01-20 IMAGING — DX DG CHEST 1V PORT
1 series · 1 of 1 positions shown · non-contrast
Comparison: Chest x-ray of December 26, 2017

CLINICAL DATA: Acute respiratory failure, shortness of breath.
History of COPD, hospital and community acquired pneumonia, cardiac
arrest

EXAM:
PORTABLE CHEST 1 VIEW

[chest]
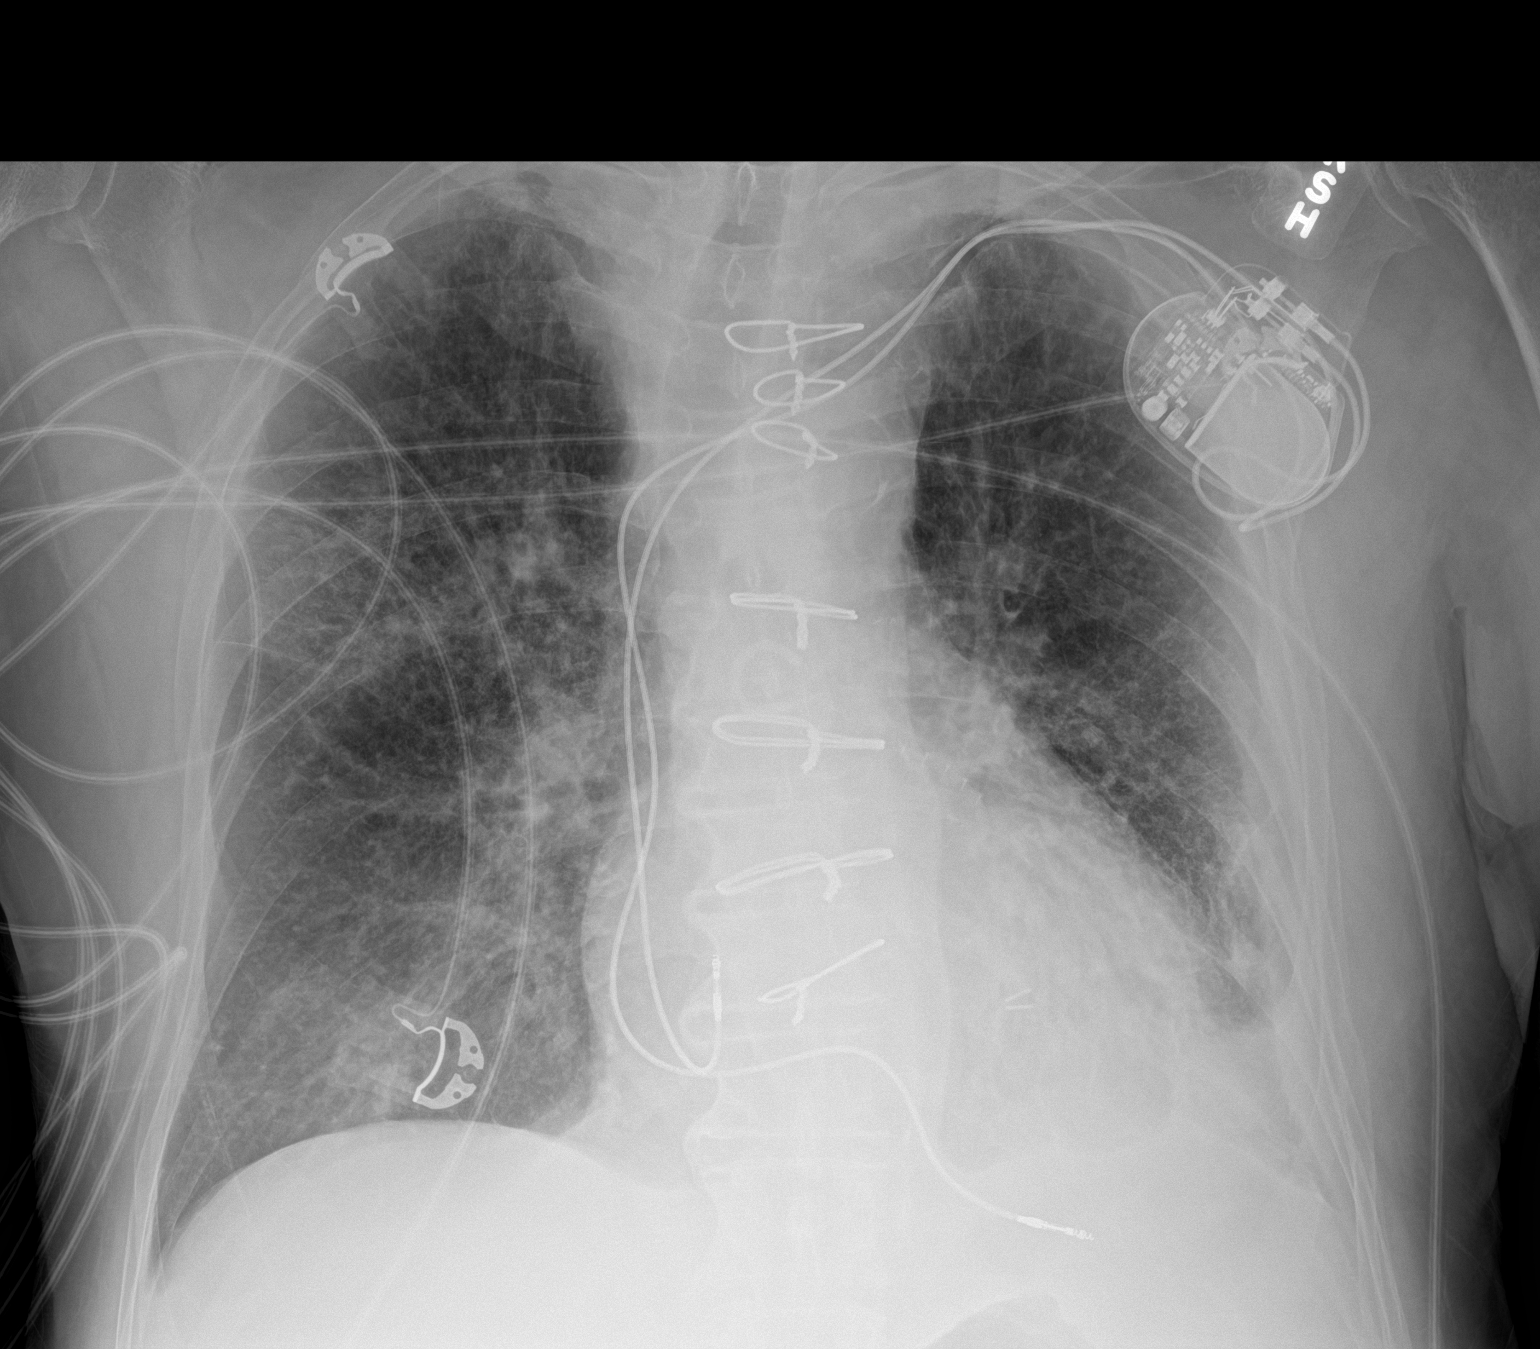

[1 of 1 positions shown; findings below may reference images not displayed]

FINDINGS: There has been interval extubation of the trachea. The lungs are
well-expanded. The interstitial markings remain increased. Confluent
density at the left lung base persists. Areas of near confluent
density in the right mid and lower lung persist as well. The heart
is top-normal in size. The pulmonary vascularity is not clearly
engorged. The sternal wires are intact. The ICD is in stable
position. There is calcification in the wall of the aortic arch.
IMPRESSION: Slightly decreased pulmonary vascular congestion. Persistent but
slightly improved interstitial edema bilaterally. Left basilar
atelectasis or pneumonia.

## 2019-02-05 ENCOUNTER — Telehealth: Payer: Self-pay

## 2019-02-05 NOTE — Telephone Encounter (Signed)
Pts wife called nurse line stating pts insurance needs a letter from provider stating he has chronic heart failure. Per wife, patients insurance will drop him without the proper documentation. Pts wife stated she was also under the impression the insurance company was going to fax something about this to provider. I checked providers box and the paperwork is in there. Please advise.

## 2019-02-06 NOTE — Telephone Encounter (Signed)
Patient wife called anxious for form to be signed so insurance isn't lapsed. Filled out and given to PCP. Signature obtained. [name] wants to pick up. Placed at front desk, copy made for batch scanning. Danley Danker, RN [facility] RN)

## 2019-02-06 NOTE — Telephone Encounter (Signed)
Thank you, Delrae Rend!

## 2019-02-28 ENCOUNTER — Other Ambulatory Visit: Payer: Self-pay

## 2019-02-28 NOTE — Patient Outreach (Signed)
  [facility]) Care Management Chronic Special Needs Program  02/28/2019  Name: Joe Wiggins DOB: 1946-12-20  MRN: [id number]  [name] is enrolled in a chronic special needs plan for  Heart Failure. A completed health risk assessment has not been received from the client and client has not responded to outreach attempts by their health care concierge.  The client's individualized care plan was developed based on available data.  Plan:  . Send unsuccessful outreach letter with a copy of individualized care plan to client . Send individualized care plan to provider . Send education material related to history of heart disease, atrial fibrillation, COPD hypertension, TIA and stroke.  Chronic care management coordinator will attempt outreach in 2-4 months.  Thea Silversmith, RN, MSN, [facility] [phone]

## 2019-03-13 ENCOUNTER — Telehealth: Payer: Self-pay | Admitting: Internal Medicine

## 2019-03-13 ENCOUNTER — Other Ambulatory Visit: Payer: Self-pay | Admitting: *Deleted

## 2019-03-13 MED ORDER — APIXABAN 5 MG PO TABS: 5.0000 mg | ORAL_TABLET | Freq: Two times a day (BID) | ORAL | 1 refills | Status: DC

## 2019-03-13 MED ORDER — APIXABAN 5 MG PO TABS: 5.0000 mg | ORAL_TABLET | Freq: Two times a day (BID) | ORAL | 5 refills | Status: DC

## 2019-03-13 NOTE — Telephone Encounter (Signed)
Follow Up:   Joe Wiggins from [facility] wants to know if they can do  #[phone] and refills for the pt's Eliquis?

## 2019-03-13 NOTE — Telephone Encounter (Signed)
Weight 79.8kg, age 73, SCr 1.96 on 10/15/18 in [place]. Last OV 01/16/19, anticoag indication - afib.

## 2019-03-14 ENCOUNTER — Other Ambulatory Visit: Payer: Self-pay | Admitting: *Deleted

## 2019-03-14 MED ORDER — AMIODARONE HCL 200 MG PO TABS: ORAL_TABLET | ORAL | 2 refills | Status: DC

## 2019-03-15 ENCOUNTER — Telehealth: Payer: Self-pay | Admitting: Internal Medicine

## 2019-03-15 NOTE — Telephone Encounter (Signed)
New Message    Pts wife is calling because the pt normally walks the mall to get exercise and he still needs to get exercise . She is wondering if its still okay that he goes and walks the mall  Please call

## 2019-03-15 NOTE — Telephone Encounter (Signed)
Returned call to patient's wife and made her aware that the patient should avoid going out to areas that accommodate large groups of people in order to decrease risk of exposure given the current situation with COVID-19. Advised for the patient to try and do activities around the house to keep stamina up.She verbalized understanding and thanked me for the call.

## 2019-03-18 ENCOUNTER — Other Ambulatory Visit: Payer: Self-pay

## 2019-03-18 MED ORDER — ATORVASTATIN CALCIUM 40 MG PO TABS: 40.0000 mg | ORAL_TABLET | Freq: Every day | ORAL | 3 refills | Status: DC

## 2019-03-22 ENCOUNTER — Telehealth: Payer: Self-pay | Admitting: *Deleted

## 2019-03-22 NOTE — Telephone Encounter (Signed)
Patient is scheduled for a defib check on 03/25/19. Noted that patient has a Merlin monitor that is up to date via cellular adapter. Pt was last seen by Dr. Lovena Le on 01/16/19, 5.4 years remaining on battery. In-office f/u is not necessary at this time as pt is remotely monitored.  Will plan to cancel defib check appointment and schedule first remote transmission for 04/18/19 (should be automatic).

## 2019-03-22 NOTE — Telephone Encounter (Signed)
Spoke w/ pt wife and informed her that pt in office visit for 03/25/2019 is not needed and that we will plan to start following pt device remotely on 04/18/2019 pt wife verbalized understanding.

## 2019-03-29 ENCOUNTER — Encounter: Payer: PPO | Admitting: Nurse Practitioner

## 2019-04-18 ENCOUNTER — Ambulatory Visit (INDEPENDENT_AMBULATORY_CARE_PROVIDER_SITE_OTHER): Payer: HMO | Admitting: *Deleted

## 2019-04-18 ENCOUNTER — Other Ambulatory Visit: Payer: Self-pay

## 2019-04-18 DIAGNOSIS — I5022 Chronic systolic (congestive) heart failure: Secondary | ICD-10-CM

## 2019-04-18 DIAGNOSIS — I480 Paroxysmal atrial fibrillation: Secondary | ICD-10-CM

## 2019-04-19 ENCOUNTER — Telehealth: Payer: Self-pay

## 2019-04-19 ENCOUNTER — Telehealth: Payer: Self-pay | Admitting: Cardiology

## 2019-04-19 NOTE — Telephone Encounter (Signed)
Pt wife called and left a VM to call back for help w/ the monitor.   Called pt wife back. Instructed her how to send a manual transmission /w the monitor. Transmission received.

## 2019-04-19 NOTE — Telephone Encounter (Signed)
Spoke with patient to remind of missed remote transmission 

## 2019-04-20 LAB — CUP PACEART REMOTE DEVICE CHECK
Date Time Interrogation Session: 20200424235934
Implantable Lead Implant Date: 20140217
Implantable Lead Implant Date: 20190104
Implantable Lead Location: 753859
Implantable Lead Location: 753860
Implantable Lead Model: 7122
Implantable Pulse Generator Implant Date: 20190104
Pulse Gen Serial Number: 9777027

## 2019-04-26 NOTE — Progress Notes (Signed)
Remote ICD transmission.   

## 2019-05-13 ENCOUNTER — Ambulatory Visit: Payer: Self-pay

## 2019-05-14 DIAGNOSIS — H43811 Vitreous degeneration, right eye: Secondary | ICD-10-CM | POA: Diagnosis not present

## 2019-05-14 DIAGNOSIS — H1851 Endothelial corneal dystrophy: Secondary | ICD-10-CM | POA: Diagnosis not present

## 2019-05-23 ENCOUNTER — Ambulatory Visit: Payer: Self-pay

## 2019-05-29 ENCOUNTER — Other Ambulatory Visit: Payer: Self-pay

## 2019-05-29 NOTE — Patient Outreach (Signed)
  [facility]) Care Management Chronic Special Needs Program  05/29/2019  Name: Joe Wiggins DOB: 1946/12/05  MRN: [id number]  [name] is enrolled in a chronic special needs plan for Heart Failure. Chronic Care Management Coordinator telephoned client to review health risk assessment and to develop individualized care plan.  Introduced the chronic care management program, importance of client participation, and taking their care plan to all provider appointments and inpatient facilities.    RNCM spoke with client two patient identifiers confirmed. Client request RNCM speak with his wife for assessment.   Subjective: Per Joe Wiggins, states client has a history of dementia, HF, Heart disease, atrial fibrillation; COPD, THN, stroke/TIA. per Joe Wiggins, client is independent for ADL's. She is managing his medications, and denies any questions. She states client is currently seeing Dr. Crissie Sickles and is following up with Dr. Lovena Le regarding his heart failure diagnosis. She acknowledges a HF action plan and when to call. Client weighs daily and she records client's weight. Client is on greater than 8 prescription medications-will refer to pharmacy for medication review. She is receptive to dementia education, but states client is doing well now and denies any issues.   Goals Addressed            This Visit's Progress   . Client understands the importance of follow-up with providers by attending scheduled visits   On track   . Client verbalize knowledge of Heart Failure disease self management skills       . Client will not report change from baseline and no repeated symptoms of stroke with in the next 6-9 months.    On track   . Client will report no fall or injuries in the next 6-9 months.   On track   . Client will report no worsening of symptoms of Atrial Fibrillation within the next 6-9 months   On track   . Client will verbalize knowledge of chronic lung disease as evidenced  by no ED visits or Inpatient stays related to chronic lung disease    On track   . Client will verbalize knowledge of self management of Hypertension as evidences by BP reading of 140/90 or less; or as defined by provider   On track   . Client/Caregiver will verbalize understanding of instructions related to self-care and safety   On track   . Maintain timely refills of Heart Failure medication as prescribed within the year    On track   . Obtain annual  Lipid Profile, LDL-C   On track   . Visit Primary Care Provider or Cardiologist at least 2 times per year   On track      Plan:  Send successful outreach letter with a copy of their individualized care plan, Send individual care plan to provider and Send educational material. Chronic care management coordination will outreach in: 6 months. Will refer client to:  Pharmacy  Thea Silversmith, RN, MSN, [place] [phone]

## 2019-06-03 ENCOUNTER — Ambulatory Visit (INDEPENDENT_AMBULATORY_CARE_PROVIDER_SITE_OTHER): Payer: HMO | Admitting: Family Medicine

## 2019-06-03 ENCOUNTER — Other Ambulatory Visit: Payer: Self-pay

## 2019-06-03 ENCOUNTER — Encounter: Payer: Self-pay | Admitting: Family Medicine

## 2019-06-03 VITALS — BP 132/68 | HR 73

## 2019-06-03 DIAGNOSIS — H02846 Edema of left eye, unspecified eyelid: Secondary | ICD-10-CM

## 2019-06-03 MED ORDER — DIPHENHYDRAMINE HCL 2 % EX CREA: TOPICAL_CREAM | Freq: Three times a day (TID) | CUTANEOUS | 0 refills | Status: DC | PRN

## 2019-06-03 NOTE — Assessment & Plan Note (Addendum)
Patient presents with unilateral left lower eyelid swelling with redness.  Symptoms have been present for 3 days.  Patient denies any pain or pruritus associated with swelling.  Patient started using a new Vicks product that he uses under his eyes before bed which may have caused an allergic reaction.  Urinary unilaterality is unusual for allergy reaction with possible.  No signs of eye or orbital infection.  Given worsening symptoms in the morning with improvement during the day suspect allergic reaction. Patient will stop using cream/gel.  Prescribed antihistamine cream to be applied under his left eye for the next few days as needed.  He will follow-up on a PRN basis.

## 2019-06-03 NOTE — Progress Notes (Signed)
Subjective:    Patient ID: Joe Wiggins, male    DOB: 09/10/1946, 73 y.o.   MRN: [id number]   CC: Left lower eyelid swelling  HPI: Patient is a 73 year old male who presents today complaining of left lower eyelid swelling and redness for the past 3 to 4 days.  Wife and patient reports that they have noticed increased puffiness and redness in the morning which gradually improves by midday.  Patient denies any similar presentation in the past.  Patient denies any pain, itching, fevers, chills, nausea, vomiting, eye drainage, change in vision or pain with eye movement.  Wife reports that patient has been using a new Vicks product with lavender that patient rubs under his eyes before bed.  Patient denies any insect bite or exposure to plant such as poison ivy.  Smoking status reviewed   ROS: all other systems were reviewed and are negative other than in the HPI   Past Medical History:  Diagnosis Date  . AF (paroxysmal atrial fibrillation) ([place])   . AICD (automatic cardioverter/defibrillator) present   . CAD (coronary artery disease)   . Cardiomyopathy, ischemic   . Carotid artery occlusion   . Chronic kidney disease   . COPD (chronic obstructive pulmonary disease) ([place])   . Dementia without behavioral disturbance ([place])   . GERD (gastroesophageal reflux disease)   . Hyperlipidemia   . Hypertension   . Pneumonia   . Sleep apnea   . Stroke ([place])   . TIA (transient ischemic attack)     Past Surgical History:  Procedure Laterality Date  . CARDIAC PACEMAKER PLACEMENT    . CATARACT EXTRACTION    . CORONARY ARTERY BYPASS GRAFT    . CORONARY STENT PLACEMENT    . EYE SURGERY    . ICD IMPLANT N/A 12/29/2017   Procedure: ICD IMPLANT;  Surgeon: Evans Lance, MD;  Location: [facility];  Service: Cardiovascular;  Laterality: N/A;  . LEFT HEART CATH AND CORS/GRAFTS ANGIOGRAPHY N/A 12/29/2017   Procedure: LEFT HEART CATH AND CORS/GRAFTS ANGIOGRAPHY;  Surgeon: Martinique, Peter M, MD;   Location: [facility];  Service: Cardiovascular;  Laterality: N/A;    Past medical history, surgical, family, and social history reviewed and updated in the EMR as appropriate.  Objective:  BP 132/68   Pulse 73   SpO2 98%   Vitals and nursing note reviewed  General: NAD, pleasant, able to participate in exam HEENT: Left lower eyelid swelling with mild erythema, no fluctuance drainage. Cardiac: RRR, normal heart sounds, no murmurs. 2+ radial and PT pulses bilaterally Respiratory: CTAB, normal effort, No wheezes, rales or rhonchi Abdomen: soft, nontender, nondistended, no hepatic or splenomegaly, +BS Extremities: no edema or cyanosis. WWP. Skin: warm and dry, no rashes noted Neuro: alert and oriented x4, no focal deficits Psych: Normal affect and mood   Assessment & Plan:   Swollen eyelid, left Patient presents with unilateral left lower eyelid swelling with redness.  Symptoms have been present for 3 days.  Patient denies any pain or pruritus associated with swelling.  Patient started using a new Vicks product that he uses under his eyes before bed which may have caused an allergic reaction.  Urinary unilaterality is unusual for allergy reaction with possible.  No signs of eye or orbital infection.  Given worsening symptoms in the morning with improvement during the day suspect allergic reaction. Patient will stop using cream/gel.  Prescribed antihistamine cream to be applied under his left eye for the next few  days as needed.  He will follow-up on a PRN basis.     Marjie Skiff, MD [place] PGY-3

## 2019-06-07 ENCOUNTER — Other Ambulatory Visit: Payer: Self-pay

## 2019-06-07 NOTE — Patient Outreach (Signed)
  [facility]) Care Management Chronic Special Needs Program    06/07/2019  Name: Joe Wiggins, [place]: 1946/06/02  MRN: [id number]   [name] is enrolled in a chronic special needs plan.  Care Coordination: pharmacy referral completed.  Thea Silversmith, RN, MSN, [place] [phone]

## 2019-06-11 ENCOUNTER — Telehealth: Payer: Self-pay | Admitting: Pharmacist

## 2019-06-11 ENCOUNTER — Encounter: Payer: Self-pay | Admitting: Pharmacist

## 2019-06-11 NOTE — Addendum Note (Signed)
Addended by: Elayne Guerin on: 06/11/2019 04:27 PM   Modules accepted: Orders

## 2019-06-11 NOTE — Patient Outreach (Addendum)
[facility]) Care Management  06/11/2019  Joe Wiggins 01-08-1946 [id number]  Patient was called regarding CSNP med review and patient assistance evaluation. Unfortunately, he did not answer the phone. HIPAA compliant message was left on the patient's voicemail.  Plan: Send unsuccessful contact letter. Call patient back in 7-14  business days.   Elayne Guerin, PharmD, BCACP [facility] Clinical Pharmacist [phone]  ADDENDUM  [name] called me back.  Joe Wiggins/[facility]) Care Management  [place]   06/12/2019  Joe Wiggins [birth year] [id number]  Reason for referral: Medication Assistance, Medication Review  Referral source: [employer] Care Manager with [facility] Current insurance: [employer]  PMHx includes but not limited to:   Bilateral carotid stenosis, CAD, anemia, CKD stage III,  COPD, Hyperlipidemia, ICD defibrillator placement, A fib, Seasonal allergies, sleep apnea and vascular dementia.  Outreach:  Successful telephone call with patient's wife (Joe Wiggins).  HIPAA identifiers verified.   Subjective:  [name] manages his medications and uses a weekly pill box as an adherence strategy.  Does the patient ever forget to take medication?  no Does the patient have problems obtaining medications due to transportation?   no Does the patient have problems obtaining medications due to cost?  no  Does the patient feel that medications prescribed are effective?  yes Does the patient ever experience any side effects to the medications prescribed?  no   Objective: The 10-year ASCVD risk score Mikey Bussing DC Jr., et al., 2013) is: 29.2%   Values used to calculate the score:     Age: 73 years     Sex: Male     Is Non-Hispanic African American: No     Diabetic: No     Tobacco smoker: No     Systolic Blood Pressure: 485 mmHg     Is BP treated: Yes     HDL Cholesterol: 32 mg/dL     Total Cholesterol: 146  mg/dL  Lab Results  Component Value Date   CREATININE 2.09 (H) 03/20/2018   CREATININE 2.1 (A) 01/10/2018   CREATININE 2.19 (H) 01/02/2018    Lab Results  Component Value Date   HGBA1C 5.6 12/26/2017    Lipid Panel     Component Value Date/Time   CHOL 146 12/26/2017 0845   TRIG 166 (H) 12/26/2017 0845   HDL 32 (L) 12/26/2017 0845   CHOLHDL 4.6 12/26/2017 0845   VLDL 33 12/26/2017 0845   LDLCALC 81 12/26/2017 0845    BP Readings from Last 3 Encounters:  06/03/19 132/68  01/16/19 128/70  01/01/19 (!) 163/70    Allergies  Allergen Reactions  . Ipratropium Other (See Comments)  . Morphine And Related Nausea Only  . Vicodin [Hydrocodone-Acetaminophen] Other (See Comments)    Hallucinations   . Penicillins Rash    Medications Reviewed Today    Reviewed by Elayne Guerin, [facility] (Pharmacist) on 06/11/19 at 1604  Med List Status: <None>  Medication Order Taking? Sig Documenting Provider Last Dose Status Informant  albuterol (ACCUNEB) 0.63 MG/3ML nebulizer solution [id number] Yes 3 ml inhale orally via nebulizer four times a day [provider] Taking Active            Med Note Juleen China, Deno Etienne   Wed May 29, 2019 11:53 AM) As needed  albuterol (PROVENTIL HFA;VENTOLIN HFA) 108 (90 Base) MCG/ACT inhaler [id number] Yes Inhale 2 puffs into the lungs every 6 (six) hours as needed.  [provider] Taking Active Other  amiodarone (PACERONE) 200 MG tablet [id number] Yes Take one tablet by mouth daily Monday through Saturday.  DO NOT take on Sunday. Evans Lance, MD Taking Active   apixaban [facility] [place]) 5 MG TABS tablet [id number] Yes Take 1 tablet (5 mg total) by mouth 2 (two) times daily. Evans Lance, MD Taking Active   atorvastatin (LIPITOR) 40 MG tablet [id number] Yes Take 1 tablet (40 mg total) by mouth daily. Evans Lance, MD Taking Active   cholecalciferol (VITAMIN D) 1000 units tablet [id number] Yes Take 1,000 Units by mouth daily. [provider]  Taking Active Other  Cyanocobalamin (VITAMIN B 12 PO) [id number] Yes Take by mouth daily. [provider] Taking Active Other  diphenhydrAMINE (BENADRYL) 2 % cream [id number] Yes Apply topically 3 (three) times daily as needed for itching. Marjie Skiff, MD Taking Active   famotidine (PEPCID) 20 MG tablet [id number] Yes Take 20 mg by mouth daily. [provider] Taking Active Other           Med Note Juleen China, Deno Etienne   Wed May 29, 2019 11:54 AM) Dewaine Conger as needed.  ferrous sulfate 325 (65 FE) MG tablet [id number] Yes Take 325 mg by mouth daily with breakfast. [provider] Taking Active   Fluticasone-Salmeterol (ADVAIR) 250-50 MCG/DOSE AEPB [id number] Yes Inhale 1 puff into the lungs 2 (two) times daily. [provider] Taking Active Other  folic acid (FOLVITE) 583 MCG tablet [id number] Yes Take 400 mcg by mouth daily. [provider] Taking Active Other  furosemide (LASIX) 40 MG tablet [id number] Yes Take 40 mg by mouth 2 (two) times daily. [provider] Taking Active   hydrALAZINE (APRESOLINE) 25 MG tablet [id number] Yes Take 1 tablet (25 mg total) by mouth 2 (two) times daily. Evans Lance, MD Taking Active   labetalol (NORMODYNE) 300 MG tablet [id number] Yes Take 1 tablet by mouth 2 (two) times a day. [provider] Taking Active   Multiple Vitamin (MULTIVITAMIN) tablet [id number] Yes Take 1 tablet by mouth daily. [provider] Taking Active Other  nitroGLYCERIN (NITROSTAT) 0.4 MG SL tablet [id number] Yes Place 0.4 mg under the tongue every 5 (five) minutes as needed for chest pain. [provider] Taking Active Other  tiotropium (SPIRIVA HANDIHALER) 18 MCG inhalation capsule [id number] Yes Place 18 mcg into inhaler and inhale daily. [provider] Taking Active Other        Discontinued 29/19/16 6060 (Duplicate)           Assessment: Drugs sorted by system:   Cardiovascular: Eliquis, Atorvastatin,  Amiodarone,  Furosemide, Hydralazine, Labetalol, Nitroglycerin,   Pulmonary/Allergy: Albuterol HFA, Albuterol nebulizer solution, Advair, Spiriva  Topical: Diphenhydramine,   Vitamins/Minerals/Supplements: Cholecalciferol, Cyanocobalamin, Folic Acid, Multiple Vitamin,     Medication Adherence Findings: Adherence Review  [x]  Excellent (no doses missed/week)     []  Good (no more than 1 dose missed/week)     []  Partial (2-3 doses missed/week) []  Poor (>3 doses missed/week)  Patient's caregiver with excellent understanding of regimen and excellent understanding of indications.    Medication Assistance Findings:  No medication assistance needs identified  Extra Help:  Already receiving Full Extra Help Low Income Subsidy  Plan: . Will follow-up in 3 months.Marland Kitchen

## 2019-07-01 ENCOUNTER — Telehealth (HOSPITAL_COMMUNITY): Payer: Self-pay

## 2019-07-01 ENCOUNTER — Other Ambulatory Visit: Payer: Self-pay

## 2019-07-01 DIAGNOSIS — I6522 Occlusion and stenosis of left carotid artery: Secondary | ICD-10-CM

## 2019-07-01 DIAGNOSIS — I6521 Occlusion and stenosis of right carotid artery: Secondary | ICD-10-CM

## 2019-07-01 NOTE — Telephone Encounter (Signed)
The above patient or their representative was contacted and gave the following answers to these questions:         Do you have any of the following symptoms? No  Fever                    Cough                   Shortness of breath  Do  you have any of the following other symptoms? NO   muscle pain         vomiting,        diarrhea        rash         weakness        red eye        abdominal pain         bruising          bruising or bleeding              joint pain           severe headache    Have you been in contact with someone who was or has been sick in the past 2 weeks? NO  Yes                 Unsure                         Unable to assess   Does the person that you were in contact with have any of the following symptoms?   Cough         shortness of breath           muscle pain         vomiting,            diarrhea            rash            weakness           fever            red eye           abdominal pain           bruising  or  bleeding                joint pain                severe headache               Have you  or someone you have been in contact with traveled internationally in th last month? No         If yes, which countries?   Have you  or someone you have been in contact with traveled outside [place] in th last month? NO         If yes, which state and city?   COMMENTS OR ACTION PLAN FOR THIS PATIENT:

## 2019-07-02 ENCOUNTER — Ambulatory Visit: Payer: PPO | Admitting: Family

## 2019-07-02 ENCOUNTER — Other Ambulatory Visit: Payer: Self-pay

## 2019-07-02 ENCOUNTER — Ambulatory Visit (HOSPITAL_COMMUNITY)
Admission: RE | Admit: 2019-07-02 | Discharge: 2019-07-02 | Disposition: A | Discharge status: Home / Self Care | Payer: HMO | Source: Ambulatory Visit | Attending: Family | Admitting: Family

## 2019-07-02 DIAGNOSIS — I6521 Occlusion and stenosis of right carotid artery: Secondary | ICD-10-CM

## 2019-07-02 DIAGNOSIS — I6522 Occlusion and stenosis of left carotid artery: Secondary | ICD-10-CM | POA: Diagnosis not present

## 2019-07-03 ENCOUNTER — Ambulatory Visit (INDEPENDENT_AMBULATORY_CARE_PROVIDER_SITE_OTHER): Payer: HMO | Admitting: Family

## 2019-07-03 ENCOUNTER — Ambulatory Visit: Payer: Self-pay | Admitting: Pharmacist

## 2019-07-03 ENCOUNTER — Encounter: Payer: Self-pay | Admitting: Family

## 2019-07-03 ENCOUNTER — Other Ambulatory Visit: Payer: Self-pay

## 2019-07-03 ENCOUNTER — Other Ambulatory Visit: Payer: Self-pay | Admitting: Pharmacist

## 2019-07-03 VITALS — BP 154/73 | Temp 97.7°F | Resp 14 | Ht 70.0 in | Wt 180.8 lb

## 2019-07-03 DIAGNOSIS — Z8673 Personal history of transient ischemic attack (TIA), and cerebral infarction without residual deficits: Secondary | ICD-10-CM | POA: Diagnosis not present

## 2019-07-03 DIAGNOSIS — I6521 Occlusion and stenosis of right carotid artery: Secondary | ICD-10-CM

## 2019-07-03 DIAGNOSIS — I6522 Occlusion and stenosis of left carotid artery: Secondary | ICD-10-CM

## 2019-07-03 DIAGNOSIS — Z959 Presence of cardiac and vascular implant and graft, unspecified: Secondary | ICD-10-CM

## 2019-07-03 NOTE — Patient Instructions (Signed)

## 2019-07-03 NOTE — Patient Outreach (Signed)
[facility]) Care Management  07/03/2019  Joe Wiggins 26-Aug-1946 [id number]   Patient was called for CSNP follow up. Unfortunately, he nor his wife answered the phone. HIPAA compliant message was left on their voicemail.    Plan: Call patient back in 3 months for CSNP follow up.   Elayne Guerin, PharmD, [place] Clinical Pharmacist [phone]

## 2019-07-03 NOTE — Progress Notes (Signed)
Chief Complaint: Follow up Extracranial Carotid Artery Stenosis   History of Present Illness  Joe Wiggins is a 73 y.o. male who wasinitiallyreferred by Dr. Wynonia Lawman to establish care. Dr. Luther Parody initial evaluation was on 12-01-15. The patient is s/p left carotid stenting by Dr. Maryjean Morn in 2013. He sufferedastroke during the procedure on postoperative day 1.  He has a known right internal carotid occlusion. It is unclear whether this was associated with a stroke or not.  He has mild neurological deficits including some memory issues, tingling in left fingers and foot,and a slight leftfoot drop. He denies any amaurosis fugax, receptive or expressive aphasia, facial droop or sudden numbness/weakness of his arms or legs.   By his account and his wife's', it appears that he has not has any subsequent neurological events since 2013.  He has a past medical history of paroxysmal atrial fibrillation managed on Eliquis. He has CAD s/p CABG. He has hyperlipidemia managed on a statin and hypertension.  On ROS, he denies any issues with ambulation. He was walking the 3 floors in the mall, rests for 5-10 minutes after each floor level. He now rides his stationary bile 2 hours daily.  He denies chest pain or dyspnea.   I referred pt to Dr. Elyse Hsu at his visit in December 2018, to evaluate thyroid mass noted on carotid duplex; pt saw Dr. Caralyn Guile in 2019 per wife, no intervention was needed per wife, no follow up needed.    Diabetic:no Tobacco CWC:BJSEGB smoker, quitin 2013, started at age 70  Pt meds include: Statin :yes ASA:no Other anticoagulants/antiplatelets:Eliquis, history of atrial fib   Past Medical History:  Diagnosis Date  . AF (paroxysmal atrial fibrillation) ([place])   . AICD (automatic cardioverter/defibrillator) present   . CAD (coronary artery disease)   . Cardiomyopathy, ischemic   . Carotid artery occlusion   . Chronic kidney disease   . COPD  (chronic obstructive pulmonary disease) ([place])   . Dementia without behavioral disturbance ([place])   . GERD (gastroesophageal reflux disease)   . Hyperlipidemia   . Hypertension   . Pneumonia   . Sleep apnea   . Stroke ([place])   . TIA (transient ischemic attack)     Social History Social History   Tobacco Use  . Smoking status: Former Smoker    Types: Cigarettes    Quit date: 11/30/2012    Years since quitting: 6.5  . Smokeless tobacco: Never Used  Substance Use Topics  . Alcohol use: No  . Drug use: No    Family History Family History  Problem Relation Age of Onset  . Hypertension Mother   . Heart disease Mother   . Hypertension Sister   . Heart disease Sister   . Thyroid disease Sister   . Thyroid disease Brother        hyperthyroid    Surgical History Past Surgical History:  Procedure Laterality Date  . CARDIAC PACEMAKER PLACEMENT    . CATARACT EXTRACTION    . CORONARY ARTERY BYPASS GRAFT    . CORONARY STENT PLACEMENT    . EYE SURGERY    . ICD IMPLANT N/A 12/29/2017   Procedure: ICD IMPLANT;  Surgeon: Evans Lance, MD;  Location: [facility];  Service: Cardiovascular;  Laterality: N/A;  . LEFT HEART CATH AND CORS/GRAFTS ANGIOGRAPHY N/A 12/29/2017   Procedure: LEFT HEART CATH AND CORS/GRAFTS ANGIOGRAPHY;  Surgeon: Martinique, Peter M, MD;  Location: [facility];  Service: Cardiovascular;  Laterality: N/A;  Allergies  Allergen Reactions  . Ipratropium Other (See Comments)  . Morphine And Related Nausea Only  . Vicodin [Hydrocodone-Acetaminophen] Other (See Comments)    Hallucinations   . Penicillins Rash    Current Outpatient Medications  Medication Sig Dispense Refill  . albuterol (ACCUNEB) 0.63 MG/3ML nebulizer solution 3 ml inhale orally via nebulizer four times a day    . albuterol (PROVENTIL HFA;VENTOLIN HFA) 108 (90 Base) MCG/ACT inhaler Inhale 2 puffs into the lungs every 6 (six) hours as needed.     Marland Kitchen amiodarone (PACERONE) 200 MG tablet  Take one tablet by mouth daily Monday through Saturday.  DO NOT take on Sunday. 90 tablet 2  . apixaban (ELIQUIS) 5 MG TABS tablet Take 1 tablet (5 mg total) by mouth 2 (two) times daily. 180 tablet 1  . atorvastatin (LIPITOR) 40 MG tablet Take 1 tablet (40 mg total) by mouth daily. 90 tablet 3  . cholecalciferol (VITAMIN D) 1000 units tablet Take 1,000 Units by mouth daily.    . Cyanocobalamin (VITAMIN B 12 PO) Take by mouth daily.    . diphenhydrAMINE (BENADRYL) 2 % cream Apply topically 3 (three) times daily as needed for itching. 15 g 0  . famotidine (PEPCID) 20 MG tablet Take 20 mg by mouth daily.    . ferrous sulfate 325 (65 FE) MG tablet Take 325 mg by mouth daily with breakfast.    . Fluticasone-Salmeterol (ADVAIR) 250-50 MCG/DOSE AEPB Inhale 1 puff into the lungs 2 (two) times daily.    . folic acid (FOLVITE) 716 MCG tablet Take 400 mcg by mouth daily.    . furosemide (LASIX) 40 MG tablet Take 40 mg by mouth 2 (two) times daily.    . hydrALAZINE (APRESOLINE) 25 MG tablet Take 1 tablet (25 mg total) by mouth 2 (two) times daily. 180 tablet 3  . labetalol (NORMODYNE) 300 MG tablet Take 1 tablet by mouth 2 (two) times a day.    . Multiple Vitamin (MULTIVITAMIN) tablet Take 1 tablet by mouth daily.    . nitroGLYCERIN (NITROSTAT) 0.4 MG SL tablet Place 0.4 mg under the tongue every 5 (five) minutes as needed for chest pain.    Marland Kitchen tiotropium (SPIRIVA HANDIHALER) 18 MCG inhalation capsule Place 18 mcg into inhaler and inhale daily.     No current facility-administered medications for this visit.     Review of Systems : See HPI for pertinent positives and negatives.  Physical Examination  Vitals:   07/03/19 1257 07/03/19 1300  BP: (!) 159/71 (!) 154/73  Resp: 14   Temp: 97.7 F (36.5 C)   TempSrc: Temporal   SpO2: 97%   Weight: 180 lb 12.8 oz (82 kg)   Height: 5\' 10"  (1.778 m)    Body mass index is 25.94 kg/m.  General: WDWN male in NAD GAIT: normal Eyes: PERRLA HENT: No  gross abnormalities.  Pulmonary:  Respirations are non-labored, good air movement in all fields, CTAB, no rales, rhonchi, or  wheezing. Cardiac: regular rhythm, no detected murmur. Pacemaker palpable left upper chest.   VASCULAR EXAM Carotid Bruits Right Left   Positive Positive     Abdominal aortic pulse is not palpable. Radial pulses are 2+ palpable and equal.  LE Pulses Right Left       POPLITEAL  not palpable   not palpable       POSTERIOR TIBIAL  1+ palpable   2+ palpable        DORSALIS PEDIS      ANTERIOR TIBIAL not palpable  not palpable     Gastrointestinal: soft, nontender, BS WNL, no r/g, no palpable masses. Musculoskeletal: no muscle atrophy/wasting. M/S 5/5 throughout, extremities without ischemic changes. Skin: No rashes, no ulcers, no cellulitis.   Neurologic:  A&O X 3; appropriate affect, sensation is normal; speech is normal, CN 2-12 intact, pain and light touch intact in extremities, motor exam as listed above. Psychiatric: Normal thought content, mood appropriate to clinical situation.    Assessment: Joe Wiggins is a 73 y.o. male who iss/p left carotid stenting in 2013. He suffered astroke during the procedure and on postoperative day 1. He has a known right internal carotid occlusion.  He has not had any subsequent neurological events since 2013.   His atherosclerotic risk factors include former smoker x 54 years, CAD, atrial fib, CKD, COPD, hypertension and dyslipidemia.  He takes Eliquis and a statin.  He rides his stationary bike 2 hours daily.    DATA  CarotidDuplex(07-03-19): Right Carotid: Evidence consistent with a total occlusion of the right ICA. The                ECA appears >50% stenosed. Left Carotid: Velocities in the left mid ICA are consistent with a 40-59%               stenosis. This is the outflow of the  stent. 40-75% mid stent               stenosis. ECA images not obtained. Vertebrals:  Bilateral vertebral arteries demonstrate antegrade flow. Bilateral              vertebral arteries demonstrate bidirectional flow. Subclavians: Left subclavian artery was stenotic. Normal flow hemodynamics were              seen in the right subclavian artery.  No significant change compared to the exams on 12-12-17 and 01-01-19.   Plan: Follow-up in 6 months with Carotid Duplex scan.   I discussed in depth with the patient the nature of atherosclerosis, and emphasized the importance of maximal medical management including strict control of blood pressure, blood glucose, and lipid levels, obtaining regular exercise, and continued cessation of smoking.  The patient is aware that without maximal medical management the underlying atherosclerotic disease process will progress, limiting the benefit of any interventions. The patient was given information about stroke prevention and what symptoms should prompt the patient to seek immediate medical care. Thank you for allowing [place] to participate in this patient's care.  Clemon Chambers, RN, MSN, FNP-C [facility] Office: [phone]  Clinic Physician: Laqueta Due  07/03/19 1:03 PM

## 2019-07-18 ENCOUNTER — Ambulatory Visit (INDEPENDENT_AMBULATORY_CARE_PROVIDER_SITE_OTHER): Payer: HMO | Admitting: *Deleted

## 2019-07-18 DIAGNOSIS — I469 Cardiac arrest, cause unspecified: Secondary | ICD-10-CM

## 2019-07-18 DIAGNOSIS — I480 Paroxysmal atrial fibrillation: Secondary | ICD-10-CM

## 2019-07-19 ENCOUNTER — Telehealth: Payer: Self-pay

## 2019-07-19 LAB — CUP PACEART REMOTE DEVICE CHECK
Battery Remaining Longevity: 59 mo
Battery Remaining Percentage: 75 %
Battery Voltage: 2.96 V
Brady Statistic AP VP Percent: 3.8 %
Brady Statistic AP VS Percent: 95 %
Brady Statistic AS VP Percent: 1.5 %
Brady Statistic AS VS Percent: 1 %
Brady Statistic RA Percent Paced: 98 %
Brady Statistic RV Percent Paced: 5.2 %
Date Time Interrogation Session: 20200724143328
HighPow Impedance: 68 Ohm
HighPow Impedance: 68 Ohm
Implantable Lead Implant Date: 20140217
Implantable Lead Implant Date: 20190104
Implantable Lead Location: 753859
Implantable Lead Location: 753860
Implantable Lead Model: 7122
Implantable Pulse Generator Implant Date: 20190104
Lead Channel Impedance Value: 380 Ohm
Lead Channel Impedance Value: 630 Ohm
Lead Channel Pacing Threshold Amplitude: 1 V
Lead Channel Pacing Threshold Amplitude: 1.25 V
Lead Channel Pacing Threshold Pulse Width: 0.5 ms
Lead Channel Pacing Threshold Pulse Width: 0.8 ms
Lead Channel Sensing Intrinsic Amplitude: 1 mV
Lead Channel Sensing Intrinsic Amplitude: 12 mV
Lead Channel Setting Pacing Amplitude: 2.5 V
Lead Channel Setting Pacing Amplitude: 2.5 V
Lead Channel Setting Pacing Pulse Width: 0.5 ms
Lead Channel Setting Sensing Sensitivity: 0.5 mV
Pulse Gen Serial Number: 9777027

## 2019-07-19 NOTE — Telephone Encounter (Signed)
Spoke with patient to remind of missed remote transmission 

## 2019-07-26 ENCOUNTER — Encounter: Payer: Self-pay | Admitting: Family Medicine

## 2019-07-26 ENCOUNTER — Other Ambulatory Visit: Payer: Self-pay

## 2019-07-26 ENCOUNTER — Ambulatory Visit (INDEPENDENT_AMBULATORY_CARE_PROVIDER_SITE_OTHER): Payer: HMO | Admitting: Family Medicine

## 2019-07-26 VITALS — BP 142/68 | HR 65 | Wt 180.6 lb

## 2019-07-26 DIAGNOSIS — S40811A Abrasion of right upper arm, initial encounter: Secondary | ICD-10-CM

## 2019-07-26 DIAGNOSIS — S50811A Abrasion of right forearm, initial encounter: Secondary | ICD-10-CM

## 2019-07-26 DIAGNOSIS — Z23 Encounter for immunization: Secondary | ICD-10-CM

## 2019-07-26 NOTE — Progress Notes (Signed)
Subjective:    Joe Wiggins - 73 y.o. male MRN [id number]  Date of birth: 14-Jan-1946  CC:  Joe Wiggins is here for follow-up of a right arm abrasion.  HPI: Right arm abrasion Joe Wiggins tripped over the scale in his bathroom and hit his arm on the sink on Wednesday, July 22.  He sustained an abrasion along the ulnar side of his right forearm.  His wife has wrapped the wound and use Neosporin ointment on it, but she was worried that he was developing an infection due to the erythema surrounding the healing wound.  The patient denies any drainage, pus, fevers, chills, or other constitutional symptoms.  He has some oozing of 1 of the areas of his injury but has had good control of bleeding otherwise.  He does take Eliquis 5 mg twice daily.  Health Maintenance:  Health Maintenance Due  Topic Date Due  . Hepatitis C Screening  16-Aug-1946  . COLONOSCOPY  05/06/1995  . INFLUENZA VACCINE  07/27/2019    -  reports that he quit smoking about 6 years ago. His smoking use included cigarettes. He has never used smokeless tobacco. - Review of Systems: Per HPI. - Past Medical History: Patient Active Problem List   Diagnosis Date Noted  . Abrasion of forearm, right 07/27/2019  . Facial rash 08/15/2018  . Orthostatic hypotension 03/25/2018  . Bradycardia 03/25/2018  . History of cardiac arrest 03/25/2018  . Vascular dementia without behavioral disturbance ([place]) 03/25/2018  . Seasonal allergies 03/14/2018  . Pre-syncope 01/16/2018  . ICD (implantable cardioverter-defibrillator) in place   . Physical deconditioning   . Acute respiratory failure ([place])   . Cardiac arrest ([place])   . Community acquired pneumonia of right upper lobe of lung ([place])   . HAP (hospital-acquired pneumonia) 12/25/2017  . CAD (coronary artery disease), native coronary artery 01/20/2016  . Long term current use of anticoagulant therapy 01/20/2016  . History of TIA (transient ischemic attack) and stroke 01/20/2016  . Sleep apnea    . Cardiac pacemaker in situ   . Anemia 01/17/2016  . Bilateral carotid artery stenosis   . Hyperlipidemia   . Dementia without behavioral disturbance ([place])   . Hypertensive heart disease without CHF   . Paroxysmal atrial fibrillation ([facility])   . Chronic kidney disease, stage 3 ([place])   . COPD exacerbation ([facility])    - Medications: reviewed and updated   Objective:   Physical Exam BP (!) 142/68   Pulse 65   Wt 180 lb 9.6 oz (81.9 kg)   SpO2 98%   BMI 25.91 kg/m  Gen: NAD, alert, cooperative with exam, pleasant, appears elderly CV: RRR, good S1/S2, no murmur, no edema Resp: CTABL, no wheezes, non-labored Skin: Healing superficial abrasions on right forearm as pictured below.  No signs of infection.  Some oozing noted. Neuro: no gross deficits.          Assessment & Plan:   Abrasion of forearm, right Appears to be healing well.  Wife was reassured that healing will just take a bit longer since his skin is thin, he is on Eliquis, and due to his age.  However, there are no signs of infection, and he has several signs of good healing.  Counseled his wife to continue washing the area with warm soapy water and keeping the area dry.  Told her that it does not need to be bandaged all of the time but can be bandaged when he is at high risk of knocking  a scab off, such as during sleep.  Patient and his wife expressed understanding to this plan.  Tetanus shot was provided today since he is due for this and has had a recent injury.    Maia Breslow, M.D. 07/27/2019, 9:25 PM PGY-3, [place]

## 2019-07-27 DIAGNOSIS — S50811A Abrasion of right forearm, initial encounter: Secondary | ICD-10-CM

## 2019-07-27 HISTORY — DX: Abrasion of right forearm, initial encounter: S50.811A

## 2019-07-27 NOTE — Assessment & Plan Note (Addendum)
Appears to be healing well.  Wife was reassured that healing will just take a bit longer since his skin is thin, he is on Eliquis, and due to his age.  However, there are no signs of infection, and he has several signs of good healing.  Counseled his wife to continue washing the area with warm soapy water and keeping the area dry.  Told her that it does not need to be bandaged all of the time but can be bandaged when he is at high risk of knocking a scab off, such as during sleep.  Patient and his wife expressed understanding to this plan.  Tetanus shot was provided today since he is due for this and has had a recent injury.

## 2019-07-30 NOTE — Progress Notes (Signed)
Remote ICD transmission.   

## 2019-08-06 ENCOUNTER — Other Ambulatory Visit: Payer: Self-pay | Admitting: Pharmacist

## 2019-08-06 MED ORDER — APIXABAN 5 MG PO TABS: 5.0000 mg | ORAL_TABLET | Freq: Two times a day (BID) | ORAL | 1 refills | Status: DC

## 2019-08-06 NOTE — Progress Notes (Signed)
Age 73, weight 82kg, SCr 1.96 on 10/15/18, last OV Jan 2020 afib indication

## 2019-08-14 ENCOUNTER — Ambulatory Visit: Payer: HMO | Admitting: Pharmacist

## 2019-08-29 DIAGNOSIS — I480 Paroxysmal atrial fibrillation: Secondary | ICD-10-CM | POA: Diagnosis not present

## 2019-08-29 DIAGNOSIS — Z9989 Dependence on other enabling machines and devices: Secondary | ICD-10-CM | POA: Diagnosis not present

## 2019-08-29 DIAGNOSIS — J440 Chronic obstructive pulmonary disease with acute lower respiratory infection: Secondary | ICD-10-CM | POA: Diagnosis not present

## 2019-08-29 DIAGNOSIS — G4733 Obstructive sleep apnea (adult) (pediatric): Secondary | ICD-10-CM | POA: Diagnosis not present

## 2019-08-30 DIAGNOSIS — G4733 Obstructive sleep apnea (adult) (pediatric): Secondary | ICD-10-CM | POA: Diagnosis not present

## 2019-09-17 ENCOUNTER — Ambulatory Visit: Payer: HMO | Admitting: Pharmacist

## 2019-09-17 ENCOUNTER — Other Ambulatory Visit: Payer: Self-pay | Admitting: Pharmacist

## 2019-09-17 ENCOUNTER — Ambulatory Visit: Payer: Self-pay | Admitting: Pharmacist

## 2019-09-17 NOTE — Patient Outreach (Signed)
[facility]) Care Management  09/17/2019  Joe Wiggins 02/12/1946 [id number]   Patient was called regarding [name] Follow Up. Unfortunately, he did not answer the phone. HIPAA compliant message was left on his voicemail.  Plan: Send patient an unsuccessful outreach letter. Call patient back in 6-8 weeks.   Elayne Guerin, PharmD, [place] Clinical Pharmacist [phone]

## 2019-10-14 ENCOUNTER — Other Ambulatory Visit: Payer: Self-pay

## 2019-10-14 ENCOUNTER — Ambulatory Visit (INDEPENDENT_AMBULATORY_CARE_PROVIDER_SITE_OTHER): Payer: HMO | Admitting: Neurology

## 2019-10-14 ENCOUNTER — Encounter: Payer: Self-pay | Admitting: Neurology

## 2019-10-14 VITALS — BP 172/76 | HR 67 | Temp 97.8°F | Wt 183.0 lb

## 2019-10-14 DIAGNOSIS — F0390 Unspecified dementia without behavioral disturbance: Secondary | ICD-10-CM

## 2019-10-14 NOTE — Patient Instructions (Signed)
I had a long d/w patient and his wife about his remote stroke,dementia, risk for recurrent stroke/TIAs, personally independently reviewed imaging studies and stroke evaluation results and answered questions.Continue Eliquis (apixaban) daily for secondary stroke prevention for his atrial fibrillation history  and maintain strict control of hypertension with blood pressure goal below 130/90, diabetes with hemoglobin A1c goal below 6.5% and lipids with LDL cholesterol goal below 70 mg/dL. He apparently has not been able to tolerate Aricept or Namenda.  Continue participation cognitively challenging activities like solving crossword puzzles, playing bridge and sodoku.  We also discussed memory compensation strategies. . Consider possible participation in the [place] 2 dementia study if interested.  He will be given information to review at home and decide.  He will followup in the future with me in one year or call earlier if necessary. Memory Compensation Strategies  1. Use "WARM" strategy.  W= write it down  A= associate it  R= repeat it  M= make a mental note  2.   You can keep a Social worker.  Use a 3-ring notebook with sections for the following: calendar, important names and phone numbers,  medications, doctors' names/phone numbers, lists/reminders, and a section to journal what you did  each day.   3.    Use a calendar to write appointments down.  4.    Write yourself a schedule for the day.  This can be placed on the calendar or in a separate section of the Memory Notebook.  Keeping a  regular schedule can help memory.  5.    Use medication organizer with sections for each day or morning/evening pills.  You may need help loading it  6.    Keep a basket, or pegboard by the door.  Place items that you need to take out with you in the basket or on the pegboard.  You may also want to  include a message board for reminders.  7.    Use sticky notes.  Place sticky notes with reminders  in a place where the task is performed.  For example: " turn off the  stove" placed by the stove, "lock the door" placed on the door at eye level, " take your medications" on  the bathroom mirror or by the place where you normally take your medications.  8.    Use alarms/timers.  Use while cooking to remind yourself to check on food or as a reminder to take your medicine, or as a  reminder to make a call, or as a reminder to perform another task, etc.

## 2019-10-14 NOTE — Progress Notes (Signed)
[facility]  PATIENT: Joe Wiggins DOB: 04-Feb-1946   REASON FOR VISIT: Follow-up for stroke/TIA  January 2017, dementia  HISTORY FROM: Patient and wife    HISTORY OF PRESENT ILLNESS: UPDATE 02/01/17 [name], 73 year old male returns for follow-up. He had hospital admission for TIA in January 2017. He has past medical history of paroxysmal atrial fibrillation on eliquis hypertension hyperlipidemia coronary artery disease status post CABG, pacemaker,  carotid artery disease, COPD  and dementia. He is currently on Aricept  23 mg daily  He is on Namenda 7 mg and has had trouble titrating this in the past. He denies further stroke or TIA symptoms since hospital discharge. He denies any headache double vision difficulty swallowing, slurred speech. He also has a history of obstructive sleep apnea and uses CPAP at night. He remains on eliquis for atrial fib and  secondary stroke prevention. He has bruising on his hands but no signs of bleeding. He denies any falls and he continues to walk at the mall.Patient is living at home with his wife.  He can dress himself and can shower though under observation. Is not driving or cooking. Appetite is good and he is sleeping well without hallucinations or wandering behavior. No safety issues identified. Last carotid Doppler done in this office 07/14/2016 shows complete occlusion of the right ICA and suspected left VA and abnormally elevated BNP was seen in the right and left ECA consistent with mild stenosis of the vessels. The left ICA stent is elevated BNP was still less than 50% diameter reduction mixed mild severe plaques with acoustic shadowing were noted bilaterally. He continues to follow up with Dr. Donnetta Hutching [facility].  Wife  reports he sometimes agitated and she was asked to discuss this with his primary care physician for management .He returns for reevaluation  Update 10/02/2017 : Patient returns for follow-up after last visit 6 months ago. He is  accompanied  by his wife. The patient discontinued Aricept since it caused diarrhea and had trouble tolerating it. He has not noticed any cognitive worsening in fact on Mini-Mental testing today scored 23/30 which is improvement from 19/30 at last visit. Patient continues on Namenda 14 mg daily and has not been able to tolerate a higher dose in the past. His short-term memory continues to be poor but he needs only my supervision and is mostly independent with most activities of daily living. He is not had any recurrent TIA or stroke symptoms. His remains on eliquis which is tolerating well with minor bruising but no bleeding episodes. He states his blood pressure is well controlled and today it is 130/60. He has no new complaints. He denies any agitation, delusions, hallucinations or unsafe behavior Update 10/04/2018 ; he returns for follow-up after last visit a year ago.  He is accompanied by his wife.  The patient states he discontinued Namenda about a year ago since it made him jittery and nervous.  He has not noticed any cognitive worsening and I feels that he is more or less the same.  He continues to have short-term memory difficulties and trouble remembering names and recent information.  He is still able to handle most of it effectively daily living.  He lives at home with his wife.  He has not had any.  Of agitation, delusions or hallucinations or unsafe behavior.  He ambulates well and has had no falls or injuries.  He has had no recurrent stroke or TIA symptoms.  He is tolerating Eliquis well without  bleeding but does get easy bruising.  His blood pressure usually is well controlled at home but today it is elevated in office at 156/80 .  He has not had any recent lipid profile checked but plans to have annual physical by his primary physician soon. Update 10/14/2019 : He returns for follow-up after last visit a year ago.  He states he is doing well.  He still has memory and cognitive difficulties which  he feels more or less unchanged.  He has trouble remembering recent information and names.  He still able to handle most of his activities of daily living by himself.  Is living at home with his wife.  He denies any hallucinations, delusions, agitation or unsafe behavior.  He is ambulating independently with good balance and no falls or injuries.  He does get bruising as he is on Eliquis.  States his blood pressures well controlled at home though today it is elevated in office at 172/76.  Remains on Lipitor which is tolerating well without muscle aches and pains.  He has no new complaints today. REVIEW OF SYSTEMS: Full 14 system review of systems performed and notable only for those listed, all others are neg:  Hearing loss,  Memory loss, apnea, easy bruising and bleeding, anemia, agitation, confusion and all other systems negative  ALLERGIES: Allergies  Allergen Reactions  . Ipratropium Other (See Comments)  . Morphine And Related Nausea Only  . Vicodin [Hydrocodone-Acetaminophen] Other (See Comments)    Hallucinations   . Penicillins Rash    HOME MEDICATIONS: Outpatient Medications Prior to Visit  Medication Sig Dispense Refill  . albuterol (ACCUNEB) 0.63 MG/3ML nebulizer solution 3 ml inhale orally via nebulizer four times a day    . albuterol (PROVENTIL HFA;VENTOLIN HFA) 108 (90 Base) MCG/ACT inhaler Inhale 2 puffs into the lungs every 6 (six) hours as needed.     Marland Kitchen amiodarone (PACERONE) 200 MG tablet Take one tablet by mouth daily Monday through Saturday.  DO NOT take on Sunday. 90 tablet 2  . apixaban (ELIQUIS) 5 MG TABS tablet Take 1 tablet (5 mg total) by mouth 2 (two) times daily. 180 tablet 1  . atorvastatin (LIPITOR) 40 MG tablet Take 1 tablet (40 mg total) by mouth daily. 90 tablet 3  . cholecalciferol (VITAMIN D) 1000 units tablet Take 1,000 Units by mouth daily.    . Cyanocobalamin (VITAMIN B 12 PO) Take by mouth daily.    . famotidine (PEPCID) 20 MG tablet Take 20 mg by mouth  daily.    . ferrous sulfate 325 (65 FE) MG tablet Take 325 mg by mouth daily with breakfast.    . Fluticasone-Salmeterol (ADVAIR) 250-50 MCG/DOSE AEPB Inhale 1 puff into the lungs 2 (two) times daily.    . folic acid (FOLVITE) A999333 MCG tablet Take 400 mcg by mouth daily.    . furosemide (LASIX) 40 MG tablet Take 40 mg by mouth 2 (two) times daily.    . hydrALAZINE (APRESOLINE) 25 MG tablet Take 1 tablet (25 mg total) by mouth 2 (two) times daily. 180 tablet 3  . labetalol (NORMODYNE) 300 MG tablet Take 1 tablet by mouth 2 (two) times a day.    . Multiple Vitamin (MULTIVITAMIN) tablet Take 1 tablet by mouth daily.    . nitroGLYCERIN (NITROSTAT) 0.4 MG SL tablet Place 0.4 mg under the tongue every 5 (five) minutes as needed for chest pain.    Marland Kitchen tiotropium (SPIRIVA HANDIHALER) 18 MCG inhalation capsule Place 18 mcg into inhaler and  inhale daily.    . diphenhydrAMINE (BENADRYL) 2 % cream Apply topically 3 (three) times daily as needed for itching. (Patient not taking: Reported on 10/14/2019) 15 g 0   No facility-administered medications prior to visit.     PAST MEDICAL HISTORY: Past Medical History:  Diagnosis Date  . AF (paroxysmal atrial fibrillation) ([place])   . AICD (automatic cardioverter/defibrillator) present   . CAD (coronary artery disease)   . Cardiomyopathy, ischemic   . Carotid artery occlusion   . Chronic kidney disease   . COPD (chronic obstructive pulmonary disease) ([place])   . Dementia without behavioral disturbance ([place])   . GERD (gastroesophageal reflux disease)   . Hyperlipidemia   . Hypertension   . Pneumonia   . Sleep apnea   . Stroke ([place])   . TIA (transient ischemic attack)     PAST SURGICAL HISTORY: Past Surgical History:  Procedure Laterality Date  . CARDIAC PACEMAKER PLACEMENT    . CATARACT EXTRACTION    . CORONARY ARTERY BYPASS GRAFT    . CORONARY STENT PLACEMENT    . EYE SURGERY    . ICD IMPLANT N/A 12/29/2017   Procedure: ICD IMPLANT;  Surgeon: Evans Lance, MD;  Location: [facility];  Service: Cardiovascular;  Laterality: N/A;  . LEFT HEART CATH AND CORS/GRAFTS ANGIOGRAPHY N/A 12/29/2017   Procedure: LEFT HEART CATH AND CORS/GRAFTS ANGIOGRAPHY;  Surgeon: Martinique, Peter M, MD;  Location: [facility];  Service: Cardiovascular;  Laterality: N/A;    FAMILY HISTORY: Family History  Problem Relation Age of Onset  . Hypertension Mother   . Heart disease Mother   . Hypertension Sister   . Heart disease Sister   . Thyroid disease Sister   . Thyroid disease Brother        hyperthyroid    SOCIAL HISTORY: Social History   Socioeconomic History  . Marital status: Married    Spouse name: Not on file  . Number of children: Not on file  . Years of education: Not on file  . Highest education level: Not on file  Occupational History  . Not on file  Social Needs  . Financial resource strain: Not on file  . Food insecurity    Worry: Not on file    Inability: Not on file  . Transportation needs    Medical: Not on file    Non-medical: Not on file  Tobacco Use  . Smoking status: Former Smoker    Types: Cigarettes    Quit date: 11/30/2012    Years since quitting: 6.8  . Smokeless tobacco: Never Used  Substance and Sexual Activity  . Alcohol use: No  . Drug use: No  . Sexual activity: Not on file  Lifestyle  . Physical activity    Days per week: Not on file    Minutes per session: Not on file  . Stress: Not on file  Relationships  . Social Herbalist on phone: Not on file    Gets together: Not on file    Attends religious service: Not on file    Active member of club or organization: Not on file    Attends meetings of clubs or organizations: Not on file    Relationship status: Not on file  . Intimate partner violence    Fear of current or ex partner: Not on file    Emotionally abused: Not on file    Physically abused: Not on file    Forced  sexual activity: Not on file  Other Topics Concern  . Not on  file  Social History Narrative  . Not on file     PHYSICAL EXAM  Vitals:   10/14/19 1354  BP: (!) 172/76  Pulse: 67  Temp: 97.8 F (36.6 C)  Weight: 83 kg   Body mass index is 26.26 kg/m.   Generalized: Well developed elderly Caucasian male, in no acute distress , well-groomed Head: normocephalic and atraumatic,.  Neck: Supple, no carotid bruits  Cardiac: Regular rate rhythm, no murmur  Musculoskeletal: No deformity  Skin bruising of the hands Neurological examination   Mentation: Alert MMSE not done AFT 15. Clock drawing 4/4. Recall 0/3 MMSE - Mini Mental State Exam 10/02/2017 02/01/2017 06/29/2016  Orientation to time 5 4 1   Orientation to Place 5 3 3   Registration 3 3 3   Attention/ Calculation 2 1 3   Recall 0 0 0  Language- name 2 objects 2 2 2   Language- repeat 1 1 1   Language- follow 3 step command 3 3 3   Language- read & follow direction 1 1 1   Write a sentence 1 1 1   Copy design 0 0 1  Total score 23 19 19   .  Follows all commands speech and language fluent.   Cranial nerve II-XII: .Pupils were equal round reactive to light extraocular movements were full, visual field were full on confrontational test. Facial sensation and strength were normal. hearing was intact to finger rubbing bilaterally. Uvula tongue midline. head turning and shoulder shrug were normal and symmetric.Tongue protrusion into cheek strength was normal. Motor: normal bulk and tone, full strength in the BUE, BLE, fine finger movements normal, no pronator drift. No focal weakness Sensory: normal and symmetric to light touch, pinprick, and  Vibration, In the upper and lower extremities Coordination: finger-nose-finger, heel-to-shin bilaterally, no dysmetria Reflexes: Symmetric upper and lower plantar responses were flexor bilaterally. Gait and Station: Rising up from seated position without assistance, normal stance,  moderate stride, good arm swing, smooth turning, able to perform tiptoe, and heel  walking without difficulty. Tandem gait is mildly unsteady. No assistive device  DIAGNOSTIC DATA (LABS, IMAGING, TESTING) - I reviewed patient records, labs, notes, testing and imaging myself where available.   Reviewed recent CMP and lipid profile from 08/18/2016 from care everywhere    ASSESSMENT AND PLAN 2 year Caucasian male with right brain TIA and subacute right frontal subcortical infarct in Jan 2017 secondary to atrial fibrillation with  moderate baseline dementia. Multiple vascular risk factors of atrial fibrillation, hypertension, hyperlipidemia, coronary artery disease, ischemic cardiomyopathy, bilateral extracranial carotid disease and COPD and OSA... Mild dementia which appears to be stable.  PLAN: I had a long d/w patient and his wife about his remote stroke,dementia, risk for recurrent stroke/TIAs, personally independently reviewed imaging studies and stroke evaluation results and answered questions.Continue Eliquis (apixaban) daily for secondary stroke prevention for his atrial fibrillation history  and maintain strict control of hypertension with blood pressure goal below 130/90, diabetes with hemoglobin A1c goal below 6.5% and lipids with LDL cholesterol goal below 70 mg/dL. He apparently has not been able to tolerate Aricept or Namenda.  Continue participation cognitively challenging activities like solving crossword puzzles, playing bridge and sodoku.  We also discussed memory compensation strategies. . Consider possible participation in the [place] 2 dementia study if interested.  He will be given information to review at home and decide.  He will followup in the future with me in one year or call earlier  if necessary.necessary.I spent 63min  in total face to face time with the patient more than 50% of which was spent counseling and coordination of care about his dementia and remote stroke, reviewing test results reviewing medications and discussing and reviewing the diagnosis  of stroke prevention, atherosclerosis and importance of control of blood pressure blood glucose and lipid levels and continued cessation of smoking    Antony Contras, MD [facility] [address], [place], [place] [address] [phone]

## 2019-10-18 ENCOUNTER — Ambulatory Visit (INDEPENDENT_AMBULATORY_CARE_PROVIDER_SITE_OTHER): Payer: HMO | Admitting: *Deleted

## 2019-10-18 DIAGNOSIS — I119 Hypertensive heart disease without heart failure: Secondary | ICD-10-CM

## 2019-10-18 DIAGNOSIS — I469 Cardiac arrest, cause unspecified: Secondary | ICD-10-CM

## 2019-10-20 LAB — CUP PACEART REMOTE DEVICE CHECK
Battery Remaining Longevity: 56 mo
Battery Remaining Percentage: 73 %
Battery Voltage: 2.95 V
Brady Statistic AP VP Percent: 3.3 %
Brady Statistic AP VS Percent: 95 %
Brady Statistic AS VP Percent: 1.7 %
Brady Statistic AS VS Percent: 1 %
Brady Statistic RA Percent Paced: 98 %
Brady Statistic RV Percent Paced: 5.1 %
Date Time Interrogation Session: 20201023121533
HighPow Impedance: 74 Ohm
HighPow Impedance: 74 Ohm
Implantable Lead Implant Date: 20140217
Implantable Lead Implant Date: 20190104
Implantable Lead Location: 753859
Implantable Lead Location: 753860
Implantable Lead Model: 7122
Implantable Pulse Generator Implant Date: 20190104
Lead Channel Impedance Value: 360 Ohm
Lead Channel Impedance Value: 600 Ohm
Lead Channel Pacing Threshold Amplitude: 1 V
Lead Channel Pacing Threshold Amplitude: 1.25 V
Lead Channel Pacing Threshold Pulse Width: 0.5 ms
Lead Channel Pacing Threshold Pulse Width: 0.8 ms
Lead Channel Sensing Intrinsic Amplitude: 0.8 mV
Lead Channel Sensing Intrinsic Amplitude: 12 mV
Lead Channel Setting Pacing Amplitude: 2.5 V
Lead Channel Setting Pacing Amplitude: 2.5 V
Lead Channel Setting Pacing Pulse Width: 0.5 ms
Lead Channel Setting Sensing Sensitivity: 0.5 mV
Pulse Gen Serial Number: 9777027

## 2019-10-22 DIAGNOSIS — N184 Chronic kidney disease, stage 4 (severe): Secondary | ICD-10-CM | POA: Diagnosis not present

## 2019-10-25 NOTE — Progress Notes (Signed)
Remote ICD transmission.   

## 2019-10-29 NOTE — Progress Notes (Signed)
   Subjective:    Patient ID: Joe Wiggins, male    DOB: 16-Dec-1946, 73 y.o.   MRN: [id number]   CC: fluctuating blood pressure  HPI: Hypertension: - Medications: amiodarone 200mg , lasix 40mg  bid, hydral 25mg  bid, labetalol 300mg  bid,  - Compliance: good - Checking BP at home: yes - Denies any SOB, CP, vision changes, LE edema, medication SEs, or symptoms of hypotension - Diet: no changes  - Exercise: used to walk daily but stopped due to COVID - Patient notes recent fluctuating of blood pressures.  States that has been increasing when he stands up.  At that time he does get dizzy.  Blood pressure goes back down when he sits down.  Blood pressure when standing can be as elevated as [id number] systolic.  This usually last for 15 minutes.  Once patient sits down blood pressure normalizes.  States that yesterday he had a good day and had no elevated blood pressure.  Only changes include exercising less as to coronavirus he is staying home more.  Diet has not changed.  Is using CPAP nightly without difficulty.  Denies any chest pain, shortness of breath, edema, slurred speech, facial droop, headaches, blurred vision.  No recent medication changes or over-the-counter products used.   Objective:  BP (!) 145/75   Pulse 70   Wt 180 lb 6.4 oz (81.8 kg)   SpO2 95%   BMI 25.88 kg/m  Vitals and nursing note reviewed  General: well nourished, in no acute distress HEENT: normocephalic, PERRL, no AV nicking, no scleral icterus or conjunctival pallor, no nasal discharge, moist mucous membranes, good dentition without erythema or discharge noted in posterior oropharynx Neck: supple, non-tender, without lymphadenopathy Cardiac: RRR, clear S1 and S2, no murmurs, rubs, or gallops Respiratory: clear to auscultation bilaterally, no increased work of breathing Abdomen: soft, nontender, nondistended, no masses or organomegaly. Bowel sounds present Extremities: no edema or cyanosis. Warm, well perfused. 2+ radial  pulses bilaterally Skin: warm and dry, no rashes noted Neuro: alert and oriented, no focal deficits   Assessment & Plan:    Hypertension Patient with fluctuating blood pressures.  Normotensive in office today.  Was also normotensive at home yesterday.  Patient notes that his blood pressure only elevates when he is standing.  Can get is elevated 200s over 100s.  I have called patient's cardiologist as his blood pressure seems to be primarily managed by Dr. Lovena Le.  Does not appear to get medications from [facility] for hypertension management.  Although Dr. Lovena Le is not available today his nurse practitioner is.  Was able to schedule appointment for 10/30/19 @3 :15PM.  Patient made aware of this appointment and instructed to follow-up with them.  Strict return precautions given.  Follow-up in emergency room if chest pain, shortness of breath, slurred speech, facial droop.    Return if symptoms worsen or fail to improve.   Caroline More, DO, PGY-3

## 2019-10-30 ENCOUNTER — Ambulatory Visit (INDEPENDENT_AMBULATORY_CARE_PROVIDER_SITE_OTHER): Payer: HMO | Admitting: Physician Assistant

## 2019-10-30 ENCOUNTER — Other Ambulatory Visit: Payer: Self-pay

## 2019-10-30 ENCOUNTER — Telehealth: Payer: Self-pay | Admitting: *Deleted

## 2019-10-30 ENCOUNTER — Encounter: Payer: Self-pay | Admitting: Physician Assistant

## 2019-10-30 ENCOUNTER — Ambulatory Visit (INDEPENDENT_AMBULATORY_CARE_PROVIDER_SITE_OTHER): Payer: HMO | Admitting: Family Medicine

## 2019-10-30 VITALS — BP 167/84 | HR 67 | Ht 70.0 in | Wt 178.0 lb

## 2019-10-30 DIAGNOSIS — Z95810 Presence of automatic (implantable) cardiac defibrillator: Secondary | ICD-10-CM | POA: Diagnosis not present

## 2019-10-30 DIAGNOSIS — I10 Essential (primary) hypertension: Secondary | ICD-10-CM

## 2019-10-30 DIAGNOSIS — I480 Paroxysmal atrial fibrillation: Secondary | ICD-10-CM

## 2019-10-30 DIAGNOSIS — D649 Anemia, unspecified: Secondary | ICD-10-CM | POA: Diagnosis not present

## 2019-10-30 DIAGNOSIS — I169 Hypertensive crisis, unspecified: Secondary | ICD-10-CM | POA: Insufficient documentation

## 2019-10-30 DIAGNOSIS — I129 Hypertensive chronic kidney disease with stage 1 through stage 4 chronic kidney disease, or unspecified chronic kidney disease: Secondary | ICD-10-CM | POA: Diagnosis not present

## 2019-10-30 DIAGNOSIS — I255 Ischemic cardiomyopathy: Secondary | ICD-10-CM

## 2019-10-30 DIAGNOSIS — R001 Bradycardia, unspecified: Secondary | ICD-10-CM

## 2019-10-30 DIAGNOSIS — N184 Chronic kidney disease, stage 4 (severe): Secondary | ICD-10-CM | POA: Diagnosis not present

## 2019-10-30 DIAGNOSIS — I2510 Atherosclerotic heart disease of native coronary artery without angina pectoris: Secondary | ICD-10-CM | POA: Diagnosis not present

## 2019-10-30 DIAGNOSIS — I469 Cardiac arrest, cause unspecified: Secondary | ICD-10-CM

## 2019-10-30 NOTE — Telephone Encounter (Signed)
Call was transferred by scheduler. Darrick Penna, CMA from Dr. Marilynne Drivers office (PCP). Pt went there today for fluctuating BPs.   Dr. Tammi Klippel came into conversation and reports Dr. Lovena Le has been managing pt's bp/meds and so she recommends an appointment at our office.  Dr. Tammi Klippel did not wish to speak with physician. At the office today 145/75 but readings at home 200s/100s. Adv to have patient bring cuff and list of any readings with him.

## 2019-10-30 NOTE — Patient Instructions (Addendum)
Hypertension, Adult Hypertension is another name for high blood pressure. High blood pressure forces your heart to work harder to pump blood. This can cause problems over time. There are two numbers in a blood pressure reading. There is a top number (systolic) over a bottom number (diastolic). It is best to have a blood pressure that is below 120/80. Healthy choices can help lower your blood pressure, or you may need medicine to help lower it. What are the causes? The cause of this condition is not known. Some conditions may be related to high blood pressure. What increases the risk?  Smoking.  Having type 2 diabetes mellitus, high cholesterol, or both.  Not getting enough exercise or physical activity.  Being overweight.  Having too much fat, sugar, calories, or salt (sodium) in your diet.  Drinking too much alcohol.  Having long-term (chronic) kidney disease.  Having a family history of high blood pressure.  Age. Risk increases with age.  Race. You may be at higher risk if you are African American.  Gender. Men are at higher risk than women before age 45. After age 65, women are at higher risk than men.  Having obstructive sleep apnea.  Stress. What are the signs or symptoms?  High blood pressure may not cause symptoms. Very high blood pressure (hypertensive crisis) may cause: ? Headache. ? Feelings of worry or nervousness (anxiety). ? Shortness of breath. ? Nosebleed. ? A feeling of being sick to your stomach (nausea). ? Throwing up (vomiting). ? Changes in how you see. ? Very bad chest pain. ? Seizures. How is this treated?  This condition is treated by making healthy lifestyle changes, such as: ? Eating healthy foods. ? Exercising more. ? Drinking less alcohol.  Your health care provider may prescribe medicine if lifestyle changes are not enough to get your blood pressure under control, and if: ? Your top number is above 130. ? Your bottom number is above 80.   Your personal target blood pressure may vary. Follow these instructions at home: Eating and drinking   If told, follow the DASH eating plan. To follow this plan: ? Fill one half of your plate at each meal with fruits and vegetables. ? Fill one fourth of your plate at each meal with whole grains. Whole grains include whole-wheat pasta, brown rice, and whole-grain bread. ? Eat or drink low-fat dairy products, such as skim milk or low-fat yogurt. ? Fill one fourth of your plate at each meal with low-fat (lean) proteins. Low-fat proteins include fish, chicken without skin, eggs, beans, and tofu. ? Avoid fatty meat, cured and processed meat, or chicken with skin. ? Avoid pre-made or processed food.  Eat less than 1,500 mg of salt each day.  Do not drink alcohol if: ? Your doctor tells you not to drink. ? You are pregnant, may be pregnant, or are planning to become pregnant.  If you drink alcohol: ? Limit how much you use to:  0-1 drink a day for women.  0-2 drinks a day for men. ? Be aware of how much alcohol is in your drink. In the [place] one drink equals one 12 oz bottle of beer (355 mL), one 5 oz glass of wine (148 mL), or one 1 oz glass of hard liquor (44 mL). Lifestyle   Work with your doctor to stay at a healthy weight or to lose weight. Ask your doctor what the best weight is for you.  Get at least 30 minutes of exercise most   days of the week. This may include walking, swimming, or biking.  Get at least 30 minutes of exercise that strengthens your muscles (resistance exercise) at least 3 days a week. This may include lifting weights or doing Pilates.  Do not use any products that contain nicotine or tobacco, such as cigarettes, e-cigarettes, and chewing tobacco. If you need help quitting, ask your doctor.  Check your blood pressure at home as told by your doctor.  Keep all follow-up visits as told by your doctor. This is important. Medicines  Take over-the-counter and  prescription medicines only as told by your doctor. Follow directions carefully.  Do not skip doses of blood pressure medicine. The medicine does not work as well if you skip doses. Skipping doses also puts you at risk for problems.  Ask your doctor about side effects or reactions to medicines that you should watch for. Contact a doctor if you:  Think you are having a reaction to the medicine you are taking.  Have headaches that keep coming back (recurring).  Feel dizzy.  Have swelling in your ankles.  Have trouble with your vision. Get help right away if you:  Get a very bad headache.  Start to feel mixed up (confused).  Feel weak or numb.  Feel faint.  Have very bad pain in your: ? Chest. ? Belly (abdomen).  Throw up more than once.  Have trouble breathing. Summary  Hypertension is another name for high blood pressure.  High blood pressure forces your heart to work harder to pump blood.  For most people, a normal blood pressure is less than 120/80.  Making healthy choices can help lower blood pressure. If your blood pressure does not get lower with healthy choices, you may need to take medicine. This information is not intended to replace advice given to you by your health care provider. Make sure you discuss any questions you have with your health care provider. Document Released: 05/30/2008 Document Revised: 08/22/2018 Document Reviewed: 08/22/2018 Elsevier Patient Education  [employer].  I was able to schedule you an appointment with Dr. Tanna Furry office on 10/30/19 @3 :15PM. Please bring your blood pressure cuff with you and any list of readings you may have at home. You must come alone for this appointment.   Please go to the emergency room if you have vision changes, chest pain, shortness of breath, slurred speech, facial droop, weakness, or passing out spells.   Dr. Tammi Klippel

## 2019-10-30 NOTE — Assessment & Plan Note (Addendum)
Patient with fluctuating blood pressures.  Normotensive in office today.  Was also normotensive at home yesterday.  Patient notes that his blood pressure only elevates when he is standing.  Can get is elevated 200s over 100s.  I have called patient's cardiologist as his blood pressure seems to be primarily managed by Dr. Lovena Le.  Does not appear to get medications from [facility] for hypertension management.  Although Dr. Lovena Le is not available today his nurse practitioner is.  Was able to schedule appointment for 10/30/19 @3 :15PM.  Patient made aware of this appointment and instructed to follow-up with them.  Strict return precautions given.  Follow-up in emergency room if chest pain, shortness of breath, slurred speech, facial droop.

## 2019-10-30 NOTE — Patient Instructions (Signed)
Medication Instructions:   Your physician recommends that you continue on your current medications as directed. Please refer to the Current Medication list given to you today.  *If you need a refill on your cardiac medications before your next appointment, please call your pharmacy*  Lab Work: [place]   If you have labs (blood work) drawn today and your tests are completely normal, you will receive your results only by: Marland Kitchen MyChart Message (if you have MyChart) OR . A paper copy in the mail If you have any lab test that is abnormal or we need to change your treatment, we will call you to review the results.  Testing/Procedures: NONE ORDERED  TODAY    Follow-Up:  At [facility], you and your health needs are our priority.  As part of our continuing mission to provide you with exceptional heart care, we have created designated Provider Care Teams.  These Care Teams include your primary Cardiologist (physician) and Advanced Practice Providers (APPs -  Physician Assistants and Nurse Practitioners) who all work together to provide you with the care you need, when you need it.  Your next appointment:   With BLOOD PRESSURE CLINIC IN 3 WEEKS   DR Lovena Le January  (IN RECALL )  Other Instructions

## 2019-10-30 NOTE — Progress Notes (Signed)
Cardiology Office Note Date:  10/30/2019  Patient ID:  Joe Wiggins, Joe Wiggins 28-Feb-1946, MRN [id number] PCP:  Kathrene Alu, MD  Cardiologist:  Dr. Wynonia Lawman >> Dr. Lovena Le Nephrologist: Dr. Joelyn Oms    Chief Complaint:  fluctuating BP, high particularly  History of Present Illness: Joe Wiggins is a 73 y.o. male with history of ICM, CAD s/p CABG, paroxysmal atrial fibrillation, CKD, COPD, HTN, OSA, dementia and prior TIA, sinus node dysfunction PPM, CKD (III) Jan 2019 hospitalized with a pneumonia, during this stay he suffered a VF arrest, underwent cath >> patent grafts, LVEF 40-45%, no interventions, his PPM upgraded to ICD.  He comes in today to be seen for Dr. Lovena Le, last saw him Jan this year.  He was doing well, with some reports of dizziness only.  No changes were made to his medicines or device programming. He was referred to [place] today as an add on visit 2/2 labile BP.  He went to his PMD office though they referred him here.  By the note the pt reported high BP with standing associated with dizziness, and better when seated.   He comes in today accompanied by his wife, the patient quite hard of hearing.  His wife had them see his PMD tyoday with concerns of high BP readings at home. They have noted DP BP especially always 80'-90's, the SBP aften 150's, though when standing up has been as high as 200, and he feels a little dizzy, when he sits gets lower again.  He denies any symptoms.  For years has never felt like he has much energy, tired all of the time./  He denies any CP, palpitations or SOB, no near syncope or syncope.  No shocks   He bruises easily on his hands especially, will bleed with any "nick", but no bleeding or signs of bleeding otherwise    Device information SJM dual chamber ICD, (PPM upgraded) 12/29/17, RA lead is from 2014 Secondary prevention   Past Medical History:  Diagnosis Date  . AF (paroxysmal atrial fibrillation) ([place])   . AICD (automatic  cardioverter/defibrillator) present   . CAD (coronary artery disease)   . Cardiomyopathy, ischemic   . Carotid artery occlusion   . Chronic kidney disease   . COPD (chronic obstructive pulmonary disease) ([place])   . Dementia without behavioral disturbance ([place])   . GERD (gastroesophageal reflux disease)   . Hyperlipidemia   . Hypertension   . Pneumonia   . Sleep apnea   . Stroke ([place])   . TIA (transient ischemic attack)     Past Surgical History:  Procedure Laterality Date  . CARDIAC PACEMAKER PLACEMENT    . CATARACT EXTRACTION    . CORONARY ARTERY BYPASS GRAFT    . CORONARY STENT PLACEMENT    . EYE SURGERY    . ICD IMPLANT N/A 12/29/2017   Procedure: ICD IMPLANT;  Surgeon: Evans Lance, MD;  Location: [facility];  Service: Cardiovascular;  Laterality: N/A;  . LEFT HEART CATH AND CORS/GRAFTS ANGIOGRAPHY N/A 12/29/2017   Procedure: LEFT HEART CATH AND CORS/GRAFTS ANGIOGRAPHY;  Surgeon: Martinique, Peter M, MD;  Location: [facility];  Service: Cardiovascular;  Laterality: N/A;    Current Outpatient Medications  Medication Sig Dispense Refill  . albuterol (ACCUNEB) 0.63 MG/3ML nebulizer solution 3 ml inhale orally via nebulizer four times a day    . albuterol (PROVENTIL HFA;VENTOLIN HFA) 108 (90 Base) MCG/ACT inhaler Inhale 2 puffs into the lungs every 6 (six) hours as  needed.     Marland Kitchen amiodarone (PACERONE) 200 MG tablet Take one tablet by mouth daily Monday through Saturday.  DO NOT take on Sunday. 90 tablet 2  . apixaban (ELIQUIS) 5 MG TABS tablet Take 1 tablet (5 mg total) by mouth 2 (two) times daily. 180 tablet 1  . atorvastatin (LIPITOR) 40 MG tablet Take 1 tablet (40 mg total) by mouth daily. 90 tablet 3  . cholecalciferol (VITAMIN D) 1000 units tablet Take 1,000 Units by mouth daily.    . Cyanocobalamin (VITAMIN B 12 PO) Take by mouth daily.    . famotidine (PEPCID) 20 MG tablet Take 20 mg by mouth daily.    . ferrous sulfate 325 (65 FE) MG tablet Take 325 mg by  mouth daily with breakfast.    . Fluticasone-Salmeterol (ADVAIR) 250-50 MCG/DOSE AEPB Inhale 1 puff into the lungs 2 (two) times daily.    . folic acid (FOLVITE) A999333 MCG tablet Take 400 mcg by mouth daily.    . furosemide (LASIX) 40 MG tablet Take 40 mg by mouth 2 (two) times daily.    . hydrALAZINE (APRESOLINE) 25 MG tablet Take 1 tablet (25 mg total) by mouth 2 (two) times daily. 180 tablet 3  . labetalol (NORMODYNE) 300 MG tablet Take 1 tablet by mouth 2 (two) times a day.    . Multiple Vitamin (MULTIVITAMIN) tablet Take 1 tablet by mouth daily.    . nitroGLYCERIN (NITROSTAT) 0.4 MG SL tablet Place 0.4 mg under the tongue every 5 (five) minutes as needed for chest pain.    Marland Kitchen tiotropium (SPIRIVA HANDIHALER) 18 MCG inhalation capsule Place 18 mcg into inhaler and inhale daily.     No current facility-administered medications for this visit.     Allergies:   Ipratropium, Morphine and related, Vicodin [hydrocodone-acetaminophen], and Penicillins   Social History:  The patient  reports that he quit smoking about 6 years ago. His smoking use included cigarettes. He has never used smokeless tobacco. He reports that he does not drink alcohol or use drugs.   Family History:  The patient's family history includes Heart disease in his mother and sister; Hypertension in his mother and sister; Thyroid disease in his brother and sister.  ROS:  Please see the history of present illness.  All other systems are reviewed and otherwise negative.   PHYSICAL EXAM:  VS:  BP (!) 167/84   Pulse 67   Ht 5\' 10"  (1.778 m)   Wt 178 lb (80.7 kg)   BMI 25.54 kg/m  BMI: Body mass index is 25.54 kg/m. Well nourished, well developed, in no acute distress  HEENT: normocephalic, atraumatic  Neck: no JVD, carotid bruits or masses Cardiac:  RRR; no significant murmurs, no rubs, or gallops Lungs:  CTA b/l, no wheezing, rhonchi or rales  Abd: soft, nontender MS: no deformity, age appropriate atrophy Ext: no edema   Skin: warm and dry, no rash Neuro:  No gross deficits appreciated Psych: euthymic mood, full affect  ICD site is stable, no tethering or discomfort   EKG:  Not done today ICD interrogation done today and reviewed by myself:  Battery and lead measurements are good No arrhythmias or therapies Underlying SB 50's today   12/29/2017: LHC  Mid LM lesion is 75% stenosed.  Ost LAD to Prox LAD lesion is 100% stenosed.  Ost 1st Mrg lesion is 100% stenosed.  Prox Cx to Mid Cx lesion is 100% stenosed.  Prox RCA to Mid RCA lesion is 100% stenosed.  LIMA graft was visualized by angiography and is normal in caliber.  The graft exhibits no disease.  Seq SVG- OM 1 and PDA graft was visualized by angiography and is normal in caliber.  SVG graft was visualized by angiography and is normal in caliber.  Origin lesion before 1st Mrg is 50% stenosed.  LV end diastolic pressure is moderately elevated.   1. Severe 3 vessel occlusive CAD 2. Patent LIMA to the LAD 3. Patent sequential SVG to the first OM and PDA. Patent Y graft SVG to the diagonal 4. Moderately elevated LVEDP of 30 mm Hg.  Plan: medical management. Diuresis per primary team.    12/27/2017 Study Conclusions - Left ventricle: The cavity size was normal. There was mild   concentric hypertrophy. Systolic function was mildly to   moderately reduced. The estimated ejection fraction was in the   range of 40% to 45%. Features are consistent with a pseudonormal   left ventricular filling pattern, with concomitant abnormal   relaxation and increased filling pressure (grade 2 diastolic   dysfunction). Doppler parameters are consistent with elevated   mean left atrial filling pressure. - Aortic valve: There was no regurgitation. - Mitral valve: There was trivial regurgitation. - Right ventricle: The cavity size was mildly dilated. Wall   thickness was normal. Pacer wire or catheter noted in right   ventricle. Systolic function was  normal. - Right atrium: Pacer wire or catheter noted in right atrium. - Tricuspid valve: There was mild regurgitation. - Pulmonary arteries: Systolic pressure was moderately increased.   PA peak pressure: 53 mm Hg (S). - Inferior vena cava: The vessel was normal in size. - Pericardium, extracardiac: There was no pericardial effusion.  Impressions:  - Since the prior study on 01/18/2016 LVEF has decreased from 50-55%   to 40-45% with basal and mid inferolateral and anterolateral   hypokinesis.   Recent Labs: No results found for requested labs within last 8760 hours.  No results found for requested labs within last 8760 hours.   CrCl cannot be calculated (Patient's most recent lab result is older than the maximum 21 days allowed.).   Wt Readings from Last 3 Encounters:  10/30/19 178 lb (80.7 kg)  10/30/19 180 lb 6.4 oz (81.8 kg)  10/14/19 183 lb (83 kg)     Other studies reviewed: Additional studies/records reviewed today include: summarized above  ASSESSMENT AND PLAN:  1. ICD     H/o VF arrest     Intact function, no changes made     On amiodarone  2.  CAD      No anginal symptoms      On statin, BB, prn NTG  3. ICM     No symptoms or exam findings of fluid OL     On BBm, hydralazine, furosemide      No ACE/ARB with CKD  4. Paroxysmal Afib     CHA2DS2Vasc is 6, on eliquis, appropriately dosed for age/weight     <1% burden    10/22/2019 (from Dr. Joelyn Oms)     Creat 1.97    5. HTN     He saw his nephrologist today, no changes were made to his medicines Manual bp today vs his machine note that SBP similar his machine about 31mmHg high, DBP his machine read 15-20 higher       He had equal BP on both arms manually  They are asked to keep a BP log, see the BP clinic in a few weeks with  his machine reading about 92mmHg high SBP and 37mmHg high DBP    Disposition: BP clinic as above, Q 3 mo remotes, and Dr. Lovena Le as scheduled.    I did not order while the  patient was in the office, though  have messaged my MA to add on TSH, LFTs for his amiodarone to his BP clinic visit, as well as CBC given his eliquis  Current medicines are reviewed at length with the patient today.  The patient did not have any concerns regarding medicines.  Venetia Night, PA-C 10/30/2019 5:10 PM     [place]  [id number] [phone] (office)  [phone] (fax)

## 2019-11-12 ENCOUNTER — Other Ambulatory Visit: Payer: Self-pay | Admitting: Internal Medicine

## 2019-11-12 MED ORDER — HYDRALAZINE HCL 25 MG PO TABS: 25.0000 mg | ORAL_TABLET | Freq: Two times a day (BID) | ORAL | 3 refills | Status: DC

## 2019-11-13 ENCOUNTER — Telehealth (INDEPENDENT_AMBULATORY_CARE_PROVIDER_SITE_OTHER): Payer: HMO | Admitting: Family Medicine

## 2019-11-13 ENCOUNTER — Other Ambulatory Visit: Payer: Self-pay

## 2019-11-13 DIAGNOSIS — R059 Cough, unspecified: Secondary | ICD-10-CM

## 2019-11-13 DIAGNOSIS — R05 Cough: Secondary | ICD-10-CM | POA: Diagnosis not present

## 2019-11-13 HISTORY — DX: Cough, unspecified: R05.9

## 2019-11-13 NOTE — Assessment & Plan Note (Signed)
May be due to aspiration vs sinus drainage.  Suggested follow up with pulmonologist and trial of nasal saline.  Wife concerned about pneumonia.  Suggested if he has fever or increased sputum or increased shortness of breath to contact us immediatly

## 2019-11-13 NOTE — Progress Notes (Signed)
[facility] Telemedicine Visit  Patient consented to have virtual visit. Method of visit: Telephone  Encounter participants: Patient: Laine Rasler - located at home Provider: Lind Covert - located at office Others (if applicable): wife  Chief Complaint: Cough  HPI:  Has been having a cough primarily when he eats for weeks. Usually clear phlegm but today was slightly yellow and his wife is concerned.  Most history supplied by wife.  Patient initially denied any cough and relates currently he has no shortness of breath or any problems with breathing According to wife has chronic copd with somewhat increased shortness of breath over the last months.  Scheduled for PFTs with his pulmonologist next week No fever or sputum when not eating and is doing his usual activities  ROS: per HPI  Pertinent PMHx: COPD Dementia  Exam:  Respiratory: normal speech pattern No hoarseness or evident wheeze  Assessment/Plan:  Cough May be due to aspiration vs sinus drainage.  Suggested follow up with pulmonologist and trial of nasal saline.  Wife concerned about pneumonia.  Suggested if he has fever or increased sputum or increased shortness of breath to contact us immediatly     Time spent during visit with patient: 13 minutes

## 2019-11-14 ENCOUNTER — Other Ambulatory Visit: Payer: Self-pay | Admitting: Pharmacist

## 2019-11-14 NOTE — Patient Outreach (Addendum)
[facility]) Care Management  11/14/2019  Joe Wiggins 1946/04/06 [id number]   Patient was called regarding medication assistance and CSNP medication review. Unfortunately, patient did not answer the phone. HIPAA compliant message was left on his voicemail.  Patient has been called multiple times without success.  Plan: Call patient back in 4 months.  Elayne Guerin, PharmD, BCACP [facility] Clinical Pharmacist [phone]  ADDENDUM  [name] Joe Wiggins) called me back. HIPAA identifiers were obtained.   Patient is a 73 year old male with multiple medical conditions including but not limited to:  Anemia, A fib, CAS, CAD, pacemaker placement, COPD, history of TIA and cardiac arrest, hypertension, hyperlipidemia, A fib, seasonal allergies and sleep apnea.   [name] did not report any issues with his medications. He is already receiving Full Extra Help.  Medications were reviewed:  Medications Reviewed Today    Reviewed by Elayne Guerin, [facility] (Pharmacist) on 11/14/19 at 1511  Med List Status: <None>  Medication Order Taking? Sig Documenting Provider Last Dose Status Informant  albuterol (ACCUNEB) 0.63 MG/3ML nebulizer solution [id number] Yes 3 ml inhale orally via nebulizer four times a day [provider] Taking Active            Med Note Juleen China, Deno Etienne   Wed May 29, 2019 11:53 AM) As needed  albuterol (PROVENTIL HFA;VENTOLIN HFA) 108 (90 Base) MCG/ACT inhaler [id number] Yes Inhale 2 puffs into the lungs every 6 (six) hours as needed.  [provider] Taking Active Other  amiodarone (PACERONE) 200 MG tablet [id number] Yes Take one tablet by mouth daily Monday through Saturday.  DO NOT take on Sunday. Evans Lance, MD Taking Active   apixaban [facility]) 5 MG TABS tablet [id number] Yes Take 1 tablet (5 mg total) by mouth 2 (two) times daily. Evans Lance, MD Taking Active   ascorbic acid (VITAMIN C) 500 MG tablet [id number] Yes Take 500 mg by  mouth daily. [provider] Taking Active   atorvastatin (LIPITOR) 40 MG tablet [id number] Yes Take 1 tablet (40 mg total) by mouth daily. Evans Lance, MD Taking Active   cholecalciferol (VITAMIN D) 1000 units tablet [id number] Yes Take 1,000 Units by mouth daily. [provider] Taking Active Other  Cyanocobalamin (VITAMIN B 12 PO) [id number] Yes Take by mouth daily. [provider] Taking Active Other  famotidine (PEPCID) 20 MG tablet [id number] Yes Take 20 mg by mouth daily. [provider] Taking Active Other           Med Note Juleen China, Deno Etienne   Wed May 29, 2019 11:54 AM) Dewaine Conger as needed.  ferrous sulfate 325 (65 FE) MG tablet [id number] Yes Take 325 mg by mouth daily with breakfast. [provider] Taking Active   Fluticasone-Salmeterol (ADVAIR) 250-50 MCG/DOSE AEPB [id number] Yes Inhale 1 puff into the lungs 2 (two) times daily. [provider] Taking Active Other  folic acid (FOLVITE) A999333 MCG tablet [id number] Yes Take 400 mcg by mouth daily. [provider] Taking Active Other  furosemide (LASIX) 40 MG tablet [id number] Yes Take 40 mg by mouth 2 (two) times daily. [provider] Taking Active   hydrALAZINE (APRESOLINE) 25 MG tablet [id number] Yes Take 1 tablet (25 mg total) by mouth 2 (two) times daily. Evans Lance, MD Taking Active   labetalol (NORMODYNE) 300 MG tablet [id number] Yes Take 1 tablet by mouth 2 (two) times a day. [provider] Taking Active   Multiple Vitamin (MULTIVITAMIN) tablet [id number] Yes  Take 1 tablet by mouth daily. [provider] Taking Active Other  nitroGLYCERIN (NITROSTAT) 0.4 MG SL tablet [id number] Yes Place 0.4 mg under the tongue every 5 (five) minutes as needed for chest pain. [provider] Taking Active Other  tiotropium (SPIRIVA HANDIHALER) 18 MCG inhalation capsule [id number] Yes Place 18 mcg into inhaler and inhale daily. [provider] Taking Active Other          Plan: Follow up with patient in 4 months.  Elayne Guerin, PharmD, [place] Clinical Pharmacist [phone]

## 2019-11-15 ENCOUNTER — Ambulatory Visit: Payer: Self-pay | Admitting: Pharmacist

## 2019-11-15 DIAGNOSIS — H35373 Puckering of macula, bilateral: Secondary | ICD-10-CM | POA: Diagnosis not present

## 2019-11-15 DIAGNOSIS — H43811 Vitreous degeneration, right eye: Secondary | ICD-10-CM | POA: Diagnosis not present

## 2019-11-15 DIAGNOSIS — H18513 Endothelial corneal dystrophy, bilateral: Secondary | ICD-10-CM | POA: Diagnosis not present

## 2019-11-15 DIAGNOSIS — H524 Presbyopia: Secondary | ICD-10-CM | POA: Diagnosis not present

## 2019-11-15 DIAGNOSIS — H52223 Regular astigmatism, bilateral: Secondary | ICD-10-CM | POA: Diagnosis not present

## 2019-11-15 DIAGNOSIS — H26491 Other secondary cataract, right eye: Secondary | ICD-10-CM | POA: Diagnosis not present

## 2019-11-17 NOTE — Progress Notes (Signed)
Patient ID: Joe Wiggins                 DOB: 04/08/1946                      MRN: [id number]     HPI: Joe Wiggins is a 73 y.o. male patient of Dr. Wynonia Lawman and Dr. Lovena Le, who was referred by Tommye Standard, PA-C to HTN clinic. PMH is significant for  history of ICM, CAD s/p CABG, paroxysmal atrial fibrillation, COPD, HTN, OSA, dementia and prior TIA, sinus node dysfunction PPM, CKD (IV). Patient follows with nephrology, Dr. Joelyn Oms.   Of note, patient's home blood pressure machine was calibrated at last appt with Tommye Standard, PA-C on 10/30/2019. His systolic blood pressure appeared ~24mmHg elevated and his diastolic blood pressure appeared ~15-20 mmHg elevated on his home BP machine compared to manual BP readings. His BP was equal on both arms manually. He was instructed to keep a BP log of his BP readings.  At 10/30/2019 appt with Tommye Standard, patient and patient's wife had concerns regarding patient's blood pressure. They had noticed his SBP trended ~150 mmHg upon sitting and ~200 mmHg upon standing. His DBP trends 80-90 mmHg. No medication changes were made.  Patient presents for initial appointment with HTN clinic.Per wife's report she thinks he has had issues with dizziness every since starting hydralazine. She did bring home blood pressure meter and log. Most information was received from the wife.   Wife does not remember why amlodipine was stopped, but thinks it has to do with low blood pressure. She also is not sure why he is not on an ACE or ARB. Wife asks about if patient needs to be on amiodarone as his eye Dr.  Rozell Searing it is causing cornea issues. I told her I would defer to Dr. Lovena Le for this.  On first check patient blood pressure was 160/72. On recheck it was 142/70. Compared to home meter, home meter was again found to be about [id number] higher systolic. Home readings were variable, with a few readings at goal, but mostly above goal.  Current HTN meds: hydralazine 25 mg BID, labetalol 300 mg  BID, furosemide 40 mg BID Previously tried: amlodipine 5 mg daily (low blood pressure), torsemide 20 mg daily, metoprolol succinate 25 mg daily (faiting) BP goal: < 130/80 mmHg  Family History: mother (CV disease, HTN); sister (CV disease, HTN, thyroid disease); brother (thyroid disease)  Social History: former smoker (1 pack per 35 years; quit 01/29/2013), no alcohol use  Diet: cheetos, potato chips, tomatoes, no table salt, does cook with salt  Exercise:   Home BP readings: 163/87, 159/82, 127/69, 137/76, 147/75, 142/72, 162/83, 163/84, 152/82, 146/78, 154/78, 135/76 (home meter runs about 10-15 points higher systolic)  Wt Readings from Last 3 Encounters:  10/30/19 178 lb (80.7 kg)  10/30/19 180 lb 6.4 oz (81.8 kg)  10/14/19 183 lb (83 kg)   BP Readings from Last 3 Encounters:  10/30/19 (!) 167/84  10/30/19 (!) 145/75  10/14/19 (!) 172/76   Pulse Readings from Last 3 Encounters:  10/30/19 67  10/30/19 70  10/14/19 67    Renal function: CrCl cannot be calculated (Patient's most recent lab result is older than the maximum 21 days allowed.).  Past Medical History:  Diagnosis Date  . AF (paroxysmal atrial fibrillation) ([place])   . AICD (automatic cardioverter/defibrillator) present   . CAD (coronary artery disease)   . Cardiomyopathy, ischemic   . Carotid artery occlusion   .  Chronic kidney disease   . COPD (chronic obstructive pulmonary disease) ([place])   . Dementia without behavioral disturbance ([place])   . GERD (gastroesophageal reflux disease)   . Hyperlipidemia   . Hypertension   . Pneumonia   . Sleep apnea   . Stroke ([place])   . TIA (transient ischemic attack)     Current Outpatient Medications on File Prior to Visit  Medication Sig Dispense Refill  . albuterol (ACCUNEB) 0.63 MG/3ML nebulizer solution 3 ml inhale orally via nebulizer four times a day    . albuterol (PROVENTIL HFA;VENTOLIN HFA) 108 (90 Base) MCG/ACT inhaler Inhale 2 puffs into the lungs every 6 (six)  hours as needed.     Marland Kitchen amiodarone (PACERONE) 200 MG tablet Take one tablet by mouth daily Monday through Saturday.  DO NOT take on Sunday. 90 tablet 2  . apixaban (ELIQUIS) 5 MG TABS tablet Take 1 tablet (5 mg total) by mouth 2 (two) times daily. 180 tablet 1  . ascorbic acid (VITAMIN C) 500 MG tablet Take 500 mg by mouth daily.    Marland Kitchen atorvastatin (LIPITOR) 40 MG tablet Take 1 tablet (40 mg total) by mouth daily. 90 tablet 3  . cholecalciferol (VITAMIN D) 1000 units tablet Take 1,000 Units by mouth daily.    . Cyanocobalamin (VITAMIN B 12 PO) Take by mouth daily.    . famotidine (PEPCID) 20 MG tablet Take 20 mg by mouth daily.    . ferrous sulfate 325 (65 FE) MG tablet Take 325 mg by mouth daily with breakfast.    . Fluticasone-Salmeterol (ADVAIR) 250-50 MCG/DOSE AEPB Inhale 1 puff into the lungs 2 (two) times daily.    . folic acid (FOLVITE) A999333 MCG tablet Take 400 mcg by mouth daily.    . furosemide (LASIX) 40 MG tablet Take 40 mg by mouth 2 (two) times daily.    . hydrALAZINE (APRESOLINE) 25 MG tablet Take 1 tablet (25 mg total) by mouth 2 (two) times daily. 180 tablet 3  . labetalol (NORMODYNE) 300 MG tablet Take 1 tablet by mouth 2 (two) times a day.    . Multiple Vitamin (MULTIVITAMIN) tablet Take 1 tablet by mouth daily.    . nitroGLYCERIN (NITROSTAT) 0.4 MG SL tablet Place 0.4 mg under the tongue every 5 (five) minutes as needed for chest pain.    Marland Kitchen tiotropium (SPIRIVA HANDIHALER) 18 MCG inhalation capsule Place 18 mcg into inhaler and inhale daily.     No current facility-administered medications on file prior to visit.     Allergies  Allergen Reactions  . Ipratropium Other (See Comments)  . Morphine And Related Nausea Only  . Vicodin [Hydrocodone-Acetaminophen] Other (See Comments)    Hallucinations   . Penicillins Rash     Assessment/Plan:  1. Hypertension - BP goal < 130/80 mmHg; therefore, patient is not at goal. Discussed medication options with patient and his wife.  Discussed potentially stopping hydralazine due to dizziness and starting amlodipine, but wife seemed hesitant. Do not want to increase hydralazine dose if it truly is casing dizziness. Also discussed potentially adding low dose ACE or ARB. Will need to discuss this with nephrology before starting. Wife wishes for [place] to talk with Dr. Joelyn Oms about adding ACE/ARB as they can be renal protective. Will reach out to Dr. Joelyn Oms and get his opinion. Will call patient's wife once a decision is made.  Thank you for involving pharmacy to assist in providing Mr. Foree care.   Ramond Dial, Pharm.D, BCPS, CPP [facility] [name] [place], [place] [address]  Phone: [phone]; Fax: [phone]

## 2019-11-18 ENCOUNTER — Other Ambulatory Visit: Payer: Self-pay

## 2019-11-18 DIAGNOSIS — I119 Hypertensive heart disease without heart failure: Secondary | ICD-10-CM

## 2019-11-19 ENCOUNTER — Other Ambulatory Visit: Payer: HMO

## 2019-11-19 ENCOUNTER — Ambulatory Visit (INDEPENDENT_AMBULATORY_CARE_PROVIDER_SITE_OTHER): Payer: HMO | Admitting: Pharmacist

## 2019-11-19 ENCOUNTER — Encounter: Payer: Self-pay | Admitting: Pharmacist

## 2019-11-19 ENCOUNTER — Other Ambulatory Visit: Payer: Self-pay

## 2019-11-19 VITALS — BP 142/70 | HR 65 | Resp 97

## 2019-11-19 DIAGNOSIS — I119 Hypertensive heart disease without heart failure: Secondary | ICD-10-CM

## 2019-11-19 DIAGNOSIS — J440 Chronic obstructive pulmonary disease with acute lower respiratory infection: Secondary | ICD-10-CM | POA: Diagnosis not present

## 2019-11-19 DIAGNOSIS — Z87891 Personal history of nicotine dependence: Secondary | ICD-10-CM | POA: Diagnosis not present

## 2019-11-19 NOTE — Patient Instructions (Signed)
It was a pleasure to meet you today.  I will discuss with Dr. Marylou Flesher about potentially adding a low dose of an ACE or ARB. I will call you once I discuss with him  If you have any questions please feel free to call us at [phone]

## 2019-11-25 ENCOUNTER — Other Ambulatory Visit: Payer: Self-pay

## 2019-11-25 NOTE — Patient Outreach (Signed)
[facility]) Care Management Chronic Special Needs Program  11/25/2019  Name: Joe Wiggins DOB: Oct 23, 1946  MRN: [id number]  [name] is enrolled in a chronic special needs plan for Heart Failure. Reviewed and updated care plan.  RNCM spoke with client's wife, caregiver.client verbally consented to speak with wife as client has history of dementia.  Subjective: Joe Wiggins reports client is doing well. She states he is able to obtain all his medications and she takes him to his provider appointments. She states client continues to weight self daily and his weight ranges between 170-172 pounds. She denies any edema. She reports client saw cardiologist about "a week and a half ago". Client being seen at the HTN clinic for increased blood pressure. She reports his blood pressure is better now and states today it was 140/72. She states client is seeing pulmonologist for breathing issue. She reports he has a follow up appointment with pulmonologist on Thursday to obtain results related to "breathing problem". No acute concerns or issues today.  Goals Addressed            This Visit's Progress   . COMPLETED: Client understands the importance of follow-up with providers by attending scheduled visits       Joe Wiggins voiced importance of attending provider visit.    . COMPLETED: Client verbalize knowledge of Heart Failure disease self management skills        Reports weights daily, attending provider visits as scheduled, taking medications as prescribed and when to call the doctor.    . COMPLETED: Client will not report change from baseline and no repeated symptoms of stroke with in the next 6-9 months.        No report of change from baseline    . COMPLETED: Client will report no fall or injuries in the next 6-9 months.       Denies any falls.    . COMPLETED: Client will report no worsening of symptoms of Atrial Fibrillation within the next 6-9 months       Denies any  worsening of symptoms    . Client will verbalize knowledge of chronic lung disease as evidenced by no ED visits or Inpatient stays related to chronic lung disease    On track   . Client will verbalize knowledge of self management of Hypertension as evidences by BP reading of 140/90 or less; or as defined by provider   On track   . COMPLETED: Client/Caregiver will verbalize understanding of instructions related to self-care and safety       Joe Wiggins assist client with medications, appointments, transportation as needed,     . COMPLETED: Maintain timely refills of Heart Failure medication as prescribed within the year        Denies any issues obtaining medication    . COMPLETED: Obtain annual  Lipid Profile, LDL-C       Per Joe Wiggins done by nephrologist    . COMPLETED: Visit Primary Care Provider or Cardiologist at least 2 times per year       Per Joe Wiggins done      Covid-19 precautions discussed. RNCM reinforced the availability of the 24 hour nurse advice line as needed. RNCM reinforced the health care concierge available for beneftis questions. Also encouraged client/caregiver to call RNCM as needed.   Plan: RNCM will send updated care plan to client; send updated care plan to primary care. RNCM will outreach within the next 6-9 months.  Thea Silversmith, RN,  MSN, [place] [phone]     .

## 2019-11-26 LAB — HEPATIC FUNCTION PANEL
ALT: 24 IU/L (ref 0–44)
AST: 25 IU/L (ref 0–40)
Albumin: 3.8 g/dL (ref 3.7–4.7)
Alkaline Phosphatase: 143 IU/L — ABNORMAL HIGH (ref 39–117)
Bilirubin Total: 0.3 mg/dL (ref 0.0–1.2)
Bilirubin, Direct: 0.1 mg/dL (ref 0.00–0.40)
Total Protein: 6.3 g/dL (ref 6.0–8.5)

## 2019-11-26 LAB — CBC
Hematocrit: 33.9 % — ABNORMAL LOW (ref 37.5–51.0)
Hemoglobin: 10.9 g/dL — ABNORMAL LOW (ref 13.0–17.7)
MCH: 29.5 pg (ref 26.6–33.0)
MCHC: 32.2 g/dL (ref 31.5–35.7)
MCV: 92 fL (ref 79–97)
Platelets: 375 10*3/uL (ref 150–450)
RBC: 3.7 x10E6/uL — ABNORMAL LOW (ref 4.14–5.80)
RDW: 14.6 % (ref 11.6–15.4)
WBC: 6.3 10*3/uL (ref 3.4–10.8)

## 2019-11-26 LAB — TSH: TSH: 0.987 u[IU]/mL (ref 0.450–4.500)

## 2019-11-27 ENCOUNTER — Telehealth: Payer: Self-pay | Admitting: Pharmacist

## 2019-11-27 NOTE — Telephone Encounter (Signed)
Left message on Dr. Marylou Flesher clinical assistance voicemail asking is Dr. Joelyn Oms is ok with patient starting ARB. Requested call back

## 2019-11-28 DIAGNOSIS — J440 Chronic obstructive pulmonary disease with acute lower respiratory infection: Secondary | ICD-10-CM | POA: Diagnosis not present

## 2019-11-28 DIAGNOSIS — J9809 Other diseases of bronchus, not elsewhere classified: Secondary | ICD-10-CM | POA: Diagnosis not present

## 2019-11-28 DIAGNOSIS — R918 Other nonspecific abnormal finding of lung field: Secondary | ICD-10-CM | POA: Diagnosis not present

## 2019-11-28 DIAGNOSIS — R300 Dysuria: Secondary | ICD-10-CM | POA: Diagnosis not present

## 2019-11-28 DIAGNOSIS — G4733 Obstructive sleep apnea (adult) (pediatric): Secondary | ICD-10-CM | POA: Diagnosis not present

## 2019-11-28 DIAGNOSIS — I5020 Unspecified systolic (congestive) heart failure: Secondary | ICD-10-CM | POA: Diagnosis not present

## 2019-11-28 DIAGNOSIS — I255 Ischemic cardiomyopathy: Secondary | ICD-10-CM | POA: Diagnosis not present

## 2019-11-28 DIAGNOSIS — I130 Hypertensive heart and chronic kidney disease with heart failure and stage 1 through stage 4 chronic kidney disease, or unspecified chronic kidney disease: Secondary | ICD-10-CM | POA: Diagnosis not present

## 2019-11-28 NOTE — Telephone Encounter (Signed)
Received call back from [place], she discussed with Dr Joelyn Oms who is ok with pt starting a low dose of ACEi or ARB if  BMET is checked 7 days later.

## 2019-11-28 NOTE — Telephone Encounter (Signed)
Called and spoke with patients wife who states he saw his lung doctor today who did lab work and they just called her to tell her that his kidney # were up and they were sending the results to Dr. Adin Hector office. Will hold off now on ordering low dose ACE. Per wife patients BP was running good, but was high today. Wife will call us back once she speaks with nephrology office.

## 2019-12-02 NOTE — Telephone Encounter (Addendum)
Called and spoke with patient wife. She still has not spoken with Dr. Joelyn Oms. States his blood pressure has been running 130-140's/, 139/78, 133/72 I advised that we try adding a low dose amlodipine 2.5mg  daily. Wife would like to speak with Dr. Joelyn Oms first and then call us back.  Addendum: patients home blood pressure meter runs about 10-15 higher. Will need to find out if these readings are directly from the machine or adjusted for difference. Will also need to encourage wife to buy a new meter

## 2019-12-06 ENCOUNTER — Ambulatory Visit: Payer: HMO

## 2019-12-16 ENCOUNTER — Other Ambulatory Visit: Payer: Self-pay | Admitting: *Deleted

## 2019-12-16 MED ORDER — AMIODARONE HCL 200 MG PO TABS: ORAL_TABLET | ORAL | 2 refills | Status: DC

## 2019-12-25 ENCOUNTER — Ambulatory Visit: Payer: HMO | Attending: Internal Medicine

## 2019-12-25 DIAGNOSIS — U071 COVID-19: Secondary | ICD-10-CM | POA: Diagnosis not present

## 2019-12-25 DIAGNOSIS — R238 Other skin changes: Secondary | ICD-10-CM

## 2019-12-27 LAB — NOVEL CORONAVIRUS, NAA: SARS-CoV-2, NAA: NOT DETECTED

## 2019-12-30 ENCOUNTER — Telehealth: Payer: Self-pay | Admitting: Internal Medicine

## 2019-12-30 ENCOUNTER — Other Ambulatory Visit: Payer: Self-pay

## 2019-12-30 MED ORDER — AMIODARONE HCL 200 MG PO TABS: ORAL_TABLET | ORAL | 2 refills | Status: DC

## 2019-12-30 NOTE — Telephone Encounter (Signed)
New Message     *STAT* If patient is at the pharmacy, call can be transferred to refill team.   1. Which medications need to be refilled? (please list name of each medication and dose if known) amiodarone (PACERONE) 200 MG tablet   2. Which pharmacy/location (including street and city if local pharmacy) is medication to be sent to? [facility] on [address]   3. Do they need a 30 day or 90 day supply? 90 day

## 2020-01-02 ENCOUNTER — Other Ambulatory Visit: Payer: Self-pay | Admitting: Pharmacist

## 2020-01-02 MED ORDER — APIXABAN 5 MG PO TABS: 5.0000 mg | ORAL_TABLET | Freq: Two times a day (BID) | ORAL | 1 refills | Status: DC

## 2020-01-02 NOTE — Progress Notes (Signed)
Age 74, weight 81kg, SCr 1.97 on 10/22/19, last OV Nov 2020, afib indication

## 2020-01-17 ENCOUNTER — Telehealth: Payer: Self-pay

## 2020-01-17 ENCOUNTER — Ambulatory Visit (INDEPENDENT_AMBULATORY_CARE_PROVIDER_SITE_OTHER): Payer: HMO | Admitting: *Deleted

## 2020-01-17 DIAGNOSIS — H26493 Other secondary cataract, bilateral: Secondary | ICD-10-CM | POA: Diagnosis not present

## 2020-01-17 DIAGNOSIS — I469 Cardiac arrest, cause unspecified: Secondary | ICD-10-CM | POA: Diagnosis not present

## 2020-01-17 DIAGNOSIS — H18003 Unspecified corneal deposit, bilateral: Secondary | ICD-10-CM | POA: Diagnosis not present

## 2020-01-17 DIAGNOSIS — H18519 Endothelial corneal dystrophy, unspecified eye: Secondary | ICD-10-CM | POA: Diagnosis not present

## 2020-01-17 DIAGNOSIS — Z961 Presence of intraocular lens: Secondary | ICD-10-CM | POA: Diagnosis not present

## 2020-01-17 LAB — CUP PACEART REMOTE DEVICE CHECK
Battery Remaining Longevity: 54 mo
Battery Remaining Percentage: 69 %
Battery Voltage: 2.95 V
Brady Statistic AP VP Percent: 1.2 %
Brady Statistic AP VS Percent: 97 %
Brady Statistic AS VP Percent: 1.7 %
Brady Statistic AS VS Percent: 1 %
Brady Statistic RA Percent Paced: 98 %
Brady Statistic RV Percent Paced: 2.9 %
Date Time Interrogation Session: 20210122104052
HighPow Impedance: 74 Ohm
HighPow Impedance: 74 Ohm
Implantable Lead Implant Date: 20140217
Implantable Lead Implant Date: 20190104
Implantable Lead Location: 753859
Implantable Lead Location: 753860
Implantable Lead Model: 7122
Implantable Pulse Generator Implant Date: 20190104
Lead Channel Impedance Value: 350 Ohm
Lead Channel Impedance Value: 590 Ohm
Lead Channel Pacing Threshold Amplitude: 1 V
Lead Channel Pacing Threshold Amplitude: 1.25 V
Lead Channel Pacing Threshold Pulse Width: 0.5 ms
Lead Channel Pacing Threshold Pulse Width: 0.8 ms
Lead Channel Sensing Intrinsic Amplitude: 0.9 mV
Lead Channel Sensing Intrinsic Amplitude: 12 mV
Lead Channel Setting Pacing Amplitude: 2.5 V
Lead Channel Setting Pacing Amplitude: 2.5 V
Lead Channel Setting Pacing Pulse Width: 0.5 ms
Lead Channel Setting Sensing Sensitivity: 0.5 mV
Pulse Gen Serial Number: 9777027

## 2020-01-17 NOTE — Telephone Encounter (Signed)
Spoke with patient to remind of missed remote transmission 

## 2020-01-20 ENCOUNTER — Telehealth: Payer: Self-pay | Admitting: Internal Medicine

## 2020-01-20 ENCOUNTER — Other Ambulatory Visit: Payer: Self-pay

## 2020-01-20 ENCOUNTER — Ambulatory Visit (INDEPENDENT_AMBULATORY_CARE_PROVIDER_SITE_OTHER): Payer: HMO | Admitting: Family Medicine

## 2020-01-20 VITALS — BP 156/72 | HR 59 | Wt 183.0 lb

## 2020-01-20 DIAGNOSIS — M10371 Gout due to renal impairment, right ankle and foot: Secondary | ICD-10-CM

## 2020-01-20 DIAGNOSIS — I10 Essential (primary) hypertension: Secondary | ICD-10-CM | POA: Diagnosis not present

## 2020-01-20 MED ORDER — PREDNISONE 10 MG (21) PO TBPK: ORAL_TABLET | ORAL | 1 refills | Status: DC

## 2020-01-20 MED ORDER — PREDNISONE 50 MG PO TABS: ORAL_TABLET | ORAL | 0 refills | Status: DC

## 2020-01-20 NOTE — Telephone Encounter (Signed)
  Wife is calling because she will need to come with the patient to his appt with Dr Lovena Le on 01/23/20 because the patient has dementia.

## 2020-01-20 NOTE — Patient Instructions (Signed)
 Gout  Gout is painful swelling of your joints. Gout is a type of arthritis. It is caused by having too much uric acid in your body. Uric acid is a chemical that is made when your body breaks down substances called purines. If your body has too much uric acid, sharp crystals can form and build up in your joints. This causes pain and swelling. Gout attacks can happen quickly and be very painful (acute gout). Over time, the attacks can affect more joints and happen more often (chronic gout). What are the causes?  Too much uric acid in your blood. This can happen because: ? Your kidneys do not remove enough uric acid from your blood. ? Your body makes too much uric acid. ? You eat too many foods that are high in purines. These foods include organ meats, some seafood, and beer.  Trauma or stress. What increases the risk?  Having a family history of gout.  Being male and middle-aged.  Being male and having gone through menopause.  Being very overweight (obese).  Drinking alcohol, especially beer.  Not having enough water in the body (being dehydrated).  Losing weight too quickly.  Having an organ transplant.  Having lead poisoning.  Taking certain medicines.  Having kidney disease.  Having a skin condition called psoriasis. What are the signs or symptoms? An attack of acute gout usually happens in just one joint. The most common place is the big toe. Attacks often start at night. Other joints that may be affected include joints of the feet, ankle, knee, fingers, wrist, or elbow. Symptoms of an attack may include:  Very bad pain.  Warmth.  Swelling.  Stiffness.  Shiny, red, or purple skin.  Tenderness. The affected joint may be very painful to touch.  Chills and fever. Chronic gout may cause symptoms more often. More joints may be involved. You may also have white or yellow lumps (tophi) on your hands or feet or in other areas near your joints. How is this  treated?  Treatment for this condition has two phases: treating an acute attack and preventing future attacks.  Acute gout treatment may include: ? NSAIDs. ? Steroids. These are taken by mouth or injected into a joint. ? Colchicine. This medicine relieves pain and swelling. It can be given by mouth or through an IV tube.  Preventive treatment may include: ? Taking small doses of NSAIDs or colchicine daily. ? Using a medicine that reduces uric acid levels in your blood. ? Making changes to your diet. You may need to see a food expert (dietitian) about what to eat and drink to prevent gout. Follow these instructions at home: During a gout attack   If told, put ice on the painful area: ? Put ice in a plastic bag. ? Place a towel between your skin and the bag. ? Leave the ice on for 20 minutes, 2-3 times a day.  Raise (elevate) the painful joint above the level of your heart as often as you can.  Rest the joint as much as possible. If the joint is in your leg, you may be given crutches.  Follow instructions from your doctor about what you cannot eat or drink. Avoiding future gout attacks  Eat a low-purine diet. Avoid foods and drinks such as: ? Liver. ? Kidney. ? Anchovies. ? Asparagus. ? Herring. ? Mushrooms. ? Mussels. ? Beer.  Stay at a healthy weight. If you want to lose weight, talk with your doctor. Do not lose   weight too fast.  Start or continue an exercise plan as told by your doctor. Eating and drinking  Drink enough fluids to keep your pee (urine) pale yellow.  If you drink alcohol: ? Limit how much you use to:  0-1 drink a day for women.  0-2 drinks a day for men. ? Be aware of how much alcohol is in your drink. In the [place] one drink equals one 12 oz bottle of beer (355 mL), one 5 oz glass of wine (148 mL), or one 1 oz glass of hard liquor (44 mL). General instructions  Take over-the-counter and prescription medicines only as told by your doctor.  Do  not drive or use heavy machinery while taking prescription pain medicine.  Return to your normal activities as told by your doctor. Ask your doctor what activities are safe for you.  Keep all follow-up visits as told by your doctor. This is important. Contact a doctor if:  You have another gout attack.  You still have symptoms of a gout attack after 10 days of treatment.  You have problems (side effects) because of your medicines.  You have chills or a fever.  You have burning pain when you pee (urinate).  You have pain in your lower back or belly. Get help right away if:  You have very bad pain.  Your pain cannot be controlled.  You cannot pee. Summary  Gout is painful swelling of the joints.  The most common site of pain is the big toe, but it can affect other joints.  Medicines and avoiding some foods can help to prevent and treat gout attacks. This information is not intended to replace advice given to you by your health care provider. Make sure you discuss any questions you have with your health care provider. Document Revised: 07/04/2018 Document Reviewed: 07/04/2018 Elsevier Patient Education  2020 Elsevier Inc.  

## 2020-01-20 NOTE — Progress Notes (Signed)
Subjective: Chief Complaint  Patient presents with  . right foot swelling     HPI: Joe Wiggins is a 74 y.o. presenting to clinic today to discuss the following:  Gout Patient states yesterday he went to bed and then woke up today with pain, redness, and swelling around his right big toe. It is warm to touch. He denies any falls, injuries, cuts or scraps to the area. He has never had the before. It is very tender to the touch and it is painful to walk on it but he can ambulate. No other symptoms such as fever, chills, nausea, vomiting, or diarrhea.     ROS noted in HPI.    Social History   Tobacco Use  Smoking Status Former Smoker  . Types: Cigarettes  . Quit date: 11/30/2012  . Years since quitting: 7.1  Smokeless Tobacco Never Used    Objective: BP (!) 156/72   Pulse (!) 59   Wt 183 lb (83 kg)   SpO2 97%   BMI 26.26 kg/m  Vitals and nursing notes reviewed  Physical Exam Gen: Alert and Oriented x 3, NAD Ext: no clubbing, cyanosis, or edema Neuro: No gross deficits Skin: warm, dry, intact, area or warmth and erythema located around the right MTP joint. TTP.   Results for orders placed or performed in visit on 01/20/20 (from the past 72 hour(s))  CBC with Differential     Status: Abnormal   Collection Time: 01/20/20  4:05 PM  Result Value Ref Range   WBC 7.5 3.4 - 10.8 x10E3/uL   RBC 3.85 (L) 4.14 - 5.80 x10E6/uL   Hemoglobin 11.8 (L) 13.0 - 17.7 g/dL   Hematocrit 35.2 (L) 37.5 - 51.0 %   MCV 91 79 - 97 fL   MCH 30.6 26.6 - 33.0 pg   MCHC 33.5 31.5 - 35.7 g/dL   RDW 15.2 11.6 - 15.4 %   Platelets 342 150 - 450 x10E3/uL   Neutrophils 75 Not Estab. %   Lymphs 9 Not Estab. %   Monocytes 9 Not Estab. %   Eos 4 Not Estab. %   Basos 1 Not Estab. %   Neutrophils Absolute 5.7 1.4 - 7.0 x10E3/uL   Lymphocytes Absolute 0.7 0.7 - 3.1 x10E3/uL   Monocytes Absolute 0.7 0.1 - 0.9 x10E3/uL   EOS (ABSOLUTE) 0.3 0.0 - 0.4 x10E3/uL   Basophils Absolute 0.1 0.0 - 0.2  x10E3/uL   Immature Granulocytes 2 Not Estab. %   Immature Grans (Abs) 0.1 0.0 - 0.1 x10E3/uL  Uric Acid     Status: Abnormal   Collection Time: 01/20/20  4:05 PM  Result Value Ref Range   Uric Acid 9.2 (H) 3.8 - 8.4 mg/dL    Comment:            Therapeutic target for gout patients: [id number]  Basic Metabolic Panel     Status: Abnormal   Collection Time: 01/20/20  4:05 PM  Result Value Ref Range   Glucose 98 65 - 99 mg/dL   BUN 25 8 - 27 mg/dL   Creatinine, Ser 2.01 (H) 0.76 - 1.27 mg/dL   GFR calc non Af Amer 32 (L) >59 mL/min/1.73   GFR calc Af Amer 37 (L) >59 mL/min/1.73   BUN/Creatinine Ratio 12 10 - 24   Sodium 138 134 - 144 mmol/L   Potassium 4.7 3.5 - 5.2 mmol/L   Chloride 100 96 - 106 mmol/L   CO2 23 20 - 29 mmol/L  Calcium 10.1 8.6 - 10.2 mg/dL    Assessment/Plan:  Hypertension Controlled, stable. - Repeating BMP today  Gout Patient has gout flare for first time. Most likely due to chronic kidney disease. Uric acid elevated. I did consider cellulitis so got a CBC to check for leukocytosis but considering his history and exam I had very low suspicion for cellulitis. - Due to GFR around 30 will avoid NSAIDS - Prednisone 50mg  for 4 days, then taper down by 10mg  every two days, then stop. - F/u if no improvement.   PATIENT EDUCATION PROVIDED: See AVS    Diagnosis and plan along with any newly prescribed medication(s) were discussed in detail with this patient today. The patient verbalized understanding and agreed with the plan. Patient advised if symptoms worsen return to clinic or ER.     Orders Placed This Encounter  Procedures  . CBC with Differential  . Uric Acid  . Basic Metabolic Panel    Meds ordered this encounter  Medications  . predniSONE (STERAPRED UNI-PAK 21 TAB) 10 MG (21) TBPK tablet    Sig: Please take 4 tablets for two days, then 3 tablets for two days, then two tablets for two day, then one tablet for three days    Dispense:  21 tablet     Refill:  1  . predniSONE (DELTASONE) 50 MG tablet    Sig: Take one tablet for four days    Dispense:  4 tablet    Refill:  0     Harolyn Rutherford, DO 01/20/2020, 3:22 PM PGY-3 [place]

## 2020-01-20 NOTE — Telephone Encounter (Signed)
Left detailed message notifying ok to accompany to Pt's appt.  Updated appt notes.

## 2020-01-21 DIAGNOSIS — M109 Gout, unspecified: Secondary | ICD-10-CM | POA: Insufficient documentation

## 2020-01-21 LAB — CBC WITH DIFFERENTIAL/PLATELET
Basophils Absolute: 0.1 10*3/uL (ref 0.0–0.2)
Basos: 1 %
EOS (ABSOLUTE): 0.3 10*3/uL (ref 0.0–0.4)
Eos: 4 %
Hematocrit: 35.2 % — ABNORMAL LOW (ref 37.5–51.0)
Hemoglobin: 11.8 g/dL — ABNORMAL LOW (ref 13.0–17.7)
Immature Grans (Abs): 0.1 10*3/uL (ref 0.0–0.1)
Immature Granulocytes: 2 %
Lymphocytes Absolute: 0.7 10*3/uL (ref 0.7–3.1)
Lymphs: 9 %
MCH: 30.6 pg (ref 26.6–33.0)
MCHC: 33.5 g/dL (ref 31.5–35.7)
MCV: 91 fL (ref 79–97)
Monocytes Absolute: 0.7 10*3/uL (ref 0.1–0.9)
Monocytes: 9 %
Neutrophils Absolute: 5.7 10*3/uL (ref 1.4–7.0)
Neutrophils: 75 %
Platelets: 342 10*3/uL (ref 150–450)
RBC: 3.85 x10E6/uL — ABNORMAL LOW (ref 4.14–5.80)
RDW: 15.2 % (ref 11.6–15.4)
WBC: 7.5 10*3/uL (ref 3.4–10.8)

## 2020-01-21 LAB — BASIC METABOLIC PANEL
BUN/Creatinine Ratio: 12 (ref 10–24)
BUN: 25 mg/dL (ref 8–27)
CO2: 23 mmol/L (ref 20–29)
Calcium: 10.1 mg/dL (ref 8.6–10.2)
Chloride: 100 mmol/L (ref 96–106)
Creatinine, Ser: 2.01 mg/dL — ABNORMAL HIGH (ref 0.76–1.27)
GFR calc Af Amer: 37 mL/min/{1.73_m2} — ABNORMAL LOW (ref >59)
GFR calc non Af Amer: 32 mL/min/{1.73_m2} — ABNORMAL LOW (ref >59)
Glucose: 98 mg/dL (ref 65–99)
Potassium: 4.7 mmol/L (ref 3.5–5.2)
Sodium: 138 mmol/L (ref 134–144)

## 2020-01-21 LAB — URIC ACID: Uric Acid: 9.2 mg/dL — ABNORMAL HIGH (ref 3.8–8.4)

## 2020-01-21 NOTE — Assessment & Plan Note (Signed)
Controlled, stable. - Repeating BMP today

## 2020-01-21 NOTE — Assessment & Plan Note (Signed)
Patient has gout flare for first time. Most likely due to chronic kidney disease. Uric acid elevated. I did consider cellulitis so got a CBC to check for leukocytosis but considering his history and exam I had very low suspicion for cellulitis. - Due to GFR around 30 will avoid NSAIDS - Prednisone 50mg  for 4 days, then taper down by 10mg  every two days, then stop. - F/u if no improvement.

## 2020-01-23 ENCOUNTER — Encounter: Payer: Self-pay | Admitting: Internal Medicine

## 2020-01-23 ENCOUNTER — Ambulatory Visit (INDEPENDENT_AMBULATORY_CARE_PROVIDER_SITE_OTHER): Payer: HMO | Admitting: Internal Medicine

## 2020-01-23 ENCOUNTER — Other Ambulatory Visit: Payer: Self-pay

## 2020-01-23 VITALS — BP 138/66 | HR 66 | Ht 70.0 in | Wt 183.2 lb

## 2020-01-23 DIAGNOSIS — I495 Sick sinus syndrome: Secondary | ICD-10-CM | POA: Diagnosis not present

## 2020-01-23 DIAGNOSIS — I480 Paroxysmal atrial fibrillation: Secondary | ICD-10-CM | POA: Diagnosis not present

## 2020-01-23 DIAGNOSIS — Z95810 Presence of automatic (implantable) cardiac defibrillator: Secondary | ICD-10-CM | POA: Diagnosis not present

## 2020-01-23 NOTE — Progress Notes (Signed)
HPI [name] returns today for followup. 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 He feels well except for some dizziness. No chest pain or sob. No edema. He has easy bruisability in his arms. He is on systemic anti-coagulation and amio for atrial fib. He has sinus node dysfunction. He has had no more VT on 1.2 grams of amiodarone daily. Allergies  Allergen Reactions  . Ipratropium Other (See Comments)  . Morphine And Related Nausea Only  . Vicodin [Hydrocodone-Acetaminophen] Other (See Comments)    Hallucinations   . Penicillins Rash     Current Outpatient Medications  Medication Sig Dispense Refill  . albuterol (ACCUNEB) 0.63 MG/3ML nebulizer solution 3 ml inhale orally via nebulizer four times a day    . albuterol (PROVENTIL HFA;VENTOLIN HFA) 108 (90 Base) MCG/ACT inhaler Inhale 2 puffs into the lungs every 6 (six) hours as needed.     Marland Kitchen amiodarone (PACERONE) 200 MG tablet Take one tablet by mouth daily Monday through Saturday.  DO NOT take on Sunday. 90 tablet 2  . apixaban (ELIQUIS) 5 MG TABS tablet Take 1 tablet (5 mg total) by mouth 2 (two) times daily. 180 tablet 1  . ascorbic acid (VITAMIN C) 500 MG tablet Take 500 mg by mouth daily.    Marland Kitchen atorvastatin (LIPITOR) 40 MG tablet Take 1 tablet (40 mg total) by mouth daily. 90 tablet 3  . cholecalciferol (VITAMIN D) 1000 units tablet Take 1,000 Units by mouth daily.    . Cyanocobalamin (VITAMIN B 12 PO) Take by mouth daily.    . ferrous sulfate 325 (65 FE) MG tablet Take 325 mg by mouth daily with breakfast.    . Fluticasone-Salmeterol (ADVAIR) 250-50 MCG/DOSE AEPB Inhale 1 puff into the lungs 2 (two) times daily.    . folic acid (FOLVITE) A999333 MCG tablet Take 400 mcg by mouth daily.    . furosemide (LASIX) 40 MG tablet Take 40 mg by mouth 2 (two) times daily.    . hydrALAZINE (APRESOLINE) 25 MG tablet Take 1 tablet (25 mg total) by mouth 2 (two) times daily. 180 tablet 3  . labetalol (NORMODYNE) 300 MG  tablet Take 1 tablet by mouth 2 (two) times a day.    . Multiple Vitamin (MULTIVITAMIN) tablet Take 1 tablet by mouth daily.    . nitroGLYCERIN (NITROSTAT) 0.4 MG SL tablet Place 0.4 mg under the tongue every 5 (five) minutes as needed for chest pain.    . predniSONE (DELTASONE) 50 MG tablet Take one tablet for four days 4 tablet 0  . predniSONE (STERAPRED UNI-PAK 21 TAB) 10 MG (21) TBPK tablet Please take 4 tablets for two days, then 3 tablets for two days, then two tablets for two day, then one tablet for three days 21 tablet 1  . sodium chloride (MURO 128) 2 % ophthalmic solution Place 1 drop into both eyes 3 (three) times daily.    Marland Kitchen tiotropium (SPIRIVA HANDIHALER) 18 MCG inhalation capsule Place 18 mcg into inhaler and inhale daily.    Marland Kitchen torsemide (DEMADEX) 20 MG tablet Take 40 mg by mouth daily.     No current facility-administered medications for this visit.     Past Medical History:  Diagnosis Date  . AF (paroxysmal atrial fibrillation) ([place])   . AICD (automatic cardioverter/defibrillator) present   . CAD (coronary artery disease)   . Cardiomyopathy, ischemic   . Carotid artery occlusion   . Chronic kidney disease   .  COPD (chronic obstructive pulmonary disease) ([place])   . Dementia without behavioral disturbance ([place])   . GERD (gastroesophageal reflux disease)   . Hyperlipidemia   . Hypertension   . Pneumonia   . Sleep apnea   . Stroke ([place])   . TIA (transient ischemic attack)     ROS:   All systems reviewed and negative except as noted in the HPI.   Past Surgical History:  Procedure Laterality Date  . CARDIAC PACEMAKER PLACEMENT    . CATARACT EXTRACTION    . CORONARY ARTERY BYPASS GRAFT    . CORONARY STENT PLACEMENT    . EYE SURGERY    . ICD IMPLANT N/A 12/29/2017   Procedure: ICD IMPLANT;  Surgeon: Evans Lance, MD;  Location: [facility];  Service: Cardiovascular;  Laterality: N/A;  . LEFT HEART CATH AND CORS/GRAFTS ANGIOGRAPHY N/A 12/29/2017    Procedure: LEFT HEART CATH AND CORS/GRAFTS ANGIOGRAPHY;  Surgeon: Martinique, Peter M, MD;  Location: [facility];  Service: Cardiovascular;  Laterality: N/A;     Family History  Problem Relation Age of Onset  . Hypertension Mother   . Heart disease Mother   . Hypertension Sister   . Heart disease Sister   . Thyroid disease Sister   . Thyroid disease Brother        hyperthyroid     Social History   Socioeconomic History  . Marital status: Married    Spouse name: Not on file  . Number of children: Not on file  . Years of education: Not on file  . Highest education level: Not on file  Occupational History  . Not on file  Tobacco Use  . Smoking status: Former Smoker    Types: Cigarettes    Quit date: 11/30/2012    Years since quitting: 7.1  . Smokeless tobacco: Never Used  Substance and Sexual Activity  . Alcohol use: No  . Drug use: No  . Sexual activity: Not on file  Other Topics Concern  . Not on file  Social History Narrative  . Not on file   Social Determinants of Health   Financial Resource Strain:   . Difficulty of Paying Living Expenses: Not on file  Food Insecurity:   . Worried About Charity fundraiser in the Last Year: Not on file  . Ran Out of Food in the Last Year: Not on file  Transportation Needs:   . Lack of Transportation (Medical): Not on file  . Lack of Transportation (Non-Medical): Not on file  Physical Activity:   . Days of Exercise per Week: Not on file  . Minutes of Exercise per Session: Not on file  Stress:   . Feeling of Stress : Not on file  Social Connections:   . Frequency of Communication with Friends and Family: Not on file  . Frequency of Social Gatherings with Friends and Family: Not on file  . Attends Religious Services: Not on file  . Active Member of Clubs or Organizations: Not on file  . Attends Archivist Meetings: Not on file  . Marital Status: Not on file  Intimate Partner Violence:   . Fear of Current or  Ex-Partner: Not on file  . Emotionally Abused: Not on file  . Physically Abused: Not on file  . Sexually Abused: Not on file     BP 138/66   Pulse 66   Ht 5\' 10"  (1.778 m)   Wt 183 lb 3.2 oz (83.1 kg)  SpO2 98%   BMI 26.29 kg/m   Physical Exam:  Well appearing NAD HEENT: Unremarkable Neck:  No JVD, no thyromegally Lymphatics:  No adenopathy Back:  No CVA tenderness Lungs:  Clear with no wheezes HEART:  Regular rate rhythm, no murmurs, no rubs, no clicks Abd:  soft, positive bowel sounds, no organomegally, no rebound, no guarding Ext:  2 plus pulses, no edema, no cyanosis, no clubbing Skin:  No rashes no nodules Neuro:  CN II through XII intact, motor grossly intact  EKG nsr with atrial pacing  DEVICE  Normal device function.  See PaceArt for details.   Assess/Plan: 1. VT - he is maintaining NSR on amiodarone. We will continue. 2. PAF -he is maintaining NSR on amiodarone.  3. Coags - he has had no bleeding on eliquis. 4. ICD - his [employer] DDD ICD is working normally. We will recheck in several months.  Mikle Bosworth.D.

## 2020-01-23 NOTE — Patient Instructions (Signed)
Medication Instructions:  Your physician recommends that you continue on your current medications as directed. Please refer to the Current Medication list given to you today.  Labwork: None ordered.  Testing/Procedures: None ordered.  Follow-Up: Your physician wants you to follow-up in: one year with Dr. Lovena Le.   You will receive a reminder letter in the mail two months in advance. If you don't receive a letter, please call our office to schedule the follow-up appointment.  Remote monitoring is used to monitor your ICD from home. This monitoring reduces the number of office visits required to check your device to one time per year. It allows [place] to keep an eye on the functioning of your device to ensure it is working properly. You are scheduled for a device check from home on 04/17/2020. You may send your transmission at any time that day. If you have a wireless device, the transmission will be sent automatically. After your physician reviews your transmission, you will receive a postcard with your next transmission date.  Any Other Special Instructions Will Be Listed Below (If Applicable).  If you need a refill on your cardiac medications before your next appointment, please call your pharmacy.

## 2020-02-03 ENCOUNTER — Telehealth: Payer: Self-pay | Admitting: Family Medicine

## 2020-02-03 ENCOUNTER — Telehealth: Payer: Self-pay | Admitting: Internal Medicine

## 2020-02-03 NOTE — Telephone Encounter (Signed)
Pt wife calling because pt is scheduled for COVID vaccine on Saturday. The people who scheduled the shot are asking the pt to call and make sure it's ok for him to get the shot because he is on blood thinners. Please call [phone] and let pt know if it's ok or not. Ottis Stain, CMA

## 2020-02-03 NOTE — Telephone Encounter (Signed)
I spoke with Joe Wiggins wife and let her know that it is perfectly safe for him to receive the Covid vaccine while on blood thinners, although he may have some bleeding and bruising at the injection site.  This can be ameliorated with holding additional pressure to the area afterward.  Due to his multiple medical conditions, it is even more important that he receive the Covid vaccine to prevent a Covid infection.  She was agreeable with this plan.

## 2020-02-03 NOTE — Telephone Encounter (Signed)
We are recommending the COVID-19 vaccine to all of our patients. Cardiac medications (including blood thinners) should not deter anyone from being vaccinated and there is no need to hold any of those medications prior to vaccine administration.     Currently, there is a hotline to call (active 01/03/20) to schedule vaccination appointments as no walk-ins will be accepted.   Number: [phone].    If an appointment is not available please go to [email or web address] to sign up for notification when additional vaccine appointments are available.   If you have further questions or concerns about the vaccine process, please visit [email or web address] or contact your primary care physician.   

## 2020-02-06 ENCOUNTER — Telehealth: Payer: Self-pay | Admitting: Internal Medicine

## 2020-02-06 DIAGNOSIS — I119 Hypertensive heart disease without heart failure: Secondary | ICD-10-CM

## 2020-02-06 NOTE — Telephone Encounter (Signed)
  Pt c/o swelling: STAT is pt has developed SOB within 24 hours  1) How much weight have you gained and in what time span? from 172 to 173 in 7 days  2) If swelling, where is the swelling located? ankles  3) Are you currently taking a fluid pill? yes  4) Are you currently SOB? No   5) Do you have a log of your daily weights (if so, list)? 174.9 yesterday, 173 this morning   6) Have you gained 3 pounds in a day or 5 pounds in a week? no  7) Have you traveled recently? no  Patient's ankles have been swelling since his pacemaker was adjusted 1/28. Patient has also been having SOB when walking. Please advise.

## 2020-02-07 NOTE — Telephone Encounter (Signed)
Per device clinic-only change made to Pt's device was to turn down base rate to 50.  Would not be causing fluid overload.  Per Dr. Lovena Le- have Pt increase his fluid pill for 3 days.  Advised Pt's wife that device change is not causing Pt's edema.  Advised to increase Pt's torsemide for 3 days-40 mg in AM and 20 mg in PM  Will return call on Monday to see if this has helped with edema.

## 2020-02-10 MED ORDER — TORSEMIDE 20 MG PO TABS: ORAL_TABLET | ORAL | 3 refills | Status: DC

## 2020-02-10 NOTE — Telephone Encounter (Signed)
Returned call to Pt's wife.  Per wife Pt's edema/sob have improved on increased torsemide.  Per Dr. Sherron Ales Pt continue torsemide 20 mg 2 tabs in the am 1 tab in the pm.  Will have Pt get bmp in 2 weeks.  Pt to come [facility] [address] [facility] for repeat bmp

## 2020-02-11 ENCOUNTER — Other Ambulatory Visit: Payer: HMO

## 2020-02-14 ENCOUNTER — Other Ambulatory Visit: Payer: Self-pay | Admitting: Internal Medicine

## 2020-02-14 MED ORDER — ATORVASTATIN CALCIUM 40 MG PO TABS: 40.0000 mg | ORAL_TABLET | Freq: Every day | ORAL | 3 refills | Status: DC

## 2020-02-24 ENCOUNTER — Other Ambulatory Visit: Payer: Self-pay

## 2020-02-24 ENCOUNTER — Other Ambulatory Visit: Payer: HMO | Admitting: *Deleted

## 2020-02-24 DIAGNOSIS — I119 Hypertensive heart disease without heart failure: Secondary | ICD-10-CM | POA: Diagnosis not present

## 2020-02-24 LAB — BASIC METABOLIC PANEL
BUN/Creatinine Ratio: 11 (ref 10–24)
BUN: 25 mg/dL (ref 8–27)
CO2: 26 mmol/L (ref 20–29)
Calcium: 9.9 mg/dL (ref 8.6–10.2)
Chloride: 100 mmol/L (ref 96–106)
Creatinine, Ser: 2.18 mg/dL — ABNORMAL HIGH (ref 0.76–1.27)
GFR calc Af Amer: 33 mL/min/{1.73_m2} — ABNORMAL LOW (ref >59)
GFR calc non Af Amer: 29 mL/min/{1.73_m2} — ABNORMAL LOW (ref >59)
Glucose: 95 mg/dL (ref 65–99)
Potassium: 4.1 mmol/L (ref 3.5–5.2)
Sodium: 138 mmol/L (ref 134–144)

## 2020-03-04 DIAGNOSIS — H18003 Unspecified corneal deposit, bilateral: Secondary | ICD-10-CM | POA: Diagnosis not present

## 2020-03-04 DIAGNOSIS — Z961 Presence of intraocular lens: Secondary | ICD-10-CM | POA: Diagnosis not present

## 2020-03-04 DIAGNOSIS — H18519 Endothelial corneal dystrophy, unspecified eye: Secondary | ICD-10-CM | POA: Diagnosis not present

## 2020-03-04 DIAGNOSIS — H26493 Other secondary cataract, bilateral: Secondary | ICD-10-CM | POA: Diagnosis not present

## 2020-03-12 ENCOUNTER — Other Ambulatory Visit: Payer: Self-pay | Admitting: Pharmacist

## 2020-03-12 ENCOUNTER — Ambulatory Visit: Payer: Self-pay | Admitting: Pharmacist

## 2020-03-12 NOTE — Patient Outreach (Signed)
[facility]) Care Management  03/12/2020  Colonel Krauser [birth year] [id number]   Patient's pharmacy case is being closed. Repeated attempts to contact the patient and the patient is not on the all payer's list.  Plan: Close patient's case.  Elayne Guerin, PharmD, [place] Clinical Pharmacist [phone]

## 2020-03-17 ENCOUNTER — Telehealth (INDEPENDENT_AMBULATORY_CARE_PROVIDER_SITE_OTHER): Payer: HMO | Admitting: Family Medicine

## 2020-03-17 VITALS — Temp 99.0°F | Wt 183.0 lb

## 2020-03-17 DIAGNOSIS — J0110 Acute frontal sinusitis, unspecified: Secondary | ICD-10-CM

## 2020-03-17 NOTE — Progress Notes (Signed)
[place] Telemedicine Visit  Patient consented to have virtual visit. Method of visit: Telephone  Encounter participants: Patient: Joe Wiggins - located at home Provider: Benay Pike - located at [facility] Others (if applicable): Wife  Chief Complaint: Headache, stuffy nose  HPI: States he is hurting behind his eyes.  When asked about severity he states it is 'not bad'.  Lasts 5-10 minutes.  Happens twice a day since yesterday.  Has a stuffy nose.  Also had some nose bleeds that started last night. Is taking eliquis.  Takes 5-10 minutes to stop the nose bleed.    ROS: per HPI  Pertinent PMHx: COPD,  Exam:  Respiratory: Speaking in full sentences, no respiratory distress  Assessment/Plan:  Acute frontal sinusitis Symptoms including nasal congestion and pain behind his eyes since yesterday.  His only occurred twice and lasting only 5 to 10 minutes.  He developed some nosebleeds from blowing his nose which resolved in less than 10 minutes.  Likely viral sinusitis, but patient also has a history of allergies which could be the cause.  Advised patient for symptomatic treatment and to schedule another appointment if this not resolve in the next week.  Advised him to seek medical attention if his nosebleeding does not resolve after 10 minutes if he gets another episode, given that he is on Eliquis..    Time spent during visit with patient: 16 minutes

## 2020-03-19 DIAGNOSIS — J0110 Acute frontal sinusitis, unspecified: Secondary | ICD-10-CM | POA: Insufficient documentation

## 2020-03-19 NOTE — Assessment & Plan Note (Signed)
Symptoms including nasal congestion and pain behind his eyes since yesterday.  His only occurred twice and lasting only 5 to 10 minutes.  He developed some nosebleeds from blowing his nose which resolved in less than 10 minutes.  Likely viral sinusitis, but patient also has a history of allergies which could be the cause.  Advised patient for symptomatic treatment and to schedule another appointment if this not resolve in the next week.  Advised him to seek medical attention if his nosebleeding does not resolve after 10 minutes if he gets another episode, given that he is on Eliquis.Marland Kitchen

## 2020-03-20 ENCOUNTER — Telehealth: Payer: Self-pay

## 2020-03-20 NOTE — Telephone Encounter (Signed)
[name], patient's wife, calls nurse line regarding productive cough.   Attempted to call patient back, no answer. Left HIPPA compliant message for patient to return phone call to office with questions/concerns.   To PCP  Talbot Grumbling, RN

## 2020-03-23 NOTE — Telephone Encounter (Signed)
Returned Ms. Lichtenwalner call and made an appointment for Mr. Dowland with me in access to care for Thursday at 1:30.  If he is feeling better by then, she can cancel that appointment.  He currently does not have any red flag symptoms for severe pneumonia.  Counseled on red flag symptoms to look for and that cough may linger for several weeks to over 1 month.

## 2020-03-26 ENCOUNTER — Ambulatory Visit: Payer: HMO

## 2020-04-02 DIAGNOSIS — K219 Gastro-esophageal reflux disease without esophagitis: Secondary | ICD-10-CM | POA: Diagnosis not present

## 2020-04-02 DIAGNOSIS — J948 Other specified pleural conditions: Secondary | ICD-10-CM | POA: Diagnosis not present

## 2020-04-02 DIAGNOSIS — G4733 Obstructive sleep apnea (adult) (pediatric): Secondary | ICD-10-CM | POA: Diagnosis not present

## 2020-04-02 DIAGNOSIS — Z9989 Dependence on other enabling machines and devices: Secondary | ICD-10-CM | POA: Diagnosis not present

## 2020-04-02 DIAGNOSIS — J440 Chronic obstructive pulmonary disease with acute lower respiratory infection: Secondary | ICD-10-CM | POA: Diagnosis not present

## 2020-04-17 ENCOUNTER — Ambulatory Visit (INDEPENDENT_AMBULATORY_CARE_PROVIDER_SITE_OTHER): Payer: HMO | Admitting: *Deleted

## 2020-04-17 DIAGNOSIS — I469 Cardiac arrest, cause unspecified: Secondary | ICD-10-CM | POA: Diagnosis not present

## 2020-04-17 LAB — CUP PACEART REMOTE DEVICE CHECK
Battery Remaining Longevity: 58 mo
Battery Remaining Percentage: 67 %
Battery Voltage: 2.95 V
Brady Statistic AP VP Percent: 1 %
Brady Statistic AP VS Percent: 42 %
Brady Statistic AS VP Percent: 1 %
Brady Statistic AS VS Percent: 57 %
Brady Statistic RA Percent Paced: 42 %
Brady Statistic RV Percent Paced: 1 %
Date Time Interrogation Session: 20210423092737
HighPow Impedance: 68 Ohm
HighPow Impedance: 68 Ohm
Implantable Lead Implant Date: 20140217
Implantable Lead Implant Date: 20190104
Implantable Lead Location: 753859
Implantable Lead Location: 753860
Implantable Lead Model: 7122
Implantable Pulse Generator Implant Date: 20190104
Lead Channel Impedance Value: 330 Ohm
Lead Channel Impedance Value: 510 Ohm
Lead Channel Pacing Threshold Amplitude: 0.75 V
Lead Channel Pacing Threshold Amplitude: 1 V
Lead Channel Pacing Threshold Pulse Width: 0.5 ms
Lead Channel Pacing Threshold Pulse Width: 0.8 ms
Lead Channel Sensing Intrinsic Amplitude: 0.7 mV
Lead Channel Sensing Intrinsic Amplitude: 12 mV
Lead Channel Setting Pacing Amplitude: 2.5 V
Lead Channel Setting Pacing Amplitude: 2.5 V
Lead Channel Setting Pacing Pulse Width: 0.5 ms
Lead Channel Setting Sensing Sensitivity: 0.5 mV
Pulse Gen Serial Number: 9777027

## 2020-04-17 NOTE — Progress Notes (Signed)
ICD Remote  

## 2020-04-28 ENCOUNTER — Other Ambulatory Visit: Payer: Self-pay

## 2020-04-28 ENCOUNTER — Ambulatory Visit (INDEPENDENT_AMBULATORY_CARE_PROVIDER_SITE_OTHER): Payer: HMO | Admitting: Family Medicine

## 2020-04-28 VITALS — BP 122/60 | HR 66 | Ht 70.0 in | Wt 177.8 lb

## 2020-04-28 DIAGNOSIS — I10 Essential (primary) hypertension: Secondary | ICD-10-CM

## 2020-04-28 DIAGNOSIS — R42 Dizziness and giddiness: Secondary | ICD-10-CM

## 2020-04-28 MED ORDER — HYDRALAZINE HCL 25 MG PO TABS: 12.5000 mg | ORAL_TABLET | Freq: Two times a day (BID) | ORAL | 3 refills | Status: DC

## 2020-04-28 NOTE — Progress Notes (Signed)
    SUBJECTIVE:   CHIEF COMPLAINT / HPI:   Dizziness Presents with his wife Feels "swimmy-headed" and thinks this has been going on for months Feels it when he goes from lying to standing Usually lats 30 min to an hour Comes and goes during the day Gets off balanced sometimes when walking Never happens when he is just sitting, does happen when he goes from sitting to standing No recent falls, no head trauma, no SOB, sometimes DOE but is chronic, no chest pain, no syncope, no changes in vision associated with the nausea, no fevers, no recent illness, wife reports decreased hearing that is chronic, but this is not new Usually when he complains of dizziness, his wife helps him sit and it will get better in 5-10 minutes Drinks about 1-2 glasses of water a day, he drinks kool-aid or pepsi Takes torsemide, 1 QAM and 2QHS which makes him pee a lot Also takes hydralazine 25mg  BID Checks BP at home, 120s/60s, sometimes goes up to 140s/60s, but wife reports Bps have been good Last saw cardiologist 3-4 months ago, no recent shocks from defibrillator, thinks that next appointment is in 1 month or so Has seen Dr. Shan Levans for the same in the past 01/2018, but wife thinks it is worsening again   PERTINENT  PMH / PSH: Bilateral carotid artery stenosis, PAF on Eliquis, CAD, history of cardiac arrest with ICD, history of orthostatic hypotension, COPD, dementia, CKD, HLD  OBJECTIVE:   BP 122/60 (BP Location: Right Arm, Patient Position: Standing, Cuff Size: Normal)   Pulse 66   Ht 5\' 10"  (1.778 m)   Wt 177 lb 12.8 oz (80.6 kg)   SpO2 96%   BMI 25.51 kg/m    Physical Exam:  General: 74 y.o. male in NAD HEENT: NCAT, EOMI, no nystagmus Cardio: RRR no m/r/g Lungs: CTAB, no IWOB on RA Skin: warm and dry Extremities: Trace BLE edema Neuro: CN II-XII grossly intact, 5/5 strength BUE/BLE, sensation intact throughout, negative finger-to-nose, negative heel-to-shin  Orthostatic Vitals: Lying 152/62,  HR 64 Sitting 148/68, HR 66 Standing 122/60   ASSESSMENT/PLAN:   Dizziness No history of syncope or presyncope.  Orthostatic vital signs positive.  Patient has a history of orthostatic hypotension.  Overall seems to be euvolemic on exam, suspect blood pressure is being overcorrected.  Nurse reported significant reproduction of symptoms while obtaining orthostatic vitals.  Patient's history of symptoms also seems to support that of orthostatic hypotension.  Does not seem to be exhibiting complaints that correspond with vertigo.  He has not had any recent falls, neurologically intact, cerebellar testing within normal limits, therefore doubt stroke or intracerebral hemorrhage.  Discussed with patient taking time when going from sitting to standing.  Also decreased hydralazine from 25 mg twice daily to 12.5 mg twice daily to hopefully help.  We will also check a BMP, CBC, TSH given that patient is on amiodarone.  Follow-up in 2 to 4 weeks with PCP or sooner as needed.  Hypertension Suspect that patient's blood pressure is overcorrected, especially given low diastolics.  Will decrease hydralazine from 25 mg twice daily to 12.5 mg twice daily and continue to monitor.  Patient to return in 2 to 4 weeks to see PCP.     Cleophas Dunker, [place]

## 2020-04-28 NOTE — Assessment & Plan Note (Signed)
No history of syncope or presyncope.  Orthostatic vital signs positive.  Patient has a history of orthostatic hypotension.  Overall seems to be euvolemic on exam, suspect blood pressure is being overcorrected.  Nurse reported significant reproduction of symptoms while obtaining orthostatic vitals.  Patient's history of symptoms also seems to support that of orthostatic hypotension.  Does not seem to be exhibiting complaints that correspond with vertigo.  He has not had any recent falls, neurologically intact, cerebellar testing within normal limits, therefore doubt stroke or intracerebral hemorrhage.  Discussed with patient taking time when going from sitting to standing.  Also decreased hydralazine from 25 mg twice daily to 12.5 mg twice daily to hopefully help.  We will also check a BMP, CBC, TSH given that patient is on amiodarone.  Follow-up in 2 to 4 weeks with PCP or sooner as needed.

## 2020-04-28 NOTE — Assessment & Plan Note (Signed)
Suspect that patient's blood pressure is overcorrected, especially given low diastolics.  Will decrease hydralazine from 25 mg twice daily to 12.5 mg twice daily and continue to monitor.  Patient to return in 2 to 4 weeks to see PCP.

## 2020-04-28 NOTE — Patient Instructions (Signed)
Thank you for coming to see me today. It was a pleasure. Today we talked about:   Your dizziness:  I will decrease your blood pressure medicine.  Try to work on drinking water throughout the day.  Take your time when going from sitting to standing.  We will get some blood work today and if it is abnormal I will call you.    Please follow-up with your PCP in 2-4 weeks.  If you have any questions or concerns, please do not hesitate to call the office at [phone].  Best,   [place] Constable, DO

## 2020-05-05 ENCOUNTER — Ambulatory Visit: Payer: HMO

## 2020-05-13 ENCOUNTER — Other Ambulatory Visit: Payer: Self-pay

## 2020-05-13 MED ORDER — APIXABAN 5 MG PO TABS: 5.0000 mg | ORAL_TABLET | Freq: Two times a day (BID) | ORAL | 1 refills | Status: DC

## 2020-05-13 NOTE — Telephone Encounter (Signed)
Received faxed refill request for Eliquis from [facility] mail order pharmacy.  Pt last saw Dr Lovena Le 01/23/20, last labs 02/24/20 Creat 2.18, age 74, weight 80.6kg, based on specified criteria pt is on appropriate dosage of Eliquis 5mg  BID.  Will refill rx.

## 2020-05-21 ENCOUNTER — Ambulatory Visit: Payer: Self-pay

## 2020-05-26 ENCOUNTER — Ambulatory Visit (INDEPENDENT_AMBULATORY_CARE_PROVIDER_SITE_OTHER): Payer: HMO | Admitting: Family Medicine

## 2020-05-26 ENCOUNTER — Other Ambulatory Visit: Payer: Self-pay

## 2020-05-26 ENCOUNTER — Encounter: Payer: Self-pay | Admitting: Family Medicine

## 2020-05-26 VITALS — BP 140/68 | HR 56 | Ht 70.0 in | Wt 179.4 lb

## 2020-05-26 DIAGNOSIS — I951 Orthostatic hypotension: Secondary | ICD-10-CM

## 2020-05-26 DIAGNOSIS — I872 Venous insufficiency (chronic) (peripheral): Secondary | ICD-10-CM | POA: Diagnosis not present

## 2020-05-26 NOTE — Progress Notes (Signed)
    SUBJECTIVE:   CHIEF COMPLAINT / HPI:   Follow-up orthostatic hypotension Patient reports that he has had no more symptoms of bothersome orthostatic hypotension since his hydralazine dose was reduced at his last visit.  He is continuing to work on rising from a seated position slowly.  Bilateral ankle swelling He and his wife report that he has had bilateral ankle swelling that is worse at the end of the day and better when he wakes up in the morning for quite some time.  He has had no history of injury or trauma to the ankles.  He continues to try to walk daily.  His wife reports that he does like to eat Cheetos frequently, but otherwise they try to avoid salt.  He has had no worsening shortness of breath or orthopnea.  PERTINENT  PMH / PSH: Atrial fibrillation, sinus node dysfunction, hypertension, CAD, ICD in place, physical deconditioning, vascular dementia  OBJECTIVE:   BP 140/68   Pulse (!) 56   Ht 5\' 10"  (1.778 m)   Wt 179 lb 6.4 oz (81.4 kg)   SpO2 99%   BMI 25.74 kg/m   General: well appearing, appears stated age, pleasant Cardiac: Slightly bradycardic, regular rhythm, no murmurs, pitting edema to the knees bilaterally Respiratory: CTAB, no rhonchi, rales, or wheezing, normal work of breathing Skin: Ecchymoses on hands, no rashes Psych: appropriate mood and affect    ASSESSMENT/PLAN:   Orthostatic hypotension Improved currently due to dose reduction of hydralazine.  We will continue current medications.  May be able to decrease more of his blood pressure medications in the future if he continues to have these symptoms.  Discussed staying well-hydrated and continuing to rise slowly from a seated position.  Venous insufficiency Patient's ankle swelling is due to venous insufficiency.  There may also be an element of edema from heart failure given his extensive heart disease history and EF of 40 to 45% on echo in 2019.  Reviewed the physiology of venous insufficiency  with the patient and his wife and discussed how avoidance of salt and the use of compression stockings helps to improve this condition.  Encouraged him to get measured for compression stockings and to wear them daily, discussed places they can go to have this done.     Kathrene Alu, MD [place]

## 2020-05-26 NOTE — Patient Instructions (Signed)
It was nice seeing you today Joe Wiggins!  The ankle swelling that you have is due to fluid that is leaking out of your veins.  It will be important to avoid salt but also to wear compression stockings since these can push the fluid back into your veins.  Please call our clinic if you become dizzy again.  We can possibly decrease your blood pressure medication further if needed.  It was a pleasure taking care of you.  I hope that you continue to do well.  If you have any questions or concerns, please feel free to call the clinic.   Be well,  Dr. Shan Levans

## 2020-05-27 ENCOUNTER — Encounter: Payer: Self-pay | Admitting: Family Medicine

## 2020-05-27 DIAGNOSIS — D649 Anemia, unspecified: Secondary | ICD-10-CM | POA: Diagnosis not present

## 2020-05-27 DIAGNOSIS — I872 Venous insufficiency (chronic) (peripheral): Secondary | ICD-10-CM | POA: Insufficient documentation

## 2020-05-27 DIAGNOSIS — N184 Chronic kidney disease, stage 4 (severe): Secondary | ICD-10-CM | POA: Diagnosis not present

## 2020-05-27 DIAGNOSIS — I129 Hypertensive chronic kidney disease with stage 1 through stage 4 chronic kidney disease, or unspecified chronic kidney disease: Secondary | ICD-10-CM | POA: Diagnosis not present

## 2020-05-27 NOTE — Assessment & Plan Note (Addendum)
Patient's ankle swelling is due to venous insufficiency.  There may also be an element of edema from heart failure given his extensive heart disease history and EF of 40 to 45% on echo in 2019.  Reviewed the physiology of venous insufficiency with the patient and his wife and discussed how avoidance of salt and the use of compression stockings helps to improve this condition.  Encouraged him to get measured for compression stockings and to wear them daily, discussed places they can go to have this done.

## 2020-05-27 NOTE — Assessment & Plan Note (Signed)
Improved currently due to dose reduction of hydralazine.  We will continue current medications.  May be able to decrease more of his blood pressure medications in the future if he continues to have these symptoms.  Discussed staying well-hydrated and continuing to rise slowly from a seated position.

## 2020-06-03 DIAGNOSIS — G4733 Obstructive sleep apnea (adult) (pediatric): Secondary | ICD-10-CM | POA: Diagnosis not present

## 2020-06-15 ENCOUNTER — Other Ambulatory Visit: Payer: Self-pay

## 2020-06-15 NOTE — Patient Outreach (Signed)
[facility] [facility]) Care Management Chronic Special Needs Program  06/15/2020  Name: Joe Wiggins DOB: November 29, 1946  MRN: [id number]  [name] is enrolled in a chronic special needs plan for Heart Failure. Chronic Care Management Coordinator telephoned client to follow up, review health risk assessment and to update individualized care plan.  RNCM reviewed the chronic care management program, importance of client participation, and taking their care plan to all provider appointments and inpatient facilities.    Subjective: RNCM spoke with client's approval to speak with wife, Joe Wiggins. Two client identifiers confirmed. Mrs. Caggiano reports client with history of heart failure, hypertension, atrial fibrillation, heart disease, COPD, heart attack, stroke, and memory loss (dementia). She reports she assist client with medication management and transportation. Client is able to perform Activities of daily living. However she assist with IADL's.  She denies any issues or concerns at this time.  Goals Addressed            This Visit's Progress   . Client verbalize knowledge of Heart Failure disease self management skills    On track    Discussed signs/symptoms of heart failure exacerbation. Mrs. Fiallos voiced understanding. Review [employer] calendar sent in the mail for heart failure information. Follow a low salt meal plan, read food labels for the sodium (salt) content. Continue to weight and record weights daily. Continue to take medications as prescribed. Follow provider  instructions for recommended fluid intake amounts.      . Client will not report change from baseline and no repeated symptoms of stroke with in the next 6-9 months.    On track    Discussed stroke history. Know signs and symptoms of stroke: Facial drooping, arm weakness or numb on one side, speech is difficult (slurred, unable to speak). Emmi education provided, "Lowering the risk of having another  stroke". Review and plan to discuss at next telephonic assessment.     . Client will report no worsening of symptoms of Atrial Fibrillation within the next 6-9 months   On track    Review [employer] calendar sent in the mail for A-fib information and action plan. Call your provider if you have a racing or irregular heartbeat that my be uncomfortable, shortness of breath with or without chest pain, weakness and dizziness. Follow up with your Cardiologist(heart doctor) for yearly visits. Emmi education on "heart failure and atrial fibrillation". Review and plan to discuss at next telephonic assessment.    . Client will verbalize knowledge of chronic lung disease as evidenced by no ED visits or Inpatient stays related to chronic lung disease    On track    No ED visits or inpatient stays due to lung disease.  Goal renewed 2021: Reviewed [employer] calender sent in the mail for COPD information and action plan. Know when and how to use resuce inhaler and daily inhalers, along with other medication. Contact provider for any worsening signs and symptoms of shortness of breath. Call 911 if you are having sever shortness of breath, get to the hospital immediately. Emmi education provided on "COPD: what you can do". Review and plan to discuss with RN during next telephonic assessment.     . Client will verbalize knowledge of self management of Hypertension as evidences by BP reading of 140/90 or less; or as defined by provider   On track    Per Mrs. Mendez, blood pressure has been less than 140/90  Renew goal 2021: Discussed hypertension management. Reviewed blood pressure  medications from [place] records. Plan to eat low salt and heart healthy meals with fruits, vegetables, whole greains, lean protein and limit fat and sugars. Check blood pressure regularly. If you do not have a blood pressure monitor see the over the [employer] benefit catalog. If you are not able to  obtain a monitor, one will be provided to you. Take medications as prescribed. Emmi Education provided on [employer]. Review and plan to discuss with RN during next telephonic assessment.    . Obtain annual  Lipid Profile, LDL-C   On track    Try to avoid saturated fats, trans-fats and eat more fiber. Plan to take a statin (cholesterol) medicine as prescribed. Emmi Education provided on "Lipid Test". Review and plan to discuss with RN during next telephonic assessment.    . Visit Primary Care Provider or Cardiologist at least 2 times per year   On track    Continue to attend provider visits as scheduled. Complete your Annual Wellness visit every year.       Mrs. Guthmiller reports they have both received the Covid 19 vaccination. RNCM encouraged continued Covid 19 precautions. Reiterated the availability of the 24 hour nurse advice line, encouraged client/wife to call health care concierge for benefits questions. Discussed the over the counter benefit catalog. RNCM encouraged Mrs. Schauer to contact RNCM as needed.  Plan: send updated care plan to client, send updated care plan to primary care provider. Send Neurosurgeon. RNCM will outreach per tier level within the next 6 months.   Thea Silversmith, RN, MSN, [place] [phone]

## 2020-07-01 ENCOUNTER — Ambulatory Visit (INDEPENDENT_AMBULATORY_CARE_PROVIDER_SITE_OTHER): Payer: HMO | Admitting: Family Medicine

## 2020-07-01 DIAGNOSIS — Z5329 Procedure and treatment not carried out because of patient's decision for other reasons: Secondary | ICD-10-CM

## 2020-07-01 NOTE — Progress Notes (Signed)
No show

## 2020-07-08 ENCOUNTER — Other Ambulatory Visit: Payer: Self-pay | Admitting: Physician Assistant

## 2020-07-17 ENCOUNTER — Ambulatory Visit (INDEPENDENT_AMBULATORY_CARE_PROVIDER_SITE_OTHER): Payer: HMO | Admitting: *Deleted

## 2020-07-17 DIAGNOSIS — I255 Ischemic cardiomyopathy: Secondary | ICD-10-CM | POA: Diagnosis not present

## 2020-07-18 LAB — CUP PACEART REMOTE DEVICE CHECK
Battery Remaining Longevity: 56 mo
Battery Remaining Percentage: 64 %
Battery Voltage: 2.95 V
Brady Statistic AP VP Percent: 1 %
Brady Statistic AP VS Percent: 48 %
Brady Statistic AS VP Percent: 1 %
Brady Statistic AS VS Percent: 51 %
Brady Statistic RA Percent Paced: 48 %
Brady Statistic RV Percent Paced: 1 %
Date Time Interrogation Session: 20210723100613
HighPow Impedance: 78 Ohm
HighPow Impedance: 78 Ohm
Implantable Lead Implant Date: 20140217
Implantable Lead Implant Date: 20190104
Implantable Lead Location: 753859
Implantable Lead Location: 753860
Implantable Lead Model: 7122
Implantable Pulse Generator Implant Date: 20190104
Lead Channel Impedance Value: 380 Ohm
Lead Channel Impedance Value: 590 Ohm
Lead Channel Pacing Threshold Amplitude: 0.75 V
Lead Channel Pacing Threshold Amplitude: 1 V
Lead Channel Pacing Threshold Pulse Width: 0.5 ms
Lead Channel Pacing Threshold Pulse Width: 0.8 ms
Lead Channel Sensing Intrinsic Amplitude: 1.1 mV
Lead Channel Sensing Intrinsic Amplitude: 12 mV
Lead Channel Setting Pacing Amplitude: 2.5 V
Lead Channel Setting Pacing Amplitude: 2.5 V
Lead Channel Setting Pacing Pulse Width: 0.5 ms
Lead Channel Setting Sensing Sensitivity: 0.5 mV
Pulse Gen Serial Number: 9777027

## 2020-07-20 NOTE — Progress Notes (Signed)
Remote ICD transmission.   

## 2020-07-27 ENCOUNTER — Telehealth: Payer: Self-pay | Admitting: Internal Medicine

## 2020-07-27 NOTE — Telephone Encounter (Signed)
Pt c/o swelling: STAT is pt has developed SOB within 24 hours  1) How much weight have you gained and in what time span? 7/31, he gained 5lbs just in that day.    2) If swelling, where is the swelling located? In his ankles, and stomach  3) Are you currently taking a fluid pill? torsemide (DEMADEX) 20 MG tablet - usually takes 2 a day, but either yesterday or the day before the patients wife gave 4 pills due to the swelling, but the swelling didn't decrease at all.   4) Are you currently SOB? No, since he is sitting at the moment. He doesn't breath good when walking.   5) Do you have a log of your daily weights (if so, list)? No  6) Have you gained 3 pounds in a day or 5 pounds in a week? Yes  7) Have you traveled recently? No   [name] is wanting to know if she needs to make an appointment with Dr. Lovena Le or if something can be called in for him.

## 2020-07-27 NOTE — Telephone Encounter (Signed)
Left detailed message for wife per DPR.  Per Dr. Lovena Le--  1.  Take torsemide 20 mg- 2 tablets PO BID 2.  Weigh daily 3.  Follow up appt on Friday 8/6 at 9:40 am with Jonni Sanger. 4.  Get BMP at follow up appt    Will call tomorrow to confirm message received.

## 2020-07-28 NOTE — Telephone Encounter (Signed)
Call returned to wife.  She got message left yesterday.  No questions.  Ok with appt on Friday.

## 2020-07-31 ENCOUNTER — Other Ambulatory Visit: Payer: Self-pay

## 2020-07-31 ENCOUNTER — Encounter: Payer: Self-pay | Admitting: Student

## 2020-07-31 ENCOUNTER — Ambulatory Visit (INDEPENDENT_AMBULATORY_CARE_PROVIDER_SITE_OTHER): Payer: HMO | Admitting: Student

## 2020-07-31 VITALS — BP 142/68 | HR 61 | Ht 70.0 in | Wt 182.0 lb

## 2020-07-31 DIAGNOSIS — Z79899 Other long term (current) drug therapy: Secondary | ICD-10-CM

## 2020-07-31 DIAGNOSIS — I119 Hypertensive heart disease without heart failure: Secondary | ICD-10-CM

## 2020-07-31 DIAGNOSIS — I10 Essential (primary) hypertension: Secondary | ICD-10-CM | POA: Diagnosis not present

## 2020-07-31 DIAGNOSIS — I255 Ischemic cardiomyopathy: Secondary | ICD-10-CM

## 2020-07-31 DIAGNOSIS — I5022 Chronic systolic (congestive) heart failure: Secondary | ICD-10-CM | POA: Diagnosis not present

## 2020-07-31 DIAGNOSIS — Z5181 Encounter for therapeutic drug level monitoring: Secondary | ICD-10-CM

## 2020-07-31 LAB — CUP PACEART INCLINIC DEVICE CHECK
Battery Remaining Longevity: 58 mo
Brady Statistic RA Percent Paced: 47 %
Brady Statistic RV Percent Paced: 0.13 %
Date Time Interrogation Session: 20210806095508
HighPow Impedance: 73.125
Implantable Lead Implant Date: 20140217
Implantable Lead Implant Date: 20190104
Implantable Lead Location: 753859
Implantable Lead Location: 753860
Implantable Lead Model: 7122
Implantable Pulse Generator Implant Date: 20190104
Lead Channel Impedance Value: 375 Ohm
Lead Channel Impedance Value: 562.5 Ohm
Lead Channel Pacing Threshold Amplitude: 1 V
Lead Channel Pacing Threshold Amplitude: 1 V
Lead Channel Pacing Threshold Amplitude: 1 V
Lead Channel Pacing Threshold Amplitude: 1 V
Lead Channel Pacing Threshold Pulse Width: 0.5 ms
Lead Channel Pacing Threshold Pulse Width: 0.5 ms
Lead Channel Pacing Threshold Pulse Width: 0.8 ms
Lead Channel Pacing Threshold Pulse Width: 0.8 ms
Lead Channel Sensing Intrinsic Amplitude: 0.8 mV
Lead Channel Sensing Intrinsic Amplitude: 12 mV
Lead Channel Setting Pacing Amplitude: 2.5 V
Lead Channel Setting Pacing Amplitude: 2.5 V
Lead Channel Setting Pacing Pulse Width: 0.5 ms
Lead Channel Setting Sensing Sensitivity: 0.5 mV
Pulse Gen Serial Number: 9777027

## 2020-07-31 LAB — COMPREHENSIVE METABOLIC PANEL
ALT: 25 IU/L (ref 0–44)
AST: 23 IU/L (ref 0–40)
Albumin/Globulin Ratio: 1.9 (ref 1.2–2.2)
Albumin: 3.7 g/dL (ref 3.7–4.7)
Alkaline Phosphatase: 129 IU/L — ABNORMAL HIGH (ref 48–121)
BUN/Creatinine Ratio: 10 (ref 10–24)
BUN: 22 mg/dL (ref 8–27)
Bilirubin Total: 0.3 mg/dL (ref 0.0–1.2)
CO2: 24 mmol/L (ref 20–29)
Calcium: 9.5 mg/dL (ref 8.6–10.2)
Chloride: 101 mmol/L (ref 96–106)
Creatinine, Ser: 2.15 mg/dL — ABNORMAL HIGH (ref 0.76–1.27)
GFR calc Af Amer: 34 mL/min/{1.73_m2} — ABNORMAL LOW (ref >59)
GFR calc non Af Amer: 29 mL/min/{1.73_m2} — ABNORMAL LOW (ref >59)
Globulin, Total: 2 g/dL (ref 1.5–4.5)
Glucose: 106 mg/dL — ABNORMAL HIGH (ref 65–99)
Potassium: 4.1 mmol/L (ref 3.5–5.2)
Sodium: 139 mmol/L (ref 134–144)
Total Protein: 5.7 g/dL — ABNORMAL LOW (ref 6.0–8.5)

## 2020-07-31 LAB — TSH: TSH: 0.021 u[IU]/mL — ABNORMAL LOW (ref 0.450–4.500)

## 2020-07-31 MED ORDER — TORSEMIDE 20 MG PO TABS: ORAL_TABLET | ORAL | 3 refills | Status: DC

## 2020-07-31 NOTE — Progress Notes (Signed)
Electrophysiology Office Note Date: 07/31/2020  ID:  Joe Wiggins, Joe Wiggins June 02, 1946, MRN [id number]  PCP: Joe Essex, MD Primary Cardiologist: No primary care provider on file. Electrophysiologist: Dr. Lovena Wiggins  CC: Routine ICD follow-up  Joe Wiggins is a 74 y.o. male seen today for Dr. Lovena Wiggins for routine electrophysiology followup.  Since last being seen in our clinic the patient reports doing OK. He has had more SOB and edema for the past 2 weeks, per his wife. He reluctantly agrees. He is SOB changing clothes and lying flat in bed. This has very mild improvement on torsemide 40 mg BID (from 40/20).  he denies chest pain, palpitations, nausea, vomiting, dizziness, syncope, weight gain, or early satiety. He has not had ICD shocks.  He drinks > 2 L a day. Over a liter of koolaid and multiple sodas +. He does not limit salt and eats high salt meals.  He is not very active.   Device History: StMudlogger ICD implanted 2014, gen change 2019 for ICM History of AAD therapy: Yes; currently on amiodarone   Past Medical History:  Diagnosis Date  . AF (paroxysmal atrial fibrillation) ([facility])   . AICD (automatic cardioverter/defibrillator) present   . CAD (coronary artery disease)   . Cardiomyopathy, ischemic   . Carotid artery occlusion   . Chronic kidney disease   . COPD (chronic obstructive pulmonary disease) ([place])   . Dementia without behavioral disturbance (Joe [facility])   . GERD (gastroesophageal reflux disease)   . Hyperlipidemia   . Hypertension   . Sleep apnea   . Stroke (Joe [facility])   . TIA (transient ischemic attack)    Past Surgical History:  Procedure Laterality Date  . CARDIAC PACEMAKER PLACEMENT    . CATARACT EXTRACTION    . CORONARY ARTERY BYPASS GRAFT    . CORONARY STENT PLACEMENT    . EYE SURGERY    . ICD IMPLANT N/A 12/29/2017   Procedure: ICD IMPLANT;  Surgeon: Joe Lance, MD;  Location: [place] [facility];  Service: Cardiovascular;  Laterality: N/A;  . LEFT HEART CATH  AND CORS/GRAFTS ANGIOGRAPHY N/A 12/29/2017   Procedure: LEFT HEART CATH AND CORS/GRAFTS ANGIOGRAPHY;  Surgeon: Martinique, Peter M, MD;  Location: [facility];  Service: Cardiovascular;  Laterality: N/A;    Current Outpatient Medications  Medication Sig Dispense Refill  . albuterol (ACCUNEB) 0.63 MG/3ML nebulizer solution 3 ml inhale orally via nebulizer four times a day    . albuterol (PROVENTIL HFA;VENTOLIN HFA) 108 (90 Base) MCG/ACT inhaler Inhale 2 puffs into the lungs every 6 (six) hours as needed.     Marland Kitchen amiodarone (PACERONE) 200 MG tablet Take 1 tablet by mouth daily Monday through Saturday. DO NOT take on Sunday. 90 tablet 1  . apixaban (ELIQUIS) 5 MG TABS tablet Take 1 tablet (5 mg total) by mouth 2 (two) times daily. 180 tablet 1  . ascorbic acid (VITAMIN C) 500 MG tablet Take 500 mg by mouth daily.    Marland Kitchen atorvastatin (LIPITOR) 40 MG tablet Take 1 tablet (40 mg total) by mouth daily. 90 tablet 3  . cholecalciferol (VITAMIN D) 1000 units tablet Take 1,000 Units by mouth daily.    . Cyanocobalamin (VITAMIN B 12 PO) Take by mouth daily.    . ferrous sulfate 325 (65 FE) MG tablet Take 325 mg by mouth daily with breakfast.    . Fluticasone-Umeclidin-Vilant (TRELEGY ELLIPTA) 100-62.5-25 MCG/INH AEPB Inhale 1 puff into the lungs in the morning.    Marland Kitchen  folic acid (FOLVITE) 981 MCG tablet Take 400 mcg by mouth daily.    . hydrALAZINE (APRESOLINE) 25 MG tablet Take 0.5 tablets (12.5 mg total) by mouth 2 (two) times daily. 180 tablet 3  . labetalol (NORMODYNE) 300 MG tablet Take 1 tablet by mouth 2 (two) times a day.    . Multiple Vitamin (MULTIVITAMIN) tablet Take 1 tablet by mouth daily.    . nitroGLYCERIN (NITROSTAT) 0.4 MG SL tablet Place 0.4 mg under the tongue every 5 (five) minutes as needed for chest pain.    Marland Kitchen torsemide (DEMADEX) 20 MG tablet Take 2 tablets in the AM and 2 tablets in the PM 360 tablet 3   No current facility-administered medications for this visit.    Allergies:    Ipratropium, Metoprolol, Morphine and related, Vicodin [hydrocodone-acetaminophen], and Penicillins   Social History: Social History   Socioeconomic History  . Marital status: Married    Spouse name: Not on file  . Number of children: Not on file  . Years of education: Not on file  . Highest education level: Not on file  Occupational History  . Not on file  Tobacco Use  . Smoking status: Former Smoker    Types: Cigarettes    Quit date: 11/30/2012    Years since quitting: 7.6  . Smokeless tobacco: Never Used  Vaping Use  . Vaping Use: Never used  Substance and Sexual Activity  . Alcohol use: No  . Drug use: No  . Sexual activity: Not on file  Other Topics Concern  . Not on file  Social History Narrative  . Not on file   Social Determinants of Health   Financial Resource Strain:   . Difficulty of Paying Living Expenses:   Food Insecurity: No Food Insecurity  . Worried About Charity fundraiser in the Last Year: Never true  . Ran Out of Food in the Last Year: Never true  Transportation Needs: No Transportation Needs  . Lack of Transportation (Medical): No  . Lack of Transportation (Non-Medical): No  Physical Activity:   . Days of Exercise per Week:   . Minutes of Exercise per Session:   Stress:   . Feeling of Stress :   Social Connections:   . Frequency of Communication with Friends and Family:   . Frequency of Social Gatherings with Friends and Family:   . Attends Religious Services:   . Active Member of Clubs or Organizations:   . Attends Archivist Meetings:   Marland Kitchen Marital Status:   Intimate Partner Violence:   . Fear of Current or Ex-Partner:   . Emotionally Abused:   Marland Kitchen Physically Abused:   . Sexually Abused:     Family History: Family History  Problem Relation Age of Onset  . Hypertension Mother   . Heart disease Mother   . Hypertension Sister   . Heart disease Sister   . Thyroid disease Sister   . Thyroid disease Brother        hyperthyroid     Review of Systems: All other systems reviewed and are otherwise negative except as noted above.   Physical Exam: Vitals:   07/31/20 0928  BP: (!) 142/68  Pulse: 61  SpO2: 98%  Weight: 182 lb (82.6 kg)  Height: 5\' 10"  (1.778 Wiggins)     GEN- The patient is well appearing, alert and oriented x 3 today.   HEENT: normocephalic, atraumatic; sclera clear, conjunctiva pink; hearing intact; oropharynx clear; neck supple, no JVP Lymph- no  cervical lymphadenopathy Lungs- Clear to ausculation bilaterally, normal work of breathing.  No wheezes, rales, rhonchi Heart- Regular rate and rhythm, no murmurs, rubs or gallops, PMI not laterally displaced GI- soft, non-tender, non-distended, bowel sounds present, no hepatosplenomegaly Extremities- no clubbing, cyanosis, or edema; DP/PT/radial pulses 2+ bilaterally MS- no significant deformity or atrophy Skin- warm and dry, no rash or lesion; ICD pocket well healed Psych- euthymic mood, full affect Neuro- strength and sensation are intact  ICD interrogation- reviewed in detail today,  See PACEART report  EKG:  EKG is ordered today. The ekg ordered today shows NSR at 61 bpm, QRS 132 bpm.   Recent Labs: 11/19/2019: ALT 24; TSH 0.987 01/20/2020: Hemoglobin 11.8; Platelets 342 02/24/2020: BUN 25; Creatinine, Ser 2.18; Potassium 4.1; Sodium 138   Wt Readings from Last 3 Encounters:  07/31/20 182 lb (82.6 kg)  05/26/20 179 lb 6.4 oz (81.4 kg)  04/28/20 177 lb 12.8 oz (80.6 kg)     Other studies Reviewed: Additional studies/ records that were reviewed today include: Previous EP office notes.    Assessment and Plan:  1.  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 He has had some improvement on torsemide 40 mg BID, and his thoracic impedence is trending back  towards normal.  Reviewed sodium and fluid restrictions (< 2 g and 2L daily)  2. VT Maintaining NSR on amiodarone  3. PAF Maintaining NSR on amiodarone Denies bleeding on Eliquis.   4. Deconditioning Confounds his symptoms. Has weight gain and symptoms out of proportion to his volume. Recommend weight loss through diet and gradual increase in his exercise.    Current medicines are reviewed at length with the patient today.   The patient does not have concerns regarding his medicines.  The following changes were made today:  Refilled torsemide for 40 mg BID.   Labs/ tests ordered today include:  Orders Placed This Encounter  Procedures  . Comprehensive metabolic panel  . TSH  . CUP PACEART [place]  . EKG 12-Lead    Disposition:   Follow up with me in 2 months to re-assess symptoms. Sooner with any worsening dyspnea.   Jacalyn Lefevre, PA-C  07/31/2020 10:06 AM  [facility] [address] [place] [address] [phone] (office) [phone] (fax)

## 2020-07-31 NOTE — Patient Instructions (Addendum)
Medication Instructions:   START TAKING:    1. TORSEMIDE 40 MG IN THE AM AND 40 MG IN THE PM   *If you need a refill on your cardiac medications before your next appointment, please call your pharmacy*   Lab Work: CMET AND TSH TODAY   If you have labs (blood work) drawn today and your tests are completely normal, you will receive your results only by: Marland Kitchen MyChart Message (if you have MyChart) OR . A paper copy in the mail If you have any lab test that is abnormal or we need to change your treatment, we will call you to review the results.   Testing/Procedures: NONE ORDERED  TODAY   Follow-Up: At [facility], you and your health needs are our priority.  As part of our continuing mission to provide you with exceptional heart care, we have created designated Provider Care Teams.  These Care Teams include your primary Cardiologist (physician) and Advanced Practice Providers (APPs -  Physician Assistants and Nurse Practitioners) who all work together to provide you with the care you need, when you need it.  We recommend signing up for the patient portal called "MyChart".  Sign up information is provided on this After Visit Summary.  MyChart is used to connect with patients for Virtual Visits (Telemedicine).  Patients are able to view lab/test results, encounter notes, upcoming appointments, etc.  Non-urgent messages can be sent to your provider as well.   To learn more about what you can do with MyChart, go to [email or web address].    Your next appointment:   2 month(s)  The format for your next appointment:   In Person  Provider:    You may see Legrand Como "Oda Kilts, PA-C    Other Instructions

## 2020-08-12 ENCOUNTER — Telehealth: Payer: Self-pay | Admitting: Internal Medicine

## 2020-08-12 NOTE — Telephone Encounter (Signed)
Joe Wiggins is calling wanting to know if Joe Wiggins should get the booster shot. Please advise.

## 2020-08-17 ENCOUNTER — Telehealth: Payer: Self-pay | Admitting: Internal Medicine

## 2020-08-17 DIAGNOSIS — Z95810 Presence of automatic (implantable) cardiac defibrillator: Secondary | ICD-10-CM

## 2020-08-17 DIAGNOSIS — Z79899 Other long term (current) drug therapy: Secondary | ICD-10-CM

## 2020-08-17 DIAGNOSIS — Z5181 Encounter for therapeutic drug level monitoring: Secondary | ICD-10-CM

## 2020-08-17 DIAGNOSIS — I5022 Chronic systolic (congestive) heart failure: Secondary | ICD-10-CM

## 2020-08-17 MED ORDER — HYDRALAZINE HCL 25 MG PO TABS: 25.0000 mg | ORAL_TABLET | Freq: Three times a day (TID) | ORAL | 3 refills | Status: DC

## 2020-08-17 NOTE — Telephone Encounter (Signed)
Per DPR spoke with patients wife [name] who is aware and agreeable to increasing patients Hydralazine to 25 mg three times daily. Per her request I will send updated prescription to [facility] listed in the patients chart. The patient will come in on Wednesday 08/19/20 to have TSH, free t3, and free t4 drawn.

## 2020-08-17 NOTE — Telephone Encounter (Signed)
Please increase hydralazine to 25 mg *TID* (if patient willing to take three times a day).   He also needs repeat TSH, along with free T3 and free T4  Thank you!  Legrand Como [address]" [place], PA-C  08/17/2020 10:07 AM

## 2020-08-17 NOTE — Telephone Encounter (Signed)
Pt c/o BP issue: STAT if pt c/o blurred vision, one-sided weakness or slurred speech  1. What are your last 5 BP readings?  08/17/20: 179/96 08/16/20: 181/91 08/15/20: 173/85 08/14/20: 185/90 08/13/20: 179/82  2. Are you having any other symptoms (ex. Dizziness, headache, blurred vision, passed out)? no  3. What is your BP issue? Wife called because the patients BP readings are high. The patient says he feels fine. The wife wanted to know if his medication may need adjusted again. She states it has been higher sine the patient has increased the dosage of his torsemide (DEMADEX) 20 MG tablet

## 2020-08-19 ENCOUNTER — Other Ambulatory Visit: Payer: Self-pay

## 2020-08-19 ENCOUNTER — Other Ambulatory Visit: Payer: HMO | Admitting: *Deleted

## 2020-08-19 DIAGNOSIS — I5022 Chronic systolic (congestive) heart failure: Secondary | ICD-10-CM | POA: Diagnosis not present

## 2020-08-19 DIAGNOSIS — Z95810 Presence of automatic (implantable) cardiac defibrillator: Secondary | ICD-10-CM | POA: Diagnosis not present

## 2020-08-19 DIAGNOSIS — Z79899 Other long term (current) drug therapy: Secondary | ICD-10-CM

## 2020-08-19 DIAGNOSIS — Z5181 Encounter for therapeutic drug level monitoring: Secondary | ICD-10-CM

## 2020-08-19 LAB — TSH: TSH: 0.06 u[IU]/mL — ABNORMAL LOW (ref 0.450–4.500)

## 2020-08-19 LAB — T3, FREE: T3, Free: 3.9 pg/mL (ref 2.0–4.4)

## 2020-08-19 LAB — T4, FREE: Free T4: 1.38 ng/dL (ref 0.82–1.77)

## 2020-08-19 NOTE — Telephone Encounter (Signed)
Call placed to [name].  Advised Dr. Lovena Le DOES recommend booster shot when it becomes available and Pt qualifies.

## 2020-09-04 ENCOUNTER — Telehealth: Payer: Self-pay | Admitting: Internal Medicine

## 2020-09-04 NOTE — Telephone Encounter (Signed)
Follow up with AT moved up to 9/15 to address continued blood pressure concerns.  Pt previously seen by hypertension clinic.

## 2020-09-04 NOTE — Telephone Encounter (Signed)
New message:    Patient wife calling stating that the Hydralazine  that he is taking is not bring his BP down. Please call back.

## 2020-09-09 ENCOUNTER — Encounter: Payer: Self-pay | Admitting: Student

## 2020-09-09 ENCOUNTER — Ambulatory Visit (INDEPENDENT_AMBULATORY_CARE_PROVIDER_SITE_OTHER): Payer: HMO | Admitting: Student

## 2020-09-09 ENCOUNTER — Other Ambulatory Visit: Payer: Self-pay

## 2020-09-09 VITALS — BP 180/74 | HR 76 | Ht 70.0 in | Wt 180.4 lb

## 2020-09-09 DIAGNOSIS — I119 Hypertensive heart disease without heart failure: Secondary | ICD-10-CM | POA: Diagnosis not present

## 2020-09-09 DIAGNOSIS — Z95810 Presence of automatic (implantable) cardiac defibrillator: Secondary | ICD-10-CM | POA: Diagnosis not present

## 2020-09-09 DIAGNOSIS — I5022 Chronic systolic (congestive) heart failure: Secondary | ICD-10-CM

## 2020-09-09 DIAGNOSIS — I10 Essential (primary) hypertension: Secondary | ICD-10-CM

## 2020-09-09 LAB — CUP PACEART INCLINIC DEVICE CHECK
Battery Remaining Longevity: 61 mo
Brady Statistic RA Percent Paced: 17 %
Brady Statistic RV Percent Paced: 0.01 %
Date Time Interrogation Session: 20210915125451
HighPow Impedance: 77.625
Implantable Lead Implant Date: 20140217
Implantable Lead Implant Date: 20190104
Implantable Lead Location: 753859
Implantable Lead Location: 753860
Implantable Lead Model: 7122
Implantable Pulse Generator Implant Date: 20190104
Lead Channel Impedance Value: 362.5 Ohm
Lead Channel Impedance Value: 600 Ohm
Lead Channel Pacing Threshold Amplitude: 1 V
Lead Channel Pacing Threshold Amplitude: 1 V
Lead Channel Pacing Threshold Amplitude: 1 V
Lead Channel Pacing Threshold Amplitude: 1 V
Lead Channel Pacing Threshold Pulse Width: 0.5 ms
Lead Channel Pacing Threshold Pulse Width: 0.5 ms
Lead Channel Pacing Threshold Pulse Width: 0.8 ms
Lead Channel Pacing Threshold Pulse Width: 0.8 ms
Lead Channel Sensing Intrinsic Amplitude: 1.1 mV
Lead Channel Sensing Intrinsic Amplitude: 12 mV
Lead Channel Setting Pacing Amplitude: 2.5 V
Lead Channel Setting Pacing Amplitude: 2.5 V
Lead Channel Setting Pacing Pulse Width: 0.5 ms
Lead Channel Setting Sensing Sensitivity: 0.5 mV
Pulse Gen Serial Number: 9777027

## 2020-09-09 MED ORDER — HYDRALAZINE HCL 50 MG PO TABS: 50.0000 mg | ORAL_TABLET | Freq: Three times a day (TID) | ORAL | 1 refills | Status: DC

## 2020-09-09 NOTE — Progress Notes (Signed)
Electrophysiology Office Note Date: 09/09/2020  ID:  Joe [facility], DOB 10-26-1946, MRN [id number]  PCP: Ezequiel Essex, MD Primary Cardiologist: No primary care provider on file. Electrophysiologist: Cristopher Peru, MD   CC: Routine ICD follow-up  Joe Wiggins is a 74 y.o. male seen today for Cristopher Peru, MD for routine electrophysiology followup.  Since last being seen in our clinic the patient reports doing about the same. He has had elevated BP for the past month. BP at home running anywhere from 140s to more often 160 and above. He has "swimmy-headedness" about twice a week, and more so in the mornings than any other time of day. He denies chest pain or syncope. No palpitations. No nausea, vomiting, fever, or chills. He has not had ICD shocks.   Device History: StMudlogger ICD implanted 2014, gen change 2019 for ICM History of AAD therapy: Yes; currently on amiodarone   Past Medical History:  Diagnosis Date  . AF (paroxysmal atrial fibrillation) ([place])   . AICD (automatic cardioverter/defibrillator) present   . CAD (coronary artery disease)   . Cardiomyopathy, ischemic   . Carotid artery occlusion   . Chronic kidney disease   . COPD (chronic obstructive pulmonary disease) ([place])   . Dementia without behavioral disturbance ([place])   . GERD (gastroesophageal reflux disease)   . Hyperlipidemia   . Hypertension   . Sleep apnea   . Stroke ([place])   . TIA (transient ischemic attack)    Past Surgical History:  Procedure Laterality Date  . CARDIAC PACEMAKER PLACEMENT    . CATARACT EXTRACTION    . CORONARY ARTERY BYPASS GRAFT    . CORONARY STENT PLACEMENT    . EYE SURGERY    . ICD IMPLANT N/A 12/29/2017   Procedure: ICD IMPLANT;  Surgeon: Evans Lance, MD;  Location: [place] [facility];  Service: Cardiovascular;  Laterality: N/A;  . LEFT HEART CATH AND CORS/GRAFTS ANGIOGRAPHY N/A 12/29/2017   Procedure: LEFT HEART CATH AND CORS/GRAFTS ANGIOGRAPHY;  Surgeon: Martinique, Peter M,  MD;  Location: [place] [facility];  Service: Cardiovascular;  Laterality: N/A;    Current Outpatient Medications  Medication Sig Dispense Refill  . albuterol (ACCUNEB) 0.63 MG/3ML nebulizer solution 3 ml inhale orally via nebulizer four times a day    . albuterol (PROVENTIL HFA;VENTOLIN HFA) 108 (90 Base) MCG/ACT inhaler Inhale 2 puffs into the lungs every 6 (six) hours as needed.     Marland Kitchen amiodarone (PACERONE) 200 MG tablet Take 1 tablet by mouth daily Monday through Saturday. DO NOT take on Sunday. 90 tablet 1  . apixaban (ELIQUIS) 5 MG TABS tablet Take 1 tablet (5 mg total) by mouth 2 (two) times daily. 180 tablet 1  . ascorbic acid (VITAMIN C) 500 MG tablet Take 500 mg by mouth daily.    Marland Kitchen atorvastatin (LIPITOR) 40 MG tablet Take 1 tablet (40 mg total) by mouth daily. 90 tablet 3  . cholecalciferol (VITAMIN D) 1000 units tablet Take 1,000 Units by mouth daily.    . Cyanocobalamin (VITAMIN B 12 PO) Take by mouth daily.    . ferrous sulfate 325 (65 FE) MG tablet Take 325 mg by mouth daily with breakfast.    . Fluticasone-Umeclidin-Vilant (TRELEGY ELLIPTA) 100-62.5-25 MCG/INH AEPB Inhale 1 puff into the lungs in the morning.    . folic acid (FOLVITE) 458 MCG tablet Take 400 mcg by mouth daily.    Marland Kitchen labetalol (NORMODYNE) 300 MG tablet Take 1 tablet by mouth 2 (two)  times a day.    . Multiple Vitamin (MULTIVITAMIN) tablet Take 1 tablet by mouth daily.    . nitroGLYCERIN (NITROSTAT) 0.4 MG SL tablet Place 0.4 mg under the tongue every 5 (five) minutes as needed for chest pain.    Marland Kitchen torsemide (DEMADEX) 20 MG tablet Take 2 tablets in the AM and 2 tablets in the PM 360 tablet 3  . hydrALAZINE (APRESOLINE) 50 MG tablet Take 1 tablet (50 mg total) by mouth 3 (three) times daily. 270 tablet 1   No current facility-administered medications for this visit.    Allergies:   Ipratropium, Metoprolol, Morphine and related, Vicodin [hydrocodone-acetaminophen], and Penicillins   Social History: Social  History   Socioeconomic History  . Marital status: Married    Spouse name: Not on file  . Number of children: Not on file  . Years of education: Not on file  . Highest education level: Not on file  Occupational History  . Not on file  Tobacco Use  . Smoking status: Former Smoker    Types: Cigarettes    Quit date: 11/30/2012    Years since quitting: 7.7  . Smokeless tobacco: Never Used  Vaping Use  . Vaping Use: Never used  Substance and Sexual Activity  . Alcohol use: No  . Drug use: No  . Sexual activity: Not on file  Other Topics Concern  . Not on file  Social History Narrative  . Not on file   Social Determinants of Health   Financial Resource Strain:   . Difficulty of Paying Living Expenses: Not on file  Food Insecurity: No Food Insecurity  . Worried About Charity fundraiser in the Last Year: Never true  . Ran Out of Food in the Last Year: Never true  Transportation Needs: No Transportation Needs  . Lack of Transportation (Medical): No  . Lack of Transportation (Non-Medical): No  Physical Activity:   . Days of Exercise per Week: Not on file  . Minutes of Exercise per Session: Not on file  Stress:   . Feeling of Stress : Not on file  Social Connections:   . Frequency of Communication with Friends and Family: Not on file  . Frequency of Social Gatherings with Friends and Family: Not on file  . Attends Religious Services: Not on file  . Active Member of Clubs or Organizations: Not on file  . Attends Archivist Meetings: Not on file  . Marital Status: Not on file  Intimate Partner Violence:   . Fear of Current or Ex-Partner: Not on file  . Emotionally Abused: Not on file  . Physically Abused: Not on file  . Sexually Abused: Not on file    Family History: Family History  Problem Relation Age of Onset  . Hypertension Mother   . Heart disease Mother   . Hypertension Sister   . Heart disease Sister   . Thyroid disease Sister   . Thyroid disease  Brother        hyperthyroid    Review of Systems: All other systems reviewed and are otherwise negative except as noted above.   Physical Exam: Vitals:   09/09/20 1230  BP: (!) 180/74  Pulse: 76  SpO2: 96%  Weight: 180 lb 6.4 oz (81.8 kg)  Height: 5\' 10"  (1.778 m)     GEN- The patient is well appearing, alert and oriented x 3 today.   HEENT: normocephalic, atraumatic; sclera clear, conjunctiva pink; hearing intact; oropharynx clear; neck supple, no JVP  Lymph- no cervical lymphadenopathy Lungs- Clear to ausculation bilaterally, normal work of breathing.  No wheezes, rales, rhonchi Heart- Regular rate and rhythm, no murmurs, rubs or gallops, PMI not laterally displaced GI- soft, non-tender, non-distended, bowel sounds present, no hepatosplenomegaly Extremities- no clubbing or cyanosis. No edema; DP/PT/radial pulses 2+ bilaterally MS- no significant deformity or atrophy Skin- warm and dry, no rash or lesion; ICD pocket well healed Psych- euthymic mood, full affect Neuro- strength and sensation are intact  ICD interrogation- reviewed in detail today,  See PACEART report  EKG:  EKG is not ordered today.  Recent Labs: 01/20/2020: Hemoglobin 11.8; Platelets 342 07/31/2020: ALT 25; BUN 22; Creatinine, Ser 2.15; Potassium 4.1; Sodium 139 08/19/2020: TSH 0.060   Wt Readings from Last 3 Encounters:  09/09/20 180 lb 6.4 oz (81.8 kg)  07/31/20 182 lb (82.6 kg)  05/26/20 179 lb 6.4 oz (81.4 kg)     Other studies Reviewed: Additional studies/ records that were reviewed today include: Previous EP notes   Assessment and Plan:  1.  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 Thoracic impedence stable currently. Continue sodium and fluid restrictions (< 2 g and 2L daily)  2. VT No further on amiodarone.   3.  PAF Maintaining NSR on amiodarone Denies bleeding on Eliquis.   4. Deconditioning Confounds his symptoms. Has weight gain and symptoms out of proportion to his volume. Recommend weight loss through diet and gradual increase in his exercise.   5. HTN Has been elevated recently.  No falls or near falls. Occasional lightheadedness in the am.  Will increase hydralazine to 50 mg TID. He has had more orthostasis with higher doses too, so may not tolerate going much higher than this. He may end up requiring clonidine. Would appreciate nephrology recommendation for how best to proceed with continued   Current medicines are reviewed at length with the patient today.   The patient does not have concerns regarding his medicines.  The following changes were made today:  Hydralazine increased as above.   Labs/ tests ordered today include:  Orders Placed This Encounter  Procedures  . Basic Metabolic Panel (BMET)    Disposition:   Follow up with EP APP  3 months  Signed, Shirley Friar, PA-C  09/09/2020 1:09 PM  [place] [address] [phone] (office) [phone] (fax)

## 2020-09-09 NOTE — Patient Instructions (Signed)
Medication Instructions:  Your physician has recommended you make the following change in your medication: -- INCREASE Hydralazine -- Take 1 tablet (50 mg) by mouth three (3) times daily -- NEW RX SENT *If you need a refill on your cardiac medications before your next appointment, please call your pharmacy*  Lab Work: Your physician has recommended that you have lab work today: BMET   If you have labs (blood work) drawn today and your tests are completely normal, you will receive your results only by: Marland Kitchen MyChart Message (if you have MyChart) OR . A paper copy in the mail If you have any lab test that is abnormal or we need to change your treatment, we will call you to review the results.  Follow-Up: At [facility], you and your health needs are our priority.  As part of our continuing mission to provide you with exceptional heart care, we have created designated Provider Care Teams.  These Care Teams include your primary Cardiologist (physician) and Advanced Practice Providers (APPs -  Physician Assistants and Nurse Practitioners) who all work together to provide you with the care you need, when you need it.  We recommend signing up for the patient portal called "MyChart".  Sign up information is provided on this After Visit Summary.  MyChart is used to connect with patients for Virtual Visits (Telemedicine).  Patients are able to view lab/test results, encounter notes, upcoming appointments, etc.  Non-urgent messages can be sent to your provider as well.   To learn more about what you can do with MyChart, go to [email or web address].    Your next appointment:   Your physician recommends that you schedule a follow-up appointment in: 3 MONTHS with Oda Kilts, PA-C   The format for your next appointment:   In Person with Legrand Como "Oda Kilts, PA-C

## 2020-09-10 LAB — BASIC METABOLIC PANEL
BUN/Creatinine Ratio: 9 — ABNORMAL LOW (ref 10–24)
BUN: 19 mg/dL (ref 8–27)
CO2: 26 mmol/L (ref 20–29)
Calcium: 9.8 mg/dL (ref 8.6–10.2)
Chloride: 100 mmol/L (ref 96–106)
Creatinine, Ser: 2.15 mg/dL — ABNORMAL HIGH (ref 0.76–1.27)
GFR calc Af Amer: 34 mL/min/{1.73_m2} — ABNORMAL LOW (ref >59)
GFR calc non Af Amer: 29 mL/min/{1.73_m2} — ABNORMAL LOW (ref >59)
Glucose: 95 mg/dL (ref 65–99)
Potassium: 4.5 mmol/L (ref 3.5–5.2)
Sodium: 140 mmol/L (ref 134–144)

## 2020-09-15 ENCOUNTER — Telehealth: Payer: Self-pay | Admitting: Internal Medicine

## 2020-09-15 NOTE — Telephone Encounter (Signed)
Pt c/o medication issue:  1. Name of Medication: hydrALAZINE (APRESOLINE) 50 MG tablet  2. How are you currently taking this medication (dosage and times per day)? 1 tablet 3 times a day  3. Are you having a reaction (difficulty breathing--STAT)? no  4. What is your medication issue? Arbie Cookey from [facility] they received a refill request for hydralazine 25 mg, but it is being rejected because hydralazine 50 mg was sent to another pharmacy. She would like to clarify which medication the patient is to take. Phone: [phone], Ref: [id number]

## 2020-09-15 NOTE — Telephone Encounter (Signed)
Left detailed message for [facility] clarifying order.

## 2020-09-24 ENCOUNTER — Other Ambulatory Visit: Payer: Self-pay | Admitting: *Deleted

## 2020-09-24 MED ORDER — APIXABAN 5 MG PO TABS
5.0000 mg | ORAL_TABLET | Freq: Two times a day (BID) | ORAL | 2 refills | Status: DC
Start: 2020-09-24 — End: 2021-05-26

## 2020-09-24 NOTE — Telephone Encounter (Signed)
Eliquis 5mg  refill request received. Patient is 74 years old, weight-81.8kg, Crea-2.15 on 09/09/2020, Diagnosis-Afib, and last seen by [place] PA on 09/09/2020. Dose is appropriate based on dosing criteria. Will send in refill to requested pharmacy.

## 2020-10-05 ENCOUNTER — Encounter: Payer: HMO | Admitting: Student

## 2020-10-16 ENCOUNTER — Ambulatory Visit (INDEPENDENT_AMBULATORY_CARE_PROVIDER_SITE_OTHER): Payer: HMO

## 2020-10-16 DIAGNOSIS — I5022 Chronic systolic (congestive) heart failure: Secondary | ICD-10-CM

## 2020-10-17 LAB — CUP PACEART REMOTE DEVICE CHECK
Battery Remaining Longevity: 56 mo
Battery Remaining Percentage: 62 %
Battery Voltage: 2.95 V
Brady Statistic AP VP Percent: 1 %
Brady Statistic AP VS Percent: 8.3 %
Brady Statistic AS VP Percent: 1 %
Brady Statistic AS VS Percent: 91 %
Brady Statistic RA Percent Paced: 7.9 %
Brady Statistic RV Percent Paced: 1 %
Date Time Interrogation Session: 20211022094057
HighPow Impedance: 81 Ohm
HighPow Impedance: 81 Ohm
Implantable Lead Implant Date: 20140217
Implantable Lead Implant Date: 20190104
Implantable Lead Location: 753859
Implantable Lead Location: 753860
Implantable Lead Model: 7122
Implantable Pulse Generator Implant Date: 20190104
Lead Channel Impedance Value: 350 Ohm
Lead Channel Impedance Value: 600 Ohm
Lead Channel Pacing Threshold Amplitude: 1 V
Lead Channel Pacing Threshold Amplitude: 1 V
Lead Channel Pacing Threshold Pulse Width: 0.5 ms
Lead Channel Pacing Threshold Pulse Width: 0.8 ms
Lead Channel Sensing Intrinsic Amplitude: 0.9 mV
Lead Channel Sensing Intrinsic Amplitude: 12 mV
Lead Channel Setting Pacing Amplitude: 2.5 V
Lead Channel Setting Pacing Amplitude: 2.5 V
Lead Channel Setting Pacing Pulse Width: 0.5 ms
Lead Channel Setting Sensing Sensitivity: 0.5 mV
Pulse Gen Serial Number: 9777027

## 2020-10-21 NOTE — Progress Notes (Signed)
Remote ICD transmission.   

## 2020-10-29 ENCOUNTER — Other Ambulatory Visit: Payer: Self-pay | Admitting: Internal Medicine

## 2020-10-29 NOTE — Telephone Encounter (Signed)
Pt calling requesting a refill on labetalol. Dr. Lovena Le did not prescribe this medication. Would Dr. Lovena Le like to refill this medication? Please address

## 2020-10-29 NOTE — Telephone Encounter (Signed)
    *  STAT* If patient is at the pharmacy, call can be transferred to refill team.   1. Which medications need to be refilled? (please list name of each medication and dose if known)   labetalol (NORMODYNE) 300 MG tablet    2. Which pharmacy/location (including street and city if local pharmacy) is medication to be sent to? [facility] [phone] - [place], [place]  3. Do they need a 30 day or 90 day supply? 90 days   Pt is completely out of medication and need refill today

## 2020-10-30 MED ORDER — LABETALOL HCL 300 MG PO TABS
300.0000 mg | ORAL_TABLET | Freq: Two times a day (BID) | ORAL | 3 refills | Status: DC
Start: 2020-10-30 — End: 2021-06-10

## 2020-11-02 ENCOUNTER — Telehealth: Payer: Self-pay | Admitting: Internal Medicine

## 2020-11-02 MED ORDER — NITROGLYCERIN 0.4 MG SL SUBL: 0.4000 mg | SUBLINGUAL_TABLET | SUBLINGUAL | 3 refills | Status: DC | PRN

## 2020-11-02 MED ORDER — NITROGLYCERIN 0.4 MG SL SUBL: 0.4000 mg | SUBLINGUAL_TABLET | SUBLINGUAL | 2 refills | Status: DC | PRN

## 2020-11-02 NOTE — Telephone Encounter (Signed)
Refilled as requested  

## 2020-11-02 NOTE — Telephone Encounter (Signed)
*  STAT* If patient is at the pharmacy, call can be transferred to refill team.   1. Which medications need to be refilled? (please list name of each medication and dose if known) nitroGLYCERIN (NITROSTAT) 0.4 MG SL tablet  2. Which pharmacy/location (including street and city if local pharmacy) is medication to be sent to? [facility] [phone] - [place], [place]  3. Do they need a 30 day or 90 day supply? 51    [name] states that the patient's nitroglycerin is expired and would like a new prescription sent in. She would like a call back when it gets refilled.

## 2020-11-02 NOTE — Telephone Encounter (Signed)
Pt's medication was sent to pt's pharmacy as requested. Confirmation received.  °

## 2020-11-02 NOTE — Telephone Encounter (Signed)
Pt's wife is requesting a refill on nitroglycerin. Would Dr. Lovena Le like to refill this medication? Please address

## 2020-11-03 ENCOUNTER — Other Ambulatory Visit: Payer: Self-pay | Admitting: Internal Medicine

## 2020-11-17 DIAGNOSIS — N184 Chronic kidney disease, stage 4 (severe): Secondary | ICD-10-CM | POA: Diagnosis not present

## 2020-11-26 DIAGNOSIS — D649 Anemia, unspecified: Secondary | ICD-10-CM | POA: Diagnosis not present

## 2020-11-26 DIAGNOSIS — I129 Hypertensive chronic kidney disease with stage 1 through stage 4 chronic kidney disease, or unspecified chronic kidney disease: Secondary | ICD-10-CM | POA: Diagnosis not present

## 2020-11-26 DIAGNOSIS — N184 Chronic kidney disease, stage 4 (severe): Secondary | ICD-10-CM | POA: Diagnosis not present

## 2020-12-08 ENCOUNTER — Other Ambulatory Visit: Payer: Self-pay

## 2020-12-08 NOTE — Patient Outreach (Signed)
  [facility]) Care Management Chronic Special Needs Program    12/08/2020  Name: Sharon Stapel, [place]: [birth year]  MRN: [id number]   Mr. Shay Bartoli is enrolled in a chronic special needs plan for Heart Failure. [facility] Management will continue to provide services for this member through 12/25/2020. The [facility] Care Management Team will assume care 12/26/2020.   Thea Silversmith, RN, MSN, [place] [phone]

## 2020-12-14 ENCOUNTER — Encounter: Payer: HMO | Admitting: Student

## 2020-12-21 DIAGNOSIS — R059 Cough, unspecified: Secondary | ICD-10-CM | POA: Diagnosis not present

## 2020-12-21 DIAGNOSIS — J441 Chronic obstructive pulmonary disease with (acute) exacerbation: Secondary | ICD-10-CM | POA: Diagnosis not present

## 2020-12-23 DIAGNOSIS — L03317 Cellulitis of buttock: Secondary | ICD-10-CM | POA: Diagnosis not present

## 2020-12-24 ENCOUNTER — Telehealth: Payer: Self-pay | Admitting: Emergency Medicine

## 2020-12-24 ENCOUNTER — Telehealth: Payer: Self-pay | Admitting: Internal Medicine

## 2020-12-24 NOTE — Telephone Encounter (Signed)
Spoke with [name](wife) per DPR and husband sleeping. She reports he did not feel a shock on 12/06/20 and he did not miss any doses of amiodorone or any other medications. Reminded of [place] DMV driving restrictions and appointment wiTillery PA 12/28/20. She reports no change in patient condition or concerns. Shock plan reviewed and ED precautions given.

## 2020-12-24 NOTE — Telephone Encounter (Signed)
[name] is requesting to speak with Dr. Tanna Furry nurse. She states the patient has bruising under his breast. She assumes the bruising is from a recent fall and she would like a call back to discuss further.

## 2020-12-24 NOTE — Telephone Encounter (Signed)
Returned call to wife.  Advised bruise is not from ICD shock.  Wife states she did not hear ICD go off.  Advised she would not hear a sound from an ICD shock.  Pt has f/u on 12/29/19.

## 2020-12-24 NOTE — Telephone Encounter (Signed)
Contacted [facility] tech support and confirmed monitor was active at time of event on 12/06/20 at 0839. Tech support reported diagnostics were being performed but monitor may have been too far from patient or something between the patient and the monitor that prevented relay of information until 12/23/20.   Patient contacted and confirmed he did not feel shock, reports he was sleeping at the time of the event and was aymptomatic after he woke up 12/06/20. Wife reports he has a CPAP machine in front of remote monitor. Advised to ensure monitor is 3-6 feet from where patient sleeps and make sure nothing is between the monitor and the patient. [place] DMV restrictions and shock plan reviewed with patient. Patient and wife verbalized understanding of education on shock plan, driving restrictions and monitor location.

## 2020-12-26 ENCOUNTER — Other Ambulatory Visit: Payer: Self-pay

## 2020-12-26 ENCOUNTER — Encounter (HOSPITAL_COMMUNITY): Payer: Self-pay | Admitting: Emergency Medicine

## 2020-12-26 DIAGNOSIS — Z20822 Contact with and (suspected) exposure to covid-19: Secondary | ICD-10-CM | POA: Diagnosis present

## 2020-12-26 DIAGNOSIS — Z8674 Personal history of sudden cardiac arrest: Secondary | ICD-10-CM

## 2020-12-26 DIAGNOSIS — Z888 Allergy status to other drugs, medicaments and biological substances status: Secondary | ICD-10-CM

## 2020-12-26 DIAGNOSIS — Z955 Presence of coronary angioplasty implant and graft: Secondary | ICD-10-CM

## 2020-12-26 DIAGNOSIS — Z7901 Long term (current) use of anticoagulants: Secondary | ICD-10-CM

## 2020-12-26 DIAGNOSIS — J90 Pleural effusion, not elsewhere classified: Secondary | ICD-10-CM | POA: Diagnosis not present

## 2020-12-26 DIAGNOSIS — K219 Gastro-esophageal reflux disease without esophagitis: Secondary | ICD-10-CM | POA: Diagnosis present

## 2020-12-26 DIAGNOSIS — Z950 Presence of cardiac pacemaker: Secondary | ICD-10-CM

## 2020-12-26 DIAGNOSIS — Z951 Presence of aortocoronary bypass graft: Secondary | ICD-10-CM

## 2020-12-26 DIAGNOSIS — N179 Acute kidney failure, unspecified: Secondary | ICD-10-CM | POA: Diagnosis present

## 2020-12-26 DIAGNOSIS — R0902 Hypoxemia: Secondary | ICD-10-CM | POA: Diagnosis not present

## 2020-12-26 DIAGNOSIS — E785 Hyperlipidemia, unspecified: Secondary | ICD-10-CM | POA: Diagnosis present

## 2020-12-26 DIAGNOSIS — I255 Ischemic cardiomyopathy: Secondary | ICD-10-CM | POA: Diagnosis present

## 2020-12-26 DIAGNOSIS — Z880 Allergy status to penicillin: Secondary | ICD-10-CM

## 2020-12-26 DIAGNOSIS — R7989 Other specified abnormal findings of blood chemistry: Secondary | ICD-10-CM | POA: Diagnosis present

## 2020-12-26 DIAGNOSIS — E871 Hypo-osmolality and hyponatremia: Secondary | ICD-10-CM | POA: Diagnosis present

## 2020-12-26 DIAGNOSIS — Z95810 Presence of automatic (implantable) cardiac defibrillator: Secondary | ICD-10-CM | POA: Diagnosis not present

## 2020-12-26 DIAGNOSIS — Z79899 Other long term (current) drug therapy: Secondary | ICD-10-CM

## 2020-12-26 DIAGNOSIS — Z8673 Personal history of transient ischemic attack (TIA), and cerebral infarction without residual deficits: Secondary | ICD-10-CM | POA: Diagnosis not present

## 2020-12-26 DIAGNOSIS — W19XXXA Unspecified fall, initial encounter: Secondary | ICD-10-CM | POA: Diagnosis present

## 2020-12-26 DIAGNOSIS — D72829 Elevated white blood cell count, unspecified: Secondary | ICD-10-CM | POA: Diagnosis present

## 2020-12-26 DIAGNOSIS — Z885 Allergy status to narcotic agent status: Secondary | ICD-10-CM

## 2020-12-26 DIAGNOSIS — R0603 Acute respiratory distress: Secondary | ICD-10-CM | POA: Diagnosis present

## 2020-12-26 DIAGNOSIS — Z87891 Personal history of nicotine dependence: Secondary | ICD-10-CM

## 2020-12-26 DIAGNOSIS — Z7951 Long term (current) use of inhaled steroids: Secondary | ICD-10-CM

## 2020-12-26 DIAGNOSIS — R0602 Shortness of breath: Secondary | ICD-10-CM | POA: Diagnosis present

## 2020-12-26 DIAGNOSIS — I130 Hypertensive heart and chronic kidney disease with heart failure and stage 1 through stage 4 chronic kidney disease, or unspecified chronic kidney disease: Secondary | ICD-10-CM | POA: Diagnosis present

## 2020-12-26 DIAGNOSIS — F0390 Unspecified dementia without behavioral disturbance: Secondary | ICD-10-CM | POA: Diagnosis present

## 2020-12-26 DIAGNOSIS — I5023 Acute on chronic systolic (congestive) heart failure: Secondary | ICD-10-CM | POA: Diagnosis present

## 2020-12-26 DIAGNOSIS — R531 Weakness: Secondary | ICD-10-CM | POA: Diagnosis present

## 2020-12-26 DIAGNOSIS — J441 Chronic obstructive pulmonary disease with (acute) exacerbation: Secondary | ICD-10-CM | POA: Diagnosis present

## 2020-12-26 DIAGNOSIS — S20212A Contusion of left front wall of thorax, initial encounter: Secondary | ICD-10-CM | POA: Diagnosis present

## 2020-12-26 DIAGNOSIS — R55 Syncope and collapse: Secondary | ICD-10-CM | POA: Diagnosis not present

## 2020-12-26 DIAGNOSIS — I480 Paroxysmal atrial fibrillation: Secondary | ICD-10-CM | POA: Diagnosis present

## 2020-12-26 DIAGNOSIS — N1832 Chronic kidney disease, stage 3b: Secondary | ICD-10-CM | POA: Diagnosis present

## 2020-12-26 DIAGNOSIS — G4733 Obstructive sleep apnea (adult) (pediatric): Secondary | ICD-10-CM | POA: Diagnosis present

## 2020-12-26 DIAGNOSIS — I10 Essential (primary) hypertension: Secondary | ICD-10-CM | POA: Diagnosis not present

## 2020-12-26 DIAGNOSIS — I110 Hypertensive heart disease with heart failure: Secondary | ICD-10-CM | POA: Diagnosis not present

## 2020-12-26 DIAGNOSIS — I2510 Atherosclerotic heart disease of native coronary artery without angina pectoris: Secondary | ICD-10-CM | POA: Diagnosis present

## 2020-12-26 DIAGNOSIS — Z8249 Family history of ischemic heart disease and other diseases of the circulatory system: Secondary | ICD-10-CM

## 2020-12-26 DIAGNOSIS — I509 Heart failure, unspecified: Secondary | ICD-10-CM | POA: Diagnosis not present

## 2020-12-26 DIAGNOSIS — E7800 Pure hypercholesterolemia, unspecified: Secondary | ICD-10-CM | POA: Diagnosis not present

## 2020-12-26 DIAGNOSIS — R059 Cough, unspecified: Secondary | ICD-10-CM | POA: Diagnosis not present

## 2020-12-26 NOTE — ED Triage Notes (Signed)
Pt arriving via [facility] from home with SOB, congestion, and productive cough. Pt has hx of COPD. Reports falling 2 weeks ago and hit the left side of his chest. Pt has bruising on left chest and is on blood thinners. Denies pain at this time.

## 2020-12-27 ENCOUNTER — Inpatient Hospital Stay (HOSPITAL_COMMUNITY)
Admission: EM | Admit: 2020-12-27 | Discharge: 2020-12-29 | DRG: 291 | Disposition: A | Discharge status: Home Health | Payer: HMO | Attending: Internal Medicine | Admitting: Internal Medicine

## 2020-12-27 ENCOUNTER — Emergency Department (HOSPITAL_COMMUNITY): Payer: HMO

## 2020-12-27 DIAGNOSIS — R531 Weakness: Secondary | ICD-10-CM | POA: Diagnosis present

## 2020-12-27 DIAGNOSIS — R7989 Other specified abnormal findings of blood chemistry: Secondary | ICD-10-CM | POA: Diagnosis present

## 2020-12-27 DIAGNOSIS — R0600 Dyspnea, unspecified: Secondary | ICD-10-CM | POA: Diagnosis present

## 2020-12-27 DIAGNOSIS — S20212A Contusion of left front wall of thorax, initial encounter: Secondary | ICD-10-CM | POA: Diagnosis present

## 2020-12-27 DIAGNOSIS — J441 Chronic obstructive pulmonary disease with (acute) exacerbation: Secondary | ICD-10-CM | POA: Diagnosis present

## 2020-12-27 DIAGNOSIS — W19XXXA Unspecified fall, initial encounter: Secondary | ICD-10-CM | POA: Diagnosis present

## 2020-12-27 DIAGNOSIS — E7800 Pure hypercholesterolemia, unspecified: Secondary | ICD-10-CM | POA: Diagnosis not present

## 2020-12-27 DIAGNOSIS — I10 Essential (primary) hypertension: Secondary | ICD-10-CM | POA: Diagnosis present

## 2020-12-27 DIAGNOSIS — K219 Gastro-esophageal reflux disease without esophagitis: Secondary | ICD-10-CM | POA: Diagnosis present

## 2020-12-27 DIAGNOSIS — E785 Hyperlipidemia, unspecified: Secondary | ICD-10-CM | POA: Diagnosis present

## 2020-12-27 DIAGNOSIS — I130 Hypertensive heart and chronic kidney disease with heart failure and stage 1 through stage 4 chronic kidney disease, or unspecified chronic kidney disease: Secondary | ICD-10-CM | POA: Diagnosis present

## 2020-12-27 DIAGNOSIS — R0602 Shortness of breath: Secondary | ICD-10-CM | POA: Diagnosis present

## 2020-12-27 DIAGNOSIS — I255 Ischemic cardiomyopathy: Secondary | ICD-10-CM | POA: Diagnosis present

## 2020-12-27 DIAGNOSIS — I480 Paroxysmal atrial fibrillation: Secondary | ICD-10-CM | POA: Diagnosis present

## 2020-12-27 DIAGNOSIS — J449 Chronic obstructive pulmonary disease, unspecified: Secondary | ICD-10-CM | POA: Diagnosis present

## 2020-12-27 DIAGNOSIS — I5023 Acute on chronic systolic (congestive) heart failure: Secondary | ICD-10-CM | POA: Diagnosis present

## 2020-12-27 DIAGNOSIS — Z8674 Personal history of sudden cardiac arrest: Secondary | ICD-10-CM | POA: Diagnosis not present

## 2020-12-27 DIAGNOSIS — R0603 Acute respiratory distress: Secondary | ICD-10-CM

## 2020-12-27 DIAGNOSIS — I2510 Atherosclerotic heart disease of native coronary artery without angina pectoris: Secondary | ICD-10-CM | POA: Diagnosis present

## 2020-12-27 DIAGNOSIS — Z20822 Contact with and (suspected) exposure to covid-19: Secondary | ICD-10-CM | POA: Diagnosis present

## 2020-12-27 DIAGNOSIS — D72829 Elevated white blood cell count, unspecified: Secondary | ICD-10-CM | POA: Diagnosis present

## 2020-12-27 DIAGNOSIS — Z95810 Presence of automatic (implantable) cardiac defibrillator: Secondary | ICD-10-CM | POA: Diagnosis not present

## 2020-12-27 DIAGNOSIS — I509 Heart failure, unspecified: Secondary | ICD-10-CM | POA: Diagnosis not present

## 2020-12-27 DIAGNOSIS — Z955 Presence of coronary angioplasty implant and graft: Secondary | ICD-10-CM | POA: Diagnosis not present

## 2020-12-27 DIAGNOSIS — N184 Chronic kidney disease, stage 4 (severe): Secondary | ICD-10-CM | POA: Diagnosis present

## 2020-12-27 DIAGNOSIS — N1832 Chronic kidney disease, stage 3b: Secondary | ICD-10-CM | POA: Diagnosis present

## 2020-12-27 DIAGNOSIS — G4733 Obstructive sleep apnea (adult) (pediatric): Secondary | ICD-10-CM | POA: Diagnosis present

## 2020-12-27 DIAGNOSIS — N179 Acute kidney failure, unspecified: Secondary | ICD-10-CM | POA: Diagnosis present

## 2020-12-27 DIAGNOSIS — Z951 Presence of aortocoronary bypass graft: Secondary | ICD-10-CM | POA: Diagnosis not present

## 2020-12-27 DIAGNOSIS — I169 Hypertensive crisis, unspecified: Secondary | ICD-10-CM | POA: Diagnosis present

## 2020-12-27 DIAGNOSIS — F0390 Unspecified dementia without behavioral disturbance: Secondary | ICD-10-CM | POA: Diagnosis present

## 2020-12-27 DIAGNOSIS — Z8673 Personal history of transient ischemic attack (TIA), and cerebral infarction without residual deficits: Secondary | ICD-10-CM | POA: Diagnosis not present

## 2020-12-27 DIAGNOSIS — R55 Syncope and collapse: Secondary | ICD-10-CM | POA: Diagnosis not present

## 2020-12-27 DIAGNOSIS — N183 Chronic kidney disease, stage 3 unspecified: Secondary | ICD-10-CM | POA: Diagnosis present

## 2020-12-27 DIAGNOSIS — E871 Hypo-osmolality and hyponatremia: Secondary | ICD-10-CM | POA: Diagnosis present

## 2020-12-27 HISTORY — DX: Acute respiratory distress: R06.03

## 2020-12-27 LAB — CBC WITH DIFFERENTIAL/PLATELET
Abs Immature Granulocytes: 0.24 10*3/uL — ABNORMAL HIGH (ref 0.00–0.07)
Basophils Absolute: 0 10*3/uL (ref 0.0–0.1)
Basophils Relative: 0 %
Eosinophils Absolute: 0.2 10*3/uL (ref 0.0–0.5)
Eosinophils Relative: 2 %
HCT: 37.2 % — ABNORMAL LOW (ref 39.0–52.0)
Hemoglobin: 12.1 g/dL — ABNORMAL LOW (ref 13.0–17.0)
Immature Granulocytes: 2 %
Lymphocytes Relative: 4 %
Lymphs Abs: 0.5 10*3/uL — ABNORMAL LOW (ref 0.7–4.0)
MCH: 30 pg (ref 26.0–34.0)
MCHC: 32.5 g/dL (ref 30.0–36.0)
MCV: 92.3 fL (ref 80.0–100.0)
Monocytes Absolute: 0.8 10*3/uL (ref 0.1–1.0)
Monocytes Relative: 7 %
Neutro Abs: 9.2 10*3/uL — ABNORMAL HIGH (ref 1.7–7.7)
Neutrophils Relative %: 85 %
Platelets: 190 10*3/uL (ref 150–400)
RBC: 4.03 MIL/uL — ABNORMAL LOW (ref 4.22–5.81)
RDW: 15.6 % — ABNORMAL HIGH (ref 11.5–15.5)
WBC: 10.8 10*3/uL — ABNORMAL HIGH (ref 4.0–10.5)
nRBC: 0 % (ref 0.0–0.2)

## 2020-12-27 LAB — BASIC METABOLIC PANEL
Anion gap: 13 (ref 5–15)
BUN: 65 mg/dL — ABNORMAL HIGH (ref 8–23)
CO2: 20 mmol/L — ABNORMAL LOW (ref 22–32)
Calcium: 9.4 mg/dL (ref 8.9–10.3)
Chloride: 99 mmol/L (ref 98–111)
Creatinine, Ser: 2.95 mg/dL — ABNORMAL HIGH (ref 0.61–1.24)
GFR, Estimated: 21 mL/min — ABNORMAL LOW (ref >60)
Glucose, Bld: 124 mg/dL — ABNORMAL HIGH (ref 70–99)
Potassium: 3.9 mmol/L (ref 3.5–5.1)
Sodium: 132 mmol/L — ABNORMAL LOW (ref 135–145)

## 2020-12-27 LAB — SARS CORONAVIRUS 2 (TAT 6-24 HRS): SARS Coronavirus 2: NEGATIVE

## 2020-12-27 LAB — PROTIME-INR
INR: 1.6 — ABNORMAL HIGH (ref 0.8–1.2)
Prothrombin Time: 18.2 seconds — ABNORMAL HIGH (ref 11.4–15.2)

## 2020-12-27 LAB — TROPONIN I (HIGH SENSITIVITY)
Troponin I (High Sensitivity): 59 ng/L — ABNORMAL HIGH (ref <18)
Troponin I (High Sensitivity): 41 ng/L — ABNORMAL HIGH (ref <18)

## 2020-12-27 LAB — POC SARS CORONAVIRUS 2 AG -  ED: SARS Coronavirus 2 Ag: NEGATIVE

## 2020-12-27 LAB — BRAIN NATRIURETIC PEPTIDE: B Natriuretic Peptide: 266.2 pg/mL — ABNORMAL HIGH (ref 0.0–100.0)

## 2020-12-27 MED ORDER — IPRATROPIUM-ALBUTEROL 0.5-2.5 (3) MG/3ML IN SOLN
3.0000 mL | Freq: Four times a day (QID) | RESPIRATORY_TRACT | Status: DC
  Administered 2020-12-27: 3 mL via RESPIRATORY_TRACT
  Filled 2020-12-27: qty 3

## 2020-12-27 MED ORDER — FLUTICASONE-UMECLIDIN-VILANT 100-62.5-25 MCG/INH IN AEPB: 1.0000 | INHALATION_SPRAY | Freq: Every morning | RESPIRATORY_TRACT | Status: DC

## 2020-12-27 MED ORDER — DOXYCYCLINE HYCLATE 100 MG PO TABS
100.0000 mg | ORAL_TABLET | Freq: Two times a day (BID) | ORAL | Status: DC
  Administered 2020-12-27 – 2020-12-29 (×5): 100 mg via ORAL
  Filled 2020-12-27 (×5): qty 1

## 2020-12-27 MED ORDER — IPRATROPIUM-ALBUTEROL 0.5-2.5 (3) MG/3ML IN SOLN
3.0000 mL | Freq: Two times a day (BID) | RESPIRATORY_TRACT | Status: DC
  Administered 2020-12-28 – 2020-12-29 (×3): 3 mL via RESPIRATORY_TRACT
  Filled 2020-12-27 (×3): qty 3

## 2020-12-27 MED ORDER — UMECLIDINIUM BROMIDE 62.5 MCG/INH IN AEPB
1.0000 | INHALATION_SPRAY | Freq: Every day | RESPIRATORY_TRACT | Status: DC
  Administered 2020-12-28 – 2020-12-29 (×2): 1 via RESPIRATORY_TRACT
  Filled 2020-12-27: qty 7

## 2020-12-27 MED ORDER — LABETALOL HCL 200 MG PO TABS
300.0000 mg | ORAL_TABLET | Freq: Two times a day (BID) | ORAL | Status: DC
  Administered 2020-12-27 – 2020-12-29 (×5): 300 mg via ORAL
  Filled 2020-12-27 (×5): qty 1

## 2020-12-27 MED ORDER — AMIODARONE HCL 200 MG PO TABS
200.0000 mg | ORAL_TABLET | ORAL | Status: DC
  Administered 2020-12-28 – 2020-12-29 (×2): 200 mg via ORAL
  Filled 2020-12-27 (×2): qty 1

## 2020-12-27 MED ORDER — PREDNISONE 20 MG PO TABS
60.0000 mg | ORAL_TABLET | Freq: Once | ORAL | Status: AC
  Administered 2020-12-27: 60 mg via ORAL
  Filled 2020-12-27: qty 3

## 2020-12-27 MED ORDER — NITROGLYCERIN 0.4 MG SL SUBL: 0.4000 mg | SUBLINGUAL_TABLET | SUBLINGUAL | Status: DC | PRN

## 2020-12-27 MED ORDER — FLUTICASONE FUROATE-VILANTEROL 100-25 MCG/INH IN AEPB
1.0000 | INHALATION_SPRAY | Freq: Every day | RESPIRATORY_TRACT | Status: DC
  Administered 2020-12-28 – 2020-12-29 (×2): 1 via RESPIRATORY_TRACT
  Filled 2020-12-27: qty 28

## 2020-12-27 MED ORDER — ALBUTEROL SULFATE (2.5 MG/3ML) 0.083% IN NEBU: 2.5000 mg | INHALATION_SOLUTION | RESPIRATORY_TRACT | Status: DC | PRN

## 2020-12-27 MED ORDER — SODIUM CHLORIDE 0.9% FLUSH
3.0000 mL | Freq: Two times a day (BID) | INTRAVENOUS | Status: DC
  Administered 2020-12-27 – 2020-12-29 (×4): 3 mL via INTRAVENOUS

## 2020-12-27 MED ORDER — ATORVASTATIN CALCIUM 40 MG PO TABS
40.0000 mg | ORAL_TABLET | Freq: Every day | ORAL | Status: DC
  Administered 2020-12-27 – 2020-12-29 (×3): 40 mg via ORAL
  Filled 2020-12-27 (×3): qty 1

## 2020-12-27 MED ORDER — APIXABAN 5 MG PO TABS
5.0000 mg | ORAL_TABLET | Freq: Two times a day (BID) | ORAL | Status: DC
  Administered 2020-12-27 – 2020-12-29 (×5): 5 mg via ORAL
  Filled 2020-12-27 (×2): qty 1
  Filled 2020-12-27: qty 2
  Filled 2020-12-27 (×2): qty 1

## 2020-12-27 MED ORDER — FUROSEMIDE 10 MG/ML IJ SOLN
40.0000 mg | Freq: Two times a day (BID) | INTRAMUSCULAR | Status: DC
  Administered 2020-12-27 – 2020-12-29 (×4): 40 mg via INTRAVENOUS
  Filled 2020-12-27 (×4): qty 4

## 2020-12-27 MED ORDER — PREDNISONE 20 MG PO TABS
40.0000 mg | ORAL_TABLET | Freq: Every day | ORAL | Status: DC
  Administered 2020-12-28 – 2020-12-29 (×2): 40 mg via ORAL
  Filled 2020-12-27 (×2): qty 2

## 2020-12-27 MED ORDER — FUROSEMIDE 10 MG/ML IJ SOLN
40.0000 mg | Freq: Once | INTRAMUSCULAR | Status: AC
  Administered 2020-12-27: 40 mg via INTRAVENOUS
  Filled 2020-12-27: qty 4

## 2020-12-27 NOTE — ED Notes (Signed)
Pt provided with dinner tray.

## 2020-12-27 NOTE — ED Notes (Signed)
Condom cath placed on pt 

## 2020-12-27 NOTE — ED Notes (Signed)
[id number] Joe Wiggins

## 2020-12-27 NOTE — ED Provider Notes (Signed)
[facility] DEPT Provider Note   CSN: [id number] Arrival date & time: 12/26/20  2002     History Chief Complaint  Patient presents with  . Shortness of Breath    Joe Wiggins is a 75 y.o. male.  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  He uses oxygen at night.  He had a fall a few weeks ago and has some bruising on his left chest.  He is on Eliquis.  He said he felt well yesterday but ate late last evening.  Went to bed and then woke up feeling short of breath.  Called 911 and fire came out to check on him.  Ultimately he went back to bed but woke up feeling short of breath again and so called 911 and was transported here.  He says he feels somewhat better but not back to baseline.  Currently on 2 L satting 98%.  No cough.  Some edema on his legs which he says he gets time to time.  No chest pain.  No fever.  Has been Covid vaccinated and boosted.  No sick contacts.  The history is provided by the patient.  Shortness of Breath Severity:  Moderate Onset quality:  Unable to specify Timing:  Constant Progression:  Improving Chronicity:  Recurrent Relieved by:  None tried Worsened by:  Nothing Ineffective treatments:  None tried Associated symptoms: no abdominal pain, no chest pain, no cough, no diaphoresis, no fever, no headaches, no hemoptysis, no neck pain, no rash, no sore throat, no sputum production and no vomiting        Past Medical History:  Diagnosis Date  . AF (paroxysmal atrial fibrillation) ([place])   . AICD (automatic cardioverter/defibrillator) present   . CAD (coronary artery disease)   . Cardiomyopathy, ischemic   . Carotid artery occlusion   . Chronic kidney disease   . COPD (chronic obstructive pulmonary disease) ([place])   . Dementia without behavioral disturbance ([place])   . GERD (gastroesophageal reflux disease)   . Hyperlipidemia   . Hypertension   . Sleep apnea   . Stroke ([place])   . TIA (transient ischemic attack)      Patient Active Problem List   Diagnosis Date Noted  . Venous insufficiency 05/27/2020  . Sinus node dysfunction ([place]) 01/23/2020  . Gout 01/21/2020  . Cough 11/13/2019  . Hypertension 10/30/2019  . Abrasion of forearm, right 07/27/2019  . Facial rash 08/15/2018  . Orthostatic hypotension 03/25/2018  . Bradycardia 03/25/2018  . History of cardiac arrest 03/25/2018  . Vascular dementia without behavioral disturbance ([place]) 03/25/2018  . Seasonal allergies 03/14/2018  . Pre-syncope 01/16/2018  . ICD (implantable cardioverter-defibrillator) in place   . Physical deconditioning   . Acute respiratory failure ([place])   . Cardiac arrest ([place])   . CAD (coronary artery disease), native coronary artery 01/20/2016  . Long term current use of anticoagulant therapy 01/20/2016  . History of TIA (transient ischemic attack) and stroke 01/20/2016  . Sleep apnea   . Cardiac pacemaker in situ   . Dizziness 01/17/2016  . Anemia 01/17/2016  . Bilateral carotid artery stenosis   . Hyperlipidemia   . Dementia without behavioral disturbance ([place])   . Hypertensive heart disease without CHF   . Paroxysmal atrial fibrillation (H[facility])   . Chronic kidney disease, stage 3 ([place])   . COPD exacerbation [facility])     Past Surgical History:  Procedure Laterality Date  . CARDIAC PACEMAKER PLACEMENT    .  CATARACT EXTRACTION    . CORONARY ARTERY BYPASS GRAFT    . CORONARY STENT PLACEMENT    . EYE SURGERY    . ICD IMPLANT N/A 12/29/2017   Procedure: ICD IMPLANT;  Surgeon: Evans Lance, MD;  Location: [facility] CV LAB;  Service: Cardiovascular;  Laterality: N/A;  . LEFT HEART CATH AND CORS/GRAFTS ANGIOGRAPHY N/A 12/29/2017   Procedure: LEFT HEART CATH AND CORS/GRAFTS ANGIOGRAPHY;  Surgeon: Martinique, Peter M, MD;  Location: [facility];  Service: Cardiovascular;  Laterality: N/A;       Family History  Problem Relation Age of Onset  . Hypertension Mother   . Heart disease Mother   . Hypertension Sister    . Heart disease Sister   . Thyroid disease Sister   . Thyroid disease Brother        hyperthyroid    Social History   Tobacco Use  . Smoking status: Former Smoker    Types: Cigarettes    Quit date: 11/30/2012    Years since quitting: 8.0  . Smokeless tobacco: Never Used  Vaping Use  . Vaping Use: Never used  Substance Use Topics  . Alcohol use: No  . Drug use: No    Home Medications Prior to Admission medications   Medication Sig Start Date End Date Taking? Authorizing Provider  albuterol (ACCUNEB) 0.63 MG/3ML nebulizer solution 3 ml inhale orally via nebulizer four times a day    [provider]  albuterol (PROVENTIL HFA;VENTOLIN HFA) 108 (90 Base) MCG/ACT inhaler Inhale 2 puffs into the lungs every 6 (six) hours as needed.  06/30/16   [provider]  amiodarone (PACERONE) 200 MG tablet Take 1 tablet by mouth daily Monday through Saturday. Do not take on Sunday. 11/04/20   Evans Lance, MD  apixaban (ELIQUIS) 5 MG TABS tablet Take 1 tablet (5 mg total) by mouth 2 (two) times daily. 09/24/20   Evans Lance, MD  ascorbic acid (VITAMIN C) 500 MG tablet Take 500 mg by mouth daily.    [provider]  atorvastatin (LIPITOR) 40 MG tablet Take 1 tablet (40 mg total) by mouth daily. 02/14/20   Evans Lance, MD  cholecalciferol (VITAMIN D) 1000 units tablet Take 1,000 Units by mouth daily.    [provider]  Cyanocobalamin (VITAMIN B 12 PO) Take by mouth daily.    [provider]  ferrous sulfate 325 (65 FE) MG tablet Take 325 mg by mouth daily with breakfast. 01/03/18   [provider]  Fluticasone-Umeclidin-Vilant (TRELEGY ELLIPTA) 100-62.5-25 MCG/INH AEPB Inhale 1 puff into the lungs in the morning.    [provider]  folic acid (FOLVITE) 947 MCG tablet Take 400 mcg by mouth daily.    [provider]  hydrALAZINE (APRESOLINE) 50 MG tablet Take 1 tablet (50 mg total) by mouth 3 (three) times daily. 09/09/20  12/08/20  Shirley Friar, PA-C  labetalol (NORMODYNE) 300 MG tablet Take 1 tablet (300 mg total) by mouth 2 (two) times daily. 10/30/20   Evans Lance, MD  Multiple Vitamin (MULTIVITAMIN) tablet Take 1 tablet by mouth daily.    [provider]  nitroGLYCERIN (NITROSTAT) 0.4 MG SL tablet Place 1 tablet (0.4 mg total) under the tongue every 5 (five) minutes as needed for chest pain. 11/02/20   Evans Lance, MD  torsemide [facility]) 20 MG tablet Take 2 tablets in the AM and 2 tablets in the PM 07/31/20   Shirley Friar, PA-C  Allergies    Ipratropium, Metoprolol, Morphine and related, Vicodin [hydrocodone-acetaminophen], and Penicillins  Review of Systems   Review of Systems  Constitutional: Negative for diaphoresis and fever.  HENT: Negative for sore throat.   Eyes: Negative for visual disturbance.  Respiratory: Positive for shortness of breath. Negative for cough, hemoptysis and sputum production.   Cardiovascular: Positive for leg swelling. Negative for chest pain.  Gastrointestinal: Negative for abdominal pain and vomiting.  Genitourinary: Negative for dysuria.  Musculoskeletal: Negative for neck pain.  Skin: Negative for rash.  Neurological: Negative for headaches.    Physical Exam Updated Vital Signs BP (!) 147/63 (BP Location: Left Arm)   Pulse 60   Temp 98.9 F (37.2 C) (Oral)   Resp 18   SpO2 98%   Physical Exam Vitals and nursing note reviewed.  Constitutional:      Appearance: He is well-developed and well-nourished.  HENT:     Head: Normocephalic and atraumatic.  Eyes:     Conjunctiva/sclera: Conjunctivae normal.  Cardiovascular:     Rate and Rhythm: Normal rate and regular rhythm.     Heart sounds: No murmur heard.   Pulmonary:     Effort: Pulmonary effort is normal. No respiratory distress.     Breath sounds: Normal breath sounds.  Chest:     Comments: Bruising left lower anterior rib cage no crepitus Abdominal:      Palpations: Abdomen is soft.     Tenderness: There is no abdominal tenderness.  Musculoskeletal:     Cervical back: Neck supple.     Right lower leg: No tenderness. Edema present.     Left lower leg: No tenderness. Edema present.     Comments: 1+ lower extremity edema bilaterally symmetric no cords  Skin:    General: Skin is warm and dry.     Capillary Refill: Capillary refill takes less than 2 seconds.  Neurological:     General: No focal deficit present.     Mental Status: He is alert.     GCS: GCS eye subscore is 4. GCS verbal subscore is 5. GCS motor subscore is 6.  Psychiatric:        Mood and Affect: Mood and affect normal.     ED Results / Procedures / Treatments   Labs (all labs ordered are listed, but only abnormal results are displayed) Labs Reviewed  CBC WITH DIFFERENTIAL/PLATELET - Abnormal; Notable for the following components:      Result Value   WBC 10.8 (*)    RBC 4.03 (*)    Hemoglobin 12.1 (*)    HCT 37.2 (*)    RDW 15.6 (*)    Neutro Abs 9.2 (*)    Lymphs Abs 0.5 (*)    Abs Immature Granulocytes 0.24 (*)    All other components within normal limits  BASIC METABOLIC PANEL - Abnormal; Notable for the following components:   Sodium 132 (*)    CO2 20 (*)    Glucose, Bld 124 (*)    BUN 65 (*)    Creatinine, Ser 2.95 (*)    GFR, Estimated 21 (*)    All other components within normal limits  BRAIN NATRIURETIC PEPTIDE - Abnormal; Notable for the following components:   B Natriuretic Peptide 266.2 (*)    All other components within normal limits  PROTIME-INR - Abnormal; Notable for the following components:   Prothrombin Time 18.2 (*)    INR 1.6 (*)    All other components within normal limits  TROPONIN  I (HIGH SENSITIVITY) - Abnormal; Notable for the following components:   Troponin I (High Sensitivity) 59 (*)    All other components within normal limits  POC SARS CORONAVIRUS 2 AG -  ED    EKG EKG Interpretation  Date/Time:  Sunday December 27 2020  03:16:53 EST Ventricular Rate:  62 PR Interval:    QRS Duration: 147 QT Interval:  471 QTC Calculation: 479 R Axis:   -29 Text Interpretation: Sinus rhythm Left bundle branch block Baseline wander in lead(s) V3 Missing lead(s): V2 12 Lead; [place] Confirmed by Pryor Curia [phone]) on 12/27/2020 3:35:33 AM   Radiology DG Ribs Unilateral W/Chest Left  Result Date: 12/27/2020 CLINICAL DATA:  Cough and fall EXAM: LEFT RIBS AND CHEST - 3+ VIEW COMPARISON:  None. FINDINGS: No fracture or other bone lesions are seen involving the ribs, however somewhat limited. There is a small right pleural effusion. Increased interstitial markings are seen at both lung bases. There is no evidence of pneumothorax or pleural effusion. Both lungs are clear. Heart size and mediastinal contours are within normal limits. IMPRESSION: No definite displaced fractures, however somewhat limited. Small left pleural effusion. Mildly increased interstitial markings which may be due to chronic lung changes versus interstitial edema. Electronically Signed   By: Prudencio Pair M.D.   On: 12/27/2020 00:47    Procedures Procedures (including critical care time)  Medications Ordered in ED Medications  doxycycline (VIBRA-TABS) tablet 100 mg (100 mg Oral Given 12/27/20 2124)  amiodarone (PACERONE) tablet 200 mg (has no administration in time range)  atorvastatin (LIPITOR) tablet 40 mg (40 mg Oral Given 12/27/20 1529)  labetalol (NORMODYNE) tablet 300 mg (300 mg Oral Given 12/27/20 2124)  nitroGLYCERIN (NITROSTAT) SL tablet 0.4 mg (has no administration in time range)  apixaban (ELIQUIS) tablet 5 mg (5 mg Oral Given 12/27/20 2125)  predniSONE (DELTASONE) tablet 40 mg (has no administration in time range)  albuterol (PROVENTIL) (2.5 MG/3ML) 0.083% nebulizer solution 2.5 mg (has no administration in time range)  furosemide (LASIX) injection 40 mg (40 mg Intravenous Given 12/27/20 1747)  sodium chloride flush (NS) 0.9 % injection 3 mL (3 mLs  Intravenous Given 12/27/20 2125)  fluticasone furoate-vilanterol (BREO ELLIPTA) 100-25 MCG/INH 1 puff (1 puff Inhalation Given 12/28/20 0910)  umeclidinium bromide (INCRUSE ELLIPTA) 62.5 MCG/INH 1 puff (1 puff Inhalation Given 12/28/20 0910)  ipratropium-albuterol (DUONEB) 0.5-2.5 (3) MG/3ML nebulizer solution 3 mL (3 mLs Nebulization Given 12/28/20 0908)  MEDLINE mouth rinse (15 mLs Mouth Rinse Not Given 12/28/20 0432)  predniSONE (DELTASONE) tablet 60 mg (60 mg Oral Given 12/27/20 0838)  furosemide (LASIX) injection 40 mg (40 mg Intravenous Given 12/27/20 1001)    ED Course  I have reviewed the triage vital signs and the nursing notes.  Pertinent labs & imaging results that were available during my care of the patient were reviewed by me and considered in my medical decision making (see chart for details).  Clinical Course as of 12/28/20 1017  Sun Dec 27, 2020  6270 Patient states he is feeling better but when he ambulated to the bathroom and came back he was winded and his sats were at 81%.  Placed back on oxygen.  We will try some IV diuresis. [MB]  3500 Ambulated patient in department.  Sats dropped to 84% and became tachypneic.  He is agreeable to admission. [MB]  9381 Discussed with Dr. Neysa Bonito [facility] hospitalist who will evaluate the patient for admission. [MB]    Clinical Course User Index [MB] Aletta Edouard  C, MD   MDM Rules/Calculators/A&P                         Joe Wiggins was evaluated in Emergency Department on 12/27/2020 for the symptoms described in the history of present illness. He was evaluated in the context of the global COVID-19 pandemic, which necessitated consideration that the patient might be at risk for infection with the SARS-CoV-2 virus that causes COVID-19. Institutional protocols and algorithms that pertain to the evaluation of patients at risk for COVID-19 are in a state of rapid change based on information released by regulatory bodies including the CDC and federal and state  organizations. These policies and algorithms were followed during the patient's care in the ED.  This patient complains of shortness of breath dyspnea on exertion orthopnea; this involves an extensive number of treatment Options and is a complaint that carries with it a high risk of complications and Morbidity. The differential includes CHF, COPD, anemia, arrhythmia, ACS, PE, pneumonia, Covid  I ordered, reviewed and interpreted labs, which included CBC with mildly elevated white count, stable hemoglobin, chemistries with low bicarb elevated BUN and creatinine reflecting some dehydration, troponin elevated but not rising, low INR markedly elevated-redrawn and now consistent with being on a DOAC.  BNP elevated but has been higher in the past I ordered medication oxygen, oral prednisone, IV Lasix I ordered imaging studies which included chest x-ray and I independently    visualized and interpreted imaging which showed no rib fractures or pneumothorax, small effusion, interstitial edema Previous records obtained and reviewed in epic, no recent admissions Addition information provided by patient's wife I consulted Dr. Neysa Bonito and discussed lab and imaging findings  Critical Interventions: None  After the interventions stated above, I reevaluated the patient and found patient still to be requiring oxygen and very dyspneic with any type of ambulation.  He is agreeable to admission to the hospital for continued management of this.  CHA2DS2/VAS Stroke Risk Points  Current as of 46 minutes ago     5 >= 2 Points: High Risk  1 - 1.99 Points: Medium Risk  0 Points: Low Risk    No Change      Details    This score determines the patient's risk of having a stroke if the  patient has atrial fibrillation.       Points Metrics  1 Has Congestive Heart Failure:  Yes    Current as of 46 minutes ago  1 Has Vascular Disease:  Yes    Current as of 46 minutes ago  1 Has Hypertension:  Yes    Current as of  46 minutes ago  2 Age:  24    Current as of 46 minutes ago  0 Has Diabetes:  No    Current as of 46 minutes ago  0 Had Stroke:  No  Had TIA:  No  Had Thromboembolism:  No    Current as of 46 minutes ago  0 Male:  No    Current as of 46 minutes ago           Final Clinical Impression(s) / ED Diagnoses Final diagnoses:  Acute on chronic congestive heart failure, unspecified heart failure type ([place])  AKI (acute kidney injury) ([place])    Rx / DC Orders ED Discharge Orders    None       Hayden Rasmussen, MD 12/28/20 1021

## 2020-12-27 NOTE — ED Notes (Signed)
Patient ambulated to restroom with this RN on room air. Patient O2 saturation 80% with ambulation. Patient visibly short of breath. Placed back on oxygen at this time.

## 2020-12-27 NOTE — H&P (Signed)
History and Physical        Hospital Admission Note Date: 12/27/2020  [name] Medical record number: [id number] Date of birth: 12/08/1946 Age: 75 y.o. Gender: male  PCP: Ezequiel Essex, MD    Chief Complaint    Chief Complaint  Patient presents with  . Shortness of Breath      HPI:   Patient is a poor historian due to poor understanding of his health care and deferred history to his wife whom I called over the phone  This is a 75 year old male who has been vaccinated against COVID-19 with a past medical history of COPD on as needed O2 nightly, ischemic cardiomyopathy, AICD, atrial fibrillation on Eliquis and amiodarone, hypertension, hyperlipidemia, OSA, CKD 3B, recent fall, CVA who presented to the ED with shortness of breath x1 week which has worsened over the past day or so.  Patient reports that he fell on 12/12 and does not recall losing consciousness or hitting his head.  Per review of prior notes, patient's ICD had gone off at that time but patient did not feel the defibrillation.  Since that time he has been feeling okay with some soreness of his ribs.  Wife reports 1 week after the fall he became short of breath with a cough and notified his pulmonologist, Dr. Camillo Flaming, at [facility] who started him on prednisone and Levaquin on 12/21.  Per notes in care everywhere the patient had a persistent cough on 12/27 but improvement in his COPD exacerbation and was started on omeprazole and Tessalon Perles as needed.  Patient reports improved symptoms since that point though his wife states that since that time his shortness of breath has continued and progressed and last night the patient went to bed then woke up feeling very short of breath and contacted 911.  Initially he declined to go to the hospital however he woke up feeling short of breath once again, contacted EMS and was  transported to the ED.  Currently, the patient had just come back from the bathroom and was clearly dyspneic.  He had a hard time providing the story due to conversational dyspnea.  Denies any chest pain but admitted to some wheezing and leg swelling, left greater than right.  Admits to cough but denies any fevers.  ED Course: Afebrile, hypertensive, placed on 2 L/min. Notable Labs: Sodium 132, K3.9, glucose 124, BUN 65, creatinine 2.95, WBC 10.8, Hb 12.1, platelets 190, BNP 266, troponin 59->41, COVID-19 pending. Notable Imaging: Left rib x-ray-no definite displaced fractures, small left pleural effusion mildly increased interstitial markings which may be chronic lung changes versus interstitial edema. Patient received Lasix 40 mg IV x1.    Vitals:   12/27/20 1130 12/27/20 1300  BP: (!) 166/68 (!) 173/85  Pulse: 71 65  Resp: (!) 24 17  Temp:    SpO2: 99% 99%     Review of Systems:  Review of Systems  All other systems reviewed and are negative.   Medical/Social/Family History   Past Medical History: Past Medical History:  Diagnosis Date  . AF (paroxysmal atrial fibrillation) ([place])   . AICD (automatic cardioverter/defibrillator) present   . CAD (coronary artery disease)   . Cardiomyopathy, ischemic   .  Carotid artery occlusion   . Chronic kidney disease   . COPD (chronic obstructive pulmonary disease) ([place])   . Dementia without behavioral disturbance ([place])   . GERD (gastroesophageal reflux disease)   . Hyperlipidemia   . Hypertension   . Sleep apnea   . Stroke ([place])   . TIA (transient ischemic attack)     Past Surgical History:  Procedure Laterality Date  . CARDIAC PACEMAKER PLACEMENT    . CATARACT EXTRACTION    . CORONARY ARTERY BYPASS GRAFT    . CORONARY STENT PLACEMENT    . EYE SURGERY    . ICD IMPLANT N/A 12/29/2017   Procedure: ICD IMPLANT;  Surgeon: Evans Lance, MD;  Location: [facility];  Service: Cardiovascular;  Laterality: N/A;  . LEFT HEART  CATH AND CORS/GRAFTS ANGIOGRAPHY N/A 12/29/2017   Procedure: LEFT HEART CATH AND CORS/GRAFTS ANGIOGRAPHY;  Surgeon: Martinique, Peter M, MD;  Location: [facility];  Service: Cardiovascular;  Laterality: N/A;    Medications: Prior to Admission medications   Medication Sig Start Date End Date Taking? Authorizing Provider  albuterol (ACCUNEB) 0.63 MG/3ML nebulizer solution Take 3 mLs by nebulization 4 (four) times daily as needed for shortness of breath or wheezing. 3 ml inhale orally via nebulizer four times a day   Yes [provider]  albuterol (PROVENTIL HFA;VENTOLIN HFA) 108 (90 Base) MCG/ACT inhaler Inhale 2 puffs into the lungs every 6 (six) hours as needed for wheezing or shortness of breath. 06/30/16  Yes [provider]  amiodarone (PACERONE) 200 MG tablet Take 1 tablet by mouth daily Monday through Saturday. Do not take on Sunday. Patient taking differently: Take 200 mg by mouth See admin instructions. Take 200mg  by mouth daily Monday through Saturday. DO NOT take on Sunday. 11/04/20  Yes Evans Lance, MD  apixaban (ELIQUIS) 5 MG TABS tablet Take 1 tablet (5 mg total) by mouth 2 (two) times daily. 09/24/20  Yes Evans Lance, MD  ascorbic acid (VITAMIN C) 500 MG tablet Take 500 mg by mouth daily.   Yes [provider]  atorvastatin (LIPITOR) 40 MG tablet Take 1 tablet (40 mg total) by mouth daily. 02/14/20  Yes Evans Lance, MD  benzonatate (TESSALON) 100 MG capsule Take 100 mg by mouth 3 (three) times daily as needed for cough. 12/21/20  Yes [provider]  cholecalciferol (VITAMIN D) 1000 units tablet Take 1,000 Units by mouth daily.   Yes [provider]  Cyanocobalamin (VITAMIN B 12 PO) Take 1 tablet by mouth daily.   Yes [provider]  DM-Doxylamine-Acetaminophen (CORICIDIN HBP NIGHTTIME COLD) 15-6.25-325 MG/15ML LIQD Take 15 mLs by mouth as needed (cough/congestion/sleep).   Yes [provider]  doxycycline  (VIBRA-TABS) 100 MG tablet Take 100 mg by mouth 2 (two) times daily. 10 day supply 12/23/20  Yes [provider]  ferrous sulfate 325 (65 FE) MG tablet Take 325 mg by mouth daily with breakfast. 01/03/18  Yes [provider]  Fluticasone-Umeclidin-Vilant (TRELEGY ELLIPTA) 100-62.5-25 MCG/INH AEPB Inhale 1 puff into the lungs in the morning.   Yes [provider]  folic acid (FOLVITE) 751 MCG tablet Take 400 mcg by mouth daily.   Yes [provider]  hydrALAZINE (APRESOLINE) 50 MG tablet Take 1 tablet (50 mg total) by mouth 3 (three) times daily. 09/09/20 12/08/20 Yes TillerySatira Mccallum, PA-C  labetalol (NORMODYNE) 300 MG tablet Take 1 tablet (300 mg total) by mouth 2 (two) times daily. 10/30/20  Yes Evans Lance, MD  Multiple Vitamin (MULTIVITAMIN) tablet Take 1 tablet by mouth daily.   Yes [provider]  mupirocin ointment (BACTROBAN) 2 % Apply 1 application topically 2 (two) times daily. To buttocks 12/23/20  Yes [provider]  nitroGLYCERIN (NITROSTAT) 0.4 MG SL tablet Place 1 tablet (0.4 mg total) under the tongue every 5 (five) minutes as needed for chest pain. 11/02/20  Yes Evans Lance, MD  omeprazole (PRILOSEC) 40 MG capsule Take 40 mg by mouth daily as needed (indigestion/heartburn). 12/21/20  Yes [provider]  torsemide (DEMADEX) 20 MG tablet Take 2 tablets in the AM and 2 tablets in the PM Patient taking differently: Take 20-40 mg by mouth See admin instructions. Take 40mg  in the AM and 20mg  in the PM 07/31/20  Yes Tillery, Satira Mccallum, PA-C  levofloxacin (LEVAQUIN) 750 MG tablet Take 750 mg by mouth daily. 14 day supply 12/15/20   [provider]    Allergies:   Allergies  Allergen Reactions  . Ipratropium Other (See Comments)  . Metoprolol     Was bringing blood pressure too low and he would pass out.  . Morphine And Related Nausea Only  . Vicodin [Hydrocodone-Acetaminophen] Other (See Comments)     Hallucinations   . Penicillins Rash    Social History:  reports that he quit smoking about 8 years ago. His smoking use included cigarettes. He has never used smokeless tobacco. He reports that he does not drink alcohol and does not use drugs.  Family History: Family History  Problem Relation Age of Onset  . Hypertension Mother   . Heart disease Mother   . Hypertension Sister   . Heart disease Sister   . Thyroid disease Sister   . Thyroid disease Brother        hyperthyroid     Objective   Physical Exam: Blood pressure (!) 173/85, pulse 65, temperature 98.9 F (37.2 C), temperature source Oral, resp. rate 17, SpO2 99 %.  Physical Exam Vitals and nursing note reviewed.  Constitutional:      General: He is in acute distress.  HENT:     Head: Normocephalic.  Cardiovascular:     Rate and Rhythm: Regular rhythm. Tachycardia present.  Pulmonary:     Effort: Tachypnea present.     Breath sounds: Examination of the right-lower field reveals rales. Examination of the left-lower field reveals rales. Rales present.     Comments: Abdominal breathing which eventually improved with rest Lungs sounded tight with end expiratory wheeze Abdominal:     Palpations: Abdomen is soft. There is no hepatomegaly.  Musculoskeletal:     Right lower leg: Edema present.     Left lower leg: Edema present.     Comments:  Trace to 1+ RLE pitting edema 1+ LLE pitting edema  Skin:    General: Skin is warm.     Coloration: Skin is not cyanotic.  Neurological:     General: No focal deficit present.     Mental Status: He is alert.  Psychiatric:        Mood and Affect: Mood normal.        Behavior: Behavior normal.     LABS on Admission: I have personally reviewed all the labs and imaging below    Basic Metabolic Panel: Recent Labs  Lab 12/27/20 0246  NA 132*  K 3.9  CL 99  CO2 20*  GLUCOSE 124*  BUN 65*  CREATININE 2.95*  CALCIUM 9.4  Liver Function Tests: No results for  input(s): AST, ALT, ALKPHOS, BILITOT, PROT, ALBUMIN in the last 168 hours. No results for input(s): LIPASE, AMYLASE in the last 168 hours. No results for input(s): AMMONIA in the last 168 hours. CBC: Recent Labs  Lab 12/27/20 0246  WBC 10.8*  NEUTROABS 9.2*  HGB 12.1*  HCT 37.2*  MCV 92.3  PLT 190   Cardiac Enzymes: No results for input(s): CKTOTAL, CKMB, CKMBINDEX, TROPONINI in the last 168 hours. BNP: Invalid input(s): POCBNP CBG: No results for input(s): GLUCAP in the last 168 hours.  Radiological Exams on Admission:  DG Ribs Unilateral W/Chest Left  Result Date: 12/27/2020 CLINICAL DATA:  Cough and fall EXAM: LEFT RIBS AND CHEST - 3+ VIEW COMPARISON:  None. FINDINGS: No fracture or other bone lesions are seen involving the ribs, however somewhat limited. There is a small right pleural effusion. Increased interstitial markings are seen at both lung bases. There is no evidence of pneumothorax or pleural effusion. Both lungs are clear. Heart size and mediastinal contours are within normal limits. IMPRESSION: No definite displaced fractures, however somewhat limited. Small left pleural effusion. Mildly increased interstitial markings which may be due to chronic lung changes versus interstitial edema. Electronically Signed   By: Prudencio Pair M.D.   On: 12/27/2020 00:47      EKG: unchanged from previous tracings   A & P   Principal Problem:   Respiratory distress Active Problems:   Hyperlipidemia   Paroxysmal atrial fibrillation (HCC)   Chronic kidney disease, stage 3 (HCC)   COPD exacerbation (HCC)   CAD (coronary artery disease), native coronary artery   Hypertension   Dyspnea   Acute CHF (congestive heart failure) ([place])   1. Respiratory distress, concern for COPD and heart failure exacerbation a. Prednisone 40 mg daily b. Continue doxycycline from outpatient c. Continue home Trelegy Ellipta d. Standing duo nebs and as needed albuterol e. Follow-up COVID-19  labs f. Heart failure treatment as below  2. Suspected heart failure exacerbation a. Echo 12/27/2017: EF 40 to 89% with systolic and diastolic dysfunction b. BNP 266 with edema on exam c. Continue diuresis and monitoring renal function d. Echo e. Strict intake and output and daily weights  3. Ischemic cardiomyopathy  History of Cardiac arrest s/p AICD  a. Apparently went off on 12/06/2020 which is the same day he fell b. ICD interrogation  4. Atrial fibrillation a. Continue home Eliquis and amiodarone  5. Hypertension a. Continue home labetalol  6. AKI on CKD 3b, suspect cardiorenal etiology a. Creatinine 2.95, baseline 2-2.1 b. Monitor closely after diuresis  7. Hyperlipidemia a. Continue home statin  8. OSA a. CPAP HS  9. Leukocytosis, likely reactive    DVT prophylaxis: Eliquis   Code Status: Full Code  Diet: Low-salt Family Communication: Admission, patients condition and plan of care including tests being ordered have been discussed with the patient who indicates understanding and agrees with the plan and Code Status. [name] was updated  Disposition Plan: The appropriate patient status for this patient is INPATIENT. Inpatient status is judged to be reasonable and necessary in order to provide the required intensity of service to ensure the patient's safety. The patient's presenting symptoms, physical exam findings, and initial radiographic and laboratory data in the context of their chronic comorbidities is felt to place them at high risk for further clinical deterioration. Furthermore, it is not anticipated that the patient will be medically stable for discharge from the hospital within 2 midnights of  admission. The following factors support the patient status of inpatient.   " The patient's presenting symptoms include shortness of breath. " The worrisome physical exam findings include conversational dyspnea and edema and wheezing. " The initial radiographic and  laboratory data are worrisome because of CXR with concern for interstitial edema " The chronic co-morbidities include ischemic cardiomyopathy, COPD.   * I certify that at the point of admission it is my clinical judgment that the patient will require inpatient hospital care spanning beyond 2 midnights from the point of admission due to high intensity of service, high risk for further deterioration and high frequency of surveillance required.*   Status is: Inpatient  Remains inpatient appropriate because:IV treatments appropriate due to intensity of illness or inability to take PO and Inpatient level of care appropriate due to severity of illness   Dispo: The patient is from: Home              Anticipated d/c is to: Home              Anticipated d/c date is: > 3 days              Patient currently is not medically stable to d/c.     Consultants  . None  Procedures  . ICD interrogation  Time Spent on Admission: 66 minutes    Harold Hedge, DO [facility]  12/27/2020, 2:20 PM

## 2020-12-27 NOTE — ED Notes (Signed)
Pt ambulate around the room 96% -91% pt felt swimming head

## 2020-12-27 NOTE — ED Notes (Signed)
Pt wound on bottom changed

## 2020-12-28 ENCOUNTER — Inpatient Hospital Stay (HOSPITAL_COMMUNITY): Payer: HMO

## 2020-12-28 ENCOUNTER — Encounter: Payer: HMO | Admitting: Student

## 2020-12-28 DIAGNOSIS — I2510 Atherosclerotic heart disease of native coronary artery without angina pectoris: Secondary | ICD-10-CM

## 2020-12-28 DIAGNOSIS — J441 Chronic obstructive pulmonary disease with (acute) exacerbation: Secondary | ICD-10-CM

## 2020-12-28 DIAGNOSIS — R0602 Shortness of breath: Secondary | ICD-10-CM

## 2020-12-28 DIAGNOSIS — I480 Paroxysmal atrial fibrillation: Secondary | ICD-10-CM

## 2020-12-28 DIAGNOSIS — N1832 Chronic kidney disease, stage 3b: Secondary | ICD-10-CM

## 2020-12-28 DIAGNOSIS — I10 Essential (primary) hypertension: Secondary | ICD-10-CM

## 2020-12-28 DIAGNOSIS — E7800 Pure hypercholesterolemia, unspecified: Secondary | ICD-10-CM

## 2020-12-28 DIAGNOSIS — R55 Syncope and collapse: Secondary | ICD-10-CM

## 2020-12-28 DIAGNOSIS — I509 Heart failure, unspecified: Secondary | ICD-10-CM

## 2020-12-28 DIAGNOSIS — R0603 Acute respiratory distress: Secondary | ICD-10-CM

## 2020-12-28 LAB — BASIC METABOLIC PANEL
Anion gap: 11 (ref 5–15)
BUN: 65 mg/dL — ABNORMAL HIGH (ref 8–23)
CO2: 20 mmol/L — ABNORMAL LOW (ref 22–32)
Calcium: 9.1 mg/dL (ref 8.9–10.3)
Chloride: 105 mmol/L (ref 98–111)
Creatinine, Ser: 2.95 mg/dL — ABNORMAL HIGH (ref 0.61–1.24)
GFR, Estimated: 21 mL/min — ABNORMAL LOW (ref >60)
Glucose, Bld: 105 mg/dL — ABNORMAL HIGH (ref 70–99)
Potassium: 3.8 mmol/L (ref 3.5–5.1)
Sodium: 136 mmol/L (ref 135–145)

## 2020-12-28 LAB — CBC
HCT: 30.7 % — ABNORMAL LOW (ref 39.0–52.0)
Hemoglobin: 9.9 g/dL — ABNORMAL LOW (ref 13.0–17.0)
MCH: 29.9 pg (ref 26.0–34.0)
MCHC: 32.2 g/dL (ref 30.0–36.0)
MCV: 92.7 fL (ref 80.0–100.0)
Platelets: 150 10*3/uL (ref 150–400)
RBC: 3.31 MIL/uL — ABNORMAL LOW (ref 4.22–5.81)
RDW: 15.5 % (ref 11.5–15.5)
WBC: 5.5 10*3/uL (ref 4.0–10.5)
nRBC: 0 % (ref 0.0–0.2)

## 2020-12-28 LAB — ECHOCARDIOGRAM COMPLETE
Area-P 1/2: 3.12 cm2
Calc EF: 52.4 %
Height: 69 in
MV M vel: 6.33 m/s
MV Peak grad: 160.3 mmHg
Radius: 0.4 cm
S' Lateral: 3.6 cm
Single Plane A2C EF: 58.2 %
Single Plane A4C EF: 44.4 %
Weight: 2744.29 oz

## 2020-12-28 LAB — MAGNESIUM: Magnesium: 2.3 mg/dL (ref 1.7–2.4)

## 2020-12-28 LAB — TSH: TSH: 0.02 u[IU]/mL — ABNORMAL LOW (ref 0.350–4.500)

## 2020-12-28 MED ORDER — PERFLUTREN LIPID MICROSPHERE
1.0000 mL | INTRAVENOUS | Status: AC | PRN
  Administered 2020-12-28: 2 mL via INTRAVENOUS
  Filled 2020-12-28: qty 10

## 2020-12-28 MED ORDER — ORAL CARE MOUTH RINSE
15.0000 mL | Freq: Two times a day (BID) | OROMUCOSAL | Status: DC
  Administered 2020-12-28 – 2020-12-29 (×3): 15 mL via OROMUCOSAL

## 2020-12-28 NOTE — Progress Notes (Signed)
  Echocardiogram 2D Echocardiogram has been performed.  Bobbye Charleston 12/28/2020, 2:32 PM

## 2020-12-28 NOTE — Progress Notes (Signed)
Patient stated that he could not breath with mask on. CPAP on hold at this time.

## 2020-12-28 NOTE — Progress Notes (Signed)
PROGRESS NOTE  Joe Wiggins KDT:[id number] DOB: 10-15-1946 DOA: 12/27/2020 PCP: Ezequiel Essex, MD   LOS: 1 day   Brief narrative: As per HPI,  This is a 75 year old male  with  past medical history of COPD on as needed O2 nightly, ischemic cardiomyopathy, AICD, atrial fibrillation on Eliquis and amiodarone, hypertension, hyperlipidemia, OSA, CKD 3B, recent fall, CVA  presented to the ED with shortness of breath x1 week with increased worsening for 1 day.  He also has a fall with some soreness of his ribs.  Wife reports 1 week after the fall he became short of breath with a cough and notified his pulmonologist, Dr. Camillo Flaming, at [facility] who started him on prednisone and Levaquin on 12/21.Patient's shortness of breath has continued and progressed and was brought back to hospital.  In the ED patient was afebrile hypertensive and was placed on 2 L of oxygen by nasal cannula.   Laboratory data showed hyponatremia, elevated BUN and creatinine, elevated BNP 266, troponin 59->41, COVID-19 was negative.  Chest x-ray showed no fracture small pleural effusion and increased interstitial markings.   Patient received Lasix 40 mg IV x1.   Patient was then considered for admission to the hospital.  Assessment/Plan:  Principal Problem:   Respiratory distress Active Problems:   Hyperlipidemia   Paroxysmal atrial fibrillation (HCC)   Chronic kidney disease, stage 3 (HCC)   COPD exacerbation (HCC)   CAD (coronary artery disease), native coronary artery   Hypertension   Dyspnea   Acute CHF (congestive heart failure) (HCC)   Acute respiratory distress, likely secondary to COPD and heart failure exacerbation Continue oxygen nebulizers prednisone doxycyclineTrelegy Ellipta.  Continue treatment for heart failure.  Feels a little better with breathing today.  Mild acute on chronic systolic heart failure exacerbation 2D echocardiogram on 12/27/2017: EF 40 to 99% with systolic and diastolic dysfunction.  Elevated BNP on  exam with peripheral edema.  Continue diuresis.  Check 2D echocardiogram-pending.  Output charting and daily weights.  Ischemic cardiomyopathy  History of Cardiac arrest s/p AICD  Patient had AICD firing on apparently went off on 12/06/2020 which is the same day he fell. AICD interrogation was attempted at that time.  Get PT evaluation  Atrial fibrillation Rate controlled.  Continue  home Eliquis and amiodarone  Essential hypertension Continue home labetalol.  Closely monitor blood pressure.  AKI on CKD 3b, suspect cardiorenal etiology Creatinine on presentation at 2.9 baseline 2-2.1, monitor closely after diuresis  Hyperlipidemia Continue home statin  OSA CPAP HS  Leukocytosis, likely reactive Monitor.  Recent fall weakness will get PT evaluation.  DVT prophylaxis:  apixaban (ELIQUIS) tablet 5 mg    Code Status: Full code  Family Communication: Patient's spouse at bedside. Status is: Inpatient  Remains inpatient appropriate because:IV treatments appropriate due to intensity of illness or inability to take PO, Inpatient level of care appropriate due to severity of illness and IV diuresis, antibiotics   Dispo: The patient is from: Home              Anticipated d/c is to: Home home health likely, will get PT evaluation              Anticipated d/c date is: 2 days              Patient currently is not medically stable to d/c.   Consultants:  None  Procedures:  None  Antibiotics:  . Doxycycline 1-2/22>  Anti-infectives (From admission, onward)   Start  Dose/Rate Route Frequency Ordered Stop   12/27/20 1530  doxycycline (VIBRA-TABS) tablet 100 mg       Note to Pharmacy: 10 day supply     100 mg Oral 2 times daily 12/27/20 1228       Subjective: Today, patient was seen and examined at bedside.  Patient continues have a cough with clear phlegm production.  Denies any chest pain palpitation.  Denies overt dyspnea but has mild shortness of  breath   Objective: Vitals:   12/28/20 0200 12/28/20 0558  BP: (!) 136/59 124/62  Pulse: 70 60  Resp: 20 20  Temp: 98 F (36.7 C) 97.9 F (36.6 C)  SpO2: 100% 95%    Intake/Output Summary (Last 24 hours) at 12/28/2020 6761 Last data filed at 12/28/2020 9509 Gross per 24 hour  Intake 240 ml  Output 1250 ml  Net -1010 ml   Filed Weights   12/27/20 2338  Weight: 77.8 kg   Body mass index is 25.33 kg/m.   Physical Exam:  GENERAL: Patient is alert awake communicative.  Not in obvious distress, on 2 L of oxygen by nasal cannula. HENT: No scleral pallor or icterus. Pupils equally reactive to light. Oral mucosa is moist NECK: is supple, no gross swelling noted. CHEST: Diminished breath sounds bilaterally.  No wheezes. CVS: S1 and S2 heard, no murmur AICD in place. ABDOMEN: Soft, non-tender, bowel sounds are present. EXTREMITIES: Bilateral lower extremity 1+ pitting edema CNS: Cranial nerves are intact. No focal motor deficits. SKIN: warm and dry without rashes.  Data Review: I have personally reviewed the following laboratory data and studies,  CBC: Recent Labs  Lab 12/27/20 0246 12/28/20 0539  WBC 10.8* 5.5  NEUTROABS 9.2*  --   HGB 12.1* 9.9*  HCT 37.2* 30.7*  MCV 92.3 92.7  PLT 190 326   Basic Metabolic Panel: Recent Labs  Lab 12/27/20 0246 12/28/20 0539  NA 132* 136  K 3.9 3.8  CL 99 105  CO2 20* 20*  GLUCOSE 124* 105*  BUN 65* 65*  CREATININE 2.95* 2.95*  CALCIUM 9.4 9.1  MG  --  2.3   Liver Function Tests: No results for input(s): AST, ALT, ALKPHOS, BILITOT, PROT, ALBUMIN in the last 168 hours. No results for input(s): LIPASE, AMYLASE in the last 168 hours. No results for input(s): AMMONIA in the last 168 hours. Cardiac Enzymes: No results for input(s): CKTOTAL, CKMB, CKMBINDEX, TROPONINI in the last 168 hours. BNP (last 3 results) Recent Labs    12/27/20 0246  BNP 266.2*    ProBNP (last 3 results) No results for input(s): PROBNP in the  last 8760 hours.  CBG: No results for input(s): GLUCAP in the last 168 hours. Recent Results (from the past 240 hour(s))  SARS CORONAVIRUS 2 (TAT 6-24 HRS) Nasopharyngeal Nasopharyngeal Swab     Status: None   Collection Time: 12/27/20 11:46 AM   Specimen: Nasopharyngeal Swab  Result Value Ref Range Status   SARS Coronavirus 2 NEGATIVE NEGATIVE Final    Comment: (NOTE) SARS-CoV-2 target nucleic acids are NOT DETECTED.  The SARS-CoV-2 RNA is generally detectable in upper and lower respiratory specimens during the acute phase of infection. Negative results do not preclude SARS-CoV-2 infection, do not rule out co-infections with other pathogens, and should not be used as the sole basis for treatment or other patient management decisions. Negative results must be combined with clinical observations, patient history, and epidemiological information. The expected result is Negative.  Fact Sheet for Patients: SugarRoll.be  Fact Sheet for Healthcare Providers: https://www.woods-mathews.com/  This test is not yet approved or cleared by the [place] FDA and  has been authorized for detection and/or diagnosis of SARS-CoV-2 by FDA under an Emergency Use Authorization (EUA). This EUA will remain  in effect (meaning this test can be used) for the duration of the COVID-19 declaration under Se ction 564(b)(1) of the Act, 21 U.S.C. section 360bbb-3(b)(1), unless the authorization is terminated or revoked sooner.  Performed at [facility], [place] [address] [place], [place] [address]      Studies: DG Ribs Unilateral W/Chest Left  Result Date: 12/27/2020 CLINICAL DATA:  Cough and fall EXAM: LEFT RIBS AND CHEST - 3+ VIEW COMPARISON:  None. FINDINGS: No fracture or other bone lesions are seen involving the ribs, however somewhat limited. There is a small right pleural effusion. Increased interstitial markings are seen at both lung bases. There  is no evidence of pneumothorax or pleural effusion. Both lungs are clear. Heart size and mediastinal contours are within normal limits. IMPRESSION: No definite displaced fractures, however somewhat limited. Small left pleural effusion. Mildly increased interstitial markings which may be due to chronic lung changes versus interstitial edema. Electronically Signed   By: Prudencio Pair M.D.   On: 12/27/2020 00:47      Flora Lipps, MD  [facility] 12/28/2020  If 7PM-7AM, please contact night-coverage

## 2020-12-29 LAB — BASIC METABOLIC PANEL
Anion gap: 9 (ref 5–15)
BUN: 64 mg/dL — ABNORMAL HIGH (ref 8–23)
CO2: 21 mmol/L — ABNORMAL LOW (ref 22–32)
Calcium: 9.2 mg/dL (ref 8.9–10.3)
Chloride: 109 mmol/L (ref 98–111)
Creatinine, Ser: 2.6 mg/dL — ABNORMAL HIGH (ref 0.61–1.24)
GFR, Estimated: 25 mL/min — ABNORMAL LOW (ref >60)
Glucose, Bld: 135 mg/dL — ABNORMAL HIGH (ref 70–99)
Potassium: 4.1 mmol/L (ref 3.5–5.1)
Sodium: 139 mmol/L (ref 135–145)

## 2020-12-29 LAB — CBC
HCT: 30.7 % — ABNORMAL LOW (ref 39.0–52.0)
Hemoglobin: 10 g/dL — ABNORMAL LOW (ref 13.0–17.0)
MCH: 30 pg (ref 26.0–34.0)
MCHC: 32.6 g/dL (ref 30.0–36.0)
MCV: 92.2 fL (ref 80.0–100.0)
Platelets: 151 10*3/uL (ref 150–400)
RBC: 3.33 MIL/uL — ABNORMAL LOW (ref 4.22–5.81)
RDW: 15.5 % (ref 11.5–15.5)
WBC: 5.2 10*3/uL (ref 4.0–10.5)
nRBC: 0 % (ref 0.0–0.2)

## 2020-12-29 MED ORDER — DOXYCYCLINE HYCLATE 100 MG PO TABS: 100.0000 mg | ORAL_TABLET | Freq: Two times a day (BID) | ORAL | 0 refills | Status: AC

## 2020-12-29 MED ORDER — PREDNISONE 20 MG PO TABS: 40.0000 mg | ORAL_TABLET | Freq: Every day | ORAL | 0 refills | Status: AC

## 2020-12-29 NOTE — Plan of Care (Signed)
  Problem: Health Behavior/Discharge Planning: Goal: Ability to manage health-related needs will improve Outcome: Progressing   Problem: Clinical Measurements: Goal: Will remain free from infection Outcome: Progressing   Problem: Clinical Measurements: Goal: Respiratory complications will improve Outcome: Progressing   Problem: Clinical Measurements: Goal: Cardiovascular complication will be avoided Outcome: Progressing   Problem: Skin Integrity: Goal: Risk for impaired skin integrity will decrease Outcome: Progressing

## 2020-12-29 NOTE — Discharge Summary (Signed)
Physician Discharge Summary  Joe Wiggins JEH:[id number] DOB: 26-Jun-1946 DOA: 12/27/2020  PCP: Ezequiel Essex, MD  Admit date: 12/27/2020 Discharge date: 12/29/2020  Admitted From: Home  Discharge disposition: Home health   Recommendations for Outpatient Follow-Up:   . Follow up with your primary care provider in one week.  . Check CBC, BMP, magnesium in the next visit . Follow-up with regular cardiologist as outpatient.  Discharge Diagnosis:   Principal Problem:   Respiratory distress Active Problems:   Hyperlipidemia   Paroxysmal atrial fibrillation (HCC)   Chronic kidney disease, stage 3 (HCC)   COPD exacerbation (HCC)   CAD (coronary artery disease), native coronary artery   Hypertension   Dyspnea   Acute CHF (congestive heart failure) ([place])   Discharge Condition: Improved.  Diet recommendation: Low sodium, heart healthy.   Wound care: None.  Code status: Full.   History of Present Illness:   This is a 75 year old male  with  past medical history of COPD on as needed O2 nightly, ischemic cardiomyopathy, AICD, atrial fibrillation on Eliquis and amiodarone, hypertension, hyperlipidemia, OSA, CKD 3B, recent fall, CVA  presented to the ED with shortness of breath x1 week with increased worsening for 1 day.  He also has a fall with some soreness of his ribs. Wife reports 1 week after the fall he became short of breath with a cough and notified his pulmonologist, Dr. Joline Salt [facility] who started him on prednisone and Levaquin on 12/21.Patient's shortness of breath has continued and progressed and was brought back to hospital.  In the ED patient was afebrile hypertensive and was placed on 2 L of oxygen by nasal cannula.  Laboratory data showed hyponatremia, elevated BUN and creatinine, elevated BNP 266, troponin59->41, COVID-19 was negative.  Chest x-ray showed no fracture small pleural effusion and increased interstitial markings.   Patient receivedLasix 40 mg IV x1.  Patient  was then considered for admission to the hospital.   Hospital Course:   Following conditions were addressed during hospitalization as listed below,  Acute respiratory distress, likely secondary to COPD and heart failure exacerbation.  Resolved. Continue  prednisone doxycyclineTrelegy Ellipta on discharge.  Patient has been weaned off supplemental oxygen.  He was able to ambulate without need of oxygen.  Mild acute on chronic systolic heart failure exacerbation 2D echocardiogram on 12/27/2017: EF 40 to 37% with systolic and diastolic dysfunction.  Elevated BNP on exam with peripheral edema.    Received with IV diuresis with significant response.  2D echocardiogram from December 28, 2020 shows septal hypokinesis and dyssynchrony consistent with right ventricular pacing.  Left ventricular ejection fraction of 45 to 50%.  Concentric LVH with grade 2 diastolic dysfunction.  Patient will resume outpatient diuretic regimen on discharge.  Ischemic cardiomyopathy History of Cardiac arrests/p AICD Patient had AICD firing on apparently went off on 12/12/2021which is the same day he fell.  Patient follows up with ICD clinic.  No recent events.  Atrial fibrillation Rate controlled.  Continue  home Eliquis and amiodarone  Essential hypertension Continue home labetalol.    AKI on CKD 3b, suspect cardiorenal etiology Creatinine on presentation at 2.9, baseline 2-2.1, improved with diuresis to 2.6.  Will need to follow-up as outpatient  Hyperlipidemia Continue home statin  OSA Encouraged CPAP HS  Leukocytosis, likely reactive Resolved  Recent fall weakness.  Patient was seen by physical therapy recommending home PT on discharge.  Disposition.  At this time, patient is stable for disposition home with home health.  Medical Consultants:  None.  Procedures:    None Subjective:   Today, patient was seen and examined at bedside.  Patient feels much better today with no shortness  of breath chest pain palpitation dizziness or lightheadedness.  Discharge Exam:   Vitals:   12/29/20 0606 12/29/20 0734  BP: (!) 160/75   Pulse: 66   Resp: 18   Temp: 98 F (36.7 C)   SpO2: 98% 99%   Vitals:   12/28/20 2014 12/28/20 2109 12/29/20 0606 12/29/20 0734  BP:  (!) 165/64 (!) 160/75   Pulse:  71 66   Resp: 18 18 18    Temp:  98.7 F (37.1 C) 98 F (36.7 C)   TempSrc:      SpO2:  96% 98% 99%  Weight:   76.9 kg   Height:       General: Alert awake, not in obvious distress, off supplemental oxygen HENT: pupils equally reacting to light,  No scleral pallor or icterus noted. Oral mucosa is moist.  Chest:  Clear breath sounds.  Diminished breath sounds bilaterally. No crackles or wheezes.  CVS: S1 &S2 heard. No murmur.  Regular rate and rhythm.  Previous CABG scar. Abdomen: Soft, nontender, nondistended.  Bowel sounds are heard.   Extremities: No cyanosis, clubbing but +1 edema.  Peripheral pulses are palpable. Psych: Alert, awake and oriented, normal mood CNS:  No cranial nerve deficits.  Power equal in all extremities.   Skin: Warm and dry.  No rashes noted.  The results of significant diagnostics from this hospitalization (including imaging, microbiology, ancillary and laboratory) are listed below for reference.     Diagnostic Studies:   DG Ribs Unilateral W/Chest Left  Result Date: 12/27/2020 CLINICAL DATA:  Cough and fall EXAM: LEFT RIBS AND CHEST - 3+ VIEW COMPARISON:  None. FINDINGS: No fracture or other bone lesions are seen involving the ribs, however somewhat limited. There is a small right pleural effusion. Increased interstitial markings are seen at both lung bases. There is no evidence of pneumothorax or pleural effusion. Both lungs are clear. Heart size and mediastinal contours are within normal limits. IMPRESSION: No definite displaced fractures, however somewhat limited. Small left pleural effusion. Mildly increased interstitial markings which may be due  to chronic lung changes versus interstitial edema. Electronically Signed   By: Prudencio Pair M.D.   On: 12/27/2020 00:47   ECHOCARDIOGRAM COMPLETE  Result Date: 12/28/2020    ECHOCARDIOGRAM REPORT   Patient Name:   Joe Wiggins Date of Exam: 12/28/2020 Medical Rec #:  [id number]  Height:       69.0 in Accession #:    [id number] Weight:       171.5 lb Date of Birth:  09-Aug-1946  BSA:          1.935 m Patient Age:    7 years   BP:           146/50 mmHg Patient Gender: M          HR:           63 bpm. Exam Location:  Inpatient Procedure: 2D Echo, Cardiac Doppler, Color Doppler and Intracardiac            Opacification Agent Indications:    R06.02 SOB; R55 Syncope  History:        Patient has prior history of Echocardiogram examinations, most                 recent 12/27/2017. CHF, Abnormal ECG, Defibrillator and Pacemaker,  COPD and TIA, Arrythmias:Cardiac Arrest and Atrial Fibrillation,                 Signs/Symptoms:Dyspnea, Altered Mental Status and                 Dizziness/Lightheadedness; Risk Factors:Hypertension.  Sonographer:    Roseanna Rainbow RDCS Referring Phys: [id number] [place]  1. Septal hypokinesis and dyssynchrony consistent with right ventricular pacing. Left ventricular ejection fraction, by estimation, is 45 to 50%. The left ventricle has mildly decreased function. The left ventricle has no regional wall motion abnormalities. There is mild concentric left ventricular hypertrophy. Left ventricular diastolic parameters are consistent with Grade II diastolic dysfunction (pseudonormalization). Elevated left ventricular end-diastolic pressure.  2. Right ventricular systolic function is normal. The right ventricular size is normal. There is mildly elevated pulmonary artery systolic pressure.  3. Right atrial size was moderately dilated.  4. The mitral valve is normal in structure. Mild mitral valve regurgitation. No evidence of mitral stenosis.  5. Tricuspid valve regurgitation is  mild to moderate.  6. The aortic valve is normal in structure. Aortic valve regurgitation is not visualized. No aortic stenosis is present.  7. The inferior vena cava is dilated in size with <50% respiratory variability, suggesting right atrial pressure of 15 mmHg. FINDINGS  Left Ventricle: Septal hypokinesis and dyssynchrony consistent with right ventricular pacing. Left ventricular ejection fraction, by estimation, is 45 to 50%. The left ventricle has mildly decreased function. The left ventricle has no regional wall motion abnormalities. Definity contrast agent was given IV to delineate the left ventricular endocardial borders. The left ventricular internal cavity size was normal in size. There is mild concentric left ventricular hypertrophy. Left ventricular diastolic parameters are consistent with Grade II diastolic dysfunction (pseudonormalization). Elevated left ventricular end-diastolic pressure. Right Ventricle: The right ventricular size is normal. No increase in right ventricular wall thickness. Right ventricular systolic function is normal. There is mildly elevated pulmonary artery systolic pressure. The tricuspid regurgitant velocity is 2.94  m/s, and with an assumed right atrial pressure of 3 mmHg, the estimated right ventricular systolic pressure is 63.7 mmHg. Left Atrium: Left atrial size was normal in size. Right Atrium: Right atrial size was moderately dilated. Pericardium: There is no evidence of pericardial effusion. Mitral Valve: The mitral valve is normal in structure. Mild mitral valve regurgitation. No evidence of mitral valve stenosis. Tricuspid Valve: The tricuspid valve is normal in structure. Tricuspid valve regurgitation is mild to moderate. No evidence of tricuspid stenosis. Aortic Valve: The aortic valve is normal in structure. Aortic valve regurgitation is not visualized. No aortic stenosis is present. Pulmonic Valve: The pulmonic valve was normal in structure. Pulmonic valve  regurgitation is not visualized. No evidence of pulmonic stenosis. Aorta: The aortic root is normal in size and structure. Venous: The inferior vena cava is dilated in size with less than 50% respiratory variability, suggesting right atrial pressure of 15 mmHg. IAS/Shunts: No atrial level shunt detected by color flow Doppler. Additional Comments: A pacer wire is visualized.  LEFT VENTRICLE PLAX 2D LVIDd:         5.80 cm      Diastology LVIDs:         3.60 cm      LV e' medial:    5.87 cm/s LV PW:         1.30 cm      LV E/e' medial:  17.7 LV IVS:        1.20 cm  LV e' lateral:   8.70 cm/s LVOT diam:     2.10 cm      LV E/e' lateral: 12.0 LV SV:         71 LV SV Index:   37 LVOT Area:     3.46 cm  LV Volumes (MOD) LV vol d, MOD A2C: 111.0 ml LV vol d, MOD A4C: 118.3 ml LV vol s, MOD A2C: 46.4 ml LV vol s, MOD A4C: 65.8 ml LV SV MOD A2C:     64.6 ml LV SV MOD A4C:     118.3 ml LV SV MOD BP:      63.0 ml RIGHT VENTRICLE             IVC RV S prime:     12.50 cm/s  IVC diam: 1.70 cm TAPSE (M-mode): 1.6 cm LEFT ATRIUM             Index       RIGHT ATRIUM           Index LA diam:        3.80 cm 1.96 cm/m  RA Area:     22.30 cm LA Vol (A2C):   35.1 ml 18.14 ml/m RA Volume:   70.70 ml  36.53 ml/m LA Vol (A4C):   30.5 ml 15.76 ml/m LA Biplane Vol: 33.3 ml 17.21 ml/m  AORTIC VALVE LVOT Vmax:   100.00 cm/s LVOT Vmean:  68.100 cm/s LVOT VTI:    0.205 m  AORTA Ao Root diam: 3.50 cm MITRAL VALVE                 TRICUSPID VALVE MV Area (PHT): 3.12 cm      TR Peak grad:   34.6 mmHg MV Decel Time: 243 msec      TR Vmax:        294.00 cm/s MR Peak grad:    160.3 mmHg MR Mean grad:    103.0 mmHg  SHUNTS MR Vmax:         633.00 cm/s Systemic VTI:  0.20 m MR Vmean:        477.0 cm/s  Systemic Diam: 2.10 cm MR PISA:         1.01 cm MR PISA Eff ROA: 6 mm MR PISA Radius:  0.40 cm MV E velocity: 104.00 cm/s MV A velocity: 79.90 cm/s MV E/A ratio:  1.30 Skeet Latch MD Electronically signed by Skeet Latch MD Signature  Date/Time: 12/28/2020/5:45:38 PM    Final      Labs:   Basic Metabolic Panel: Recent Labs  Lab 12/27/20 0246 12/28/20 0539 12/29/20 0505  NA 132* 136 139  K 3.9 3.8 4.1  CL 99 105 109  CO2 20* 20* 21*  GLUCOSE 124* 105* 135*  BUN 65* 65* 64*  CREATININE 2.95* 2.95* 2.60*  CALCIUM 9.4 9.1 9.2  MG  --  2.3  --    GFR Estimated Creatinine Clearance: 24.5 mL/min (A) (by C-G formula based on SCr of 2.6 mg/dL (H)). Liver Function Tests: No results for input(s): AST, ALT, ALKPHOS, BILITOT, PROT, ALBUMIN in the last 168 hours. No results for input(s): LIPASE, AMYLASE in the last 168 hours. No results for input(s): AMMONIA in the last 168 hours. Coagulation profile Recent Labs  Lab 12/27/20 0246  INR 1.6*    CBC: Recent Labs  Lab 12/27/20 0246 12/28/20 0539 12/29/20 0505  WBC 10.8* 5.5 5.2  NEUTROABS 9.2*  --   --   HGB 12.1* 9.9*  10.0*  HCT 37.2* 30.7* 30.7*  MCV 92.3 92.7 92.2  PLT 190 150 151   Cardiac Enzymes: No results for input(s): CKTOTAL, CKMB, CKMBINDEX, TROPONINI in the last 168 hours. BNP: Invalid input(s): POCBNP CBG: No results for input(s): GLUCAP in the last 168 hours. D-Dimer No results for input(s): DDIMER in the last 72 hours. Hgb A1c No results for input(s): HGBA1C in the last 72 hours. Lipid Profile No results for input(s): CHOL, HDL, LDLCALC, TRIG, CHOLHDL, LDLDIRECT in the last 72 hours. Thyroid function studies Recent Labs    12/28/20 0539  TSH 0.020*   Anemia work up No results for input(s): VITAMINB12, FOLATE, FERRITIN, TIBC, IRON, RETICCTPCT in the last 72 hours. Microbiology Recent Results (from the past 240 hour(s))  SARS CORONAVIRUS 2 (TAT 6-24 HRS) Nasopharyngeal Nasopharyngeal Swab     Status: None   Collection Time: 12/27/20 11:46 AM   Specimen: Nasopharyngeal Swab  Result Value Ref Range Status   SARS Coronavirus 2 NEGATIVE NEGATIVE Final    Comment: (NOTE) SARS-CoV-2 target nucleic acids are NOT DETECTED.  The  SARS-CoV-2 RNA is generally detectable in upper and lower respiratory specimens during the acute phase of infection. Negative results do not preclude SARS-CoV-2 infection, do not rule out co-infections with other pathogens, and should not be used as the sole basis for treatment or other patient management decisions. Negative results must be combined with clinical observations, patient history, and epidemiological information. The expected result is Negative.  Fact Sheet for Patients: SugarRoll.be  Fact Sheet for Healthcare Providers: https://www.woods-mathews.com/  This test is not yet approved or cleared by the [place] FDA and  has been authorized for detection and/or diagnosis of SARS-CoV-2 by FDA under an Emergency Use Authorization (EUA). This EUA will remain  in effect (meaning this test can be used) for the duration of the COVID-19 declaration under Se ction 564(b)(1) of the Act, 21 U.S.C. section 360bbb-3(b)(1), unless the authorization is terminated or revoked sooner.  Performed at [facility], [place] [address], [place], [place] [address]      Discharge Instructions:   Discharge Instructions    Call MD for:   Complete by: As directed    Worsening symptoms   Call MD for:  temperature >100.4   Complete by: As directed    Diet - low sodium heart healthy   Complete by: As directed    1500 mL of fluid restriction per day.   Discharge instructions   Complete by: As directed    Follow up with your primary care provider in one week. Check blood work at that time.  Continue medications from home.  Use CPAP at nighttime.  Follow-up with your cardiologist as scheduled by you.   Increase activity slowly   Complete by: As directed      Allergies as of 12/29/2020      Reactions   Ipratropium Other (See Comments)   Metoprolol    Was bringing blood pressure too low and he would pass out.   Morphine And Related Nausea Only    Vicodin [hydrocodone-acetaminophen] Other (See Comments)   Hallucinations   Penicillins Rash      Medication List    STOP taking these medications   Coricidin HBP Nighttime Cold 15-6.25-325 MG/15ML Liqd Generic drug: DM-Doxylamine-Acetaminophen   hydrALAZINE 50 MG tablet Commonly known as: APRESOLINE   levofloxacin 750 MG tablet Commonly known as: LEVAQUIN     TAKE these medications   albuterol 0.63 MG/3ML nebulizer solution Commonly known as:  ACCUNEB Take 3 mLs by nebulization 4 (four) times daily as needed for shortness of breath or wheezing. 3 ml inhale orally via nebulizer four times a day   albuterol 108 (90 Base) MCG/ACT inhaler Commonly known as: VENTOLIN HFA Inhale 2 puffs into the lungs every 6 (six) hours as needed for wheezing or shortness of breath.   amiodarone 200 MG tablet Commonly known as: PACERONE Take 1 tablet by mouth daily Monday through Saturday. Do not take on Sunday. What changed: See the new instructions.   apixaban 5 MG Tabs tablet Commonly known as: ELIQUIS Take 1 tablet (5 mg total) by mouth 2 (two) times daily.   ascorbic acid 500 MG tablet Commonly known as: VITAMIN C Take 500 mg by mouth daily.   atorvastatin 40 MG tablet Commonly known as: LIPITOR Take 1 tablet (40 mg total) by mouth daily.   benzonatate 100 MG capsule Commonly known as: TESSALON Take 100 mg by mouth 3 (three) times daily as needed for cough.   cholecalciferol 25 MCG (1000 UNIT) tablet Commonly known as: VITAMIN D Take 1,000 Units by mouth daily.   doxycycline 100 MG tablet Commonly known as: VIBRA-TABS Take 1 tablet (100 mg total) by mouth 2 (two) times daily for 5 days. 10 day supply   ferrous sulfate 325 (65 FE) MG tablet Take 325 mg by mouth daily with breakfast.   folic acid 419 MCG tablet Commonly known as: FOLVITE Take 400 mcg by mouth daily.   labetalol 300 MG tablet Commonly known as: NORMODYNE Take 1 tablet (300 mg total) by mouth 2 (two) times  daily.   multivitamin tablet Take 1 tablet by mouth daily.   mupirocin ointment 2 % Commonly known as: BACTROBAN Apply 1 application topically 2 (two) times daily. To buttocks   nitroGLYCERIN 0.4 MG SL tablet Commonly known as: NITROSTAT Place 1 tablet (0.4 mg total) under the tongue every 5 (five) minutes as needed for chest pain.   omeprazole 40 MG capsule Commonly known as: PRILOSEC Take 40 mg by mouth daily as needed (indigestion/heartburn).   predniSONE 20 MG tablet Commonly known as: DELTASONE Take 2 tablets (40 mg total) by mouth daily with breakfast for 4 days. Start taking on: December 30, 2020   torsemide 20 MG tablet Commonly known as: DEMADEX Take 2 tablets in the AM and 2 tablets in the PM What changed:   how much to take  how to take this  when to take this  additional instructions   Trelegy Ellipta 100-62.5-25 MCG/INH Aepb Generic drug: Fluticasone-Umeclidin-Vilant Inhale 1 puff into the lungs in the morning.   VITAMIN B 12 PO Take 1 tablet by mouth daily.       Follow-up Information    Ezequiel Essex, MD. Schedule an appointment as soon as possible for a visit in 1 week(s).   Specialty: Family Medicine Why: regular check up Contact information: [place] [address] [phone]        Evans Lance, MD .   Specialty: Cardiology Contact information: [phone] [place] [address] [phone]                Time coordinating discharge: 39 minutes  Signed:  Noeh Sparacino  [facility] 12/29/2020, 2:46 PM

## 2020-12-29 NOTE — TOC Progression Note (Signed)
Transition of Care [facility]) - Progression Note    Patient Details  Name: Master Touchet MRN: [id number] Date of Birth: April 21, 1946  Transition of Care [facility]) CM/SW Contact  Clete Kuch, Juliann Pulse, RN Phone Number: 12/29/2020, 3:01 PM  Clinical Narrative: Recc HHPT-awaiting an agency to accept.           Expected Discharge Plan and Services           Expected Discharge Date: 12/29/20                                     Social Determinants of Health (SDOH) Interventions    Readmission Risk Interventions No flowsheet data found.

## 2020-12-29 NOTE — TOC Transition Note (Signed)
Transition of Care [facility]) - CM/SW Discharge Note   Patient Details  Name: Joe Wiggins MRN: [id number] Date of Birth: 04-Aug-1946  Transition of Care [facility]) CM/SW Contact:  Dessa Phi, RN Phone Number: 12/29/2020, 3:22 PM   Clinical Narrative:    [facility] for HHPT-start of care 01/02/21-MD notified-spouse agree.   Final next level of care: [place] Barriers to Discharge: No Barriers Identified   Patient Goals and CMS Choice Patient states their goals for this hospitalization and ongoing recovery are:: go home CMS Medicare.gov Compare Post Acute Care list provided to:: Patient Represenative (must comment) Choice offered to / list presented to : Spouse  Discharge Placement                       Discharge Plan and Services   Discharge Planning Services: CM Consult Post Acute Care Choice: Home Health                    HH Arranged: PT [facility] Agency: [facility] Date [facility] Agency Contacted: 12/29/20 Time [place]: [phone] Representative spoke with at [place]: [place] ([place]) Interventions     Readmission Risk Interventions No flowsheet data found.

## 2020-12-29 NOTE — Evaluation (Signed)
Physical Therapy Evaluation Patient Details Name: Joe Wiggins MRN: [id number] DOB: Nov 24, 1946 Today's Date: 12/29/2020   History of Present Illness  75 yo male admitted with dyspnea, weakness, fall at home (tripped over some equipment). Hx of CKD,COPD, OSA, dementia, PAF, CAD, CABG, pacemaker  Clinical Impression  On eval, pt was close Supv level for mobility. He walked ~125 feet around the unit. Unsteady at times but no LOB. O2 96% on RA at rest, 91% on RA during ambulation. Dyspnea 2/4. Pt and wife report pt has chronic dyspnea with activity. Pt already has O2 at home to use PRN. Discussed d/c plan-wife would like pt to have HHPT f/u for general strength and balance.     Follow Up Recommendations Home health PT;Supervision/Assistance - 24 hour    Equipment Recommendations  None recommended by PT    Recommendations for Other Services       Precautions / Restrictions Precautions Precautions: Fall Restrictions Weight Bearing Restrictions: No      Mobility  Bed Mobility               General bed mobility comments: oob in recliner    Transfers Overall transfer level: Needs assistance Equipment used: None Transfers: Sit to/from Stand Sit to Stand: Modified independent (Device/Increase time)            Ambulation/Gait Ambulation/Gait assistance: Supervision Gait Distance (Feet): 125 Feet Assistive device: None Gait Pattern/deviations: Step-through pattern;Decreased stride length     General Gait Details: slightly wide BOS with decreased step lengths bilaterally. No LOB. Ambulation distance limited by fatigue, dyspnea. O2 91% on RA.  Stairs            Wheelchair Mobility    Modified Rankin (Stroke Patients Only)       Balance Overall balance assessment: History of Falls;Needs assistance         Standing balance support: No upper extremity supported;During functional activity Standing balance-[name]: Fair                                Pertinent Vitals/Pain Pain Assessment: Faces Faces Pain Scale: Hurts a little bit Pain Location: soreness around ribcage Pain Descriptors / Indicators: Discomfort;Sore Pain Intervention(s): Monitored during session;Limited activity within patient's tolerance    Home Living Family/patient expects to be discharged to:: Private residence Living Arrangements: Spouse/significant other Available Help at Discharge: Family;Available 24 hours/day Type of Home: House       Home Layout: One level Home Equipment: Walker - 2 wheels      Prior Function Level of Independence: Independent               Hand Dominance        Extremity/Trunk Assessment   Upper Extremity Assessment Upper Extremity Assessment: Overall WFL for tasks assessed    Lower Extremity Assessment Lower Extremity Assessment: Generalized weakness    Cervical / Trunk Assessment Cervical / Trunk Assessment: Normal  Communication   Communication: HOH  Cognition Arousal/Alertness: Awake/alert Behavior During Therapy: WFL for tasks assessed/performed Overall Cognitive Status: Within Functional Limits for tasks assessed                                        General Comments      Exercises     Assessment/Plan    PT Assessment All further PT needs can be met  in the next venue of care (HHPT)  PT Problem List Decreased balance;Decreased activity tolerance       PT Treatment Interventions      PT Goals (Current goals can be found in the Care Plan section)  Acute Rehab PT Goals Patient Stated Goal: home today PT Goal Formulation: All assessment and education complete, DC therapy    Frequency     Barriers to discharge        Co-evaluation               AM-PAC PT "6 Clicks" Mobility  Outcome Measure Help needed turning from your back to your side while in a flat bed without using bedrails?: None Help needed moving from lying on your back to sitting on the side of a  flat bed without using bedrails?: None Help needed moving to and from a bed to a chair (including a wheelchair)?: None Help needed standing up from a chair using your arms (e.g., wheelchair or bedside chair)?: None Help needed to walk in hospital room?: A Little Help needed climbing 3-5 steps with a railing? : A Little 6 Click Score: 22    End of Session Equipment Utilized During Treatment: Gait belt Activity Tolerance: Patient tolerated treatment well Patient left: in chair;with call bell/phone within reach;with family/visitor present   PT Visit Diagnosis: Unsteadiness on feet (R26.81);History of falling (Z91.81)    Time: 1207-1222 PT Time Calculation (min) (ACUTE ONLY): 15 min   Charges:   PT Evaluation $PT Eval Low Complexity: [place], PT Acute Rehabilitation  Office: [phone] Pager: [phone]

## 2020-12-29 NOTE — Progress Notes (Signed)
Pt to be discharged to home this afternoon. Pt and Pt's Wife AVS discharge instructions reviewed with including discharge Medications and schedules for these discharge Medications listed on the AVS. Pt and Pt's Wife verbalized understanding. AVS with Pt and Pt's Wife at discharge

## 2020-12-30 ENCOUNTER — Other Ambulatory Visit: Payer: Self-pay

## 2021-01-02 DIAGNOSIS — I130 Hypertensive heart and chronic kidney disease with heart failure and stage 1 through stage 4 chronic kidney disease, or unspecified chronic kidney disease: Secondary | ICD-10-CM | POA: Diagnosis not present

## 2021-01-02 DIAGNOSIS — I6529 Occlusion and stenosis of unspecified carotid artery: Secondary | ICD-10-CM | POA: Diagnosis not present

## 2021-01-02 DIAGNOSIS — J9611 Chronic respiratory failure with hypoxia: Secondary | ICD-10-CM | POA: Diagnosis not present

## 2021-01-02 DIAGNOSIS — Z79899 Other long term (current) drug therapy: Secondary | ICD-10-CM | POA: Diagnosis not present

## 2021-01-02 DIAGNOSIS — K219 Gastro-esophageal reflux disease without esophagitis: Secondary | ICD-10-CM | POA: Diagnosis not present

## 2021-01-02 DIAGNOSIS — Z955 Presence of coronary angioplasty implant and graft: Secondary | ICD-10-CM | POA: Diagnosis not present

## 2021-01-02 DIAGNOSIS — I255 Ischemic cardiomyopathy: Secondary | ICD-10-CM | POA: Diagnosis not present

## 2021-01-02 DIAGNOSIS — I69354 Hemiplegia and hemiparesis following cerebral infarction affecting left non-dominant side: Secondary | ICD-10-CM | POA: Diagnosis not present

## 2021-01-02 DIAGNOSIS — Z950 Presence of cardiac pacemaker: Secondary | ICD-10-CM | POA: Diagnosis not present

## 2021-01-02 DIAGNOSIS — E785 Hyperlipidemia, unspecified: Secondary | ICD-10-CM | POA: Diagnosis not present

## 2021-01-02 DIAGNOSIS — D631 Anemia in chronic kidney disease: Secondary | ICD-10-CM | POA: Diagnosis not present

## 2021-01-02 DIAGNOSIS — Z87891 Personal history of nicotine dependence: Secondary | ICD-10-CM | POA: Diagnosis not present

## 2021-01-02 DIAGNOSIS — I480 Paroxysmal atrial fibrillation: Secondary | ICD-10-CM | POA: Diagnosis not present

## 2021-01-02 DIAGNOSIS — I252 Old myocardial infarction: Secondary | ICD-10-CM | POA: Diagnosis not present

## 2021-01-02 DIAGNOSIS — I2510 Atherosclerotic heart disease of native coronary artery without angina pectoris: Secondary | ICD-10-CM | POA: Diagnosis not present

## 2021-01-02 DIAGNOSIS — J441 Chronic obstructive pulmonary disease with (acute) exacerbation: Secondary | ICD-10-CM | POA: Diagnosis not present

## 2021-01-02 DIAGNOSIS — F0390 Unspecified dementia without behavioral disturbance: Secondary | ICD-10-CM | POA: Diagnosis not present

## 2021-01-02 DIAGNOSIS — G4733 Obstructive sleep apnea (adult) (pediatric): Secondary | ICD-10-CM | POA: Diagnosis not present

## 2021-01-02 DIAGNOSIS — Z7901 Long term (current) use of anticoagulants: Secondary | ICD-10-CM | POA: Diagnosis not present

## 2021-01-02 DIAGNOSIS — N1832 Chronic kidney disease, stage 3b: Secondary | ICD-10-CM | POA: Diagnosis not present

## 2021-01-02 DIAGNOSIS — Z95810 Presence of automatic (implantable) cardiac defibrillator: Secondary | ICD-10-CM | POA: Diagnosis not present

## 2021-01-02 DIAGNOSIS — I5022 Chronic systolic (congestive) heart failure: Secondary | ICD-10-CM | POA: Diagnosis not present

## 2021-01-02 DIAGNOSIS — Z951 Presence of aortocoronary bypass graft: Secondary | ICD-10-CM | POA: Diagnosis not present

## 2021-01-02 DIAGNOSIS — Z9181 History of falling: Secondary | ICD-10-CM | POA: Diagnosis not present

## 2021-01-04 ENCOUNTER — Telehealth: Payer: Self-pay | Admitting: Internal Medicine

## 2021-01-04 NOTE — Telephone Encounter (Signed)
STAT if HR is under 50 or over 120 (normal HR is 60-100 beats per minute)  1) What is your heart rate? 62  2) Do you have a log of your heart rate readings (document readings)?   43 (after coming out of the bathroom today)  3) Do you have any other symptoms? "swimmy headed" per wife    Wife says the patient gets weak any time he has to walk after getting up. Today was the first time his HR and O2 dropped. She wanted to know what to do. Please advise

## 2021-01-05 DIAGNOSIS — R0602 Shortness of breath: Secondary | ICD-10-CM | POA: Diagnosis not present

## 2021-01-05 NOTE — Progress Notes (Signed)
Cardiology Office Note:    Date:  01/06/2021   ID:  Joe Wiggins, DOB [birth year], MRN [id number]  PCP:  Ezequiel Essex, MD  Cardiologist:  No primary care provider on file.  Electrophysiologist:  Cristopher Peru, MD   Referring MD: Ezequiel Essex, MD   Chief Complaint  Patient presents with  . Congestive Heart Failure    History of Present Illness:    Joe Wiggins is a 75 y.o. male with a hx of ischemic cardiomyopathy status post ICD, atrial fibrillation on Eliquis and amiodarone, COPD, OSA, CVA, hypertension, hyperlipidemia, CKD stage IIIb who presents for hospital follow-up.  He was admitted from 1/2 through 12/29/2020 after presenting with worsening shortness of breath.  He also had a fall.  Etiology of dyspnea felt to be secondary to COPD and heart failure exacerbation.  He was treated with prednisone and doxycycline and received diuresis.  Echocardiogram on 12/28/2020 showed LVEF 45 to 50%, septal hypokinesis and dyssynchrony consistent with RV pacing, concentric LVH, grade 2 diastolic dysfunction.  He reports that he is not having lightheadedness with standing.  BP 150s to 160s at home.  Denies any syncope.  Denies any chest pain but reports dyspnea with minimal exertion.  Has been monitoring weights, reports has been stable.  Does note occasional lower extremity edema.  Has been taking Eliquis, reports some epistaxis but denies any other bleeding issues    Wt Readings from Last 3 Encounters:  12/29/20 169 lb 8.5 oz (76.9 kg)  09/09/20 180 lb 6.4 oz (81.8 kg)  07/31/20 182 lb (82.6 kg)    Past Medical History:  Diagnosis Date  . AF (paroxysmal atrial fibrillation) ([place])   . AICD (automatic cardioverter/defibrillator) present   . CAD (coronary artery disease)   . Cardiomyopathy, ischemic   . Carotid artery occlusion   . Chronic kidney disease   . COPD (chronic obstructive pulmonary disease) ([place])   . Dementia without behavioral disturbance ([place])   . GERD (gastroesophageal reflux  disease)   . Hyperlipidemia   . Hypertension   . Sleep apnea   . Stroke ([place])   . TIA (transient ischemic attack)     Past Surgical History:  Procedure Laterality Date  . CARDIAC PACEMAKER PLACEMENT    . CATARACT EXTRACTION    . CORONARY ARTERY BYPASS GRAFT    . CORONARY STENT PLACEMENT    . EYE SURGERY    . ICD IMPLANT N/A 12/29/2017   Procedure: ICD IMPLANT;  Surgeon: Evans Lance, MD;  Location: [facility];  Service: Cardiovascular;  Laterality: N/A;  . LEFT HEART CATH AND CORS/GRAFTS ANGIOGRAPHY N/A 12/29/2017   Procedure: LEFT HEART CATH AND CORS/GRAFTS ANGIOGRAPHY;  Surgeon: Martinique, Peter M, MD;  Location: [facility];  Service: Cardiovascular;  Laterality: N/A;    Current Medications: Current Meds  Medication Sig  . albuterol (ACCUNEB) 0.63 MG/3ML nebulizer solution Take 3 mLs by nebulization 4 (four) times daily as needed for shortness of breath or wheezing. 3 ml inhale orally via nebulizer four times a day  . albuterol (PROVENTIL HFA;VENTOLIN HFA) 108 (90 Base) MCG/ACT inhaler Inhale 2 puffs into the lungs every 6 (six) hours as needed for wheezing or shortness of breath.  Marland Kitchen amiodarone (PACERONE) 200 MG tablet Take 200 mg by mouth daily. Take 1 Tablet Daily  . apixaban (ELIQUIS) 5 MG TABS tablet Take 1 tablet (5 mg total) by mouth 2 (two) times daily.  Marland Kitchen ascorbic acid (VITAMIN C) 500 MG tablet Take 500 mg by  mouth daily.  Marland Kitchen atorvastatin (LIPITOR) 40 MG tablet Take 1 tablet (40 mg total) by mouth daily.  . cholecalciferol (VITAMIN D) 1000 units tablet Take 1,000 Units by mouth daily.  . Cyanocobalamin (VITAMIN B 12 PO) Take 1 tablet by mouth daily.  . ferrous sulfate 325 (65 FE) MG tablet Take 325 mg by mouth daily with breakfast.  . Fluticasone-Umeclidin-Vilant (TRELEGY ELLIPTA) 100-62.5-25 MCG/INH AEPB Inhale 1 puff into the lungs in the morning.  . folic acid (FOLVITE) 829 MCG tablet Take 400 mcg by mouth daily.  Marland Kitchen labetalol (NORMODYNE) 300 MG tablet Take 1  tablet (300 mg total) by mouth 2 (two) times daily.  . Multiple Vitamin (MULTIVITAMIN) tablet Take 1 tablet by mouth daily.  . mupirocin ointment (BACTROBAN) 2 % Apply 1 application topically 2 (two) times daily. To buttocks  . nitroGLYCERIN (NITROSTAT) 0.4 MG SL tablet Place 1 tablet (0.4 mg total) under the tongue every 5 (five) minutes as needed for chest pain.  Marland Kitchen omeprazole (PRILOSEC) 40 MG capsule Take 40 mg by mouth daily as needed (indigestion/heartburn).  . torsemide (DEMADEX) 20 MG tablet Take 40 mg by mouth daily. 1 Tablet Daily  . [DISCONTINUED] torsemide (DEMADEX) 20 MG tablet Take 2 tablets in the AM and 2 tablets in the PM (Patient taking differently: Take 20-40 mg by mouth See admin instructions. Take 40mg  in the AM and 20mg  in the PM)     Allergies:   Ipratropium, Metoprolol, Morphine and related, Vicodin [hydrocodone-acetaminophen], and Penicillins   Social History   Socioeconomic History  . Marital status: Married    Spouse name: Not on file  . Number of children: Not on file  . Years of education: Not on file  . Highest education level: Not on file  Occupational History  . Not on file  Tobacco Use  . Smoking status: Former Smoker    Types: Cigarettes    Quit date: 11/30/2012    Years since quitting: 8.1  . Smokeless tobacco: Never Used  Vaping Use  . Vaping Use: Never used  Substance and Sexual Activity  . Alcohol use: No  . Drug use: No  . Sexual activity: Not on file  Other Topics Concern  . Not on file  Social History Narrative  . Not on file   Social Determinants of Health   Financial Resource Strain: Not on file  Food Insecurity: No Food Insecurity  . Worried About Charity fundraiser in the Last Year: Never true  . Ran Out of Food in the Last Year: Never true  Transportation Needs: No Transportation Needs  . Lack of Transportation (Medical): No  . Lack of Transportation (Non-Medical): No  Physical Activity: Not on file  Stress: Not on file   Social Connections: Not on file     Family History: The patient's family history includes Heart disease in his mother and sister; Hypertension in his mother and sister; Thyroid disease in his brother and sister.  ROS:   Please see the history of present illness.    All other systems reviewed and are negative.  EKGs/Labs/Other Studies Reviewed:    The following studies were reviewed today:   EKG:  EKG is not ordered today.    Recent Labs: 07/31/2020: ALT 25 12/27/2020: B Natriuretic Peptide 266.2 12/28/2020: Magnesium 2.3; TSH 0.020 12/29/2020: BUN 64; Creatinine, Ser 2.60; Hemoglobin 10.0; Platelets 151; Potassium 4.1; Sodium 139  Recent Lipid Panel    Component Value Date/Time   CHOL 146 12/26/2017 0845  TRIG 166 (H) 12/26/2017 0845   HDL 32 (L) 12/26/2017 0845   CHOLHDL 4.6 12/26/2017 0845   VLDL 33 12/26/2017 0845   LDLCALC 81 12/26/2017 0845    Physical Exam:    VS:  BP (!) 134/57 (BP Location: Right Arm, Patient Position: Sitting, Cuff Size: Large)   Pulse 65   SpO2 94%     Wt Readings from Last 3 Encounters:  12/29/20 169 lb 8.5 oz (76.9 kg)  09/09/20 180 lb 6.4 oz (81.8 kg)  07/31/20 182 lb (82.6 kg)     GEN:  in no acute distress HEENT: Normal NECK: No JVD CARDIAC: RRR, 2 out of 6 systolic murmur RESPIRATORY: Diminished breath sounds on left side ABDOMEN: Soft, non-tender, non-distended MUSCULOSKELETAL:  No edema SKIN: Warm and dry NEUROLOGIC:  Alert and oriented x 3 PSYCHIATRIC:  Normal affect   ASSESSMENT:    1. Chronic combined systolic and diastolic heart failure ([place])   2. Atrial fibrillation, unspecified type ([place])   3. Essential hypertension   4. Hyperlipidemia, unspecified hyperlipidemia type   5. Low TSH level   6. Shortness of breath    PLAN:    Chronic combined systolic and diastolic heart failure: Status post CABG (LIMA-LAD, SVG-D1, SVG-OM, SVG-PDA as sequential graft) on 06/18/2013.  Most recent cath 12/29/2017 showed severe three-vessel  occlusive CAD, patent LIMA-LAD, patent sequential SVG- OM and PDA, patent Y graft SVG-diagonal.  Echocardiogram 12/28/2020 with EF 45 to 91%, grade 2 diastolic dysfunction, septal hypokinesis and dyssynchrony consistent with RV pacing.  Status post AICD placement -Follows with the EP for ICD management -Having lightheadedness with standing, orthostatics unremarkable in clinic today, but will decrease torsemide to 40 mg once daily.  Asked patient to monitor daily weights, can increase to twice daily torsemide if gains more than 3 pounds in 1 day or 5 pounds in 1 week -Check CMP, BNP, CXR  Atrial fibrillation: CHA2DS2-VASc score 7 (CHF, hypertension, age x2, CVA x2, CAD) -Continue Eliquis -Continue amiodarone -Follows with EP  CAD: Status post CABG as above.  Continue Eliquis and statin.  Hypertension: On labetalol 300 mg twice daily  CKD stage IIIb: Baseline creatinine around 2.2, was up to 3.0 on admission on 1/2, was 2.6 on discharge.  Follows with nephrology  Hyperlipidemia: Continue atorvastatin 40 mg daily.  Will check lipid panel  Abnormal TSH: Low TSH on recent labs, will check TSH/free T4  OSA: On CPAP  RTC in 1 month   Medication Adjustments/Labs and Tests Ordered: Current medicines are reviewed at length with the patient today.  Concerns regarding medicines are outlined above.  Orders Placed This Encounter  Procedures  . DG Chest 2 View  . Comprehensive metabolic panel  . CBC  . Lipid panel  . TSH  . Brain natriuretic peptide  . T4, free   No orders of the defined types were placed in this encounter.   Patient Instructions  Medication Instructions:  DECREASE torsemide to 40 mg daily  --weigh yourself every morning and write it down, call if you gain 3 lbs overnight or 5 lbs total in 1 week.    *If you need a refill on your cardiac medications before your next appointment, please call your pharmacy*   Lab Work: CMET, CBC, BNP, Lipid, TSH, freeT4 today  If  you have labs (blood work) drawn today and your tests are completely normal, you will receive your results only by: Marland Kitchen MyChart Message (if you have MyChart) OR . A paper copy in the  mail If you have any lab test that is abnormal or we need to change your treatment, we will call you to review the results.   Testing/Procedures: A chest x-ray takes a picture of the organs and structures inside the chest, including the heart, lungs, and blood vessels. This test can show several things, including, whether the heart is enlarges; whether fluid is building up in the lungs; and whether pacemaker / defibrillator leads are still in place.  Follow-Up: At [facility], you and your health needs are our priority.  As part of our continuing mission to provide you with exceptional heart care, we have created designated Provider Care Teams.  These Care Teams include your primary Cardiologist (physician) and Advanced Practice Providers (APPs -  Physician Assistants and Nurse Practitioners) who all work together to provide you with the care you need, when you need it.  We recommend signing up for the patient portal called "MyChart".  Sign up information is provided on this After Visit Summary.  MyChart is used to connect with patients for Virtual Visits (Telemedicine).  Patients are able to view lab/test results, encounter notes, upcoming appointments, etc.  Non-urgent messages can be sent to your provider as well.   To learn more about what you can do with MyChart, go to [email or web address].    Your next appointment:   1 month(s)  The format for your next appointment:   In Person  Provider:   Oswaldo Milian, MD         Signed, Donato Heinz, MD  01/06/2021 11:57 PM    [place]

## 2021-01-05 NOTE — Telephone Encounter (Signed)
[name] is following up, as she has not heard from anyone regarding patient's symptoms on yesterday, 01/04/21. Please call.

## 2021-01-05 NOTE — Telephone Encounter (Signed)
Remote transmission shows device function WNL, with presenting EGM of AP/ VS 70's with FF. Corvue trending up. No episodes of high A-rates or high V-rates.

## 2021-01-05 NOTE — Telephone Encounter (Signed)
Spoke with pt's wife, DPR who reports pt is having SOB, dizziness and weakness on exertion.  She reports she has seen pt's O2 drop into the 40's and 50's.  Pt's current resting HR 63 and O2 is 98. Pt was recently discharged from the hospital for mild CHF exacerbation.  Pt has hx of COPD and wears O2 at night.  Requested pt walk hall of their home and back if pt is able to do safely.  Pt's O2 remained at 98 and HR up to 86 per report.  Pt's wife states his O2 drops lowest when he is dressing himself.  Pt is taking medications as prescribed.  Requested pt send remote device transmission to be reviewed and he is agreeable.  Will forward information to Dr Lovena Le for review.  Reviewed ED precautions and pt's wife verbalizes understanding.

## 2021-01-05 NOTE — Telephone Encounter (Signed)
Discussed with Dr. Lovena Le.  Will have Pt establish with Dr. Gardiner Rhyme to manage heart failure s/p hospitalization.  Appt made for 01/06/2021.

## 2021-01-06 ENCOUNTER — Encounter: Payer: Self-pay | Admitting: Cardiology

## 2021-01-06 ENCOUNTER — Ambulatory Visit: Payer: HMO | Admitting: Family Medicine

## 2021-01-06 ENCOUNTER — Other Ambulatory Visit: Payer: Self-pay

## 2021-01-06 ENCOUNTER — Ambulatory Visit
Admission: RE | Admit: 2021-01-06 | Discharge: 2021-01-06 | Disposition: A | Discharge status: Home / Self Care | Payer: HMO | Source: Ambulatory Visit | Attending: Cardiology | Admitting: Cardiology

## 2021-01-06 ENCOUNTER — Ambulatory Visit (INDEPENDENT_AMBULATORY_CARE_PROVIDER_SITE_OTHER): Payer: HMO | Admitting: Cardiology

## 2021-01-06 VITALS — BP 134/57 | HR 65

## 2021-01-06 DIAGNOSIS — I4891 Unspecified atrial fibrillation: Secondary | ICD-10-CM

## 2021-01-06 DIAGNOSIS — I5042 Chronic combined systolic (congestive) and diastolic (congestive) heart failure: Secondary | ICD-10-CM

## 2021-01-06 DIAGNOSIS — E785 Hyperlipidemia, unspecified: Secondary | ICD-10-CM | POA: Diagnosis not present

## 2021-01-06 DIAGNOSIS — I10 Essential (primary) hypertension: Secondary | ICD-10-CM | POA: Diagnosis not present

## 2021-01-06 DIAGNOSIS — R0602 Shortness of breath: Secondary | ICD-10-CM | POA: Diagnosis not present

## 2021-01-06 DIAGNOSIS — R7989 Other specified abnormal findings of blood chemistry: Secondary | ICD-10-CM

## 2021-01-06 NOTE — Patient Instructions (Signed)
Medication Instructions:  DECREASE torsemide to 40 mg daily  --weigh yourself every morning and write it down, call if you gain 3 lbs overnight or 5 lbs total in 1 week.    *If you need a refill on your cardiac medications before your next appointment, please call your pharmacy*   Lab Work: CMET, CBC, BNP, Lipid, TSH, freeT4 today  If you have labs (blood work) drawn today and your tests are completely normal, you will receive your results only by: Marland Kitchen MyChart Message (if you have MyChart) OR . A paper copy in the mail If you have any lab test that is abnormal or we need to change your treatment, we will call you to review the results.   Testing/Procedures: A chest x-ray takes a picture of the organs and structures inside the chest, including the heart, lungs, and blood vessels. This test can show several things, including, whether the heart is enlarges; whether fluid is building up in the lungs; and whether pacemaker / defibrillator leads are still in place.  Follow-Up: At [facility], you and your health needs are our priority.  As part of our continuing mission to provide you with exceptional heart care, we have created designated Provider Care Teams.  These Care Teams include your primary Cardiologist (physician) and Advanced Practice Providers (APPs -  Physician Assistants and Nurse Practitioners) who all work together to provide you with the care you need, when you need it.  We recommend signing up for the patient portal called "MyChart".  Sign up information is provided on this After Visit Summary.  MyChart is used to connect with patients for Virtual Visits (Telemedicine).  Patients are able to view lab/test results, encounter notes, upcoming appointments, etc.  Non-urgent messages can be sent to your provider as well.   To learn more about what you can do with MyChart, go to [email or web address].    Your next appointment:   1 month(s)  The format for your next appointment:    In Person  Provider:   Oswaldo Milian, MD

## 2021-01-06 NOTE — Progress Notes (Incomplete)
Cardiology Office Note:    Date:  01/06/2021   ID:  Joe Wiggins, DOB 24-Mar-1946, MRN [id number]  PCP:  Ezequiel Essex, MD  Cardiologist:  No primary care provider on file.  Electrophysiologist:  Cristopher Peru, MD   Referring MD: Ezequiel Essex, MD   Chief Complaint  Patient presents with  . Congestive Heart Failure    History of Present Illness:    Joe Wiggins is a 75 y.o. male with a hx of ischemic cardiomyopathy status post ICD, atrial fibrillation on Eliquis and amiodarone, COPD, OSA, CVA, hypertension, hyperlipidemia, CKD stage IIIb who presents for hospital follow-up.  He was admitted from 1/2 through 12/29/2020 after presenting with worsening shortness of breath.  He also had a fall.  Etiology of dyspnea felt to be secondary to COPD and heart failure exacerbation.  He was treated with prednisone and doxycycline and received diuresis.  Echocardiogram on 12/28/2020 showed LVEF 45 to 50%, septal hypokinesis and dyssynchrony consistent with RV pacing, concentric LVH, grade 2 diastolic dysfunction.  He reports that he is not having lightheadedness with standing.  BP 150s to 160s at home.  Denies any syncope.  Denies any chest pain but reports dyspnea with minimal exertion.  Has been monitoring weights, reports has been stable.  Does note occasional lower extremity edema.  Has been taking Eliquis, reports some epistaxis but denies any other bleeding issues    Wt Readings from Last 3 Encounters:  12/29/20 169 lb 8.5 oz (76.9 kg)  09/09/20 180 lb 6.4 oz (81.8 kg)  07/31/20 182 lb (82.6 kg)    Past Medical History:  Diagnosis Date  . AF (paroxysmal atrial fibrillation) ([place])   . AICD (automatic cardioverter/defibrillator) present   . CAD (coronary artery disease)   . Cardiomyopathy, ischemic   . Carotid artery occlusion   . Chronic kidney disease   . COPD (chronic obstructive pulmonary disease) ([place])   . Dementia without behavioral disturbance ([place])   . GERD (gastroesophageal reflux  disease)   . Hyperlipidemia   . Hypertension   . Sleep apnea   . Stroke ([place])   . TIA (transient ischemic attack)     Past Surgical History:  Procedure Laterality Date  . CARDIAC PACEMAKER PLACEMENT    . CATARACT EXTRACTION    . CORONARY ARTERY BYPASS GRAFT    . CORONARY STENT PLACEMENT    . EYE SURGERY    . ICD IMPLANT N/A 12/29/2017   Procedure: ICD IMPLANT;  Surgeon: Evans Lance, MD;  Location: [facility];  Service: Cardiovascular;  Laterality: N/A;  . LEFT HEART CATH AND CORS/GRAFTS ANGIOGRAPHY N/A 12/29/2017   Procedure: LEFT HEART CATH AND CORS/GRAFTS ANGIOGRAPHY;  Surgeon: Martinique, Peter M, MD;  Location: [facility];  Service: Cardiovascular;  Laterality: N/A;    Current Medications: Current Meds  Medication Sig  . albuterol (ACCUNEB) 0.63 MG/3ML nebulizer solution Take 3 mLs by nebulization 4 (four) times daily as needed for shortness of breath or wheezing. 3 ml inhale orally via nebulizer four times a day  . albuterol (PROVENTIL HFA;VENTOLIN HFA) 108 (90 Base) MCG/ACT inhaler Inhale 2 puffs into the lungs every 6 (six) hours as needed for wheezing or shortness of breath.  Marland Kitchen amiodarone (PACERONE) 200 MG tablet Take 200 mg by mouth daily. Take 1 Tablet Daily  . apixaban (ELIQUIS) 5 MG TABS tablet Take 1 tablet (5 mg total) by mouth 2 (two) times daily.  Marland Kitchen ascorbic acid (VITAMIN C) 500 MG tablet Take 500 mg by  mouth daily.  Marland Kitchen atorvastatin (LIPITOR) 40 MG tablet Take 1 tablet (40 mg total) by mouth daily.  . cholecalciferol (VITAMIN D) 1000 units tablet Take 1,000 Units by mouth daily.  . Cyanocobalamin (VITAMIN B 12 PO) Take 1 tablet by mouth daily.  . ferrous sulfate 325 (65 FE) MG tablet Take 325 mg by mouth daily with breakfast.  . Fluticasone-Umeclidin-Vilant (TRELEGY ELLIPTA) 100-62.5-25 MCG/INH AEPB Inhale 1 puff into the lungs in the morning.  . folic acid (FOLVITE) 616 MCG tablet Take 400 mcg by mouth daily.  Marland Kitchen labetalol (NORMODYNE) 300 MG tablet Take 1  tablet (300 mg total) by mouth 2 (two) times daily.  . Multiple Vitamin (MULTIVITAMIN) tablet Take 1 tablet by mouth daily.  . mupirocin ointment (BACTROBAN) 2 % Apply 1 application topically 2 (two) times daily. To buttocks  . nitroGLYCERIN (NITROSTAT) 0.4 MG SL tablet Place 1 tablet (0.4 mg total) under the tongue every 5 (five) minutes as needed for chest pain.  Marland Kitchen omeprazole (PRILOSEC) 40 MG capsule Take 40 mg by mouth daily as needed (indigestion/heartburn).  . torsemide (DEMADEX) 20 MG tablet Take 40 mg by mouth daily. 1 Tablet Daily  . [DISCONTINUED] torsemide (DEMADEX) 20 MG tablet Take 2 tablets in the AM and 2 tablets in the PM (Patient taking differently: Take 20-40 mg by mouth See admin instructions. Take 40mg  in the AM and 20mg  in the PM)     Allergies:   Ipratropium, Metoprolol, Morphine and related, Vicodin [hydrocodone-acetaminophen], and Penicillins   Social History   Socioeconomic History  . Marital status: Married    Spouse name: Not on file  . Number of children: Not on file  . Years of education: Not on file  . Highest education level: Not on file  Occupational History  . Not on file  Tobacco Use  . Smoking status: Former Smoker    Types: Cigarettes    Quit date: 11/30/2012    Years since quitting: 8.1  . Smokeless tobacco: Never Used  Vaping Use  . Vaping Use: Never used  Substance and Sexual Activity  . Alcohol use: No  . Drug use: No  . Sexual activity: Not on file  Other Topics Concern  . Not on file  Social History Narrative  . Not on file   Social Determinants of Health   Financial Resource Strain: Not on file  Food Insecurity: No Food Insecurity  . Worried About Charity fundraiser in the Last Year: Never true  . Ran Out of Food in the Last Year: Never true  Transportation Needs: No Transportation Needs  . Lack of Transportation (Medical): No  . Lack of Transportation (Non-Medical): No  Physical Activity: Not on file  Stress: Not on file   Social Connections: Not on file     Family History: The patient's family history includes Heart disease in his mother and sister; Hypertension in his mother and sister; Thyroid disease in his brother and sister.  ROS:   Please see the history of present illness.    All other systems reviewed and are negative.  EKGs/Labs/Other Studies Reviewed:    The following studies were reviewed today:   EKG:  EKG is not ordered today.    Recent Labs: 07/31/2020: ALT 25 12/27/2020: B Natriuretic Peptide 266.2 12/28/2020: Magnesium 2.3; TSH 0.020 12/29/2020: BUN 64; Creatinine, Ser 2.60; Hemoglobin 10.0; Platelets 151; Potassium 4.1; Sodium 139  Recent Lipid Panel    Component Value Date/Time   CHOL 146 12/26/2017 0845  TRIG 166 (H) 12/26/2017 0845   HDL 32 (L) 12/26/2017 0845   CHOLHDL 4.6 12/26/2017 0845   VLDL 33 12/26/2017 0845   LDLCALC 81 12/26/2017 0845    Physical Exam:    VS:  BP (!) 134/57 (BP Location: Right Arm, Patient Position: Sitting, Cuff Size: Large)   Pulse 65   SpO2 94%     Wt Readings from Last 3 Encounters:  12/29/20 169 lb 8.5 oz (76.9 kg)  09/09/20 180 lb 6.4 oz (81.8 kg)  07/31/20 182 lb (82.6 kg)     GEN:  in no acute distress HEENT: Normal NECK: No JVD CARDIAC: ***RRR, no murmurs, rubs, gallops RESPIRATORY:  Clear to auscultation without rales, wheezing or rhonchi  ABDOMEN: Soft, non-tender, non-distended MUSCULOSKELETAL:  No edema; No deformity  SKIN: Warm and dry NEUROLOGIC:  Alert and oriented x 3 PSYCHIATRIC:  Normal affect   ASSESSMENT:    1. Chronic combined systolic and diastolic heart failure ([place])   2. Atrial fibrillation, unspecified type ([place])   3. Essential hypertension   4. Hyperlipidemia, unspecified hyperlipidemia type   5. Low TSH level   6. Shortness of breath    PLAN:    Chronic combined systolic and diastolic heart failure: Status post CABG (LIMA-LAD, SVG-D1, SVG-OM, SVG-PDA as sequential graft) on 06/18/2013.  Most recent  cath 12/29/2017 showed severe three-vessel occlusive CAD, patent LIMA-LAD, patent sequential SVG- OM and PDA, patent Y graft SVG-diagonal.  Echocardiogram 12/28/2020 with EF 45 to 27%, grade 2 diastolic dysfunction, septal hypokinesis and dyssynchrony consistent with RV pacing.  Status post AICD placement -Follows with the EP for ICD management -Continue torsemide 40 mg twice daily  Atrial fibrillation: CHA2DS2-VASc score 7 (CHF, hypertension, age x2, CVA x2, CAD) -Continue Eliquis -Continue amiodarone -Follows with EP  CAD: Status post CABG as above.  Continue Eliquis and statin.  Hypertension: On labetalol 300 mg twice daily  CKD stage IIIb: Baseline creatinine around 2.2, was up to 3.0 on admission on 1/2, was 2.6 on discharge.  Follows with nephrology  Hyperlipidemia: Continue atorvastatin 40 mg daily  OSA: On CPAP  RTC in***   Medication Adjustments/Labs and Tests Ordered: Current medicines are reviewed at length with the patient today.  Concerns regarding medicines are outlined above.  Orders Placed This Encounter  Procedures  . DG Chest 2 View  . Comprehensive metabolic panel  . CBC  . Lipid panel  . TSH  . Brain natriuretic peptide  . T4, free   No orders of the defined types were placed in this encounter.   Patient Instructions  Medication Instructions:  DECREASE torsemide to 40 mg daily  --weigh yourself every morning and write it down, call if you gain 3 lbs overnight or 5 lbs total in 1 week.    *If you need a refill on your cardiac medications before your next appointment, please call your pharmacy*   Lab Work: CMET, CBC, BNP, Lipid, TSH, freeT4 today  If you have labs (blood work) drawn today and your tests are completely normal, you will receive your results only by: Marland Kitchen MyChart Message (if you have MyChart) OR . A paper copy in the mail If you have any lab test that is abnormal or we need to change your treatment, we will call you to review the  results.   Testing/Procedures: A chest x-ray takes a picture of the organs and structures inside the chest, including the heart, lungs, and blood vessels. This test can show several things, including, whether the  heart is enlarges; whether fluid is building up in the lungs; and whether pacemaker / defibrillator leads are still in place.  Follow-Up: At [facility], you and your health needs are our priority.  As part of our continuing mission to provide you with exceptional heart care, we have created designated Provider Care Teams.  These Care Teams include your primary Cardiologist (physician) and Advanced Practice Providers (APPs -  Physician Assistants and Nurse Practitioners) who all work together to provide you with the care you need, when you need it.  We recommend signing up for the patient portal called "MyChart".  Sign up information is provided on this After Visit Summary.  MyChart is used to connect with patients for Virtual Visits (Telemedicine).  Patients are able to view lab/test results, encounter notes, upcoming appointments, etc.  Non-urgent messages can be sent to your provider as well.   To learn more about what you can do with MyChart, go to [email or web address].    Your next appointment:   1 month(s)  The format for your next appointment:   In Person  Provider:   Oswaldo Milian, MD         Signed, Donato Heinz, MD  01/06/2021 11:57 PM    [place]

## 2021-01-07 DIAGNOSIS — I480 Paroxysmal atrial fibrillation: Secondary | ICD-10-CM | POA: Diagnosis not present

## 2021-01-07 DIAGNOSIS — I5020 Unspecified systolic (congestive) heart failure: Secondary | ICD-10-CM | POA: Diagnosis not present

## 2021-01-07 DIAGNOSIS — I130 Hypertensive heart and chronic kidney disease with heart failure and stage 1 through stage 4 chronic kidney disease, or unspecified chronic kidney disease: Secondary | ICD-10-CM | POA: Diagnosis not present

## 2021-01-07 DIAGNOSIS — J441 Chronic obstructive pulmonary disease with (acute) exacerbation: Secondary | ICD-10-CM | POA: Diagnosis not present

## 2021-01-07 LAB — COMPREHENSIVE METABOLIC PANEL
ALT: 48 IU/L — ABNORMAL HIGH (ref 0–44)
AST: 35 IU/L (ref 0–40)
Albumin/Globulin Ratio: 1.7 (ref 1.2–2.2)
Albumin: 3.2 g/dL — ABNORMAL LOW (ref 3.7–4.7)
Alkaline Phosphatase: 112 IU/L (ref 44–121)
BUN/Creatinine Ratio: 20 (ref 10–24)
BUN: 51 mg/dL — ABNORMAL HIGH (ref 8–27)
Bilirubin Total: 0.6 mg/dL (ref 0.0–1.2)
CO2: 22 mmol/L (ref 20–29)
Calcium: 9.1 mg/dL (ref 8.6–10.2)
Chloride: 99 mmol/L (ref 96–106)
Creatinine, Ser: 2.51 mg/dL — ABNORMAL HIGH (ref 0.76–1.27)
GFR calc Af Amer: 28 mL/min/{1.73_m2} — ABNORMAL LOW (ref >59)
GFR calc non Af Amer: 24 mL/min/{1.73_m2} — ABNORMAL LOW (ref >59)
Globulin, Total: 1.9 g/dL (ref 1.5–4.5)
Glucose: 111 mg/dL — ABNORMAL HIGH (ref 65–99)
Potassium: 4.1 mmol/L (ref 3.5–5.2)
Sodium: 136 mmol/L (ref 134–144)
Total Protein: 5.1 g/dL — ABNORMAL LOW (ref 6.0–8.5)

## 2021-01-07 LAB — T4, FREE: Free T4: 2.19 ng/dL — ABNORMAL HIGH (ref 0.82–1.77)

## 2021-01-07 LAB — CBC
Hematocrit: 31 % — ABNORMAL LOW (ref 37.5–51.0)
Hemoglobin: 10.8 g/dL — ABNORMAL LOW (ref 13.0–17.7)
MCH: 30.5 pg (ref 26.6–33.0)
MCHC: 34.8 g/dL (ref 31.5–35.7)
MCV: 88 fL (ref 79–97)
Platelets: 174 10*3/uL (ref 150–450)
RBC: 3.54 x10E6/uL — ABNORMAL LOW (ref 4.14–5.80)
RDW: 14.1 % (ref 11.6–15.4)
WBC: 6.3 10*3/uL (ref 3.4–10.8)

## 2021-01-07 LAB — LIPID PANEL
Chol/HDL Ratio: 1.9 ratio (ref 0.0–5.0)
Cholesterol, Total: 104 mg/dL (ref 100–199)
HDL: 54 mg/dL (ref >39)
LDL Chol Calc (NIH): 36 mg/dL (ref 0–99)
Triglycerides: 65 mg/dL (ref 0–149)
VLDL Cholesterol Cal: 14 mg/dL (ref 5–40)

## 2021-01-07 LAB — TSH: TSH: 0.011 u[IU]/mL — ABNORMAL LOW (ref 0.450–4.500)

## 2021-01-07 LAB — BRAIN NATRIURETIC PEPTIDE: BNP: 344.8 pg/mL — ABNORMAL HIGH (ref 0.0–100.0)

## 2021-01-08 ENCOUNTER — Ambulatory Visit (INDEPENDENT_AMBULATORY_CARE_PROVIDER_SITE_OTHER): Payer: HMO | Admitting: Family Medicine

## 2021-01-08 ENCOUNTER — Other Ambulatory Visit: Payer: Self-pay

## 2021-01-08 ENCOUNTER — Encounter: Payer: Self-pay | Admitting: Family Medicine

## 2021-01-08 VITALS — BP 130/80 | Ht 70.0 in | Wt 175.8 lb

## 2021-01-08 DIAGNOSIS — I509 Heart failure, unspecified: Secondary | ICD-10-CM

## 2021-01-08 DIAGNOSIS — Z1211 Encounter for screening for malignant neoplasm of colon: Secondary | ICD-10-CM | POA: Insufficient documentation

## 2021-01-08 DIAGNOSIS — R5381 Other malaise: Secondary | ICD-10-CM | POA: Diagnosis not present

## 2021-01-08 DIAGNOSIS — Z1159 Encounter for screening for other viral diseases: Secondary | ICD-10-CM

## 2021-01-08 DIAGNOSIS — F0150 Vascular dementia without behavioral disturbance: Secondary | ICD-10-CM | POA: Diagnosis not present

## 2021-01-08 DIAGNOSIS — D631 Anemia in chronic kidney disease: Secondary | ICD-10-CM | POA: Diagnosis not present

## 2021-01-08 DIAGNOSIS — E0590 Thyrotoxicosis, unspecified without thyrotoxic crisis or storm: Secondary | ICD-10-CM | POA: Diagnosis not present

## 2021-01-08 DIAGNOSIS — N1832 Chronic kidney disease, stage 3b: Secondary | ICD-10-CM | POA: Diagnosis not present

## 2021-01-08 NOTE — Assessment & Plan Note (Signed)
No colonoscopy record in our chart system.  Per wife, patient last colonoscopy in hospital at [facility] regional in 2014 and 2015.  Will obtain records.  Wife reports that the colonoscopy revealed diverticulosis but no polyps.  Instructed to do normal 10-year follow-up.

## 2021-01-08 NOTE — Assessment & Plan Note (Addendum)
Cardiologist obtained thyroid labs 1/12; TSH 0.011, free T4 measured 2.19.  Consistent with hypothyroidism.  Of note, patient is currently on amiodarone per cardiology. TSH previously measured on low end of normal 2017-2020; however since 8/6 as measured quite low, ranging 0.011-0.060.  TSH measurement on 1/12 lowest in our records.  Wife notes his thyroid was previously evaluated with ultrasound and "nothing was found", unknown year. -Thyroid antibody lab today -Referral to endocrinology

## 2021-01-08 NOTE — Progress Notes (Signed)
SUBJECTIVE:   CHIEF COMPLAINT / HPI:   Hospital follow up Mr. Carrier was admitted to the hospital January 2 - 4 for acute respiratory distress likely secondary to COPD and heart failure exacerbation.  Was treated with IV diuresis, prednisone, doxycycline, and supplemental oxygen.  Weaned off supplemental oxygen by discharge.  Patient reports that he is actually feeling worse since discharge in the hospital.  Wife chimed in and said that he has been just so weak and fatigued.  This weakness and fatigue is worse since discharge but has been gradually worsening for about a month. She requests orders for a wheelchair and bedside commode to assist her in caring for him at this time.  She reports that sometimes he is so weak at bedtime as she is scared to go to sleep.  Has already seen his pulmonologist on 1/13 and cardiologist on 1/12.  Is scheduled to see his nephrologist, Dr. Carmina Miller at [facility], on 2/7.  PERTINENT  PMH / PSH: CHF, COPD, CKD stage III, hyperlipidemia, history of TIA and stroke, ICD/pacemaker in place, vascular dementia, CAD, paroxysmal A. fib, hypertension, bilateral carotid artery stenosis  OBJECTIVE:   BP 130/80   Ht 5\' 10"  (1.778 m)   Wt 175 lb 12.8 oz (79.7 kg)   BMI 25.22 kg/m    PHQ-9:  Depression screen [facility] 2/9 06/15/2020 05/26/2020 04/28/2020  Decreased Interest (No Data) 0 0  Down, Depressed, Hopeless - 0 0  PHQ - 2 Score - 0 0     GAD-7: No flowsheet data found.  Physical Exam General: Awake, alert, oriented Neck: Soft, supple, nontender, thyroid normal size to palpation, no thyroid nodules appreciated Cardiovascular: Regular rate and rhythm, S1 and S2 present, no murmurs auscultated Respiratory: Crackles of inferior lung fields posteriorly, superior lung fields clear to auscultation Extremities: Trace BLE edema bilaterally of distal 1/3 calf, palpable pretibial pulses bilaterally, no edema of hands or wrists Neuro: Cranial nerves II through X grossly  intact, able to move all extremities spontaneously Psych: Pleasant, smiling, unable to recall some details, wife doing most of the answering, likely limited insight due to dementia   ASSESSMENT/PLAN:   Acute CHF (congestive heart failure) ([place]) Hospitalized January 2-4 for acute exacerbation.  Treated with IV diuresis, prednisone, doxycycline, O2.  Per discharge summary recommendation, obtained magnesium today.  Cardiologist obtained CMP, CBC, lipid panel, BNP 1/12.  Notably, BNP measured 344.8 on 1/12, increased from 266.2 on 1/02 (day of hospital admission). Cardiologist decreased torsemide on 1/12 to 40 mg once daily due to patient reported orthostatic lightheadedness. -Follow-up magnesium level -Prescribe DME wheelchair, bedside commode -Wife will contact us if she needs help connecting with new HHPT provider  Chronic kidney disease, stage 3 ([place]) Last saw Sept/Oct 2021.  Per wife, next appointment 2/7.  Normally sees annually.  Patient is chronically anemic, likely due to CKD stage III, but is notably not on any erythropoietin or iron currently.  Follow-up at next visit after nephrology appointment and update med list.  Anemia Chart indicates chronic anemia with low Hgb measurements as far back as 2017. Hgb in chart ranges 8.8-11.8.  Most recently 10.8 on 11/12.  In chart review cannot find iron panel.  Likely due to CKD stage III; per review of our charts has not been on erythropoietin before and was prescribed iron as far back as 2019, unclear if still taking.  Also no record of colonoscopy in our charts; per wife, had last done in the hospital at [facility]  regional in 2014 or 2015. Will obtain records.   Screen for colon cancer No colonoscopy record in our chart system.  Per wife, patient last colonoscopy in hospital at [facility] regional in 2014 and 2015.  Will obtain records.  Wife reports that the colonoscopy revealed diverticulosis but no polyps.  Instructed to do normal 10-year  follow-up.  Hyperthyroidism Cardiologist obtained thyroid labs 1/12; TSH 0.011, free T4 measured 2.19.  Consistent with hypothyroidism.  Of note, patient is currently on amiodarone per cardiology. TSH previously measured on low end of normal 2017-2020; however since 8/6 as measured quite low, ranging 0.011-0.060.  TSH measurement on 1/12 lowest in our records.  Wife notes his thyroid was previously evaluated with ultrasound and "nothing was found", unknown year. -Thyroid antibody lab today -Referral to endocrinology     Ezequiel Essex, MD [place]

## 2021-01-08 NOTE — Assessment & Plan Note (Signed)
Hospitalized January 2-4 for acute exacerbation.  Treated with IV diuresis, prednisone, doxycycline, O2.  Per discharge summary recommendation, obtained magnesium today.  Cardiologist obtained CMP, CBC, lipid panel, BNP 1/12.  Notably, BNP measured 344.8 on 1/12, increased from 266.2 on 1/02 (day of hospital admission). Cardiologist decreased torsemide on 1/12 to 40 mg once daily due to patient reported orthostatic lightheadedness. -Follow-up magnesium level -Prescribe DME wheelchair, bedside commode -Wife will contact us if she needs help connecting with new HHPT provider

## 2021-01-08 NOTE — Patient Instructions (Addendum)
It was wonderful to see you today. Thank you for allowing me to be a part of your care. Below is a short summary of what we discussed at your visit today:  Hospital discharge follow up We are seeing you today to follow up being discharged from the hospital for difficulty breathing. Today, we obtained some blood tests to make sure you are stable after the hospital. We will send you a letter with normal results and call you with any abnormal results. Keep up the good work meeting regularly with your specialists. We can see that you have seen your pulmonologist and cardiologist already since your hospital discharge.  Per your wife, you're due to see the nephrologist at [facility] on February 7. -Magnesium lab - Keep up the good work with your daily weights and adjust your diuretics as instructed - Ordered wheelchair and bedside commode - If you need a new physical therapy provider, let us know and we'll connect with one  High thyroid (hyperthyoidism) Recent labs suggest that you have a high thyroid, or hypothyroidism.  We will obtain another lab today to assist with diagnosing the reason why.  We will also send you to an endocrinologist for further work-up and treatment. -Thyroid antibody lab - Referral to endocrinology  Screenings  Colonoscopy: Wife reported today that you are up-to-date on your colonoscopy and that you had it in the hospital around 2014 or 2015 at [facility].  Today we will have you sign a consent form to release those records so we may put this in our system.  Hepatitis C: We obtained a blood sample to screen for Hep. C today. We recommend every adult is screened for HIV and hepatitis at least once in a lifetime.    COVID vaccines: Thank you for providing your COVID-vaccine card to [place] today.  We'll update our system to reflect this.   If you have any questions or concerns, please do not hesitate to contact us via phone or MyChart message.   Ezequiel Essex,  MD

## 2021-01-08 NOTE — Assessment & Plan Note (Signed)
Last saw Sept/Oct 2021.  Per wife, next appointment 2/7.  Normally sees annually.  Patient is chronically anemic, likely due to CKD stage III, but is notably not on any erythropoietin or iron currently.  Follow-up at next visit after nephrology appointment and update med list.

## 2021-01-08 NOTE — Assessment & Plan Note (Addendum)
Chart indicates chronic anemia with low Hgb measurements as far back as 2017. Hgb in chart ranges 8.8-11.8.  Most recently 10.8 on 11/12.  In chart review cannot find iron panel.  Likely due to CKD stage III; per review of our charts has not been on erythropoietin before and was prescribed iron as far back as 2019, unclear if still taking.  Also no record of colonoscopy in our charts; per wife, had last done in the hospital at [facility] in 2014 or 2015. Will obtain records.

## 2021-01-12 ENCOUNTER — Telehealth: Payer: Self-pay | Admitting: Family Medicine

## 2021-01-12 DIAGNOSIS — F0280 Dementia in other diseases classified elsewhere without behavioral disturbance: Secondary | ICD-10-CM

## 2021-01-12 DIAGNOSIS — J441 Chronic obstructive pulmonary disease with (acute) exacerbation: Secondary | ICD-10-CM

## 2021-01-12 DIAGNOSIS — I480 Paroxysmal atrial fibrillation: Secondary | ICD-10-CM

## 2021-01-12 DIAGNOSIS — F0150 Vascular dementia without behavioral disturbance: Secondary | ICD-10-CM

## 2021-01-12 DIAGNOSIS — I509 Heart failure, unspecified: Secondary | ICD-10-CM

## 2021-01-12 DIAGNOSIS — I2510 Atherosclerotic heart disease of native coronary artery without angina pectoris: Secondary | ICD-10-CM

## 2021-01-12 DIAGNOSIS — R0600 Dyspnea, unspecified: Secondary | ICD-10-CM

## 2021-01-12 DIAGNOSIS — R0609 Other forms of dyspnea: Secondary | ICD-10-CM

## 2021-01-12 NOTE — Telephone Encounter (Signed)
Spoke with patient's wife and caregiver, Lucas Kilbarger. I relayed the results of the thyroid antibody lab.  I told her this indicates a problem with bodies formation of antibodies against his thyroid (likely Graves' disease).   The referral for endocrinology was placed at his previous visit 1/14.  Wife also asked for referral or prescription for home health services, as she could really use help with him.  I told her I could place an order for home health services today, but that she would probably get these services faster if she were to reach out to the insurance directly and see who they contracted with here in [place].  She will contact the company and I will place the order.  Ezequiel Essex, MD

## 2021-01-13 DIAGNOSIS — I2510 Atherosclerotic heart disease of native coronary artery without angina pectoris: Secondary | ICD-10-CM | POA: Diagnosis not present

## 2021-01-13 DIAGNOSIS — Z95810 Presence of automatic (implantable) cardiac defibrillator: Secondary | ICD-10-CM | POA: Diagnosis not present

## 2021-01-13 DIAGNOSIS — I6529 Occlusion and stenosis of unspecified carotid artery: Secondary | ICD-10-CM | POA: Diagnosis not present

## 2021-01-13 DIAGNOSIS — K219 Gastro-esophageal reflux disease without esophagitis: Secondary | ICD-10-CM | POA: Diagnosis not present

## 2021-01-13 DIAGNOSIS — I252 Old myocardial infarction: Secondary | ICD-10-CM | POA: Diagnosis not present

## 2021-01-13 DIAGNOSIS — Z955 Presence of coronary angioplasty implant and graft: Secondary | ICD-10-CM | POA: Diagnosis not present

## 2021-01-13 DIAGNOSIS — G4733 Obstructive sleep apnea (adult) (pediatric): Secondary | ICD-10-CM | POA: Diagnosis not present

## 2021-01-13 DIAGNOSIS — Z9181 History of falling: Secondary | ICD-10-CM | POA: Diagnosis not present

## 2021-01-13 DIAGNOSIS — J441 Chronic obstructive pulmonary disease with (acute) exacerbation: Secondary | ICD-10-CM | POA: Diagnosis not present

## 2021-01-13 DIAGNOSIS — J9611 Chronic respiratory failure with hypoxia: Secondary | ICD-10-CM | POA: Diagnosis not present

## 2021-01-13 DIAGNOSIS — I5022 Chronic systolic (congestive) heart failure: Secondary | ICD-10-CM | POA: Diagnosis not present

## 2021-01-13 DIAGNOSIS — I255 Ischemic cardiomyopathy: Secondary | ICD-10-CM | POA: Diagnosis not present

## 2021-01-13 DIAGNOSIS — Z7901 Long term (current) use of anticoagulants: Secondary | ICD-10-CM | POA: Diagnosis not present

## 2021-01-13 DIAGNOSIS — Z950 Presence of cardiac pacemaker: Secondary | ICD-10-CM | POA: Diagnosis not present

## 2021-01-13 DIAGNOSIS — E785 Hyperlipidemia, unspecified: Secondary | ICD-10-CM | POA: Diagnosis not present

## 2021-01-13 DIAGNOSIS — F0390 Unspecified dementia without behavioral disturbance: Secondary | ICD-10-CM | POA: Diagnosis not present

## 2021-01-13 DIAGNOSIS — Z87891 Personal history of nicotine dependence: Secondary | ICD-10-CM | POA: Diagnosis not present

## 2021-01-13 DIAGNOSIS — I130 Hypertensive heart and chronic kidney disease with heart failure and stage 1 through stage 4 chronic kidney disease, or unspecified chronic kidney disease: Secondary | ICD-10-CM | POA: Diagnosis not present

## 2021-01-13 DIAGNOSIS — N1832 Chronic kidney disease, stage 3b: Secondary | ICD-10-CM | POA: Diagnosis not present

## 2021-01-13 DIAGNOSIS — I69354 Hemiplegia and hemiparesis following cerebral infarction affecting left non-dominant side: Secondary | ICD-10-CM | POA: Diagnosis not present

## 2021-01-13 DIAGNOSIS — D631 Anemia in chronic kidney disease: Secondary | ICD-10-CM | POA: Diagnosis not present

## 2021-01-13 DIAGNOSIS — Z951 Presence of aortocoronary bypass graft: Secondary | ICD-10-CM | POA: Diagnosis not present

## 2021-01-13 DIAGNOSIS — I480 Paroxysmal atrial fibrillation: Secondary | ICD-10-CM | POA: Diagnosis not present

## 2021-01-13 DIAGNOSIS — Z79899 Other long term (current) drug therapy: Secondary | ICD-10-CM | POA: Diagnosis not present

## 2021-01-13 LAB — HCV INTERPRETATION

## 2021-01-13 LAB — THYROID ANTIBODIES
Thyroglobulin Antibody: <1 IU/mL (ref 0.0–0.9)
Thyroperoxidase Ab SerPl-aCnc: 257 IU/mL — ABNORMAL HIGH (ref 0–34)

## 2021-01-13 LAB — HCV AB W REFLEX TO QUANT PCR: HCV Ab: <0.1 s/co ratio (ref 0.0–0.9)

## 2021-01-13 LAB — MAGNESIUM: Magnesium: 2.2 mg/dL (ref 1.6–2.3)

## 2021-01-13 NOTE — Telephone Encounter (Signed)
Received phone call from Santiago Glad, nurse case manager at [facility] regarding patient. Patient is requesting custodial care from [facility]. This is a new service that is offered to patient's after hospitalization. Patient can receive up to 20 hours with in-network provider.   Santiago Glad will fax over the paperwork to initiate this process.   To PCP  Talbot Grumbling, RN

## 2021-01-14 ENCOUNTER — Encounter: Payer: Self-pay | Admitting: Family Medicine

## 2021-01-14 ENCOUNTER — Other Ambulatory Visit: Payer: Self-pay | Admitting: *Deleted

## 2021-01-14 DIAGNOSIS — G4733 Obstructive sleep apnea (adult) (pediatric): Secondary | ICD-10-CM | POA: Diagnosis not present

## 2021-01-15 ENCOUNTER — Ambulatory Visit (INDEPENDENT_AMBULATORY_CARE_PROVIDER_SITE_OTHER): Payer: HMO

## 2021-01-15 DIAGNOSIS — I255 Ischemic cardiomyopathy: Secondary | ICD-10-CM | POA: Diagnosis not present

## 2021-01-15 LAB — CUP PACEART REMOTE DEVICE CHECK
Battery Remaining Longevity: 53 mo
Battery Remaining Percentage: 59 %
Battery Voltage: 2.96 V
Brady Statistic AP VP Percent: 1 %
Brady Statistic AP VS Percent: 24 %
Brady Statistic AS VP Percent: 1 %
Brady Statistic AS VS Percent: 75 %
Brady Statistic RA Percent Paced: 24 %
Brady Statistic RV Percent Paced: 1 %
Date Time Interrogation Session: 20220121040016
HighPow Impedance: 75 Ohm
HighPow Impedance: 75 Ohm
Implantable Lead Implant Date: 20140217
Implantable Lead Implant Date: 20190104
Implantable Lead Location: 753859
Implantable Lead Location: 753860
Implantable Lead Model: 7122
Implantable Pulse Generator Implant Date: 20190104
Lead Channel Impedance Value: 350 Ohm
Lead Channel Impedance Value: 580 Ohm
Lead Channel Pacing Threshold Amplitude: 1 V
Lead Channel Pacing Threshold Amplitude: 1 V
Lead Channel Pacing Threshold Pulse Width: 0.5 ms
Lead Channel Pacing Threshold Pulse Width: 0.8 ms
Lead Channel Sensing Intrinsic Amplitude: 0.9 mV
Lead Channel Sensing Intrinsic Amplitude: 12 mV
Lead Channel Setting Pacing Amplitude: 2.5 V
Lead Channel Setting Pacing Amplitude: 2.5 V
Lead Channel Setting Pacing Pulse Width: 0.5 ms
Lead Channel Setting Sensing Sensitivity: 0.5 mV
Pulse Gen Serial Number: 9777027

## 2021-01-25 DIAGNOSIS — N184 Chronic kidney disease, stage 4 (severe): Secondary | ICD-10-CM | POA: Diagnosis not present

## 2021-01-27 NOTE — Progress Notes (Signed)
Remote ICD transmission.   

## 2021-01-28 DIAGNOSIS — W010XXA Fall on same level from slipping, tripping and stumbling without subsequent striking against object, initial encounter: Secondary | ICD-10-CM | POA: Diagnosis not present

## 2021-01-28 DIAGNOSIS — S8992XA Unspecified injury of left lower leg, initial encounter: Secondary | ICD-10-CM | POA: Diagnosis not present

## 2021-01-29 DIAGNOSIS — M79662 Pain in left lower leg: Secondary | ICD-10-CM | POA: Diagnosis not present

## 2021-01-29 DIAGNOSIS — M25562 Pain in left knee: Secondary | ICD-10-CM | POA: Diagnosis not present

## 2021-02-01 DIAGNOSIS — N184 Chronic kidney disease, stage 4 (severe): Secondary | ICD-10-CM | POA: Diagnosis not present

## 2021-02-01 DIAGNOSIS — I129 Hypertensive chronic kidney disease with stage 1 through stage 4 chronic kidney disease, or unspecified chronic kidney disease: Secondary | ICD-10-CM | POA: Diagnosis not present

## 2021-02-01 DIAGNOSIS — D649 Anemia, unspecified: Secondary | ICD-10-CM | POA: Diagnosis not present

## 2021-02-03 DIAGNOSIS — I130 Hypertensive heart and chronic kidney disease with heart failure and stage 1 through stage 4 chronic kidney disease, or unspecified chronic kidney disease: Secondary | ICD-10-CM | POA: Diagnosis not present

## 2021-02-03 DIAGNOSIS — Z7901 Long term (current) use of anticoagulants: Secondary | ICD-10-CM | POA: Diagnosis not present

## 2021-02-03 DIAGNOSIS — J441 Chronic obstructive pulmonary disease with (acute) exacerbation: Secondary | ICD-10-CM | POA: Diagnosis not present

## 2021-02-03 DIAGNOSIS — Z95810 Presence of automatic (implantable) cardiac defibrillator: Secondary | ICD-10-CM | POA: Diagnosis not present

## 2021-02-03 DIAGNOSIS — Z87891 Personal history of nicotine dependence: Secondary | ICD-10-CM | POA: Diagnosis not present

## 2021-02-03 DIAGNOSIS — G4733 Obstructive sleep apnea (adult) (pediatric): Secondary | ICD-10-CM | POA: Diagnosis not present

## 2021-02-03 DIAGNOSIS — I69354 Hemiplegia and hemiparesis following cerebral infarction affecting left non-dominant side: Secondary | ICD-10-CM | POA: Diagnosis not present

## 2021-02-03 DIAGNOSIS — Z9181 History of falling: Secondary | ICD-10-CM | POA: Diagnosis not present

## 2021-02-03 DIAGNOSIS — I2510 Atherosclerotic heart disease of native coronary artery without angina pectoris: Secondary | ICD-10-CM | POA: Diagnosis not present

## 2021-02-03 DIAGNOSIS — D631 Anemia in chronic kidney disease: Secondary | ICD-10-CM | POA: Diagnosis not present

## 2021-02-03 DIAGNOSIS — Z950 Presence of cardiac pacemaker: Secondary | ICD-10-CM | POA: Diagnosis not present

## 2021-02-03 DIAGNOSIS — Z955 Presence of coronary angioplasty implant and graft: Secondary | ICD-10-CM | POA: Diagnosis not present

## 2021-02-03 DIAGNOSIS — J9611 Chronic respiratory failure with hypoxia: Secondary | ICD-10-CM | POA: Diagnosis not present

## 2021-02-03 DIAGNOSIS — K219 Gastro-esophageal reflux disease without esophagitis: Secondary | ICD-10-CM | POA: Diagnosis not present

## 2021-02-03 DIAGNOSIS — I5022 Chronic systolic (congestive) heart failure: Secondary | ICD-10-CM | POA: Diagnosis not present

## 2021-02-03 DIAGNOSIS — E785 Hyperlipidemia, unspecified: Secondary | ICD-10-CM | POA: Diagnosis not present

## 2021-02-03 DIAGNOSIS — Z79899 Other long term (current) drug therapy: Secondary | ICD-10-CM | POA: Diagnosis not present

## 2021-02-03 DIAGNOSIS — I6529 Occlusion and stenosis of unspecified carotid artery: Secondary | ICD-10-CM | POA: Diagnosis not present

## 2021-02-03 DIAGNOSIS — I480 Paroxysmal atrial fibrillation: Secondary | ICD-10-CM | POA: Diagnosis not present

## 2021-02-03 DIAGNOSIS — I255 Ischemic cardiomyopathy: Secondary | ICD-10-CM | POA: Diagnosis not present

## 2021-02-03 DIAGNOSIS — N1832 Chronic kidney disease, stage 3b: Secondary | ICD-10-CM | POA: Diagnosis not present

## 2021-02-03 DIAGNOSIS — F0390 Unspecified dementia without behavioral disturbance: Secondary | ICD-10-CM | POA: Diagnosis not present

## 2021-02-03 DIAGNOSIS — Z951 Presence of aortocoronary bypass graft: Secondary | ICD-10-CM | POA: Diagnosis not present

## 2021-02-03 DIAGNOSIS — I252 Old myocardial infarction: Secondary | ICD-10-CM | POA: Diagnosis not present

## 2021-02-04 DIAGNOSIS — N183 Chronic kidney disease, stage 3 unspecified: Secondary | ICD-10-CM | POA: Diagnosis not present

## 2021-02-04 DIAGNOSIS — I5021 Acute systolic (congestive) heart failure: Secondary | ICD-10-CM | POA: Diagnosis not present

## 2021-02-05 DIAGNOSIS — H18513 Endothelial corneal dystrophy, bilateral: Secondary | ICD-10-CM | POA: Diagnosis not present

## 2021-02-05 DIAGNOSIS — H18003 Unspecified corneal deposit, bilateral: Secondary | ICD-10-CM | POA: Diagnosis not present

## 2021-02-07 NOTE — Progress Notes (Signed)
Cardiology Office Note:    Date:  02/08/2021   ID:  Joe Wiggins, DOB 12-30-1945, MRN [id number]  PCP:  Joe Essex, MD  Cardiologist:  No primary care provider on file.  Electrophysiologist:  Joe Peru, MD   Referring MD: Joe Essex, MD   Chief Complaint  Patient presents with  . Congestive Heart Failure    History of Present Illness:    Joe Wiggins is a 75 y.o. male with a hx of ischemic cardiomyopathy status post ICD, atrial fibrillation on Eliquis and amiodarone, COPD, OSA, CVA, hypertension, hyperlipidemia, CKD stage IIIb who presents for follow-up.  He was admitted from 1/2 through 12/29/2020 after presenting with worsening shortness of breath.  He also had a fall.  Etiology of dyspnea felt to be secondary to COPD and heart failure exacerbation.  He was treated with prednisone and doxycycline and received diuresis.  Echocardiogram on 12/28/2020 showed LVEF 45 to 50%, septal hypokinesis and dyssynchrony consistent with RV pacing, concentric LVH, grade 2 diastolic dysfunction.  He reports that he is not having lightheadedness with standing.  BP 150s to 160s at home.  Denies any syncope.  Denies any chest pain but reports dyspnea with minimal exertion.  Has been monitoring weights, reports has been stable.  Does note occasional lower extremity edema.  Has been taking Eliquis, reports some epistaxis but denies any other bleeding issues  Since last clinic visit, he reports that his lightheadedness has improved since reducing his dose of torsemide.  Weight has been stable, but has had to take occasional extra dose of torsemide.  He denies any chest pain or dyspnea.  Denies any syncope or palpitations.  Reports stable lower extremity edema.  Stopped his amiodarone, sees endocrinology next month.  Reports BP has been 120s to 140s at home.    Wt Readings from Last 3 Encounters:  02/08/21 175 lb 12.8 oz (79.7 kg)  01/08/21 175 lb 12.8 oz (79.7 kg)  12/29/20 169 lb 8.5 oz (76.9 kg)     Past Medical History:  Diagnosis Date  . Abrasion of forearm, right 07/27/2019  . Acute respiratory failure ([place])   . AF (paroxysmal atrial fibrillation) ([place])   . AICD (automatic cardioverter/defibrillator) present   . CAD (coronary artery disease)   . Cardiomyopathy, ischemic   . Carotid artery occlusion   . Chronic kidney disease   . COPD (chronic obstructive pulmonary disease) ([place])   . Cough 11/13/2019  . Dementia without behavioral disturbance ([place])   . Dizziness 01/17/2016  . Facial rash 08/15/2018  . GERD (gastroesophageal reflux disease)   . Hyperlipidemia   . Hypertension   . Pre-syncope 01/16/2018  . Respiratory distress 12/27/2020  . Sleep apnea   . Stroke ([place])   . TIA (transient ischemic attack)     Past Surgical History:  Procedure Laterality Date  . CARDIAC PACEMAKER PLACEMENT    . CATARACT EXTRACTION    . CORONARY ARTERY BYPASS GRAFT    . CORONARY STENT PLACEMENT    . EYE SURGERY    . ICD IMPLANT N/A 12/29/2017   Procedure: ICD IMPLANT;  Surgeon: Joe Lance, MD;  Location: [facility];  Service: Cardiovascular;  Laterality: N/A;  . LEFT HEART CATH AND CORS/GRAFTS ANGIOGRAPHY N/A 12/29/2017   Procedure: LEFT HEART CATH AND CORS/GRAFTS ANGIOGRAPHY;  Surgeon: Wiggins, Joe M, MD;  Location: [facility];  Service: Cardiovascular;  Laterality: N/A;    Current Medications: Current Meds  Medication Sig  . albuterol (ACCUNEB) 0.63 MG/3ML  nebulizer solution Take 3 mLs by nebulization 4 (four) times daily as needed for shortness of breath or wheezing. 3 ml inhale orally via nebulizer four times a day  . albuterol (PROVENTIL HFA;VENTOLIN HFA) 108 (90 Base) MCG/ACT inhaler Inhale 2 puffs into the lungs every 6 (six) hours as needed for wheezing or shortness of breath.  Marland Kitchen apixaban (ELIQUIS) 5 MG TABS tablet Take 1 tablet (5 mg total) by mouth 2 (two) times daily.  Marland Kitchen ascorbic acid (VITAMIN C) 500 MG tablet Take 500 mg by mouth daily.  Marland Kitchen atorvastatin  (LIPITOR) 40 MG tablet Take 1 tablet (40 mg total) by mouth daily.  . cholecalciferol (VITAMIN D) 1000 units tablet Take 1,000 Units by mouth daily.  . Cyanocobalamin (VITAMIN B 12 PO) Take 1 tablet by mouth daily.  . ferrous sulfate 325 (65 FE) MG tablet Take 325 mg by mouth daily with breakfast.  . Fluticasone-Umeclidin-Vilant (TRELEGY ELLIPTA) 100-62.5-25 MCG/INH AEPB Inhale 1 puff into the lungs in the morning.  . folic acid (FOLVITE) A999333 MCG tablet Take 400 mcg by mouth daily.  Marland Kitchen labetalol (NORMODYNE) 300 MG tablet Take 1 tablet (300 mg total) by mouth 2 (two) times daily.  . Multiple Vitamin (MULTIVITAMIN) tablet Take 1 tablet by mouth daily.  . mupirocin ointment (BACTROBAN) 2 % Apply 1 application topically 2 (two) times daily. To buttocks  . nitroGLYCERIN (NITROSTAT) 0.4 MG SL tablet Place 1 tablet (0.4 mg total) under the tongue every 5 (five) minutes as needed for chest pain.  Marland Kitchen omeprazole (PRILOSEC) 40 MG capsule Take 40 mg by mouth daily as needed (indigestion/heartburn).  . torsemide (DEMADEX) 20 MG tablet Take 40 mg by mouth daily. 1 Tablet Daily     Allergies:   Ipratropium, Metoprolol, Morphine and related, Vicodin [hydrocodone-acetaminophen], and Penicillins   Social History   Socioeconomic History  . Marital status: Married    Spouse name: Not on file  . Number of children: Not on file  . Years of education: Not on file  . Highest education level: Not on file  Occupational History  . Not on file  Tobacco Use  . Smoking status: Former Smoker    Types: Cigarettes    Quit date: 11/30/2012    Years since quitting: 8.1  . Smokeless tobacco: Never Used  Vaping Use  . Vaping Use: Never used  Substance and Sexual Activity  . Alcohol use: No  . Drug use: No  . Sexual activity: Not on file  Other Topics Concern  . Not on file  Social History Narrative  . Not on file   Social Determinants of Health   Financial Resource Strain: Not on file  Food Insecurity: No Food  Insecurity  . Worried About [place] fund[place] in the Last Year: Never true  . Ran Out of Food in the Last Year: Never true  Transportation Needs: No Transportation Needs  . Lack of Transportation (Medical): No  . Lack of Transportation (Non-Medical): No  Physical Activity: Not on file  Stress: Not on file  Social Connections: Not on file     Family History: The patient's family history includes Heart disease in his mother and sister; Hypertension in his mother and sister; Thyroid disease in his brother and sister.  ROS:   Please see the history of present illness.    All other systems reviewed and are negative.  EKGs/Labs/Other Studies Reviewed:    The following studies were reviewed today:   EKG:  EKG is not ordered today.  Recent Labs: 01/06/2021: ALT 48; BNP 344.8; BUN 51; Creatinine, Ser 2.51; Hemoglobin 10.8; Platelets 174; Potassium 4.1; Sodium 136; TSH 0.011 01/08/2021: Magnesium 2.2  Recent Lipid Panel    Component Value Date/Time   CHOL 104 01/06/2021 1203   TRIG 65 01/06/2021 1203   HDL 54 01/06/2021 1203   CHOLHDL 1.9 01/06/2021 1203   CHOLHDL 4.6 12/26/2017 0845   VLDL 33 12/26/2017 0845   LDLCALC 36 01/06/2021 1203    Physical Exam:    VS:  BP (!) 150/68   Pulse 66   Ht '5\' 9"'$  (1.753 m)   Wt 175 lb 12.8 oz (79.7 kg)   SpO2 95%   BMI 25.96 kg/m     Wt Readings from Last 3 Encounters:  02/08/21 175 lb 12.8 oz (79.7 kg)  01/08/21 175 lb 12.8 oz (79.7 kg)  12/29/20 169 lb 8.5 oz (76.9 kg)     GEN:  in no acute distress HEENT: Normal NECK: No JVD CARDIAC: RRR, 2 out of 6 systolic murmur RESPIRATORY: Diminished breath sounds on left side ABDOMEN: Soft, non-tender, non-distended MUSCULOSKELETAL:  No edema SKIN: Warm and dry NEUROLOGIC:  Alert and oriented x 3 PSYCHIATRIC:  Normal affect   ASSESSMENT:    1. Chronic combined systolic and diastolic heart failure ([place])   2. Atrial fibrillation, unspecified type ([place])   3. Coronary artery  disease involving native coronary artery of native heart, unspecified whether angina present   4. Hyperthyroidism    PLAN:    Chronic combined systolic and diastolic heart failure: Status post CABG (LIMA-LAD, SVG-D1, SVG-OM, SVG-PDA as sequential graft) on 06/18/2013.  Most recent cath 12/29/2017 showed severe three-vessel occlusive CAD, patent LIMA-LAD, patent sequential SVG- OM and PDA, patent Y graft SVG-diagonal.  Echocardiogram 12/28/2020 with EF 45 to A999333, grade 2 diastolic dysfunction, septal hypokinesis and dyssynchrony consistent with RV pacing.  Status post AICD placement -Follows with the EP for ICD management -Continue torsemide 40 mg daily.  Decreased from 40 mg twice daily at last visit due to symptoms suggestive of orthostasis.  Advised to continue to monitor daily weights and call if increasing more than 3 pounds in 1 day or 5 pounds in 1 week  Atrial fibrillation: CHA2DS2-VASc score 7 (CHF, hypertension, age x2, CVA x2, CAD) -Continue Eliquis -Continue labetalol -Discontinued amiodarone due to hyperthyroidism -Follows with EP  CAD: Status post CABG as above.  Continue Eliquis and statin.  Hypertension: On labetalol 300 mg twice daily.  Mildly elevated in clinic today, but reports normotensive at home.  Hesitant to add additional antihypertensive given symptoms suggestive of orthostatic hypotension.  Will continue to monitor.  CKD stage IIIb: Baseline creatinine around 2.2, was up to 3.0 on admission on 1/2, was 2.6 on discharge.  Follows with nephrology  Hyperlipidemia: Continue atorvastatin 40 mg daily.  LDL 36 on 01/06/2021  Hyperthyroidism: Labs on 01/06/2021 showed TSH 0.011, free T4 2.19, consistent with hyperthyroidism.  Discussed with Dr. Lovena Le, discontinued amiodarone.  Referred to endocrinology.  OSA: On CPAP  RTC in 6 months   Medication Adjustments/Labs and Tests Ordered: Current medicines are reviewed at length with the patient today.  Concerns regarding  medicines are outlined above.  No orders of the defined types were placed in this encounter.  No orders of the defined types were placed in this encounter.   Patient Instructions  Medication Instructions:  Your physician recommends that you continue on your current medications as directed. Please refer to the Current Medication list given to you today.  *  If you need a refill on your cardiac medications before your next appointment, please call your pharmacy*  Follow-Up: At [facility], you and your health needs are our priority.  As part of our continuing mission to provide you with exceptional heart care, we have created designated Provider Care Teams.  These Care Teams include your primary Cardiologist (physician) and Advanced Practice Providers (APPs -  Physician Assistants and Nurse Practitioners) who all work together to provide you with the care you need, when you need it.  We recommend signing up for the patient portal called "MyChart".  Sign up information is provided on this After Visit Summary.  MyChart is used to connect with patients for Virtual Visits (Telemedicine).  Patients are able to view lab/test results, encounter notes, upcoming appointments, etc.  Non-urgent messages can be sent to your provider as well.   To learn more about what you can do with MyChart, go to [email or web address].    Your next appointment:   6 month(s)  The format for your next appointment:   In Person  Provider:   Oswaldo Milian, MD       Signed, Donato Heinz, MD  02/08/2021 1:49 PM    [place]

## 2021-02-08 ENCOUNTER — Ambulatory Visit (INDEPENDENT_AMBULATORY_CARE_PROVIDER_SITE_OTHER): Payer: HMO | Admitting: Cardiology

## 2021-02-08 ENCOUNTER — Other Ambulatory Visit: Payer: Self-pay

## 2021-02-08 ENCOUNTER — Encounter: Payer: Self-pay | Admitting: Cardiology

## 2021-02-08 VITALS — BP 150/68 | HR 66 | Ht 69.0 in | Wt 175.8 lb

## 2021-02-08 DIAGNOSIS — I2510 Atherosclerotic heart disease of native coronary artery without angina pectoris: Secondary | ICD-10-CM | POA: Diagnosis not present

## 2021-02-08 DIAGNOSIS — E0590 Thyrotoxicosis, unspecified without thyrotoxic crisis or storm: Secondary | ICD-10-CM | POA: Diagnosis not present

## 2021-02-08 DIAGNOSIS — I5042 Chronic combined systolic (congestive) and diastolic (congestive) heart failure: Secondary | ICD-10-CM | POA: Diagnosis not present

## 2021-02-08 DIAGNOSIS — I4891 Unspecified atrial fibrillation: Secondary | ICD-10-CM

## 2021-02-08 NOTE — Patient Instructions (Signed)

## 2021-02-16 ENCOUNTER — Other Ambulatory Visit: Payer: Self-pay

## 2021-02-16 ENCOUNTER — Ambulatory Visit (INDEPENDENT_AMBULATORY_CARE_PROVIDER_SITE_OTHER): Payer: HMO | Admitting: Internal Medicine

## 2021-02-16 ENCOUNTER — Encounter: Payer: Self-pay | Admitting: Internal Medicine

## 2021-02-16 VITALS — BP 174/68 | HR 66 | Ht 69.0 in | Wt 173.6 lb

## 2021-02-16 DIAGNOSIS — Z95810 Presence of automatic (implantable) cardiac defibrillator: Secondary | ICD-10-CM | POA: Diagnosis not present

## 2021-02-16 DIAGNOSIS — I480 Paroxysmal atrial fibrillation: Secondary | ICD-10-CM | POA: Diagnosis not present

## 2021-02-16 DIAGNOSIS — I495 Sick sinus syndrome: Secondary | ICD-10-CM | POA: Diagnosis not present

## 2021-02-16 DIAGNOSIS — I5022 Chronic systolic (congestive) heart failure: Secondary | ICD-10-CM

## 2021-02-16 DIAGNOSIS — I425 Other restrictive cardiomyopathy: Secondary | ICD-10-CM | POA: Diagnosis not present

## 2021-02-16 NOTE — Progress Notes (Signed)
HPI Mr. Barrella returns today for followup. He underwent upgrade to a DDD ICD from a DDD PM3 years ago. He feels well except for some dizziness. No chest pain or sob. No edema. He has easy bruisability in his arms. He is on systemic anti-coagulation for atrial fib. He has sinus node dysfunction. He had an episode of syncope which was due to VF. He was successfully defibrillated. Allergies  Allergen Reactions   Ipratropium Other (See Comments)   Metoprolol     Was bringing blood pressure too low and he would pass out.   Morphine And Related Nausea Only   Vicodin [Hydrocodone-Acetaminophen] Other (See Comments)    Hallucinations    Penicillins Rash     Current Outpatient Medications  Medication Sig Dispense Refill   albuterol (ACCUNEB) 0.63 MG/3ML nebulizer solution Take 3 mLs by nebulization 4 (four) times daily as needed for shortness of breath or wheezing. 3 ml inhale orally via nebulizer four times a day     albuterol (PROVENTIL HFA;VENTOLIN HFA) 108 (90 Base) MCG/ACT inhaler Inhale 2 puffs into the lungs every 6 (six) hours as needed for wheezing or shortness of breath.     apixaban (ELIQUIS) 5 MG TABS tablet Take 1 tablet (5 mg total) by mouth 2 (two) times daily. 180 tablet 2   ascorbic acid (VITAMIN C) 500 MG tablet Take 500 mg by mouth daily.     atorvastatin (LIPITOR) 40 MG tablet Take 1 tablet (40 mg total) by mouth daily. 90 tablet 3   cholecalciferol (VITAMIN D) 1000 units tablet Take 1,000 Units by mouth daily.     Cyanocobalamin (VITAMIN B 12 PO) Take 1 tablet by mouth daily.     ferrous sulfate 325 (65 FE) MG tablet Take 325 mg by mouth daily with breakfast.     Fluticasone-Umeclidin-Vilant (TRELEGY ELLIPTA) 100-62.5-25 MCG/INH AEPB Inhale 1 puff into the lungs in the morning.     folic acid (FOLVITE) A999333 MCG tablet Take 400 mcg by mouth daily.     labetalol (NORMODYNE) 300 MG tablet Take 1 tablet (300 mg total) by mouth 2 (two) times daily. 180  tablet 3   Multiple Vitamin (MULTIVITAMIN) tablet Take 1 tablet by mouth daily.     mupirocin ointment (BACTROBAN) 2 % Apply 1 application topically 2 (two) times daily. To buttocks     nitroGLYCERIN (NITROSTAT) 0.4 MG SL tablet Place 1 tablet (0.4 mg total) under the tongue every 5 (five) minutes as needed for chest pain. 75 tablet 2   omeprazole (PRILOSEC) 40 MG capsule Take 40 mg by mouth daily as needed (indigestion/heartburn).     torsemide (DEMADEX) 20 MG tablet Take 40 mg by mouth daily. 1 Tablet Daily     No current facility-administered medications for this visit.     Past Medical History:  Diagnosis Date   Abrasion of forearm, right 07/27/2019   Acute respiratory failure ([facility])    AF (paroxysmal atrial fibrillation) ([facility])    AICD (automatic cardioverter/defibrillator) present    CAD (coronary artery disease)    Cardiomyopathy, ischemic    Carotid artery occlusion    Chronic kidney disease    COPD (chronic obstructive pulmonary disease) ([place])    Cough 11/13/2019   Dementia without behavioral disturbance ([facility])    Dizziness 01/17/2016   Facial rash 08/15/2018   GERD (gastroesophageal reflux disease)    Hyperlipidemia    Hypertension    Pre-syncope 01/16/2018   Respiratory distress 12/27/2020   Sleep apnea  Stroke [facility])    TIA (transient ischemic attack)     ROS:   All systems reviewed and negative except as noted in the HPI.   Past Surgical History:  Procedure Laterality Date   CARDIAC PACEMAKER PLACEMENT     CATARACT EXTRACTION     CORONARY ARTERY BYPASS GRAFT     CORONARY STENT PLACEMENT     EYE SURGERY     ICD IMPLANT N/A 12/29/2017   Procedure: ICD IMPLANT;  Surgeon: Evans Lance, MD;  Location: [facility];  Service: Cardiovascular;  Laterality: N/A;   LEFT HEART CATH AND CORS/GRAFTS ANGIOGRAPHY N/A 12/29/2017   Procedure: LEFT HEART CATH AND CORS/GRAFTS ANGIOGRAPHY;  Surgeon: Martinique, Peter M, MD;  Location: [facility];  Service: Cardiovascular;  Laterality: N/A;     Family History  Problem Relation Age of Onset   Hypertension Mother    Heart disease Mother    Hypertension Sister    Heart disease Sister    Thyroid disease Sister    Thyroid disease Brother        hyperthyroid     Social History   Socioeconomic History   Marital status: Married    Spouse name: Not on file   Number of children: Not on file   Years of education: Not on file   Highest education level: Not on file  Occupational History   Not on file  Tobacco Use   Smoking status: Former Smoker    Types: Cigarettes    Quit date: 11/30/2012    Years since quitting: 8.2   Smokeless tobacco: Never Used  Vaping Use   Vaping Use: Never used  Substance and Sexual Activity   Alcohol use: No   Drug use: No   Sexual activity: Not on file  Other Topics Concern   Not on file  Social History Narrative   Not on file   Social Determinants of Health   Financial Resource Strain: Not on file  Food Insecurity: No Food Insecurity   Worried About [place] in the Last Year: Never true   [place] in the Last Year: Never true  Transportation Needs: No Transportation Needs   Lack of Transportation (Medical): No   Lack of Transportation (Non-Medical): No  Physical Activity: Not on file  Stress: Not on file  Social Connections: Not on file  Intimate Partner Violence: Not on file     BP (!) 174/68    Pulse 66    Ht '5\' 9"'$  (1.753 m)    Wt 173 lb 9.6 oz (78.7 kg)    SpO2 94%    BMI 25.64 kg/m   Physical Exam:  Well appearing NAD HEENT: Unremarkable Neck:  No JVD, no thyromegally Lymphatics:  No adenopathy Back:  No CVA tenderness Lungs:  Clear with no wheezes HEART:  Regular rate rhythm, no murmurs, no rubs, no clicks Abd:  soft, positive bowel sounds, no organomegally, no rebound, no guarding Ext:  2 plus pulses, no edema, no cyanosis, no clubbing Skin:  No rashes no  nodules Neuro:  CN II through XII intact, motor grossly intact  DEVICE  Normal device function.  See PaceArt for details.   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 He felt poorly on it. If he were to have an increase in ventricular arrhythmias we would consider sotalol or quinidine. 2. PAF -he is maintaining NSR though I expect  he will not stay in rhythm forever. 3. Coags - he has had no bleeding on eliquis. 4. ICD - his [employer] DDD ICD is working normally. We will recheck in several months.  Carleene Overlie Jeramia Saleeby,MD

## 2021-02-16 NOTE — Patient Instructions (Signed)
Medication Instructions:  Your physician recommends that you continue on your current medications as directed. Please refer to the Current Medication list given to you today.  Labwork: None ordered.  Testing/Procedures: None ordered.  Follow-Up: Your physician wants you to follow-up in: one year with Cristopher Peru, MD or one of the following Advanced Practice Providers on your designated Care Team:    Chanetta Marshall, NP  Tommye Standard, PA-C  Legrand Como "Jonni Sanger" [place], [place]  Remote monitoring is used to monitor your ICD from home. This monitoring reduces the number of office visits required to check your device to one time per year. It allows [place] to keep an eye on the functioning of your device to ensure it is working properly. You are scheduled for a device check from home on 04/16/2021. You may send your transmission at any time that day. If you have a wireless device, the transmission will be sent automatically. After your physician reviews your transmission, you will receive a postcard with your next transmission date.  Any Other Special Instructions Will Be Listed Below (If Applicable).  If you need a refill on your cardiac medications before your next appointment, please call your pharmacy.

## 2021-02-25 ENCOUNTER — Ambulatory Visit (INDEPENDENT_AMBULATORY_CARE_PROVIDER_SITE_OTHER): Payer: HMO | Admitting: Endocrinology

## 2021-02-25 ENCOUNTER — Other Ambulatory Visit: Payer: Self-pay

## 2021-02-25 VITALS — BP 162/60 | HR 75 | Ht 69.0 in | Wt 176.6 lb

## 2021-02-25 DIAGNOSIS — E0590 Thyrotoxicosis, unspecified without thyrotoxic crisis or storm: Secondary | ICD-10-CM | POA: Diagnosis not present

## 2021-02-25 LAB — TSH: TSH: 0.16 u[IU]/mL — ABNORMAL LOW (ref 0.35–4.50)

## 2021-02-25 LAB — T4, FREE: Free T4: 0.93 ng/dL (ref 0.60–1.60)

## 2021-02-25 NOTE — Progress Notes (Signed)
Subjective:    Patient ID: Joe Wiggins, male    DOB: 11/16/1946, 75 y.o.   MRN: [id number]  HPI Pt is referred by Dr Gwendlyn Deutscher, for hyperthyroidism.  Pt was incidentally noted to have a goiter on 2019 carotid US.  he was then dx'ed with hyperthyroidism in 2021.  He has never been on therapy for this.  He has never had XRT to the anterior neck, or thyroid surgery.   He does not consume kelp or any other non-prescribed thyroid medication.  He reports palpitations, tremor, and doe.   Past Medical History:  Diagnosis Date  . Abrasion of forearm, right 07/27/2019  . Acute respiratory failure ([place])   . AF (paroxysmal atrial fibrillation) ([place])   . AICD (automatic cardioverter/defibrillator) present   . CAD (coronary artery disease)   . Cardiomyopathy, ischemic   . Carotid artery occlusion   . Chronic kidney disease   . COPD (chronic obstructive pulmonary disease) ([place])   . Cough 11/13/2019  . Dementia without behavioral disturbance ([place])   . Dizziness 01/17/2016  . Facial rash 08/15/2018  . GERD (gastroesophageal reflux disease)   . Hyperlipidemia   . Hypertension   . Pre-syncope 01/16/2018  . Respiratory distress 12/27/2020  . Sleep apnea   . Stroke ([place])   . TIA (transient ischemic attack)     Past Surgical History:  Procedure Laterality Date  . CARDIAC PACEMAKER PLACEMENT    . CATARACT EXTRACTION    . CORONARY ARTERY BYPASS GRAFT    . CORONARY STENT PLACEMENT    . EYE SURGERY    . ICD IMPLANT N/A 12/29/2017   Procedure: ICD IMPLANT;  Surgeon: Evans Lance, MD;  Location: [facility];  Service: Cardiovascular;  Laterality: N/A;  . LEFT HEART CATH AND CORS/GRAFTS ANGIOGRAPHY N/A 12/29/2017   Procedure: LEFT HEART CATH AND CORS/GRAFTS ANGIOGRAPHY;  Surgeon: Martinique, Peter M, MD;  Location: [facility];  Service: Cardiovascular;  Laterality: N/A;    Social History   Socioeconomic History  . Marital status: Married    Spouse name: Not on file  . Number of children: Not on  file  . Years of education: Not on file  . Highest education level: Not on file  Occupational History  . Not on file  Tobacco Use  . Smoking status: Former Smoker    Types: Cigarettes    Quit date: 11/30/2012    Years since quitting: 8.2  . Smokeless tobacco: Never Used  Vaping Use  . Vaping Use: Never used  Substance and Sexual Activity  . Alcohol use: No  . Drug use: No  . Sexual activity: Not on file  Other Topics Concern  . Not on file  Social History Narrative  . Not on file   Social Determinants of Health   Financial Resource Strain: Not on file  Food Insecurity: No Food Insecurity  . Worried About Charity fundraiser in the Last Year: Never true  . Ran Out of Food in the Last Year: Never true  Transportation Needs: No Transportation Needs  . Lack of Transportation (Medical): No  . Lack of Transportation (Non-Medical): No  Physical Activity: Not on file  Stress: Not on file  Social Connections: Not on file  Intimate Partner Violence: Not on file    Current Outpatient Medications on File Prior to Visit  Medication Sig Dispense Refill  . albuterol (ACCUNEB) 0.63 MG/3ML nebulizer solution Take 3 mLs by nebulization 4 (four) times daily as needed for  shortness of breath or wheezing. 3 ml inhale orally via nebulizer four times a day    . albuterol (PROVENTIL HFA;VENTOLIN HFA) 108 (90 Base) MCG/ACT inhaler Inhale 2 puffs into the lungs every 6 (six) hours as needed for wheezing or shortness of breath.    Marland Kitchen apixaban (ELIQUIS) 5 MG TABS tablet Take 1 tablet (5 mg total) by mouth 2 (two) times daily. 180 tablet 2  . ascorbic acid (VITAMIN C) 500 MG tablet Take 500 mg by mouth daily.    Marland Kitchen atorvastatin (LIPITOR) 40 MG tablet Take 1 tablet (40 mg total) by mouth daily. 90 tablet 3  . cholecalciferol (VITAMIN D) 1000 units tablet Take 1,000 Units by mouth daily.    . Cyanocobalamin (VITAMIN B 12 PO) Take 1 tablet by mouth daily.    . ferrous sulfate 325 (65 FE) MG tablet Take  325 mg by mouth daily with breakfast.    . Fluticasone-Umeclidin-Vilant (TRELEGY ELLIPTA) 100-62.5-25 MCG/INH AEPB Inhale 1 puff into the lungs in the morning.    . folic acid (FOLVITE) A999333 MCG tablet Take 400 mcg by mouth daily.    Marland Kitchen labetalol (NORMODYNE) 300 MG tablet Take 1 tablet (300 mg total) by mouth 2 (two) times daily. 180 tablet 3  . Multiple Vitamin (MULTIVITAMIN) tablet Take 1 tablet by mouth daily.    . mupirocin ointment (BACTROBAN) 2 % Apply 1 application topically 2 (two) times daily. To buttocks    . nitroGLYCERIN (NITROSTAT) 0.4 MG SL tablet Place 1 tablet (0.4 mg total) under the tongue every 5 (five) minutes as needed for chest pain. 75 tablet 2  . omeprazole (PRILOSEC) 40 MG capsule Take 40 mg by mouth daily as needed (indigestion/heartburn).    . torsemide (DEMADEX) 20 MG tablet Take 40 mg by mouth daily. 1 Tablet Daily     No current facility-administered medications on file prior to visit.    Allergies  Allergen Reactions  . Ipratropium Other (See Comments)  . Metoprolol     Was bringing blood pressure too low and he would pass out.  . Morphine And Related Nausea Only  . Vicodin [Hydrocodone-Acetaminophen] Other (See Comments)    Hallucinations   . Penicillins Rash    Family History  Problem Relation Age of Onset  . Hypertension Mother   . Heart disease Mother   . Hypertension Sister   . Heart disease Sister   . Thyroid disease Sister   . Thyroid disease Brother        hyperthyroid    BP (!) 162/60 (BP Location: Right Arm, Patient Position: Sitting, Cuff Size: Normal)   Pulse 75   Ht '5\' 9"'$  (1.753 m)   Wt 176 lb 9.6 oz (80.1 kg)   SpO2 93%   BMI 26.08 kg/m     Review of Systems denies weight loss, excessive diaphoresis, anxiety, and heat intolerance.       Objective:   Physical Exam VS: see vs page GEN: no distress HEAD: head: no deformity eyes: no periorbital swelling, no proptosis external nose and ears are normal NECK: thyroid is  slightly and diffusely enlarged CHEST WALL: no deformity LUNGS: clear to auscultation.  CV: reg rate and rhythm; syst murmur is noted.   MUSCULOSKELETAL: gait is normal and steady EXTEMITIES: no deformity.  1+ bilat leg edema.   NEURO:  readily moves all 4's.  sensation is intact to touch on all 4's.  Slight tremor of the hands.  SKIN:  Normal texture and temperature.  No rash  or suspicious lesion is visible.  Not diaphoretic.  Ecchymoses of the hands.  NODES:  None palpable at the neck.   PSYCH: alert, well-oriented.  Does not appear anxious nor depressed.     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 No discrete nodules.  Lab Results  Component Value Date   TSH 0.16 (L) 02/25/2021   I have reviewed outside records, and summarized: Pt was noted to have low TSH, and referred here.  Amiodarone was stopped, due to hyperthyroidism  Lab Results  Component Value Date   TSH 0.16 (L) 02/25/2021       Assessment & Plan:  Hyperthyroidism, improved off amiodarone.  Pt is advised no medication is needed for now, as this may normalize with time.  Patient Instructions  Blood tests are requested for you today.  We'll let you know about the results.  If the thyroid is still overactive, I will prescribe for you a pill to slow it down.   As the amiodarone leaves your body, your thyroid may return to normal on its own.   Please come back for a follow-up appointment in 1 month.

## 2021-02-25 NOTE — Patient Instructions (Addendum)
Blood tests are requested for you today.  We'll let you know about the results.  If the thyroid is still overactive, I will prescribe for you a pill to slow it down.   As the amiodarone leaves your body, your thyroid may return to normal on its own.   Please come back for a follow-up appointment in 1 month.

## 2021-03-04 DIAGNOSIS — I5021 Acute systolic (congestive) heart failure: Secondary | ICD-10-CM | POA: Diagnosis not present

## 2021-03-04 DIAGNOSIS — N183 Chronic kidney disease, stage 3 unspecified: Secondary | ICD-10-CM | POA: Diagnosis not present

## 2021-03-10 LAB — CUP PACEART INCLINIC DEVICE CHECK
Battery Remaining Longevity: 56 mo
Brady Statistic RA Percent Paced: 25 %
Brady Statistic RV Percent Paced: 0.1 %
Date Time Interrogation Session: 20220222153000
HighPow Impedance: 74.25 Ohm
Implantable Lead Implant Date: 20140217
Implantable Lead Implant Date: 20190104
Implantable Lead Location: 753859
Implantable Lead Location: 753860
Implantable Lead Model: 7122
Implantable Pulse Generator Implant Date: 20190104
Lead Channel Impedance Value: 350 Ohm
Lead Channel Impedance Value: 575 Ohm
Lead Channel Pacing Threshold Amplitude: 1 V
Lead Channel Pacing Threshold Amplitude: 1.25 V
Lead Channel Pacing Threshold Pulse Width: 0.5 ms
Lead Channel Pacing Threshold Pulse Width: 0.8 ms
Lead Channel Sensing Intrinsic Amplitude: 0.7 mV
Lead Channel Sensing Intrinsic Amplitude: 12 mV
Lead Channel Setting Pacing Amplitude: 2.5 V
Lead Channel Setting Pacing Amplitude: 2.5 V
Lead Channel Setting Pacing Pulse Width: 0.5 ms
Lead Channel Setting Sensing Sensitivity: 0.5 mV
Pulse Gen Serial Number: 9777027

## 2021-03-31 DIAGNOSIS — Z961 Presence of intraocular lens: Secondary | ICD-10-CM | POA: Diagnosis not present

## 2021-03-31 DIAGNOSIS — H18513 Endothelial corneal dystrophy, bilateral: Secondary | ICD-10-CM | POA: Diagnosis not present

## 2021-03-31 DIAGNOSIS — H18003 Unspecified corneal deposit, bilateral: Secondary | ICD-10-CM | POA: Diagnosis not present

## 2021-04-01 ENCOUNTER — Telehealth: Payer: Self-pay | Admitting: Cardiology

## 2021-04-01 DIAGNOSIS — Z79899 Other long term (current) drug therapy: Secondary | ICD-10-CM

## 2021-04-01 DIAGNOSIS — I5042 Chronic combined systolic (congestive) and diastolic (congestive) heart failure: Secondary | ICD-10-CM

## 2021-04-01 NOTE — Telephone Encounter (Signed)
Spoke to wife . She states patient has gain wait since Monday to today. Wife states weight on Monday was 170 lb, Tuesday  172 lb, Wednesday 170 lb, Today 175 lb.    Blood pressure today is 150/62 heart rate is in the 60's.  Patient states no swelling , no issue with breathing at present time. Patient is able to sleep with out difficulty he still awakens to get up and go to the bathroom.   Patient takes Torsemide 20 mg daily .  Wife is aware will defer to Dr Gardiner Rhyme

## 2021-04-01 NOTE — Telephone Encounter (Signed)
Pt c/o swelling: STAT is pt has developed SOB within 24 hours  1) How much weight have you gained and in what time span? 5 lbs in 2 days  2) If swelling, where is the swelling located? no  3) Are you currently taking a fluid pill? yes  4) Are you currently SOB? no  5) Do you have a log of your daily weights (if so, list)? no  6) Have you gained 3 pounds in a day or 5 pounds in a week? yes  7) Have you traveled recently? no  [name] called in to see what needs to be done .

## 2021-04-02 NOTE — Telephone Encounter (Signed)
Is he taking torsemide 20 mg daily or 40 mg daily?  I thought his dose was 40 mg.  If he is taking 40 mg, would recommend increasing to 40 mg BID x 3 days.  If he is on 20 mg daily, would increase to 40 mg daily x 3 days.  Call with update on weights next week.  Check BMET, mag, BNP in 1 week.

## 2021-04-02 NOTE — Telephone Encounter (Signed)
Spoke to daughter-she states weight is still 175 this AM.  She has been giving extra 20 of torsemide in the evening x 1 week.   No symptoms.     Discussed with Dr. Sharolyn Douglas with 40 BID x 3 days, continue to monitor weights. Repeat labs in 1 week.   Update weight on Monday.   Daughter aware and verbalized understanding.  Will call to get updated weight on Monday.

## 2021-04-04 DIAGNOSIS — I5021 Acute systolic (congestive) heart failure: Secondary | ICD-10-CM | POA: Diagnosis not present

## 2021-04-04 DIAGNOSIS — N183 Chronic kidney disease, stage 3 unspecified: Secondary | ICD-10-CM | POA: Diagnosis not present

## 2021-04-05 ENCOUNTER — Other Ambulatory Visit: Payer: Self-pay

## 2021-04-05 ENCOUNTER — Other Ambulatory Visit: Payer: HMO | Admitting: *Deleted

## 2021-04-05 DIAGNOSIS — I5042 Chronic combined systolic (congestive) and diastolic (congestive) heart failure: Secondary | ICD-10-CM | POA: Diagnosis not present

## 2021-04-05 DIAGNOSIS — Z79899 Other long term (current) drug therapy: Secondary | ICD-10-CM

## 2021-04-06 LAB — BASIC METABOLIC PANEL
BUN/Creatinine Ratio: 8 — ABNORMAL LOW (ref 10–24)
BUN: 17 mg/dL (ref 8–27)
CO2: 24 mmol/L (ref 20–29)
Calcium: 9.8 mg/dL (ref 8.6–10.2)
Chloride: 100 mmol/L (ref 96–106)
Creatinine, Ser: 2.1 mg/dL — ABNORMAL HIGH (ref 0.76–1.27)
Glucose: 92 mg/dL (ref 65–99)
Potassium: 4.3 mmol/L (ref 3.5–5.2)
Sodium: 139 mmol/L (ref 134–144)
eGFR: 32 mL/min/{1.73_m2} — ABNORMAL LOW (ref >59)

## 2021-04-06 LAB — MAGNESIUM: Magnesium: 1.8 mg/dL (ref 1.6–2.3)

## 2021-04-06 LAB — BRAIN NATRIURETIC PEPTIDE: BNP: 508.5 pg/mL — ABNORMAL HIGH (ref 0.0–100.0)

## 2021-04-07 NOTE — Telephone Encounter (Signed)
Wife aware to continue to monitor weights.

## 2021-04-07 NOTE — Telephone Encounter (Signed)
Spoke to wife-she states patients weight continues to fluctuate between 172-175.   He has no symptoms.  She did not notice a difference with the increased torsemide.   Recent labs completed, to be reviewed by MD.  Advised to continue to monitor and will call with any additional recommendations by Dr. Gardiner Rhyme.

## 2021-04-07 NOTE — Telephone Encounter (Signed)
Labs look OK, would continue to monitor daily weights

## 2021-04-08 ENCOUNTER — Other Ambulatory Visit: Payer: Self-pay

## 2021-04-08 ENCOUNTER — Ambulatory Visit (INDEPENDENT_AMBULATORY_CARE_PROVIDER_SITE_OTHER): Payer: HMO | Admitting: Endocrinology

## 2021-04-08 VITALS — BP 144/60 | HR 77 | Ht 69.0 in | Wt 179.8 lb

## 2021-04-08 DIAGNOSIS — E0590 Thyrotoxicosis, unspecified without thyrotoxic crisis or storm: Secondary | ICD-10-CM | POA: Diagnosis not present

## 2021-04-08 LAB — T4, FREE: Free T4: 0.99 ng/dL (ref 0.60–1.60)

## 2021-04-08 LAB — TSH: TSH: 0.29 u[IU]/mL — ABNORMAL LOW (ref 0.35–4.50)

## 2021-04-08 NOTE — Progress Notes (Signed)
Subjective:    Patient ID: Joe Wiggins, male    DOB: 08-Jun-1946, 75 y.o.   MRN: [id number]  HPI Pt returns for f/u of hyperthyroidism (pt was incidentally noted to have a goiter on 2019 carotid US; dedicated US showed small diffuse goiter; he was then dx'ed with hyperthyroidism in 2021, when pt was on amiodarone, which was then stopped; he has never been on thyroid medication).  pt states he feels well in general.  Specifically, he denies palpitations and tremor.   Past Medical History:  Diagnosis Date  . Abrasion of forearm, right 07/27/2019  . Acute respiratory failure ([place])   . AF (paroxysmal atrial fibrillation) ([place])   . AICD (automatic cardioverter/defibrillator) present   . CAD (coronary artery disease)   . Cardiomyopathy, ischemic   . Carotid artery occlusion   . Chronic kidney disease   . COPD (chronic obstructive pulmonary disease) ([place])   . Cough 11/13/2019  . Dementia without behavioral disturbance ([place])   . Dizziness 01/17/2016  . Facial rash 08/15/2018  . GERD (gastroesophageal reflux disease)   . Hyperlipidemia   . Hypertension   . Pre-syncope 01/16/2018  . Respiratory distress 12/27/2020  . Sleep apnea   . Stroke ([place])   . TIA (transient ischemic attack)     Past Surgical History:  Procedure Laterality Date  . CARDIAC PACEMAKER PLACEMENT    . CATARACT EXTRACTION    . CORONARY ARTERY BYPASS GRAFT    . CORONARY STENT PLACEMENT    . EYE SURGERY    . ICD IMPLANT N/A 12/29/2017   Procedure: ICD IMPLANT;  Surgeon: Evans Lance, MD;  Location: [facility];  Service: Cardiovascular;  Laterality: N/A;  . LEFT HEART CATH AND CORS/GRAFTS ANGIOGRAPHY N/A 12/29/2017   Procedure: LEFT HEART CATH AND CORS/GRAFTS ANGIOGRAPHY;  Surgeon: Martinique, Peter M, MD;  Location: [facility];  Service: Cardiovascular;  Laterality: N/A;    Social History   Socioeconomic History  . Marital status: Married    Spouse name: Not on file  . Number of children: Not on file  .  Years of education: Not on file  . Highest education level: Not on file  Occupational History  . Not on file  Tobacco Use  . Smoking status: Former Smoker    Types: Cigarettes    Quit date: 11/30/2012    Years since quitting: 8.3  . Smokeless tobacco: Never Used  Vaping Use  . Vaping Use: Never used  Substance and Sexual Activity  . Alcohol use: No  . Drug use: No  . Sexual activity: Not on file  Other Topics Concern  . Not on file  Social History Narrative  . Not on file   Social Determinants of Health   Financial Resource Strain: Not on file  Food Insecurity: No Food Insecurity  . Worried About Charity fundraiser in the Last Year: Never true  . Ran Out of Food in the Last Year: Never true  Transportation Needs: No Transportation Needs  . Lack of Transportation (Medical): No  . Lack of Transportation (Non-Medical): No  Physical Activity: Not on file  Stress: Not on file  Social Connections: Not on file  Intimate Partner Violence: Not on file    Current Outpatient Medications on File Prior to Visit  Medication Sig Dispense Refill  . albuterol (ACCUNEB) 0.63 MG/3ML nebulizer solution Take 3 mLs by nebulization 4 (four) times daily as needed for shortness of breath or wheezing. 3 ml inhale orally  via nebulizer four times a day    . albuterol (PROVENTIL HFA;VENTOLIN HFA) 108 (90 Base) MCG/ACT inhaler Inhale 2 puffs into the lungs every 6 (six) hours as needed for wheezing or shortness of breath.    Marland Kitchen apixaban (ELIQUIS) 5 MG TABS tablet Take 1 tablet (5 mg total) by mouth 2 (two) times daily. 180 tablet 2  . ascorbic acid (VITAMIN C) 500 MG tablet Take 500 mg by mouth daily.    Marland Kitchen atorvastatin (LIPITOR) 40 MG tablet Take 1 tablet (40 mg total) by mouth daily. 90 tablet 3  . cholecalciferol (VITAMIN D) 1000 units tablet Take 1,000 Units by mouth daily.    . Cyanocobalamin (VITAMIN B 12 PO) Take 1 tablet by mouth daily.    . ferrous sulfate 325 (65 FE) MG tablet Take 325 mg by  mouth daily with breakfast.    . Fluticasone-Umeclidin-Vilant (TRELEGY ELLIPTA) 100-62.5-25 MCG/INH AEPB Inhale 1 puff into the lungs in the morning.    . folic acid (FOLVITE) A999333 MCG tablet Take 400 mcg by mouth daily.    Marland Kitchen labetalol (NORMODYNE) 300 MG tablet Take 1 tablet (300 mg total) by mouth 2 (two) times daily. 180 tablet 3  . Multiple Vitamin (MULTIVITAMIN) tablet Take 1 tablet by mouth daily.    . mupirocin ointment (BACTROBAN) 2 % Apply 1 application topically 2 (two) times daily. To buttocks    . nitroGLYCERIN (NITROSTAT) 0.4 MG SL tablet Place 1 tablet (0.4 mg total) under the tongue every 5 (five) minutes as needed for chest pain. 75 tablet 2  . omeprazole (PRILOSEC) 40 MG capsule Take 40 mg by mouth daily as needed (indigestion/heartburn).    . torsemide (DEMADEX) 20 MG tablet Take 40 mg by mouth daily. 1 Tablet Daily     No current facility-administered medications on file prior to visit.    Allergies  Allergen Reactions  . Ipratropium Other (See Comments)  . Metoprolol     Was bringing blood pressure too low and he would pass out.  . Morphine And Related Nausea Only  . Vicodin [Hydrocodone-Acetaminophen] Other (See Comments)    Hallucinations   . Penicillins Rash    Family History  Problem Relation Age of Onset  . Hypertension Mother   . Heart disease Mother   . Hypertension Sister   . Heart disease Sister   . Thyroid disease Sister   . Thyroid disease Brother        hyperthyroid    BP (!) 144/60 (BP Location: Right Arm, Patient Position: Sitting, Cuff Size: Normal)   Pulse 77   Ht '5\' 9"'$  (1.753 m)   Wt 179 lb 12.8 oz (81.6 kg)   SpO2 99%   BMI 26.55 kg/m   Review of Systems     Objective:   Physical Exam VITAL SIGNS:  See vs page GENERAL: no distress NECK: thyroid is slightly and diffusely enlarged  Lab Results  Component Value Date   TSH 0.29 (L) 04/08/2021       Assessment & Plan:  Hyperthyroidism: improved.  Please continue the same  tapazole.

## 2021-04-08 NOTE — Patient Instructions (Addendum)
Blood tests are requested for you today.  We'll let you know about the results.  As the amiodarone leaves your body, your thyroid will probably return to normal on its own.   Please come back for a follow-up appointment in 6 weeks.

## 2021-04-16 ENCOUNTER — Ambulatory Visit (INDEPENDENT_AMBULATORY_CARE_PROVIDER_SITE_OTHER): Payer: HMO

## 2021-04-16 DIAGNOSIS — I255 Ischemic cardiomyopathy: Secondary | ICD-10-CM

## 2021-04-17 LAB — CUP PACEART REMOTE DEVICE CHECK
Battery Remaining Longevity: 51 mo
Battery Remaining Percentage: 57 %
Battery Voltage: 2.95 V
Brady Statistic AP VP Percent: 1 %
Brady Statistic AP VS Percent: 28 %
Brady Statistic AS VP Percent: 1 %
Brady Statistic AS VS Percent: 71 %
Brady Statistic RA Percent Paced: 26 %
Brady Statistic RV Percent Paced: 1 %
Date Time Interrogation Session: 20220422040014
HighPow Impedance: 75 Ohm
HighPow Impedance: 75 Ohm
Implantable Lead Implant Date: 20140217
Implantable Lead Implant Date: 20190104
Implantable Lead Location: 753859
Implantable Lead Location: 753860
Implantable Lead Model: 7122
Implantable Pulse Generator Implant Date: 20190104
Lead Channel Impedance Value: 330 Ohm
Lead Channel Impedance Value: 580 Ohm
Lead Channel Pacing Threshold Amplitude: 1 V
Lead Channel Pacing Threshold Amplitude: 1.25 V
Lead Channel Pacing Threshold Pulse Width: 0.5 ms
Lead Channel Pacing Threshold Pulse Width: 0.8 ms
Lead Channel Sensing Intrinsic Amplitude: 0.6 mV
Lead Channel Sensing Intrinsic Amplitude: 12 mV
Lead Channel Setting Pacing Amplitude: 2.5 V
Lead Channel Setting Pacing Amplitude: 2.5 V
Lead Channel Setting Pacing Pulse Width: 0.5 ms
Lead Channel Setting Sensing Sensitivity: 0.5 mV
Pulse Gen Serial Number: 9777027

## 2021-04-19 DIAGNOSIS — J441 Chronic obstructive pulmonary disease with (acute) exacerbation: Secondary | ICD-10-CM | POA: Diagnosis not present

## 2021-04-19 DIAGNOSIS — I5020 Unspecified systolic (congestive) heart failure: Secondary | ICD-10-CM | POA: Diagnosis not present

## 2021-04-19 DIAGNOSIS — I255 Ischemic cardiomyopathy: Secondary | ICD-10-CM | POA: Diagnosis not present

## 2021-04-19 DIAGNOSIS — G4733 Obstructive sleep apnea (adult) (pediatric): Secondary | ICD-10-CM | POA: Diagnosis not present

## 2021-04-19 DIAGNOSIS — Z9989 Dependence on other enabling machines and devices: Secondary | ICD-10-CM | POA: Diagnosis not present

## 2021-04-19 DIAGNOSIS — I480 Paroxysmal atrial fibrillation: Secondary | ICD-10-CM | POA: Diagnosis not present

## 2021-04-20 ENCOUNTER — Other Ambulatory Visit: Payer: Self-pay

## 2021-04-20 MED ORDER — ATORVASTATIN CALCIUM 40 MG PO TABS: 40.0000 mg | ORAL_TABLET | Freq: Every day | ORAL | 3 refills | Status: DC

## 2021-04-20 NOTE — Telephone Encounter (Signed)
Pt's medication was sent to pt's pharmacy as requested. Confirmation received.  °

## 2021-05-04 DIAGNOSIS — N183 Chronic kidney disease, stage 3 unspecified: Secondary | ICD-10-CM | POA: Diagnosis not present

## 2021-05-04 DIAGNOSIS — I5021 Acute systolic (congestive) heart failure: Secondary | ICD-10-CM | POA: Diagnosis not present

## 2021-05-04 DIAGNOSIS — J441 Chronic obstructive pulmonary disease with (acute) exacerbation: Secondary | ICD-10-CM | POA: Diagnosis not present

## 2021-05-05 NOTE — Progress Notes (Signed)
Remote ICD transmission.   

## 2021-05-12 ENCOUNTER — Ambulatory Visit: Payer: HMO

## 2021-05-12 DIAGNOSIS — I480 Paroxysmal atrial fibrillation: Secondary | ICD-10-CM | POA: Diagnosis not present

## 2021-05-12 DIAGNOSIS — G4733 Obstructive sleep apnea (adult) (pediatric): Secondary | ICD-10-CM | POA: Diagnosis not present

## 2021-05-12 DIAGNOSIS — J441 Chronic obstructive pulmonary disease with (acute) exacerbation: Secondary | ICD-10-CM | POA: Diagnosis not present

## 2021-05-12 DIAGNOSIS — R0602 Shortness of breath: Secondary | ICD-10-CM | POA: Diagnosis not present

## 2021-05-12 DIAGNOSIS — Z9989 Dependence on other enabling machines and devices: Secondary | ICD-10-CM | POA: Diagnosis not present

## 2021-05-12 DIAGNOSIS — R059 Cough, unspecified: Secondary | ICD-10-CM | POA: Diagnosis not present

## 2021-05-26 ENCOUNTER — Ambulatory Visit (INDEPENDENT_AMBULATORY_CARE_PROVIDER_SITE_OTHER): Payer: HMO | Admitting: Endocrinology

## 2021-05-26 ENCOUNTER — Other Ambulatory Visit: Payer: Self-pay | Admitting: *Deleted

## 2021-05-26 ENCOUNTER — Other Ambulatory Visit: Payer: Self-pay

## 2021-05-26 VITALS — BP 140/60 | HR 86 | Ht 69.0 in | Wt 176.5 lb

## 2021-05-26 DIAGNOSIS — E0590 Thyrotoxicosis, unspecified without thyrotoxic crisis or storm: Secondary | ICD-10-CM

## 2021-05-26 LAB — T4, FREE: Free T4: 0.8 ng/dL (ref 0.60–1.60)

## 2021-05-26 LAB — TSH: TSH: 0.6 u[IU]/mL (ref 0.35–4.50)

## 2021-05-26 MED ORDER — APIXABAN 5 MG PO TABS: 5.0000 mg | ORAL_TABLET | Freq: Two times a day (BID) | ORAL | 1 refills | Status: DC

## 2021-05-26 NOTE — Telephone Encounter (Signed)
Prescription refill request for Eliquis received. Indication: afib  Last office visit: Lovena Le 02/16/2021 Scr: 2.10, 04/05/2021 Age: 75 yo  Weight: 81.6 kg   Pt is on the correct dose of Eliquis per dosing criteria, prescription refill sent for Eliquis '5mg'$  BID.

## 2021-05-26 NOTE — Progress Notes (Signed)
Subjective:    Patient ID: Joe Wiggins, male    DOB: 1946/01/23, 75 y.o.   MRN: [id number]  HPI Pt returns for f/u of hyperthyroidism (pt was incidentally noted to have a goiter on 2019 carotid US; dedicated US showed small diffuse goiter; he was then dx'ed with hyperthyroidism in 2021, after pt was on amiodarone, which was then stopped; he has never been on thyroid medication).  pt states he feels well in general.  Specifically, he denies palpitations and tremor.  Past Medical History:  Diagnosis Date  . Abrasion of forearm, right 07/27/2019  . Acute respiratory failure ([place])   . AF (paroxysmal atrial fibrillation) ([place])   . AICD (automatic cardioverter/defibrillator) present   . CAD (coronary artery disease)   . Cardiomyopathy, ischemic   . Carotid artery occlusion   . Chronic kidney disease   . COPD (chronic obstructive pulmonary disease) ([place])   . Cough 11/13/2019  . Dementia without behavioral disturbance ([place])   . Dizziness 01/17/2016  . Facial rash 08/15/2018  . GERD (gastroesophageal reflux disease)   . Hyperlipidemia   . Hypertension   . Pre-syncope 01/16/2018  . Respiratory distress 12/27/2020  . Sleep apnea   . Stroke ([place])   . TIA (transient ischemic attack)     Past Surgical History:  Procedure Laterality Date  . CARDIAC PACEMAKER PLACEMENT    . CATARACT EXTRACTION    . CORONARY ARTERY BYPASS GRAFT    . CORONARY STENT PLACEMENT    . EYE SURGERY    . ICD IMPLANT N/A 12/29/2017   Procedure: ICD IMPLANT;  Surgeon: Evans Lance, MD;  Location: [facility];  Service: Cardiovascular;  Laterality: N/A;  . LEFT HEART CATH AND CORS/GRAFTS ANGIOGRAPHY N/A 12/29/2017   Procedure: LEFT HEART CATH AND CORS/GRAFTS ANGIOGRAPHY;  Surgeon: Martinique, Peter M, MD;  Location: [facility];  Service: Cardiovascular;  Laterality: N/A;    Social History   Socioeconomic History  . Marital status: Married    Spouse name: Not on file  . Number of children: Not on file  .  Years of education: Not on file  . Highest education level: Not on file  Occupational History  . Not on file  Tobacco Use  . Smoking status: Former Smoker    Types: Cigarettes    Quit date: 11/30/2012    Years since quitting: 8.4  . Smokeless tobacco: Never Used  Vaping Use  . Vaping Use: Never used  Substance and Sexual Activity  . Alcohol use: No  . Drug use: No  . Sexual activity: Not on file  Other Topics Concern  . Not on file  Social History Narrative  . Not on file   Social Determinants of Health   Financial Resource Strain: Not on file  Food Insecurity: No Food Insecurity  . Worried About Charity fundraiser in the Last Year: Never true  . Ran Out of Food in the Last Year: Never true  Transportation Needs: No Transportation Needs  . Lack of Transportation (Medical): No  . Lack of Transportation (Non-Medical): No  Physical Activity: Not on file  Stress: Not on file  Social Connections: Not on file  Intimate Partner Violence: Not on file    Current Outpatient Medications on File Prior to Visit  Medication Sig Dispense Refill  . albuterol (ACCUNEB) 0.63 MG/3ML nebulizer solution Take 3 mLs by nebulization 4 (four) times daily as needed for shortness of breath or wheezing. 3 ml inhale orally via  nebulizer four times a day    . albuterol (PROVENTIL HFA;VENTOLIN HFA) 108 (90 Base) MCG/ACT inhaler Inhale 2 puffs into the lungs every 6 (six) hours as needed for wheezing or shortness of breath.    Marland Kitchen ascorbic acid (VITAMIN C) 500 MG tablet Take 500 mg by mouth daily.    Marland Kitchen atorvastatin (LIPITOR) 40 MG tablet Take 1 tablet (40 mg total) by mouth daily. 90 tablet 3  . cholecalciferol (VITAMIN D) 1000 units tablet Take 1,000 Units by mouth daily.    . Cyanocobalamin (VITAMIN B 12 PO) Take 1 tablet by mouth daily.    . ferrous sulfate 325 (65 FE) MG tablet Take 325 mg by mouth daily with breakfast.    . Fluticasone-Umeclidin-Vilant (TRELEGY ELLIPTA) 100-62.5-25 MCG/INH AEPB  Inhale 1 puff into the lungs in the morning.    . folic acid (FOLVITE) A999333 MCG tablet Take 400 mcg by mouth daily.    Marland Kitchen labetalol (NORMODYNE) 300 MG tablet Take 1 tablet (300 mg total) by mouth 2 (two) times daily. 180 tablet 3  . Multiple Vitamin (MULTIVITAMIN) tablet Take 1 tablet by mouth daily.    . mupirocin ointment (BACTROBAN) 2 % Apply 1 application topically 2 (two) times daily. To buttocks    . nitroGLYCERIN (NITROSTAT) 0.4 MG SL tablet Place 1 tablet (0.4 mg total) under the tongue every 5 (five) minutes as needed for chest pain. 75 tablet 2  . omeprazole (PRILOSEC) 40 MG capsule Take 40 mg by mouth daily as needed (indigestion/heartburn).    . torsemide (DEMADEX) 20 MG tablet Take 40 mg by mouth daily. 1 Tablet Daily     No current facility-administered medications on file prior to visit.    Allergies  Allergen Reactions  . Ipratropium Other (See Comments)  . Metoprolol     Was bringing blood pressure too low and he would pass out.  . Morphine And Related Nausea Only  . Vicodin [Hydrocodone-Acetaminophen] Other (See Comments)    Hallucinations   . Penicillins Rash    Family History  Problem Relation Age of Onset  . Hypertension Mother   . Heart disease Mother   . Hypertension Sister   . Heart disease Sister   . Thyroid disease Sister   . Thyroid disease Brother        hyperthyroid    BP 140/60 (BP Location: Right Arm, Patient Position: Sitting, Cuff Size: Normal)   Pulse 86   Ht '5\' 9"'$  (1.753 m)   Wt 176 lb 8 oz (80.1 kg)   SpO2 98%   BMI 26.06 kg/m     Review of Systems     Objective:   Physical Exam VITAL SIGNS:  See vs page GENERAL: no distress NECK: There is no palpable thyroid enlargement.  No thyroid nodule is palpable.  No palpable lymphadenopathy at the anterior neck.    Lab Results  Component Value Date   TSH 0.60 05/26/2021      Assessment & Plan:  Hyperthyroidism, in remission off amiodarone.  No medication is needed now. Please come  back for a follow-up appointment in 3 months

## 2021-05-26 NOTE — Patient Instructions (Addendum)
Blood tests are requested for you today.  We'll let you know about the results.  As the amiodarone leaves your body, your thyroid will probably return to normal on its own.   Please come back for a follow-up appointment in 3 months.

## 2021-06-02 ENCOUNTER — Other Ambulatory Visit: Payer: Self-pay

## 2021-06-02 MED ORDER — NITROGLYCERIN 0.4 MG SL SUBL: 0.4000 mg | SUBLINGUAL_TABLET | SUBLINGUAL | 2 refills | Status: DC | PRN

## 2021-06-08 ENCOUNTER — Ambulatory Visit: Payer: HMO

## 2021-06-10 ENCOUNTER — Other Ambulatory Visit: Payer: Self-pay | Admitting: *Deleted

## 2021-06-10 ENCOUNTER — Other Ambulatory Visit: Payer: Self-pay

## 2021-06-10 MED ORDER — APIXABAN 5 MG PO TABS: 5.0000 mg | ORAL_TABLET | Freq: Two times a day (BID) | ORAL | 1 refills | Status: DC

## 2021-06-10 MED ORDER — NITROGLYCERIN 0.4 MG SL SUBL: 0.4000 mg | SUBLINGUAL_TABLET | SUBLINGUAL | 2 refills | Status: DC | PRN

## 2021-06-10 MED ORDER — LABETALOL HCL 300 MG PO TABS: 300.0000 mg | ORAL_TABLET | Freq: Two times a day (BID) | ORAL | 2 refills | Status: DC

## 2021-06-10 MED ORDER — ATORVASTATIN CALCIUM 40 MG PO TABS: 40.0000 mg | ORAL_TABLET | Freq: Every day | ORAL | 2 refills | Status: DC

## 2021-06-10 NOTE — Telephone Encounter (Signed)
Prescription refill request for Eliquis received. Indication: afib  Last office visit: Lovena Le 02/16/2021 Scr: 2.10, 04/05/2021 Age: 75 yo  Weight: 81.6 kg   Pt is on the correct dose of Eliquis per dosing criteria, prescription refill sent for Eliquis '5mg'$  BID.

## 2021-06-21 ENCOUNTER — Encounter: Payer: Self-pay | Admitting: Family Medicine

## 2021-06-21 ENCOUNTER — Other Ambulatory Visit: Payer: Self-pay

## 2021-06-21 ENCOUNTER — Ambulatory Visit (INDEPENDENT_AMBULATORY_CARE_PROVIDER_SITE_OTHER): Payer: Medicare HMO | Admitting: Family Medicine

## 2021-06-21 VITALS — BP 180/70 | HR 64 | Ht 69.0 in | Wt 181.2 lb

## 2021-06-21 DIAGNOSIS — M79671 Pain in right foot: Secondary | ICD-10-CM | POA: Diagnosis not present

## 2021-06-21 MED ORDER — PREDNISONE 10 MG PO TABS: ORAL_TABLET | ORAL | 0 refills | Status: DC

## 2021-06-21 NOTE — Patient Instructions (Signed)
Thank you for coming in to see Joe Wiggins today! Please see below to review our plan for today's visit:  1. Take 5 tablets of prednisone ('50mg'$  total) for the next 4 days. Then decrease by '10mg'$  every day until medicine is gone ('40mg'$  on July 1, '30mg'$  on July 2nd, '20mg'$  July 3rd, and then '10mg'$  July 4th). 2. You should feel better in the next 1-2 days. If you're feeling worse stop the medicine and let Joe Wiggins know. If you develop fevers, worsening redness, blistering, sores on your feet, body aches, chills, or other concerning symptoms, go to the ED.  3. Please see information below about foods to avoid with a history of gout.   Please call the clinic at [phone] if your symptoms worsen or you have any concerns. It was our pleasure to serve you!   Dr. Milus Banister [facility]   Gout  Gout is painful swelling of your joints. Gout is a type of arthritis. It is caused by having too much uric acid in your body. Uric acid is a chemical that is made when your body breaks down substances called purines. If your body has too much uric acid, sharp crystals can form and build up in your joints. Thiscauses pain and swelling. Gout attacks can happen quickly and be very painful (acute gout). Over time, the attacks can affect more joints and happen more often (chronic gout). What are the causes? Too much uric acid in your blood. This can happen because: Your kidneys do not remove enough uric acid from your blood. Your body makes too much uric acid. You eat too many foods that are high in purines. These foods include organ meats, some seafood, and beer. Trauma or stress. What increases the risk? Having a family history of gout. Being male and middle-aged. Being male and having gone through menopause. Being very overweight (obese). Drinking alcohol, especially beer. Not having enough water in the body (being dehydrated). Losing weight too quickly. Having an organ transplant. Having lead  poisoning. Taking certain medicines. Having kidney disease. Having a skin condition called psoriasis. What are the signs or symptoms? An attack of acute gout usually happens in just one joint. The most common place is the big toe. Attacks often start at night. Other joints that may be affected include joints of the feet, ankle, knee, fingers, wrist, or elbow. Symptoms of an attack may include: Very bad pain. Warmth. Swelling. Stiffness. Shiny, red, or purple skin. Tenderness. The affected joint may be very painful to touch. Chills and fever. Chronic gout may cause symptoms more often. More joints may be involved. You may also have white or yellow lumps (tophi) on your hands or feet or in other areas near your joints. How is this treated? Treatment for this condition has two phases: treating an acute attack and preventing future attacks. Acute gout treatment may include: NSAIDs. Steroids. These are taken by mouth or injected into a joint. Colchicine. This medicine relieves pain and swelling. It can be given by mouth or through an IV tube. Preventive treatment may include: Taking small doses of NSAIDs or colchicine daily. Using a medicine that reduces uric acid levels in your blood. Making changes to your diet. You may need to see a food expert (dietitian) about what to eat and drink to prevent gout. Follow these instructions at home: During a gout attack  If told, put ice on the painful area: Put ice in a plastic bag. Place a towel between your skin  and the bag. Leave the ice on for 20 minutes, 2-3 times a day. Raise (elevate) the painful joint above the level of your heart as often as you can. Rest the joint as much as possible. If the joint is in your leg, you may be given crutches. Follow instructions from your doctor about what you cannot eat or drink.  Avoiding future gout attacks Eat a low-purine diet. Avoid foods and drinks such  as: Liver. Kidney. Anchovies. Asparagus. Herring. Mushrooms. Mussels. Beer. Stay at a healthy weight. If you want to lose weight, talk with your doctor. Do not lose weight too fast. Start or continue an exercise plan as told by your doctor. Eating and drinking Drink enough fluids to keep your pee (urine) pale yellow. If you drink alcohol: Limit how much you use to: 0-1 drink a day for women. 0-2 drinks a day for men. Be aware of how much alcohol is in your drink. In the [place] one drink equals one 12 oz bottle of beer (355 mL), one 5 oz glass of wine (148 mL), or one 1 oz glass of hard liquor (44 mL). General instructions Take over-the-counter and prescription medicines only as told by your doctor. Do not drive or use heavy machinery while taking prescription pain medicine. Return to your normal activities as told by your doctor. Ask your doctor what activities are safe for you. Keep all follow-up visits as told by your doctor. This is important. Contact a doctor if: You have another gout attack. You still have symptoms of a gout attack after 10 days of treatment. You have problems (side effects) because of your medicines. You have chills or a fever. You have burning pain when you pee (urinate). You have pain in your lower back or belly. Get help right away if: You have very bad pain. Your pain cannot be controlled. You cannot pee. Summary Gout is painful swelling of the joints. The most common site of pain is the big toe, but it can affect other joints. Medicines and avoiding some foods can help to prevent and treat gout attacks. This information is not intended to replace advice given to you by your health care provider. Make sure you discuss any questions you have with your healthcare provider. Document Revised: 07/04/2018 Document Reviewed: 07/04/2018 Elsevier Patient Education  [place].

## 2021-06-21 NOTE — Progress Notes (Signed)
    SUBJECTIVE:   CHIEF COMPLAINT / HPI:   Right foot: pain and redness started yesterday/Sunday 6/26, since then has gotten worse, more red and has had worsening swelling.  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  Patient denies any fevers, body aches, chills.   PERTINENT  PMH / PSH:  Patient Active Problem List   Diagnosis Date Noted   Foot pain, right [id number]   Chronic systolic heart failure ([place]) 02/16/2021   Screen for colon cancer 01/08/2021   Hyperthyroidism 01/08/2021   Dyspnea 12/27/2020   Acute CHF (congestive heart failure) ([place]) 12/27/2020   Venous insufficiency 05/27/2020   Sinus node dysfunction ([place]) 01/23/2020   Gout 01/21/2020   Hypertension 10/30/2019   Orthostatic hypotension 03/25/2018   Bradycardia 03/25/2018   History of cardiac arrest 03/25/2018   Vascular dementia without behavioral disturbance ([place]) 03/25/2018   Seasonal allergies 03/14/2018   ICD (implantable cardioverter-defibrillator) in place    Physical deconditioning    Cardiac arrest ([place])    CAD (coronary artery disease), native coronary artery 01/20/2016   Long term current use of anticoagulant therapy 01/20/2016   History of TIA (transient ischemic attack) and stroke 01/20/2016   Sleep apnea    Cardiac pacemaker in situ    Anemia 01/17/2016   Bilateral carotid artery stenosis    Hyperlipidemia    Dementia without behavioral disturbance (HCC)    Hypertensive heart disease without CHF    Paroxysmal atrial fibrillation (HCC)    Chronic kidney disease, stage 3 (HCC)    COPD (chronic obstructive pulmonary disease) (HCC)      OBJECTIVE:   BP (!) 180/70   Pulse 64   Ht '5\' 9"'$  (1.753 m)   Wt 181 lb 4 oz (82.2 kg)   SpO2 98%   BMI 26.77 kg/m    Physical exam: General: well-appearing patient, no acute distress, nontoxic-appearing Respiratory: Comfortable work of breathing on room air,  patient speaking complete sentences RIGHT Foot: Inspection:  No obvious bony deformity, bruising.  Swelling over first MTP and erythema over first MTP appreciated.  Normal arch.  Acute podagra appreciated. Palpation: tenderness to palpation appreciated over first MTP and to dorsal aspect of foot ROM: Full  ROM of the ankle. Normal midfoot flexibility Strength: 5/5 strength ankle in all planes Neurovascular: N/V intact distally in the lower extremity (1+ DP pulse appreciated bilaterally)  ASSESSMENT/PLAN:   Foot pain, right Patient with acute right foot pain, presenting today with what appears to be acute podagra.  Patient had similar presentation in January 2021.  Cannot 100% rule out cellulitis or underlying infection. -We will assess CBC, uric acid -Patient started on 50 mg of prednisone for the next 4 days, followed by decreasing dose of medication 10 mg daily until medication is complete.  (Last dose of medication should be on July 4) -Patient was instructed that if his symptoms worsen to discontinue the prednisone and reach back out to us/seek emergency medical care -Strict return precautions provided     Daisy Floro, [place]

## 2021-06-21 NOTE — Assessment & Plan Note (Signed)
Patient with acute right foot pain, presenting today with what appears to be acute podagra.  Patient had similar presentation in January 2021.  Cannot 100% rule out cellulitis or underlying infection. -We will assess CBC, uric acid -Patient started on 50 mg of prednisone for the next 4 days, followed by decreasing dose of medication 10 mg daily until medication is complete.  (Last dose of medication should be on July 4) -Patient was instructed that if his symptoms worsen to discontinue the prednisone and reach back out to us/seek emergency medical care -Strict return precautions provided

## 2021-06-22 LAB — CBC
Hematocrit: 34.7 % — ABNORMAL LOW (ref 37.5–51.0)
Hemoglobin: 11.3 g/dL — ABNORMAL LOW (ref 13.0–17.7)
MCH: 29.8 pg (ref 26.6–33.0)
MCHC: 32.6 g/dL (ref 31.5–35.7)
MCV: 92 fL (ref 79–97)
Platelets: 317 10*3/uL (ref 150–450)
RBC: 3.79 x10E6/uL — ABNORMAL LOW (ref 4.14–5.80)
RDW: 14.5 % (ref 11.6–15.4)
WBC: 7.9 10*3/uL (ref 3.4–10.8)

## 2021-06-22 LAB — URIC ACID: Uric Acid: 9.5 mg/dL — ABNORMAL HIGH (ref 3.8–8.4)

## 2021-06-23 DIAGNOSIS — J9611 Chronic respiratory failure with hypoxia: Secondary | ICD-10-CM | POA: Diagnosis not present

## 2021-06-23 DIAGNOSIS — G4733 Obstructive sleep apnea (adult) (pediatric): Secondary | ICD-10-CM | POA: Diagnosis not present

## 2021-06-23 DIAGNOSIS — Z9989 Dependence on other enabling machines and devices: Secondary | ICD-10-CM | POA: Diagnosis not present

## 2021-06-23 DIAGNOSIS — I5030 Unspecified diastolic (congestive) heart failure: Secondary | ICD-10-CM | POA: Diagnosis not present

## 2021-06-23 DIAGNOSIS — Z87891 Personal history of nicotine dependence: Secondary | ICD-10-CM | POA: Diagnosis not present

## 2021-06-23 DIAGNOSIS — J441 Chronic obstructive pulmonary disease with (acute) exacerbation: Secondary | ICD-10-CM | POA: Diagnosis not present

## 2021-06-24 ENCOUNTER — Encounter: Payer: Self-pay | Admitting: Family Medicine

## 2021-06-30 DIAGNOSIS — J9611 Chronic respiratory failure with hypoxia: Secondary | ICD-10-CM | POA: Diagnosis not present

## 2021-07-04 DIAGNOSIS — N183 Chronic kidney disease, stage 3 unspecified: Secondary | ICD-10-CM | POA: Diagnosis not present

## 2021-07-04 DIAGNOSIS — I5021 Acute systolic (congestive) heart failure: Secondary | ICD-10-CM | POA: Diagnosis not present

## 2021-07-05 ENCOUNTER — Other Ambulatory Visit: Payer: Self-pay

## 2021-07-05 DIAGNOSIS — M79671 Pain in right foot: Secondary | ICD-10-CM

## 2021-07-05 NOTE — Telephone Encounter (Signed)
Patients wife calls nurse line requesting a refill on Prednisone. Wife reports he was given Prednisone for severe gout in right foot a few weeks ago. Wife reports gout in left foot now. Wife reports very swollen and tender to touch. Will forward to PCP.

## 2021-07-06 DIAGNOSIS — M1000 Idiopathic gout, unspecified site: Secondary | ICD-10-CM | POA: Diagnosis not present

## 2021-07-06 NOTE — Telephone Encounter (Signed)
We have no apts until Friday. Pateint advised to go to UC, as he felt he could not wait that long.

## 2021-07-16 ENCOUNTER — Ambulatory Visit (INDEPENDENT_AMBULATORY_CARE_PROVIDER_SITE_OTHER): Payer: Medicare HMO

## 2021-07-16 DIAGNOSIS — I255 Ischemic cardiomyopathy: Secondary | ICD-10-CM | POA: Diagnosis not present

## 2021-07-19 LAB — CUP PACEART REMOTE DEVICE CHECK
Battery Remaining Longevity: 48 mo
Battery Remaining Percentage: 54 %
Battery Voltage: 2.93 V
Brady Statistic AP VP Percent: 1 %
Brady Statistic AP VS Percent: 29 %
Brady Statistic AS VP Percent: 1 %
Brady Statistic AS VS Percent: 70 %
Brady Statistic RA Percent Paced: 28 %
Brady Statistic RV Percent Paced: 1 %
Date Time Interrogation Session: 20220722040015
HighPow Impedance: 74 Ohm
HighPow Impedance: 74 Ohm
Implantable Lead Implant Date: 20140217
Implantable Lead Implant Date: 20190104
Implantable Lead Location: 753859
Implantable Lead Location: 753860
Implantable Lead Model: 7122
Implantable Pulse Generator Implant Date: 20190104
Lead Channel Impedance Value: 330 Ohm
Lead Channel Impedance Value: 590 Ohm
Lead Channel Pacing Threshold Amplitude: 1 V
Lead Channel Pacing Threshold Amplitude: 1.25 V
Lead Channel Pacing Threshold Pulse Width: 0.5 ms
Lead Channel Pacing Threshold Pulse Width: 0.8 ms
Lead Channel Sensing Intrinsic Amplitude: 0.9 mV
Lead Channel Sensing Intrinsic Amplitude: 12 mV
Lead Channel Setting Pacing Amplitude: 2.5 V
Lead Channel Setting Pacing Amplitude: 2.5 V
Lead Channel Setting Pacing Pulse Width: 0.5 ms
Lead Channel Setting Sensing Sensitivity: 0.5 mV
Pulse Gen Serial Number: 9777027

## 2021-07-31 DIAGNOSIS — J9611 Chronic respiratory failure with hypoxia: Secondary | ICD-10-CM | POA: Diagnosis not present

## 2021-08-04 DIAGNOSIS — N183 Chronic kidney disease, stage 3 unspecified: Secondary | ICD-10-CM | POA: Diagnosis not present

## 2021-08-04 DIAGNOSIS — I5021 Acute systolic (congestive) heart failure: Secondary | ICD-10-CM | POA: Diagnosis not present

## 2021-08-10 NOTE — Progress Notes (Signed)
Remote ICD transmission.   

## 2021-08-19 ENCOUNTER — Encounter: Payer: Self-pay | Admitting: Cardiology

## 2021-08-19 ENCOUNTER — Ambulatory Visit: Payer: Medicare HMO | Admitting: Cardiology

## 2021-08-19 ENCOUNTER — Other Ambulatory Visit: Payer: Self-pay

## 2021-08-19 ENCOUNTER — Telehealth: Payer: Self-pay | Admitting: Cardiology

## 2021-08-19 VITALS — BP 209/80 | HR 83 | Ht 69.0 in | Wt 188.0 lb

## 2021-08-19 DIAGNOSIS — I10 Essential (primary) hypertension: Secondary | ICD-10-CM | POA: Diagnosis not present

## 2021-08-19 DIAGNOSIS — N1832 Chronic kidney disease, stage 3b: Secondary | ICD-10-CM

## 2021-08-19 DIAGNOSIS — I5043 Acute on chronic combined systolic (congestive) and diastolic (congestive) heart failure: Secondary | ICD-10-CM | POA: Diagnosis not present

## 2021-08-19 MED ORDER — TORSEMIDE 40 MG PO TABS: 80.0000 mg | ORAL_TABLET | Freq: Two times a day (BID) | ORAL | 0 refills | Status: DC

## 2021-08-19 MED ORDER — TORSEMIDE 20 MG PO TABS: 80.0000 mg | ORAL_TABLET | Freq: Two times a day (BID) | ORAL | Status: DC

## 2021-08-19 MED ORDER — AMLODIPINE BESYLATE 5 MG PO TABS: 5.0000 mg | ORAL_TABLET | Freq: Every day | ORAL | 3 refills | Status: DC

## 2021-08-19 NOTE — Telephone Encounter (Signed)
Pt with increased edema.  Last seen in February-due for 6 month follow up.  Pt scheduled today at 3:30 pm.  Wife aware.

## 2021-08-19 NOTE — Patient Instructions (Signed)
Medication Instructions:  INCREASE torsemide to 80 mg two times daily  Weigh yourself daily, write it down.  If you gain 3 lbs overnight or 5 lbs in 1 week, please call.  Bring log to appointment on Monday.  START amlodipine 5 mg daily  Please check your blood pressure at home daily, write it down.  Bring blood pressure log and blood pressure cuff to appointment on Monday  *If you need a refill on your cardiac medications before your next appointment, please call your pharmacy*  Lab Work: CMET, CBC, BNP, TSH today  If you have labs (blood work) drawn today and your tests are completely normal, you will receive your results only by: [place] (if you have MyChart) OR A paper copy in the mail If you have any lab test that is abnormal or we need to change your treatment, we will call you to review the results.  Follow-Up: At [facility], you and your health needs are our priority.  As part of our continuing mission to provide you with exceptional heart care, we have created designated Provider Care Teams.  These Care Teams include your primary Cardiologist (physician) and Advanced Practice Providers (APPs -  Physician Assistants and Nurse Practitioners) who all work together to provide you with the care you need, when you need it.  We recommend signing up for the patient portal called "MyChart".  Sign up information is provided on this After Visit Summary.  MyChart is used to connect with patients for Virtual Visits (Telemedicine).  Patients are able to view lab/test results, encounter notes, upcoming appointments, etc.  Non-urgent messages can be sent to your provider as well.   To learn more about what you can do with MyChart, go to [email or web address].    Your next appointment:   Monday, 8/29 at 3:15 pm with Coletta Memos NP

## 2021-08-19 NOTE — Telephone Encounter (Signed)
Pt c/o swelling: STAT is pt has developed SOB within 24 hours  How much weight have you gained and in what time span?  [name] states the patient has gained about 7 lbs in the past few weeks, but weight mainly fluctuates   If swelling, where is the swelling located?  Stomach   Are you currently taking a fluid pill?  torsemide (DEMADEX) 20 MG tablet  Are you currently SOB?  No   Do you have a log of your daily weights (if so, list)?  08/25: 180 08/24: 182 08/23: 179 08/22: 182  Have you gained 3 pounds in a day or 5 pounds in a week?  [name] states the patient's weight fluctuates--some days he gains 3 lbs and some days he loses weight.  Have you traveled recently?  No

## 2021-08-19 NOTE — Progress Notes (Signed)
Cardiology Office Note:    Date:  08/19/2021   ID:  Dekari Koetting, DOB 02-Apr-1946, MRN [id number]  PCP:  Ezequiel Essex, MD  Cardiologist:  None  Electrophysiologist:  Cristopher Peru, MD   Referring MD: Ezequiel Essex, MD   Chief Complaint  Patient presents with   Congestive Heart Failure     History of Present Illness:    Camillo Ochsner is a 75 y.o. male with a hx of ischemic cardiomyopathy status post ICD, atrial fibrillation on Eliquis and amiodarone, COPD, OSA, CVA, hypertension, hyperlipidemia, CKD stage IIIb who presents for follow-up.  He was admitted from 1/2 through 12/29/2020 after presenting with worsening shortness of breath.  He also had a fall.  Etiology of dyspnea felt to be secondary to COPD and heart failure exacerbation.  He was treated with prednisone and doxycycline and received diuresis.  Echocardiogram on 12/28/2020 showed LVEF 45 to 50%, septal hypokinesis and dyssynchrony consistent with RV pacing, concentric LVH, grade 2 diastolic dysfunction.  He reports that he is not having lightheadedness with standing.  BP 150s to 160s at home.  Denies any syncope.  Denies any chest pain but reports dyspnea with minimal exertion.  Has been monitoring weights, reports has been stable.  Does note occasional lower extremity edema.  Has been taking Eliquis, reports some epistaxis but denies any other bleeding issues  Since last clinic visit, he reports he has been having worsening shortness of breath.  Nephrologist increase torsemide to 40 mg 3 times daily instructed to take fourth dose if needed for weight gain.  Reports he has been short of breath with minimal exertion.  Reports occasional lightheadedness, denies any syncope.  Denies any chest pain.  Reports BP up to 170s at home.    Wt Readings from Last 3 Encounters:  08/19/21 188 lb (85.3 kg)  06/21/21 181 lb 4 oz (82.2 kg)  05/26/21 176 lb 8 oz (80.1 kg)   BP Readings from Last 3 Encounters:  08/19/21 (!) 209/80  06/21/21 (!)  180/70  05/26/21 140/60     Past Medical History:  Diagnosis Date   Abrasion of forearm, right 07/27/2019   Acute respiratory failure (HCC)    AF (paroxysmal atrial fibrillation) (HCC)    AICD (automatic cardioverter/defibrillator) present    CAD (coronary artery disease)    Cardiomyopathy, ischemic    Carotid artery occlusion    Chronic kidney disease    COPD (chronic obstructive pulmonary disease) ([place])    Cough 11/13/2019   Dementia without behavioral disturbance ([place])    Dizziness 01/17/2016   Facial rash 08/15/2018   GERD (gastroesophageal reflux disease)    Hyperlipidemia    Hypertension    Pre-syncope 01/16/2018   Respiratory distress 12/27/2020   Sleep apnea    Stroke ([place])    TIA (transient ischemic attack)     Past Surgical History:  Procedure Laterality Date   CARDIAC PACEMAKER PLACEMENT     CATARACT EXTRACTION     CORONARY ARTERY BYPASS GRAFT     CORONARY STENT PLACEMENT     EYE SURGERY     ICD IMPLANT N/A 12/29/2017   Procedure: ICD IMPLANT;  Surgeon: Evans Lance, MD;  Location: [facility];  Service: Cardiovascular;  Laterality: N/A;   LEFT HEART CATH AND CORS/GRAFTS ANGIOGRAPHY N/A 12/29/2017   Procedure: LEFT HEART CATH AND CORS/GRAFTS ANGIOGRAPHY;  Surgeon: Martinique, Peter M, MD;  Location: [facility];  Service: Cardiovascular;  Laterality: N/A;    Current Medications: Current Meds  Medication Sig   albuterol (ACCUNEB) 0.63 MG/3ML nebulizer solution Take 3 mLs by nebulization 4 (four) times daily as needed for shortness of breath or wheezing. 3 ml inhale orally via nebulizer four times a day   albuterol (PROVENTIL HFA;VENTOLIN HFA) 108 (90 Base) MCG/ACT inhaler Inhale 2 puffs into the lungs every 6 (six) hours as needed for wheezing or shortness of breath.   amLODipine (NORVASC) 5 MG tablet Take 1 tablet (5 mg total) by mouth daily.   apixaban (ELIQUIS) 5 MG TABS tablet Take 1 tablet (5 mg total) by mouth 2 (two) times daily.   ascorbic acid  (VITAMIN C) 500 MG tablet Take 500 mg by mouth daily.   atorvastatin (LIPITOR) 40 MG tablet Take 1 tablet (40 mg total) by mouth daily.   cholecalciferol (VITAMIN D) 1000 units tablet Take 1,000 Units by mouth daily.   Cyanocobalamin (VITAMIN B 12 PO) Take 1 tablet by mouth daily.   ferrous sulfate 325 (65 FE) MG tablet Take 325 mg by mouth daily with breakfast.   Fluticasone-Umeclidin-Vilant (TRELEGY ELLIPTA) 100-62.5-25 MCG/INH AEPB Inhale 1 puff into the lungs in the morning.   folic acid (FOLVITE) A999333 MCG tablet Take 400 mcg by mouth daily.   labetalol (NORMODYNE) 300 MG tablet Take 1 tablet (300 mg total) by mouth 2 (two) times daily.   Multiple Vitamin (MULTIVITAMIN) tablet Take 1 tablet by mouth daily.   mupirocin ointment (BACTROBAN) 2 % Apply 1 application topically 2 (two) times daily. To buttocks   nitroGLYCERIN (NITROSTAT) 0.4 MG SL tablet Place 1 tablet (0.4 mg total) under the tongue every 5 (five) minutes as needed for chest pain.   omeprazole (PRILOSEC) 40 MG capsule Take 40 mg by mouth daily as needed (indigestion/heartburn).   predniSONE (DELTASONE) 10 MG tablet Take '50mg'$  (5 tablets) x4 days. Then decrease by '10mg'$  until the medicine is gone.   [DISCONTINUED] torsemide (DEMADEX) 20 MG tablet Take 40 mg by mouth daily. 1 Tablet Daily     Allergies:   Ipratropium, Metoprolol, Morphine and related, Vicodin [hydrocodone-acetaminophen], and Penicillins   Social History   Socioeconomic History   Marital status: Married    Spouse name: Not on file   Number of children: Not on file   Years of education: Not on file   Highest education level: Not on file  Occupational History   Not on file  Tobacco Use   Smoking status: Former    Types: Cigarettes    Quit date: 11/30/2012    Years since quitting: 8.7   Smokeless tobacco: Never  Vaping Use   Vaping Use: Never used  Substance and Sexual Activity   Alcohol use: No   Drug use: No   Sexual activity: Not on file  Other Topics  Concern   Not on file  Social History Narrative   Not on file   Social Determinants of Health   Financial Resource Strain: Not on file  Food Insecurity: Not on file  Transportation Needs: Not on file  Physical Activity: Not on file  Stress: Not on file  Social Connections: Not on file     Family History: The patient's family history includes Heart disease in his mother and sister; Hypertension in his mother and sister; Thyroid disease in his brother and sister.  ROS:   Please see the history of present illness.    All other systems reviewed and are negative.  EKGs/Labs/Other Studies Reviewed:    The following studies were reviewed today:   EKG:  EKG is ordered today and shows sinus rhythm, left bundle branch block, rate 83  Recent Labs: 01/06/2021: ALT 48 04/05/2021: BNP 508.5; BUN 17; Creatinine, Ser 2.10; Magnesium 1.8; Potassium 4.3; Sodium 139 05/26/2021: TSH 0.60 06/21/2021: Hemoglobin 11.3; Platelets 317  Recent Lipid Panel    Component Value Date/Time   CHOL 104 01/06/2021 1203   TRIG 65 01/06/2021 1203   HDL 54 01/06/2021 1203   CHOLHDL 1.9 01/06/2021 1203   CHOLHDL 4.6 12/26/2017 0845   VLDL 33 12/26/2017 0845   LDLCALC 36 01/06/2021 1203    Physical Exam:    VS:  BP (!) 209/80   Pulse 83   Ht '5\' 9"'$  (1.753 m)   Wt 188 lb (85.3 kg)   SpO2 99%   BMI 27.76 kg/m     Wt Readings from Last 3 Encounters:  08/19/21 188 lb (85.3 kg)  06/21/21 181 lb 4 oz (82.2 kg)  05/26/21 176 lb 8 oz (80.1 kg)     GEN:  in no acute distress HEENT: Normal NECK: No JVD CARDIAC: RRR, 2 out of 6 systolic murmur RESPIRATORY: Bibasilar crackles ABDOMEN: Soft, non-tender, non-distended MUSCULOSKELETAL: 1+ edema SKIN: Warm and dry NEUROLOGIC:  Alert and oriented x 3 PSYCHIATRIC:  Normal affect   ASSESSMENT:    1. Acute on chronic combined systolic and diastolic CHF (congestive heart failure) ([place])   2. Essential hypertension   3. Stage 3b chronic kidney disease (HCC)      PLAN:    Acute on chronic combined systolic and diastolic heart failure: Status post CABG (LIMA-LAD, SVG-D1, SVG-OM, SVG-PDA as sequential graft) on 06/18/2013.  Most recent cath 12/29/2017 showed severe three-vessel occlusive CAD, patent LIMA-LAD, patent sequential SVG- OM and PDA, patent Y graft SVG-diagonal.  Echocardiogram 12/28/2020 with EF 45 to A999333, grade 2 diastolic dysfunction, septal hypokinesis and dyssynchrony consistent with RV pacing.  Status post AICD placement -Follows with the EP for ICD management -Currently taking torsemide 40 mg 3 times daily.  Weight is up 13 pounds since last clinic visit, bibasilar crackles on exam.  Recommend increasing torsemide dose to 80 mg twice daily.  Will check CMP, BNP, CBC, TSH.  Discussed that if worsening shortness of breath or developing any lightheadedness/syncope or chest pain, should go to the ED.  We will schedule close follow-up for next week.  Continue to monitor daily weights  Atrial fibrillation: CHA2DS2-VASc score 7 (CHF, hypertension, age x2, CVA x2, CAD) -Continue Eliquis -Continue labetalol -Discontinued amiodarone due to hyperthyroidism -Follows with EP  CAD: Status post CABG as above.  Continue Eliquis and statin.  Hypertension: On labetalol 300 mg twice daily.  BP significantly elevated in clinic (209/80 initially, on recheck had improved to 174/70).  We will add amlodipine 5 mg daily.  Advised to continue to check BP daily.  CKD stage IIIb: Baseline creatinine around 2.2, was up to 3.0 on admission on 12/27/20, was 2.6 on discharge.  Follows with nephrology.  Will check CMP as above  Hyperlipidemia: Continue atorvastatin 40 mg daily.  LDL 36 on 01/06/2021  Hyperthyroidism: Labs on 01/06/2021 showed TSH 0.011, free T4 2.19, consistent with hyperthyroidism.  Discussed with Dr. Lovena Le, discontinued amiodarone.  Referred to endocrinology.  He was seen by Dr. Loanne Drilling.  Hyperthyroidism resolved off amiodarone.  OSA: On CPAP, reports  compliance  RTC in 1 week   Medication Adjustments/Labs and Tests Ordered: Current medicines are reviewed at length with the patient today.  Concerns regarding medicines are outlined above.  Orders Placed This Encounter  Procedures   Comprehensive metabolic panel   CBC   TSH   Brain natriuretic peptide   EKG 12-Lead    Meds ordered this encounter  Medications   DISCONTD: torsemide (DEMADEX) 20 MG tablet    Sig: Take 4 tablets (80 mg total) by mouth 2 (two) times daily.   amLODipine (NORVASC) 5 MG tablet    Sig: Take 1 tablet (5 mg total) by mouth daily.    Dispense:  90 tablet    Refill:  3   torsemide 40 MG TABS    Sig: Take 80 mg by mouth 2 (two) times daily.    Dispense:  28 tablet    Refill:  0     Patient Instructions  Medication Instructions:  INCREASE torsemide to 80 mg two times daily  Weigh yourself daily, write it down.  If you gain 3 lbs overnight or 5 lbs in 1 week, please call.  Bring log to appointment on Monday.  START amlodipine 5 mg daily  Please check your blood pressure at home daily, write it down.  Bring blood pressure log and blood pressure cuff to appointment on Monday  *If you need a refill on your cardiac medications before your next appointment, please call your pharmacy*  Lab Work: CMET, CBC, BNP, TSH today  If you have labs (blood work) drawn today and your tests are completely normal, you will receive your results only by: [place] (if you have MyChart) OR A paper copy in the mail If you have any lab test that is abnormal or we need to change your treatment, we will call you to review the results.  Follow-Up: At [facility], you and your health needs are our priority.  As part of our continuing mission to provide you with exceptional heart care, we have created designated Provider Care Teams.  These Care Teams include your primary Cardiologist (physician) and Advanced Practice Providers (APPs -  Physician Assistants and Nurse  Practitioners) who all work together to provide you with the care you need, when you need it.  We recommend signing up for the patient portal called "MyChart".  Sign up information is provided on this After Visit Summary.  MyChart is used to connect with patients for Virtual Visits (Telemedicine).  Patients are able to view lab/test results, encounter notes, upcoming appointments, etc.  Non-urgent messages can be sent to your provider as well.   To learn more about what you can do with MyChart, go to [email or web address].    Your next appointment:   Monday, 8/29 at 3:15 pm with Coletta Memos NP   Signed, Donato Heinz, MD  08/19/2021 5:42 PM    [facility]

## 2021-08-20 LAB — COMPREHENSIVE METABOLIC PANEL
ALT: 12 IU/L (ref 0–44)
AST: 18 IU/L (ref 0–40)
Albumin/Globulin Ratio: 2.1 (ref 1.2–2.2)
Albumin: 4.1 g/dL (ref 3.7–4.7)
Alkaline Phosphatase: 145 IU/L — ABNORMAL HIGH (ref 44–121)
BUN/Creatinine Ratio: 9 — ABNORMAL LOW (ref 10–24)
BUN: 20 mg/dL (ref 8–27)
Bilirubin Total: 0.3 mg/dL (ref 0.0–1.2)
CO2: 25 mmol/L (ref 20–29)
Calcium: 9.9 mg/dL (ref 8.6–10.2)
Chloride: 101 mmol/L (ref 96–106)
Creatinine, Ser: 2.18 mg/dL — ABNORMAL HIGH (ref 0.76–1.27)
Globulin, Total: 2 g/dL (ref 1.5–4.5)
Glucose: 97 mg/dL (ref 65–99)
Potassium: 4 mmol/L (ref 3.5–5.2)
Sodium: 140 mmol/L (ref 134–144)
Total Protein: 6.1 g/dL (ref 6.0–8.5)
eGFR: 31 mL/min/{1.73_m2} — ABNORMAL LOW (ref >59)

## 2021-08-20 LAB — BRAIN NATRIURETIC PEPTIDE: BNP: 542.8 pg/mL — ABNORMAL HIGH (ref 0.0–100.0)

## 2021-08-20 LAB — CBC
Hematocrit: 29.8 % — ABNORMAL LOW (ref 37.5–51.0)
Hemoglobin: 10.5 g/dL — ABNORMAL LOW (ref 13.0–17.7)
MCH: 31.8 pg (ref 26.6–33.0)
MCHC: 35.2 g/dL (ref 31.5–35.7)
MCV: 90 fL (ref 79–97)
Platelets: 294 10*3/uL (ref 150–450)
RBC: 3.3 x10E6/uL — ABNORMAL LOW (ref 4.14–5.80)
RDW: 14.4 % (ref 11.6–15.4)
WBC: 6.9 10*3/uL (ref 3.4–10.8)

## 2021-08-20 LAB — TSH: TSH: 0.311 u[IU]/mL — ABNORMAL LOW (ref 0.450–4.500)

## 2021-08-22 NOTE — Progress Notes (Addendum)
Cardiology Clinic Note   [name] Date of Encounter: 08/23/2021  Primary Care Provider:  Ezequiel Essex, MD Primary Cardiologist:  Donato Heinz, MD  Patient Profile    Joe Wiggins 75 year old male presents clinic today for follow-up evaluation of his acute on chronic systolic and diastolic CHF.  Past Medical History    Past Medical History:  Diagnosis Date   Abrasion of forearm, right 07/27/2019   Acute respiratory failure (HCC)    AF (paroxysmal atrial fibrillation) (HCC)    AICD (automatic cardioverter/defibrillator) present    CAD (coronary artery disease)    Cardiomyopathy, ischemic    Carotid artery occlusion    Chronic kidney disease    COPD (chronic obstructive pulmonary disease) ([place])    Cough 11/13/2019   Dementia without behavioral disturbance (HCC)    Dizziness 01/17/2016   Facial rash 08/15/2018   GERD (gastroesophageal reflux disease)    Hyperlipidemia    Hypertension    Pre-syncope 01/16/2018   Respiratory distress 12/27/2020   Sleep apnea    Stroke ([place])    TIA (transient ischemic attack)    Past Surgical History:  Procedure Laterality Date   CARDIAC PACEMAKER PLACEMENT     CATARACT EXTRACTION     CORONARY ARTERY BYPASS GRAFT     CORONARY STENT PLACEMENT     EYE SURGERY     ICD IMPLANT N/A 12/29/2017   Procedure: ICD IMPLANT;  Surgeon: Evans Lance, MD;  Location: [facility];  Service: Cardiovascular;  Laterality: N/A;   LEFT HEART CATH AND CORS/GRAFTS ANGIOGRAPHY N/A 12/29/2017   Procedure: LEFT HEART CATH AND CORS/GRAFTS ANGIOGRAPHY;  Surgeon: Martinique, Peter M, MD;  Location: [facility];  Service: Cardiovascular;  Laterality: N/A;    Allergies  Allergies  Allergen Reactions   Ipratropium Other (See Comments)   Metoprolol     Was bringing blood pressure too low and he would pass out.   Morphine And Related Nausea Only   Vicodin [Hydrocodone-Acetaminophen] Other (See Comments)    Hallucinations     Penicillins Rash    History of Present Illness    Joe Wiggins has a PMH of essential hypertension, acute on chronic combined systolic and diastolic CHF, and stage III CKD.  His PMH also includes ischemic cardiomyopathy status post ICD, atrial fibrillation on Eliquis and amiodarone, COPD, OSA, CVA, HLD, and HTN.  He was admitted to the hospital 12/27/20 until 12/29/2020.  He presented to the emergency department with increasing shortness of breath.  He also reported a fall.  His shortness of breath was felt to be related to his COPD and heart failure exacerbation.  He was treated with prednisone and doxycycline as well as diuresis.  His echocardiogram 12/28/2020 showed an LVEF of 45-50%, septal hypokinesis, and dyssynchrony consistent with RV pacing, concentric LVH, and G2 DD.  He he denied lightheadedness with standing.  His blood pressure was [id number] systolic at home.  He denied syncope.  He denied chest pain but reported dyspnea with minimal exertion.  His weight is remained stable.  He did note occasional lower extremity swelling.  He reported compliance with his Eliquis but did notice some epistaxis.  He denied of the bleeding issues.  He was seen in follow-up by Dr. Gardiner Rhyme 08/19/2021.  He did report worsening shortness of breath.  His nephrologist had increased his torsemide to 40 mg 3 times daily.  He was instructed to take a fourth dose if needed for weight gain.  He reported  shortness of breath with minimal exertion.  He did note occasional lightheadedness, denied syncope.  He denied chest pain.  He reported that his blood pressure in at home was elevated in the [id number] systolic.  His blood pressure in the clinic was 209/80 and on recheck 174/70.  He reported compliance with his labetalol 300 mg twice daily.  Amlodipine 5 mg daily was added to his medication regimen.  He was instructed to maintain a blood pressure log.  He presents the clinic today for follow-up evaluation states he feels well.  He  presents to the clinic today with his wife reports that after his increased torsemide she did not start amlodipine.  His blood pressure returned to be 120s over 70s.  We discussed the importance of maintaining proper fluid volume and avoiding high salt foods.  She reports that he does have occasional dietary indiscretion and enjoys several Pepsi colas per day.  I will give him the salty 6 diet sheet, have him increase his physical activity as tolerated, and follow-up in 3 to 4 months.  Today he denies chest pain, shortness of breath, lower extremity edema, fatigue, palpitations, melena, hematuria, hemoptysis, diaphoresis, weakness, presyncope, syncope, orthopnea, and PND.  Home Medications    Prior to Admission medications   Medication Sig Start Date End Date Taking? Authorizing Provider  albuterol (ACCUNEB) 0.63 MG/3ML nebulizer solution Take 3 mLs by nebulization 4 (four) times daily as needed for shortness of breath or wheezing. 3 ml inhale orally via nebulizer four times a day    [provider]  albuterol (PROVENTIL HFA;VENTOLIN HFA) 108 (90 Base) MCG/ACT inhaler Inhale 2 puffs into the lungs every 6 (six) hours as needed for wheezing or shortness of breath. 06/30/16   [provider]  amLODipine (NORVASC) 5 MG tablet Take 1 tablet (5 mg total) by mouth daily. 08/19/21 08/14/22  Donato Heinz, MD  apixaban (ELIQUIS) 5 MG TABS tablet Take 1 tablet (5 mg total) by mouth 2 (two) times daily. 06/10/21   Evans Lance, MD  ascorbic acid (VITAMIN C) 500 MG tablet Take 500 mg by mouth daily.    [provider]  atorvastatin (LIPITOR) 40 MG tablet Take 1 tablet (40 mg total) by mouth daily. 06/10/21   Evans Lance, MD  cholecalciferol (VITAMIN D) 1000 units tablet Take 1,000 Units by mouth daily.    [provider]  Cyanocobalamin (VITAMIN B 12 PO) Take 1 tablet by mouth daily.    [provider]  ferrous sulfate 325 (65 FE) MG tablet Take 325 mg by  mouth daily with breakfast. 01/03/18   [provider]  Fluticasone-Umeclidin-Vilant (TRELEGY ELLIPTA) 100-62.5-25 MCG/INH AEPB Inhale 1 puff into the lungs in the morning.    [provider]  folic acid (FOLVITE) [id number] MCG tablet Take 400 mcg by mouth daily.    [provider]  labetalol (NORMODYNE) 300 MG tablet Take 1 tablet (300 mg total) by mouth 2 (two) times daily. 06/10/21   Evans Lance, MD  Multiple Vitamin (MULTIVITAMIN) tablet Take 1 tablet by mouth daily.    [provider]  mupirocin ointment (BACTROBAN) 2 % Apply 1 application topically 2 (two) times daily. To buttocks 12/23/20   [provider]  nitroGLYCERIN (NITROSTAT) 0.4 MG SL tablet Place 1 tablet (0.4 mg total) under the tongue every 5 (five) minutes as needed for chest pain. 06/10/21   Evans Lance, MD  omeprazole (PRILOSEC) 40 MG capsule Take 40 mg by mouth daily  as needed (indigestion/heartburn). 12/21/20   [provider]  predniSONE (DELTASONE) 10 MG tablet Take '50mg'$  (5 tablets) x4 days. Then decrease by '10mg'$  until the medicine is gone. 06/21/21   Daisy Floro, DO  torsemide 40 MG TABS Take 80 mg by mouth 2 (two) times daily. 08/19/21   Donato Heinz, MD    Family History    Family History  Problem Relation Age of Onset   Hypertension Mother    Heart disease Mother    Hypertension Sister    Heart disease Sister    Thyroid disease Sister    Thyroid disease Brother        hyperthyroid   He indicated that his mother is deceased. He indicated that his father is deceased. He indicated that the status of his sister is unknown. He indicated that the status of his brother is unknown. He indicated that his maternal grandmother is deceased. He indicated that his maternal grandfather is deceased. He indicated that his paternal grandmother is deceased. He indicated that his paternal grandfather is deceased.  Social History    Social History    Socioeconomic History   Marital status: Married    Spouse name: Not on file   Number of children: Not on file   Years of education: Not on file   Highest education level: Not on file  Occupational History   Not on file  Tobacco Use   Smoking status: Former    Types: Cigarettes    Quit date: 11/30/2012    Years since quitting: 8.7   Smokeless tobacco: Never  Vaping Use   Vaping Use: Never used  Substance and Sexual Activity   Alcohol use: No   Drug use: No   Sexual activity: Not on file  Other Topics Concern   Not on file  Social History Narrative   Not on file   Social Determinants of Health   Financial Resource Strain: Not on file  Food Insecurity: Not on file  Transportation Needs: Not on file  Physical Activity: Not on file  Stress: Not on file  Social Connections: Not on file  Intimate Partner Violence: Not on file     Review of Systems    General:  No chills, fever, night sweats or weight changes.  Cardiovascular:  No chest pain, dyspnea on exertion, edema, orthopnea, palpitations, paroxysmal nocturnal dyspnea. Dermatological: No rash, lesions/masses Respiratory: No cough, dyspnea Urologic: No hematuria, dysuria Abdominal:   No nausea, vomiting, diarrhea, bright red blood per rectum, melena, or hematemesis Neurologic:  No visual changes, wkns, changes in mental status. All other systems reviewed and are otherwise negative except as noted above.  Physical Exam    VS:  BP 128/70 (BP Location: Right Arm, Patient Position: Sitting, Cuff Size: Normal)   Pulse 71   Ht '5\' 9"'$  (1.753 m)   Wt 187 lb 6.4 oz (85 kg)   SpO2 98%   BMI 27.67 kg/m  , BMI Body mass index is 27.67 kg/m. GEN: Well nourished, well developed, in no acute distress. HEENT: normal. Neck: Supple, no JVD, carotid bruits, or masses. Cardiac: RRR, no murmurs, rubs, or gallops. No clubbing, cyanosis, generalized bilateral lower extremity nonpitting edema.  Radials/DP/PT 2+ and equal  bilaterally.  Respiratory:  Respirations regular and unlabored, clear to auscultation bilaterally. GI: Soft, nontender, nondistended, BS + x 4. MS: no deformity or atrophy. Skin: warm and dry, no rash. Neuro:  Strength and sensation are intact. Psych: Normal affect.  Accessory Clinical Findings  Recent Labs: 04/05/2021: Magnesium 1.8 08/19/2021: ALT 12; BNP 542.8; BUN 20; Creatinine, Ser 2.18; Hemoglobin 10.5; Platelets 294; Potassium 4.0; Sodium 140; TSH 0.311   Recent Lipid Panel    Component Value Date/Time   CHOL 104 01/06/2021 1203   TRIG 65 01/06/2021 1203   HDL 54 01/06/2021 1203   CHOLHDL 1.9 01/06/2021 1203   CHOLHDL 4.6 12/26/2017 0845   VLDL 33 12/26/2017 0845   LDLCALC 36 01/06/2021 1203    ECG personally reviewed by me today-normal sinus rhythm left ventricular hypertrophy QRS widening and repolarization abnormality 71 bpm  Echocardiogram 12/28/2020  IMPRESSIONS     1. Septal hypokinesis and dyssynchrony consistent with right ventricular  pacing. Left ventricular ejection fraction, by estimation, is 45 to 50%.  The left ventricle has mildly decreased function. The left ventricle has  no regional wall motion  abnormalities. There is mild concentric left ventricular hypertrophy. Left  ventricular diastolic parameters are consistent with Grade II diastolic  dysfunction (pseudonormalization). Elevated left ventricular end-diastolic  pressure.   2. Right ventricular systolic function is normal. The right ventricular  size is normal. There is mildly elevated pulmonary artery systolic  pressure.   3. Right atrial size was moderately dilated.   4. The mitral valve is normal in structure. Mild mitral valve  regurgitation. No evidence of mitral stenosis.   5. Tricuspid valve regurgitation is mild to moderate.   6. The aortic valve is normal in structure. Aortic valve regurgitation is  not visualized. No aortic stenosis is present.   7. The inferior vena cava is  dilated in size with <50% respiratory  variability, suggesting right atrial pressure of 15 mmHg.  Assessment & Plan   1.  Essential hypertension-BP today 128/70.  Better control with additional torsemide.  Did not start amlodipine.   Continue torsemide, labetalol Heart healthy low-sodium diet-salty 6 given Increase physical activity as tolerated Maintain blood pressure log  Acute on chronic combined systolic and diastolic CHF-no increased DOE or activity intolerance.  Weight stable.  BNP 542 on 08/19/2021 Continue torsemide Heart healthy low-sodium diet-salty 6 given Increase physical activity as tolerated Lower extremity support stockings Fluid restriction 48-64 ounces daily Elevate lower extremities when not active  Stage III CKD-creatinine 2.18 on 08/19/2021..  Managed by nephrology.  Disposition: Follow-up with Dr. Gardiner Rhyme or me in 3-4 months.  Jossie Ng. Aundre Hietala NP-C    08/23/2021, 3:50 PM [facility] [place] [address] Office [phone] Fax [phone]  Notice: This dictation was prepared with Dragon dictation along with smaller phrase technology. Any transcriptional errors that result from this process are unintentional and may not be corrected upon review.  I spent 13 minutes examining this patient, reviewing medications, and using patient centered shared decision making involving her cardiac care.  Prior to her visit I spent greater than 20 minutes reviewing her past medical history,  medications, and prior cardiac tests.

## 2021-08-23 ENCOUNTER — Ambulatory Visit: Payer: Medicare HMO | Admitting: General Practice

## 2021-08-23 ENCOUNTER — Encounter: Payer: Self-pay | Admitting: General Practice

## 2021-08-23 ENCOUNTER — Other Ambulatory Visit: Payer: Self-pay

## 2021-08-23 VITALS — BP 128/70 | HR 71 | Ht 69.0 in | Wt 187.4 lb

## 2021-08-23 DIAGNOSIS — I5043 Acute on chronic combined systolic (congestive) and diastolic (congestive) heart failure: Secondary | ICD-10-CM

## 2021-08-23 DIAGNOSIS — N1832 Chronic kidney disease, stage 3b: Secondary | ICD-10-CM

## 2021-08-23 DIAGNOSIS — I10 Essential (primary) hypertension: Secondary | ICD-10-CM | POA: Diagnosis not present

## 2021-08-23 NOTE — Patient Instructions (Signed)
Medication Instructions:  The current medical regimen is effective;  continue present plan and medications as directed. Please refer to the Current Medication list given to you today.   *If you need a refill on your cardiac medications before your next appointment, please call your pharmacy*  Lab Work:   Testing/Procedures:  NONE    NONE  Special Instructions PLEASE READ AND FOLLOW SALTY 6-ATTACHED-1,'800mg'$  daily  PLEASE INCREASE PHYSICAL ACTIVITY AS TOLERATED  TAKE AND LOG YOUR BLOOD PRESSURE  Follow-Up: Your next appointment:  3-4 month(s) In Person with Oswaldo Milian, MD OR IF UNAVAILABLE JESSE CLEAVER, FNP-C Please call our office 2 months in advance to schedule this appointment   At [facility], you and your health needs are our priority.  As part of our continuing mission to provide you with exceptional heart care, we have created designated Provider Care Teams.  These Care Teams include your primary Cardiologist (physician) and Advanced Practice Providers (APPs -  Physician Assistants and Nurse Practitioners) who all work together to provide you with the care you need, when you need it.  We recommend signing up for the patient portal called "MyChart".  Sign up information is provided on this After Visit Summary.  MyChart is used to connect with patients for Virtual Visits (Telemedicine).  Patients are able to view lab/test results, encounter notes, upcoming appointments, etc.  Non-urgent messages can be sent to your provider as well.   To learn more about what you can do with MyChart, go to [email or web address].              6 SALTY THINGS TO AVOID     1,'800MG'$  DAILY

## 2021-08-31 DIAGNOSIS — J9611 Chronic respiratory failure with hypoxia: Secondary | ICD-10-CM | POA: Diagnosis not present

## 2021-09-01 ENCOUNTER — Other Ambulatory Visit: Payer: Self-pay | Admitting: *Deleted

## 2021-09-01 ENCOUNTER — Ambulatory Visit: Payer: HMO | Admitting: Endocrinology

## 2021-09-01 DIAGNOSIS — R7989 Other specified abnormal findings of blood chemistry: Secondary | ICD-10-CM

## 2021-09-01 DIAGNOSIS — N1832 Chronic kidney disease, stage 3b: Secondary | ICD-10-CM

## 2021-09-01 DIAGNOSIS — I5043 Acute on chronic combined systolic (congestive) and diastolic (congestive) heart failure: Secondary | ICD-10-CM

## 2021-09-01 DIAGNOSIS — S51812A Laceration without foreign body of left forearm, initial encounter: Secondary | ICD-10-CM | POA: Diagnosis not present

## 2021-09-01 DIAGNOSIS — B029 Zoster without complications: Secondary | ICD-10-CM | POA: Diagnosis not present

## 2021-09-01 DIAGNOSIS — Z79899 Other long term (current) drug therapy: Secondary | ICD-10-CM

## 2021-09-01 DIAGNOSIS — L089 Local infection of the skin and subcutaneous tissue, unspecified: Secondary | ICD-10-CM | POA: Diagnosis not present

## 2021-09-02 DIAGNOSIS — Z79899 Other long term (current) drug therapy: Secondary | ICD-10-CM | POA: Diagnosis not present

## 2021-09-02 DIAGNOSIS — G4733 Obstructive sleep apnea (adult) (pediatric): Secondary | ICD-10-CM | POA: Diagnosis not present

## 2021-09-02 DIAGNOSIS — N1832 Chronic kidney disease, stage 3b: Secondary | ICD-10-CM | POA: Diagnosis not present

## 2021-09-02 DIAGNOSIS — R7989 Other specified abnormal findings of blood chemistry: Secondary | ICD-10-CM | POA: Diagnosis not present

## 2021-09-02 DIAGNOSIS — I5043 Acute on chronic combined systolic (congestive) and diastolic (congestive) heart failure: Secondary | ICD-10-CM | POA: Diagnosis not present

## 2021-09-02 LAB — BASIC METABOLIC PANEL
BUN/Creatinine Ratio: 10 (ref 10–24)
BUN: 23 mg/dL (ref 8–27)
CO2: 24 mmol/L (ref 20–29)
Calcium: 9.8 mg/dL (ref 8.6–10.2)
Chloride: 99 mmol/L (ref 96–106)
Creatinine, Ser: 2.27 mg/dL — ABNORMAL HIGH (ref 0.76–1.27)
Glucose: 106 mg/dL — ABNORMAL HIGH (ref 65–99)
Potassium: 4.1 mmol/L (ref 3.5–5.2)
Sodium: 141 mmol/L (ref 134–144)
eGFR: 29 mL/min/{1.73_m2} — ABNORMAL LOW (ref >59)

## 2021-09-03 LAB — MAGNESIUM: Magnesium: 2 mg/dL (ref 1.6–2.3)

## 2021-09-03 LAB — T4, FREE: Free T4: 1.34 ng/dL (ref 0.82–1.77)

## 2021-09-04 DIAGNOSIS — I5021 Acute systolic (congestive) heart failure: Secondary | ICD-10-CM | POA: Diagnosis not present

## 2021-09-04 DIAGNOSIS — N183 Chronic kidney disease, stage 3 unspecified: Secondary | ICD-10-CM | POA: Diagnosis not present

## 2021-09-07 DIAGNOSIS — J449 Chronic obstructive pulmonary disease, unspecified: Secondary | ICD-10-CM | POA: Diagnosis not present

## 2021-09-07 DIAGNOSIS — I25118 Atherosclerotic heart disease of native coronary artery with other forms of angina pectoris: Secondary | ICD-10-CM | POA: Diagnosis not present

## 2021-09-07 DIAGNOSIS — G4733 Obstructive sleep apnea (adult) (pediatric): Secondary | ICD-10-CM | POA: Diagnosis not present

## 2021-09-07 DIAGNOSIS — F0150 Vascular dementia without behavioral disturbance: Secondary | ICD-10-CM | POA: Diagnosis not present

## 2021-09-07 DIAGNOSIS — F17211 Nicotine dependence, cigarettes, in remission: Secondary | ICD-10-CM | POA: Diagnosis not present

## 2021-09-07 DIAGNOSIS — D689 Coagulation defect, unspecified: Secondary | ICD-10-CM | POA: Diagnosis not present

## 2021-09-07 DIAGNOSIS — I10 Essential (primary) hypertension: Secondary | ICD-10-CM | POA: Diagnosis not present

## 2021-09-07 DIAGNOSIS — E785 Hyperlipidemia, unspecified: Secondary | ICD-10-CM | POA: Diagnosis not present

## 2021-09-07 DIAGNOSIS — F0151 Vascular dementia with behavioral disturbance: Secondary | ICD-10-CM | POA: Diagnosis not present

## 2021-09-15 DIAGNOSIS — N184 Chronic kidney disease, stage 4 (severe): Secondary | ICD-10-CM | POA: Diagnosis not present

## 2021-09-22 DIAGNOSIS — N184 Chronic kidney disease, stage 4 (severe): Secondary | ICD-10-CM | POA: Diagnosis not present

## 2021-09-22 DIAGNOSIS — I129 Hypertensive chronic kidney disease with stage 1 through stage 4 chronic kidney disease, or unspecified chronic kidney disease: Secondary | ICD-10-CM | POA: Diagnosis not present

## 2021-09-22 DIAGNOSIS — D649 Anemia, unspecified: Secondary | ICD-10-CM | POA: Diagnosis not present

## 2021-10-07 ENCOUNTER — Telehealth: Payer: Self-pay | Admitting: Cardiology

## 2021-10-07 ENCOUNTER — Other Ambulatory Visit: Payer: Self-pay

## 2021-10-07 MED ORDER — TORSEMIDE 40 MG PO TABS: 80.0000 mg | ORAL_TABLET | Freq: Two times a day (BID) | ORAL | 4 refills | Status: DC

## 2021-10-07 NOTE — Telephone Encounter (Signed)
Pt c/o medication issue:  1. Name of Medication: torsemide 40 MG TABS  2. How are you currently taking this medication (dosage and times per day)? PT HAVE BEEN TAKING 8 PILLS PER DAY  3. Are you having a reaction (difficulty breathing--STAT)? NO  4. What is your medication issue? PT IS UNSURE IF HE SHOULD STILL BE TAKING 8 PILLS PER DAY OR JUST 2 PER DAY

## 2021-10-07 NOTE — Telephone Encounter (Signed)
Called pt's wife clarified the instructions of the medication. Refill sent to pharmacy.

## 2021-10-07 NOTE — Telephone Encounter (Signed)
Refill sent to pharmacy.   

## 2021-10-11 MED ORDER — TORSEMIDE 40 MG PO TABS: 80.0000 mg | ORAL_TABLET | Freq: Two times a day (BID) | ORAL | 3 refills | Status: DC

## 2021-10-11 NOTE — Telephone Encounter (Signed)
Wife called stating [facility] haven't received pt's torsemide prescription. Informed new Rx sent.

## 2021-10-11 NOTE — Telephone Encounter (Signed)
Spouse called stating the script still hasn't bee received by [facility] on [address].

## 2021-10-12 ENCOUNTER — Telehealth: Payer: Self-pay | Admitting: Cardiology

## 2021-10-12 DIAGNOSIS — I10 Essential (primary) hypertension: Secondary | ICD-10-CM

## 2021-10-12 DIAGNOSIS — N1832 Chronic kidney disease, stage 3b: Secondary | ICD-10-CM

## 2021-10-12 DIAGNOSIS — I5043 Acute on chronic combined systolic (congestive) and diastolic (congestive) heart failure: Secondary | ICD-10-CM

## 2021-10-12 DIAGNOSIS — Z79899 Other long term (current) drug therapy: Secondary | ICD-10-CM

## 2021-10-12 NOTE — Telephone Encounter (Signed)
Can we do the prior authorization?  In the meantime would stick with the 20 mg tablets.  Has his weight been staying stable on this regimen?  Would recommend also checking BMET/magnesium

## 2021-10-12 NOTE — Telephone Encounter (Signed)
Returned the call to the patient's wife. She stated that they have been having a hard time picking up the Torsemide 80 bid. She stated that her pharmacy said that they will need a prior auth for the 40 mg tablet (2 tablets twice daily).  The patient does have enough to last a few days.

## 2021-10-12 NOTE — Telephone Encounter (Signed)
Pt c/o medication issue:  1. Name of Medication: Torsemide 40 MG TABS  2. How are you currently taking this medication (dosage and times per day)? Patient taking 80 mg total daily  3. Are you having a reaction (difficulty breathing--STAT)?   4. What is your medication issue? Medication requires a prior authorization   The patient's wife has had a hard time getting this medication refilled. The patient called the pharmacy and was told that this medication does not come in a generic version at this dose and requires a prior authorization for the patient's insurance.  Please let the wife know if there is anything she needs to do to get the medication filled

## 2021-10-13 MED ORDER — TORSEMIDE 20 MG PO TABS: 80.0000 mg | ORAL_TABLET | Freq: Two times a day (BID) | ORAL | 2 refills | Status: DC

## 2021-10-13 NOTE — Telephone Encounter (Signed)
Spoke to wife-states he has been taking a 20 mg tablet (4 tablets) two times daily to equal 80 mg BID.   Advised will send new rx for generic tablet as he has been taking.   Weights are stable and patient is doing well per wife.   Aware of lab work recommendations as well.     Rx updated.

## 2021-10-13 NOTE — Telephone Encounter (Signed)
**Note De-Identified  Obfuscation** Generic Torsemide does not come in a 40 mg tablet only name brand Demadex does.  The available doses in generic Torsemide is: 5 mg, 10 mg, 20 mg, and 100 mg.  Will forward to Dr Gardiner Rhyme and his nurse for advisement.

## 2021-10-15 ENCOUNTER — Ambulatory Visit (INDEPENDENT_AMBULATORY_CARE_PROVIDER_SITE_OTHER): Payer: Medicare HMO

## 2021-10-15 DIAGNOSIS — I255 Ischemic cardiomyopathy: Secondary | ICD-10-CM | POA: Diagnosis not present

## 2021-10-17 LAB — CUP PACEART REMOTE DEVICE CHECK
Battery Remaining Longevity: 47 mo
Battery Remaining Percentage: 52 %
Battery Voltage: 2.93 V
Brady Statistic AP VP Percent: 1 %
Brady Statistic AP VS Percent: 29 %
Brady Statistic AS VP Percent: 1 %
Brady Statistic AS VS Percent: 70 %
Brady Statistic RA Percent Paced: 26 %
Brady Statistic RV Percent Paced: 1 %
Date Time Interrogation Session: 20221021040014
HighPow Impedance: 78 Ohm
HighPow Impedance: 78 Ohm
Implantable Lead Implant Date: 20140217
Implantable Lead Implant Date: 20190104
Implantable Lead Location: 753859
Implantable Lead Location: 753860
Implantable Lead Model: 7122
Implantable Pulse Generator Implant Date: 20190104
Lead Channel Impedance Value: 330 Ohm
Lead Channel Impedance Value: 740 Ohm
Lead Channel Pacing Threshold Amplitude: 1 V
Lead Channel Pacing Threshold Amplitude: 1.25 V
Lead Channel Pacing Threshold Pulse Width: 0.5 ms
Lead Channel Pacing Threshold Pulse Width: 0.8 ms
Lead Channel Sensing Intrinsic Amplitude: 0.9 mV
Lead Channel Sensing Intrinsic Amplitude: 12 mV
Lead Channel Setting Pacing Amplitude: 2.5 V
Lead Channel Setting Pacing Amplitude: 2.5 V
Lead Channel Setting Pacing Pulse Width: 0.5 ms
Lead Channel Setting Sensing Sensitivity: 0.5 mV
Pulse Gen Serial Number: 9777027

## 2021-10-25 NOTE — Progress Notes (Signed)
Remote ICD transmission.   

## 2021-10-27 LAB — BASIC METABOLIC PANEL
BUN/Creatinine Ratio: 9 — ABNORMAL LOW (ref 10–24)
BUN: 22 mg/dL (ref 8–27)
CO2: 25 mmol/L (ref 20–29)
Calcium: 9.8 mg/dL (ref 8.6–10.2)
Chloride: 98 mmol/L (ref 96–106)
Creatinine, Ser: 2.35 mg/dL — ABNORMAL HIGH (ref 0.76–1.27)
Glucose: 106 mg/dL — ABNORMAL HIGH (ref 70–99)
Potassium: 4.3 mmol/L (ref 3.5–5.2)
Sodium: 138 mmol/L (ref 134–144)
eGFR: 28 mL/min/{1.73_m2} — ABNORMAL LOW (ref >59)

## 2021-10-27 LAB — MAGNESIUM: Magnesium: 1.9 mg/dL (ref 1.6–2.3)

## 2021-10-31 NOTE — Progress Notes (Signed)
Cardiology Office Note:    Date:  11/02/2021   ID:  Joe Wiggins, DOB 09/19/1946, MRN [id number]  PCP:  Ezequiel Essex, MD  Cardiologist:  Donato Heinz, MD  Electrophysiologist:  Cristopher Peru, MD   Referring MD: Ezequiel Essex, MD   Chief Complaint  Patient presents with   Congestive Heart Failure      History of Present Illness:    Joe Wiggins is a 75 y.o. male with a hx of ischemic cardiomyopathy status post ICD, atrial fibrillation on Eliquis and amiodarone, COPD, OSA, CVA, hypertension, hyperlipidemia, CKD stage IIIb who presents for follow-up.  He was admitted from 1/2 through 12/29/2020 after presenting with worsening shortness of breath.  He also had a fall.  Etiology of dyspnea felt to be secondary to COPD and heart failure exacerbation.  He was treated with prednisone and doxycycline and received diuresis.  Echocardiogram on 12/28/2020 showed LVEF 45 to 50%, septal hypokinesis and dyssynchrony consistent with RV pacing, concentric LVH, grade 2 diastolic dysfunction.  Since last clinic visit, he reports that he has been doing okay.  Weight has been stable on torsemide.  Denies any chest pain, syncope, lower extremity edema, or palpitations.  Reports occasional dyspnea.  Also occasional episodes of lightheadedness.  Denies any bleeding issues.  Reports BP has been 120s 130s at home.    Wt Readings from Last 3 Encounters:  11/01/21 184 lb 12.8 oz (83.8 kg)  08/23/21 187 lb 6.4 oz (85 kg)  08/19/21 188 lb (85.3 kg)   BP Readings from Last 3 Encounters:  11/01/21 138/60  08/23/21 128/70  08/19/21 (!) 209/80     Past Medical History:  Diagnosis Date   Abrasion of forearm, right 07/27/2019   Acute respiratory failure (HCC)    AF (paroxysmal atrial fibrillation) (HCC)    AICD (automatic cardioverter/defibrillator) present    CAD (coronary artery disease)    Cardiomyopathy, ischemic    Carotid artery occlusion    Chronic kidney disease    COPD (chronic obstructive  pulmonary disease) ([place])    Cough 11/13/2019   Dementia without behavioral disturbance (HCC)    Dizziness 01/17/2016   Facial rash 08/15/2018   GERD (gastroesophageal reflux disease)    Hyperlipidemia    Hypertension    Pre-syncope 01/16/2018   Respiratory distress 12/27/2020   Sleep apnea    Stroke ([place])    TIA (transient ischemic attack)     Past Surgical History:  Procedure Laterality Date   CARDIAC PACEMAKER PLACEMENT     CATARACT EXTRACTION     CORONARY ARTERY BYPASS GRAFT     CORONARY STENT PLACEMENT     EYE SURGERY     ICD IMPLANT N/A 12/29/2017   Procedure: ICD IMPLANT;  Surgeon: Evans Lance, MD;  Location: [place] [facility];  Service: Cardiovascular;  Laterality: N/A;   LEFT HEART CATH AND CORS/GRAFTS ANGIOGRAPHY N/A 12/29/2017   Procedure: LEFT HEART CATH AND CORS/GRAFTS ANGIOGRAPHY;  Surgeon: Martinique, Peter M, MD;  Location: [place] [facility];  Service: Cardiovascular;  Laterality: N/A;    Current Medications: Current Meds  Medication Sig   albuterol (ACCUNEB) 0.63 MG/3ML nebulizer solution Take 3 mLs by nebulization 4 (four) times daily as needed for shortness of breath or wheezing. 3 ml inhale orally via nebulizer four times a day   albuterol (PROVENTIL HFA;VENTOLIN HFA) 108 (90 Base) MCG/ACT inhaler Inhale 2 puffs into the lungs every 6 (six) hours as needed for wheezing or shortness of breath.   ascorbic acid (  VITAMIN C) 500 MG tablet Take 500 mg by mouth daily.   atorvastatin (LIPITOR) 40 MG tablet Take 1 tablet (40 mg total) by mouth daily.   cholecalciferol (VITAMIN D) 1000 units tablet Take 1,000 Units by mouth daily.   Cyanocobalamin (VITAMIN B 12 PO) Take 1 tablet by mouth daily.   ferrous sulfate 325 (65 FE) MG tablet Take 325 mg by mouth daily with breakfast.   folic acid (FOLVITE) 892 MCG tablet Take 400 mcg by mouth daily.   labetalol (NORMODYNE) 300 MG tablet Take 1 tablet (300 mg total) by mouth 2 (two) times daily.   Multiple Vitamin (MULTIVITAMIN)  tablet Take 1 tablet by mouth daily.   nitroGLYCERIN (NITROSTAT) 0.4 MG SL tablet Place 1 tablet (0.4 mg total) under the tongue every 5 (five) minutes as needed for chest pain.   torsemide (DEMADEX) 20 MG tablet Take 4 tablets (80 mg total) by mouth 2 (two) times daily.   [DISCONTINUED] apixaban (ELIQUIS) 5 MG TABS tablet Take 1 tablet (5 mg total) by mouth 2 (two) times daily.     Allergies:   Ipratropium, Metoprolol, Morphine and related, Vicodin [hydrocodone-acetaminophen], and Penicillins   Social History   Socioeconomic History   Marital status: Married    Spouse name: Not on file   Number of children: Not on file   Years of education: Not on file   Highest education level: Not on file  Occupational History   Not on file  Tobacco Use   Smoking status: Former    Types: Cigarettes    Quit date: 11/30/2012    Years since quitting: 8.9   Smokeless tobacco: Never  Vaping Use   Vaping Use: Never used  Substance and Sexual Activity   Alcohol use: No   Drug use: No   Sexual activity: Not on file  Other Topics Concern   Not on file  Social History Narrative   Not on file   Social Determinants of Health   Financial Resource Strain: Not on file  Food Insecurity: Not on file  Transportation Needs: Not on file  Physical Activity: Not on file  Stress: Not on file  Social Connections: Not on file     Family History: The patient's family history includes Heart disease in his mother and sister; Hypertension in his mother and sister; Thyroid disease in his brother and sister.  ROS:   Please see the history of present illness.    All other systems reviewed and are negative.  EKGs/Labs/Other Studies Reviewed:    The following studies were reviewed today:   EKG:  EKG is ordered today and shows sinus rhythm, left bundle branch block, rate 83  Recent Labs: 08/19/2021: ALT 12; BNP 542.8; Hemoglobin 10.5; Platelets 294; TSH 0.311 10/27/2021: BUN 22; Creatinine, Ser 2.35;  Magnesium 1.9; Potassium 4.3; Sodium 138  Recent Lipid Panel    Component Value Date/Time   CHOL 104 01/06/2021 1203   TRIG 65 01/06/2021 1203   HDL 54 01/06/2021 1203   CHOLHDL 1.9 01/06/2021 1203   CHOLHDL 4.6 12/26/2017 0845   VLDL 33 12/26/2017 0845   LDLCALC 36 01/06/2021 1203    Physical Exam:    VS:  BP 138/60   Pulse 70   Ht 5\' 9"  (1.753 m)   Wt 184 lb 12.8 oz (83.8 kg)   SpO2 93%   BMI 27.29 kg/m     Wt Readings from Last 3 Encounters:  11/01/21 184 lb 12.8 oz (83.8 kg)  08/23/21 187 lb  6.4 oz (85 kg)  08/19/21 188 lb (85.3 kg)     GEN:  in no acute distress HEENT: Normal NECK: No JVD, b/l carotid bruits CARDIAC: RRR, 2 out of 6 systolic murmur RESPIRATORY: CTAB ABDOMEN: Soft, non-tender, non-distended MUSCULOSKELETAL: noedema SKIN: Warm and dry NEUROLOGIC:  Alert and oriented x 3 PSYCHIATRIC:  Normal affect   ASSESSMENT:    1. Chronic combined systolic and diastolic heart failure ([place])   2. Bilateral carotid bruits   3. Paroxysmal atrial fibrillation (HCC)   4. Coronary artery disease involving native coronary artery of native heart without angina pectoris   5. Essential hypertension   6. Stage 3b chronic kidney disease ([place])   7. Hyperlipidemia, unspecified hyperlipidemia type      PLAN:    Chronic combined systolic and diastolic heart failure: Status post CABG (LIMA-LAD, SVG-D1, SVG-OM, SVG-PDA as sequential graft) on 06/18/2013.  Most recent cath 12/29/2017 showed severe three-vessel occlusive CAD, patent LIMA-LAD, patent sequential SVG- OM and PDA, patent Y graft SVG-diagonal.  Echocardiogram 12/28/2020 with EF 45 to 75%, grade 2 diastolic dysfunction, septal hypokinesis and dyssynchrony consistent with RV pacing.  Status post AICD placement -Follows with the EP for ICD management -Continue torsemide 80 mg BID.  Appears euvolemic.  Advised to continue to monitor daily weights and call if gains more than 3 pounds 1 day or 5 pounds 1 week.  Had labs  checked last week, stable creatinine and electrolytes.   -Will repeat echo at next clinic visit  Atrial fibrillation: CHA2DS2-VASc score 7 (CHF, hypertension, age x2, CVA x2, CAD) -Continue Eliquis -Continue labetalol -Discontinued amiodarone due to hyperthyroidism -Follows with EP  CAD: Status post CABG as above.  Continue Eliquis and statin.  Hypertension: On labetalol 300 mg twice daily.   CKD stage IIIb: Baseline creatinine around 2.2, was up to 3.0 on admission on 12/27/20, was 2.6 on discharge.  Follows with nephrology.  Creatinine 2.35 on 10/27/2021  Hyperlipidemia: Continue atorvastatin 40 mg daily.  LDL 36 on 01/06/2021  Hyperthyroidism: Labs on 01/06/2021 showed TSH 0.011, free T4 2.19, consistent with hyperthyroidism.  Discussed with Dr. Lovena Le, discontinued amiodarone.  Referred to endocrinology.  He was seen by Dr. Loanne Drilling.  Hyperthyroidism resolved off amiodarone.  OSA: On CPAP, reports compliance  Carotid bruits: Bilateral carotid bruits noted on exam, will check carotid duplex  RTC in 6 months   Medication Adjustments/Labs and Tests Ordered: Current medicines are reviewed at length with the patient today.  Concerns regarding medicines are outlined above.  Orders Placed This Encounter  Procedures   ECHOCARDIOGRAM COMPLETE   VAS US CAROTID     No orders of the defined types were placed in this encounter.    Patient Instructions  Medication Instructions:  Your physician recommends that you continue on your current medications as directed. Please refer to the Current Medication list given to you today.  *If you need a refill on your cardiac medications before your next appointment, please call your pharmacy*  Testing/Procedures: Your physician has requested that you have a carotid duplex. This test is an ultrasound of the carotid arteries in your neck. It looks at blood flow through these arteries that supply the brain with blood. Allow one hour for this exam.  There are no restrictions or special instructions.  Your physician has requested that you have an echocardiogram (IN MAY 2023). Echocardiography is a painless test that uses sound waves to create images of your heart. It provides your doctor with information about the size and  shape of your heart and how well your heart's chambers and valves are working. This procedure takes approximately one hour. There are no restrictions for this procedure.   Follow-Up: At [facility], you and your health needs are our priority.  As part of our continuing mission to provide you with exceptional heart care, we have created designated Provider Care Teams.  These Care Teams include your primary Cardiologist (physician) and Advanced Practice Providers (APPs -  Physician Assistants and Nurse Practitioners) who all work together to provide you with the care you need, when you need it.  We recommend signing up for the patient portal called "MyChart".  Sign up information is provided on this After Visit Summary.  MyChart is used to connect with patients for Virtual Visits (Telemedicine).  Patients are able to view lab/test results, encounter notes, upcoming appointments, etc.  Non-urgent messages can be sent to your provider as well.   To learn more about what you can do with MyChart, go to [email or web address].    Your next appointment:   6 month(s)  The format for your next appointment:   In Person  Provider:   Donato Heinz, MD {   Signed, Donato Heinz, MD  11/02/2021 11:14 PM    [place]

## 2021-11-01 ENCOUNTER — Other Ambulatory Visit: Payer: Self-pay | Admitting: Internal Medicine

## 2021-11-01 ENCOUNTER — Other Ambulatory Visit: Payer: Self-pay

## 2021-11-01 ENCOUNTER — Ambulatory Visit: Payer: Medicare HMO | Admitting: Cardiology

## 2021-11-01 ENCOUNTER — Encounter: Payer: Self-pay | Admitting: Cardiology

## 2021-11-01 VITALS — BP 138/60 | HR 70 | Ht 69.0 in | Wt 184.8 lb

## 2021-11-01 DIAGNOSIS — R0989 Other specified symptoms and signs involving the circulatory and respiratory systems: Secondary | ICD-10-CM

## 2021-11-01 DIAGNOSIS — E785 Hyperlipidemia, unspecified: Secondary | ICD-10-CM

## 2021-11-01 DIAGNOSIS — I480 Paroxysmal atrial fibrillation: Secondary | ICD-10-CM

## 2021-11-01 DIAGNOSIS — I5042 Chronic combined systolic (congestive) and diastolic (congestive) heart failure: Secondary | ICD-10-CM | POA: Diagnosis not present

## 2021-11-01 DIAGNOSIS — I2510 Atherosclerotic heart disease of native coronary artery without angina pectoris: Secondary | ICD-10-CM | POA: Diagnosis not present

## 2021-11-01 DIAGNOSIS — N1832 Chronic kidney disease, stage 3b: Secondary | ICD-10-CM

## 2021-11-01 DIAGNOSIS — I10 Essential (primary) hypertension: Secondary | ICD-10-CM

## 2021-11-01 NOTE — Patient Instructions (Signed)
Medication Instructions:  Your physician recommends that you continue on your current medications as directed. Please refer to the Current Medication list given to you today.  *If you need a refill on your cardiac medications before your next appointment, please call your pharmacy*  Testing/Procedures: Your physician has requested that you have a carotid duplex. This test is an ultrasound of the carotid arteries in your neck. It looks at blood flow through these arteries that supply the brain with blood. Allow one hour for this exam. There are no restrictions or special instructions.  Your physician has requested that you have an echocardiogram (IN MAY 2023). Echocardiography is a painless test that uses sound waves to create images of your heart. It provides your doctor with information about the size and shape of your heart and how well your heart's chambers and valves are working. This procedure takes approximately one hour. There are no restrictions for this procedure.   Follow-Up: At [facility], you and your health needs are our priority.  As part of our continuing mission to provide you with exceptional heart care, we have created designated Provider Care Teams.  These Care Teams include your primary Cardiologist (physician) and Advanced Practice Providers (APPs -  Physician Assistants and Nurse Practitioners) who all work together to provide you with the care you need, when you need it.  We recommend signing up for the patient portal called "MyChart".  Sign up information is provided on this After Visit Summary.  MyChart is used to connect with patients for Virtual Visits (Telemedicine).  Patients are able to view lab/test results, encounter notes, upcoming appointments, etc.  Non-urgent messages can be sent to your provider as well.   To learn more about what you can do with MyChart, go to [email or web address].    Your next appointment:   6 month(s)  The format for your next  appointment:   In Person  Provider:   Donato Heinz, MD {

## 2021-11-01 NOTE — Telephone Encounter (Signed)
Prescription refill request for Eliquis received. Indication:Afib Last office visit:11/22 Scr:2.3 Age: 75 Weight:83.8 kg  Prescription refilled

## 2021-11-10 ENCOUNTER — Ambulatory Visit (HOSPITAL_COMMUNITY)
Admission: RE | Admit: 2021-11-10 | Discharge: 2021-11-10 | Disposition: A | Discharge status: Home / Self Care | Payer: Medicare HMO | Source: Ambulatory Visit | Attending: Cardiovascular Disease | Admitting: Cardiovascular Disease

## 2021-11-10 ENCOUNTER — Other Ambulatory Visit: Payer: Self-pay

## 2021-11-10 DIAGNOSIS — R0989 Other specified symptoms and signs involving the circulatory and respiratory systems: Secondary | ICD-10-CM | POA: Diagnosis not present

## 2021-11-12 ENCOUNTER — Other Ambulatory Visit: Payer: Self-pay | Admitting: *Deleted

## 2021-11-12 DIAGNOSIS — I779 Disorder of arteries and arterioles, unspecified: Secondary | ICD-10-CM

## 2021-11-24 ENCOUNTER — Other Ambulatory Visit: Payer: Self-pay

## 2021-11-24 ENCOUNTER — Ambulatory Visit: Payer: Medicare HMO | Admitting: Physician Assistant

## 2021-11-24 VITALS — BP 141/66 | HR 70 | Temp 97.8°F | Resp 20 | Ht 69.0 in | Wt 184.0 lb

## 2021-11-24 DIAGNOSIS — I6521 Occlusion and stenosis of right carotid artery: Secondary | ICD-10-CM | POA: Diagnosis not present

## 2021-11-24 DIAGNOSIS — I6523 Occlusion and stenosis of bilateral carotid arteries: Secondary | ICD-10-CM

## 2021-11-24 DIAGNOSIS — I6522 Occlusion and stenosis of left carotid artery: Secondary | ICD-10-CM

## 2021-11-24 NOTE — Progress Notes (Signed)
HISTORY AND PHYSICAL     CC:  follow up. Requesting Provider:  Ezequiel Essex, MD  HPI: This is a 75 y.o. male here for follow up for carotid artery stenosis.  Pt is s/p left carotid stenting in 2013 by Dr. Maryjean Morn.  He suffered a stroke on POD 1.  He has a known right carotid artery occlusion.  He established care here in 2016 with Dr. Donnetta Hutching for surveillance.      Pt was last seen here in 2020 and at that time he was having some mild neurological deficits that included some memory issues, tingling in the left hand and foot and a slight left foot drop.  He did not have any other neurological deficits.  In 2019, he was referred for thyroid nodule and no intervention was needed per the wife.   He has a past medical history of paroxysmal atrial fibrillation managed on Eliquis. He has CAD s/p CABG. He has hyperlipidemia managed on a statin and hypertension.  Pt returns today for follow up.    Pt denies any amaurosis fugax, speech difficulties, weakness, numbness, paralysis or clumsiness or facial droop.   He continues to take Eliquis for PAF.  He continues to take his statin.  He walks about a mile every day.   The pt is on a statin for cholesterol management.  The pt is not on a daily aspirin.   Other AC:  Eliquis The pt is on BB, diuretic for hypertension.   The pt is not diabetic.   Tobacco hx:  former   Past Medical History:  Diagnosis Date   Abrasion of forearm, right 07/27/2019   Acute respiratory failure (HCC)    AF (paroxysmal atrial fibrillation) (HCC)    AICD (automatic cardioverter/defibrillator) present    CAD (coronary artery disease)    Cardiomyopathy, ischemic    Carotid artery occlusion    Chronic kidney disease    COPD (chronic obstructive pulmonary disease) ([place])    Cough 11/13/2019   Dementia without behavioral disturbance (HCC)    Dizziness 01/17/2016   Facial rash 08/15/2018   GERD (gastroesophageal reflux disease)    Hyperlipidemia    Hypertension     Pre-syncope 01/16/2018   Respiratory distress 12/27/2020   Sleep apnea    Stroke ([place])    TIA (transient ischemic attack)     Past Surgical History:  Procedure Laterality Date   CARDIAC PACEMAKER PLACEMENT     CATARACT EXTRACTION     CORONARY ARTERY BYPASS GRAFT     CORONARY STENT PLACEMENT     EYE SURGERY     ICD IMPLANT N/A 12/29/2017   Procedure: ICD IMPLANT;  Surgeon: Evans Lance, MD;  Location: [place] [facility];  Service: Cardiovascular;  Laterality: N/A;   LEFT HEART CATH AND CORS/GRAFTS ANGIOGRAPHY N/A 12/29/2017   Procedure: LEFT HEART CATH AND CORS/GRAFTS ANGIOGRAPHY;  Surgeon: Martinique, Peter M, MD;  Location: [place] [facility];  Service: Cardiovascular;  Laterality: N/A;    Allergies  Allergen Reactions   Ipratropium Other (See Comments)   Metoprolol     Was bringing blood pressure too low and he would pass out.   Morphine And Related Nausea Only   Vicodin [Hydrocodone-Acetaminophen] Other (See Comments)    Hallucinations    Penicillins Rash    Current Outpatient Medications  Medication Sig Dispense Refill   albuterol (ACCUNEB) 0.63 MG/3ML nebulizer solution Take 3 mLs by nebulization 4 (four) times daily as needed for shortness of breath or wheezing. 3  ml inhale orally via nebulizer four times a day     albuterol (PROVENTIL HFA;VENTOLIN HFA) 108 (90 Base) MCG/ACT inhaler Inhale 2 puffs into the lungs every 6 (six) hours as needed for wheezing or shortness of breath.     ascorbic acid (VITAMIN C) 500 MG tablet Take 500 mg by mouth daily.     atorvastatin (LIPITOR) 40 MG tablet Take 1 tablet (40 mg total) by mouth daily. 90 tablet 2   cholecalciferol (VITAMIN D) 1000 units tablet Take 1,000 Units by mouth daily.     Cyanocobalamin (VITAMIN B 12 PO) Take 1 tablet by mouth daily.     ELIQUIS 5 MG TABS tablet TAKE 1 TABLET TWICE DAILY 180 tablet 1   ferrous sulfate 325 (65 FE) MG tablet Take 325 mg by mouth daily with breakfast.     folic acid (FOLVITE) 867 MCG tablet  Take 400 mcg by mouth daily.     labetalol (NORMODYNE) 300 MG tablet Take 1 tablet (300 mg total) by mouth 2 (two) times daily. 180 tablet 2   Multiple Vitamin (MULTIVITAMIN) tablet Take 1 tablet by mouth daily.     nitroGLYCERIN (NITROSTAT) 0.4 MG SL tablet Place 1 tablet (0.4 mg total) under the tongue every 5 (five) minutes as needed for chest pain. 75 tablet 2   torsemide (DEMADEX) 20 MG tablet Take 4 tablets (80 mg total) by mouth 2 (two) times daily. 720 tablet 2   No current facility-administered medications for this visit.    Family History  Problem Relation Age of Onset   Hypertension Mother    Heart disease Mother    Hypertension Sister    Heart disease Sister    Thyroid disease Sister    Thyroid disease Brother        hyperthyroid    Social History   Socioeconomic History   Marital status: Married    Spouse name: Not on file   Number of children: Not on file   Years of education: Not on file   Highest education level: Not on file  Occupational History   Not on file  Tobacco Use   Smoking status: Former    Types: Cigarettes    Quit date: 11/30/2012    Years since quitting: 8.9   Smokeless tobacco: Never  Vaping Use   Vaping Use: Never used  Substance and Sexual Activity   Alcohol use: No   Drug use: No   Sexual activity: Not on file  Other Topics Concern   Not on file  Social History Narrative   Not on file   Social Determinants of Health   Financial Resource Strain: Not on file  Food Insecurity: Not on file  Transportation Needs: Not on file  Physical Activity: Not on file  Stress: Not on file  Social Connections: Not on file  Intimate Partner Violence: Not on file     REVIEW OF SYSTEMS:   [X]  denotes positive finding, [ ]  denotes negative finding Cardiac  Comments:  Chest pain or chest pressure:    Shortness of breath upon exertion:    Short of breath when lying flat:    Irregular heart rhythm:        Vascular    Pain in calf, thigh, or  hip brought on by ambulation:    Pain in feet at night that wakes you up from your sleep:     Blood clot in your veins:    Leg swelling:  Pulmonary    Oxygen at home:    Productive cough:     Wheezing:         Neurologic    Sudden weakness in arms or legs:     Sudden numbness in arms or legs:     Sudden onset of difficulty speaking or slurred speech:    Temporary loss of vision in one eye:     Problems with dizziness:         Gastrointestinal    Blood in stool:     Vomited blood:         Genitourinary    Burning when urinating:     Blood in urine:        Psychiatric    Major depression:         Hematologic    Bleeding problems:    Problems with blood clotting too easily:        Skin    Rashes or ulcers:        Constitutional    Fever or chills:      PHYSICAL EXAMINATION:  Today's Vitals   11/24/21 1309  BP: (!) 144/69  Pulse: 70  Resp: 20  Temp: 97.8 F (36.6 C)  TempSrc: Temporal  SpO2: 95%  Weight: 184 lb (83.5 kg)  Height: 5\' 9"  (1.753 m)   Body mass index is 27.17 kg/m.   General:  WDWN in NAD; vital signs documented above Gait: Not observed HENT: WNL, normocephalic Pulmonary: normal non-labored breathing Cardiac: regular HR, with carotid bruit bilaterally Skin: without rashes Vascular Exam/Pulses:  Right Left  Radial 2+ (normal) 2+ (normal)   Extremities: without ischemic changes, without Gangrene , without cellulitis; without open wounds Musculoskeletal: no muscle wasting or atrophy  Neurologic: A&O X 3; moving all extremities equally; speech is fluent/normal Psychiatric:  The pt has Normal affect.   Non-Invasive Vascular Imaging:   Carotid Duplex on 10/31/2021: Right:  occluded Left:  50-75% ICA stenosis Vertebrals:  Bilateral vertebral arteries demonstrate antegrade flow.  Subclavians: Left subclavian artery was stenotic. Normal flow hemodynamics  were seen in the right subclavian artery.   Previous Carotid duplex on  07/03/2019: Right: occluded Left:   50-75% ICA stent stenosis    ASSESSMENT/PLAN:: 75 y.o. male here for follow up carotid artery stenosis and is s/p left carotid stenting in 2013 by Dr. Maryjean Morn.  -duplex today reveals it is essentially unchanged from previous duplex and he remains asymptomatic.   -discussed s/s of stroke with pt and he understands should he develop any of these sx, he will go to the nearest ER or call 911. -pt will f/u in 6 months with carotid duplex -pt will call sooner should they have any issues. -continue statin and eliquis for his PAF  Leontine Locket, [facility] [phone]  Clinic MD:  Donzetta Matters

## 2021-11-26 ENCOUNTER — Other Ambulatory Visit: Payer: Self-pay

## 2021-11-26 DIAGNOSIS — I6521 Occlusion and stenosis of right carotid artery: Secondary | ICD-10-CM

## 2021-12-29 ENCOUNTER — Ambulatory Visit: Payer: Medicare HMO | Admitting: Cardiology

## 2022-01-14 ENCOUNTER — Ambulatory Visit (INDEPENDENT_AMBULATORY_CARE_PROVIDER_SITE_OTHER): Payer: Medicare HMO

## 2022-01-14 DIAGNOSIS — I255 Ischemic cardiomyopathy: Secondary | ICD-10-CM

## 2022-01-14 LAB — CUP PACEART REMOTE DEVICE CHECK
Battery Remaining Longevity: 44 mo
Battery Remaining Percentage: 49 %
Battery Voltage: 2.93 V
Brady Statistic AP VP Percent: 1 %
Brady Statistic AP VS Percent: 27 %
Brady Statistic AS VP Percent: 1 %
Brady Statistic AS VS Percent: 71 %
Brady Statistic RA Percent Paced: 24 %
Brady Statistic RV Percent Paced: 1 %
Date Time Interrogation Session: 20230120040018
HighPow Impedance: 80 Ohm
HighPow Impedance: 80 Ohm
Implantable Lead Implant Date: 20140217
Implantable Lead Implant Date: 20190104
Implantable Lead Location: 753859
Implantable Lead Location: 753860
Implantable Lead Model: 7122
Implantable Pulse Generator Implant Date: 20190104
Lead Channel Impedance Value: 330 Ohm
Lead Channel Impedance Value: 610 Ohm
Lead Channel Pacing Threshold Amplitude: 1 V
Lead Channel Pacing Threshold Amplitude: 1.25 V
Lead Channel Pacing Threshold Pulse Width: 0.5 ms
Lead Channel Pacing Threshold Pulse Width: 0.8 ms
Lead Channel Sensing Intrinsic Amplitude: 0.6 mV
Lead Channel Sensing Intrinsic Amplitude: 12 mV
Lead Channel Setting Pacing Amplitude: 2.5 V
Lead Channel Setting Pacing Amplitude: 2.5 V
Lead Channel Setting Pacing Pulse Width: 0.5 ms
Lead Channel Setting Sensing Sensitivity: 0.5 mV
Pulse Gen Serial Number: 9777027

## 2022-01-24 ENCOUNTER — Other Ambulatory Visit: Payer: Self-pay | Admitting: Internal Medicine

## 2022-01-26 ENCOUNTER — Telehealth (HOSPITAL_COMMUNITY): Payer: Self-pay | Admitting: *Deleted

## 2022-01-26 NOTE — Telephone Encounter (Signed)
Received referral from Dr. Dorian Pod for this pt to participate in pulmonary with the diagnosis COPD.  Reviewed accompanying documents as well as notes in [place].  Pt sees Dr. Camillo Flaming and last seen in October 2022.  Will need to review Full PFT in order to use the diagnosis of COPD for pulmonary rehab.  Called and gave message to [employer] for Dr. Dorian Pod requesting this document. Await return call. Cherre Huger, BSN Cardiac and Training and development officer

## 2022-01-27 NOTE — Progress Notes (Signed)
Remote ICD transmission.   

## 2022-03-07 ENCOUNTER — Emergency Department (HOSPITAL_COMMUNITY): Payer: Medicare HMO

## 2022-03-07 ENCOUNTER — Encounter (HOSPITAL_COMMUNITY): Payer: Self-pay | Admitting: Emergency Medicine

## 2022-03-07 ENCOUNTER — Inpatient Hospital Stay (HOSPITAL_COMMUNITY): Payer: Medicare HMO

## 2022-03-07 ENCOUNTER — Inpatient Hospital Stay (HOSPITAL_COMMUNITY)
Admission: EM | Admit: 2022-03-07 | Discharge: 2022-03-11 | DRG: 280 | Disposition: A | Discharge status: Home / Self Care | Payer: Medicare HMO | Attending: Internal Medicine | Admitting: Internal Medicine

## 2022-03-07 DIAGNOSIS — I130 Hypertensive heart and chronic kidney disease with heart failure and stage 1 through stage 4 chronic kidney disease, or unspecified chronic kidney disease: Secondary | ICD-10-CM | POA: Diagnosis present

## 2022-03-07 DIAGNOSIS — Z8249 Family history of ischemic heart disease and other diseases of the circulatory system: Secondary | ICD-10-CM

## 2022-03-07 DIAGNOSIS — M109 Gout, unspecified: Secondary | ICD-10-CM | POA: Diagnosis present

## 2022-03-07 DIAGNOSIS — N400 Enlarged prostate without lower urinary tract symptoms: Secondary | ICD-10-CM | POA: Diagnosis present

## 2022-03-07 DIAGNOSIS — Z79899 Other long term (current) drug therapy: Secondary | ICD-10-CM

## 2022-03-07 DIAGNOSIS — Z880 Allergy status to penicillin: Secondary | ICD-10-CM

## 2022-03-07 DIAGNOSIS — R911 Solitary pulmonary nodule: Secondary | ICD-10-CM | POA: Diagnosis present

## 2022-03-07 DIAGNOSIS — Z950 Presence of cardiac pacemaker: Secondary | ICD-10-CM | POA: Diagnosis present

## 2022-03-07 DIAGNOSIS — I255 Ischemic cardiomyopathy: Secondary | ICD-10-CM

## 2022-03-07 DIAGNOSIS — Z881 Allergy status to other antibiotic agents status: Secondary | ICD-10-CM

## 2022-03-07 DIAGNOSIS — E785 Hyperlipidemia, unspecified: Secondary | ICD-10-CM | POA: Diagnosis present

## 2022-03-07 DIAGNOSIS — Z955 Presence of coronary angioplasty implant and graft: Secondary | ICD-10-CM

## 2022-03-07 DIAGNOSIS — I447 Left bundle-branch block, unspecified: Secondary | ICD-10-CM | POA: Diagnosis present

## 2022-03-07 DIAGNOSIS — I5043 Acute on chronic combined systolic (congestive) and diastolic (congestive) heart failure: Secondary | ICD-10-CM

## 2022-03-07 DIAGNOSIS — E0590 Thyrotoxicosis, unspecified without thyrotoxic crisis or storm: Secondary | ICD-10-CM | POA: Diagnosis present

## 2022-03-07 DIAGNOSIS — Z8673 Personal history of transient ischemic attack (TIA), and cerebral infarction without residual deficits: Secondary | ICD-10-CM | POA: Diagnosis not present

## 2022-03-07 DIAGNOSIS — Z7901 Long term (current) use of anticoagulants: Secondary | ICD-10-CM | POA: Diagnosis not present

## 2022-03-07 DIAGNOSIS — Z8349 Family history of other endocrine, nutritional and metabolic diseases: Secondary | ICD-10-CM

## 2022-03-07 DIAGNOSIS — I214 Non-ST elevation (NSTEMI) myocardial infarction: Principal | ICD-10-CM | POA: Diagnosis present

## 2022-03-07 DIAGNOSIS — Z885 Allergy status to narcotic agent status: Secondary | ICD-10-CM

## 2022-03-07 DIAGNOSIS — N184 Chronic kidney disease, stage 4 (severe): Secondary | ICD-10-CM | POA: Diagnosis present

## 2022-03-07 DIAGNOSIS — I472 Ventricular tachycardia, unspecified: Secondary | ICD-10-CM | POA: Diagnosis present

## 2022-03-07 DIAGNOSIS — J449 Chronic obstructive pulmonary disease, unspecified: Secondary | ICD-10-CM | POA: Diagnosis present

## 2022-03-07 DIAGNOSIS — K219 Gastro-esophageal reflux disease without esophagitis: Secondary | ICD-10-CM | POA: Diagnosis present

## 2022-03-07 DIAGNOSIS — R0902 Hypoxemia: Secondary | ICD-10-CM | POA: Diagnosis present

## 2022-03-07 DIAGNOSIS — I480 Paroxysmal atrial fibrillation: Secondary | ICD-10-CM | POA: Diagnosis present

## 2022-03-07 DIAGNOSIS — Z888 Allergy status to other drugs, medicaments and biological substances status: Secondary | ICD-10-CM

## 2022-03-07 DIAGNOSIS — F0390 Unspecified dementia without behavioral disturbance: Secondary | ICD-10-CM | POA: Diagnosis present

## 2022-03-07 DIAGNOSIS — Z951 Presence of aortocoronary bypass graft: Secondary | ICD-10-CM | POA: Diagnosis not present

## 2022-03-07 DIAGNOSIS — Z5181 Encounter for therapeutic drug level monitoring: Secondary | ICD-10-CM | POA: Diagnosis not present

## 2022-03-07 DIAGNOSIS — R079 Chest pain, unspecified: Principal | ICD-10-CM

## 2022-03-07 DIAGNOSIS — Z95810 Presence of automatic (implantable) cardiac defibrillator: Secondary | ICD-10-CM | POA: Diagnosis not present

## 2022-03-07 DIAGNOSIS — K30 Functional dyspepsia: Secondary | ICD-10-CM | POA: Diagnosis present

## 2022-03-07 DIAGNOSIS — Z20822 Contact with and (suspected) exposure to covid-19: Secondary | ICD-10-CM | POA: Diagnosis present

## 2022-03-07 DIAGNOSIS — I2510 Atherosclerotic heart disease of native coronary artery without angina pectoris: Secondary | ICD-10-CM | POA: Diagnosis present

## 2022-03-07 DIAGNOSIS — J42 Unspecified chronic bronchitis: Secondary | ICD-10-CM | POA: Diagnosis not present

## 2022-03-07 DIAGNOSIS — Z87891 Personal history of nicotine dependence: Secondary | ICD-10-CM

## 2022-03-07 LAB — CBC WITH DIFFERENTIAL/PLATELET
Abs Immature Granulocytes: 0.09 10*3/uL — ABNORMAL HIGH (ref 0.00–0.07)
Basophils Absolute: 0.1 10*3/uL (ref 0.0–0.1)
Basophils Relative: 1 %
Eosinophils Absolute: 0.6 10*3/uL — ABNORMAL HIGH (ref 0.0–0.5)
Eosinophils Relative: 8 %
HCT: 36.3 % — ABNORMAL LOW (ref 39.0–52.0)
Hemoglobin: 12 g/dL — ABNORMAL LOW (ref 13.0–17.0)
Immature Granulocytes: 1 %
Lymphocytes Relative: 12 %
Lymphs Abs: 0.9 10*3/uL (ref 0.7–4.0)
MCH: 31.5 pg (ref 26.0–34.0)
MCHC: 33.1 g/dL (ref 30.0–36.0)
MCV: 95.3 fL (ref 80.0–100.0)
Monocytes Absolute: 0.6 10*3/uL (ref 0.1–1.0)
Monocytes Relative: 8 %
Neutro Abs: 5.4 10*3/uL (ref 1.7–7.7)
Neutrophils Relative %: 70 %
Platelets: 253 10*3/uL (ref 150–400)
RBC: 3.81 MIL/uL — ABNORMAL LOW (ref 4.22–5.81)
RDW: 15.3 % (ref 11.5–15.5)
WBC: 7.7 10*3/uL (ref 4.0–10.5)
nRBC: 0 % (ref 0.0–0.2)

## 2022-03-07 LAB — I-STAT CHEM 8, ED
BUN: 28 mg/dL — ABNORMAL HIGH (ref 8–23)
Calcium, Ion: 1.12 mmol/L — ABNORMAL LOW (ref 1.15–1.40)
Chloride: 101 mmol/L (ref 98–111)
Creatinine, Ser: 2.7 mg/dL — ABNORMAL HIGH (ref 0.61–1.24)
Glucose, Bld: 107 mg/dL — ABNORMAL HIGH (ref 70–99)
HCT: 31 % — ABNORMAL LOW (ref 39.0–52.0)
Hemoglobin: 10.5 g/dL — ABNORMAL LOW (ref 13.0–17.0)
Potassium: 4.2 mmol/L (ref 3.5–5.1)
Sodium: 137 mmol/L (ref 135–145)
TCO2: 28 mmol/L (ref 22–32)

## 2022-03-07 LAB — TROPONIN I (HIGH SENSITIVITY)
Troponin I (High Sensitivity): 20 ng/L — ABNORMAL HIGH (ref <18)
Troponin I (High Sensitivity): 633 ng/L (ref <18)
Troponin I (High Sensitivity): 4110 ng/L (ref <18)

## 2022-03-07 LAB — MAGNESIUM: Magnesium: 2.1 mg/dL (ref 1.7–2.4)

## 2022-03-07 LAB — COMPREHENSIVE METABOLIC PANEL
ALT: 19 U/L (ref 0–44)
AST: 23 U/L (ref 15–41)
Albumin: 3.9 g/dL (ref 3.5–5.0)
Alkaline Phosphatase: 137 U/L — ABNORMAL HIGH (ref 38–126)
Anion gap: 9 (ref 5–15)
BUN: 22 mg/dL (ref 8–23)
CO2: 27 mmol/L (ref 22–32)
Calcium: 9.8 mg/dL (ref 8.9–10.3)
Chloride: 101 mmol/L (ref 98–111)
Creatinine, Ser: 2.54 mg/dL — ABNORMAL HIGH (ref 0.61–1.24)
GFR, Estimated: 25 mL/min — ABNORMAL LOW (ref >60)
Glucose, Bld: 116 mg/dL — ABNORMAL HIGH (ref 70–99)
Potassium: 3.9 mmol/L (ref 3.5–5.1)
Sodium: 137 mmol/L (ref 135–145)
Total Bilirubin: 0.5 mg/dL (ref 0.3–1.2)
Total Protein: 7.1 g/dL (ref 6.5–8.1)

## 2022-03-07 LAB — LIPASE, BLOOD: Lipase: 74 U/L — ABNORMAL HIGH (ref 11–51)

## 2022-03-07 LAB — TSH: TSH: 3.023 u[IU]/mL (ref 0.350–4.500)

## 2022-03-07 LAB — GLUCOSE, CAPILLARY: Glucose-Capillary: 108 mg/dL — ABNORMAL HIGH (ref 70–99)

## 2022-03-07 LAB — RESP PANEL BY RT-PCR (FLU A&B, COVID) ARPGX2
Influenza A by PCR: NEGATIVE
Influenza B by PCR: NEGATIVE
SARS Coronavirus 2 by RT PCR: NEGATIVE

## 2022-03-07 LAB — BRAIN NATRIURETIC PEPTIDE: B Natriuretic Peptide: 380.9 pg/mL — ABNORMAL HIGH (ref 0.0–100.0)

## 2022-03-07 MED ORDER — HYDRALAZINE HCL 25 MG PO TABS
25.0000 mg | ORAL_TABLET | Freq: Four times a day (QID) | ORAL | Status: DC | PRN
  Administered 2022-03-10: 25 mg via ORAL
  Filled 2022-03-07: qty 1

## 2022-03-07 MED ORDER — TORSEMIDE 20 MG PO TABS
80.0000 mg | ORAL_TABLET | Freq: Two times a day (BID) | ORAL | Status: DC
Start: 2022-03-07 — End: 2022-03-07

## 2022-03-07 MED ORDER — TAMSULOSIN HCL 0.4 MG PO CAPS
0.4000 mg | ORAL_CAPSULE | Freq: Every evening | ORAL | Status: DC
  Administered 2022-03-07 – 2022-03-10 (×4): 0.4 mg via ORAL
  Filled 2022-03-07 (×4): qty 1

## 2022-03-07 MED ORDER — ALLOPURINOL 100 MG PO TABS: 100.0000 mg | ORAL_TABLET | Freq: Every day | ORAL | Status: DC

## 2022-03-07 MED ORDER — ACETAMINOPHEN 325 MG PO TABS: 650.0000 mg | ORAL_TABLET | ORAL | Status: DC | PRN

## 2022-03-07 MED ORDER — ASPIRIN EC 81 MG PO TBEC
81.0000 mg | DELAYED_RELEASE_TABLET | Freq: Every day | ORAL | Status: DC
  Administered 2022-03-08 – 2022-03-11 (×4): 81 mg via ORAL
  Filled 2022-03-07 (×4): qty 1

## 2022-03-07 MED ORDER — ONDANSETRON HCL 4 MG/2ML IJ SOLN: 4.0000 mg | Freq: Four times a day (QID) | INTRAMUSCULAR | Status: DC | PRN

## 2022-03-07 MED ORDER — ATORVASTATIN CALCIUM 40 MG PO TABS
40.0000 mg | ORAL_TABLET | Freq: Every day | ORAL | Status: DC
  Administered 2022-03-07 – 2022-03-09 (×3): 40 mg via ORAL
  Filled 2022-03-07 (×3): qty 1

## 2022-03-07 MED ORDER — NITROGLYCERIN 0.4 MG SL SUBL: 0.4000 mg | SUBLINGUAL_TABLET | SUBLINGUAL | Status: DC | PRN

## 2022-03-07 MED ORDER — HEPARIN (PORCINE) 25000 UT/250ML-% IV SOLN
1350.0000 [IU]/h | INTRAVENOUS | Status: AC
  Administered 2022-03-07: 1250 [IU]/h via INTRAVENOUS
  Administered 2022-03-08 – 2022-03-09 (×2): 1350 [IU]/h via INTRAVENOUS
  Filled 2022-03-07 (×3): qty 250

## 2022-03-07 MED ORDER — ALBUTEROL SULFATE HFA 108 (90 BASE) MCG/ACT IN AERS: 2.0000 | INHALATION_SPRAY | Freq: Four times a day (QID) | RESPIRATORY_TRACT | Status: DC | PRN

## 2022-03-07 MED ORDER — ASPIRIN 81 MG PO CHEW: 324.0000 mg | CHEWABLE_TABLET | ORAL | Status: DC

## 2022-03-07 MED ORDER — LORATADINE 10 MG PO TABS
10.0000 mg | ORAL_TABLET | Freq: Every day | ORAL | Status: DC
  Administered 2022-03-08 – 2022-03-11 (×4): 10 mg via ORAL
  Filled 2022-03-07 (×4): qty 1

## 2022-03-07 MED ORDER — ALBUTEROL SULFATE (2.5 MG/3ML) 0.083% IN NEBU: 3.0000 mL | INHALATION_SOLUTION | Freq: Four times a day (QID) | RESPIRATORY_TRACT | Status: DC | PRN

## 2022-03-07 MED ORDER — FERROUS SULFATE 325 (65 FE) MG PO TABS
325.0000 mg | ORAL_TABLET | Freq: Every day | ORAL | Status: DC
  Administered 2022-03-08 – 2022-03-11 (×4): 325 mg via ORAL
  Filled 2022-03-07 (×4): qty 1

## 2022-03-07 MED ORDER — ASPIRIN 81 MG PO CHEW
324.0000 mg | CHEWABLE_TABLET | Freq: Once | ORAL | Status: AC
  Administered 2022-03-07: 324 mg via ORAL
  Filled 2022-03-07: qty 4

## 2022-03-07 MED ORDER — FOLIC ACID 1 MG PO TABS
500.0000 ug | ORAL_TABLET | Freq: Every day | ORAL | Status: DC
  Administered 2022-03-07 – 2022-03-11 (×5): 0.5 mg via ORAL
  Filled 2022-03-07 (×6): qty 1

## 2022-03-07 MED ORDER — ASPIRIN 300 MG RE SUPP: 300.0000 mg | RECTAL | Status: DC

## 2022-03-07 MED ORDER — TORSEMIDE 20 MG PO TABS
80.0000 mg | ORAL_TABLET | Freq: Two times a day (BID) | ORAL | Status: DC
Start: 2022-03-08 — End: 2022-03-07

## 2022-03-07 MED ORDER — FUROSEMIDE 10 MG/ML IJ SOLN
40.0000 mg | Freq: Two times a day (BID) | INTRAMUSCULAR | Status: DC
  Administered 2022-03-08 – 2022-03-09 (×3): 40 mg via INTRAVENOUS
  Filled 2022-03-07 (×3): qty 4

## 2022-03-07 MED ORDER — FLUTICASONE FUROATE-VILANTEROL 100-25 MCG/ACT IN AEPB
1.0000 | INHALATION_SPRAY | Freq: Every day | RESPIRATORY_TRACT | Status: DC
  Administered 2022-03-08 – 2022-03-11 (×4): 1 via RESPIRATORY_TRACT
  Filled 2022-03-07: qty 28

## 2022-03-07 MED ORDER — UMECLIDINIUM BROMIDE 62.5 MCG/ACT IN AEPB
1.0000 | INHALATION_SPRAY | Freq: Every day | RESPIRATORY_TRACT | Status: DC
  Administered 2022-03-08 – 2022-03-11 (×4): 1 via RESPIRATORY_TRACT
  Filled 2022-03-07: qty 7

## 2022-03-07 MED ORDER — FUROSEMIDE 10 MG/ML IJ SOLN: 40.0000 mg | Freq: Two times a day (BID) | INTRAMUSCULAR | Status: DC

## 2022-03-07 MED ORDER — LABETALOL HCL 200 MG PO TABS
300.0000 mg | ORAL_TABLET | Freq: Two times a day (BID) | ORAL | Status: DC
  Administered 2022-03-07 – 2022-03-11 (×8): 300 mg via ORAL
  Filled 2022-03-07 (×8): qty 2

## 2022-03-07 NOTE — ED Triage Notes (Signed)
Patient complains of a 2/10 burning sensation in left side of his chest that started at approximately 1200 today with left arm numbness. Speaking in complete sentences, alert, and in no apparent distress. ?

## 2022-03-07 NOTE — ED Provider Triage Note (Signed)
Emergency Medicine Provider Triage Evaluation Note ? ?Jotham Ahn , a 76 y.o. male  was evaluated in triage.  Pt complains of left chest burning, arm pain and sob that started just pta. ? ?Review of Systems  ?Positive: Chest pain, arm pain, sob ?Negative: Ble swelling ? ?Physical Exam  ?BP (!) 179/124   Pulse (!) 141   Temp 98.8 ?F (37.1 ?C) (Oral)   Resp 20   SpO2 94%  ?Gen:   Awake, no distress   ?Resp:  Normal effort  ?MSK:   Moves extremities without difficulty  ?Other:  Tachycardia, bibasilar rales, no ble edema ? ?Medical Decision Making  ?Medically screening exam initiated at 12:27 PM.  Appropriate orders placed.  Luca Dyar was informed that the remainder of the evaluation will be completed by another provider, this initial triage assessment does not replace that evaluation, and the importance of remaining in the ED until their evaluation is complete. ? ?Chest pain w/u initiated. Notified nursing staff pt needs a room.  ?  Rodney Booze, PA-C ?03/07/22 1227 ? ?

## 2022-03-07 NOTE — ED Provider Notes (Incomplete)
[facility] EMERGENCY DEPARTMENT Provider Note   CSN: [id number] Arrival date & time: 03/07/22  1209     History {Add pertinent medical, surgical, social history, OB history to HPI:1} Chief Complaint  Patient presents with   Chest Pain    Joe Wiggins is a 76 y.o. male.   Chest Pain Associated symptoms: numbness and shortness of breath   Patient presents for chest pain.  Onset was 30 minutes ago.  At the time, he was walking in the store.  He developed pain in the left side of his chest and felt numbness in his left arm.  He subsequently felt lightheaded.  He had mild shortness of breath.  Patient went and sat in the car and due to the persistence of his symptoms, came to the ED via private vehicle.  Pain had diminished on arrival in the ED waiting room.  Upon being bedded in the ED, patient reports complete resolution of symptoms, although he does feel soreness in the area of his pacemaker.  He did not notice a pacemaker defibrillation during this episode.  He states that prior to this episode, he was in his normal state of health.  He had a normal morning.  He has been taking all of his medications as prescribed.  He is no longer on amiodarone.  He does take Eliquis, torsemide, and labetalol.  He does not take a daily aspirin.  His medical history includes carotid artery stenosis, HLD, dementia, CHF, paroxysmal atrial fibrillation, CKD, CAD, OSA, TIA, CVA, anemia, COPD, previous cardiac arrest, and sinus node dysfunction s/p pacemaker placement.  He is followed by [facility].  His cardiologist is Dr. Gardiner Rhyme.  He underwent a CABG in 2014.  Most recent heart cath was 2019 which showed severe multivessel occlusive CAD.  Last echo was 1 year ago showing EF of 45 to 50%.  Last dose of Eliquis was this morning.  History per wife: Patient did not look any different during this episode.  He did not appear pale or diaphoretic.    Home Medications Prior to Admission medications    Medication Sig Start Date End Date Taking? Authorizing Provider  albuterol (ACCUNEB) 0.63 MG/3ML nebulizer solution Take 3 mLs by nebulization 4 (four) times daily as needed for shortness of breath or wheezing. 3 ml inhale orally via nebulizer four times a day    [provider]  albuterol (PROVENTIL HFA;VENTOLIN HFA) 108 (90 Base) MCG/ACT inhaler Inhale 2 puffs into the lungs every 6 (six) hours as needed for wheezing or shortness of breath. 06/30/16   [provider]  ascorbic acid (VITAMIN C) 500 MG tablet Take 500 mg by mouth daily.    [provider]  atorvastatin (LIPITOR) 40 MG tablet TAKE 1 TABLET EVERY DAY 01/24/22   Evans Lance, MD  cholecalciferol (VITAMIN D) 1000 units tablet Take 1,000 Units by mouth daily.    [provider]  Cyanocobalamin (VITAMIN B 12 PO) Take 1 tablet by mouth daily.    [provider]  ELIQUIS 5 MG TABS tablet TAKE 1 TABLET TWICE DAILY 11/01/21   Evans Lance, MD  ferrous sulfate 325 (65 FE) MG tablet Take 325 mg by mouth daily with breakfast. 01/03/18   [provider]  folic acid (FOLVITE) 381 MCG tablet Take 400 mcg by mouth daily.    [provider]  labetalol (NORMODYNE) 300 MG tablet Take 1 tablet (300 mg total) by mouth 2 (two) times daily. 06/10/21   Cristopher Peru  W, MD  Multiple Vitamin (MULTIVITAMIN) tablet Take 1 tablet by mouth daily.    [provider]  nitroGLYCERIN (NITROSTAT) 0.4 MG SL tablet PLACE 1 TABLET (0.4 MG TOTAL) UNDER THE TONGUE EVERY 5 (FIVE) MINUTES AS NEEDED FOR CHEST PAIN. 01/24/22   Evans Lance, MD  torsemide (DEMADEX) 20 MG tablet Take 4 tablets (80 mg total) by mouth 2 (two) times daily. 10/13/21 07/10/22  Donato Heinz, MD      Allergies    Ipratropium, Metoprolol, Morphine and related, Vicodin [hydrocodone-acetaminophen], and Penicillins    Review of Systems   Review of Systems  Respiratory:  Positive for shortness of breath.    Cardiovascular:  Positive for chest pain.  Neurological:  Positive for light-headedness and numbness.  All other systems reviewed and are negative.  Physical Exam Updated Vital Signs BP (!) 179/124    Pulse (!) 141    Temp 98.8 F (37.1 C) (Oral)    Resp 20    SpO2 94%  Physical Exam Vitals and nursing note reviewed.  Constitutional:      General: He is not in acute distress.    Appearance: He is well-developed and normal weight. He is not ill-appearing, toxic-appearing or diaphoretic.  HENT:     Head: Normocephalic and atraumatic.  Eyes:     Extraocular Movements: Extraocular movements intact.     Conjunctiva/sclera: Conjunctivae normal.  Neck:     Vascular: No JVD.  Cardiovascular:     Rate and Rhythm: Tachycardia present.     Heart sounds: No murmur heard. Pulmonary:     Effort: Pulmonary effort is normal. No tachypnea or respiratory distress.     Breath sounds: Normal breath sounds.  Chest:     Chest wall: No crepitus or edema.  Abdominal:     Palpations: Abdomen is soft.     Tenderness: There is no abdominal tenderness.  Musculoskeletal:        General: No swelling.     Cervical back: Normal range of motion and neck supple.     Right lower leg: No edema.     Left lower leg: No edema.  Skin:    General: Skin is warm and dry.     Coloration: Skin is not cyanotic or pale.  Neurological:     General: No focal deficit present.     Mental Status: He is alert and oriented to person, place, and time.     Cranial Nerves: No cranial nerve deficit.     Motor: No weakness.  Psychiatric:        Mood and Affect: Mood normal.        Behavior: Behavior normal.    ED Results / Procedures / Treatments   Labs (all labs ordered are listed, but only abnormal results are displayed) Labs Reviewed  BASIC METABOLIC PANEL  CBC  TROPONIN I (HIGH SENSITIVITY)    EKG None  Radiology No results found.  Procedures Procedures  {Document cardiac monitor, telemetry assessment  procedure when appropriate:1}  Medications Ordered in ED Medications - No data to display  ED Course/ Medical Decision Making/ A&P                           Medical Decision Making Amount and/or Complexity of Data Reviewed Labs: ordered.   This patient presents to the ED for concern of ***, this involves an extensive number of treatment options, and is a complaint that carries with it a  high risk of complications and morbidity.  The differential diagnosis includes ***   Co morbidities that complicate the patient evaluation  ***   Additional history obtained:  Additional history obtained from *** External records from outside source obtained and reviewed including ***   Lab Tests:  I Ordered, and personally interpreted labs.  The pertinent results include:  ***   Imaging Studies ordered:  I ordered imaging studies including ***  I independently visualized and interpreted imaging which showed *** I agree with the radiologist interpretation   Cardiac Monitoring:  The patient was maintained on a cardiac monitor.  I personally viewed and interpreted the cardiac monitored which showed an underlying rhythm of: ***   Medicines ordered and prescription drug management:  I ordered medication including ***  for ***  Reevaluation of the patient after these medicines showed that the patient {resolved/improved/worsened:23923::"improved"} I have reviewed the patients home medicines and have made adjustments as needed   Test Considered:  ***   Critical Interventions:  ***   Consultations Obtained:  I requested consultation with the ***,  and discussed lab and imaging findings as well as pertinent plan - they recommend: ***   Problem List / ED Course:  ***    Social Determinants of Health:  ***     {Document critical care time when appropriate:1} {Document review of labs and clinical decision tools ie heart score, Chads2Vasc2 etc:1}  {Document your  independent review of radiology images, and any outside records:1} {Document your discussion with family members, caretakers, and with consultants:1} {Document social determinants of health affecting pt's care:1} {Document your decision making why or why not admission, treatments were needed:1} Final Clinical Impression(s) / ED Diagnoses Final diagnoses:  None    Rx / DC Orders ED Discharge Orders     None

## 2022-03-07 NOTE — Progress Notes (Signed)
ANTICOAGULATION CONSULT NOTE - Initial Consult ? ?Pharmacy Consult for heparin ?Indication: chest pain/ACS ? ?Allergies  ?Allergen Reactions  ? Ipratropium Other (See Comments)  ? Metoprolol   ?  Was bringing blood pressure too low and he would pass out.  ? Morphine And Related Nausea Only  ? Vicodin [Hydrocodone-Acetaminophen] Other (See Comments)  ?  Hallucinations ?  ? Penicillins Rash  ?  Did it involve swelling of the face/tongue/throat, SOB, or low BP? No ?Did it involve sudden or severe rash/hives, skin peeling, or any reaction on the inside of your mouth or nose? Yes ?Did you need to seek medical attention at a hospital or doctor's office? No ?When did it last happen? Over 7 Years Ago      ?If all above answers are "NO", may proceed with cephalosporin use. ? ?  ? ? ?Patient Measurements: ?  ?Heparin Dosing Weight: 85 kg ? ?Vital Signs: ?Temp: 98.8 ?F (37.1 ?C) (03/13 1218) ?Temp Source: Oral (03/13 1218) ?BP: 170/75 (03/13 1800) ?Pulse Rate: 73 (03/13 1800) ? ?Labs: ?Recent Labs  ?  03/07/22 ?1228 03/07/22 ?1450 03/07/22 ?1455  ?HGB 12.0* 10.5*  --   ?HCT 36.3* 31.0*  --   ?PLT 253  --   --   ?CREATININE 2.54* 2.70*  --   ?TROPONINIHS 20*  --  633*  ? ? ?CrCl cannot be calculated (Unknown ideal weight.). ? ? ?Medical History: ?Past Medical History:  ?Diagnosis Date  ? Abrasion of forearm, right 07/27/2019  ? Acute respiratory failure ([place])   ? AF (paroxysmal atrial fibrillation) ([place])   ? AICD (automatic cardioverter/defibrillator) present   ? CAD (coronary artery disease)   ? Cardiomyopathy, ischemic   ? Carotid artery occlusion   ? Chronic kidney disease   ? COPD (chronic obstructive pulmonary disease) ([place])   ? Cough 11/13/2019  ? Dementia without behavioral disturbance ([place])   ? Dizziness 01/17/2016  ? Facial rash 08/15/2018  ? GERD (gastroesophageal reflux disease)   ? Hyperlipidemia   ? Hypertension   ? Pre-syncope 01/16/2018  ? Respiratory distress 12/27/2020  ? Sleep apnea   ? Stroke [facility])   ? TIA  (transient ischemic attack)   ? ?  ? ?Assessment: ?76 yo M on apixaban PTA for afib. Last dose 3/13 @ 9AM. Patient admitted for chest pain with elevated troponins and PMH of carotid artery stenosis, HLD, dementia, CHF, CKD, CAD, OSA, TIA, CVA, anemia, COPD, previous cardiac arrest, and sinus node dysfunction s/p pacemaker placement. Pharmacy consulted for heparin.   ? ?Weight not yet entered in ED. Recent weights around 184-188 lb. Will use heparin dosing weight of 85 kg. Scr is elevated.  ? ?Goal of Therapy:  ?Heparin level 0.3-0.7 units/ml ?Monitor platelets by anticoagulation protocol: Yes ?  ?Plan:  ?Start heparin 1250 units/hr at 20:00, no bolus ?Monitor daily heparin level, CBC ?Monitor for signs/symptoms of bleeding  ?F/u cardiac work up and restart apixaban  ? ? ?Benetta Spar, PharmD, BCPS, BCCP ?Clinical Pharmacist ? ?Please check AMION for all [facility] phone numbers ?After 10:00 PM, call [facility] [phone] ? ?

## 2022-03-07 NOTE — Consult Note (Signed)
CARDIOLOGY CONSULT NOTE       Patient ID: Joe Wiggins MRN: [id number] DOB/AGE: July 01, 1946 76 y.o.  Admit date: 03/07/2022 Referring Physician: [facility]  Primary Physician: Loura Pardon, MD Primary Cardiologist: Gardiner Rhyme Reason for Consultation: Lovena Le  Active Problems:   * No active hospital problems. *   HPI:  76 y.o. seen in ED for SSCP and elevated troponin History of ischemic DCM with EF 45-50% by TTE 12/28/20. CABG done at [facility] 2014. Last cath here 12/29/17 patent grafts. Unusual Y graft to D1, OM and PDA and patent LIMA to LAD. [place] years ago On eliquis for PAFlast dose this am.  CRF with baseline Cr around 2.5.  Sees Dr Joelyn Oms from [facility]. Was gong to see him today and had 15 minutes of SSCP.  Resolved spontaneously Did not take nitro. In ER ECG;s with ? Some flutter at rapid rates patient unaware ECG with LBBB previously IVCD with LVH. Initial troponin negative but 2 nd one 633.  Now clearly in NSR  AICD interrogation shows no device Rx. No PAF and normal parameters   ROS All other systems reviewed and negative except as noted above  Past Medical History:  Diagnosis Date   Abrasion of forearm, right 07/27/2019   Acute respiratory failure ([facility])    AF (paroxysmal atrial fibrillation) ([facility])    AICD (automatic cardioverter/defibrillator) present    CAD (coronary artery disease)    Cardiomyopathy, ischemic    Carotid artery occlusion    Chronic kidney disease    COPD (chronic obstructive pulmonary disease) ([place])    Cough 11/13/2019   Dementia without behavioral disturbance ([facility])    Dizziness 01/17/2016   Facial rash 08/15/2018   GERD (gastroesophageal reflux disease)    Hyperlipidemia    Hypertension    Pre-syncope 01/16/2018   Respiratory distress 12/27/2020   Sleep apnea    Stroke ([place])    TIA (transient ischemic attack)     Family History  Problem Relation Age of Onset   Hypertension Mother    Heart disease Mother     Hypertension Sister    Heart disease Sister    Thyroid disease Sister    Thyroid disease Brother        hyperthyroid    Social History   Socioeconomic History   Marital status: Married    Spouse name: Not on file   Number of children: Not on file   Years of education: Not on file   Highest education level: Not on file  Occupational History   Not on file  Tobacco Use   Smoking status: Former    Types: Cigarettes    Quit date: 11/30/2012    Years since quitting: 9.2   Smokeless tobacco: Never  Vaping Use   Vaping Use: Never used  Substance and Sexual Activity   Alcohol use: No   Drug use: No   Sexual activity: Not on file  Other Topics Concern   Not on file  Social History Narrative   Not on file   Social Determinants of Health   Financial Resource Strain: Not on file  Food Insecurity: Not on file  Transportation Needs: Not on file  Physical Activity: Not on file  Stress: Not on file  Social Connections: Not on file  Intimate Partner Violence: Not on file    Past Surgical History:  Procedure Laterality Date   [place]  ARTERY BYPASS GRAFT     CORONARY STENT PLACEMENT     EYE SURGERY     ICD IMPLANT N/A 12/29/2017   Procedure: ICD IMPLANT;  Surgeon: Evans Lance, MD;  Location: [facility];  Service: Cardiovascular;  Laterality: N/A;   LEFT HEART CATH AND CORS/GRAFTS ANGIOGRAPHY N/A 12/29/2017   Procedure: LEFT HEART CATH AND CORS/GRAFTS ANGIOGRAPHY;  Surgeon: Martinique, Daryl Quiros M, MD;  Location: [facility];  Service: Cardiovascular;  Laterality: N/A;     No current facility-administered medications for this encounter.  Current Outpatient Medications:    albuterol (ACCUNEB) 0.63 MG/3ML nebulizer solution, Take 3 mLs by nebulization 4 (four) times daily as needed for shortness of breath or wheezing. 3 ml inhale orally via nebulizer four times a day, Disp: , Rfl:    albuterol (PROVENTIL HFA;VENTOLIN  HFA) 108 (90 Base) MCG/ACT inhaler, Inhale 2 puffs into the lungs every 6 (six) hours as needed for wheezing or shortness of breath., Disp: , Rfl:    atorvastatin (LIPITOR) 40 MG tablet, TAKE 1 TABLET EVERY DAY, Disp: 90 tablet, Rfl: 2   cetirizine (ZYRTEC) 10 MG tablet, Take 10 mg by mouth daily., Disp: , Rfl:    cholecalciferol (VITAMIN D) 1000 units tablet, Take 1,000 Units by mouth daily., Disp: , Rfl:    colchicine 0.6 MG tablet, Take 0.6 mg by mouth 2 (two) times daily as needed (gout)., Disp: , Rfl:    Cyanocobalamin (VITAMIN B 12 PO), Take 1 tablet by mouth daily., Disp: , Rfl:    ELIQUIS 5 MG TABS tablet, TAKE 1 TABLET TWICE DAILY, Disp: 180 tablet, Rfl: 1   ferrous sulfate 325 (65 FE) MG tablet, Take 325 mg by mouth daily with breakfast., Disp: , Rfl:    folic acid (FOLVITE) 478 MCG tablet, Take 400 mcg by mouth daily., Disp: , Rfl:    labetalol (NORMODYNE) 300 MG tablet, Take 1 tablet (300 mg total) by mouth 2 (two) times daily., Disp: 180 tablet, Rfl: 2   Multiple Vitamin (MULTIVITAMIN) tablet, Take 1 tablet by mouth daily., Disp: , Rfl:    nitroGLYCERIN (NITROSTAT) 0.4 MG SL tablet, PLACE 1 TABLET (0.4 MG TOTAL) UNDER THE TONGUE EVERY 5 (FIVE) MINUTES AS NEEDED FOR CHEST PAIN., Disp: 75 tablet, Rfl: 2   tamsulosin (FLOMAX) 0.4 MG CAPS capsule, Take 0.4 mg by mouth every evening., Disp: , Rfl:    torsemide (DEMADEX) 20 MG tablet, Take 4 tablets (80 mg total) by mouth 2 (two) times daily., Disp: 720 tablet, Rfl: 2   TRELEGY ELLIPTA 100-62.5-25 MCG/ACT AEPB, Inhale 1 puff into the lungs daily., Disp: , Rfl:    allopurinol (ZYLOPRIM) 100 MG tablet, Take 100 mg by mouth daily., Disp: , Rfl:     Physical Exam: Blood pressure (!) 181/98, pulse 77, temperature 98.8 F (37.1 C), temperature source Oral, resp. rate 20, SpO2 100 %.     No distress Basilar crackles  Left carotid burit No murmur  Post sternotomy Abdomen benign No edema Good left radial pusle   Labs:   Lab Results   Component Value Date   WBC 7.7 03/07/2022   HGB 10.5 (L) 03/07/2022   HCT 31.0 (L) 03/07/2022   MCV 95.3 03/07/2022   PLT 253 03/07/2022    Recent Labs  Lab 03/07/22 1228 03/07/22 1450  NA 137 137  K 3.9 4.2  CL 101 101  CO2 27  --   BUN 22 28*  CREATININE 2.54* 2.70*  CALCIUM 9.8  --  PROT 7.1  --   BILITOT 0.5  --   ALKPHOS 137*  --   ALT 19  --   AST 23  --   GLUCOSE 116* 107*   Lab Results  Component Value Date   TROPONINI 3.81 (HH) 12/29/2017    Lab Results  Component Value Date   CHOL 104 01/06/2021   CHOL 146 12/26/2017   CHOL 129 01/17/2016   Lab Results  Component Value Date   HDL 54 01/06/2021   HDL 32 (L) 12/26/2017   HDL 28 (L) 01/17/2016   Lab Results  Component Value Date   LDLCALC 36 01/06/2021   LDLCALC 81 12/26/2017   LDLCALC 76 01/17/2016   Lab Results  Component Value Date   TRIG 65 01/06/2021   TRIG 166 (H) 12/26/2017   TRIG 123 01/17/2016   Lab Results  Component Value Date   CHOLHDL 1.9 01/06/2021   CHOLHDL 4.6 12/26/2017   CHOLHDL 4.6 01/17/2016   No results found for: LDLDIRECT    Radiology: DG Chest 1 View  Result Date: 03/07/2022 CLINICAL DATA:  LEFT-sided chest pain in a 76 year old male. EXAM: CHEST  1 VIEW COMPARISON:  January 06, 2021 and May 12, 2021. FINDINGS: Signs of LEFT carotid stenting as before. Power pack over the LEFT chest for 3 lead pacer defibrillator device. Post median sternotomy. Cardiomediastinal contours and hilar structures are stable. Increased interstitial markings are noted at the lung bases more pronounced than on previous imaging. There no pneumothorax. No lobar consolidative process. More nodular density amidst areas of suspected scarring at the LEFT lung base measuring up to 9 mm On limited assessment there is no acute skeletal finding. IMPRESSION: 1. Increased interstitial markings at the lung bases more pronounced than on previous imaging may represent mild edema or pneumonitis superimposed on  chronic scarring. Consider follow-up PA and lateral chest radiograph to ensure resolution. 2. More nodular area amidst chronic changes at the LEFT lung base warrants dedicated attention on subsequent imaging, would consider initial assessment with PA and lateral chest radiograph at 4-6 weeks to ensure resolution. 3. No acute cardiopulmonary process. Electronically Signed   By: Zetta Bills M.D.   On: 03/07/2022 13:10    EKG: See HPI   ASSESSMENT AND PLAN:   CAD/CABG:  with 15 minute episode SSCP elevated troponin ECG with LBBB Pain free now Not a candidate for acute cath with eliquis on board and CRF Cr 2.7.  Start heparin in lieu of PM eliquis dose. TTE to reassess EF. Trend troponin Like to avoid cath if possible given renal failure and fact that he would require a lot of dye to image/intervene on grafts. Continue beta blocker statin and asa Will keep NPO in am in case he has recurrent pain, very elevated troponin or TTE shows change in EF CHF:  Optivol shows fluid overload last 9 days CXR with cephalization BNP 380 Change demedex to iv daily Not on ACE/ARB/ARNI due to renal failure Follow K/Cr AICD:  ECG;s show tachycardia on admission not clearly atrial/flutter Device shows no PAF and not shocks or anti tachycardic pacing Follow on telemetry Has been on amiodarone in past. Lopressor listed as allergy  Holding eliquis for possible cath CRF:  check BMET in am Needs diuretic for CHF/volume overload. Would be high risk for worsening azotemia with cath  Carotid:  previous stent to left ICA patent with 50-75% stenosis on duplex 11/11/21 total right ICA.  Has elevated left subclavian velocity 3.65 cm/sec so would not  use left radial for cath was referred to [facility] in past but don't see recent note  PAF:  NSR currently   Signed: Jenkins Rouge 03/07/2022, 5:53 PM

## 2022-03-07 NOTE — ED Notes (Signed)
Phlebotomy stuck pt for troponin, TSH but did not see order for PT INR. Pt refusing additional blood draw at this time. Dr Doren Custard updated on same. ?

## 2022-03-07 NOTE — H&P (Addendum)
History and Physical    Joe Wiggins WEX:[id number] DOB: [birth year] DOA: 03/07/2022  PCP: Joe Pardon, MD (Confirm with patient/family/NH records and if not entered, this has to be entered at [facility] point of entry) Patient coming from: Home  I have personally briefly reviewed patient's old medical records in [place]  Chief Complaint: Chest pain  HPI: Joe Wiggins is a 76 y.o. 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  This happened around noontime before lunch, patient was walking, and suddenly started to have burning-like chest pain, retrosternal, radiating to left upper arm, associated with shortness of breath.  Immediately patient sat down and let the episode pass.  He did not take any nitroglycerin or any other medication for the episode.  The whole episode subsided in about 20 minutes.  ED Course: No tachycardia, blood pressure significantly elevated, no hypoxia.  Troponins 20 > 630, EKG showed chronic LBBB.  Chest x-ray showed increased interstitial marking which is chronic.  Review of Systems: As per HPI otherwise 14 point review of systems negative.    Past Medical History:  Diagnosis Date   Abrasion of forearm, right 07/27/2019   Acute respiratory failure (HCC)    AF (paroxysmal atrial fibrillation) (HCC)    AICD (automatic cardioverter/defibrillator) present    CAD (coronary artery disease)    Cardiomyopathy, ischemic    Carotid artery occlusion    Chronic kidney disease    COPD (chronic obstructive pulmonary disease) ([place])    Cough 11/13/2019   Dementia without behavioral disturbance (HCC)    Dizziness 01/17/2016   Facial rash 08/15/2018   GERD (gastroesophageal reflux disease)    Hyperlipidemia    Hypertension    Pre-syncope 01/16/2018    Respiratory distress 12/27/2020   Sleep apnea    Stroke ([place])    TIA (transient ischemic attack)     Past Surgical History:  Procedure Laterality Date   CARDIAC PACEMAKER PLACEMENT     CATARACT EXTRACTION     CORONARY ARTERY BYPASS GRAFT     CORONARY STENT PLACEMENT     EYE SURGERY     ICD IMPLANT N/A 12/29/2017   Procedure: ICD IMPLANT;  Surgeon: Evans Lance, MD;  Location: [facility];  Service: Cardiovascular;  Laterality: N/A;   LEFT HEART CATH AND CORS/GRAFTS ANGIOGRAPHY N/A 12/29/2017   Procedure: LEFT HEART CATH AND CORS/GRAFTS ANGIOGRAPHY;  Surgeon: Martinique, Peter M, MD;  Location: [facility];  Service: Cardiovascular;  Laterality: N/A;     reports that he quit smoking about 9 years ago. His smoking use included cigarettes. He has never used smokeless tobacco. He reports that he does not drink alcohol and does not use drugs.  Allergies  Allergen Reactions   Ipratropium Other (See Comments)   Metoprolol     Was bringing blood pressure too low and he would pass out.   Morphine And Related Nausea Only   Vicodin [Hydrocodone-Acetaminophen] Other (See Comments)    Hallucinations    Penicillins Rash    Did it involve swelling of the face/tongue/throat, SOB, or low BP? No Did it involve sudden or severe rash/hives, skin peeling, or any reaction on the inside of your mouth or nose? Yes Did you need to seek medical attention at a hospital or doctor's office? No When did  it last happen? Over 7 Years Ago      If all above answers are "NO", may proceed with cephalosporin use.      Family History  Problem Relation Age of Onset   Hypertension Mother    Heart disease Mother    Hypertension Sister    Heart disease Sister    Thyroid disease Sister    Thyroid disease Brother        hyperthyroid     Prior to Admission medications   Medication Sig Start Date End Date Taking? Authorizing Provider  albuterol (ACCUNEB) 0.63 MG/3ML nebulizer solution Take 3 mLs by  nebulization 4 (four) times daily as needed for shortness of breath or wheezing. 3 ml inhale orally via nebulizer four times a day   Yes [provider]  albuterol (PROVENTIL HFA;VENTOLIN HFA) 108 (90 Base) MCG/ACT inhaler Inhale 2 puffs into the lungs every 6 (six) hours as needed for wheezing or shortness of breath. 06/30/16  Yes [provider]  atorvastatin (LIPITOR) 40 MG tablet TAKE 1 TABLET EVERY DAY 01/24/22  Yes Evans Lance, MD  cetirizine (ZYRTEC) 10 MG tablet Take 10 mg by mouth daily.   Yes [provider]  cholecalciferol (VITAMIN D) 1000 units tablet Take 1,000 Units by mouth daily.   Yes [provider]  colchicine 0.6 MG tablet Take 0.6 mg by mouth 2 (two) times daily as needed (gout). 02/14/22  Yes [provider]  Cyanocobalamin (VITAMIN B 12 PO) Take 1 tablet by mouth daily.   Yes [provider]  ELIQUIS 5 MG TABS tablet TAKE 1 TABLET TWICE DAILY 11/01/21  Yes Evans Lance, MD  ferrous sulfate 325 (65 FE) MG tablet Take 325 mg by mouth daily with breakfast. 01/03/18  Yes [provider]  folic acid (FOLVITE) 119 MCG tablet Take 400 mcg by mouth daily.   Yes [provider]  labetalol (NORMODYNE) 300 MG tablet Take 1 tablet (300 mg total) by mouth 2 (two) times daily. 06/10/21  Yes Evans Lance, MD  Multiple Vitamin (MULTIVITAMIN) tablet Take 1 tablet by mouth daily.   Yes [provider]  nitroGLYCERIN (NITROSTAT) 0.4 MG SL tablet PLACE 1 TABLET (0.4 MG TOTAL) UNDER THE TONGUE EVERY 5 (FIVE) MINUTES AS NEEDED FOR CHEST PAIN. 01/24/22  Yes Evans Lance, MD  tamsulosin [facility]) 0.4 MG CAPS capsule Take 0.4 mg by mouth every evening. 02/17/22  Yes [provider]  torsemide (DEMADEX) 20 MG tablet Take 4 tablets (80 mg total) by mouth 2 (two) times daily. 10/13/21 07/10/22 Yes Donato Heinz, MD  TRELEGY ELLIPTA 100-62.5-25 MCG/ACT AEPB Inhale 1 puff into the lungs daily. 01/21/22  Yes  [provider]  allopurinol (ZYLOPRIM) 100 MG tablet Take 100 mg by mouth daily. 03/04/22   [provider]    Physical Exam: Vitals:   03/07/22 1530 03/07/22 1715 03/07/22 1745 03/07/22 1800  BP: (!) 145/84 (!) 181/98 (!) 168/87 (!) 170/75  Pulse: 73 77 73 73  Resp: (!) 21 20 (!) 23 20  Temp:      TempSrc:      SpO2: 96% 100% 96% 96%    Constitutional: NAD, calm, comfortable Vitals:   03/07/22 1530 03/07/22 1715 03/07/22 1745 03/07/22 1800  BP: (!) 145/84 (!) 181/98 (!) 168/87 (!) 170/75  Pulse: 73 77 73 73  Resp: (!) 21 20 (!) 23 20  Temp:      TempSrc:      SpO2: 96% 100% 96%  96%   Eyes: PERRL, lids and conjunctivae normal ENMT: Mucous membranes are moist. Posterior pharynx clear of any exudate or lesions.Normal dentition.  Neck: normal, supple, no masses, no thyromegaly Respiratory: clear to auscultation bilaterally, no wheezing, no crackles. Normal respiratory effort. No accessory muscle use.  Cardiovascular: Regular rate and rhythm, no murmurs / rubs / gallops. No extremity edema. 2+ pedal pulses. No carotid bruits.  Abdomen: no tenderness, no masses palpated. No hepatosplenomegaly. Bowel sounds positive.  Musculoskeletal: no clubbing / cyanosis. No joint deformity upper and lower extremities. Good ROM, no contractures. Normal muscle tone.  Skin: no rashes, lesions, ulcers. No induration Neurologic: CN 2-12 grossly intact. Sensation intact, DTR normal. Strength 5/5 in all 4.  Psychiatric: Normal judgment and insight. Alert and oriented x 3. Normal mood.   Labs on Admission: I have personally reviewed following labs and imaging studies  CBC: Recent Labs  Lab 03/07/22 1228 03/07/22 1450  WBC 7.7  --   NEUTROABS 5.4  --   HGB 12.0* 10.5*  HCT 36.3* 31.0*  MCV 95.3  --   PLT 253  --    Basic Metabolic Panel: Recent Labs  Lab 03/07/22 1228 03/07/22 1450  NA 137 137  K 3.9 4.2  CL 101 101  CO2 27  --   GLUCOSE 116* 107*  BUN 22 28*   CREATININE 2.54* 2.70*  CALCIUM 9.8  --   MG 2.1  --    GFR: CrCl cannot be calculated (Unknown ideal weight.). Liver Function Tests: Recent Labs  Lab 03/07/22 1228  AST 23  ALT 19  ALKPHOS 137*  BILITOT 0.5  PROT 7.1  ALBUMIN 3.9   Recent Labs  Lab 03/07/22 1228  LIPASE 74*   No results for input(s): AMMONIA in the last 168 hours. Coagulation Profile: No results for input(s): INR, PROTIME in the last 168 hours. Cardiac Enzymes: No results for input(s): CKTOTAL, CKMB, CKMBINDEX, TROPONINI in the last 168 hours. BNP (last 3 results) No results for input(s): PROBNP in the last 8760 hours. HbA1C: No results for input(s): HGBA1C in the last 72 hours. CBG: No results for input(s): GLUCAP in the last 168 hours. Lipid Profile: No results for input(s): CHOL, HDL, LDLCALC, TRIG, CHOLHDL, LDLDIRECT in the last 72 hours. Thyroid Function Tests: Recent Labs    03/07/22 1454  TSH 3.023   Anemia Panel: No results for input(s): VITAMINB12, FOLATE, FERRITIN, TIBC, IRON, RETICCTPCT in the last 72 hours. Urine analysis:    Component Value Date/Time   COLORURINE YELLOW 01/17/2016 [place] 01/17/2016 1519   LABSPEC 1.015 01/17/2016 1519   PHURINE 7.0 01/17/2016 1519   GLUCOSEU NEGATIVE 01/17/2016 1519   HGBUR NEGATIVE 01/17/2016 1519   BILIRUBINUR NEGATIVE 01/17/2016 1519   KETONESUR NEGATIVE 01/17/2016 1519   PROTEINUR NEGATIVE 01/17/2016 1519   NITRITE NEGATIVE 01/17/2016 1519   LEUKOCYTESUR NEGATIVE 01/17/2016 1519    Radiological Exams on Admission: DG Chest 1 View  Result Date: 03/07/2022 CLINICAL DATA:  LEFT-sided chest pain in a 76 year old male. EXAM: CHEST  1 VIEW COMPARISON:  January 06, 2021 and May 12, 2021. FINDINGS: Signs of LEFT carotid stenting as before. Power pack over the LEFT chest for 3 lead pacer defibrillator device. Post median sternotomy. Cardiomediastinal contours and hilar structures are stable. Increased interstitial markings  are noted at the lung bases more pronounced than on previous imaging. There no pneumothorax. No lobar consolidative process. More nodular density amidst areas of suspected scarring at the LEFT lung base measuring  up to 9 mm On limited assessment there is no acute skeletal finding. IMPRESSION: 1. Increased interstitial markings at the lung bases more pronounced than on previous imaging may represent mild edema or pneumonitis superimposed on chronic scarring. Consider follow-up PA and lateral chest radiograph to ensure resolution. 2. More nodular area amidst chronic changes at the LEFT lung base warrants dedicated attention on subsequent imaging, would consider initial assessment with PA and lateral chest radiograph at 4-6 weeks to ensure resolution. 3. No acute cardiopulmonary process. Electronically Signed   By: Zetta Bills M.D.   On: 03/07/2022 13:10    EKG: Independently reviewed.  Sinus, chronic LBBB  Assessment/Plan Principal Problem:   NSTEMI (non-ST elevated myocardial infarction) ([place])  (please populate well all problems here in Problem List. (For example, if patient is on BP meds at home and you resume or decide to hold them, it is a problem that needs to be her. Same for CAD, COPD, HLD and so on)  NSTEMI -Likely secondary to multivessel CAD, most recent cath in 2019 showed multivessel CAD 75% - 100% stenosis including mid LM, OST LAD-proximal LAD, OST first marginal, proximal Cx to mid Cx and proximal RCA to mid RCA. -Starting ACS meds including aspirin, heparin drip, continue beta-blocker and statin.  Not on ACEI or ARB due to CKD. -Cardiology consultation appreciated, will keep Patient n.p.o. after midnight.  HTN, uncontrolled -Likely secondary to NSTEMI -Continue labetalol, add as needed hydralazine.  Acute on chronic combined systolic and diastolic CHF decompensation -Probably mild fluid overload, as per cardiology recommendation we will change Lasix from p.o. to IV starting  now  CKD stage IV -Creatinine level stable, euvolemic, as per cardiologist recommendation we will change Lasix from p.o. to IV. -Daily BMP  PAF -Sinus rhythm -Last dose of Eliquis this morning, will start heparin drip this evening.  COPD -Stable, no symptoms or signs of exacerbation, continue as needed breathing meds and Ellipta.  HLD -Statin, high intensity  BPH -Continue Flomax  Left lower lobe lung nodule -Chronic, recommend outpatient CT.  DVT prophylaxis: Heparin drip Code Status: Full code Family Communication: Wife at bedside Disposition Plan: Patient has known CAD multivessel disease came with NSTEMI, expect more than 2 midnight hospital stay, expect cardiology work-up. Consults called: Cardiology Admission status: Telemetry admission   Lequita Halt MD [facility] Pager [phone]  03/07/2022, 6:22 PM

## 2022-03-07 NOTE — ED Provider Notes (Signed)
Patient seen after prior EDP. ? ?Patient's troponin is increasing. ? ?Patient without complaint of current chest pain or difficulty breathing. ? ?Patient's case discussed with Dr. Johnsie Cancel of cardiology.  Cardiology will consult. ? ?Patient will be admitted by the hospitalist service. ?  ?Valarie Merino, MD ?03/07/22 1805 ? ?

## 2022-03-07 NOTE — ED Notes (Signed)
Relayed [facility] report to Dr Doren Custard ?

## 2022-03-08 ENCOUNTER — Other Ambulatory Visit: Payer: Self-pay

## 2022-03-08 ENCOUNTER — Inpatient Hospital Stay (HOSPITAL_COMMUNITY): Payer: Medicare HMO

## 2022-03-08 ENCOUNTER — Encounter (HOSPITAL_COMMUNITY): Payer: Self-pay | Admitting: Internal Medicine

## 2022-03-08 DIAGNOSIS — N184 Chronic kidney disease, stage 4 (severe): Secondary | ICD-10-CM | POA: Diagnosis not present

## 2022-03-08 DIAGNOSIS — Z950 Presence of cardiac pacemaker: Secondary | ICD-10-CM

## 2022-03-08 DIAGNOSIS — I5043 Acute on chronic combined systolic (congestive) and diastolic (congestive) heart failure: Secondary | ICD-10-CM | POA: Diagnosis not present

## 2022-03-08 DIAGNOSIS — J42 Unspecified chronic bronchitis: Secondary | ICD-10-CM

## 2022-03-08 DIAGNOSIS — I480 Paroxysmal atrial fibrillation: Secondary | ICD-10-CM

## 2022-03-08 DIAGNOSIS — I214 Non-ST elevation (NSTEMI) myocardial infarction: Secondary | ICD-10-CM | POA: Diagnosis not present

## 2022-03-08 LAB — ECHOCARDIOGRAM COMPLETE
AR max vel: 2.26 cm2
AV Peak grad: 4.9 mmHg
Ao pk vel: 1.11 m/s
Area-P 1/2: 2.6 cm2
Calc EF: 43.2 %
Height: 69 in
MV M vel: 4.71 m/s
MV Peak grad: 88.7 mmHg
S' Lateral: 4.4 cm
Single Plane A2C EF: 42.2 %
Single Plane A4C EF: 43.4 %
Weight: 2980.62 oz

## 2022-03-08 LAB — CBC
HCT: 33.8 % — ABNORMAL LOW (ref 39.0–52.0)
Hemoglobin: 11.2 g/dL — ABNORMAL LOW (ref 13.0–17.0)
MCH: 31.3 pg (ref 26.0–34.0)
MCHC: 33.1 g/dL (ref 30.0–36.0)
MCV: 94.4 fL (ref 80.0–100.0)
Platelets: 252 10*3/uL (ref 150–400)
RBC: 3.58 MIL/uL — ABNORMAL LOW (ref 4.22–5.81)
RDW: 15.2 % (ref 11.5–15.5)
WBC: 9 10*3/uL (ref 4.0–10.5)
nRBC: 0 % (ref 0.0–0.2)

## 2022-03-08 LAB — BASIC METABOLIC PANEL
Anion gap: 9 (ref 5–15)
BUN: 23 mg/dL (ref 8–23)
CO2: 26 mmol/L (ref 22–32)
Calcium: 9.7 mg/dL (ref 8.9–10.3)
Chloride: 104 mmol/L (ref 98–111)
Creatinine, Ser: 2.7 mg/dL — ABNORMAL HIGH (ref 0.61–1.24)
GFR, Estimated: 24 mL/min — ABNORMAL LOW (ref >60)
Glucose, Bld: 112 mg/dL — ABNORMAL HIGH (ref 70–99)
Potassium: 3.7 mmol/L (ref 3.5–5.1)
Sodium: 139 mmol/L (ref 135–145)

## 2022-03-08 LAB — APTT
aPTT: 64 seconds — ABNORMAL HIGH (ref 24–36)
aPTT: 92 seconds — ABNORMAL HIGH (ref 24–36)

## 2022-03-08 LAB — TROPONIN I (HIGH SENSITIVITY): Troponin I (High Sensitivity): 2636 ng/L (ref <18)

## 2022-03-08 LAB — HEPARIN LEVEL (UNFRACTIONATED)
Heparin Unfractionated: >1.1 IU/mL — ABNORMAL HIGH (ref 0.30–0.70)
Heparin Unfractionated: >1.1 IU/mL — ABNORMAL HIGH (ref 0.30–0.70)

## 2022-03-08 LAB — LIPASE, BLOOD: Lipase: 71 U/L — ABNORMAL HIGH (ref 11–51)

## 2022-03-08 NOTE — Progress Notes (Signed)
Patient refused CPAP for the night  

## 2022-03-08 NOTE — Progress Notes (Signed)
?PROGRESS NOTE ? ? ? ?Joe Wiggins  HBZ:[id number] DOB: 05/03/1946 DOA: 03/07/2022 ?PCP: Loura Pardon, MD ? ? ?Brief Narrative:  ?Joe Wiggins is a 76 y.o. 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 Cardiology consulted, hospitalist called to admit. ? ?Assessment & Plan: ?  ?Principal Problem: ?  Acute on chronic combined systolic and diastolic CHF (congestive heart failure) ([place]) ?Active Problems: ?  Paroxysmal atrial fibrillation ([place]) ?  CKD (chronic kidney disease) stage 4, GFR 15-29 ml/min (HCC) ?  COPD (chronic obstructive pulmonary disease) ([place]) ?  Cardiac pacemaker in situ ?  NSTEMI (non-ST elevated myocardial infarction) ([place]) ? ?Acute on chronic combined systolic and diastolic CHF decompensation ?-Given orthopnea and lower extremity edema and hypoxia ?-Cardiology following ?-Continue Lasix, follow I's and O's ? ?NSTEMI ?-Likely secondary to above  ?-History of multivessel CAD, most recent cath in 2019 showed multivessel CAD 75% - 100% stenosis including mid LM, OST LAD-proximal LAD, OST first marginal, proximal Cx to mid Cx and proximal RCA to mid RCA. ?-Cardiology following, continue aspirin, heparin drip, continue beta-blocker and statin.   ?-Hold ACE/ARB due to CKD. ?  ?HTN, uncontrolled ?- Continue labetalol, PRN hydralazine. ?  ?Questionable AKI on CKD stage IV ?-Creatinine level slowly increasing over the past year, 2.1-2.3 ?-Creatinine 2.5-2.7 at intake, unclear if new baseline or acute injury, follow repeat labs while on diuresis given above ?  ?PAF ?-Currently in sinus rhythm ?-Last dose of Eliquis 3/13 AM ?-Continue heparin drip ?  ?COPD ?-Stable, no symptoms or signs of exacerbation, no indication for steroids ?-Continue nebs, inhalers ?  ?HLD -continue statin, high  intensity ?BPH -Continue Flomax ?Left lower lobe lung nodule-Chronic, continue to follow outpatient CT. ?  ?DVT prophylaxis: Heparin drip ?Code Status: Full code ?Family Communication: Wife at bedside ? ?Status is: Inpatient ? ?Dispo: The patient is from: Home ?             Anticipated d/c is to: To be determined ?             Anticipated d/c date is: 48 to 72 hours ?             Patient currently not medically stable for discharge ? ?Consultants:  ?Cardiology ? ?Procedures:  ?None ? ?Antimicrobials:  ?None ? ?Subjective: ?No acute issues or events overnight, orthopnea and dyspnea with exertion ongoing but improving from intake.  Otherwise denies nausea vomiting diarrhea constipation headache fevers chills or chest pain ? ?Objective: ?Vitals:  ? 03/07/22 2048 03/07/22 2247 03/08/22 0049 03/08/22 0429  ?BP: (!) 159/77  (!) 141/54 (!) 156/70  ?Pulse: 76 80 74 66  ?Resp: 20 (!) 24 18 20   ?Temp: 98 ?F (36.7 ?C)  98 ?F (36.7 ?C) 98.1 ?F (36.7 ?C)  ?TempSrc: Oral   Oral  ?SpO2: 97% 96% 94% 100%  ?Weight:      ?Height:      ? ? ?Intake/Output Summary (Last 24 hours) at 03/08/2022 0747 ?Last data filed at 03/08/2022 0559 ?Gross per 24 hour  ?Intake 109.19 ml  ?Output 600 ml  ?Net -490.81 ml  ? ?Filed Weights  ? 03/07/22 2000  ?Weight: 84.5 kg  ? ? ?Examination: ? ?General:  Pleasantly resting in bed, No acute distress. ?Lungs: Bibasilar rales without overt wheeze or rhonchi. ?Heart:  Regular rate  and rhythm.  Without murmurs, rubs, or gallops. ?Abdomen:  Soft, nontender, nondistended.  Without guarding or rebound. ?Extremities: Scant 1+ pitting edema bilateral lower extremities. ? ?Data Reviewed: I have personally reviewed following labs and imaging studies ? ?CBC: ?Recent Labs  ?Lab 03/07/22 ?1228 03/07/22 ?1450 03/08/22 ?0117  ?WBC 7.7  --  9.0  ?NEUTROABS 5.4  --   --   ?HGB 12.0* 10.5* 11.2*  ?HCT 36.3* 31.0* 33.8*  ?MCV 95.3  --  94.4  ?PLT 253  --  252  ? ?Basic Metabolic Panel: ?Recent Labs  ?Lab 03/07/22 ?1228  03/07/22 ?1450 03/08/22 ?0117  ?NA 137 137 139  ?K 3.9 4.2 3.7  ?CL 101 101 104  ?CO2 27  --  26  ?GLUCOSE 116* 107* 112*  ?BUN 22 28* 23  ?CREATININE 2.54* 2.70* 2.70*  ?CALCIUM 9.8  --  9.7  ?MG 2.1  --   --   ? ?GFR: ?Estimated Creatinine Clearance: 23.3 mL/min (A) (by C-G formula based on SCr of 2.7 mg/dL (H)). ?Liver Function Tests: ?Recent Labs  ?Lab 03/07/22 ?1228  ?AST 23  ?ALT 19  ?ALKPHOS 137*  ?BILITOT 0.5  ?PROT 7.1  ?ALBUMIN 3.9  ? ?Recent Labs  ?Lab 03/07/22 ?1228 03/08/22 ?0117  ?LIPASE 74* 71*  ? ?No results for input(s): AMMONIA in the last 168 hours. ?Coagulation Profile: ?No results for input(s): INR, PROTIME in the last 168 hours. ?Cardiac Enzymes: ?No results for input(s): CKTOTAL, CKMB, CKMBINDEX, TROPONINI in the last 168 hours. ?BNP (last 3 results) ?No results for input(s): PROBNP in the last 8760 hours. ?HbA1C: ?No results for input(s): HGBA1C in the last 72 hours. ?CBG: ?Recent Labs  ?Lab 03/07/22 ?2104  ?GLUCAP 108*  ? ?Lipid Profile: ?No results for input(s): CHOL, HDL, LDLCALC, TRIG, CHOLHDL, LDLDIRECT in the last 72 hours. ?Thyroid Function Tests: ?Recent Labs  ?  03/07/22 ?1454  ?TSH 3.023  ? ?Anemia Panel: ?No results for input(s): VITAMINB12, FOLATE, FERRITIN, TIBC, IRON, RETICCTPCT in the last 72 hours. ?Sepsis Labs: ?No results for input(s): PROCALCITON, LATICACIDVEN in the last 168 hours. ? ?Recent Results (from the past 240 hour(s))  ?Resp Panel by RT-PCR (Flu A&B, Covid) Nasopharyngeal Swab     Status: None  ? Collection Time: 03/07/22 12:35 PM  ? Specimen: Nasopharyngeal Swab; Nasopharyngeal(NP) swabs in vial transport medium  ?Result Value Ref Range Status  ? SARS Coronavirus 2 by RT PCR NEGATIVE NEGATIVE Final  ?  Comment: (NOTE) ?SARS-CoV-2 target nucleic acids are NOT DETECTED. ? ?The SARS-CoV-2 RNA is generally detectable in upper respiratory ?specimens during the acute phase of infection. The lowest ?concentration of SARS-CoV-2 viral copies this assay can detect is ?138  copies/mL. A negative result does not preclude SARS-Cov-2 ?infection and should not be used as the sole basis for treatment or ?other patient management decisions. A negative result may occur with  ?improper specimen collection/handling, submission of specimen other ?than nasopharyngeal swab, presence of viral mutation(s) within the ?areas targeted by this assay, and inadequate number of viral ?copies(<138 copies/mL). A negative result must be combined with ?clinical observations, patient history, and epidemiological ?information. The expected result is Negative. ? ?Fact Sheet for Patients:  ?EntrepreneurPulse.com.au ? ?Fact Sheet for Healthcare Providers:  ?IncredibleEmployment.be ? ?This test is no t yet approved or cleared by the [place] FDA and  ?has been authorized for detection and/or diagnosis of SARS-CoV-2 by ?FDA under an Emergency Use Authorization (EUA). This EUA will remain  ?in effect (meaning this test can be  used) for the duration of the ?COVID-19 declaration under Section 564(b)(1) of the Act, 21 ?U.S.C.section 360bbb-3(b)(1), unless the authorization is terminated  ?or revoked sooner.  ? ? ?  ? Influenza A by PCR NEGATIVE NEGATIVE Final  ? Influenza B by PCR NEGATIVE NEGATIVE Final  ?  Comment: (NOTE) ?The Xpert Xpress SARS-CoV-2/FLU/RSV plus assay is intended as an aid ?in the diagnosis of influenza from Nasopharyngeal swab specimens and ?should not be used as a sole basis for treatment. Nasal washings and ?aspirates are unacceptable for Xpert Xpress SARS-CoV-2/FLU/RSV ?testing. ? ?Fact Sheet for Patients: ?EntrepreneurPulse.com.au ? ?Fact Sheet for Healthcare Providers: ?IncredibleEmployment.be ? ?This test is not yet approved or cleared by the [place] FDA and ?has been authorized for detection and/or diagnosis of SARS-CoV-2 by ?FDA under an Emergency Use Authorization (EUA). This EUA will remain ?in effect (meaning  this test can be used) for the duration of the ?COVID-19 declaration under Section 564(b)(1) of the Act, 21 U.S.C. ?section 360bbb-3(b)(1), unless the authorization is terminated or ?revoked. ? ?Performed at [facility]

## 2022-03-08 NOTE — Progress Notes (Signed)
? ?Progress Note ? ?[name] ?Date of Encounter: 03/08/2022 ? ?[facility] Cardiologist: Donato Heinz, MD  ? ?Subjective  ? ?Patient denies any episodes of chest pain overnight or this morning. Has dyspnea when laying on his back. Reports that he has noticed increased dyspnea on exertion recently. Occasionally gets swelling in his ankles, no swelling today. No palpitations.  ? ?Inpatient Medications  ?  ?Scheduled Meds: ? aspirin EC  81 mg Oral Daily  ? atorvastatin  40 mg Oral Daily  ? ferrous sulfate  325 mg Oral Q breakfast  ? fluticasone furoate-vilanterol  1 puff Inhalation Daily  ? And  ? umeclidinium bromide  1 puff Inhalation Daily  ? folic acid  096 mcg Oral Daily  ? furosemide  40 mg Intravenous BID  ? labetalol  300 mg Oral BID  ? loratadine  10 mg Oral Daily  ? tamsulosin  0.4 mg Oral QPM  ? ?Continuous Infusions: ? heparin 1,350 Units/hr (03/08/22 0433)  ? ?PRN Meds: ?acetaminophen, albuterol, hydrALAZINE, nitroGLYCERIN  ? ?Vital Signs  ?  ?Vitals:  ? 03/07/22 2247 03/08/22 0049 03/08/22 0429 03/08/22 0753  ?BP:  (!) 141/54 (!) 156/70 (!) 157/72  ?Pulse: 80 74 66 68  ?Resp: (!) _0 ?Temp:  98 ?F (36.7 ?C) 98.1 ?F (36.7 ?C) 98.7 ?F (37.1 ?C)  ?TempSrc:   Oral Oral  ?SpO2: 96% 94% 100% 99%  ?Weight:      ?Height:      ? ? ?Intake/Output Summary (Last 24 hours) at 03/08/2022 0811 ?Last data filed at 03/08/2022 0559 ?Gross per 24 hour  ?Intake 109.19 ml  ?Output 600 ml  ?Net -490.81 ml  ? ?Last 3 Weights 03/07/2022 11/24/2021 11/01/2021  ?Weight (lbs) 186 lb 4.6 oz 184 lb 184 lb 12.8 oz  ?Weight (kg) 84.5 kg 83.462 kg 83.825 kg  ?   ? ?Telemetry  ?  ?Sinus rhythm, occasional PVCs, a 9 beat run of Vtach this morning around 6:37 - Personally Reviewed ? ?ECG  ?  ?Sinus rhythm, LBBB - Personally Reviewed ? ?Physical Exam  ? ?GEN: No acute distress, sitting up in the bed.   ?Neck: No JVD ?Cardiac: RRR, no murmurs, rubs, or gallops. Radial pulses 2+ bilaterally  ?Respiratory:  Clear to auscultation bilaterally, diminished breath sounds in the lung bases.  ?GI: Soft, nontender, non-distended  ?MS: No edema; No deformity. ?Neuro:  Nonfocal  ?Psych: Normal affect  ? ?Labs  ?  ?High Sensitivity Troponin:   ?Recent Labs  ?Lab 03/07/22 ?1228 03/07/22 ?1455 03/07/22 ?2059 03/08/22 ?2836  ?TROPONINIHS 20* 633* 4,110* 2,636*  ?   ?Chemistry ?Recent Labs  ?Lab 03/07/22 ?1228 03/07/22 ?1450 03/08/22 ?0117  ?NA 137 137 139  ?K 3.9 4.2 3.7  ?CL 101 101 104  ?CO2 27  --  26  ?GLUCOSE 116* 107* 112*  ?BUN 22 28* 23  ?CREATININE 2.54* 2.70* 2.70*  ?CALCIUM 9.8  --  9.7  ?MG 2.1  --   --   ?PROT 7.1  --   --   ?ALBUMIN 3.9  --   --   ?AST 23  --   --   ?ALT 19  --   --   ?ALKPHOS 137*  --   --   ?BILITOT 0.5  --   --   ?GFRNONAA 25*  --  24*  ?ANIONGAP 9  --  9  ?  ?Lipids No results for input(s): CHOL, TRIG, HDL, LABVLDL, LDLCALC, CHOLHDL in  the last 168 hours.  ?Hematology ?Recent Labs  ?Lab 03/07/22 ?1228 03/07/22 ?1450 03/08/22 ?0117  ?WBC 7.7  --  9.0  ?RBC 3.81*  --  3.58*  ?HGB 12.0* 10.5* 11.2*  ?HCT 36.3* 31.0* 33.8*  ?MCV 95.3  --  94.4  ?MCH 31.5  --  31.3  ?MCHC 33.1  --  33.1  ?RDW 15.3  --  15.2  ?PLT 253  --  252  ? ?Thyroid  ?Recent Labs  ?Lab 03/07/22 ?1454  ?TSH 3.023  ?  ?BNP ?Recent Labs  ?Lab 03/07/22 ?1228  ?BNP 380.9*  ?  ?DDimer No results for input(s): DDIMER in the last 168 hours.  ? ?Radiology  ?  ?DG Chest 1 View ? ?Result Date: 03/07/2022 ?CLINICAL DATA:  LEFT-sided chest pain in a 76 year old male. EXAM: CHEST  1 VIEW COMPARISON:  January 06, 2021 and May 12, 2021. FINDINGS: Signs of LEFT carotid stenting as before. Power pack over the LEFT chest for 3 lead pacer defibrillator device. Post median sternotomy. Cardiomediastinal contours and hilar structures are stable. Increased interstitial markings are noted at the lung bases more pronounced than on previous imaging. There no pneumothorax. No lobar consolidative process. More nodular density amidst areas of suspected scarring  at the LEFT lung base measuring up to 9 mm On limited assessment there is no acute skeletal finding. IMPRESSION: 1. Increased interstitial markings at the lung bases more pronounced than on previous imaging may represent mild edema or pneumonitis superimposed on chronic scarring. Consider follow-up PA and lateral chest radiograph to ensure resolution. 2. More nodular area amidst chronic changes at the LEFT lung base warrants dedicated attention on subsequent imaging, would consider initial assessment with PA and lateral chest radiograph at 4-6 weeks to ensure resolution. 3. No acute cardiopulmonary process. Electronically Signed   By: Zetta Bills M.D.   On: 03/07/2022 13:10  ? ?DG Chest 2 View ? ?Result Date: 03/07/2022 ?CLINICAL DATA:  Burning sensation left side of chest.  Chest pain. EXAM: CHEST - 2 VIEW COMPARISON:  AP chest earlier same day 03/07/2022, 05/12/2021, 01/06/2021; CT chest 04/14/2013 FINDINGS: Status post median sternotomy. Cardiac AICD leads not significantly changed. Cardiac silhouette is again mildly enlarged. Mild chronic bilateral interstitial thickening is again seen. Mildly increased density overlying the mid to inferior right lung appears unchanged from multiple prior studies and may reflect overlying soft tissues. Nevertheless there is chronic scarring within the bilateral lung bases seen on prior remote 04/14/2013 CT which may reflect chronic bibasilar densities on radiographs. Mild flattening of the diaphragms and hyperinflation unchanged. No definite pleural effusion. No pneumothorax is seen. Mild-to-moderate multilevel degenerative disc changes of the thoracic spine. IMPRESSION: No significant change from prior. Mildly enlarged cardiac silhouette. Chronic left-greater-than-right lower lung scarring is similar to prior radiographs and CT. Electronically Signed   By: Yvonne Kendall M.D.   On: 03/07/2022 18:52   ? ?Cardiac Studies  ? ? ?Patient Profile  ?   ?76 y.o. 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 ? ?Assessment & Plan  ?  ?NSTEMI  ?- Patient has a history of multivessel CAD and CABG x4, most recent recent cath in 2019 showed multivessel CAD 75% - 100% stenosis including mid LM, OST LAD-proximal LAD, OST first marginal, proximal Cx to mid  Cx and proximal RCA to mid RCA. Patent LIMA to the LAD, SVG to first OM and PDA, patent Y graft SVG to the diagonal  ?- Patient presented complaining of an episode of substernal chest pain that radiated to left upper arm, associated with SOB. Relieved with rest.  ?- hsTn 20>>633>>4110>>2636  ?- EKG showed LBBB (chronic)  ?- Patient is on eliquis at home, last dose taken 3/13 AM  ?- On IV heparin  ?- Continue labetalol 300 mg BID  ?- Continue ASA  ?- Continue lipitor  ?- Patient denies any further episodes of chest pain  ?- Ideally would like to avoid cath as patient has poor renal function (Cr 2.7) and would likely require a lot of dye to image/intervene on grafts  ?- Echocardiogram ordered, may need cath if it shows a decrease in EF (patient NPO in case)  ? ?Acute on chronic combined sytolic and diastolic CHF decompensation  ?- Most recent echocardiogram on 12/28/20 showed EF 45-50%, grade II diastolic dysfunction  ?- BNP elevated to 380. Echocardiogram this admission pending   ?- CXR showed increased interstitial markings in the lung bases that may represent mild edema  ?- Started on Lasix 40 mg IV daily (has not yet received any doses). Will monitor urine output, may need a higher dose of lasix due to his poor kidney function  ?- Ordered strict I/Os, daily weights ?- Renal function limits GDMT. No ACE/ARB/ARNI or spiro. eGFR borderline for SGLT2i  ? ?HTN  ?- Continue home labetalol 300 mg BID ?- Follow BP with diuresis, BP elevated prior to starting IV  lasix, may need additional medications  ? ?Paroxysmal atrial fibrillation: telemetry shows predominantly sinus rhythm   ?- Eliquis held, on IV heparin  ?- Continue home labetalol 300 mg BID  ?- EKG on admiss

## 2022-03-08 NOTE — Progress Notes (Signed)
ANTICOAGULATION CONSULT NOTE ? ?Pharmacy Consult for heparin ?Indication: chest pain/ACS ? ?Allergies  ?Allergen Reactions  ? Ipratropium Other (See Comments)  ? Metoprolol   ?  Was bringing blood pressure too low and he would pass out.  ? Morphine And Related Nausea Only  ? Vicodin [Hydrocodone-Acetaminophen] Other (See Comments)  ?  Hallucinations ?  ? Penicillins Rash  ?  Did it involve swelling of the face/tongue/throat, SOB, or low BP? No ?Did it involve sudden or severe rash/hives, skin peeling, or any reaction on the inside of your mouth or nose? Yes ?Did you need to seek medical attention at a hospital or doctor's office? No ?When did it last happen? Over 7 Years Ago      ?If all above answers are "NO", may proceed with cephalosporin use. ? ?  ? ? ?Patient Measurements: ?Height: 5\' 9"  (175.3 cm) ?Weight: 84.5 kg (186 lb 4.6 oz) ?IBW/kg (Calculated) : 70.7 ?Heparin Dosing Weight: 85 kg ? ?Vital Signs: ?Temp: 98.7 ?F (37.1 ?C) (03/14 0753) ?Temp Source: Oral (03/14 0753) ?BP: 157/72 (03/14 0753) ?Pulse Rate: 68 (03/14 0753) ? ?Labs: ?Recent Labs  ?  03/07/22 ?1228 03/07/22 ?1450 03/07/22 ?1455 03/07/22 ?2059 03/08/22 ?7209 03/08/22 ?4709  ?HGB 12.0* 10.5*  --   --  11.2*  --   ?HCT 36.3* 31.0*  --   --  33.8*  --   ?PLT 253  --   --   --  252  --   ?APTT  --   --   --   --  64*  --   ?HEPARINUNFRC  --   --   --   --  >1.10*  --   ?CREATININE 2.54* 2.70*  --   --  2.70*  --   ?TROPONINIHS 20*  --  633* 4,110*  --  2,636*  ? ? ? ?Estimated Creatinine Clearance: 23.3 mL/min (A) (by C-G formula based on SCr of 2.7 mg/dL (H)). ? ? ?Medical History: ?Past Medical History:  ?Diagnosis Date  ? Abrasion of forearm, right 07/27/2019  ? Acute respiratory failure ([place])   ? AF (paroxysmal atrial fibrillation) ([place])   ? AICD (automatic cardioverter/defibrillator) present   ? CAD (coronary artery disease)   ? Cardiomyopathy, ischemic   ? Carotid artery occlusion   ? Chronic kidney disease   ? COPD (chronic obstructive  pulmonary disease) ([place])   ? Cough 11/13/2019  ? Dementia without behavioral disturbance ([place])   ? Dizziness 01/17/2016  ? Facial rash 08/15/2018  ? GERD (gastroesophageal reflux disease)   ? Hyperlipidemia   ? Hypertension   ? Pre-syncope 01/16/2018  ? Respiratory distress 12/27/2020  ? Sleep apnea   ? Stroke [facility])   ? TIA (transient ischemic attack)   ? ? ?Assessment: ?76 yo M on apixaban PTA for afib. Last dose 3/13 @ 9AM. Patient admitted for chest pain with elevated troponins and PMH of carotid artery stenosis, HLD, dementia, CHF, CKD, CAD, OSA, TIA, CVA, anemia, COPD, previous cardiac arrest, and sinus node dysfunction s/p pacemaker placement. Pharmacy consulted for heparin.   ? ?Weight not yet entered in ED. Recent weights around 184-188 lb. Will use heparin dosing weight of 85 kg. Scr is elevated.  ? ?Heparin level still elevated >1 due to recent apixaban use. Aptt is at upper end of therapeutic at 92s. No bleeding issues noted. Hgb stable at 11.2 overnight.  ? ?Goal of Therapy:  ?Heparin level 0.3-0.7 units/ml ?aPTT 66-102 secs ?Monitor platelets by anticoagulation protocol: Yes ?  ?  Plan:  ?Continue heparin at 1350 units/hr ?Monitor for signs/symptoms of bleeding  ?F/u cardiac work up and restart apixaban  ? ?Erin Hearing PharmD., BCPS ?Clinical Pharmacist ?03/08/2022 8:55 AM ? ?

## 2022-03-08 NOTE — Care Management (Signed)
?  Transition of Care (TOC) Screening Note ? ? ?Patient Details  ?Name: Joe Wiggins ?Date of Birth: 14-Jul-1946 ? ? ?Transition of Care (TOC) CM/SW Contact:    ?Carles Collet, RN ?Phone Number: ?03/08/2022, 8:21 AM ? ? ? ?Transition of Care Department [facility]) has reviewed patient and we will continue to monitor patient advancement through interdisciplinary progression rounds.  ?

## 2022-03-08 NOTE — Progress Notes (Signed)
ANTICOAGULATION CONSULT NOTE ? ?Pharmacy Consult for heparin ?Indication: chest pain/ACS ? ?Allergies  ?Allergen Reactions  ? Ipratropium Other (See Comments)  ? Metoprolol   ?  Was bringing blood pressure too low and he would pass out.  ? Morphine And Related Nausea Only  ? Vicodin [Hydrocodone-Acetaminophen] Other (See Comments)  ?  Hallucinations ?  ? Penicillins Rash  ?  Did it involve swelling of the face/tongue/throat, SOB, or low BP? No ?Did it involve sudden or severe rash/hives, skin peeling, or any reaction on the inside of your mouth or nose? Yes ?Did you need to seek medical attention at a hospital or doctor's office? No ?When did it last happen? Over 7 Years Ago      ?If all above answers are "NO", may proceed with cephalosporin use. ? ?  ? ? ?Patient Measurements: ?  ?Heparin Dosing Weight: 85 kg ? ?Vital Signs: ?Temp: 98 ?F (36.7 ?C) (03/14 0049) ?Temp Source: Oral (03/13 2048) ?BP: 141/54 (03/14 0049) ?Pulse Rate: 74 (03/14 0049) ? ?Labs: ?Recent Labs  ?  03/07/22 ?1228 03/07/22 ?1450 03/07/22 ?1455 03/07/22 ?2059 03/08/22 ?0117  ?HGB 12.0* 10.5*  --   --  11.2*  ?HCT 36.3* 31.0*  --   --  33.8*  ?PLT 253  --   --   --  252  ?APTT  --   --   --   --  64*  ?HEPARINUNFRC  --   --   --   --  >1.10*  ?CREATININE 2.54* 2.70*  --   --  2.70*  ?TROPONINIHS 20*  --  633* 4,110*  --   ? ? ? ?CrCl cannot be calculated (Unknown ideal weight.). ? ? ?Medical History: ?Past Medical History:  ?Diagnosis Date  ? Abrasion of forearm, right 07/27/2019  ? Acute respiratory failure ([place])   ? AF (paroxysmal atrial fibrillation) ([place])   ? AICD (automatic cardioverter/defibrillator) present   ? CAD (coronary artery disease)   ? Cardiomyopathy, ischemic   ? Carotid artery occlusion   ? Chronic kidney disease   ? COPD (chronic obstructive pulmonary disease) ([place])   ? Cough 11/13/2019  ? Dementia without behavioral disturbance ([place])   ? Dizziness 01/17/2016  ? Facial rash 08/15/2018  ? GERD (gastroesophageal reflux disease)   ?  Hyperlipidemia   ? Hypertension   ? Pre-syncope 01/16/2018  ? Respiratory distress 12/27/2020  ? Sleep apnea   ? Stroke [facility])   ? TIA (transient ischemic attack)   ? ?  ? ?Assessment: ?76 yo M on apixaban PTA for afib. Last dose 3/13 @ 9AM. Patient admitted for chest pain with elevated troponins and PMH of carotid artery stenosis, HLD, dementia, CHF, CKD, CAD, OSA, TIA, CVA, anemia, COPD, previous cardiac arrest, and sinus node dysfunction s/p pacemaker placement. Pharmacy consulted for heparin.   ? ?Weight not yet entered in ED. Recent weights around 184-188 lb. Will use heparin dosing weight of 85 kg. Scr is elevated.  ? ?3/14 AM update:  ?aPTT low ? ?Goal of Therapy:  ?Heparin level 0.3-0.7 units/ml ?aPTT 66-102 secs ?Monitor platelets by anticoagulation protocol: Yes ?  ?Plan:  ?Inc heparin to 1350 units/hr ?1200 heparin level and aPTT ?Monitor for signs/symptoms of bleeding  ?F/u cardiac work up and restart apixaban  ? ?Narda Bonds, PharmD, BCPS ?Clinical Pharmacist ?Phone: [phone] ? ? ?

## 2022-03-09 DIAGNOSIS — I5043 Acute on chronic combined systolic (congestive) and diastolic (congestive) heart failure: Secondary | ICD-10-CM | POA: Diagnosis not present

## 2022-03-09 LAB — APTT: aPTT: 79 seconds — ABNORMAL HIGH (ref 24–36)

## 2022-03-09 LAB — CBC
HCT: 32.9 % — ABNORMAL LOW (ref 39.0–52.0)
Hemoglobin: 10.8 g/dL — ABNORMAL LOW (ref 13.0–17.0)
MCH: 31.3 pg (ref 26.0–34.0)
MCHC: 32.8 g/dL (ref 30.0–36.0)
MCV: 95.4 fL (ref 80.0–100.0)
Platelets: 246 10*3/uL (ref 150–400)
RBC: 3.45 MIL/uL — ABNORMAL LOW (ref 4.22–5.81)
RDW: 15.4 % (ref 11.5–15.5)
WBC: 7 10*3/uL (ref 4.0–10.5)
nRBC: 0 % (ref 0.0–0.2)

## 2022-03-09 LAB — LIPID PANEL
Cholesterol: 144 mg/dL (ref 0–200)
HDL: 30 mg/dL — ABNORMAL LOW (ref >40)
LDL Cholesterol: 83 mg/dL (ref 0–99)
Total CHOL/HDL Ratio: 4.8 RATIO
Triglycerides: 157 mg/dL — ABNORMAL HIGH (ref <150)
VLDL: 31 mg/dL (ref 0–40)

## 2022-03-09 LAB — BASIC METABOLIC PANEL
Anion gap: 11 (ref 5–15)
BUN: 27 mg/dL — ABNORMAL HIGH (ref 8–23)
CO2: 27 mmol/L (ref 22–32)
Calcium: 9.4 mg/dL (ref 8.9–10.3)
Chloride: 99 mmol/L (ref 98–111)
Creatinine, Ser: 2.82 mg/dL — ABNORMAL HIGH (ref 0.61–1.24)
GFR, Estimated: 22 mL/min — ABNORMAL LOW (ref >60)
Glucose, Bld: 101 mg/dL — ABNORMAL HIGH (ref 70–99)
Potassium: 3.7 mmol/L (ref 3.5–5.1)
Sodium: 137 mmol/L (ref 135–145)

## 2022-03-09 LAB — HEPARIN LEVEL (UNFRACTIONATED): Heparin Unfractionated: >1.1 IU/mL — ABNORMAL HIGH (ref 0.30–0.70)

## 2022-03-09 MED ORDER — APIXABAN 5 MG PO TABS
5.0000 mg | ORAL_TABLET | Freq: Two times a day (BID) | ORAL | Status: DC
Start: 2022-03-09 — End: 2022-03-11
  Administered 2022-03-09 – 2022-03-11 (×4): 5 mg via ORAL
  Filled 2022-03-09 (×4): qty 1

## 2022-03-09 MED ORDER — ATORVASTATIN CALCIUM 80 MG PO TABS
80.0000 mg | ORAL_TABLET | Freq: Every day | ORAL | Status: DC
  Administered 2022-03-10 – 2022-03-11 (×2): 80 mg via ORAL
  Filled 2022-03-09 (×2): qty 1

## 2022-03-09 MED ORDER — TORSEMIDE 20 MG PO TABS
80.0000 mg | ORAL_TABLET | Freq: Two times a day (BID) | ORAL | Status: DC
  Administered 2022-03-09: 80 mg via ORAL
  Filled 2022-03-09: qty 4

## 2022-03-09 NOTE — Progress Notes (Addendum)
?PROGRESS NOTE ? ? ? ?Joe Wiggins  YHC:[id number] DOB: [birth year] DOA: 03/07/2022 ?PCP: Loura Pardon, MD  ?Joe Wiggins is a 76 y.o. 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 ? ?Subjective: ?-Breathing better, denies any shortness of breath at this time, had some dizziness earlier ? ?Assessment & Plan: ? ?Acute on chronic combined systolic and diastolic CHF decompensation ?-Presented with orthopnea and lower extremity edema and hypoxia ?-ECHo w/ EF 40-45% ?-Diuresed with IV Lasix, he is 3.2 L negative ?-Clinically appears close to euvolemic, cardiology following, transition to oral torsemide today ?-BMP in a.m. ?  ?NSTEMI ?-History of multivessel CAD, most recent cath in 2019 showed multivessel CAD 75% - 100% stenosis including mid LM, OST LAD-proximal LAD, OST first marginal, proximal Cx to mid Cx and proximal RCA to mid RCA. ?-Cardiology following, continue aspirin, heparin drip-till tonight, continue beta-blocker and statin.   ?-Plan for medical management only given advanced CKD 4 ?-Increase activity, PT eval ?  ?HTN, uncontrolled ?- Continue labetalol, PRN hydralazine. ?  ?CKD stage IV ?-Baseline creatinine around 2.1-2.3 ?-Now 2.5-2.7 range, likely cardiorenal ?-Followed by nephrology Dr. Joelyn Oms, will need close follow-up, diuretics as above ?  ?PAF ?-Currently in sinus rhythm ?-Per cardiology continue heparin GTT till tonight ?  ?COPD ?-Stable, no symptoms or signs of exacerbation, no indication for steroids ?-Continue nebs, inhalers ?  ?HLD -continue statin, high intensity ? ?BPH -Continue Flomax ? ?Left lower lobe lung nodule-Chronic, possibly scarring, would  need further work-up ?  ?DVT prophylaxis: Heparin drip ?Code Status: Full code ?Family Communication: Wife at bedside ?Disposition Plan: Home in 1 to 2 days ? ?Consultants:  ?Cards ? ?Procedures:  ? ?Antimicrobials:  ? ? ?Objective: ?Vitals:  ? 03/08/22 2321 03/09/22 0400 03/09/22 0442 03/09/22 0813  ?BP: (!) 157/59  (!) 128/97 (!) 143/64  ?Pulse: 65  69 80  ?Resp: 18  16 20   ?Temp: 97.9 ?F (36.6 ?C)  97.9 ?F (36.6 ?C) 98.1 ?F (36.7 ?C)  ?TempSrc: Oral  Oral Oral  ?SpO2: 100%  100% 98%  ?Weight:  80.3 kg    ?Height:      ? ? ?Intake/Output Summary (Last 24 hours) at 03/09/2022 1209 ?Last data filed at 03/09/2022 0000 ?Gross per 24 hour  ?Intake 328.58 ml  ?Output 600 ml  ?Net -271.42 ml  ? ?Filed Weights  ? 03/07/22 2000 03/09/22 0400  ?Weight: 84.5 kg 80.3 kg  ? ? ?Examination: ? ?General exam: Appears calm and comfortable  ?Respiratory system: Clear to auscultation ?Cardiovascular system: S1 & S2 heard, RRR.  ?Abd: nondistended, soft and nontender.Normal bowel sounds heard. ?Central nervous system: Alert and oriented. No focal neurological deficits. ?Extremities: trace edema ?Skin: No rashes ?Psychiatry: Judgement and insight appear normal. Mood & affect appropriate.  ? ? ? ?Data Reviewed:  ? ?CBC: ?Recent Labs  ?Lab 03/07/22 ?1228 03/07/22 ?1450 03/08/22 ?5176 03/09/22 ?0710  ?WBC 7.7  --  9.0 7.0  ?NEUTROABS 5.4  --   --   --   ?HGB 12.0* 10.5* 11.2* 10.8*  ?HCT 36.3* 31.0* 33.8* 32.9*  ?MCV 95.3  --  94.4 95.4  ?PLT 253  --  252 246  ? ?Basic Metabolic Panel: ?Recent Labs  ?Lab  03/07/22 ?1228 03/07/22 ?1450 03/08/22 ?0981 03/09/22 ?0710  ?NA 137 137 139 137  ?K 3.9 4.2 3.7 3.7  ?CL 101 101 104 99  ?CO2 27  --  26 27  ?GLUCOSE 116* 107* 112* 101*  ?BUN 22 28* 23 27*  ?CREATININE 2.54* 2.70* 2.70* 2.82*  ?CALCIUM 9.8  --  9.7 9.4  ?MG 2.1  --   --   --   ? ?GFR: ?Estimated Creatinine Clearance: 22.3 mL/min (A) (by C-G formula based on SCr of 2.82 mg/dL (H)). ?Liver Function Tests: ?Recent Labs  ?Lab 03/07/22 ?1228   ?AST 23  ?ALT 19  ?ALKPHOS 137*  ?BILITOT 0.5  ?PROT 7.1  ?ALBUMIN 3.9  ? ?Recent Labs  ?Lab 03/07/22 ?1228 03/08/22 ?0117  ?LIPASE 74* 71*  ? ?No results for input(s): AMMONIA in the last 168 hours. ?Coagulation Profile: ?No results for input(s): INR, PROTIME in the last 168 hours. ?Cardiac Enzymes: ?No results for input(s): CKTOTAL, CKMB, CKMBINDEX, TROPONINI in the last 168 hours. ?BNP (last 3 results) ?No results for input(s): PROBNP in the last 8760 hours. ?HbA1C: ?No results for input(s): HGBA1C in the last 72 hours. ?CBG: ?Recent Labs  ?Lab 03/07/22 ?2104  ?GLUCAP 108*  ? ?Lipid Profile: ?Recent Labs  ?  03/09/22 ?0710  ?CHOL 144  ?HDL 30*  ?[place] 83  ?TRIG 157*  ?CHOLHDL 4.8  ? ?Thyroid Function Tests: ?Recent Labs  ?  03/07/22 ?1454  ?TSH 3.023  ? ?Anemia Panel: ?No results for input(s): VITAMINB12, FOLATE, FERRITIN, TIBC, IRON, RETICCTPCT in the last 72 hours. ?Urine analysis: ?   ?Component Value Date/Time  ? [place] YELLOW 01/17/2016 1519  ? APPEARANCEUR CLEAR 01/17/2016 1519  ? LABSPEC 1.015 01/17/2016 1519  ? PHURINE 7.0 01/17/2016 1519  ? GLUCOSEU NEGATIVE 01/17/2016 1519  ? [place] NEGATIVE 01/17/2016 1519  ? [place] NEGATIVE 01/17/2016 1519  ? [place] NEGATIVE 01/17/2016 1519  ? [place] NEGATIVE 01/17/2016 1519  ? NITRITE NEGATIVE 01/17/2016 1519  ? LEUKOCYTESUR NEGATIVE 01/17/2016 1519  ? ?Sepsis Labs: ?@[email or web address] (procalcitonin:4,lacticidven:4) ? ?) ?Recent Results (from the past 240 hour(s))  ?Resp Panel by RT-PCR (Flu A&B, Covid) Nasopharyngeal Swab     Status: None  ? Collection Time: 03/07/22 12:35 PM  ? Specimen: Nasopharyngeal Swab; Nasopharyngeal(NP) swabs in vial transport medium  ?Result Value Ref Range Status  ? SARS Coronavirus 2 by RT PCR NEGATIVE NEGATIVE Final  ?  Comment: (NOTE) ?SARS-CoV-2 target nucleic acids are NOT DETECTED. ? ?The SARS-CoV-2 RNA is generally detectable in upper respiratory ?specimens during the acute phase of infection. The lowest ?concentration of  SARS-CoV-2 viral copies this assay can detect is ?138 copies/mL. A negative result does not preclude SARS-Cov-2 ?infection and should not be used as the sole basis for treatment or ?other patient management decisions. A negative result may occur with  ?improper specimen collection/handling, submission of specimen other ?than nasopharyngeal swab, presence of viral mutation(s) within the ?areas targeted by this assay, and inadequate number of viral ?copies(<138 copies/mL). A negative result must be combined with ?clinical observations, patient history, and epidemiological ?information. The expected result is Negative. ? ?Fact Sheet for Patients:  ?EntrepreneurPulse.com.au ? ?Fact Sheet for Healthcare Providers:  ?IncredibleEmployment.be ? ?This test is no t yet approved or cleared by the [place] FDA and  ?has been authorized for detection and/or diagnosis of SARS-CoV-2 by ?FDA under an Emergency Use Authorization (EUA). This EUA will remain  ?in effect (meaning this test can be used) for the duration of the ?COVID-19 declaration under Section  564(b)(1) of the Act, 21 ?U.S.C.section 360bbb-3(b)(1), unless the authorization is terminated  ?or revoked sooner.  ? ? ?  ? Influenza A by PCR NEGATIVE NEGATIVE Final  ? Influenza B by PCR NEGATIVE NEGATIVE Final  ?  Comment: (NOTE) ?The Xpert Xpress SARS-CoV-2/FLU/RSV plus assay is intended as an aid ?in the diagnosis of influenza from Nasopharyngeal swab specimens and ?should not be used as a sole basis for treatment. Nasal washings and ?aspirates are unacceptable for Xpert Xpress SARS-CoV-2/FLU/RSV ?testing. ? ?Fact Sheet for Patients: ?EntrepreneurPulse.com.au ? ?Fact Sheet for Healthcare Providers: ?IncredibleEmployment.be ? ?This test is not yet approved or cleared by the [place] FDA and ?has been authorized for detection and/or diagnosis of SARS-CoV-2 by ?FDA under an Emergency Use  Authorization (EUA). This EUA will remain ?in effect (meaning this test can be used) for the duration of the ?COVID-19 declaration under Section 564(b)(1) of the Act, 21 U.S.C. ?section 360bbb-3(b)(1), unless the

## 2022-03-09 NOTE — Progress Notes (Signed)
ANTICOAGULATION CONSULT NOTE ? ?Pharmacy Consult for heparin ?Indication: chest pain/ACS ? ?Allergies  ?Allergen Reactions  ? Ipratropium Other (See Comments)  ? Metoprolol   ?  Was bringing blood pressure too low and he would pass out.  ? Morphine And Related Nausea Only  ? Vicodin [Hydrocodone-Acetaminophen] Other (See Comments)  ?  Hallucinations ?  ? Penicillins Rash  ?  Did it involve swelling of the face/tongue/throat, SOB, or low BP? No ?Did it involve sudden or severe rash/hives, skin peeling, or any reaction on the inside of your mouth or nose? Yes ?Did you need to seek medical attention at a hospital or doctor's office? No ?When did it last happen? Over 7 Years Ago      ?If all above answers are "NO", may proceed with cephalosporin use. ? ?  ? ? ?Patient Measurements: ?Height: 5\' 9"  (175.3 cm) ?Weight: 80.3 kg (177 lb) ?IBW/kg (Calculated) : 70.7 ?Heparin Dosing Weight: 85 kg ? ?Vital Signs: ?Temp: 98.1 ?F (36.7 ?C) (03/15 0813) ?Temp Source: Oral (03/15 0813) ?BP: 143/64 (03/15 0813) ?Pulse Rate: 80 (03/15 0813) ? ?Labs: ?Recent Labs  ?  03/07/22 ?1228 03/07/22 ?1450 03/07/22 ?1455 03/07/22 ?2059 03/08/22 ?6948 03/08/22 ?5462 03/08/22 ?1201 03/09/22 ?0710  ?HGB 12.0* 10.5*  --   --  11.2*  --   --  10.8*  ?HCT 36.3* 31.0*  --   --  33.8*  --   --  32.9*  ?PLT 253  --   --   --  252  --   --  246  ?APTT  --   --   --   --  64*  --  92* 79*  ?HEPARINUNFRC  --   --   --   --  >1.10*  --  >1.10* >1.10*  ?CREATININE 2.54* 2.70*  --   --  2.70*  --   --  2.82*  ?TROPONINIHS 20*  --  633* 4,110*  --  2,636*  --   --   ? ? ?Estimated Creatinine Clearance: 22.3 mL/min (A) (by C-G formula based on SCr of 2.82 mg/dL (H)). ? ? ?Medical History: ?Past Medical History:  ?Diagnosis Date  ? Abrasion of forearm, right 07/27/2019  ? Acute respiratory failure ([place])   ? AF (paroxysmal atrial fibrillation) ([place])   ? AICD (automatic cardioverter/defibrillator) present   ? CAD (coronary artery disease)   ? Cardiomyopathy,  ischemic   ? Carotid artery occlusion   ? Chronic kidney disease   ? COPD (chronic obstructive pulmonary disease) ([place])   ? Cough 11/13/2019  ? Dementia without behavioral disturbance ([place])   ? Dizziness 01/17/2016  ? Facial rash 08/15/2018  ? GERD (gastroesophageal reflux disease)   ? Hyperlipidemia   ? Hypertension   ? Pre-syncope 01/16/2018  ? Respiratory distress 12/27/2020  ? Sleep apnea   ? Stroke [facility])   ? TIA (transient ischemic attack)   ? ? ?Assessment: ?76 yo M on apixaban PTA for afib. Last dose 3/13 @ 9AM. Patient admitted for chest pain with elevated troponins and PMH of carotid artery stenosis, HLD, dementia, CHF, CKD, CAD, OSA, TIA, CVA, anemia, COPD, previous cardiac arrest, and sinus node dysfunction s/p pacemaker placement. Pharmacy consulted for heparin.   ? ?Weight not yet entered in ED. Recent weights around 184-188 lb. Will use heparin dosing weight of 85 kg. Scr is elevated.  ? ?Heparin level still elevated >1 due to recent apixaban use. Aptt is at goal 79s. No bleeding issues noted. Hgb stable  at 10.8 overnight.  ? ?Goal of Therapy:  ?Heparin level 0.3-0.7 units/ml ?aPTT 66-102 secs ?Monitor platelets by anticoagulation protocol: Yes ?  ?Plan:  ?Continue heparin at 1350 units/hr ?Monitor for signs/symptoms of bleeding  ?F/u cardiac work up and restart of apixaban  ? ?Erin Hearing PharmD., BCPS ?Clinical Pharmacist ?03/09/2022 11:06 AM ? ?

## 2022-03-09 NOTE — Progress Notes (Signed)
? ?Progress Note ? ?[name] ?Date of Encounter: 03/09/2022 ? ?[facility] Cardiologist: Donato Heinz, MD  ? ?Subjective  ? ?Patient denies any chest pain overnight or this morning, denies palpitations. Breathing has improved and he is able to tolerate laying flat. Denies swelling in ankles.  ? ?Inpatient Medications  ?  ?Scheduled Meds: ? aspirin EC  81 mg Oral Daily  ? atorvastatin  40 mg Oral Daily  ? ferrous sulfate  325 mg Oral Q breakfast  ? fluticasone furoate-vilanterol  1 puff Inhalation Daily  ? And  ? umeclidinium bromide  1 puff Inhalation Daily  ? folic acid  696 mcg Oral Daily  ? furosemide  40 mg Intravenous BID  ? labetalol  300 mg Oral BID  ? loratadine  10 mg Oral Daily  ? tamsulosin  0.4 mg Oral QPM  ? ?Continuous Infusions: ? heparin 1,350 Units/hr (03/08/22 1642)  ? ?PRN Meds: ?acetaminophen, albuterol, hydrALAZINE, nitroGLYCERIN  ? ?Vital Signs  ?  ?Vitals:  ? 03/08/22 2321 03/09/22 0400 03/09/22 0442 03/09/22 0813  ?BP: (!) 157/59  (!) 128/97 (!) 143/64  ?Pulse: 65  69 80  ?Resp: 18  16 20   ?Temp: 97.9 ?F (36.6 ?C)  97.9 ?F (36.6 ?C) 98.1 ?F (36.7 ?C)  ?TempSrc: Oral  Oral Oral  ?SpO2: 100%  100% 98%  ?Weight:  80.3 kg    ?Height:      ? ? ?Intake/Output Summary (Last 24 hours) at 03/09/2022 0903 ?Last data filed at 03/09/2022 0000 ?Gross per 24 hour  ?Intake 328.58 ml  ?Output 600 ml  ?Net -271.42 ml  ? ?Last 3 Weights 03/09/2022 03/07/2022 11/24/2021  ?Weight (lbs) 177 lb 186 lb 4.6 oz 184 lb  ?Weight (kg) 80.287 kg 84.5 kg 83.462 kg  ?   ? ?Telemetry  ?  ?Predominantly sinus rhythm, occasionally atrial paced. One brief episode of atrial fibrillation this AM - Personally Reviewed ? ?ECG  ?  ?Atrial paced rhythm, LBBB, PVC present, LVH  - Personally Reviewed ? ?Physical Exam  ? ?GEN: No acute distress, laying comfortably in the bed.   ?Neck: No JVD ?Cardiac: RRR, no murmurs, rubs, or gallops. Radial pulses 2+ bilaterally  ?Respiratory: Anterior lung exam clear to  auscultation bilaterally. No increase WOB ?GI: Soft, nontender, non-distended  ?MS: No edema; No deformity. ?Neuro:  Nonfocal  ?Psych: Normal affect  ? ?Labs  ?  ?High Sensitivity Troponin:   ?Recent Labs  ?Lab 03/07/22 ?1228 03/07/22 ?1455 03/07/22 ?2059 03/08/22 ?7893  ?TROPONINIHS 20* 633* 4,110* 2,636*  ?   ?Chemistry ?Recent Labs  ?Lab 03/07/22 ?1228 03/07/22 ?1450 03/08/22 ?8101 03/09/22 ?0710  ?NA 137 137 139 137  ?K 3.9 4.2 3.7 3.7  ?CL 101 101 104 99  ?CO2 27  --  26 27  ?GLUCOSE 116* 107* 112* 101*  ?BUN 22 28* 23 27*  ?CREATININE 2.54* 2.70* 2.70* 2.82*  ?CALCIUM 9.8  --  9.7 9.4  ?MG 2.1  --   --   --   ?PROT 7.1  --   --   --   ?ALBUMIN 3.9  --   --   --   ?AST 23  --   --   --   ?ALT 19  --   --   --   ?ALKPHOS 137*  --   --   --   ?BILITOT 0.5  --   --   --   ?GFRNONAA 25*  --  24* 22*  ?ANIONGAP  9  --  9 11  ?  ?Lipids  ?Recent Labs  ?Lab 03/09/22 ?0710  ?CHOL 144  ?TRIG 157*  ?HDL 30*  ?[place] 83  ?CHOLHDL 4.8  ?  ?Hematology ?Recent Labs  ?Lab 03/07/22 ?1228 03/07/22 ?1450 03/08/22 ?5329 03/09/22 ?0710  ?WBC 7.7  --  9.0 7.0  ?RBC 3.81*  --  3.58* 3.45*  ?HGB 12.0* 10.5* 11.2* 10.8*  ?HCT 36.3* 31.0* 33.8* 32.9*  ?MCV 95.3  --  94.4 95.4  ?MCH 31.5  --  31.3 31.3  ?MCHC 33.1  --  33.1 32.8  ?RDW 15.3  --  15.2 15.4  ?PLT 253  --  252 246  ? ?Thyroid  ?Recent Labs  ?Lab 03/07/22 ?1454  ?TSH 3.023  ?  ?BNP ?Recent Labs  ?Lab 03/07/22 ?1228  ?BNP 380.9*  ?  ?DDimer No results for input(s): DDIMER in the last 168 hours.  ? ?Radiology  ?  ?DG Chest 1 View ? ?Result Date: 03/07/2022 ?CLINICAL DATA:  LEFT-sided chest pain in a 76 year old male. EXAM: CHEST  1 VIEW COMPARISON:  January 06, 2021 and May 12, 2021. FINDINGS: Signs of LEFT carotid stenting as before. Power pack over the LEFT chest for 3 lead pacer defibrillator device. Post median sternotomy. Cardiomediastinal contours and hilar structures are stable. Increased interstitial markings are noted at the lung bases more pronounced than on previous  imaging. There no pneumothorax. No lobar consolidative process. More nodular density amidst areas of suspected scarring at the LEFT lung base measuring up to 9 mm On limited assessment there is no acute skeletal finding. IMPRESSION: 1. Increased interstitial markings at the lung bases more pronounced than on previous imaging may represent mild edema or pneumonitis superimposed on chronic scarring. Consider follow-up PA and lateral chest radiograph to ensure resolution. 2. More nodular area amidst chronic changes at the LEFT lung base warrants dedicated attention on subsequent imaging, would consider initial assessment with PA and lateral chest radiograph at 4-6 weeks to ensure resolution. 3. No acute cardiopulmonary process. Electronically Signed   By: Zetta Bills M.D.   On: 03/07/2022 13:10  ? ?DG Chest 2 View ? ?Result Date: 03/07/2022 ?CLINICAL DATA:  Burning sensation left side of chest.  Chest pain. EXAM: CHEST - 2 VIEW COMPARISON:  AP chest earlier same day 03/07/2022, 05/12/2021, 01/06/2021; CT chest 04/14/2013 FINDINGS: Status post median sternotomy. Cardiac AICD leads not significantly changed. Cardiac silhouette is again mildly enlarged. Mild chronic bilateral interstitial thickening is again seen. Mildly increased density overlying the mid to inferior right lung appears unchanged from multiple prior studies and may reflect overlying soft tissues. Nevertheless there is chronic scarring within the bilateral lung bases seen on prior remote 04/14/2013 CT which may reflect chronic bibasilar densities on radiographs. Mild flattening of the diaphragms and hyperinflation unchanged. No definite pleural effusion. No pneumothorax is seen. Mild-to-moderate multilevel degenerative disc changes of the thoracic spine. IMPRESSION: No significant change from prior. Mildly enlarged cardiac silhouette. Chronic left-greater-than-right lower lung scarring is similar to prior radiographs and CT. Electronically Signed   By:  Yvonne Kendall M.D.   On: 03/07/2022 18:52  ? ?ECHOCARDIOGRAM COMPLETE ? ?Result Date: 03/08/2022 ?   ECHOCARDIOGRAM REPORT   Patient Name:   THADDAEUS GRANJA Date of Exam: 03/08/2022 Medical Rec #:  [id number]  Height:       69.0 in Accession #:    [id number] Weight:       186.3 lb Date of Birth:  10-31-1946  BSA:  2.005 m? Patient Age:    79 years   BP:           156/70 mmHg Patient Gender: M          HR:           69 bpm. Exam Location:  Inpatient Procedure: 2D Echo, Cardiac Doppler and Color Doppler Indications:    MI  History:        Patient has prior history of Echocardiogram examinations. CHF,                 CAD, COPD, Arrythmias:Atrial Fibrillation; Risk Factors:Sleep                 Apnea.  Sonographer:    Jyl Heinz Referring Phys: [place]  1. Left ventricular ejection fraction, by estimation, is 40 to 45%. The left ventricle has mildly decreased function. The left ventricle demonstrates regional wall motion abnormalities (see scoring diagram/findings for description). Left ventricular diastolic parameters are consistent with Grade I diastolic dysfunction (impaired relaxation).  2. Right ventricular systolic function is normal. The right ventricular size is normal. There is normal pulmonary artery systolic pressure.  3. Left atrial size was mildly dilated.  4. Right atrial size was mildly dilated.  5. The mitral valve is normal in structure. Trivial mitral valve regurgitation.  6. Tricuspid valve regurgitation is mild to moderate.  7. The aortic valve is tricuspid. Aortic valve regurgitation is not visualized.  8. The inferior vena cava is normal in size with greater than 50% respiratory variability, suggesting right atrial pressure of 3 mmHg. Comparison(s): No significant change from prior study. Prior images reviewed side by side. FINDINGS  Left Ventricle: Left ventricular ejection fraction, by estimation, is 40 to 45%. The left ventricle has mildly decreased function. The left  ventricle demonstrates regional wall motion abnormalities. The left ventricular internal cavity size was normal in size. There is no left ventricular hypertrophy. Abnormal (paradoxical) septal motion, consis

## 2022-03-09 NOTE — Evaluation (Signed)
Physical Therapy Evaluation & Discharge ?Patient Details ?Name: Joe Wiggins ?MRN: [id number] ?DOB: 05/18/1946 ?Today's Date: 03/09/2022 ? ?History of Present Illness ? Pt is a 76 y.o. male admitted 03/07/22 with chest pain, fluid overload. Workup for NSTEMI, acute on chronic CHF. PMH includes dementia, multivessel CAD s/p CABG, PAF on Eliquis, chronic HF, ACID, CKD IV, COPD, HTN, BPH, stroke, gout. ?  ?Clinical Impression ? Patient evaluated by Physical Therapy with no further acute PT needs identified. PTA, pt independent, retired from work, lives with supportive wife who assists with household tasks as needed. Today, pt moving well with supervision for safety; DOE 3/4 and HR 80s-120s afib with activity, HR below. Educ on activity recommendations with CHF. All education has been completed and the patient has no further questions. Acute PT is signing off. Thank you for this referral.    ? ?Recommendations for follow up therapy are one component of a multi-disciplinary discharge planning process, led by the attending physician.  Recommendations may be updated based on patient status, additional functional criteria and insurance authorization. ? ?Follow Up Recommendations No PT follow up ? ?  ?Assistance Recommended at Discharge Intermittent Supervision/Assistance  ?Patient can return home with the following ? Assistance with cooking/housework;Assist for transportation;A little help with bathing/dressing/bathroom ? ?  ?Equipment Recommendations None recommended by PT  ?Recommendations for Other Services ?    ?  ?Functional Status Assessment Patient has not had a recent decline in their functional status  ? ?  ?Precautions / Restrictions Precautions ?Precautions: Fall ?Restrictions ?Weight Bearing Restrictions: No  ? ?  ? ?Mobility ? Bed Mobility ?  ?  ?  ?  ?  ?  ?  ?General bed mobility comments: received sitting in recliner ?  ? ?Transfers ?Overall transfer level: Independent ?Equipment used: None ?Transfers: Sit to/from  Stand ?  ?  ?  ?  ?  ?  ?  ?  ? ?Ambulation/Gait ?Ambulation/Gait assistance: Supervision ?Gait Distance (Feet): 120 Feet ?Assistive device: None ?Gait Pattern/deviations: Step-through pattern, Decreased stride length ?Gait velocity: Decreased ?  ?  ?General Gait Details: Slow, steady gait without DME, supervision for safety; HR 84-120s afib ? ?Stairs ?  ?  ?  ?  ?  ? ?Wheelchair Mobility ?  ? ?Modified Rankin (Stroke Patients Only) ?  ? ?  ? ?Balance Overall balance assessment: No apparent balance deficits (not formally assessed) ?  ?Sitting balance-[name]: Good ?Sitting balance - Comments: unable to don shoes without shoe horn ?  ?Standing balance support: No upper extremity supported, During functional activity ?Standing balance-[name]: Good ?  ?  ?  ?  ?  ?  ?  ?High level balance activites: Side stepping, Direction changes, Turns, Sudden stops, Head turns ?High Level Balance Comments: no overt instability or LOB noted with observed higher level balance tasks ?  ?  ?  ?   ? ? ? ?Pertinent Vitals/Pain Pain Assessment ?Pain Assessment: No/denies pain  ? ? ?Home Living Family/patient expects to be discharged to:: Private residence ?Living Arrangements: Spouse/significant other ?Available Help at Discharge: Family;Available 24 hours/day ?Type of Home: House ?Home Access: Stairs to enter ?Entrance Stairs-Rails: None ?Entrance Stairs-Number of Steps: 2 ?  ?Home Layout: One level ?Home Equipment: Conservation officer, nature (2 wheels);Cane - single point;BSC/3in1;Shower seat;Wheelchair - Psychologist, educational ?   ?  ?Prior Function Prior Level of Function : Independent/Modified Independent ?  ?  ?  ?  ?  ?  ?Mobility Comments: Independent ambulating without DME. Primarily sedentary, enjoys  watching TV. Wife drives. Denies recent falls in past 6 months ?ADLs Comments: Stands to shower; wife typically nearby for supervision if needed with bathing. Wife performs 47 of household tasks ?  ? ? ?Hand Dominance  ?   ? ?   ?Extremity/Trunk Assessment  ? Upper Extremity Assessment ?Upper Extremity Assessment: Overall WFL for tasks assessed ?  ? ?Lower Extremity Assessment ?Lower Extremity Assessment: Overall WFL for tasks assessed ?  ? ?   ?Communication  ? Communication: HOH  ?Cognition Arousal/Alertness: Awake/alert ?Behavior During Therapy: [facility] for tasks assessed/performed ?Overall Cognitive Status: Within Functional Limits for tasks assessed ?  ?  ?  ?  ?  ?  ?  ?  ?  ?  ?  ?  ?  ?  ?  ?  ?  ?  ?  ? ?  ?General Comments General comments (skin integrity, edema, etc.): pt's wife ([name]) present and supportive. educ on activity recommendations with CHF (importance of walking program) ? ?  ?Exercises    ? ?Assessment/Plan  ?  ?PT Assessment Patient does not need any further PT services  ?PT Problem List   ? ?   ?  ?PT Treatment Interventions     ? ?PT Goals (Current goals can be found in the Care Plan section)  ?Acute Rehab PT Goals ?PT Goal Formulation: All assessment and education complete, DC therapy ? ?  ?Frequency   ?  ? ? ?Co-evaluation   ?  ?  ?  ?  ? ? ?  ?AM-PAC PT "6 Clicks" Mobility  ?Outcome Measure Help needed turning from your back to your side while in a flat bed without using bedrails?: None ?Help needed moving from lying on your back to sitting on the side of a flat bed without using bedrails?: None ?Help needed moving to and from a bed to a chair (including a wheelchair)?: None ?Help needed standing up from a chair using your arms (e.g., wheelchair or bedside chair)?: None ?Help needed to walk in hospital room?: A Little ?Help needed climbing 3-5 steps with a railing? : A Little ?6 Click Score: 22 ? ?  ?End of Session   ?Activity Tolerance: Patient tolerated treatment well ?Patient left: in chair;with call bell/phone within reach;with family/visitor present ?Nurse Communication: Mobility status ?PT Visit Diagnosis: Other abnormalities of gait and mobility (R26.89) ?  ? ?Time: 3419-3790 ?PT Time Calculation (min)  (ACUTE ONLY): 14 min ? ? ?Charges:   PT Evaluation ?$PT Eval Moderate Complexity: 1 Mod ?  ?  ?   ?Mabeline Caras, PT, DPT ?Acute Rehabilitation Services  ?Pager [phone] ?Office [phone] ? ?Derry Lory ?03/09/2022, 3:52 PM ? ?

## 2022-03-09 NOTE — Progress Notes (Signed)
Mobility Specialist Progress Note: ? ? 03/09/22 1420  ?Mobility  ?Activity Ambulated with assistance in hallway  ?Level of Assistance Standby assist, set-up cues, supervision of patient - no hands on  ?Assistive Device None  ?Distance Ambulated (ft) 400 ft  ?Activity Response Tolerated well  ?$Mobility charge 1 Mobility  ? ?Pt eager for mobility this afternoon. No physical assist required throughout. X1 standing rest break required d/t SOB, SpO2 86-93% throughout gait on RA. Pt left sitting in chair with all needs met, family member present.  ? ?Nelta Numbers ?Acute Rehab ?Phone: [phone] ?Office Phone: [phone] ? ?

## 2022-03-10 ENCOUNTER — Encounter (HOSPITAL_COMMUNITY): Payer: Self-pay | Admitting: Internal Medicine

## 2022-03-10 DIAGNOSIS — I5043 Acute on chronic combined systolic (congestive) and diastolic (congestive) heart failure: Secondary | ICD-10-CM | POA: Diagnosis not present

## 2022-03-10 LAB — BASIC METABOLIC PANEL
Anion gap: 10 (ref 5–15)
BUN: 30 mg/dL — ABNORMAL HIGH (ref 8–23)
CO2: 22 mmol/L (ref 22–32)
Calcium: 8.9 mg/dL (ref 8.9–10.3)
Chloride: 103 mmol/L (ref 98–111)
Creatinine, Ser: 2.91 mg/dL — ABNORMAL HIGH (ref 0.61–1.24)
GFR, Estimated: 22 mL/min — ABNORMAL LOW (ref >60)
Glucose, Bld: 104 mg/dL — ABNORMAL HIGH (ref 70–99)
Potassium: 3.8 mmol/L (ref 3.5–5.1)
Sodium: 135 mmol/L (ref 135–145)

## 2022-03-10 LAB — CBC
HCT: 30.1 % — ABNORMAL LOW (ref 39.0–52.0)
Hemoglobin: 9.6 g/dL — ABNORMAL LOW (ref 13.0–17.0)
MCH: 30.7 pg (ref 26.0–34.0)
MCHC: 31.9 g/dL (ref 30.0–36.0)
MCV: 96.2 fL (ref 80.0–100.0)
Platelets: 219 10*3/uL (ref 150–400)
RBC: 3.13 MIL/uL — ABNORMAL LOW (ref 4.22–5.81)
RDW: 15.3 % (ref 11.5–15.5)
WBC: 6.7 10*3/uL (ref 4.0–10.5)
nRBC: 0 % (ref 0.0–0.2)

## 2022-03-10 MED ORDER — TORSEMIDE 20 MG PO TABS: 40.0000 mg | ORAL_TABLET | Freq: Two times a day (BID) | ORAL | Status: DC

## 2022-03-10 MED ORDER — TORSEMIDE 20 MG PO TABS
40.0000 mg | ORAL_TABLET | Freq: Two times a day (BID) | ORAL | Status: DC
  Administered 2022-03-10 – 2022-03-11 (×2): 40 mg via ORAL
  Filled 2022-03-10 (×2): qty 2

## 2022-03-10 MED ORDER — TORSEMIDE 20 MG PO TABS
80.0000 mg | ORAL_TABLET | Freq: Two times a day (BID) | ORAL | Status: DC
Start: 2022-03-11 — End: 2022-03-10
  Filled 2022-03-10: qty 4

## 2022-03-10 NOTE — Progress Notes (Signed)
Heart Failure Nurse Navigator Progress Note  ? ?Patient to HF TOC on 03/22/2022 @ 9am.  ? ?Late entry.  ? ? ? ?

## 2022-03-10 NOTE — Progress Notes (Signed)
CARDIAC REHAB PHASE I  ? ?Offered to walk with pt. Pt states ambulation without difficulty. Wife note pt gets SOB and fatigues easy. MI education completed. Pt given MI book and heart healthy diet. Reviewed exercise guidelines and getting purposeful movement most days and monitoring symptoms. Pt and wife deny further questions or concerns at this time. Encouraged continued ambulation with emphasis on safety. Will refer to CRP II [place]. ? ?[id number] ?Rufina Falco, RN BSN ?03/10/2022 ?11:26 AM ? ?

## 2022-03-10 NOTE — Progress Notes (Addendum)
? ?Progress Note ? ?[name] ?Date of Encounter: 03/10/2022 ? ?[facility] Cardiologist: Donato Heinz, MD  ? ?Subjective  ? ?Patient reports some feelings of indigestion/heartburn which is unusual for him. Reports that it feels different than the chest pain that brought him to the hospital. Denies any further episodes of chest pain, SOB, palpitations. Has walked in the halls with PT. Heart rate becomes elevated when he stands up, usually to the 130s.  ? ?Inpatient Medications  ?  ?Scheduled Meds: ? apixaban  5 mg Oral BID  ? aspirin EC  81 mg Oral Daily  ? atorvastatin  80 mg Oral Daily  ? ferrous sulfate  325 mg Oral Q breakfast  ? fluticasone furoate-vilanterol  1 puff Inhalation Daily  ? And  ? umeclidinium bromide  1 puff Inhalation Daily  ? folic acid  382 mcg Oral Daily  ? labetalol  300 mg Oral BID  ? loratadine  10 mg Oral Daily  ? tamsulosin  0.4 mg Oral QPM  ? torsemide  80 mg Oral BID  ? ?Continuous Infusions: ? ?PRN Meds: ?acetaminophen, albuterol, hydrALAZINE, nitroGLYCERIN  ? ?Vital Signs  ?  ?Vitals:  ? 03/09/22 1900 03/09/22 2303 03/10/22 0318 03/10/22 0432  ?BP: 108/87 129/68 (!) 142/61   ?Pulse: 74 77 69   ?Resp: 15 16 19    ?Temp: 97.6 ?F (36.4 ?C) 97.8 ?F (36.6 ?C) 97.8 ?F (36.6 ?C)   ?TempSrc: Oral Oral Oral   ?SpO2: 96% 94% 96%   ?Weight:    81.1 kg  ?Height:      ? ? ?Intake/Output Summary (Last 24 hours) at 03/10/2022 0740 ?Last data filed at 03/09/2022 1230 ?Gross per 24 hour  ?Intake 480 ml  ?Output --  ?Net 480 ml  ? ?Last 3 Weights 03/10/2022 03/09/2022 03/07/2022  ?Weight (lbs) 178 lb 11.2 oz 177 lb 186 lb 4.6 oz  ?Weight (kg) 81.058 kg 80.287 kg 84.5 kg  ?   ? ?Telemetry  ?  ?Predominantly sinus rhythm with HR in the 70s, infrequent atrial pacing, occasional elevations in heart rate to the 130s - Personally Reviewed ? ?ECG  ?  ?No new tracings - Personally Reviewed ? ?Physical Exam  ? ?GEN: No acute distress. Was standing at the sink and walked to chair for the  interview  ?Neck: No JVD ?Cardiac: Tachycardic, no murmurs, rubs, or gallops. Radial pulses 2+ bilaterally  ?Respiratory: Clear to auscultation bilaterally. Decreased breath sounds at lung bases  ?GI: Soft, nontender, non-distended  ?MS: No edema; No deformity. ?Neuro:  Nonfocal  ?Psych: Normal affect  ? ?Labs  ?  ?High Sensitivity Troponin:   ?Recent Labs  ?Lab 03/07/22 ?1228 03/07/22 ?1455 03/07/22 ?2059 03/08/22 ?5053  ?TROPONINIHS 20* 633* 4,110* 2,636*  ?   ?Chemistry ?Recent Labs  ?Lab 03/07/22 ?1228 03/07/22 ?1450 03/08/22 ?9767 03/09/22 ?0710 03/10/22 ?0217  ?NA 137   < > 139 137 135  ?K 3.9   < > 3.7 3.7 3.8  ?CL 101   < > 104 99 103  ?CO2 27  --  26 27 22   ?GLUCOSE 116*   < > 112* 101* 104*  ?BUN 22   < > 23 27* 30*  ?CREATININE 2.54*   < > 2.70* 2.82* 2.91*  ?CALCIUM 9.8  --  9.7 9.4 8.9  ?MG 2.1  --   --   --   --   ?PROT 7.1  --   --   --   --   ?  ALBUMIN 3.9  --   --   --   --   ?AST 23  --   --   --   --   ?ALT 19  --   --   --   --   ?ALKPHOS 137*  --   --   --   --   ?BILITOT 0.5  --   --   --   --   ?GFRNONAA 25*  --  24* 22* 22*  ?ANIONGAP 9  --  9 11 10   ? < > = values in this interval not displayed.  ?  ?Lipids  ?Recent Labs  ?Lab 03/09/22 ?0710  ?CHOL 144  ?TRIG 157*  ?HDL 30*  ?[place] 83  ?CHOLHDL 4.8  ?  ?Hematology ?Recent Labs  ?Lab 03/08/22 ?8466 03/09/22 ?0710 03/10/22 ?0217  ?WBC 9.0 7.0 6.7  ?RBC 3.58* 3.45* 3.13*  ?HGB 11.2* 10.8* 9.6*  ?HCT 33.8* 32.9* 30.1*  ?MCV 94.4 95.4 96.2  ?MCH 31.3 31.3 30.7  ?MCHC 33.1 32.8 31.9  ?RDW 15.2 15.4 15.3  ?PLT 252 246 219  ? ?Thyroid  ?Recent Labs  ?Lab 03/07/22 ?1454  ?TSH 3.023  ?  ?BNP ?Recent Labs  ?Lab 03/07/22 ?1228  ?BNP 380.9*  ?  ?DDimer No results for input(s): DDIMER in the last 168 hours.  ? ?Radiology  ?  ?ECHOCARDIOGRAM COMPLETE ? ?Result Date: 03/08/2022 ?   ECHOCARDIOGRAM REPORT   Patient Name:   Joe Wiggins Date of Exam: 03/08/2022 Medical Rec #:  [id number]  Height:       69.0 in Accession #:    [id number] Weight:       186.3 lb Date  of Birth:  Nov 21, 1946  BSA:          2.005 m? Patient Age:    76 years   BP:           156/70 mmHg Patient Gender: M          HR:           69 bpm. Exam Location:  Inpatient Procedure: 2D Echo, Cardiac Doppler and Color Doppler Indications:    MI  History:        Patient has prior history of Echocardiogram examinations. CHF,                 CAD, COPD, Arrythmias:Atrial Fibrillation; Risk Factors:Sleep                 Apnea.  Sonographer:    Jyl Heinz Referring Phys: [place]  1. Left ventricular ejection fraction, by estimation, is 40 to 45%. The left ventricle has mildly decreased function. The left ventricle demonstrates regional wall motion abnormalities (see scoring diagram/findings for description). Left ventricular diastolic parameters are consistent with Grade I diastolic dysfunction (impaired relaxation).  2. Right ventricular systolic function is normal. The right ventricular size is normal. There is normal pulmonary artery systolic pressure.  3. Left atrial size was mildly dilated.  4. Right atrial size was mildly dilated.  5. The mitral valve is normal in structure. Trivial mitral valve regurgitation.  6. Tricuspid valve regurgitation is mild to moderate.  7. The aortic valve is tricuspid. Aortic valve regurgitation is not visualized.  8. The inferior vena cava is normal in size with greater than 50% respiratory variability, suggesting right atrial pressure of 3 mmHg. Comparison(s): No significant change from prior study. Prior images reviewed side by side. FINDINGS  Left Ventricle: Left ventricular ejection  fraction, by estimation, is 40 to 45%. The left ventricle has mildly decreased function. The left ventricle demonstrates regional wall motion abnormalities. The left ventricular internal cavity size was normal in size. There is no left ventricular hypertrophy. Abnormal (paradoxical) septal motion, consistent with RV pacemaker. Left ventricular diastolic parameters are  consistent with Grade I diastolic dysfunction (impaired relaxation). Normal left ventricular filling pressure.  LV Wall Scoring: The antero-lateral wall and posterior wall are hypokinetic. Right Ventricle: The right ventricular size is normal. No increase in right ventricular wall thickness. Right ventricular systolic function is normal. There is normal pulmonary artery systolic pressure. The tricuspid regurgitant velocity is 2.48 m/s, and  with an assumed right atrial pressure of 3 mmHg, the estimated right ventricular systolic pressure is 85.2 mmHg. Left Atrium: Left atrial size was mildly dilated. Right Atrium: Right atrial size was mildly dilated. Pericardium: There is no evidence of pericardial effusion. Mitral Valve: The mitral valve is normal in structure. Trivial mitral valve regurgitation. Tricuspid Valve: The tricuspid valve is normal in structure. Tricuspid valve regurgitation is mild to moderate. Aortic Valve: The aortic valve is tricuspid. Aortic valve regurgitation is not visualized. Aortic valve peak gradient measures 4.9 mmHg. Pulmonic Valve: The pulmonic valve was normal in structure. Pulmonic valve regurgitation is not visualized. Aorta: The aortic root and ascending aorta are structurally normal, with no evidence of dilitation. Venous: The inferior vena cava is normal in size with greater than 50% respiratory variability, suggesting right atrial pressure of 3 mmHg. IAS/Shunts: No atrial level shunt detected by color flow Doppler. Additional Comments: A device lead is visualized in the right ventricle.  LEFT VENTRICLE PLAX 2D LVIDd:         5.30 cm      Diastology LVIDs:         4.40 cm      LV e' medial:    4.68 cm/s LV PW:         1.10 cm      LV E/e' medial:  12.5 LV IVS:        1.00 cm      LV e' lateral:   9.14 cm/s LVOT diam:     2.00 cm      LV E/e' lateral: 6.4 LV SV:         55 LV SV Index:   27 LVOT Area:     3.14 cm?  LV Volumes (MOD) LV vol d, MOD A2C: 147.0 ml LV vol d, MOD A4C: 134.0  ml LV vol s, MOD A2C: 85.0 ml LV vol s, MOD A4C: 75.8 ml LV SV MOD A2C:     62.0 ml LV SV MOD A4C:     134.0 ml LV SV MOD BP:      62.4 ml RIGHT VENTRICLE             IVC RV Basal diam:  3.90 cm     IVC diam:

## 2022-03-10 NOTE — Progress Notes (Signed)
Mobility Specialist Progress Note: ? ? 03/10/22 1600  ?Mobility  ?Activity Ambulated with assistance in hallway  ?Level of Assistance Modified independent, requires aide device or extra time  ?Assistive Device None  ?Distance Ambulated (ft) 200 ft  ?Activity Response Tolerated fair  ?$Mobility charge 1 Mobility  ? ? ?During Mobility: SpO2 91% RA. ? ?Pt with increased SOB upon exertion. Required x1 standing rest break d/t fatigue, SpO2 >90% throughout session on RA. Pt back in chair with all needs met.  ? ? ?Nelta Numbers ?Acute Rehab ?Phone: [phone] ?Office Phone: [phone] ? ?

## 2022-03-10 NOTE — Progress Notes (Addendum)
Pt BP 176/92. Administered PRN PO 25mg  hydralazine  ? ?F/u BP 147/78 at 6:33 PM ? ?Raelyn Number, RN ? ?

## 2022-03-10 NOTE — Progress Notes (Signed)
Heart Failure Nurse Navigator Progress Note ? ?PCP: Loura Pardon, MD ?PCP-Cardiologist: Dr. Juleen China ?Admission Diagnosis: Orthopnea and lower extremity edema, hypoxia ?Admitted from: Home ? ?Presentation:   ?Joe Wiggins presented on 03/07/22 with Chest pain,SOB while walking in a store with his wife. Felt numbness in his arm. Wife drove him to the ER. Hx CAD,CHF, CKD, OSA, TIA, CVA, Anemia, COPD, CABG 2014,  ? ?ECHO/ LVEF: 40-45% ? ?Clinical Course: ? ?Past Medical History:  ?Diagnosis Date  ? Abrasion of forearm, right 07/27/2019  ? Acute respiratory failure ([place])   ? AF (paroxysmal atrial fibrillation) ([place])   ? AICD (automatic cardioverter/defibrillator) present   ? CAD (coronary artery disease)   ? Cardiomyopathy, ischemic   ? Carotid artery occlusion   ? Chronic kidney disease   ? COPD (chronic obstructive pulmonary disease) ([place])   ? Cough 11/13/2019  ? Dementia without behavioral disturbance ([place])   ? Dizziness 01/17/2016  ? Facial rash 08/15/2018  ? GERD (gastroesophageal reflux disease)   ? Hyperlipidemia   ? Hypertension   ? Pre-syncope 01/16/2018  ? Respiratory distress 12/27/2020  ? Sleep apnea   ? Stroke [facility])   ? TIA (transient ischemic attack)   ?  ? ?Social History  ? ?Socioeconomic History  ? Marital status: Married  ?  Spouse name: Beth Spackman  ? Number of children: 2  ? Years of education: Not on file  ? Highest education level: 11th grade  ?Occupational History  ? 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 Smoking status: Former  ?  Types: Cigarettes  ?  Quit date: 11/30/2012  ?  Years since quitting: 9.2  ? Smokeless tobacco: Never  ?Vaping Use  ? Vaping Use: Never used  ?Substance and Sexual Activity  ? Alcohol use: No  ?  Comment: last drink 10 + years  ? Drug use: No  ? Sexual activity: Not on file  ?Other Topics Concern  ? Not on file  ?Social History Narrative  ? Not on file  ? ?Social Determinants of Health  ? ?Financial Resource Strain: Low Risk   ? Difficulty of  Paying Living Expenses: Not hard at all  ?Food Insecurity: No Food Insecurity  ? Worried About Charity fundraiser in the Last Year: Never true  ? Ran Out of Food in the Last Year: Never true  ?Transportation Needs: No Transportation Needs  ? Lack of Transportation (Medical): No  ? Lack of Transportation (Non-Medical): No  ?Physical Activity: Not on file  ?Stress: Not on file  ?Social Connections: Not on file  ? ? ?High Risk Criteria for Readmission and/or Poor Patient Outcomes: ?Heart failure hospital admissions (last 6 months): 1  ?No Show rate: 3% ?Difficult social situation: No ?Demonstrates medication adherence: yes, takes as prescribed ?Primary Language: English ?Literacy level: No issues with reading/writing.  ? ?Barriers of Care:  decreased energy ? ? ?Considerations/Referrals:  ? ?Referral made to Heart Failure Pharmacist Stewardship: As needed ?Referral made to Heart Failure CSW/NCM TOC: No needs ?Referral made to Heart & Vascular TOC clinic: Appt 03/22/22 ? ?Items for Follow-up on DC/TOC: ?Optimize ?Medication compliance. ? ? ?Earnestine Leys, BSN, RN ?Heart Failure Nurse Navigator ?[phone]   ?

## 2022-03-10 NOTE — Progress Notes (Signed)
?PROGRESS NOTE ? ? ? ?Francisco Ostrovsky  LFY:[id number] DOB: [birth year] DOA: 03/07/2022 ?PCP: Loura Pardon, MD  ?Hendrik Donath is a 76 y.o. 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 ? ?Subjective: ?-Feels okay, denies any complaints, breathing improving ? ?Assessment & Plan: ? ?Acute on chronic combined systolic and diastolic CHF decompensation ?-Presented with orthopnea and lower extremity edema and hypoxia ?-ECHo w/ EF 40-45% ?-Diuresed with IV Lasix ?-Clinically appears close to euvolemic, cardiology following, switch to oral torsemide yesterday, mild uptrend in creatinine noted, torsemide dose decreased ?-BMP in a.m. ?-Discharge planning ?  ?NSTEMI ?-History of multivessel CAD, most recent cath in 2019 showed multivessel CAD 75% - 100% stenosis including mid LM, OST LAD-proximal LAD, OST first marginal, proximal Cx to mid Cx and proximal RCA to mid RCA. ?-Cardiology following, continue aspirin, heparin drip-till tonight, continue beta-blocker and statin.   ?-Plan for medical management only given advanced CKD 4 ?-Increase activity, PT eval ?  ?HTN, uncontrolled ?- Continue labetalol, PRN hydralazine. ?  ?CKD stage IV ?-Baseline creatinine around 2.1-2.3 ?-Now 2.5-2.7 range, likely cardiorenal ?-Followed by nephrology Dr. Joelyn Oms, will need close follow-up, diuretics as above ?  ?PAF ?-Currently in sinus rhythm ?-Per cardiology continue heparin GTT till tonight ?  ?COPD ?-Stable, no symptoms or signs of exacerbation, no indication for steroids ?-Continue nebs, inhalers ?  ?HLD -continue statin, high intensity ? ?BPH -Continue Flomax ? ?Left lower lobe lung nodule-Chronic,  possibly scarring, would need further work-up ?  ?DVT prophylaxis: Heparin drip ?Code Status: Full code ?Family Communication: Wife at bedside ?Disposition Plan: Home tomorrow ? ?Consultants:  ?Cards ? ?Procedures:  ? ?Antimicrobials:  ? ? ?Objective: ?Vitals:  ? 03/10/22 0318 03/10/22 0432 03/10/22 0857 03/10/22 1135  ?BP: (!) 142/61  135/76 136/65  ?Pulse: 69  79 71  ?Resp: 19  20 18   ?Temp: 97.8 ?F (36.6 ?C)  98.4 ?F (36.9 ?C) 98 ?F (36.7 ?C)  ?TempSrc: Oral  Oral Oral  ?SpO2: 96%  98% 96%  ?Weight:  81.1 kg    ?Height:      ? ? ?Intake/Output Summary (Last 24 hours) at 03/10/2022 1216 ?Last data filed at 03/10/2022 0900 ?Gross per 24 hour  ?Intake 480 ml  ?Output --  ?Net 480 ml  ? ?Filed Weights  ? 03/07/22 2000 03/09/22 0400 03/10/22 0432  ?Weight: 84.5 kg 80.3 kg 81.1 kg  ? ? ?Examination: ? ?General exam: Pleasant male sitting up in bed, AAOx3, no distress ?HEENT: No JVD ?CVS: S1-S2, regular rate rhythm ?Abdomen: Soft, nontender, bowel sounds present ?Extremities: Trace edema ?Skin: No rashes ?Psychiatry: Judgement and insight appear normal. Mood & affect appropriate.  ? ? ? ?Data Reviewed:  ? ?CBC: ?Recent Labs  ?Lab 03/07/22 ?1228 03/07/22 ?1450 03/08/22 ?8527 03/09/22 ?0710 03/10/22 ?0217  ?WBC 7.7  --  9.0 7.0 6.7  ?NEUTROABS 5.4  --   --   --   --   ?HGB 12.0* 10.5* 11.2* 10.8* 9.6*  ?HCT 36.3* 31.0* 33.8* 32.9* 30.1*  ?MCV 95.3  --  94.4 95.4 96.2  ?PLT 253  --  252 246 219  ? ?Basic Metabolic Panel: ?Recent Labs  ?Lab 03/07/22 ?1228 03/07/22 ?1450 03/08/22 ?7824 03/09/22 ?0710  03/10/22 ?0217  ?NA 137 137 139 137 135  ?K 3.9 4.2 3.7 3.7 3.8  ?CL 101 101 104 99 103  ?CO2 27  --  26 27 22   ?GLUCOSE 116* 107* 112* 101* 104*  ?BUN 22 28* 23 27* 30*  ?CREATININE 2.54* 2.70* 2.70* 2.82* 2.91*  ?CALCIUM 9.8  --  9.7 9.4 8.9  ?MG 2.1  --   --   --   --   ? ?GFR: ?Estimated Creatinine Clearance: 21.6 mL/min (A) (by C-G formula based on SCr of 2.91 mg/dL (H)). ?Liver Function Tests: ?Recent Labs  ?Lab  03/07/22 ?1228  ?AST 23  ?ALT 19  ?ALKPHOS 137*  ?BILITOT 0.5  ?PROT 7.1  ?ALBUMIN 3.9  ? ?Recent Labs  ?Lab 03/07/22 ?1228 03/08/22 ?0117  ?LIPASE 74* 71*  ? ?No results for input(s): AMMONIA in the last 168 hours. ?Coagulation Profile: ?No results for input(s): INR, PROTIME in the last 168 hours. ?Cardiac Enzymes: ?No results for input(s): CKTOTAL, CKMB, CKMBINDEX, TROPONINI in the last 168 hours. ?BNP (last 3 results) ?No results for input(s): PROBNP in the last 8760 hours. ?HbA1C: ?No results for input(s): HGBA1C in the last 72 hours. ?CBG: ?Recent Labs  ?Lab 03/07/22 ?2104  ?GLUCAP 108*  ? ?Lipid Profile: ?Recent Labs  ?  03/09/22 ?0710  ?CHOL 144  ?HDL 30*  ?[place] 83  ?TRIG 157*  ?CHOLHDL 4.8  ? ?Thyroid Function Tests: ?Recent Labs  ?  03/07/22 ?1454  ?TSH 3.023  ? ?Anemia Panel: ?No results for input(s): VITAMINB12, FOLATE, FERRITIN, TIBC, IRON, RETICCTPCT in the last 72 hours. ?Urine analysis: ?   ?Component Value Date/Time  ? [place] YELLOW 01/17/2016 1519  ? APPEARANCEUR CLEAR 01/17/2016 1519  ? LABSPEC 1.015 01/17/2016 1519  ? PHURINE 7.0 01/17/2016 1519  ? GLUCOSEU NEGATIVE 01/17/2016 1519  ? [place] NEGATIVE 01/17/2016 1519  ? [place] NEGATIVE 01/17/2016 1519  ? [place] NEGATIVE 01/17/2016 1519  ? [place] NEGATIVE 01/17/2016 1519  ? NITRITE NEGATIVE 01/17/2016 1519  ? LEUKOCYTESUR NEGATIVE 01/17/2016 1519  ? ?Sepsis Labs: ?@[email or web address] (procalcitonin:4,lacticidven:4) ? ?) ?Recent Results (from the past 240 hour(s))  ?Resp Panel by RT-PCR (Flu A&B, Covid) Nasopharyngeal Swab     Status: None  ? Collection Time: 03/07/22 12:35 PM  ? Specimen: Nasopharyngeal Swab; Nasopharyngeal(NP) swabs in vial transport medium  ?Result Value Ref Range Status  ? SARS Coronavirus 2 by RT PCR NEGATIVE NEGATIVE Final  ?  Comment: (NOTE) ?SARS-CoV-2 target nucleic acids are NOT DETECTED. ? ?The SARS-CoV-2 RNA is generally detectable in upper respiratory ?specimens during the acute phase of infection. The  lowest ?concentration of SARS-CoV-2 viral copies this assay can detect is ?138 copies/mL. A negative result does not preclude SARS-Cov-2 ?infection and should not be used as the sole basis for treatment or ?other patient management decisions. A negative result may occur with  ?improper specimen collection/handling, submission of specimen other ?than nasopharyngeal swab, presence of viral mutation(s) within the ?areas targeted by this assay, and inadequate number of viral ?copies(<138 copies/mL). A negative result must be combined with ?clinical observations, patient history, and epidemiological ?information. The expected result is Negative. ? ?Fact Sheet for Patients:  ?EntrepreneurPulse.com.au ? ?Fact Sheet for Healthcare Providers:  ?IncredibleEmployment.be ? ?This test is no t yet approved or cleared by the [place] FDA and  ?has been authorized for detection and/or diagnosis of SARS-CoV-2 by ?FDA under an Emergency Use Authorization (EUA). This EUA will remain  ?in effect (meaning this test can be used) for the duration of  the ?COVID-19 declaration under Section 564(b)(1) of the Act, 21 ?U.S.C.section 360bbb-3(b)(1), unless the authorization is terminated  ?or revoked sooner.  ? ? ?  ? Influenza A by PCR NEGATIVE NEGATIVE Final  ? Influenza B by PCR NEGATIVE NEGATIVE Final  ?  Comment: (NOTE) ?The Xpert Xpress SARS-CoV-2/FLU/RSV plus assay is intended as an aid ?in the diagnosis of influenza from Nasopharyngeal swab specimens and ?should not be used as a sole basis for treatment. Nasal washings and ?aspirates are unacceptable for Xpert Xpress SARS-CoV-2/FLU/RSV ?testing. ? ?Fact Sheet for Patients: ?EntrepreneurPulse.com.au ? ?Fact Sheet for Healthcare Providers: ?IncredibleEmployment.be ? ?This test is not yet approved or cleared by the [place] FDA and ?has been authorized for detection and/or diagnosis of SARS-CoV-2 by ?FDA under  an Emergency Use Authorization (EUA). This EUA will remain ?in effect (meaning this test can be used) for the duration of the ?COVID-19 declaration under Section 564(b)(1) of the Act, 21 U.S.C. ?section 360bbb-3(b)(1), unless t

## 2022-03-11 DIAGNOSIS — I5043 Acute on chronic combined systolic (congestive) and diastolic (congestive) heart failure: Secondary | ICD-10-CM | POA: Diagnosis not present

## 2022-03-11 LAB — BASIC METABOLIC PANEL
Anion gap: 9 (ref 5–15)
BUN: 28 mg/dL — ABNORMAL HIGH (ref 8–23)
CO2: 24 mmol/L (ref 22–32)
Calcium: 9 mg/dL (ref 8.9–10.3)
Chloride: 105 mmol/L (ref 98–111)
Creatinine, Ser: 2.76 mg/dL — ABNORMAL HIGH (ref 0.61–1.24)
GFR, Estimated: 23 mL/min — ABNORMAL LOW (ref >60)
Glucose, Bld: 105 mg/dL — ABNORMAL HIGH (ref 70–99)
Potassium: 3.8 mmol/L (ref 3.5–5.1)
Sodium: 138 mmol/L (ref 135–145)

## 2022-03-11 MED ORDER — COLCHICINE 0.6 MG PO TABS: 0.6000 mg | ORAL_TABLET | Freq: Every day | ORAL | Status: DC

## 2022-03-11 MED ORDER — TORSEMIDE 20 MG PO TABS
40.0000 mg | ORAL_TABLET | Freq: Two times a day (BID) | ORAL | Status: DC
Start: 2022-03-11 — End: 2022-04-19

## 2022-03-11 NOTE — Progress Notes (Signed)
Mobility Specialist Progress Note ? ? 03/11/22 1055  ?Mobility  ?Activity Ambulated independently in hallway  ?Level of Assistance Independent  ?Assistive Device None  ?Distance Ambulated (ft) 300 ft  ?Activity Response Tolerated well  ?$Mobility charge 1 Mobility  ? ?Received pt in chair having no complaints and agreeable to mobility. X1 standing rest break d/t SOB but SpO2 remaining  >94%, practiced pursed lip breathing and pt felt better to continue. Returned back to chair w/ call bell in reach, wife present all needs met. ? ?Holland Falling ?Mobility Specialist ?Phone Number [phone] ? ?

## 2022-03-11 NOTE — Plan of Care (Signed)
  Problem: Education: Goal: Knowledge of General Education information will improve Description: Including pain rating scale, medication(s)/side effects and non-pharmacologic comfort measures Outcome: Adequate for Discharge   Problem: Health Behavior/Discharge Planning: Goal: Ability to manage health-related needs will improve Outcome: Adequate for Discharge   Problem: Clinical Measurements: Goal: Ability to maintain clinical measurements within normal limits will improve Outcome: Adequate for Discharge Goal: Will remain free from infection Outcome: Adequate for Discharge Goal: Diagnostic test results will improve Outcome: Adequate for Discharge Goal: Respiratory complications will improve Outcome: Adequate for Discharge Goal: Cardiovascular complication will be avoided Outcome: Adequate for Discharge   Problem: Pain Managment: Goal: General experience of comfort will improve Outcome: Adequate for Discharge   Problem: Skin Integrity: Goal: Risk for impaired skin integrity will decrease Outcome: Adequate for Discharge   

## 2022-03-11 NOTE — Plan of Care (Signed)
  Problem: Health Behavior/Discharge Planning: Goal: Ability to manage health-related needs will improve Outcome: Progressing   Problem: Clinical Measurements: Goal: Ability to maintain clinical measurements within normal limits will improve Outcome: Progressing Goal: Diagnostic test results will improve Outcome: Progressing Goal: Respiratory complications will improve Outcome: Progressing   

## 2022-03-11 NOTE — Progress Notes (Signed)
Explained discharge summary to patient and family member. Reviewed follow up appointments, next medication administration times, and CHF education. Both expressed having understanding. Removed IV. Telemetry was removed by floor staff. All belongings are in the patient's possess. Transported down for discharge.  ?

## 2022-03-11 NOTE — Progress Notes (Signed)
CARDIAC REHAB PHASE I  ? ?Pt and spouse deny further questions or concerns regarding yesterdays education. States fatigue, but denies CP or SOB. Encouraged daily weights, monitoring salt intake, and ambulation as able with emphasis on safety. Referred to [facility] [place]. ? ?[phone] ?Rufina Falco, RN BSN ?03/11/2022 ?9:49 AM ? ?

## 2022-03-11 NOTE — Plan of Care (Signed)

## 2022-03-11 NOTE — Care Management Important Message (Signed)
Important Message ? ?Patient Details  ?Name: Joe Wiggins ?MRN: [id number] ?Date of Birth: Apr 29, 1946 ? ? ?Medicare Important Message Given:  Yes ? ? ? ? ?Shelda Altes ?03/11/2022, 8:42 AM ?

## 2022-03-11 NOTE — Discharge Summary (Signed)
Physician Discharge Summary  ?Om Lizotte OJJ:[id number] DOB: [birth year] DOA: 03/07/2022 ? ?PCP: Loura Pardon, MD ? ?Admit date: 03/07/2022 ?Discharge date: 03/11/2022 ? ?Time spent: 58minutes ? ?Recommendations for Outpatient Follow-up:  ?Nephrology Dr. Joelyn Oms in 1 to 2 weeks with labs, titrate diuretics as needed ?Cardiology clinic follow-up arranged ?Repeat CT chest in 2 months ? ?Discharge Diagnoses:  ?Principal Problem: ?  Acute on chronic combined systolic and diastolic CHF (congestive heart failure) ([place]) ?CKD 4 ?  Paroxysmal atrial fibrillation (HCC) ?  CKD (chronic kidney disease) stage 4, GFR 15-29 ml/min (HCC) ?  COPD (chronic obstructive pulmonary disease) ([place]) ?  Cardiac pacemaker in situ ?  NSTEMI (non-ST elevated myocardial infarction) ([place]) ?  Lung nodule ? ? ?Discharge Condition: Stable ? ?Diet recommendation: Low-sodium, heart healthy ? ?Filed Weights  ? 03/09/22 0400 03/10/22 0432 03/11/22 0500  ?Weight: 80.3 kg 81.1 kg 80.4 kg  ? ? ?History of present illness:  ?Joe Wiggins is a 76 y.o. male with medical history significant of multivessel CAD status post CABG x4, PAF on Eliquis, chronic combined HFrEF and HFpEF secondary to ischemic cardiomyopathy status post AICD, CKD stage IV, COPD, HTN, HLD, BPH, gout, presented with new onset of chest pain, troponin peaked at 4110, also noted to be fluid overloaded, treated with IV heparin, diuretics ? ?Hospital Course:  ? ?Acute on chronic combined systolic and diastolic CHF decompensation ?-Presented with orthopnea and lower extremity edema and hypoxia ?-ECHo w/ EF 40-45% ?-Diuresed with IV Lasix initially ?-Clinically appears close to euvolemic, cardiology following, switch to oral torsemide yesterday, mild uptrend in creatinine noted, torsemide dose decreased to 40 Mg twice daily ?-Creatinine 2.7 at discharge which is close to his baseline, patient is a follow-up next week with Dr. Joelyn Oms his nephrologist ?  ?NSTEMI ?-History of multivessel CAD,  most recent cath in 2019 showed multivessel CAD 75% - 100% stenosis including mid LM, OST LAD-proximal LAD, OST first marginal, proximal Cx to mid Cx and proximal RCA to mid RCA. ?-Followed by cardiology this admission treated with aspirin heparin beta-blocker and statin,  ?-Plan for medical management only given advanced CKD 4 ?-Stable now, diuretics as above, follow-up with cardiology ?  ?HTN, uncontrolled ?- Continue labetalol, PRN hydralazine. ?  ?CKD stage IV ?-Baseline creatinine around 2.1-2.3 ?-Now 2.5-2.7 range, likely cardiorenal ?-Followed by nephrology Dr. Joelyn Oms, will need close follow-up, diuretics as above ?  ?PAF ?-Currently in sinus rhythm ?-Eliquis resumed ?  ?COPD ?-Stable, no symptoms or signs of exacerbation ?-Continue nebs, inhalers ?  ?HLD -continue statin, high intensity ?  ?BPH -Continue Flomax ?  ?Left lower lobe lung nodule-Chronic, possibly scarring, would need further work-up ? ?Discharge Exam: ?Vitals:  ? 03/11/22 0844 03/11/22 0853  ?BP: (!) 147/81   ?Pulse: (!) 114 67  ?Resp: 20   ?Temp: 97.6 ?F (36.4 ?C)   ?SpO2: 99%   ? ? ?General exam: Pleasant male sitting up in bed, AAOx3, no distress ?HEENT: No JVD ?CVS: S1-S2, regular rate rhythm ?Abdomen: Soft, nontender, bowel sounds present ?Extremities: Trace edema ?Skin: No rashes ?Psychiatry: Judgement and insight appear normal. Mood & affect appropriate.  ? ?Discharge Instructions ? ? ?Discharge Instructions   ? ? Amb Referral to Cardiac Rehabilitation   Complete by: As directed ?  ? Diagnosis: NSTEMI  ? After initial evaluation and assessments completed: Virtual Based Care may be provided alone or in conjunction with Phase 2 Cardiac Rehab based on patient barriers.: Yes  ? Diet - low sodium heart healthy   Complete  by: As directed ?  ? Increase activity slowly   Complete by: As directed ?  ? ?  ? ?Allergies as of 03/11/2022   ? ?   Reactions  ? Ipratropium Other (See Comments)  ? Metoprolol   ? Was bringing blood pressure too low and he  would pass out.  ? Morphine And Related Nausea Only  ? Vicodin [hydrocodone-acetaminophen] Other (See Comments)  ? Hallucinations  ? Penicillins Rash  ? Did it involve swelling of the face/tongue/throat, SOB, or low BP? No ?Did it involve sudden or severe rash/hives, skin peeling, or any reaction on the inside of your mouth or nose? Yes ?Did you need to seek medical attention at a hospital or doctor's office? No ?When did it last happen? Over 7 Years Ago      ?If all above answers are "NO", may proceed with cephalosporin use.  ? ?  ? ?  ?Medication List  ?  ? ?TAKE these medications   ? ?albuterol 0.63 MG/3ML nebulizer solution ?Commonly known as: ACCUNEB ?Take 3 mLs by nebulization 4 (four) times daily as needed for shortness of breath or wheezing. 3 ml inhale orally via nebulizer four times a day ?  ?albuterol 108 (90 Base) MCG/ACT inhaler ?Commonly known as: VENTOLIN HFA ?Inhale 2 puffs into the lungs every 6 (six) hours as needed for wheezing or shortness of breath. ?  ?allopurinol 100 MG tablet ?Commonly known as: ZYLOPRIM ?Take 100 mg by mouth daily. ?  ?atorvastatin 40 MG tablet ?Commonly known as: LIPITOR ?TAKE 1 TABLET EVERY DAY ?  ?cetirizine 10 MG tablet ?Commonly known as: ZYRTEC ?Take 10 mg by mouth daily. ?  ?cholecalciferol 25 MCG (1000 UNIT) tablet ?Commonly known as: VITAMIN D ?Take 1,000 Units by mouth daily. ?  ?colchicine 0.6 MG tablet ?Take 1 tablet (0.6 mg total) by mouth daily. ?What changed:  ?when to take this ?reasons to take this ?  ?Eliquis 5 MG Tabs tablet ?Generic drug: apixaban ?TAKE 1 TABLET TWICE DAILY ?  ?ferrous sulfate 325 (65 FE) MG tablet ?Take 325 mg by mouth daily with breakfast. ?  ?folic acid 354 MCG tablet ?Commonly known as: FOLVITE ?Take 400 mcg by mouth daily. ?  ?labetalol 300 MG tablet ?Commonly known as: NORMODYNE ?Take 1 tablet (300 mg total) by mouth 2 (two) times daily. ?  ?multivitamin tablet ?Take 1 tablet by mouth daily. ?  ?nitroGLYCERIN 0.4 MG SL  tablet ?Commonly known as: NITROSTAT ?PLACE 1 TABLET (0.4 MG TOTAL) UNDER THE TONGUE EVERY 5 (FIVE) MINUTES AS NEEDED FOR CHEST PAIN. ?  ?tamsulosin 0.4 MG Caps capsule ?Commonly known as: FLOMAX ?Take 0.4 mg by mouth every evening. ?  ?torsemide 20 MG tablet ?Commonly known as: DEMADEX ?Take 2 tablets (40 mg total) by mouth 2 (two) times daily. ?What changed: how much to take ?  ?Trelegy Ellipta 100-62.5-25 MCG/ACT Aepb ?Generic drug: Fluticasone-Umeclidin-Vilant ?Inhale 1 puff into the lungs daily. ?  ?VITAMIN B 12 PO ?Take 1 tablet by mouth daily. ?  ? ?  ? ?Allergies  ?Allergen Reactions  ? Ipratropium Other (See Comments)  ? Metoprolol   ?  Was bringing blood pressure too low and he would pass out.  ? Morphine And Related Nausea Only  ? Vicodin [Hydrocodone-Acetaminophen] Other (See Comments)  ?  Hallucinations ?  ? Penicillins Rash  ?  Did it involve swelling of the face/tongue/throat, SOB, or low BP? No ?Did it involve sudden or severe rash/hives, skin peeling, or any reaction on  the inside of your mouth or nose? Yes ?Did you need to seek medical attention at a hospital or doctor's office? No ?When did it last happen? Over 7 Years Ago      ?If all above answers are "NO", may proceed with cephalosporin use. ? ?  ? ? Follow-up Information   ? ? Sande Rives E, PA-C Follow up on 03/30/2022.   ?Specialties: Physician Assistant, Cardiology ?Why: Appointment at 2:20 ?Contact information: ?[address] ?[address] ?[place] [address] ?[phone] ? ? ?  ?  ? ?  ?  ? ?  ? ? ? ?The results of significant diagnostics from this hospitalization (including imaging, microbiology, ancillary and laboratory) are listed below for reference.   ? ?Significant Diagnostic Studies: ?DG Chest 1 View ? ?Result Date: 03/07/2022 ?CLINICAL DATA:  LEFT-sided chest pain in a 76 year old male. EXAM: CHEST  1 VIEW COMPARISON:  January 06, 2021 and May 12, 2021. FINDINGS: Signs of LEFT carotid stenting as before. Power pack over the  LEFT chest for 3 lead pacer defibrillator device. Post median sternotomy. Cardiomediastinal contours and hilar structures are stable. Increased interstitial markings are noted at the lung bases more pronounced tha

## 2022-03-18 ENCOUNTER — Telehealth (HOSPITAL_COMMUNITY): Payer: Self-pay

## 2022-03-18 NOTE — Telephone Encounter (Signed)
Pt insurance is active and benefits verified through [employer] $10, DED 0/0 met, out of pocket $3,400/$52.41 met, co-insurance 0%. no pre-authorization required. Passport, [place]/[employer] 03/17/2022_0 :00pm, REF# [id number] ?

## 2022-03-18 NOTE — Telephone Encounter (Signed)
**  CORRECTION APPOINTMENT**    ?Called patient to see if he was interested in participating in the Pulmonary Rehab Program. Patient stated yes. Patient will come in for orientation on 04/13/2022@9 :00am and will attend the 1:15pm exercise class. ?  ?Mailed package ? ? ? ?

## 2022-03-18 NOTE — Telephone Encounter (Deleted)
Patients wife called and stated that pt was interested in participating in the Pulmonary Rehab Program. Patient will come in for orientation on 04/04/2022@9 :00am and will attend the 1:15pm exercise class. ?  ?Tourist information centre manager. ?

## 2022-03-19 NOTE — Progress Notes (Deleted)
?Cardiology Office Note:   ? ?Date:  03/19/2022  ? ?ID:  Joe Wiggins, DOB 1946/09/16, MRN [id number] ? ?PCP:  Loura Pardon, MD  ?Cardiologist:  Donato Heinz, MD  ?Electrophysiologist:  Cristopher Peru, MD  ? ?Referring MD: Loura Pardon, MD  ? ?Chief Complaint: hospital follow-up of NSTEMI and CHF ? ?History of Present Illness:   ? ?Joe Wiggins is a 76 y.o. male with a history of CAD s/p CABG  ?x4 in 2014 at [facility] with patent grafts on last cath in 12/2017, ischemic cardiomyopathy/chronic combined CHF with EF of 40-45% on Echo in 02/2022, VF arrest during hospitalization in 12/2017 for pneumonia s/p PPM upgrade to ICD, paroxysmal atrial fibrillation on Eliquis, sinus node dysfunction s/p PPM and then update to ICD in 12/2017, CVA, carotid artery disease with known CTO of right ICA and s/p stenting to left ICA, COPD, hypertension, hyperlipidemia, obstructive sleep apnea, GERD, CKD stage IV followed by Nephrology, and dementia who is followed by Dr. Nechama Guard and Dr. Lovena Le and presents today for hospital follow-up of NSTEMI and CHF. ? ?Patient was last seen by Dr. Gardiner Rhyme in 10/2021 at which time he was doing okay from a cardiac standpoint.  He reported occasional dyspnea and occasional lightheadedness but no chest pain, palpitations, or edema and his weight were stable on Torsemide. ? ?He was recently admitted from 03/07/2022 to 03/11/2022 for NSTEMI and acute on chronic combined CHF after presenting with chest pain and volume overload.  High-sensitivity troponin peaked at 4110.  BNP mildly elevated at 380.  Echo showed LVEF of 40-45% with abnormal septal motion consistent with RV pacemaker and grade 1 diastolic dysfunction, normal RV, mild biatrial enlargement, and mild to moderate TR.  Cardiology was consulte and given age and renal function, decision was made to treat medically.  He was treated with IV Heparin for 48 hours was diuresed with IV Lasix.  Discharge weight was 177 lbs.  ? ?He was seen in the  [facility] on 03/22/2022 at which time *** ? ?Patient presents today for follow-up. *** ? ?CAD s/p CABG ?S/p CABG [facility]4 in 2014 at [facility].  Last cath in 12/2017 showed patent grafts.  He was recently admitted with NSTEMI and CHF exacerbation but decision was made to treat medically given age and renal function. ?- No chest pain.  ?- No aspirin due to need for Eliquis. ?- Continue beta-blocker and high intensity statin. ? ?Ischemic Cardiomyopathy ?Chronic Combined CHF ?Recently admitted for NSTEMI and CHF exacerbation.  Echo during admission showed LVEF of 40-45% with abnormal septal motion consistent with RV pacemaker and grade 1 diastolic dysfunction, normal RV, mild biatrial enlargement, and mild to moderate TR. ?- Euvolemic on exam.  ?- Continue Torsemide 40mg  twice daily.  ?- Continue Labetalol 300mg  twice daily. *** Switch to Coreg *** ?- No ACEi/ARB/ARNI or MRA due to renal function. ?- Discussed importance of daily weights and sodium/fluid restrictions. ? ?Paroxysmal Atrial Fibrillation ?History of VF Arrest ?Sinus Node Dysfunction s/p Abbott ICD ?Patient has known paroxysmal atrial fibrillation and sinus node dysfunction and initially had a PPM. However, this was upgraded to ICD in 12/2017 after he had a VF arrest during a hospitalization for pneumonia.  ?- Maintaining sinus rhythm on exam.  ?- Continue Labetalol 300mg  twice daily. ?- Continue chronic anticoagulation with Eliquis 5mg  twice daily.  ?- Also followed by Dr. Lovena Le. Follow-up with EP as directed.  ? ?Carotid Artery Disease ?Last carotid ultrasound in 10/2021 showed known total occlusion of  RCA and 50-75% stenosis of left ICA. ?- No aspirin due to need for Eliquis.  ?- Continue statin.  ?- Followed by Vascular Surgery. ? ?Hypertension ?BP well controlled.  ?- Continue Labetalol 300mg  twice daily. ? ?Hyperlipidemia ?Lipid panel during recent admission: Total Cholesterol 144, Triglycerides 157, HDL 30, LDL 83. LDL goal <55.  ?- Lipitor  was increased to 80mg  daily during recent admission. ?- Will repeat lipid panel and LFTs in 2 months.  ? ?CKD Stage IV ?Baseline creatinine 2.0 to 2.3. Creatinine 2.76 on day of discharge. ?- Will repeat BMET today. *** ?- Followed by Nephrology. *** ? ? ?Past Medical History:  ?Diagnosis Date  ? Abrasion of forearm, right 07/27/2019  ? Acute respiratory failure ([place])   ? AF (paroxysmal atrial fibrillation) ([place])   ? AICD (automatic cardioverter/defibrillator) present   ? CAD (coronary artery disease)   ? Cardiomyopathy, ischemic   ? Carotid artery occlusion   ? Chronic kidney disease   ? COPD (chronic obstructive pulmonary disease) ([place])   ? Cough 11/13/2019  ? Dementia without behavioral disturbance ([place])   ? Dizziness 01/17/2016  ? Facial rash 08/15/2018  ? GERD (gastroesophageal reflux disease)   ? Hyperlipidemia   ? Hypertension   ? Pre-syncope 01/16/2018  ? Respiratory distress 12/27/2020  ? Sleep apnea   ? Stroke [facility])   ? TIA (transient ischemic attack)   ? ? ?Past Surgical History:  ?Procedure Laterality Date  ? CARDIAC PACEMAKER PLACEMENT    ? CATARACT EXTRACTION    ? CORONARY ARTERY BYPASS GRAFT    ? CORONARY STENT PLACEMENT    ? EYE SURGERY    ? ICD IMPLANT N/A 12/29/2017  ? Procedure: ICD IMPLANT;  Surgeon: Evans Lance, MD;  Location: [facility];  Service: Cardiovascular;  Laterality: N/A;  ? LEFT HEART CATH AND CORS/GRAFTS ANGIOGRAPHY N/A 12/29/2017  ? Procedure: LEFT HEART CATH AND CORS/GRAFTS ANGIOGRAPHY;  Surgeon: Martinique, Peter M, MD;  Location: [facility];  Service: Cardiovascular;  Laterality: N/A;  ? ? ?Current Medications: ?No outpatient medications have been marked as taking for the 03/30/22 encounter (Appointment) with [name], PA-C.  ?  ? ?Allergies:   Ipratropium, Metoprolol, Morphine and related, Vicodin [hydrocodone-acetaminophen], and Penicillins  ? ?Social History  ? ?Socioeconomic History  ? Marital status: Married  ?  Spouse name: Zerrick Hanssen  ? Number of children: 2   ? Years of education: Not on file  ? Highest education level: 11th grade  ?Occupational History  ? 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 Smoking status: Former  ?  Types: Cigarettes  ?  Quit date: 11/30/2012  ?  Years since quitting: 9.3  ? Smokeless tobacco: Never  ?Vaping Use  ? Vaping Use: Never used  ?Substance and Sexual Activity  ? Alcohol use: No  ?  Comment: last drink 10 + years  ? Drug use: No  ? Sexual activity: Not on file  ?Other Topics Concern  ? Not on file  ?Social History Narrative  ? Not on file  ? ?Social Determinants of Health  ? ?Financial Resource Strain: Low Risk   ? Difficulty of Paying Living Expenses: Not hard at all  ?Food Insecurity: No Food Insecurity  ? Worried About Charity fundraiser in the Last Year: Never true  ? Ran Out of Food in the Last Year: Never true  ?Transportation Needs: No Transportation Needs  ? Lack of Transportation (Medical): No  ?  Lack of Transportation (Non-Medical): No  ?Physical Activity: Not on file  ?Stress: Not on file  ?Social Connections: Not on file  ?  ? ?Family History: ?The patient's family history includes Heart disease in his mother and sister; Hypertension in his mother and sister; Thyroid disease in his brother and sister. ? ?ROS:   ?Please see the history of present illness.    ? ? ?EKGs/Labs/Other Studies Reviewed:   ? ?The following studies were reviewed today: ? ?Left Cardiac Catheterization 12/29/2017: ?Mid LM lesion is 75% stenosed. ?Ost LAD to Prox LAD lesion is 100% stenosed. ?Ost 1st Mrg lesion is 100% stenosed. ?Prox Cx to Mid Cx lesion is 100% stenosed. ?Prox RCA to Mid RCA lesion is 100% stenosed. ?LIMA graft was visualized by angiography and is normal in caliber. ?The graft exhibits no disease. ?Seq SVG- OM 1 and PDA graft was visualized by angiography and is normal in caliber. ?SVG graft was visualized by angiography and is normal in caliber. ?Origin lesion before 1st Mrg is 50% stenosed. ?LV end  diastolic pressure is moderately elevated. ?  ?1. Severe 3 vessel occlusive CAD ?2. Patent LIMA to the LAD ?3. Patent sequential SVG to the first OM and PDA. Patent Y graft SVG to the diagonal ?4. Moderately el

## 2022-03-21 NOTE — Progress Notes (Signed)
? ? ?HEART & VASCULAR TRANSITION OF CARE CONSULT NOTE  ? ? ? ?Referring Physician: Dr. Broadus John  ?Primary Care: Loura Pardon, MD ?Primary Cardiologist: Donato Heinz, MD ? ? ?HPI: ?Referred to clinic by Dr. Broadus John, internal medicine, for heart failure consultation.  ? 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 Joelyn Oms, COPD, HTN, HLD and BPH ? ?CABG done at [facility] 2014. Last cath here 12/29/17 showed patent grafts. Unusual Y graft to D1, OM and PDA and patent LIMA to LAD.  ? ?Recent admit to [facility] earlier this month for chest pain. Hs trop 633>>4,100>>2,636. Echo showed mildly reduced LVEF, 40-45%, RV normal, mild MR, mild-mod TR. Given Stage IV CKD w/ baseline SCr of 2.7, decision was made to treat NSTEMI medically. He was placed on IV heparin x 48 hs. HS trop showed downward trend and he had no further CP. He was also noted to be fluid overloaded, in a/c CHF. Diuresed w/ IV Lasix. Nephrology was also consulted and assisted in diuretic dose adjustment. After diuresis, was switched to PO torsemide but dose was reduced from 80 mg bid to 40 mg bid. D/c wt 176 lb. Referred to [facility] clinic.  ? ?Presents to clinic today for f/u. Denies any further CP but still SOB w/ ADLs. NYHA Class II-early III. Denies resting dyspnea. No orthopnea but sleeps w/ CPAP. HR has been elevated at home, 120s-130s. EKG today shows atrial tachycardia/ ? Atypical atrial flutter 123 bpm (reviewed w/ Dr. Aundra Dubin). He reports full compliance w/ Eliquis (this was held during recent admit in anticipation for possible LHC/ management of NSTEMI but he was covered w/ IV heparin). Reports full compliance w/ CPAP. BP ok 138/74. Wt is up from post hospital from 176>>183 lb today. ReDs clip 36%.  ? ?Of note, TSH was checked recent hospitalization and was WNL.  ? ?Cardiac Testing  ? ?2D  Echo 03/08/22 ? 1. Left ventricular ejection fraction, by estimation, is 40 to 45%. The  ?left ventricle has mildly decreased function. The left ventricle  ?demonstrates regional wall motion abnormalities (see scoring  ?diagram/findings for description). Left ventricular  ?diastolic parameters are consistent with Grade I diastolic dysfunction  ?(impaired relaxation).  ? 2. Right ventricular systolic function is normal. The right ventricular  ?size is normal. There is normal pulmonary artery systolic pressure.  ? 3. Left atrial size was mildly dilated.  ? 4. Right atrial size was mildly dilated.  ? 5. The mitral valve is normal in structure. Trivial mitral valve  ?regurgitation.  ? 6. Tricuspid valve regurgitation is mild to moderate.  ? 7. The aortic valve is tricuspid. Aortic valve regurgitation is not  ?visualized.  ? 8. The inferior vena cava is normal in size with greater than 50%  ?respiratory variability, suggesting right atrial pressure of 3 mmHg.  ? ? ?Review of Systems: [y] = yes, [ ]  = no  ? ?General: Weight gain [ Y]; Weight loss [ ] ; Anorexia [ ] ; Fatigue [ ] ; Fever [ ] ; Chills [ ] ; Weakness [ ]   ?Cardiac: Chest pain/pressure [ ] ; Resting SOB [ ] ; Exertional SOB [Y ]; Orthopnea [ ] ; Pedal Edema [ ] ; Palpitations [ ] ; Syncope [ ] ; Presyncope [ ] ; Paroxysmal nocturnal dyspnea[ ]   ?Pulmonary: Cough [ ] ; Wheezing[ ] ; Hemoptysis[ ] ; Sputum [ ] ; Snoring [ ]   ?GI: Vomiting[ ] ; Dysphagia[ ] ;  Melena[ ] ; Hematochezia [ ] ; Heartburn[ ] ; Abdominal pain [ ] ; Constipation [ ] ; Diarrhea [ ] ; BRBPR [ ]   ?GU: Hematuria[ ] ; Dysuria [ ] ; Nocturia[ ]   ?Vascular: Pain in legs with walking [ ] ; Pain in feet with lying flat [ ] ; Non-healing sores [ ] ; Stroke [ ] ; TIA [ ] ; Slurred speech [ ] ;  ?Neuro: Headaches[ ] ; Vertigo[ ] ; Seizures[ ] ; Paresthesias[ ] ;Blurred vision [ ] ; Diplopia [ ] ; Vision changes [ ]   ?Ortho/Skin: Arthritis [ ] ; Joint pain [ ] ; Muscle pain [ ] ; Joint swelling [ ] ; Back Pain [ ] ; Rash [ ]   ?Psych:  Depression[ ] ; Anxiety[ ]   ?Heme: Bleeding problems [ ] ; Clotting disorders [ ] ; Anemia [ ]   ?Endocrine: Diabetes [ ] ; Thyroid dysfunction[ ]  ? ? ?Past Medical History:  ?Diagnosis Date  ? Abrasion of forearm, right 07/27/2019  ? Acute respiratory failure ([place])   ? AF (paroxysmal atrial fibrillation) ([place])   ? AICD (automatic cardioverter/defibrillator) present   ? CAD (coronary artery disease)   ? Cardiomyopathy, ischemic   ? Carotid artery occlusion   ? Chronic kidney disease   ? COPD (chronic obstructive pulmonary disease) ([place])   ? Cough 11/13/2019  ? Dementia without behavioral disturbance ([place])   ? Dizziness 01/17/2016  ? Facial rash 08/15/2018  ? GERD (gastroesophageal reflux disease)   ? Hyperlipidemia   ? Hypertension   ? Pre-syncope 01/16/2018  ? Respiratory distress 12/27/2020  ? Sleep apnea   ? Stroke [facility])   ? TIA (transient ischemic attack)   ? ? ?Current Outpatient Medications  ?Medication Sig Dispense Refill  ? albuterol (ACCUNEB) 0.63 MG/3ML nebulizer solution Take 3 mLs by nebulization 4 (four) times daily as needed for shortness of breath or wheezing. 3 ml inhale orally via nebulizer four times a day    ? albuterol (PROVENTIL HFA;VENTOLIN HFA) 108 (90 Base) MCG/ACT inhaler Inhale 2 puffs into the lungs every 6 (six) hours as needed for wheezing or shortness of breath.    ? allopurinol (ZYLOPRIM) 100 MG tablet Take 200 mg by mouth daily.    ? atorvastatin (LIPITOR) 40 MG tablet TAKE 1 TABLET EVERY DAY 90 tablet 2  ? cetirizine (ZYRTEC) 10 MG tablet Take 10 mg by mouth daily.    ? cholecalciferol (VITAMIN D) 1000 units tablet Take 1,000 Units by mouth daily.    ? Cyanocobalamin (VITAMIN B 12 PO) Take 1 tablet by mouth daily.    ? ELIQUIS 5 MG TABS tablet TAKE 1 TABLET TWICE DAILY 180 tablet 1  ? ferrous sulfate 325 (65 FE) MG tablet Take 325 mg by mouth daily with breakfast.    ? folic acid (FOLVITE) 962 MCG tablet Take 400 mcg by mouth daily.    ? labetalol (NORMODYNE) 300 MG tablet Take 1 tablet (300 mg  total) by mouth 2 (two) times daily. 180 tablet 2  ? Multiple Vitamin (MULTIVITAMIN) tablet Take 1 tablet by mouth daily.    ? nitroGLYCERIN (NITROSTAT) 0.4 MG SL tablet PLACE 1 TABLET (0.4 MG TOTAL) UNDER THE TONGUE EVERY 5 (FIVE) MINUTES AS NEEDED FOR CHEST PAIN. 75 tablet 2  ? torsemide (DEMADEX) 20 MG tablet Take 2 tablets (40 mg total) by mouth 2 (two) times daily.    ? TRELEGY ELLIPTA 100-62.5-25 MCG/ACT AEPB Inhale 1 puff into the lungs daily.    ? ?No current facility-administered medications for this encounter.  ? ? ?Allergies  ?Allergen Reactions  ? Ipratropium Other (See Comments)  ? Metoprolol   ?  Was bringing blood pressure too low and he would pass out.  ? Morphine And Related Nausea Only  ? Vicodin [Hydrocodone-Acetaminophen] Other (See Comments)  ?  Hallucinations ?  ? Penicillins Rash  ?  Did it involve swelling of the face/tongue/throat, SOB, or low BP? No ?Did it involve sudden or severe rash/hives, skin peeling, or any reaction on the inside of your mouth or nose? Yes ?Did you need to seek medical attention at a hospital or doctor's office? No ?When did it last happen? Over 7 Years Ago      ?If all above answers are "NO", may proceed with cephalosporin use. ? ?  ? ? ?  ?Social History  ? ?Socioeconomic History  ? Marital status: Married  ?  Spouse name: Curtez Brallier  ? Number of children: 2  ? Years of education: Not on file  ? Highest education level: 11th grade  ?Occupational History  ? 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 Smoking status: Former  ?  Types: Cigarettes  ?  Quit date: 11/30/2012  ?  Years since quitting: 9.3  ? Smokeless tobacco: Never  ?Vaping Use  ? Vaping Use: Never used  ?Substance and Sexual Activity  ? Alcohol use: No  ?  Comment: last drink 10 + years  ? Drug use: No  ? Sexual activity: Not on file  ?Other Topics Concern  ? Not on file  ?Social History Narrative  ? Not on file  ? ?Social Determinants of Health  ? ?Financial Resource Strain:  Low Risk   ? Difficulty of Paying Living Expenses: Not hard at all  ?Food Insecurity: No Food Insecurity  ? Worried About Charity fundraiser in the Last Year: Never true  ? Ran Out of Food in the Last Year: N

## 2022-03-22 ENCOUNTER — Ambulatory Visit (HOSPITAL_COMMUNITY)
Admission: RE | Admit: 2022-03-22 | Discharge: 2022-03-22 | Disposition: A | Discharge status: Home / Self Care | Payer: Medicare HMO | Source: Ambulatory Visit | Attending: Cardiology | Admitting: Cardiology

## 2022-03-22 ENCOUNTER — Other Ambulatory Visit: Payer: Self-pay

## 2022-03-22 ENCOUNTER — Telehealth (HOSPITAL_COMMUNITY): Payer: Self-pay | Admitting: *Deleted

## 2022-03-22 ENCOUNTER — Other Ambulatory Visit (HOSPITAL_COMMUNITY): Payer: Self-pay | Admitting: Cardiology

## 2022-03-22 VITALS — BP 138/74 | HR 130 | Wt 183.0 lb

## 2022-03-22 DIAGNOSIS — N184 Chronic kidney disease, stage 4 (severe): Secondary | ICD-10-CM | POA: Insufficient documentation

## 2022-03-22 DIAGNOSIS — I130 Hypertensive heart and chronic kidney disease with heart failure and stage 1 through stage 4 chronic kidney disease, or unspecified chronic kidney disease: Secondary | ICD-10-CM | POA: Diagnosis not present

## 2022-03-22 DIAGNOSIS — Z951 Presence of aortocoronary bypass graft: Secondary | ICD-10-CM | POA: Diagnosis not present

## 2022-03-22 DIAGNOSIS — I480 Paroxysmal atrial fibrillation: Secondary | ICD-10-CM | POA: Insufficient documentation

## 2022-03-22 DIAGNOSIS — I471 Supraventricular tachycardia: Secondary | ICD-10-CM | POA: Insufficient documentation

## 2022-03-22 DIAGNOSIS — N400 Enlarged prostate without lower urinary tract symptoms: Secondary | ICD-10-CM | POA: Diagnosis not present

## 2022-03-22 DIAGNOSIS — I493 Ventricular premature depolarization: Secondary | ICD-10-CM | POA: Insufficient documentation

## 2022-03-22 DIAGNOSIS — R9431 Abnormal electrocardiogram [ECG] [EKG]: Secondary | ICD-10-CM | POA: Diagnosis not present

## 2022-03-22 DIAGNOSIS — J449 Chronic obstructive pulmonary disease, unspecified: Secondary | ICD-10-CM | POA: Diagnosis not present

## 2022-03-22 DIAGNOSIS — Z9989 Dependence on other enabling machines and devices: Secondary | ICD-10-CM | POA: Insufficient documentation

## 2022-03-22 DIAGNOSIS — I484 Atypical atrial flutter: Secondary | ICD-10-CM | POA: Insufficient documentation

## 2022-03-22 DIAGNOSIS — Z8349 Family history of other endocrine, nutritional and metabolic diseases: Secondary | ICD-10-CM | POA: Diagnosis not present

## 2022-03-22 DIAGNOSIS — E785 Hyperlipidemia, unspecified: Secondary | ICD-10-CM | POA: Insufficient documentation

## 2022-03-22 DIAGNOSIS — I2510 Atherosclerotic heart disease of native coronary artery without angina pectoris: Secondary | ICD-10-CM | POA: Insufficient documentation

## 2022-03-22 DIAGNOSIS — Z79899 Other long term (current) drug therapy: Secondary | ICD-10-CM | POA: Insufficient documentation

## 2022-03-22 DIAGNOSIS — G4733 Obstructive sleep apnea (adult) (pediatric): Secondary | ICD-10-CM | POA: Insufficient documentation

## 2022-03-22 DIAGNOSIS — Z7901 Long term (current) use of anticoagulants: Secondary | ICD-10-CM | POA: Diagnosis not present

## 2022-03-22 DIAGNOSIS — I491 Atrial premature depolarization: Secondary | ICD-10-CM | POA: Insufficient documentation

## 2022-03-22 DIAGNOSIS — I255 Ischemic cardiomyopathy: Secondary | ICD-10-CM | POA: Diagnosis not present

## 2022-03-22 DIAGNOSIS — I5022 Chronic systolic (congestive) heart failure: Secondary | ICD-10-CM | POA: Insufficient documentation

## 2022-03-22 DIAGNOSIS — Z8249 Family history of ischemic heart disease and other diseases of the circulatory system: Secondary | ICD-10-CM | POA: Diagnosis not present

## 2022-03-22 DIAGNOSIS — I252 Old myocardial infarction: Secondary | ICD-10-CM | POA: Diagnosis not present

## 2022-03-22 DIAGNOSIS — Z95810 Presence of automatic (implantable) cardiac defibrillator: Secondary | ICD-10-CM | POA: Insufficient documentation

## 2022-03-22 LAB — CBC
HCT: 34.4 % — ABNORMAL LOW (ref 39.0–52.0)
Hemoglobin: 11.3 g/dL — ABNORMAL LOW (ref 13.0–17.0)
MCH: 31.1 pg (ref 26.0–34.0)
MCHC: 32.8 g/dL (ref 30.0–36.0)
MCV: 94.8 fL (ref 80.0–100.0)
Platelets: 326 10*3/uL (ref 150–400)
RBC: 3.63 MIL/uL — ABNORMAL LOW (ref 4.22–5.81)
RDW: 15.1 % (ref 11.5–15.5)
WBC: 8 10*3/uL (ref 4.0–10.5)
nRBC: 0 % (ref 0.0–0.2)

## 2022-03-22 LAB — COMPREHENSIVE METABOLIC PANEL
ALT: 19 U/L (ref 0–44)
AST: 21 U/L (ref 15–41)
Albumin: 3.7 g/dL (ref 3.5–5.0)
Alkaline Phosphatase: 126 U/L (ref 38–126)
Anion gap: 9 (ref 5–15)
BUN: 24 mg/dL — ABNORMAL HIGH (ref 8–23)
CO2: 23 mmol/L (ref 22–32)
Calcium: 9.8 mg/dL (ref 8.9–10.3)
Chloride: 105 mmol/L (ref 98–111)
Creatinine, Ser: 2.81 mg/dL — ABNORMAL HIGH (ref 0.61–1.24)
GFR, Estimated: 23 mL/min — ABNORMAL LOW (ref >60)
Glucose, Bld: 126 mg/dL — ABNORMAL HIGH (ref 70–99)
Potassium: 3.8 mmol/L (ref 3.5–5.1)
Sodium: 137 mmol/L (ref 135–145)
Total Bilirubin: 0.4 mg/dL (ref 0.3–1.2)
Total Protein: 6.6 g/dL (ref 6.5–8.1)

## 2022-03-22 LAB — MAGNESIUM: Magnesium: 2.1 mg/dL (ref 1.7–2.4)

## 2022-03-22 LAB — BRAIN NATRIURETIC PEPTIDE: B Natriuretic Peptide: 297.9 pg/mL — ABNORMAL HIGH (ref 0.0–100.0)

## 2022-03-22 MED ORDER — CARVEDILOL 25 MG PO TABS: 37.5000 mg | ORAL_TABLET | Freq: Two times a day (BID) | ORAL | 0 refills | Status: DC

## 2022-03-22 MED ORDER — ASPIRIN EC 81 MG PO TBEC: 81.0000 mg | DELAYED_RELEASE_TABLET | Freq: Every day | ORAL | 3 refills | Status: AC

## 2022-03-22 NOTE — Telephone Encounter (Signed)
Heart Failure Nurse Navigator Progress Note  ? ?Spoke with patients wife [name], with [place] reminder, she confirmed they will be here.  ? ?Earnestine Leys, BSN, RN ?Heart Failure Nurse Navigator ?[phone]  ?

## 2022-03-22 NOTE — Patient Instructions (Signed)
Increase Torsemide to 60 mg (3 tabs) Twice daily FOR 1 DAY ONLY, then back to 40 mg (2 tabs) Twice daily  ? ?STOP Labetalol ? ?START Carvedilol 37.5 mg (1 & 1/2 tabs) Twice daily  ? ?START Aspirin 81 mg Daily  ? ?Labs done today, we will call you for abnormal results ? ?You have been referred to the Afib Clinic on Wednesday 03/23/22 at 9:00 AM ? ?Thank you for allowing [place] to provider your heart failure care after your recent hospitalization. Please follow-up with Dr Gardiner Rhyme as scheduled on 03/30/22 ? ?Do the following things EVERYDAY: ?Weigh yourself in the morning before breakfast. Write it down and keep it in a log. ?Take your medicines as prescribed ?Eat low salt foods--Limit salt (sodium) to 2000 mg per day.  ?Stay as active as you can everyday ?Limit all fluids for the day to less than 2 liters ? ?

## 2022-03-22 NOTE — Telephone Encounter (Signed)
Pt insurance is active and benefits verified through [facility]. Co-pay $10.00, DED $0.00/$0.00 met, out of pocket $3,400.00/$52.41 met, co-insurance 0%. No pre-authorization required. Passport, 03/22/22 @ 8:20AM, RRN#[id number] ?  ?Will contact patient to see if he is interested in the Cardiac Rehab Program. If interested, patient will need to complete follow up appt. Once completed, patient will be contacted for scheduling upon review by the RN Navigator. ?

## 2022-03-22 NOTE — Telephone Encounter (Signed)
Called and spoke with pt wife [name] in regards to CR, stated pt is interested. Explained scheduling process and went over insurance, patient verbalized understanding. Will contact patient for scheduling once f/u has been completed.  ?

## 2022-03-22 NOTE — Progress Notes (Signed)
ReDS Vest / Clip - 03/22/22 0900   ? ?  ? ReDS Vest / Clip  ? Station Marker C   ? Ruler Value 27   ? ReDS Value Range Moderate volume overload   ? ReDS Actual Value 36   ? ?  ?  ? ?  ? ? ?

## 2022-03-23 ENCOUNTER — Encounter (HOSPITAL_COMMUNITY): Payer: Self-pay | Admitting: Nurse Practitioner

## 2022-03-23 ENCOUNTER — Ambulatory Visit (HOSPITAL_COMMUNITY)
Admission: RE | Admit: 2022-03-23 | Discharge: 2022-03-23 | Disposition: A | Discharge status: Home / Self Care | Payer: Medicare HMO | Source: Ambulatory Visit | Attending: Nurse Practitioner | Admitting: Nurse Practitioner

## 2022-03-23 VITALS — BP 142/76 | HR 124 | Ht 69.0 in | Wt 183.0 lb

## 2022-03-23 DIAGNOSIS — N184 Chronic kidney disease, stage 4 (severe): Secondary | ICD-10-CM | POA: Diagnosis not present

## 2022-03-23 DIAGNOSIS — I2510 Atherosclerotic heart disease of native coronary artery without angina pectoris: Secondary | ICD-10-CM | POA: Diagnosis not present

## 2022-03-23 DIAGNOSIS — I130 Hypertensive heart and chronic kidney disease with heart failure and stage 1 through stage 4 chronic kidney disease, or unspecified chronic kidney disease: Secondary | ICD-10-CM | POA: Diagnosis not present

## 2022-03-23 DIAGNOSIS — G4733 Obstructive sleep apnea (adult) (pediatric): Secondary | ICD-10-CM | POA: Insufficient documentation

## 2022-03-23 DIAGNOSIS — I5022 Chronic systolic (congestive) heart failure: Secondary | ICD-10-CM | POA: Diagnosis not present

## 2022-03-23 DIAGNOSIS — Z951 Presence of aortocoronary bypass graft: Secondary | ICD-10-CM | POA: Insufficient documentation

## 2022-03-23 DIAGNOSIS — I4819 Other persistent atrial fibrillation: Secondary | ICD-10-CM | POA: Diagnosis not present

## 2022-03-23 DIAGNOSIS — I252 Old myocardial infarction: Secondary | ICD-10-CM | POA: Insufficient documentation

## 2022-03-23 DIAGNOSIS — R0602 Shortness of breath: Secondary | ICD-10-CM | POA: Insufficient documentation

## 2022-03-23 DIAGNOSIS — M109 Gout, unspecified: Secondary | ICD-10-CM | POA: Insufficient documentation

## 2022-03-23 DIAGNOSIS — I255 Ischemic cardiomyopathy: Secondary | ICD-10-CM | POA: Diagnosis not present

## 2022-03-23 DIAGNOSIS — Z7901 Long term (current) use of anticoagulants: Secondary | ICD-10-CM | POA: Diagnosis not present

## 2022-03-23 DIAGNOSIS — J449 Chronic obstructive pulmonary disease, unspecified: Secondary | ICD-10-CM | POA: Insufficient documentation

## 2022-03-23 DIAGNOSIS — E785 Hyperlipidemia, unspecified: Secondary | ICD-10-CM | POA: Diagnosis not present

## 2022-03-23 DIAGNOSIS — I480 Paroxysmal atrial fibrillation: Secondary | ICD-10-CM | POA: Insufficient documentation

## 2022-03-23 DIAGNOSIS — Z9989 Dependence on other enabling machines and devices: Secondary | ICD-10-CM | POA: Diagnosis not present

## 2022-03-23 NOTE — Progress Notes (Signed)
? ?Primary Care Physician: Joe Pardon, MD ?Referring Physician: Ellen Henri, PA  ?Cardiology: Dr. Gardiner Wiggins  ? ? ?Joe Wiggins is a 76 y.o. male with a h/o multivessel CAD status post CABG x4, PAF on Eliquis, chronic combined HFrEF and HFpEF secondary to ischemic cardiomyopathy status post AICD, CKD stage IV, COPD, HTN, HLD, BPH, gout, presented to [facility] ER 3/13 with new onset of chest pain, troponin peaking  at 4110, dx NSTEMI, also noted to be fluid overloaded, treated with IV heparin, diuretics. He was d/c in SR. Anticoagulation was held in case of LHC but he covered during that time with heparin drip and then transitioned back to eliquis 5 mg bid.  ? ?He was seen in the AHF clinic [facility] clinic yesterday and found to be back in afib. He was changed yesterday from  labetalol to coreg 37.5 mg bid for better heart rate control. He took this am. He was also told to take an extra 1/2 torsemide yesterday  for wt gain. Today, weight is up 4 lbs. Wife states he had a salty dinner last night.  He usually avoids the salt shaker but eats salty foods otherwise. He feels short of breath and fatigued in afib. V rates are in the 120's today. I was asked to get pt set up for cardioversion. ? ?Today, he denies symptoms of palpitations, chest pain, shortness of breath, orthopnea, PND, lower extremity edema, dizziness, presyncope, syncope, or neurologic sequela. The patient is tolerating medications without difficulties and is otherwise without complaint today.  ? ?Past Medical History:  ?Diagnosis Date  ? Abrasion of forearm, right 07/27/2019  ? Acute respiratory failure ([place])   ? AF (paroxysmal atrial fibrillation) ([place])   ? AICD (automatic cardioverter/defibrillator) present   ? CAD (coronary artery disease)   ? Cardiomyopathy, ischemic   ? Carotid artery occlusion   ? Chronic kidney disease   ? COPD (chronic obstructive pulmonary disease) ([place])   ? Cough 11/13/2019  ? Dementia without behavioral disturbance ([place])   ?  Dizziness 01/17/2016  ? Facial rash 08/15/2018  ? GERD (gastroesophageal reflux disease)   ? Hyperlipidemia   ? Hypertension   ? Pre-syncope 01/16/2018  ? Respiratory distress 12/27/2020  ? Sleep apnea   ? Stroke [facility])   ? TIA (transient ischemic attack)   ? ?Past Surgical History:  ?Procedure Laterality Date  ? CARDIAC PACEMAKER PLACEMENT    ? CATARACT EXTRACTION    ? CORONARY ARTERY BYPASS GRAFT    ? CORONARY STENT PLACEMENT    ? EYE SURGERY    ? ICD IMPLANT N/A 12/29/2017  ? Procedure: ICD IMPLANT;  Surgeon: Joe Lance, MD;  Location: [place] [facility];  Service: Cardiovascular;  Laterality: N/A;  ? LEFT HEART CATH AND CORS/GRAFTS ANGIOGRAPHY N/A 12/29/2017  ? Procedure: LEFT HEART CATH AND CORS/GRAFTS ANGIOGRAPHY;  Surgeon: Wiggins, Joe M, MD;  Location: [place] [facility];  Service: Cardiovascular;  Laterality: N/A;  ? ? ?Current Outpatient Medications  ?Medication Sig Dispense Refill  ? albuterol (ACCUNEB) 0.63 MG/3ML nebulizer solution Take 3 mLs by nebulization 4 (four) times daily as needed for shortness of breath or wheezing. 3 ml inhale orally via nebulizer four times a day    ? albuterol (PROVENTIL HFA;VENTOLIN HFA) 108 (90 Base) MCG/ACT inhaler Inhale 2 puffs into the lungs every 6 (six) hours as needed for wheezing or shortness of breath.    ? allopurinol (ZYLOPRIM) 100 MG tablet Take 200 mg by mouth daily.    ? aspirin  EC 81 MG tablet Take 1 tablet (81 mg total) by mouth daily. Swallow whole. 90 tablet 3  ? atorvastatin (LIPITOR) 40 MG tablet TAKE 1 TABLET EVERY DAY 90 tablet 2  ? carvedilol (COREG) 25 MG tablet Take 1.5 tablets (37.5 mg total) by mouth 2 (two) times daily. 90 tablet 0  ? cetirizine (ZYRTEC) 10 MG tablet Take 10 mg by mouth daily.    ? cholecalciferol (VITAMIN D) 1000 units tablet Take 1,000 Units by mouth daily.    ? Cyanocobalamin (VITAMIN B 12 PO) Take 1 tablet by mouth daily.    ? ELIQUIS 5 MG TABS tablet TAKE 1 TABLET TWICE DAILY 180 tablet 1  ? ferrous sulfate 325 (65 FE) MG  tablet Take 325 mg by mouth daily with breakfast.    ? folic acid (FOLVITE) 355 MCG tablet Take 400 mcg by mouth daily.    ? Multiple Vitamin (MULTIVITAMIN) tablet Take 1 tablet by mouth daily.    ? nitroGLYCERIN (NITROSTAT) 0.4 MG SL tablet PLACE 1 TABLET (0.4 MG TOTAL) UNDER THE TONGUE EVERY 5 (FIVE) MINUTES AS NEEDED FOR CHEST PAIN. 75 tablet 2  ? torsemide (DEMADEX) 20 MG tablet Take 2 tablets (40 mg total) by mouth 2 (two) times daily.    ? TRELEGY ELLIPTA 100-62.5-25 MCG/ACT AEPB Inhale 1 puff into the lungs daily.    ? ?No current facility-administered medications for this encounter.  ? ? ?Allergies  ?Allergen Reactions  ? Ipratropium Other (See Comments)  ? Metoprolol   ?  Was bringing blood pressure too low and he would pass out.  ? Morphine And Related Nausea Only  ? Vicodin [Hydrocodone-Acetaminophen] Other (See Comments)  ?  Hallucinations ?  ? Penicillins Rash  ?  Did it involve swelling of the face/tongue/throat, SOB, or low BP? No ?Did it involve sudden or severe rash/hives, skin peeling, or any reaction on the inside of your mouth or nose? Yes ?Did you need to seek medical attention at a hospital or doctor's office? No ?When did it last happen? Over 7 Years Ago      ?If all above answers are "NO", may proceed with cephalosporin use. ? ?  ? ? ?Social History  ? ?Socioeconomic History  ? Marital status: Married  ?  Spouse name: Joe Wiggins  ? Number of children: 2  ? Years of education: Not on file  ? Highest education level: 11th grade  ?Occupational History  ? 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 Smoking status: Former  ?  Types: Cigarettes  ?  Quit date: 11/30/2012  ?  Years since quitting: 9.3  ? Smokeless tobacco: Never  ?Vaping Use  ? Vaping Use: Never used  ?Substance and Sexual Activity  ? Alcohol use: No  ?  Comment: last drink 10 + years  ? Drug use: No  ? Sexual activity: Not on file  ?Other Topics Concern  ? Not on file  ?Social History Narrative  ? Not on  file  ? ?Social Determinants of Health  ? ?Financial Resource Strain: Low Risk   ? Difficulty of Paying Living Expenses: Not hard at all  ?Food Insecurity: No Food Insecurity  ? Worried About Charity fundraiser in the Last Year: Never true  ? Ran Out of Food in the Last Year: Never true  ?Transportation Needs: No Transportation Needs  ? Lack of Transportation (Medical): No  ? Lack of Transportation (Non-Medical): No  ?Physical Activity: Not on file  ?  Stress: Not on file  ?Social Connections: Not on file  ?Intimate Partner Violence: Not on file  ? ? ?Family History  ?Problem Relation Age of Onset  ? Hypertension Mother   ? Heart disease Mother   ? Hypertension Sister   ? Heart disease Sister   ? Thyroid disease Sister   ? Thyroid disease Brother   ?     hyperthyroid  ? ? ?ROS- All systems are reviewed and negative except as per the HPI above ? ?Physical Exam: ?Vitals:  ? 03/23/22 0837  ?BP: (!) 142/76  ?Pulse: (!) 124  ?Weight: 83 kg  ?Height: 5\' 9"  (1.753 m)  ? ?Wt Readings from Last 3 Encounters:  ?03/23/22 83 kg  ?03/22/22 83 kg  ?03/11/22 80.4 kg  ? ? ?Labs: ?Lab Results  ?Component Value Date  ? NA 137 03/22/2022  ? K 3.8 03/22/2022  ? CL 105 03/22/2022  ? CO2 23 03/22/2022  ? GLUCOSE 126 (H) 03/22/2022  ? BUN 24 (H) 03/22/2022  ? CREATININE 2.81 (H) 03/22/2022  ? CALCIUM 9.8 03/22/2022  ? PHOS 3.6 01/01/2018  ? MG 2.1 03/22/2022  ? ?Lab Results  ?Component Value Date  ? INR 1.6 (H) 12/27/2020  ? ?Lab Results  ?Component Value Date  ? CHOL 144 03/09/2022  ? HDL 30 (L) 03/09/2022  ? [place] 83 03/09/2022  ? TRIG 157 (H) 03/09/2022  ? ? ? ?GEN- The patient is well appearing, alert and oriented x 3 today.   ?Head- normocephalic, atraumatic ?Eyes-  Sclera clear, conjunctiva pink ?Ears- hearing intact ?Oropharynx- clear ?Neck- supple, no JVP ?Lymph- no cervical lymphadenopathy ?Lungs- Clear to ausculation bilaterally, normal work of breathing ?Heart- irregular rate and rhythm, no murmurs, rubs or gallops, PMI not  laterally displaced ?GI- soft, NT, ND, + BS ?Extremities- no clubbing, cyanosis, or edema ?MS- no significant deformity or atrophy ?Skin- no rash or lesion ?Psych- euthymic mood, full affect ?Neuro- strength and se

## 2022-03-23 NOTE — Patient Instructions (Addendum)
Take an extra 1/2 of toresmide today ? ? ? ?Cardioversion scheduled for Wednesday, April 12th ? - Arrive at the [facility] and go to admitting at 830am ? - Do not eat or drink anything after midnight the night prior to your procedure. ? - Take all your morning medication (except diabetic medications) with a sip of water prior to arrival. ? - You will not be able to drive home after your procedure. ? - Do NOT miss any doses of your blood thinner - if you should miss a dose please notify our office immediately. ? - If you feel as if you go back into normal rhythm prior to scheduled cardioversion, please notify our office immediately. If your procedure is canceled in the cardioversion suite you will be charged a cancellation fee. ? ?

## 2022-03-24 ENCOUNTER — Telehealth (HOSPITAL_COMMUNITY): Payer: Self-pay | Admitting: *Deleted

## 2022-03-24 MED ORDER — CARVEDILOL 25 MG PO TABS: ORAL_TABLET | ORAL | 0 refills | Status: DC

## 2022-03-24 NOTE — Telephone Encounter (Signed)
Patient wife called stating patient is continuing to have intermittent HRs in the 120s. BP currently 121/67 HR 120 Discussed with Roderic Palau NP will increase coreg 50mg  in the AM and 37.5mg  in the PM. Pt wife verbalized understanding. ?

## 2022-03-29 NOTE — Progress Notes (Signed)
Attempted to obtain medical history via telephone, unable to reach at this time. I left a voicemail to return pre surgical testing department's phone call.  

## 2022-03-30 ENCOUNTER — Ambulatory Visit (HOSPITAL_COMMUNITY)
Admission: RE | Admit: 2022-03-30 | Discharge: 2022-03-30 | Disposition: A | Discharge status: Home / Self Care | Payer: Medicare HMO | Source: Ambulatory Visit | Attending: Nurse Practitioner | Admitting: Nurse Practitioner

## 2022-03-30 ENCOUNTER — Ambulatory Visit: Payer: Medicare HMO | Admitting: Student

## 2022-03-30 ENCOUNTER — Encounter (HOSPITAL_COMMUNITY): Payer: Self-pay | Admitting: Nurse Practitioner

## 2022-03-30 VITALS — BP 144/66 | HR 66 | Ht 69.0 in | Wt 185.4 lb

## 2022-03-30 DIAGNOSIS — D6869 Other thrombophilia: Secondary | ICD-10-CM

## 2022-03-30 DIAGNOSIS — Z951 Presence of aortocoronary bypass graft: Secondary | ICD-10-CM | POA: Diagnosis not present

## 2022-03-30 DIAGNOSIS — Z8679 Personal history of other diseases of the circulatory system: Secondary | ICD-10-CM | POA: Diagnosis not present

## 2022-03-30 DIAGNOSIS — Z7982 Long term (current) use of aspirin: Secondary | ICD-10-CM | POA: Diagnosis not present

## 2022-03-30 DIAGNOSIS — M109 Gout, unspecified: Secondary | ICD-10-CM | POA: Insufficient documentation

## 2022-03-30 DIAGNOSIS — I4819 Other persistent atrial fibrillation: Secondary | ICD-10-CM | POA: Diagnosis not present

## 2022-03-30 DIAGNOSIS — I2510 Atherosclerotic heart disease of native coronary artery without angina pectoris: Secondary | ICD-10-CM | POA: Diagnosis not present

## 2022-03-30 DIAGNOSIS — N184 Chronic kidney disease, stage 4 (severe): Secondary | ICD-10-CM | POA: Insufficient documentation

## 2022-03-30 DIAGNOSIS — G4733 Obstructive sleep apnea (adult) (pediatric): Secondary | ICD-10-CM | POA: Diagnosis not present

## 2022-03-30 DIAGNOSIS — I130 Hypertensive heart and chronic kidney disease with heart failure and stage 1 through stage 4 chronic kidney disease, or unspecified chronic kidney disease: Secondary | ICD-10-CM | POA: Insufficient documentation

## 2022-03-30 DIAGNOSIS — I255 Ischemic cardiomyopathy: Secondary | ICD-10-CM | POA: Insufficient documentation

## 2022-03-30 DIAGNOSIS — I5042 Chronic combined systolic (congestive) and diastolic (congestive) heart failure: Secondary | ICD-10-CM | POA: Diagnosis not present

## 2022-03-30 DIAGNOSIS — Z79899 Other long term (current) drug therapy: Secondary | ICD-10-CM | POA: Diagnosis not present

## 2022-03-30 DIAGNOSIS — I482 Chronic atrial fibrillation, unspecified: Secondary | ICD-10-CM | POA: Insufficient documentation

## 2022-03-30 DIAGNOSIS — I252 Old myocardial infarction: Secondary | ICD-10-CM | POA: Insufficient documentation

## 2022-03-30 DIAGNOSIS — Z7901 Long term (current) use of anticoagulants: Secondary | ICD-10-CM | POA: Insufficient documentation

## 2022-03-30 DIAGNOSIS — E785 Hyperlipidemia, unspecified: Secondary | ICD-10-CM | POA: Insufficient documentation

## 2022-03-30 NOTE — Progress Notes (Signed)
? ?Primary Care Physician: Loura Pardon, MD ?Referring Physician: Ellen Henri, PA  ?Cardiology: Dr. Gardiner Rhyme  ? ? ?Joe Wiggins is a 76 y.o. male with a h/o multivessel CAD status post CABG x4, PAF on Eliquis, chronic combined HFrEF and HFpEF secondary to ischemic cardiomyopathy status post AICD, CKD stage IV, COPD, HTN, HLD, BPH, gout, presented to [facility] ER 3/13 with new onset of chest pain, troponin peaking  at 4110, dx NSTEMI, also noted to be fluid overloaded, treated with IV heparin, diuretics. He was d/c in SR. Anticoagulation was held in case of LHC but he covered during that time with heparin drip and then transitioned back to eliquis 5 mg bid.  ? ?He was seen in the AHF clinic [facility] clinic yesterday and found to be back in afib. He was changed yesterday from  labetalol to coreg 37.5 mg bid for better heart rate control. He took this am. He was also told to take an extra 1/2 torsemide yesterday  for wt gain. Today, weight is up 4 lbs. Wife states he had a salty dinner last night.  He usually avoids the salt shaker but eats salty foods otherwise. He feels short of breath and fatigued in afib. V rates are in the 120's today. I was asked to get pt set up for cardioversion. ? ?F/u  in afib clinic, 4/4, after rate control adjustment. Coreg was increased to 50 mg am   and 37.5 mg pm for continued RVR, now he appears to be back in rhythm but with extra BB, his wife states that he wants to sleep a lot. He states that he feels improved. Weight appears stable to last visit. Will cancel cardioversion. ? ?Today, he denies symptoms of palpitations, chest pain, shortness of breath, orthopnea, PND, lower extremity edema, dizziness, presyncope, syncope, or neurologic sequela. The patient is tolerating medications without difficulties and is otherwise without complaint today.  ? ?Past Medical History:  ?Diagnosis Date  ? Abrasion of forearm, right 07/27/2019  ? Acute respiratory failure ([place])   ? AF (paroxysmal atrial  fibrillation) ([place])   ? AICD (automatic cardioverter/defibrillator) present   ? CAD (coronary artery disease)   ? Cardiomyopathy, ischemic   ? Carotid artery occlusion   ? Chronic kidney disease   ? COPD (chronic obstructive pulmonary disease) ([place])   ? Cough 11/13/2019  ? Dementia without behavioral disturbance ([place])   ? Dizziness 01/17/2016  ? Facial rash 08/15/2018  ? GERD (gastroesophageal reflux disease)   ? Hyperlipidemia   ? Hypertension   ? Pre-syncope 01/16/2018  ? Respiratory distress 12/27/2020  ? Sleep apnea   ? Stroke [facility])   ? TIA (transient ischemic attack)   ? ?Past Surgical History:  ?Procedure Laterality Date  ? CARDIAC PACEMAKER PLACEMENT    ? CATARACT EXTRACTION    ? CORONARY ARTERY BYPASS GRAFT    ? CORONARY STENT PLACEMENT    ? EYE SURGERY    ? ICD IMPLANT N/A 12/29/2017  ? Procedure: ICD IMPLANT;  Surgeon: Evans Lance, MD;  Location: [place] [facility];  Service: Cardiovascular;  Laterality: N/A;  ? LEFT HEART CATH AND CORS/GRAFTS ANGIOGRAPHY N/A 12/29/2017  ? Procedure: LEFT HEART CATH AND CORS/GRAFTS ANGIOGRAPHY;  Surgeon: Martinique, Peter M, MD;  Location: [place] [facility];  Service: Cardiovascular;  Laterality: N/A;  ? ? ?Current Outpatient Medications  ?Medication Sig Dispense Refill  ? albuterol (ACCUNEB) 0.63 MG/3ML nebulizer solution Take 3 mLs by nebulization 4 (four) times daily as needed for shortness of  breath or wheezing. 3 ml inhale orally via nebulizer four times a day    ? albuterol (PROVENTIL HFA;VENTOLIN HFA) 108 (90 Base) MCG/ACT inhaler Inhale 2 puffs into the lungs every 6 (six) hours as needed for wheezing or shortness of breath.    ? allopurinol (ZYLOPRIM) 100 MG tablet Take 200 mg by mouth daily.    ? aspirin EC 81 MG tablet Take 1 tablet (81 mg total) by mouth daily. Swallow whole. 90 tablet 3  ? atorvastatin (LIPITOR) 40 MG tablet TAKE 1 TABLET EVERY DAY 90 tablet 2  ? carvedilol (COREG) 25 MG tablet Take 50mg  in the AM and 37.5mg  in the PM 90 tablet 0  ? cetirizine  (ZYRTEC) 10 MG tablet Take 10 mg by mouth daily.    ? cholecalciferol (VITAMIN D) 1000 units tablet Take 1,000 Units by mouth daily.    ? Cyanocobalamin (VITAMIN B 12 PO) Take 1 tablet by mouth daily.    ? ELIQUIS 5 MG TABS tablet TAKE 1 TABLET TWICE DAILY 180 tablet 1  ? ferrous sulfate 325 (65 FE) MG tablet Take 325 mg by mouth daily with breakfast.    ? folic acid (FOLVITE) 469 MCG tablet Take 400 mcg by mouth daily.    ? Multiple Vitamin (MULTIVITAMIN) tablet Take 1 tablet by mouth daily.    ? nitroGLYCERIN (NITROSTAT) 0.4 MG SL tablet PLACE 1 TABLET (0.4 MG TOTAL) UNDER THE TONGUE EVERY 5 (FIVE) MINUTES AS NEEDED FOR CHEST PAIN. 75 tablet 2  ? torsemide (DEMADEX) 20 MG tablet Take 2 tablets (40 mg total) by mouth 2 (two) times daily.    ? TRELEGY ELLIPTA 100-62.5-25 MCG/ACT AEPB Inhale 1 puff into the lungs daily.    ? ?No current facility-administered medications for this encounter.  ? ? ?Allergies  ?Allergen Reactions  ? Ipratropium Other (See Comments)  ? Metoprolol   ?  Was bringing blood pressure too low and he would pass out.  ? Morphine And Related Nausea Only  ? Vicodin [Hydrocodone-Acetaminophen] Other (See Comments)  ?  Hallucinations ?  ? Penicillins Rash  ?  Did it involve swelling of the face/tongue/throat, SOB, or low BP? No ?Did it involve sudden or severe rash/hives, skin peeling, or any reaction on the inside of your mouth or nose? Yes ?Did you need to seek medical attention at a hospital or doctor's office? No ?When did it last happen? Over 7 Years Ago      ?If all above answers are "NO", may proceed with cephalosporin use. ? ?  ? ? ?Social History  ? ?Socioeconomic History  ? Marital status: Married  ?  Spouse name: Kamar Callender  ? Number of children: 2  ? Years of education: Not on file  ? Highest education level: 11th grade  ?Occupational History  ? 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 Smoking status: Former  ?  Types: Cigarettes  ?  Quit date: 11/30/2012  ?   Years since quitting: 9.3  ? Smokeless tobacco: Never  ?Vaping Use  ? Vaping Use: Never used  ?Substance and Sexual Activity  ? Alcohol use: No  ?  Comment: last drink 10 + years  ? Drug use: No  ? Sexual activity: Not on file  ?Other Topics Concern  ? Not on file  ?Social History Narrative  ? Not on file  ? ?Social Determinants of Health  ? ?Financial Resource Strain: Low Risk   ? Difficulty of Paying Living Expenses:  Not hard at all  ?Food Insecurity: No Food Insecurity  ? Worried About Charity fundraiser in the Last Year: Never true  ? Ran Out of Food in the Last Year: Never true  ?Transportation Needs: No Transportation Needs  ? Lack of Transportation (Medical): No  ? Lack of Transportation (Non-Medical): No  ?Physical Activity: Not on file  ?Stress: Not on file  ?Social Connections: Not on file  ?Intimate Partner Violence: Not on file  ? ? ?Family History  ?Problem Relation Age of Onset  ? Hypertension Mother   ? Heart disease Mother   ? Hypertension Sister   ? Heart disease Sister   ? Thyroid disease Sister   ? Thyroid disease Brother   ?     hyperthyroid  ? ? ?ROS- All systems are reviewed and negative except as per the HPI above ? ?Physical Exam: ?Vitals:  ? 03/30/22 1457  ?BP: (!) 144/66  ?Pulse: 66  ?Weight: 84.1 kg  ?Height: 5\' 9"  (1.753 m)  ? ?Wt Readings from Last 3 Encounters:  ?03/30/22 84.1 kg  ?03/23/22 83 kg  ?03/22/22 83 kg  ? ? ?Labs: ?Lab Results  ?Component Value Date  ? NA 137 03/22/2022  ? K 3.8 03/22/2022  ? CL 105 03/22/2022  ? CO2 23 03/22/2022  ? GLUCOSE 126 (H) 03/22/2022  ? BUN 24 (H) 03/22/2022  ? CREATININE 2.81 (H) 03/22/2022  ? CALCIUM 9.8 03/22/2022  ? PHOS 3.6 01/01/2018  ? MG 2.1 03/22/2022  ? ?Lab Results  ?Component Value Date  ? INR 1.6 (H) 12/27/2020  ? ?Lab Results  ?Component Value Date  ? CHOL 144 03/09/2022  ? HDL 30 (L) 03/09/2022  ? [place] 83 03/09/2022  ? TRIG 157 (H) 03/09/2022  ? ? ? ?GEN- The patient is well appearing, alert and oriented x 3 today.   ?Head-  normocephalic, atraumatic ?Eyes-  Sclera clear, conjunctiva pink ?Ears- hearing intact ?Oropharynx- clear ?Neck- supple, no JVP ?Lymph- no cervical lymphadenopathy ?Lungs- Clear to ausculation bilaterally, normal wo

## 2022-04-04 ENCOUNTER — Ambulatory Visit (HOSPITAL_COMMUNITY): Payer: Medicare HMO

## 2022-04-06 ENCOUNTER — Ambulatory Visit (HOSPITAL_COMMUNITY): Admit: 2022-04-06 | Payer: Medicare HMO | Admitting: Cardiology

## 2022-04-06 ENCOUNTER — Telehealth: Payer: Self-pay

## 2022-04-06 ENCOUNTER — Encounter (HOSPITAL_COMMUNITY): Payer: Self-pay

## 2022-04-06 SURGERY — CARDIOVERSION
Anesthesia: General

## 2022-04-06 NOTE — Telephone Encounter (Signed)
-----   Message from Juluis Mire, RN sent at 03/30/2022  3:41 PM EDT ----- ?Regarding: device transmission ?Pt to send transmission 4/18 can you forward this to donna as well. Thanks! Stacy ? ?

## 2022-04-12 ENCOUNTER — Ambulatory Visit (HOSPITAL_COMMUNITY): Payer: Medicare HMO

## 2022-04-12 ENCOUNTER — Telehealth: Payer: Self-pay

## 2022-04-12 NOTE — Telephone Encounter (Signed)
CorVue Impedance shows fluid retention daily impedance trending below reference impedance.  ? ?Spoke to patients wife Joe Wiggins, patient reports of increased shortness of breath with ambulation and unable to ambulate his normal distance. Denies shortness of breath at this time. States he does well when sleeping with cpap but if he doesn't wear it, he is not able to sleep. Compliant and no missed doses with torsdemide 40 mg BID.  ? ?Advised I will forward to Dr. Lovena Le for review.  ? ? ?

## 2022-04-12 NOTE — Telephone Encounter (Signed)
-----   Message from Juluis Mire, RN sent at 03/30/2022  3:41 PM EDT ----- ?Regarding: device transmission ?Pt to send transmission 4/18 can you forward this to donna as well. Thanks! Stacy ? ?

## 2022-04-13 ENCOUNTER — Ambulatory Visit (HOSPITAL_BASED_OUTPATIENT_CLINIC_OR_DEPARTMENT_OTHER)
Admission: RE | Admit: 2022-04-13 | Discharge: 2022-04-13 | Disposition: A | Discharge status: Home / Self Care | Payer: Medicare HMO | Source: Ambulatory Visit | Attending: Nurse Practitioner | Admitting: Nurse Practitioner

## 2022-04-13 ENCOUNTER — Ambulatory Visit (HOSPITAL_COMMUNITY): Payer: Medicare HMO

## 2022-04-13 VITALS — BP 158/64 | HR 66 | Ht 69.0 in | Wt 187.8 lb

## 2022-04-13 DIAGNOSIS — I480 Paroxysmal atrial fibrillation: Secondary | ICD-10-CM

## 2022-04-13 DIAGNOSIS — I2510 Atherosclerotic heart disease of native coronary artery without angina pectoris: Secondary | ICD-10-CM | POA: Insufficient documentation

## 2022-04-13 DIAGNOSIS — N184 Chronic kidney disease, stage 4 (severe): Secondary | ICD-10-CM | POA: Insufficient documentation

## 2022-04-13 DIAGNOSIS — Z951 Presence of aortocoronary bypass graft: Secondary | ICD-10-CM | POA: Insufficient documentation

## 2022-04-13 DIAGNOSIS — E785 Hyperlipidemia, unspecified: Secondary | ICD-10-CM | POA: Insufficient documentation

## 2022-04-13 DIAGNOSIS — Z955 Presence of coronary angioplasty implant and graft: Secondary | ICD-10-CM | POA: Insufficient documentation

## 2022-04-13 DIAGNOSIS — N400 Enlarged prostate without lower urinary tract symptoms: Secondary | ICD-10-CM | POA: Insufficient documentation

## 2022-04-13 DIAGNOSIS — Z79899 Other long term (current) drug therapy: Secondary | ICD-10-CM | POA: Insufficient documentation

## 2022-04-13 DIAGNOSIS — M109 Gout, unspecified: Secondary | ICD-10-CM | POA: Insufficient documentation

## 2022-04-13 DIAGNOSIS — I5042 Chronic combined systolic (congestive) and diastolic (congestive) heart failure: Secondary | ICD-10-CM | POA: Insufficient documentation

## 2022-04-13 DIAGNOSIS — I482 Chronic atrial fibrillation, unspecified: Secondary | ICD-10-CM | POA: Insufficient documentation

## 2022-04-13 DIAGNOSIS — I4892 Unspecified atrial flutter: Secondary | ICD-10-CM | POA: Insufficient documentation

## 2022-04-13 DIAGNOSIS — D6869 Other thrombophilia: Secondary | ICD-10-CM

## 2022-04-13 DIAGNOSIS — I252 Old myocardial infarction: Secondary | ICD-10-CM | POA: Insufficient documentation

## 2022-04-13 DIAGNOSIS — I255 Ischemic cardiomyopathy: Secondary | ICD-10-CM | POA: Insufficient documentation

## 2022-04-13 DIAGNOSIS — J449 Chronic obstructive pulmonary disease, unspecified: Secondary | ICD-10-CM | POA: Insufficient documentation

## 2022-04-13 DIAGNOSIS — Z8249 Family history of ischemic heart disease and other diseases of the circulatory system: Secondary | ICD-10-CM | POA: Insufficient documentation

## 2022-04-13 DIAGNOSIS — I130 Hypertensive heart and chronic kidney disease with heart failure and stage 1 through stage 4 chronic kidney disease, or unspecified chronic kidney disease: Secondary | ICD-10-CM | POA: Insufficient documentation

## 2022-04-13 DIAGNOSIS — I454 Nonspecific intraventricular block: Secondary | ICD-10-CM | POA: Insufficient documentation

## 2022-04-13 DIAGNOSIS — Z95810 Presence of automatic (implantable) cardiac defibrillator: Secondary | ICD-10-CM | POA: Insufficient documentation

## 2022-04-13 DIAGNOSIS — R9431 Abnormal electrocardiogram [ECG] [EKG]: Secondary | ICD-10-CM | POA: Insufficient documentation

## 2022-04-13 DIAGNOSIS — Z7901 Long term (current) use of anticoagulants: Secondary | ICD-10-CM | POA: Insufficient documentation

## 2022-04-13 DIAGNOSIS — Z7982 Long term (current) use of aspirin: Secondary | ICD-10-CM | POA: Insufficient documentation

## 2022-04-13 DIAGNOSIS — R0602 Shortness of breath: Secondary | ICD-10-CM | POA: Diagnosis not present

## 2022-04-13 MED ORDER — CARVEDILOL 25 MG PO TABS: ORAL_TABLET | ORAL | 2 refills | Status: DC

## 2022-04-13 NOTE — Progress Notes (Signed)
? ?Primary Care Physician: Loura Pardon, MD ?Referring Physician: Ellen Henri, PA  ?Cardiology: Dr. Gardiner Rhyme  ? ? ?Joe Wiggins is a 76 y.o. male with a h/o multivessel CAD status post CABG x4, PAF on Eliquis, chronic combined HFrEF and HFpEF secondary to ischemic cardiomyopathy status post AICD, CKD stage IV, COPD, HTN, HLD, BPH, gout, presented to [facility] ER 3/13 with new onset of chest pain, troponin peaking  at 4110, dx NSTEMI, also noted to be fluid overloaded, treated with IV heparin, diuretics. He was d/c in SR. Anticoagulation was held in case of LHC but he covered during that time with heparin drip and then transitioned back to eliquis 5 mg bid.  ? ?He was seen in the AHF clinic [facility] clinic yesterday and found to be back in raid flutter . He was changed yesterday from  labetalol to coreg 37.5 mg bid for better heart rate control. He took this am. He was also told to take an extra 1/2 torsemide yesterday  for wt gain. Today, weight is up 4 lbs. Wife states he had a salty dinner last night.  He usually avoids the salt shaker but eats salty foods otherwise. He feels short of breath and fatigued in afib. V rates are in the 120's today. I was asked to get pt set up for cardioversion. ? ?F/u  in afib clinic, 4/4, after rate control adjustment. Coreg was increased to 50 mg am   and 37.5 mg pm for continued RVR, now he appears to be back in rhythm but with extra BB, his wife states that he wants to sleep a lot. He states that he feels improved. Weight appears stable to last visit. Will cancel cardioversion. ? ?F/u in afib clinic, 04/13/22. He remains in sinus rhythm a remote transmission yesterday also confirmed SR with less than 1% afib burden since  February 2022. He was called from the device clinic re his device showing fluid. Device RN noted she would speak to Dr. Lovena Le about this and get back to pt. His weight is up around 3 lbs here. His creatinine runs around 2.8. he also sees nephrology. He is  compliant with torsemide 40 mg bid and will take an exta 1/2 tab of torsemide as needed. ? ?Today, he denies symptoms of palpitations, chest pain, shortness of breath, orthopnea, PND, lower extremity edema, dizziness, presyncope, syncope, or neurologic sequela. The patient is tolerating medications without difficulties and is otherwise without complaint today.  ? ?Past Medical History:  ?Diagnosis Date  ? Abrasion of forearm, right 07/27/2019  ? Acute respiratory failure ([place])   ? AF (paroxysmal atrial fibrillation) ([place])   ? AICD (automatic cardioverter/defibrillator) present   ? CAD (coronary artery disease)   ? Cardiomyopathy, ischemic   ? Carotid artery occlusion   ? Chronic kidney disease   ? COPD (chronic obstructive pulmonary disease) ([place])   ? Cough 11/13/2019  ? Dementia without behavioral disturbance ([place])   ? Dizziness 01/17/2016  ? Facial rash 08/15/2018  ? GERD (gastroesophageal reflux disease)   ? Hyperlipidemia   ? Hypertension   ? Pre-syncope 01/16/2018  ? Respiratory distress 12/27/2020  ? Sleep apnea   ? Stroke [facility])   ? TIA (transient ischemic attack)   ? ?Past Surgical History:  ?Procedure Laterality Date  ? CARDIAC PACEMAKER PLACEMENT    ? CATARACT EXTRACTION    ? CORONARY ARTERY BYPASS GRAFT    ? CORONARY STENT PLACEMENT    ? EYE SURGERY    ? ICD IMPLANT N/A  12/29/2017  ? Procedure: ICD IMPLANT;  Surgeon: Evans Lance, MD;  Location: [place] [facility];  Service: Cardiovascular;  Laterality: N/A;  ? LEFT HEART CATH AND CORS/GRAFTS ANGIOGRAPHY N/A 12/29/2017  ? Procedure: LEFT HEART CATH AND CORS/GRAFTS ANGIOGRAPHY;  Surgeon: Martinique, Peter M, MD;  Location: [place] [facility];  Service: Cardiovascular;  Laterality: N/A;  ? ? ?Current Outpatient Medications  ?Medication Sig Dispense Refill  ? albuterol (ACCUNEB) 0.63 MG/3ML nebulizer solution Take 3 mLs by nebulization 4 (four) times daily as needed for shortness of breath or wheezing. 3 ml inhale orally via nebulizer four times a day    ? albuterol  (PROVENTIL HFA;VENTOLIN HFA) 108 (90 Base) MCG/ACT inhaler Inhale 2 puffs into the lungs every 6 (six) hours as needed for wheezing or shortness of breath.    ? allopurinol (ZYLOPRIM) 100 MG tablet Take 200 mg by mouth daily.    ? aspirin EC 81 MG tablet Take 1 tablet (81 mg total) by mouth daily. Swallow whole. 90 tablet 3  ? atorvastatin (LIPITOR) 40 MG tablet TAKE 1 TABLET EVERY DAY 90 tablet 2  ? cetirizine (ZYRTEC) 10 MG tablet Take 10 mg by mouth daily.    ? cholecalciferol (VITAMIN D) 1000 units tablet Take 1,000 Units by mouth daily.    ? Cyanocobalamin (VITAMIN B 12 PO) Take 1 tablet by mouth daily.    ? ELIQUIS 5 MG TABS tablet TAKE 1 TABLET TWICE DAILY 180 tablet 1  ? ferrous sulfate 325 (65 FE) MG tablet Take 325 mg by mouth daily with breakfast.    ? folic acid (FOLVITE) 275 MCG tablet Take 400 mcg by mouth daily.    ? Multiple Vitamin (MULTIVITAMIN) tablet Take 1 tablet by mouth daily.    ? nitroGLYCERIN (NITROSTAT) 0.4 MG SL tablet PLACE 1 TABLET (0.4 MG TOTAL) UNDER THE TONGUE EVERY 5 (FIVE) MINUTES AS NEEDED FOR CHEST PAIN. 75 tablet 2  ? torsemide (DEMADEX) 20 MG tablet Take 2 tablets (40 mg total) by mouth 2 (two) times daily.    ? TRELEGY ELLIPTA 100-62.5-25 MCG/ACT AEPB Inhale 1 puff into the lungs daily.    ? carvedilol (COREG) 25 MG tablet Take 50mg  in the AM and 37.5mg  in the PM 105 tablet 2  ? ?No current facility-administered medications for this encounter.  ? ? ?Allergies  ?Allergen Reactions  ? Ipratropium Other (See Comments)  ? Metoprolol   ?  Was bringing blood pressure too low and he would pass out.  ? Morphine And Related Nausea Only  ? Vicodin [Hydrocodone-Acetaminophen] Other (See Comments)  ?  Hallucinations ?  ? Penicillins Rash  ?  Did it involve swelling of the face/tongue/throat, SOB, or low BP? No ?Did it involve sudden or severe rash/hives, skin peeling, or any reaction on the inside of your mouth or nose? Yes ?Did you need to seek medical attention at a hospital or doctor's  office? No ?When did it last happen? Over 7 Years Ago      ?If all above answers are "NO", may proceed with cephalosporin use. ? ?  ? ? ?Social History  ? ?Socioeconomic History  ? Marital status: Married  ?  Spouse name: Justyce Baby  ? Number of children: 2  ? Years of education: Not on file  ? Highest education level: 11th grade  ?Occupational History  ? 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 Smoking status: Former  ?  Types: Cigarettes  ?  Quit date: 11/30/2012  ?  Years since quitting: 9.3  ? Smokeless tobacco: Never  ?Vaping Use  ? Vaping Use: Never used  ?Substance and Sexual Activity  ? Alcohol use: No  ?  Comment: last drink 10 + years  ? Drug use: No  ? Sexual activity: Not on file  ?Other Topics Concern  ? Not on file  ?Social History Narrative  ? Not on file  ? ?Social Determinants of Health  ? ?Financial Resource Strain: Low Risk   ? Difficulty of Paying Living Expenses: Not hard at all  ?Food Insecurity: No Food Insecurity  ? Worried About Charity fundraiser in the Last Year: Never true  ? Ran Out of Food in the Last Year: Never true  ?Transportation Needs: No Transportation Needs  ? Lack of Transportation (Medical): No  ? Lack of Transportation (Non-Medical): No  ?Physical Activity: Not on file  ?Stress: Not on file  ?Social Connections: Not on file  ?Intimate Partner Violence: Not on file  ? ? ?Family History  ?Problem Relation Age of Onset  ? Hypertension Mother   ? Heart disease Mother   ? Hypertension Sister   ? Heart disease Sister   ? Thyroid disease Sister   ? Thyroid disease Brother   ?     hyperthyroid  ? ? ?ROS- All systems are reviewed and negative except as per the HPI above ? ?Physical Exam: ?Vitals:  ? 04/13/22 1009  ?BP: (!) 158/64  ?Pulse: 66  ?Weight: 85.2 kg  ?Height: 5\' 9"  (1.753 m)  ? ?Wt Readings from Last 3 Encounters:  ?04/13/22 85.2 kg  ?03/30/22 84.1 kg  ?03/23/22 83 kg  ? ? ?Labs: ?Lab Results  ?Component Value Date  ? NA 137 03/22/2022  ? K 3.8  03/22/2022  ? CL 105 03/22/2022  ? CO2 23 03/22/2022  ? GLUCOSE 126 (H) 03/22/2022  ? BUN 24 (H) 03/22/2022  ? CREATININE 2.81 (H) 03/22/2022  ? CALCIUM 9.8 03/22/2022  ? PHOS 3.6 01/01/2018  ? MG 2.1 03/28/202

## 2022-04-14 ENCOUNTER — Ambulatory Visit (HOSPITAL_COMMUNITY): Payer: Medicare HMO

## 2022-04-15 ENCOUNTER — Ambulatory Visit (INDEPENDENT_AMBULATORY_CARE_PROVIDER_SITE_OTHER): Payer: Medicare HMO

## 2022-04-15 ENCOUNTER — Other Ambulatory Visit: Payer: Self-pay

## 2022-04-15 ENCOUNTER — Emergency Department (HOSPITAL_COMMUNITY): Payer: Medicare HMO

## 2022-04-15 ENCOUNTER — Inpatient Hospital Stay (HOSPITAL_COMMUNITY)
Admission: EM | Admit: 2022-04-15 | Discharge: 2022-04-19 | DRG: 291 | Disposition: A | Discharge status: Home / Self Care | Payer: Medicare HMO | Attending: Internal Medicine | Admitting: Internal Medicine

## 2022-04-15 ENCOUNTER — Encounter (HOSPITAL_COMMUNITY): Payer: Self-pay | Admitting: Family Medicine

## 2022-04-15 DIAGNOSIS — I161 Hypertensive emergency: Secondary | ICD-10-CM | POA: Diagnosis present

## 2022-04-15 DIAGNOSIS — J9601 Acute respiratory failure with hypoxia: Secondary | ICD-10-CM | POA: Diagnosis present

## 2022-04-15 DIAGNOSIS — Z7901 Long term (current) use of anticoagulants: Secondary | ICD-10-CM | POA: Diagnosis not present

## 2022-04-15 DIAGNOSIS — G4733 Obstructive sleep apnea (adult) (pediatric): Secondary | ICD-10-CM | POA: Diagnosis present

## 2022-04-15 DIAGNOSIS — Z8673 Personal history of transient ischemic attack (TIA), and cerebral infarction without residual deficits: Secondary | ICD-10-CM | POA: Diagnosis not present

## 2022-04-15 DIAGNOSIS — J449 Chronic obstructive pulmonary disease, unspecified: Secondary | ICD-10-CM | POA: Diagnosis present

## 2022-04-15 DIAGNOSIS — J9602 Acute respiratory failure with hypercapnia: Secondary | ICD-10-CM | POA: Diagnosis present

## 2022-04-15 DIAGNOSIS — I255 Ischemic cardiomyopathy: Secondary | ICD-10-CM | POA: Diagnosis present

## 2022-04-15 DIAGNOSIS — I5043 Acute on chronic combined systolic (congestive) and diastolic (congestive) heart failure: Secondary | ICD-10-CM | POA: Diagnosis present

## 2022-04-15 DIAGNOSIS — N184 Chronic kidney disease, stage 4 (severe): Secondary | ICD-10-CM | POA: Diagnosis present

## 2022-04-15 DIAGNOSIS — J810 Acute pulmonary edema: Secondary | ICD-10-CM

## 2022-04-15 DIAGNOSIS — I495 Sick sinus syndrome: Secondary | ICD-10-CM | POA: Diagnosis present

## 2022-04-15 DIAGNOSIS — E8729 Other acidosis: Secondary | ICD-10-CM | POA: Diagnosis present

## 2022-04-15 DIAGNOSIS — Z20822 Contact with and (suspected) exposure to covid-19: Secondary | ICD-10-CM | POA: Diagnosis present

## 2022-04-15 DIAGNOSIS — Z7982 Long term (current) use of aspirin: Secondary | ICD-10-CM | POA: Diagnosis not present

## 2022-04-15 DIAGNOSIS — F0150 Vascular dementia without behavioral disturbance: Secondary | ICD-10-CM | POA: Diagnosis present

## 2022-04-15 DIAGNOSIS — I252 Old myocardial infarction: Secondary | ICD-10-CM

## 2022-04-15 DIAGNOSIS — D509 Iron deficiency anemia, unspecified: Secondary | ICD-10-CM | POA: Diagnosis present

## 2022-04-15 DIAGNOSIS — I480 Paroxysmal atrial fibrillation: Secondary | ICD-10-CM | POA: Diagnosis present

## 2022-04-15 DIAGNOSIS — I447 Left bundle-branch block, unspecified: Secondary | ICD-10-CM | POA: Diagnosis present

## 2022-04-15 DIAGNOSIS — J42 Unspecified chronic bronchitis: Secondary | ICD-10-CM

## 2022-04-15 DIAGNOSIS — I169 Hypertensive crisis, unspecified: Secondary | ICD-10-CM | POA: Diagnosis not present

## 2022-04-15 DIAGNOSIS — I2510 Atherosclerotic heart disease of native coronary artery without angina pectoris: Secondary | ICD-10-CM | POA: Diagnosis present

## 2022-04-15 DIAGNOSIS — K219 Gastro-esophageal reflux disease without esophagitis: Secondary | ICD-10-CM | POA: Diagnosis present

## 2022-04-15 DIAGNOSIS — E785 Hyperlipidemia, unspecified: Secondary | ICD-10-CM | POA: Diagnosis present

## 2022-04-15 DIAGNOSIS — Z7951 Long term (current) use of inhaled steroids: Secondary | ICD-10-CM | POA: Diagnosis not present

## 2022-04-15 DIAGNOSIS — Z87891 Personal history of nicotine dependence: Secondary | ICD-10-CM

## 2022-04-15 DIAGNOSIS — Z8349 Family history of other endocrine, nutritional and metabolic diseases: Secondary | ICD-10-CM

## 2022-04-15 DIAGNOSIS — Z95810 Presence of automatic (implantable) cardiac defibrillator: Secondary | ICD-10-CM | POA: Diagnosis not present

## 2022-04-15 DIAGNOSIS — Z8249 Family history of ischemic heart disease and other diseases of the circulatory system: Secondary | ICD-10-CM

## 2022-04-15 DIAGNOSIS — Z951 Presence of aortocoronary bypass graft: Secondary | ICD-10-CM

## 2022-04-15 DIAGNOSIS — I951 Orthostatic hypotension: Secondary | ICD-10-CM | POA: Diagnosis not present

## 2022-04-15 DIAGNOSIS — Z79899 Other long term (current) drug therapy: Secondary | ICD-10-CM

## 2022-04-15 DIAGNOSIS — R0602 Shortness of breath: Secondary | ICD-10-CM | POA: Diagnosis present

## 2022-04-15 DIAGNOSIS — Z955 Presence of coronary angioplasty implant and graft: Secondary | ICD-10-CM | POA: Diagnosis not present

## 2022-04-15 DIAGNOSIS — Z91119 Patient's noncompliance with dietary regimen due to unspecified reason: Secondary | ICD-10-CM

## 2022-04-15 DIAGNOSIS — I130 Hypertensive heart and chronic kidney disease with heart failure and stage 1 through stage 4 chronic kidney disease, or unspecified chronic kidney disease: Principal | ICD-10-CM | POA: Diagnosis present

## 2022-04-15 LAB — TROPONIN I (HIGH SENSITIVITY)
Troponin I (High Sensitivity): 38 ng/L — ABNORMAL HIGH (ref <18)
Troponin I (High Sensitivity): 116 ng/L (ref <18)

## 2022-04-15 LAB — CBC WITH DIFFERENTIAL/PLATELET
Abs Immature Granulocytes: 0.28 10*3/uL — ABNORMAL HIGH (ref 0.00–0.07)
Basophils Absolute: 0.2 10*3/uL — ABNORMAL HIGH (ref 0.0–0.1)
Basophils Relative: 2 %
Eosinophils Absolute: 0.5 10*3/uL (ref 0.0–0.5)
Eosinophils Relative: 4 %
HCT: 39.3 % (ref 39.0–52.0)
Hemoglobin: 12.3 g/dL — ABNORMAL LOW (ref 13.0–17.0)
Immature Granulocytes: 2 %
Lymphocytes Relative: 18 %
Lymphs Abs: 2.4 10*3/uL (ref 0.7–4.0)
MCH: 30.8 pg (ref 26.0–34.0)
MCHC: 31.3 g/dL (ref 30.0–36.0)
MCV: 98.5 fL (ref 80.0–100.0)
Monocytes Absolute: 1 10*3/uL (ref 0.1–1.0)
Monocytes Relative: 8 %
Neutro Abs: 9.4 10*3/uL — ABNORMAL HIGH (ref 1.7–7.7)
Neutrophils Relative %: 66 %
Platelets: 405 10*3/uL — ABNORMAL HIGH (ref 150–400)
RBC: 3.99 MIL/uL — ABNORMAL LOW (ref 4.22–5.81)
RDW: 15.4 % (ref 11.5–15.5)
WBC: 13.9 10*3/uL — ABNORMAL HIGH (ref 4.0–10.5)
nRBC: 0 % (ref 0.0–0.2)

## 2022-04-15 LAB — COMPREHENSIVE METABOLIC PANEL
ALT: 39 U/L (ref 0–44)
AST: 50 U/L — ABNORMAL HIGH (ref 15–41)
Albumin: 3.8 g/dL (ref 3.5–5.0)
Alkaline Phosphatase: 153 U/L — ABNORMAL HIGH (ref 38–126)
Anion gap: 10 (ref 5–15)
BUN: 21 mg/dL (ref 8–23)
CO2: 21 mmol/L — ABNORMAL LOW (ref 22–32)
Calcium: 9.2 mg/dL (ref 8.9–10.3)
Chloride: 105 mmol/L (ref 98–111)
Creatinine, Ser: 2.58 mg/dL — ABNORMAL HIGH (ref 0.61–1.24)
GFR, Estimated: 25 mL/min — ABNORMAL LOW (ref >60)
Glucose, Bld: 189 mg/dL — ABNORMAL HIGH (ref 70–99)
Potassium: 3.9 mmol/L (ref 3.5–5.1)
Sodium: 136 mmol/L (ref 135–145)
Total Bilirubin: 0.7 mg/dL (ref 0.3–1.2)
Total Protein: 7.2 g/dL (ref 6.5–8.1)

## 2022-04-15 LAB — CUP PACEART REMOTE DEVICE CHECK
Battery Remaining Longevity: 42 mo
Battery Remaining Percentage: 48 %
Battery Voltage: 2.92 V
Brady Statistic AP VP Percent: 1 %
Brady Statistic AP VS Percent: 26 %
Brady Statistic AS VP Percent: 1 %
Brady Statistic AS VS Percent: 72 %
Brady Statistic RA Percent Paced: 22 %
Brady Statistic RV Percent Paced: 1 %
Date Time Interrogation Session: 20230421040032
HighPow Impedance: 69 Ohm
HighPow Impedance: 69 Ohm
Implantable Lead Implant Date: 20140217
Implantable Lead Implant Date: 20190104
Implantable Lead Location: 753859
Implantable Lead Location: 753860
Implantable Lead Model: 7122
Implantable Pulse Generator Implant Date: 20190104
Lead Channel Impedance Value: 300 Ohm
Lead Channel Impedance Value: 560 Ohm
Lead Channel Pacing Threshold Amplitude: 1 V
Lead Channel Pacing Threshold Amplitude: 1.25 V
Lead Channel Pacing Threshold Pulse Width: 0.5 ms
Lead Channel Pacing Threshold Pulse Width: 0.8 ms
Lead Channel Sensing Intrinsic Amplitude: 0.7 mV
Lead Channel Sensing Intrinsic Amplitude: 12 mV
Lead Channel Setting Pacing Amplitude: 2.5 V
Lead Channel Setting Pacing Amplitude: 2.5 V
Lead Channel Setting Pacing Pulse Width: 0.5 ms
Lead Channel Setting Sensing Sensitivity: 0.5 mV
Pulse Gen Serial Number: 9777027

## 2022-04-15 LAB — HEPATIC FUNCTION PANEL
ALT: 37 U/L (ref 0–44)
AST: 40 U/L (ref 15–41)
Albumin: 3.7 g/dL (ref 3.5–5.0)
Alkaline Phosphatase: 139 U/L — ABNORMAL HIGH (ref 38–126)
Bilirubin, Direct: 0.2 mg/dL (ref 0.0–0.2)
Indirect Bilirubin: 0.8 mg/dL (ref 0.3–0.9)
Total Bilirubin: 1 mg/dL (ref 0.3–1.2)
Total Protein: 6.8 g/dL (ref 6.5–8.1)

## 2022-04-15 LAB — RESP PANEL BY RT-PCR (FLU A&B, COVID) ARPGX2
Influenza A by PCR: NEGATIVE
Influenza B by PCR: NEGATIVE
SARS Coronavirus 2 by RT PCR: NEGATIVE

## 2022-04-15 LAB — I-STAT ARTERIAL BLOOD GAS, ED
Acid-base deficit: 2 mmol/L (ref 0.0–2.0)
Bicarbonate: 25.8 mmol/L (ref 20.0–28.0)
Calcium, Ion: 1.24 mmol/L (ref 1.15–1.40)
HCT: 37 % — ABNORMAL LOW (ref 39.0–52.0)
Hemoglobin: 12.6 g/dL — ABNORMAL LOW (ref 13.0–17.0)
O2 Saturation: 86 %
Potassium: 3.9 mmol/L (ref 3.5–5.1)
Sodium: 136 mmol/L (ref 135–145)
TCO2: 27 mmol/L (ref 22–32)
pCO2 arterial: 56.3 mmHg — ABNORMAL HIGH (ref 32–48)
pH, Arterial: 7.268 — ABNORMAL LOW (ref 7.35–7.45)
pO2, Arterial: 60 mmHg — ABNORMAL LOW (ref 83–108)

## 2022-04-15 LAB — BASIC METABOLIC PANEL
Anion gap: 9 (ref 5–15)
BUN: 22 mg/dL (ref 8–23)
CO2: 23 mmol/L (ref 22–32)
Calcium: 9.4 mg/dL (ref 8.9–10.3)
Chloride: 105 mmol/L (ref 98–111)
Creatinine, Ser: 2.51 mg/dL — ABNORMAL HIGH (ref 0.61–1.24)
GFR, Estimated: 26 mL/min — ABNORMAL LOW (ref >60)
Glucose, Bld: 168 mg/dL — ABNORMAL HIGH (ref 70–99)
Potassium: 3.9 mmol/L (ref 3.5–5.1)
Sodium: 137 mmol/L (ref 135–145)

## 2022-04-15 LAB — CBC
HCT: 32.6 % — ABNORMAL LOW (ref 39.0–52.0)
Hemoglobin: 10.9 g/dL — ABNORMAL LOW (ref 13.0–17.0)
MCH: 32 pg (ref 26.0–34.0)
MCHC: 33.4 g/dL (ref 30.0–36.0)
MCV: 95.6 fL (ref 80.0–100.0)
Platelets: 286 10*3/uL (ref 150–400)
RBC: 3.41 MIL/uL — ABNORMAL LOW (ref 4.22–5.81)
RDW: 15.5 % (ref 11.5–15.5)
WBC: 14.6 10*3/uL — ABNORMAL HIGH (ref 4.0–10.5)
nRBC: 0 % (ref 0.0–0.2)

## 2022-04-15 LAB — BRAIN NATRIURETIC PEPTIDE: B Natriuretic Peptide: 1181.1 pg/mL — ABNORMAL HIGH (ref 0.0–100.0)

## 2022-04-15 LAB — MAGNESIUM: Magnesium: 2.5 mg/dL — ABNORMAL HIGH (ref 1.7–2.4)

## 2022-04-15 MED ORDER — ATORVASTATIN CALCIUM 40 MG PO TABS
40.0000 mg | ORAL_TABLET | Freq: Every day | ORAL | Status: DC
  Administered 2022-04-16 – 2022-04-19 (×4): 40 mg via ORAL
  Filled 2022-04-15 (×4): qty 1

## 2022-04-15 MED ORDER — FUROSEMIDE 10 MG/ML IJ SOLN
40.0000 mg | Freq: Once | INTRAMUSCULAR | Status: AC
  Administered 2022-04-15: 40 mg via INTRAVENOUS
  Filled 2022-04-15: qty 4

## 2022-04-15 MED ORDER — CARVEDILOL 12.5 MG PO TABS: 25.0000 mg | ORAL_TABLET | ORAL | Status: DC

## 2022-04-15 MED ORDER — CARVEDILOL 25 MG PO TABS
50.0000 mg | ORAL_TABLET | Freq: Every morning | ORAL | Status: DC
Start: 2022-04-16 — End: 2022-04-19
  Administered 2022-04-16 – 2022-04-19 (×4): 50 mg via ORAL
  Filled 2022-04-15 (×4): qty 2

## 2022-04-15 MED ORDER — ALBUTEROL SULFATE (2.5 MG/3ML) 0.083% IN NEBU: 3.0000 mL | INHALATION_SOLUTION | Freq: Four times a day (QID) | RESPIRATORY_TRACT | Status: DC | PRN

## 2022-04-15 MED ORDER — SODIUM CHLORIDE 0.9% FLUSH
3.0000 mL | Freq: Two times a day (BID) | INTRAVENOUS | Status: DC
  Administered 2022-04-15 – 2022-04-19 (×8): 3 mL via INTRAVENOUS

## 2022-04-15 MED ORDER — FLUTICASONE FUROATE-VILANTEROL 100-25 MCG/ACT IN AEPB
1.0000 | INHALATION_SPRAY | Freq: Every day | RESPIRATORY_TRACT | Status: DC
  Administered 2022-04-16 – 2022-04-19 (×4): 1 via RESPIRATORY_TRACT
  Filled 2022-04-15: qty 28

## 2022-04-15 MED ORDER — ACETAMINOPHEN 325 MG PO TABS
650.0000 mg | ORAL_TABLET | Freq: Four times a day (QID) | ORAL | Status: DC | PRN
  Administered 2022-04-16: 650 mg via ORAL
  Filled 2022-04-15: qty 2

## 2022-04-15 MED ORDER — APIXABAN 5 MG PO TABS
5.0000 mg | ORAL_TABLET | Freq: Two times a day (BID) | ORAL | Status: DC
  Administered 2022-04-16 – 2022-04-19 (×7): 5 mg via ORAL
  Filled 2022-04-15 (×7): qty 1

## 2022-04-15 MED ORDER — ACETAMINOPHEN 650 MG RE SUPP: 650.0000 mg | Freq: Four times a day (QID) | RECTAL | Status: DC | PRN

## 2022-04-15 MED ORDER — SENNOSIDES-DOCUSATE SODIUM 8.6-50 MG PO TABS: 1.0000 | ORAL_TABLET | Freq: Every evening | ORAL | Status: DC | PRN

## 2022-04-15 MED ORDER — LABETALOL HCL 5 MG/ML IV SOLN
10.0000 mg | INTRAVENOUS | Status: DC | PRN
  Filled 2022-04-15: qty 4

## 2022-04-15 MED ORDER — FUROSEMIDE 10 MG/ML IJ SOLN
40.0000 mg | Freq: Two times a day (BID) | INTRAMUSCULAR | Status: DC
  Administered 2022-04-16 – 2022-04-17 (×3): 40 mg via INTRAVENOUS
  Filled 2022-04-15 (×3): qty 4

## 2022-04-15 MED ORDER — CARVEDILOL 25 MG PO TABS
37.5000 mg | ORAL_TABLET | Freq: Every evening | ORAL | Status: DC
  Administered 2022-04-15 – 2022-04-18 (×4): 37.5 mg via ORAL
  Filled 2022-04-15 (×4): qty 1

## 2022-04-15 MED ORDER — NITROGLYCERIN IN D5W 200-5 MCG/ML-% IV SOLN
0.0000 ug/min | INTRAVENOUS | Status: DC
  Administered 2022-04-15: 200 ug/min via INTRAVENOUS
  Filled 2022-04-15: qty 250

## 2022-04-15 MED ORDER — UMECLIDINIUM BROMIDE 62.5 MCG/ACT IN AEPB
1.0000 | INHALATION_SPRAY | Freq: Every day | RESPIRATORY_TRACT | Status: DC
  Administered 2022-04-16 – 2022-04-19 (×4): 1 via RESPIRATORY_TRACT
  Filled 2022-04-15: qty 7

## 2022-04-15 NOTE — ED Notes (Signed)
ED TO INPATIENT HANDOFF REPORT ? ?ED Nurse Name and Phone #: Deneise Lever [phone] ? ?S ?Name/Age/Gender ?Joe Wiggins ?76 y.o. ?male ?Room/Bed: RESUSC/RESUSC ? ?Code Status ?  Code Status: Full Code ? ?Home/SNF/Other ?Home ?Patient oriented to: self, place, time, and situation ?Is this baseline? Yes  ? ?Triage Complete: Triage complete  ?Chief Complaint ?Acute on chronic combined systolic and diastolic CHF (congestive heart failure) ([place]) [I50.43] ? ?Triage Note ?Pt via [facility] from home, EMS originally called out for CP. On arrival, EMS noted retractions, use of accessory muscles. O2 sats WNL, but increased work of breathing.  ?Hx COPD, asthma ?2x duoneb, 2g mag, 125 solumedrol, 2 NTG PTA EMS ?Appeared afib RVR via monitor, per EMS pt converted prior to getting into truck, CP resolved.  ? ?20g L forearm  ? ?Allergies ?Allergies  ?Allergen Reactions  ? Ipratropium Other (See Comments)  ? Metoprolol   ?  Was bringing blood pressure too low and he would pass out.  ? Morphine And Related Nausea Only  ? Vicodin [Hydrocodone-Acetaminophen] Other (See Comments)  ?  Hallucinations ?  ? Penicillins Rash  ?  Did it involve swelling of the face/tongue/throat, SOB, or low BP? No ?Did it involve sudden or severe rash/hives, skin peeling, or any reaction on the inside of your mouth or nose? Yes ?Did you need to seek medical attention at a hospital or doctor's office? No ?When did it last happen? Over 7 Years Ago      ?If all above answers are "NO", may proceed with cephalosporin use. ? ?  ? ? ?Level of Care/Admitting Diagnosis ?ED Disposition   ? ? ED Disposition  ?Admit  ? Condition  ?--  ? Comment  ?Hospital Area: [facility] [[id number]] ? Level of Care: Progressive [102] ? Admit to Progressive based on following criteria: RESPIRATORY PROBLEMS hypoxemic/hypercapnic respiratory failure that is responsive to NIPPV (BiPAP) or High Flow Nasal Cannula (6-80 lpm). Frequent assessment/intervention, no > Q2 hrs < Q4 hrs, to  maintain oxygenation and pulmonary hygiene. ? May admit patient to [facility] or [facility] if equivalent level of care is available:: Yes ? Covid Evaluation: Asymptomatic - no recent exposure (last 10 days) testing not required ? Diagnosis: Acute on chronic combined systolic and diastolic CHF (congestive heart failure) ([place]) [465035] ? Admitting Physician: Vianne Bulls [[id number]] ? Attending Physician: Vianne Bulls [[id number]] ? Estimated length of stay: past midnight tomorrow ? Certification:: I certify this patient will need inpatient services for at least 2 midnights ?  ?  ? ?  ? ? ?B ?Medical/Surgery History ?Past Medical History:  ?Diagnosis Date  ? Abrasion of forearm, right 07/27/2019  ? Acute respiratory failure ([place])   ? AF (paroxysmal atrial fibrillation) ([place])   ? AICD (automatic cardioverter/defibrillator) present   ? CAD (coronary artery disease)   ? Cardiomyopathy, ischemic   ? Carotid artery occlusion   ? Chronic kidney disease   ? COPD (chronic obstructive pulmonary disease) ([place])   ? Cough 11/13/2019  ? Dementia without behavioral disturbance ([place])   ? Dizziness 01/17/2016  ? Facial rash 08/15/2018  ? GERD (gastroesophageal reflux disease)   ? Hyperlipidemia   ? Hypertension   ? Pre-syncope 01/16/2018  ? Respiratory distress 12/27/2020  ? Sleep apnea   ? Stroke [facility])   ? TIA (transient ischemic attack)   ? ?Past Surgical History:  ?Procedure Laterality Date  ? CARDIAC PACEMAKER PLACEMENT    ? CATARACT EXTRACTION    ? CORONARY ARTERY  BYPASS GRAFT    ? CORONARY STENT PLACEMENT    ? EYE SURGERY    ? ICD IMPLANT N/A 12/29/2017  ? Procedure: ICD IMPLANT;  Surgeon: Evans Lance, MD;  Location: [facility];  Service: Cardiovascular;  Laterality: N/A;  ? LEFT HEART CATH AND CORS/GRAFTS ANGIOGRAPHY N/A 12/29/2017  ? Procedure: LEFT HEART CATH AND CORS/GRAFTS ANGIOGRAPHY;  Surgeon: Martinique, Peter M, MD;  Location: [place] [facility];  Service: Cardiovascular;  Laterality: N/A;  ?  ? ?A ?IV  Location/Drains/Wounds ?Patient Lines/Drains/Airways Status   ? ? Active Line/Drains/Airways   ? ? Name Placement date Placement time Site Days  ? Peripheral IV 04/15/22 18 G Right Antecubital 04/15/22  1832  Antecubital  less than 1  ? Peripheral IV 04/15/22 20 G Left Forearm 04/15/22  --  Forearm  less than 1  ? ?  ?  ? ?  ? ? ?Intake/Output Last 24 hours ?No intake or output data in the 24 hours ending 04/15/22 2123 ? ?Labs/Imaging ?Results for orders placed or performed during the hospital encounter of 04/15/22 (from the past 48 hour(s))  ?Comprehensive metabolic panel     Status: Abnormal  ? Collection Time: 04/15/22  6:40 PM  ?Result Value Ref Range  ? Sodium 136 135 - 145 mmol/L  ? Potassium 3.9 3.5 - 5.1 mmol/L  ? Chloride 105 98 - 111 mmol/L  ? CO2 21 (L) 22 - 32 mmol/L  ? Glucose, Bld 189 (H) 70 - 99 mg/dL  ?  Comment: Glucose reference range applies only to samples taken after fasting for at least 8 hours.  ? BUN 21 8 - 23 mg/dL  ? Creatinine, Ser 2.58 (H) 0.61 - 1.24 mg/dL  ? Calcium 9.2 8.9 - 10.3 mg/dL  ? Total Protein 7.2 6.5 - 8.1 g/dL  ? Albumin 3.8 3.5 - 5.0 g/dL  ? AST 50 (H) 15 - 41 U/L  ? ALT 39 0 - 44 U/L  ? Alkaline Phosphatase 153 (H) 38 - 126 U/L  ? Total Bilirubin 0.7 0.3 - 1.2 mg/dL  ? GFR, Estimated 25 (L) >60 mL/min  ?  Comment: (NOTE) ?Calculated using the CKD-EPI Creatinine Equation (2021) ?  ? Anion gap 10 5 - 15  ?  Comment: Performed at [facility], [place] [address] [place], [place] [address]  ?Troponin I (High Sensitivity)     Status: Abnormal  ? Collection Time: 04/15/22  6:40 PM  ?Result Value Ref Range  ? Troponin I (High Sensitivity) 38 (H) <18 ng/L  ?  Comment: (NOTE) ?Elevated high sensitivity troponin I (hsTnI) values and significant  ?changes across serial measurements may suggest ACS but many other  ?chronic and acute conditions are known to elevate hsTnI results.  ?Refer to the "Links" section for chest pain algorithms and additional  ?guidance. ?Performed at [facility], [place] [address], [place], [place] ?[address] ?  ?CBC with Differential     Status: Abnormal  ? Collection Time: 04/15/22  6:40 PM  ?Result Value Ref Range  ? WBC 13.9 (H) 4.0 - 10.5 K/uL  ? RBC 3.99 (L) 4.22 - 5.81 MIL/uL  ? Hemoglobin 12.3 (L) 13.0 - 17.0 g/dL  ? HCT 39.3 39.0 - 52.0 %  ? MCV 98.5 80.0 - 100.0 fL  ? MCH 30.8 26.0 - 34.0 pg  ? MCHC 31.3 30.0 - 36.0 g/dL  ? RDW 15.4 11.5 - 15.5 %  ? Platelets 405 (H) 150 - 400  K/uL  ? nRBC 0.0 0.0 - 0.2 %  ? Neutrophils Relative % 66 %  ? Neutro Abs 9.4 (H) 1.7 - 7.7 K/uL  ? Lymphocytes Relative 18 %  ? Lymphs Abs 2.4 0.7 - 4.0 K/uL  ? Monocytes Relative 8 %  ? Monocytes Absolute 1.0 0.1 - 1.0 K/uL  ? Eosinophils Relative 4 %  ? Eosinophils Absolute 0.5 0.0 - 0.5 K/uL  ? Basophils Relative 2 %  ? Basophils Absolute 0.2 (H) 0.0 - 0.1 K/uL  ? Immature Granulocytes 2 %  ? Abs Immature Granulocytes 0.28 (H) 0.00 - 0.07 K/uL  ?  Comment: Performed at [facility], [place] [address] [place], [place] [address]  ?I-Stat arterial blood gas, ED     Status: Abnormal  ? Collection Time: 04/15/22  6:43 PM  ?Result Value Ref Range  ? pH, Arterial 7.268 (L) 7.35 - 7.45  ? pCO2 arterial 56.3 (H) 32 - 48 mmHg  ? pO2, Arterial 60 (L) 83 - 108 mmHg  ? Bicarbonate 25.8 20.0 - 28.0 mmol/L  ? TCO2 27 22 - 32 mmol/L  ? O2 Saturation 86 %  ? Acid-base deficit 2.0 0.0 - 2.0 mmol/L  ? Sodium 136 135 - 145 mmol/L  ? Potassium 3.9 3.5 - 5.1 mmol/L  ? Calcium, Ion 1.24 1.15 - 1.40 mmol/L  ? HCT 37.0 (L) 39.0 - 52.0 %  ? Hemoglobin 12.6 (L) 13.0 - 17.0 g/dL  ? Collection site RADIAL, ALLEN'S TEST ACCEPTABLE   ? Drawn by RT   ? Sample type ARTERIAL   ?Troponin I (High Sensitivity)     Status: Abnormal  ? Collection Time: 04/15/22  8:39 PM  ?Result Value Ref Range  ? Troponin I (High Sensitivity) 116 (HH) <18 ng/L  ?  Comment: CRITICAL RESULT CALLED TO, READ BACK BY AND VERIFIED WITH: ?A. Rolena Infante, RN 2122 04/15/22 A GASKINS ?(NOTE) ?Elevated high sensitivity troponin I (hsTnI) values  and significant  ?changes across serial measurements may suggest ACS but many other  ?chronic and acute conditions are known to elevate hsTnI results.  ?Refer to the Links section for chest pain algorithms and additional

## 2022-04-15 NOTE — ED Triage Notes (Signed)
Pt via [facility] from home, EMS originally called out for CP. On arrival, EMS noted retractions, use of accessory muscles. O2 sats WNL, but increased work of breathing.  ?Hx COPD, asthma ?2x duoneb, 2g mag, 125 solumedrol, 2 NTG PTA EMS ?Appeared afib RVR via monitor, per EMS pt converted prior to getting into truck, CP resolved.  ? ?20g L forearm ?

## 2022-04-15 NOTE — H&P (Signed)
?History and Physical  ? ? ?Joe Wiggins OAC:[id number] DOB: [birth year] DOA: 04/15/2022 ? ?PCP: Loura Pardon, MD  ? ?Patient coming from: Home  ? ?Chief Complaint: SOB  ? ?HPI: Joe Wiggins is a pleasant 76 y.o. male with medical history significant for coronary artery disease with NSTEMI last month, paroxysmal atrial fibrillation, COPD, CKD stage IV, vascular dementia, and chronic combined systolic and diastolic CHF, now presenting to the emergency department with acute respiratory distress.  Patient is accompanied by his wife who assists with the history.  He had been experiencing progressive dyspnea over the past several days, had gained a few pounds despite adherence with his medications, and then worsened acutely this evening.  His wife notes that he has trouble avoiding salty foods and likes to drink several sodas and glasses of tea every day.  He went out to eat today, had several cans of Pepsi, and became acutely dyspneic, prompting EMS to be called.  He appeared to be in rapid atrial fibrillation with rate 170 per EMS, but converted to SR prior to arrival in the ED.  He denies any recent cough, fever, or chills.  He reports frequent leg swelling but has not noticed if it was worse recently. ? ?ED Course: Upon arrival to the ED, patient is found to be in acute respiratory distress, hypertensive to 210/124, and mildly tachycardic.  Chest x-ray notable for bilateral airspace opacities, CBC notable for leukocytosis, chemistry panel with stable CKD 4, and ABG notable for respiratory acidosis and hypoxemia.  Troponin was slightly elevated.  Patient was started on BiPAP and nitroglycerin infusion, and he was given 2 doses of 40 mg IV Lasix.  He made dramatic improvement in the ED and was transitioned to nasal cannula and taken off the nitroglycerin drip. ? ?Review of Systems:  ?All other systems reviewed and apart from HPI, are negative. ? ?Past Medical History:  ?Diagnosis Date  ? Abrasion of forearm, right  07/27/2019  ? Acute respiratory failure ([place])   ? AF (paroxysmal atrial fibrillation) ([place])   ? AICD (automatic cardioverter/defibrillator) present   ? CAD (coronary artery disease)   ? Cardiomyopathy, ischemic   ? Carotid artery occlusion   ? Chronic kidney disease   ? COPD (chronic obstructive pulmonary disease) ([place])   ? Cough 11/13/2019  ? Dementia without behavioral disturbance ([place])   ? Dizziness 01/17/2016  ? Facial rash 08/15/2018  ? GERD (gastroesophageal reflux disease)   ? Hyperlipidemia   ? Hypertension   ? Pre-syncope 01/16/2018  ? Respiratory distress 12/27/2020  ? Sleep apnea   ? Stroke [facility])   ? TIA (transient ischemic attack)   ? ? ?Past Surgical History:  ?Procedure Laterality Date  ? CARDIAC PACEMAKER PLACEMENT    ? CATARACT EXTRACTION    ? CORONARY ARTERY BYPASS GRAFT    ? CORONARY STENT PLACEMENT    ? EYE SURGERY    ? ICD IMPLANT N/A 12/29/2017  ? Procedure: ICD IMPLANT;  Surgeon: Evans Lance, MD;  Location: [facility];  Service: Cardiovascular;  Laterality: N/A;  ? LEFT HEART CATH AND CORS/GRAFTS ANGIOGRAPHY N/A 12/29/2017  ? Procedure: LEFT HEART CATH AND CORS/GRAFTS ANGIOGRAPHY;  Surgeon: Martinique, Peter M, MD;  Location: [facility];  Service: Cardiovascular;  Laterality: N/A;  ? ? ?Social History:  ? reports that he quit smoking about 9 years ago. His smoking use included cigarettes. He has never used smokeless tobacco. He reports that he does not drink alcohol and does not use  drugs. ? ?Allergies  ?Allergen Reactions  ? Ipratropium Other (See Comments)  ? Metoprolol   ?  Was bringing blood pressure too low and he would pass out.  ? Morphine And Related Nausea Only  ? Vicodin [Hydrocodone-Acetaminophen] Other (See Comments)  ?  Hallucinations ?  ? Penicillins Rash  ?  Did it involve swelling of the face/tongue/throat, SOB, or low BP? No ?Did it involve sudden or severe rash/hives, skin peeling, or any reaction on the inside of your mouth or nose? Yes ?Did you need to seek medical  attention at a hospital or doctor's office? No ?When did it last happen? Over 7 Years Ago      ?If all above answers are "NO", may proceed with cephalosporin use. ? ?  ? ? ?Family History  ?Problem Relation Age of Onset  ? Hypertension Mother   ? Heart disease Mother   ? Hypertension Sister   ? Heart disease Sister   ? Thyroid disease Sister   ? Thyroid disease Brother   ?     hyperthyroid  ? ? ? ?Prior to Admission medications   ?Medication Sig Start Date End Date Taking? Authorizing Provider  ?albuterol (ACCUNEB) 0.63 MG/3ML nebulizer solution Take 3 mLs by nebulization 4 (four) times daily as needed for shortness of breath or wheezing. 3 ml inhale orally via nebulizer four times a day    [provider]  ?albuterol (PROVENTIL HFA;VENTOLIN HFA) 108 (90 Base) MCG/ACT inhaler Inhale 2 puffs into the lungs every 6 (six) hours as needed for wheezing or shortness of breath. 06/30/16   [provider]  ?allopurinol (ZYLOPRIM) 100 MG tablet Take 200 mg by mouth daily.    [provider]  ?aspirin EC 81 MG tablet Take 1 tablet (81 mg total) by mouth daily. Swallow whole. 03/22/22   Lyda Jester M, PA-C  ?atorvastatin (LIPITOR) 40 MG tablet TAKE 1 TABLET EVERY DAY 01/24/22   Evans Lance, MD  ?carvedilol (COREG) 25 MG tablet Take 50mg  in the AM and 37.5mg  in the PM 04/13/22   Sherran Needs, NP  ?cetirizine (ZYRTEC) 10 MG tablet Take 10 mg by mouth daily.    [provider]  ?cholecalciferol (VITAMIN D) 1000 units tablet Take 1,000 Units by mouth daily.    [provider]  ?Cyanocobalamin (VITAMIN B 12 PO) Take 1 tablet by mouth daily.    [provider]  ?ELIQUIS 5 MG TABS tablet TAKE 1 TABLET TWICE DAILY 11/01/21   Evans Lance, MD  ?ferrous sulfate 325 (65 FE) MG tablet Take 325 mg by mouth daily with breakfast. 01/03/18   [provider]  ?folic acid (FOLVITE) 102 MCG tablet Take 400 mcg by mouth daily.    [provider]  ?Multiple Vitamin  (MULTIVITAMIN) tablet Take 1 tablet by mouth daily.    [provider]  ?nitroGLYCERIN (NITROSTAT) 0.4 MG SL tablet PLACE 1 TABLET (0.4 MG TOTAL) UNDER THE TONGUE EVERY 5 (FIVE) MINUTES AS NEEDED FOR CHEST PAIN. 01/24/22   Evans Lance, MD  ?torsemide (DEMADEX) 20 MG tablet Take 2 tablets (40 mg total) by mouth 2 (two) times daily. 03/11/22 12/06/22  Domenic Polite, MD  ?Donnal Debar 100-62.5-25 MCG/ACT AEPB Inhale 1 puff into the lungs daily. 01/21/22   [provider]  ? ? ?Physical Exam: ?Vitals:  ? 04/15/22 1930 04/15/22 2000 04/15/22 2030 04/15/22 2100  ?BP: 132/84 133/80 (!) 143/70 (!) 162/78  ?Pulse: 92 87 87 81  ?Resp: (!) 21 Marland Kitchen)  28 (!) 29 (!) 29  ?SpO2: 98% 92% 94% 93%  ? ? ?Constitutional: No diaphoresis, no pallor  ?Eyes: PERTLA, lids and conjunctivae normal ?ENMT: Mucous membranes are moist. Posterior pharynx clear of any exudate or lesions.   ?Neck: supple, no masses  ?Respiratory: Tachypnea, fine rales b/l. No wheezing.  ?Cardiovascular: S1 & S2 heard, regular rate and rhythm. Mild LE edema bilaterally. ?Abdomen: no tenderness, soft. Bowel sounds active.  ?Musculoskeletal: no clubbing / cyanosis. No joint deformity upper and lower extremities.   ?Skin: no significant rashes, lesions, ulcers. Warm, dry, well-perfused. ?Neurologic: No gross facial asymmetry. Moving all extremities. Alert and oriented.  ?Psychiatric: Very pleasant. Cooperative.  ? ? ?Labs and Imaging on Admission: I have personally reviewed following labs and imaging studies ? ?CBC: ?Recent Labs  ?Lab 04/15/22 ?1840 04/15/22 ?1843  ?WBC 13.9*  --   ?NEUTROABS 9.4*  --   ?HGB 12.3* 12.6*  ?HCT 39.3 37.0*  ?MCV 98.5  --   ?PLT 405*  --   ? ?Basic Metabolic Panel: ?Recent Labs  ?Lab 04/15/22 ?1840 04/15/22 ?1843  ?NA 136 136  ?K 3.9 3.9  ?CL 105  --   ?CO2 21*  --   ?GLUCOSE 189*  --   ?BUN 21  --   ?CREATININE 2.58*  --   ?CALCIUM 9.2  --   ? ?GFR: ?Estimated Creatinine Clearance: 26.4 mL/min (A) (by C-G formula based  on SCr of 2.58 mg/dL (H)). ?Liver Function Tests: ?Recent Labs  ?Lab 04/15/22 ?1840  ?AST 50*  ?ALT 39  ?ALKPHOS 153*  ?BILITOT 0.7  ?PROT 7.2  ?ALBUMIN 3.8  ? ?No results for input(s): LIPASE, AMYLASE in the la

## 2022-04-15 NOTE — ED Provider Notes (Signed)
?Joe Wiggins ?Provider Note ? ? ?CSN: [id number] ?Arrival date & time: 04/15/22  1821 ? ?  ? ?History ? ?Chief Complaint  ?Patient presents with  ? Shortness of Breath  ? Chest Pain  ? ? ?Joe Wiggins is a 76 y.o. male. ? ?HPI ?76 year old male presents with resp distress. Started 3-4 hours ago according to EMS. Had CP and had afib with rvr according to EMS. Took 1 nitro at home.  Was given nebs by EMS.  Atrial fibrillation spontaneously converted, and now the chest pain is gone, still feeling short of breath and is in respiratory distress. BP 220s for EMS. ? ?Home Medications ?Prior to Admission medications   ?Medication Sig Start Date End Date Taking? Authorizing Provider  ?albuterol (PROVENTIL HFA;VENTOLIN HFA) 108 (90 Base) MCG/ACT inhaler Inhale 2 puffs into the lungs every 6 (six) hours as needed for wheezing or shortness of breath. 06/30/16  Yes [provider]  ?allopurinol (ZYLOPRIM) 100 MG tablet Take 200 mg by mouth daily.   Yes [provider]  ?aspirin EC 81 MG tablet Take 1 tablet (81 mg total) by mouth daily. Swallow whole. 03/22/22  Yes Lyda Jester M, PA-C  ?atorvastatin (LIPITOR) 40 MG tablet TAKE 1 TABLET EVERY DAY ?Patient taking differently: Take 40 mg by mouth daily. 01/24/22  Yes Evans Lance, MD  ?carvedilol (COREG) 25 MG tablet Take 50mg  in the AM and 37.5mg  in the PM 04/13/22  Yes Sherran Needs, NP  ?cetirizine (ZYRTEC) 10 MG tablet Take 10 mg by mouth daily.   Yes [provider]  ?cholecalciferol (VITAMIN D) 1000 units tablet Take 1,000 Units by mouth daily.   Yes [provider]  ?Cyanocobalamin (VITAMIN B 12 PO) Take 1 tablet by mouth daily.   Yes [provider]  ?ELIQUIS 5 MG TABS tablet TAKE 1 TABLET TWICE DAILY ?Patient taking differently: Take 5 mg by mouth 2 (two) times daily. 11/01/21  Yes Evans Lance, MD  ?ferrous sulfate 325 (65 FE) MG tablet Take 325 mg by mouth daily with breakfast.  01/03/18  Yes [provider]  ?folic acid (FOLVITE) 562 MCG tablet Take 400 mcg by mouth daily.   Yes [provider]  ?Multiple Vitamin (MULTIVITAMIN) tablet Take 1 tablet by mouth daily.   Yes [provider]  ?nitroGLYCERIN (NITROSTAT) 0.4 MG SL tablet PLACE 1 TABLET (0.4 MG TOTAL) UNDER THE TONGUE EVERY 5 (FIVE) MINUTES AS NEEDED FOR CHEST PAIN. 01/24/22  Yes Evans Lance, MD  ?torsemide (DEMADEX) 20 MG tablet Take 2 tablets (40 mg total) by mouth 2 (two) times daily. 03/11/22 12/06/22 Yes Domenic Polite, MD  ?Donnal Debar 100-62.5-25 MCG/ACT AEPB Inhale 1 puff into the lungs daily. 01/21/22  Yes [provider]  ?albuterol (ACCUNEB) 0.63 MG/3ML nebulizer solution Take 3 mLs by nebulization 4 (four) times daily as needed for shortness of breath or wheezing. 3 ml inhale orally via nebulizer four times a day ?Patient not taking: Reported on 04/15/2022    [provider]  ?   ? ?Allergies    ?Ipratropium, Metoprolol, Morphine and related, Vicodin [hydrocodone-acetaminophen], and Penicillins   ? ?Review of Systems   ?Review of Systems  ?Unable to perform ROS: Severe respiratory distress  ? ?Physical Exam ?Updated Vital Signs ?BP (!) 153/63 (BP Location: Right Arm)   Pulse 79   Temp 97.9 ?F (36.6 ?C) (Oral)   Resp 20   Ht 5\' 9"  (1.753 m)   Wt  85.2 kg   SpO2 99%   BMI 27.73 kg/m?  ?Physical Exam ?Vitals and nursing note reviewed.  ?Constitutional:   ?   General: He is not in acute distress. ?   Appearance: He is well-developed. He is ill-appearing and diaphoretic.  ?HENT:  ?   Head: Normocephalic and atraumatic.  ?Cardiovascular:  ?   Rate and Rhythm: Normal rate and regular rhythm.  ?   Heart sounds: Normal heart sounds.  ?Pulmonary:  ?   Effort: Tachypnea, accessory muscle usage and respiratory distress present.  ?   Breath sounds: Examination of the right-lower field reveals rales. Examination of the left-lower field reveals rales. Rales present.  ?Abdominal:  ?    Palpations: Abdomen is soft.  ?   Tenderness: There is no abdominal tenderness.  ?Musculoskeletal:  ?   Right lower leg: Edema present.  ?   Left lower leg: Edema present.  ?   Comments: Bilateral ankle edema  ?Skin: ?   Comments: Mottling noted to the abdomen  ?Neurological:  ?   Mental Status: He is alert.  ? ? ?ED Results / Procedures / Treatments   ?Labs ?(all labs ordered are listed, but only abnormal results are displayed) ?Labs Reviewed  ?COMPREHENSIVE METABOLIC PANEL - Abnormal; Notable for the following components:  ?    Result Value  ? CO2 21 (*)   ? Glucose, Bld 189 (*)   ? Creatinine, Ser 2.58 (*)   ? AST 50 (*)   ? Alkaline Phosphatase 153 (*)   ? GFR, Estimated 25 (*)   ? All other components within normal limits  ?BRAIN NATRIURETIC PEPTIDE - Abnormal; Notable for the following components:  ? B Natriuretic Peptide 1,181.1 (*)   ? All other components within normal limits  ?CBC WITH DIFFERENTIAL/PLATELET - Abnormal; Notable for the following components:  ? WBC 13.9 (*)   ? RBC 3.99 (*)   ? Hemoglobin 12.3 (*)   ? Platelets 405 (*)   ? Neutro Abs 9.4 (*)   ? Basophils Absolute 0.2 (*)   ? Abs Immature Granulocytes 0.28 (*)   ? All other components within normal limits  ?BASIC METABOLIC PANEL - Abnormal; Notable for the following components:  ? Glucose, Bld 168 (*)   ? Creatinine, Ser 2.51 (*)   ? GFR, Estimated 26 (*)   ? All other components within normal limits  ?HEPATIC FUNCTION PANEL - Abnormal; Notable for the following components:  ? Alkaline Phosphatase 139 (*)   ? All other components within normal limits  ?MAGNESIUM - Abnormal; Notable for the following components:  ? Magnesium 2.5 (*)   ? All other components within normal limits  ?CBC - Abnormal; Notable for the following components:  ? WBC 14.6 (*)   ? RBC 3.41 (*)   ? Hemoglobin 10.9 (*)   ? HCT 32.6 (*)   ? All other components within normal limits  ?I-STAT ARTERIAL BLOOD GAS, ED - Abnormal; Notable for the following components:  ? pH,  Arterial 7.268 (*)   ? pCO2 arterial 56.3 (*)   ? pO2, Arterial 60 (*)   ? HCT 37.0 (*)   ? Hemoglobin 12.6 (*)   ? All other components within normal limits  ?TROPONIN I (HIGH SENSITIVITY) - Abnormal; Notable for the following components:  ? Troponin I (High Sensitivity) 38 (*)   ? All other components within normal limits  ?TROPONIN I (HIGH SENSITIVITY) - Abnormal; Notable for the following components:  ? Troponin I (High  Sensitivity) 116 (*)   ? All other components within normal limits  ?RESP PANEL BY RT-PCR (FLU A&B, COVID) ARPGX2  ?CBG MONITORING, ED  ? ? ?EKG ?EKG Interpretation ? ?Date/Time:  Friday April 15 2022 18:22:58 EDT ?Ventricular Rate:  100 ?PR Interval:    ?QRS Duration: 129 ?QT Interval:  368 ?QTC Calculation: 475 ?R Axis:   -4 ?Text Interpretation: Atrial flutter Left bundle branch block Confirmed by Sherwood Gambler [phone]) on 04/15/2022 8:30:29 PM ? ?Radiology ?DG Chest Portable 1 View ? ?Result Date: 04/15/2022 ?CLINICAL DATA:  Shortness of breath, chest pain, respiratory distress EXAM: PORTABLE CHEST 1 VIEW COMPARISON:  03/07/2022 FINDINGS: Prior CABG. Left AICD remains in place, unchanged. Aortic calcifications. Heart is normal size. Bilateral lower lobe airspace disease, new since prior study. Underlying COPD. No effusions or acute bony abnormality. IMPRESSION: Bilateral lower lobe airspace opacities could reflect edema or infection. Underlying COPD. Electronically Signed   By: Rolm Baptise M.D.   On: 04/15/2022 19:42  ? ?CUP PACEART REMOTE DEVICE CHECK ? ?Result Date: 04/15/2022 ?Scheduled remote reviewed. Normal device function.  [place] below baseline, HR's increased.  This was routed 4/18 Next remote 91 days. [place]  ? ?Procedures ?Marland KitchenCritical Care ?Performed by: Sherwood Gambler, MD ?Authorized by: Sherwood Gambler, MD  ? ?Critical care provider statement:  ?  Critical care time (minutes):  55 ?  Critical care time was exclusive of:  Separately billable procedures and treating other patients ?   Critical care was necessary to treat or prevent imminent or life-threatening deterioration of the following conditions:  Respiratory failure ?  Critical care was time spent personally by me on the following activiti

## 2022-04-16 DIAGNOSIS — I495 Sick sinus syndrome: Secondary | ICD-10-CM

## 2022-04-16 DIAGNOSIS — I169 Hypertensive crisis, unspecified: Secondary | ICD-10-CM | POA: Diagnosis not present

## 2022-04-16 DIAGNOSIS — I5043 Acute on chronic combined systolic (congestive) and diastolic (congestive) heart failure: Secondary | ICD-10-CM | POA: Diagnosis not present

## 2022-04-16 DIAGNOSIS — I480 Paroxysmal atrial fibrillation: Secondary | ICD-10-CM | POA: Diagnosis not present

## 2022-04-16 DIAGNOSIS — N184 Chronic kidney disease, stage 4 (severe): Secondary | ICD-10-CM | POA: Diagnosis not present

## 2022-04-16 NOTE — Progress Notes (Signed)
?Progress Note ? ? ?Patient: Joe Wiggins YYT:[id number] DOB: [birth year] DOA: 04/15/2022     1 ?DOS: the patient was seen and examined on 04/16/2022 ?  ?Brief hospital course: ?Joe Wiggins was admitted to the hospital with the working diagnosis of decompensated heart failure.  ? ?76 yo male with the past medical history of CAD, paroxysmal atrial fibrillation, COPD, chronic kidney disease stage 4, dementia and heart failure who presented with dyspnea. Reported several days of progressive dyspnea, along with weight gain. Noted non compliance with sodium restriction at home. On the day of hospitalization he developed severe dyspnea, EMS was called and patient was brought to the hospital. On his initial physical examination his blood pressure was 210/124, HR 92, RR 29, 02 saturation 92%, lungs with fine rales but not wheezing, heart with S1 and S2 present and rhythmic, no gallops or rubs, abdomen not distended, positive lower extremity edema.  ? ?ABG 7.26/ 65/ 60/27 86% ?Na 136, K 3,9. Cl 105 bicarbonate 21, glucose 189 bun 21 cr 2,58 ?BNP 1,181  ?High sensitive troponin 38 and 116  ?Wbc 13,9 hgb 12,3 plt 405  ?Sars covid 19 negative  ? ?Chest radiograph with cardiomegaly, with bilateral lower lobes interstitial infiltrates at the lower lobes with bilateral hilar vascular congestion. Pacer in place with 3 lead, biventricular pacer with defibrillator.  ? ?EKG 100 bpm, normal axis, left bundle branch block, sinus rhythm with no significant ST segment or T wave changes.  ? ?Assessment and Plan: ?* Acute on chronic combined systolic and diastolic CHF (congestive heart failure) ([place]) ?Echocardiogram with reduced LV systolic function to 40 to 45%, antero lateral wall and posterior wall hypokinesis. RV systolic function preserved, mild to moderate tricuspid valve regurgitation.  ? ?Urine output over last 24 hrs is 1,025 ml ?Blood pressure systolic is 275 mmHg range.  ? ?Plan to continue diuresis with furosemide 40 mg IV q12  hrs ?Continue with carvedilol. ?Limited therapy due to renal failure.  ? ? ?Hypertensive crisis ?Improved blood pressure with diuresis, plan to continue with furosemide and carvedilol.  ? ?CKD (chronic kidney disease) stage 4, GFR 15-29 ml/min (HCC) ?Renal function with serum cr at 2,51 with K at 3,9 and serum bicarbonate at 23. ?Plan to continue diuresis with furosemide and plan to follow up renal function in am.  ? ?Paroxysmal atrial fibrillation ([place]) ?Continue rate control with carvedilol ?Anticoagulation with apixaban.  ? ?CAD (coronary artery disease), native coronary artery ?No chest pain. ? ?Dyslipidemia. Continue with statin therapy.  ? ?COPD (chronic obstructive pulmonary disease) ([place]) ?No clinical signs of exacerbation, continue with bronchodilator therapy.  ? ?Sinus node dysfunction (HCC) ?Continue telemetry monitor ?Patient has dual pacer in place.  ? ? ? ? ?  ? ?Subjective: Patient is feeling better, but not yet back to baseline, he has not been compliant with low salt diet at home  ? ?Physical Exam: ?Vitals:  ? 04/16/22 0500 04/16/22 0755 04/16/22 0906 04/16/22 1211  ?BP:  (!) 154/92  (!) 153/70  ?Pulse:  75  74  ?Resp:  20  20  ?Temp:  98.7 ?F (37.1 ?C)  98.7 ?F (37.1 ?C)  ?TempSrc:  Oral  Oral  ?SpO2:  95% 97%   ?Weight: 82.8 kg     ?Height:      ? ?Neurology awake and alert ?ENT with mild pallor ?Cardiovascular with S1 and S2 present rhythmic ,with no gallops, or rubs, positive murmur at the right sternal border. ?Positive moderate JVD ?Trace lower extremity edema ?Respiratory with  positive rales bilaterally ?Abdomen not distended ?Data Reviewed: ? ? ? ?Family Communication: I spoke with patient's wife at the bedside, we talked in detail about patient's condition, plan of care and prognosis and all questions were addressed. ? ? ?Disposition: ?Status is: Inpatient ?Remains inpatient appropriate because: heart failure  ? Planned Discharge Destination: Home ? ?Author: ?Tawni Millers,  MD ?04/16/2022 4:09 PM ? ?For on call review [email or web address].  ?

## 2022-04-16 NOTE — Assessment & Plan Note (Addendum)
Blood pressure has improved with hydralazine, at the time of discharge his systolic blood pressure has been 145 to 128 mmHg.  ? ?Plan to continue with hydralazine, carvedilol and diuresis with torsemide.  ?

## 2022-04-16 NOTE — Assessment & Plan Note (Addendum)
Acute hypoxemic and hypercapnic respiratory failure due to acute cardiogenic pulmonary edema.  ? ?Patient was admitted to the cardiac ward, he was placed on IV furosemide, negative fluid balance was achieved, -2,086, with improvement in his symptoms.  ? ?Echocardiogram with reduced LV systolic function to 40 to 45%, antero lateral wall and posterior wall hypokinesis. RV systolic function preserved, mild to moderate tricuspid valve regurgitation.  ? ?Patient will continue heart failure management with carvedilol, plus hydralazine for afterload reduction.  ?Continue diuresis with torsemide at home. ?Not on SGLT21, ace or arb/ entresto due to low GFR and risk of worsening renal function.  ? ?At the time of his discharge his oxygenation is 97% on 2 L/min per [place].  ? ?

## 2022-04-16 NOTE — Assessment & Plan Note (Addendum)
Patient has dual pacer in place.  ?

## 2022-04-16 NOTE — Assessment & Plan Note (Addendum)
No clinical signs of exacerbation, continue with bronchodilator therapy.  ?Continue supplemental 02 per [place] ?At home uses supplement 02 at night with Cpap,  ? ?Will check ambulatory oxymetry today on room air, patient may qualify for portable 02.   ?

## 2022-04-16 NOTE — Assessment & Plan Note (Signed)
Continue rate control with carvedilol ?Anticoagulation with apixaban.  ?

## 2022-04-16 NOTE — Progress Notes (Signed)
RT NOTE: ? ?Pt refuses CPAP therapy. He stated he is fine with just oxygen.  ?

## 2022-04-16 NOTE — Hospital Course (Addendum)
[name] was admitted to the hospital with the working diagnosis of decompensated heart failure.  ? ?76 yo male with the past medical history of CAD, paroxysmal atrial fibrillation, COPD, chronic kidney disease stage 4, dementia and heart failure who presented with dyspnea. Reported several days of progressive dyspnea, along with weight gain. Noted non compliance with sodium restriction at home. On the day of hospitalization he developed severe dyspnea, EMS was called and patient was brought to the hospital. On his initial physical examination his blood pressure was 210/124, HR 92, RR 29, 02 saturation 92%, lungs with bilateral rales but not wheezing, heart with S1 and S2 present and rhythmic, no gallops or rubs, abdomen not distended, positive lower extremity edema.  ? ?ABG 7.26/ 65/ 60/27 86% ?Na 136, K 3,9. Cl 105 bicarbonate 21, glucose 189 bun 21 cr 2,58 ?BNP 1,181  ?High sensitive troponin 38 and 116  ?Wbc 13,9 hgb 12,3 plt 405  ?Sars covid 19 negative  ? ?Chest radiograph with cardiomegaly, with bilateral lower lobes interstitial infiltrates at the lower lobes with bilateral hilar vascular congestion. Pacer in place with 3 lead, biventricular pacer with defibrillator.  ? ?EKG 100 bpm, normal axis, left bundle branch block, sinus rhythm with no significant ST segment or T wave changes.  ? ?Patient was placed on IV furosemide for diuresis with good toleration.  ?Improvement in volume status.  ?Patient will follow up as outpatient, his wife has been advised about salt restriction at home.  ? ?Patient developed hypotension delaying his discharge for 24 hrs. Hydralazine has been stopped and plan to follow up as outpatient.  ?

## 2022-04-16 NOTE — Assessment & Plan Note (Addendum)
Renal function remains stable with serum cr at 2,4 with K at 3,9 and serum bicarbonate at 22.  ?At discharge will continue with torsemide and plan to follow up renal function as outpatient.  ?

## 2022-04-16 NOTE — Assessment & Plan Note (Signed)
No chest pain. ? ?Dyslipidemia. Continue with statin therapy.  ?

## 2022-04-17 LAB — BASIC METABOLIC PANEL
Anion gap: 9 (ref 5–15)
BUN: 32 mg/dL — ABNORMAL HIGH (ref 8–23)
CO2: 23 mmol/L (ref 22–32)
Calcium: 9.6 mg/dL (ref 8.9–10.3)
Chloride: 105 mmol/L (ref 98–111)
Creatinine, Ser: 2.5 mg/dL — ABNORMAL HIGH (ref 0.61–1.24)
GFR, Estimated: 26 mL/min — ABNORMAL LOW (ref >60)
Glucose, Bld: 129 mg/dL — ABNORMAL HIGH (ref 70–99)
Potassium: 3.9 mmol/L (ref 3.5–5.1)
Sodium: 137 mmol/L (ref 135–145)

## 2022-04-17 LAB — LIPID PANEL
Cholesterol: 164 mg/dL (ref 0–200)
HDL: 28 mg/dL — ABNORMAL LOW (ref >40)
LDL Cholesterol: 105 mg/dL — ABNORMAL HIGH (ref 0–99)
Total CHOL/HDL Ratio: 5.9 RATIO
Triglycerides: 156 mg/dL — ABNORMAL HIGH (ref <150)
VLDL: 31 mg/dL (ref 0–40)

## 2022-04-17 LAB — HEMOGLOBIN A1C
Hgb A1c MFr Bld: 5.8 % — ABNORMAL HIGH (ref 4.8–5.6)
Mean Plasma Glucose: 119.76 mg/dL

## 2022-04-17 LAB — MAGNESIUM: Magnesium: 2.5 mg/dL — ABNORMAL HIGH (ref 1.7–2.4)

## 2022-04-17 MED ORDER — HYDRALAZINE HCL 50 MG PO TABS
50.0000 mg | ORAL_TABLET | Freq: Two times a day (BID) | ORAL | Status: DC
  Administered 2022-04-17 – 2022-04-18 (×3): 50 mg via ORAL
  Filled 2022-04-17 (×3): qty 1

## 2022-04-17 MED ORDER — TORSEMIDE 20 MG PO TABS
40.0000 mg | ORAL_TABLET | Freq: Every day | ORAL | Status: DC
  Administered 2022-04-18 – 2022-04-19 (×2): 40 mg via ORAL
  Filled 2022-04-17 (×2): qty 2

## 2022-04-17 NOTE — Plan of Care (Signed)
?  Problem: Education: ?Goal: Ability to demonstrate management of disease process will improve ?Outcome: Progressing ?  ?Problem: Activity: ?Goal: Capacity to carry out activities will improve ?Outcome: Progressing ?  ?Problem: Education: ?Goal: Knowledge of disease or condition will improve ?Outcome: Progressing ?  ?

## 2022-04-18 ENCOUNTER — Other Ambulatory Visit (HOSPITAL_COMMUNITY): Payer: Self-pay

## 2022-04-18 DIAGNOSIS — N184 Chronic kidney disease, stage 4 (severe): Secondary | ICD-10-CM | POA: Diagnosis not present

## 2022-04-18 DIAGNOSIS — I5043 Acute on chronic combined systolic (congestive) and diastolic (congestive) heart failure: Secondary | ICD-10-CM | POA: Diagnosis not present

## 2022-04-18 DIAGNOSIS — I169 Hypertensive crisis, unspecified: Secondary | ICD-10-CM | POA: Diagnosis not present

## 2022-04-18 DIAGNOSIS — I480 Paroxysmal atrial fibrillation: Secondary | ICD-10-CM | POA: Diagnosis not present

## 2022-04-18 DIAGNOSIS — D509 Iron deficiency anemia, unspecified: Secondary | ICD-10-CM | POA: Diagnosis present

## 2022-04-18 LAB — BASIC METABOLIC PANEL
Anion gap: 6 (ref 5–15)
BUN: 37 mg/dL — ABNORMAL HIGH (ref 8–23)
CO2: 27 mmol/L (ref 22–32)
Calcium: 9.3 mg/dL (ref 8.9–10.3)
Chloride: 107 mmol/L (ref 98–111)
Creatinine, Ser: 2.6 mg/dL — ABNORMAL HIGH (ref 0.61–1.24)
GFR, Estimated: 25 mL/min — ABNORMAL LOW (ref >60)
Glucose, Bld: 101 mg/dL — ABNORMAL HIGH (ref 70–99)
Potassium: 3.5 mmol/L (ref 3.5–5.1)
Sodium: 140 mmol/L (ref 135–145)

## 2022-04-18 MED ORDER — SODIUM CHLORIDE 0.9 % IV BOLUS: 250.0000 mL | Freq: Once | INTRAVENOUS | Status: DC

## 2022-04-18 MED ORDER — ENSURE ENLIVE PO LIQD
237.0000 mL | Freq: Two times a day (BID) | ORAL | Status: DC
  Administered 2022-04-18 – 2022-04-19 (×2): 237 mL via ORAL

## 2022-04-18 MED ORDER — BISACODYL 5 MG PO TBEC
5.0000 mg | DELAYED_RELEASE_TABLET | Freq: Once | ORAL | Status: AC
  Administered 2022-04-18: 5 mg via ORAL
  Filled 2022-04-18: qty 1

## 2022-04-18 MED ORDER — HYDRALAZINE HCL 50 MG PO TABS
50.0000 mg | ORAL_TABLET | Freq: Two times a day (BID) | ORAL | 0 refills | Status: DC
  Filled 2022-04-18: qty 60, 30d supply, fill #0

## 2022-04-18 MED ORDER — TORSEMIDE 20 MG PO TABS
60.0000 mg | ORAL_TABLET | Freq: Every day | ORAL | 0 refills | Status: DC
  Filled 2022-04-18: qty 90, 30d supply, fill #0

## 2022-04-18 MED ORDER — POLYETHYLENE GLYCOL 3350 17 G PO PACK
17.0000 g | PACK | Freq: Every day | ORAL | Status: DC
  Administered 2022-04-18 – 2022-04-19 (×2): 17 g via ORAL
  Filled 2022-04-18 (×2): qty 1

## 2022-04-18 NOTE — Evaluation (Signed)
Occupational Therapy Evaluation ?Patient Details ?Name: Joe Wiggins ?MRN: [id number] ?DOB: 06/03/1946 ?Today's Date: 04/18/2022 ? ? ?History of Present Illness 76 yo male presenting 4/21 with chest pain, SOB, and afib with RVR. PMH includes: COPD, asthma, CAD with NSTEMI and CABG in 02/2022, vascular dementia, CHF, OSA with CPAP use, and CKD IV.  ? ?Clinical Impression ?  ?PTA, pt was living with his wife and required assistance for ADLs (as needed for time management). Pt currently requiring Min A for UB ADLs, Mod A for LB ADLs, and functional mobility with Min Guard A and RW. Pt presenting with decreased balance, strength, and activity tolerance. SpO2 > 89% on RA, mostly 90-94%. HR in 60-70s during gait, then spiking to 140s when pt sitting at rest after mobility; notified RN. Pt would benefit from further acute OT to facilitate safe dc. Recommend dc to home once medically stable per physician.     ? ?Recommendations for follow up therapy are one component of a multi-disciplinary discharge planning process, led by the attending physician.  Recommendations may be updated based on patient status, additional functional criteria and insurance authorization.  ? ?Follow Up Recommendations ? Other (comment) (OP PT)  ?  ?Assistance Recommended at Discharge Frequent or constant Supervision/Assistance  ?Patient can return home with the following A little help with walking and/or transfers;A little help with bathing/dressing/bathroom ? ?  ?Functional Status Assessment ? Patient has had a recent decline in their functional status and demonstrates the ability to make significant improvements in function in a reasonable and predictable amount of time.  ?Equipment Recommendations ? None recommended by OT  ?  ?Recommendations for Other Services PT consult ? ? ?  ?Precautions / Restrictions Precautions ?Precautions: Fall;Other (comment) ?Precaution Comments: watch HR ?Restrictions ?Weight Bearing Restrictions: No  ? ?  ? ?Mobility Bed  Mobility ?Overal bed mobility: Needs Assistance ?  ?  ?  ?  ?  ?  ?General bed mobility comments: OOB in recliner at start and end of session ?  ? ?Transfers ?Overall transfer level: Needs assistance ?Equipment used: None, Rolling walker (2 wheels) ?Transfers: Sit to/from Stand ?Sit to Stand: Min guard ?  ?  ?  ?  ?  ?General transfer comment: minG with BUE on armrests to rise, able to stabilize in standing without UE support, also able to stabilize with RW ?  ? ?  ?Balance Overall balance assessment: Mild deficits observed, not formally tested ?  ?  ?  ?  ?  ?  ?  ?  ?  ?  ?  ?  ?  ?  ?  ?  ?  ?  ?   ? ?ADL either performed or assessed with clinical judgement  ? ?ADL Overall ADL's : Needs assistance/impaired ?Eating/Feeding: Set up;Supervision/ safety;Sitting ?  ?Grooming: Supervision/safety;Set up;Sitting ?  ?Upper Body Bathing: Minimal assistance;Sitting ?  ?Lower Body Bathing: Moderate assistance;Sit to/from stand ?  ?Upper Body Dressing : Minimal assistance;Sitting ?  ?Lower Body Dressing: Moderate assistance;Sit to/from stand ?  ?Toilet Transfer: Min guard;Ambulation;Rolling walker (2 wheels) (simulated) ?  ?  ?  ?  ?  ?Functional mobility during ADLs: Min guard;Rolling walker (2 wheels) ?General ADL Comments: Pt demonstrating near baseline function. Presenting with decreased balance, strength, and activity tolerance.  ? ? ? ?Vision Baseline Vision/History: 1 Wears glasses ?   ?   ?Perception   ?  ?Praxis   ?  ? ?Pertinent Vitals/Pain    ? ? ? ?Hand Dominance Right ?  ?  Extremity/Trunk Assessment Upper Extremity Assessment ?Upper Extremity Assessment: Overall WFL for tasks assessed ?  ?Lower Extremity Assessment ?Lower Extremity Assessment: Defer to PT evaluation ?  ?Cervical / Trunk Assessment ?Cervical / Trunk Assessment: Kyphotic ?  ?Communication Communication ?Communication: HOH ?  ?Cognition Arousal/Alertness: Awake/alert ?Behavior During Therapy: [facility] for tasks assessed/performed ?Overall Cognitive  Status: History of cognitive impairments - at baseline ?  ?  ?  ?  ?  ?  ?  ?  ?  ?  ?  ?  ?  ?  ?  ?  ?General Comments: pt with hx of vascular dementia, able to follow simple cues and instructions through session. Per wife, pt can be difficult to encourage at baseline as he is relatively sedentary ?  ?  ?General Comments  SpO2 > 89% on RA, mostly 90-94%. HR to 130s with pt sitting at rest prior to session, in 60-70s during gait, then spiking to 140s when pt sitting at rest after mobility. ? ?  ?Exercises   ?  ?Shoulder Instructions    ? ? ?Home Living Family/patient expects to be discharged to:: Private residence ?Living Arrangements: Spouse/significant other ?Available Help at Discharge: Family;Available 24 hours/day ?Type of Home: House ?Home Access: Stairs to enter ?Entrance Stairs-Number of Steps: 2 ?Entrance Stairs-Rails: Right;Left ?Home Layout: One level ?  ?  ?Bathroom Shower/Tub: Tub/shower unit ?  ?Bathroom Toilet: Standard ?  ?  ?Home Equipment: Conservation officer, nature (2 wheels);Cane - single point;BSC/3in1;Shower seat;Wheelchair - Psychologist, educational ?  ?  ?  ? ?  ?Prior Functioning/Environment Prior Level of Function : Independent/Modified Independent ?  ?  ?  ?  ?  ?  ?Mobility Comments: Independent ambulating without DME. Primarily sedentary, enjoys watching TV. Wife drives. Denies recent falls in past 6 months ?ADLs Comments: wife performs IADLs, wife helps with dressing and washing for time-sake ?  ? ?  ?  ?OT Problem List: Decreased strength;Decreased range of motion;Decreased activity tolerance;Impaired balance (sitting and/or standing);Decreased knowledge of use of DME or AE;Decreased knowledge of precautions ?  ?   ?OT Treatment/Interventions: Self-care/ADL training;Therapeutic exercise;Energy conservation;DME and/or AE instruction;Therapeutic activities;Patient/family education  ?  ?OT Goals(Current goals can be found in the care plan section) Acute Rehab OT Goals ?Patient Stated Goal: Go  home ?OT Goal Formulation: With patient/family ?Time For Goal Achievement: 05/02/22 ?Potential to Achieve Goals: Good  ?OT Frequency: Min 2X/week ?  ? ?Co-evaluation   ?  ?  ?  ?  ? ?  ?AM-PAC OT "6 Clicks" Daily Activity     ?Outcome Measure Help from another person eating meals?: None ?Help from another person taking care of personal grooming?: A Little ?Help from another person toileting, which includes using toliet, bedpan, or urinal?: A Little ?Help from another person bathing (including washing, rinsing, drying)?: A Lot ?Help from another person to put on and taking off regular upper body clothing?: A Little ?Help from another person to put on and taking off regular lower body clothing?: A Lot ?6 Click Score: 17 ?  ?End of Session Equipment Utilized During Treatment: Rolling walker (2 wheels);Gait belt ?Nurse Communication: Mobility status ? ?Activity Tolerance: Patient tolerated treatment well ?Patient left: in chair;with call bell/phone within reach;with chair alarm set;with family/visitor present ? ?OT Visit Diagnosis: Unsteadiness on feet (R26.81);Other abnormalities of gait and mobility (R26.89);Muscle weakness (generalized) (M62.81)  ?              ?Time: 6720-9470 ?OT Time Calculation (min): 21 min ?Charges:  OT General  Charges ?$OT Visit: 1 Visit ?OT Evaluation ?$OT Eval Moderate Complexity: 1 Mod ? ?Tyjon Bowen MSOT, OTR/L ?Acute Rehab ?Pager: [phone] ?Office: [phone] ? ?Gilberto Streck M Ulonda Klosowski ?04/18/2022, 1:43 PM ?

## 2022-04-18 NOTE — Discharge Summary (Addendum)
?Physician Discharge Summary ?  ?Patient: Joe Wiggins MRN: [id number] DOB: 02-Aug-1946  ?Admit date:     04/15/2022  ?Discharge date: 04/18/22  ?Discharge Physician: Joe Wiggins  ? ?PCP: Joe Pardon, MD  ? ?Recommendations at discharge:  ? ? Increase dose of torsemide to 60 mg daily ?Advice for salt (2 g) and fluid (1200 to1500 ml) restriction  ?Follow up renal function as outpatient in 7 days.  ?Patient developed hypotension with hydralazine 50 mg bid, plan to follow up as outpatient. If recurrent hypertension may consider 25 mg hydralazine bid. ?Guideline directed heart failure medical therapy is limited due to reduced GFR.  ? ?Discharge Diagnoses: ?Principal Problem: ?  Acute on chronic combined systolic and diastolic CHF (congestive heart failure) ([place]) ?Active Problems: ?  Hypertensive crisis ?  CKD (chronic kidney disease) stage 4, GFR 15-29 ml/min (HCC) ?  Paroxysmal atrial fibrillation (HCC) ?  CAD (coronary artery disease), native coronary artery ?  COPD (chronic obstructive pulmonary disease) ([place]) ?  Sinus node dysfunction (HCC) ? ?Resolved Problems: ?  * No resolved hospital problems. * ? ?Hospital Course: ?Joe Wiggins was admitted to the hospital with the working diagnosis of decompensated heart failure.  ? ?76 yo male with the past medical history of CAD, paroxysmal atrial fibrillation, COPD, chronic kidney disease stage 4, dementia and heart failure who presented with dyspnea. Reported several days of progressive dyspnea, along with weight gain. Noted non compliance with sodium restriction at home. On the day of hospitalization he developed severe dyspnea, EMS was called and patient was brought to the hospital. On his initial physical examination his blood pressure was 210/124, HR 92, RR 29, 02 saturation 92%, lungs with bilateral rales but not wheezing, heart with S1 and S2 present and rhythmic, no gallops or rubs, abdomen not distended, positive lower extremity edema.  ? ?ABG 7.26/ 65/  60/27 86% ?Na 136, K 3,9. Cl 105 bicarbonate 21, glucose 189 bun 21 cr 2,58 ?BNP 1,181  ?High sensitive troponin 38 and 116  ?Wbc 13,9 hgb 12,3 plt 405  ?Sars covid 19 negative  ? ?Chest radiograph with cardiomegaly, with bilateral lower lobes interstitial infiltrates at the lower lobes with bilateral hilar vascular congestion. Pacer in place with 3 lead, biventricular pacer with defibrillator.  ? ?EKG 100 bpm, normal axis, left bundle branch block, sinus rhythm with no significant ST segment or T wave changes.  ? ?Patient was placed on IV furosemide for diuresis with good toleration.  ?Improvement in volume status.  ?Patient will follow up as outpatient, his wife has been advised about salt restriction at home.  ? ?Patient developed hypotension delaying his discharge for 24 hrs. Hydralazine has been stopped and plan to follow up as outpatient.  ? ?Assessment and Plan: ?* Acute on chronic combined systolic and diastolic CHF (congestive heart failure) ([place]) ?Acute hypoxemic and hypercapnic respiratory failure due to acute cardiogenic pulmonary edema.  ? ?Patient was admitted to the cardiac ward, he was placed on IV furosemide, negative fluid balance was achieved, -2,086, with improvement in his symptoms.  ? ?Echocardiogram with reduced LV systolic function to 40 to 45%, antero lateral wall and posterior wall hypokinesis. RV systolic function preserved, mild to moderate tricuspid valve regurgitation.  ? ?Patient will continue heart failure management with carvedilol, plus hydralazine for afterload reduction.  ?Continue diuresis with torsemide at home. ?Not on SGLT21, ace or arb/ entresto due to low GFR and risk of worsening renal function.  ? ?At the time of his discharge his  oxygenation is 97% on 2 L/min per [place].  ? ? ?Hypertensive crisis ?Blood pressure has improved with hydralazine, at the time of discharge his systolic blood pressure has been 145 to 128 mmHg.  ? ?Plan to continue with hydralazine, carvedilol and  diuresis with torsemide.  ? ?CKD (chronic kidney disease) stage 4, GFR 15-29 ml/min (HCC) ?Renal function remains stable with serum cr at 2,4 with K at 3,9 and serum bicarbonate at 22.  ?At discharge will continue with torsemide and plan to follow up renal function as outpatient.  ? ?Paroxysmal atrial fibrillation ([place]) ?Continue rate control with carvedilol ?Anticoagulation with apixaban.  ? ?CAD (coronary artery disease), native coronary artery ?No chest pain. ? ?Dyslipidemia. Continue with statin therapy.  ? ?COPD (chronic obstructive pulmonary disease) ([place]) ?No clinical signs of exacerbation, continue with bronchodilator therapy.  ?Continue supplemental 02 per [place] ?At home uses supplement 02 at night with Cpap,  ? ?Will check ambulatory oxymetry today on room air, patient may qualify for portable 02.   ? ?Sinus node dysfunction (HCC) ?Patient has dual pacer in place.  ? ?Iron deficiency anemia ?Continue with oral iron supplementation  ? ? ? ? ?  ? ? ?Consultants: none  ?Procedures performed: none  ?Disposition: Home ?Diet recommendation:  ?Cardiac diet ?DISCHARGE MEDICATION: ?Allergies as of 04/19/2022   ? ?   Reactions  ? Ipratropium Other (See Comments)  ? Metoprolol   ? Was bringing blood pressure too low and he would pass out.  ? Morphine And Related Nausea Only  ? Vicodin [hydrocodone-acetaminophen] Other (See Comments)  ? Hallucinations  ? Penicillins Rash  ? Did it involve swelling of the face/tongue/throat, SOB, or low BP? No ?Did it involve sudden or severe rash/hives, skin peeling, or any reaction on the inside of your mouth or nose? Yes ?Did you need to seek medical attention at a hospital or doctor's office? No ?When did it last happen? Over 7 Years Ago      ?If all above answers are "NO", may proceed with cephalosporin use.  ? ?  ? ?  ?Medication List  ?  ? ?TAKE these medications   ? ?albuterol 108 (90 Base) MCG/ACT inhaler ?Commonly known as: VENTOLIN HFA ?Inhale 2 puffs into the lungs every 6  (six) hours as needed for wheezing or shortness of breath. ?  ?allopurinol 100 MG tablet ?Commonly known as: ZYLOPRIM ?Take 200 mg by mouth daily. ?  ?aspirin EC 81 MG tablet ?Take 1 tablet (81 mg total) by mouth daily. Swallow whole. ?  ?atorvastatin 40 MG tablet ?Commonly known as: LIPITOR ?TAKE 1 TABLET EVERY DAY ?  ?carvedilol 25 MG tablet ?Commonly known as: COREG ?Take 50mg  in the AM and 37.5mg  in the PM ?  ?cetirizine 10 MG tablet ?Commonly known as: ZYRTEC ?Take 10 mg by mouth daily. ?  ?cholecalciferol 25 MCG (1000 UNIT) tablet ?Commonly known as: VITAMIN D ?Take 1,000 Units by mouth daily. ?  ?Eliquis 5 MG Tabs tablet ?Generic drug: apixaban ?TAKE 1 TABLET TWICE DAILY ?What changed: how much to take ?  ?feeding supplement Liqd ?Take 237 mLs by mouth 2 (two) times daily between meals. ?  ?ferrous sulfate 325 (65 FE) MG tablet ?Take 325 mg by mouth daily with breakfast. ?  ?folic acid 659 MCG tablet ?Commonly known as: FOLVITE ?Take 400 mcg by mouth daily. ?  ?multivitamin tablet ?Take 1 tablet by mouth daily. ?  ?nitroGLYCERIN 0.4 MG SL tablet ?Commonly known as: NITROSTAT ?PLACE 1 TABLET (0.4 MG  TOTAL) UNDER THE TONGUE EVERY 5 (FIVE) MINUTES AS NEEDED FOR CHEST PAIN. ?  ?torsemide 20 MG tablet ?Commonly known as: DEMADEX ?Take 3 tablets (60 mg total) by mouth daily. ?What changed:  ?how much to take ?when to take this ?  ?Trelegy Ellipta 100-62.5-25 MCG/ACT Aepb ?Generic drug: Fluticasone-Umeclidin-Vilant ?Inhale 1 puff into the lungs daily. ?  ?VITAMIN B 12 PO ?Take 1 tablet by mouth daily. ?  ? ?  ? ?  ?  ? ? ?  ?Durable Medical Equipment  ?(From admission, onward)  ?  ? ? ?  ? ?  Start     Ordered  ? 04/19/22 1054  For home use only DME oxygen  Once       ?Question Answer Comment  ?Length of Need 12 Months   ?Mode or (Route) Nasal cannula   ?Liters per Minute 2   ?Frequency Continuous (stationary and portable oxygen unit needed)   ?Oxygen conserving device Yes   ?Oxygen delivery system Gas   ?  ?  04/19/22 1053  ? ?  ?  ? ?  ? ? ?Discharge Exam: ?Filed Weights  ? 04/16/22 0500 04/17/22 0300 04/18/22 0435  ?Weight: 82.8 kg 82 kg 82.1 kg  ? ?BP 128/65 (BP Location: Right Arm)   Pulse 62   Temp 97.6 ?F (36.4 ?C)

## 2022-04-18 NOTE — Progress Notes (Signed)
Hypotension event ? ?BP dropped down to 91/41 map of 58. NS bolus 23ml x1 infusing. Dr. Cathlean Sauer at the bedside.  ?

## 2022-04-18 NOTE — Evaluation (Signed)
Physical Therapy Evaluation ?Patient Details ?Name: Joe Wiggins ?MRN: [id number] ?DOB: [birth year] ?Today's Date: 04/18/2022 ? ?History of Present Illness ? The pt is a 76 yo male presenting 4/21 with chest pain, SOB, and afib with RVR. PMH includes: COPD, asthma, CAD with NSTEMI and CABG in 02/2022, vascular dementia, CHF, OSA with CPAP use, and CKD IV. ?  ?Clinical Impression ? Pt in bed upon arrival of PT, agreeable to evaluation at this time. Prior to admission the pt was mobilizing short distances in the home without use of DME, per family, the pt is fairly sedentary at baseline. The pt also relies on assist from his wife for IADLs and some help with ADLs. The pt now presents with limitations in functional mobility, endurance, and dynamic stability due to above dx, and will continue to benefit from skilled PT to address these deficits. The pt was able to complete sit-stand transfers and initial gait without UE support, but needed BUE support on RW for longer gait progression (up to 25 ft) prior to the pt asking for seated rest due to fatigue. No overt LOB during session, and BP and SpO2 stable >89% with ambulation on RA. The pt did have HR increase to 130s prior to mobilizing, his HR then returned to 60-80s with gait, and then jumped back to 140s with seated rest after session. Pt and his wife in agreement with d/c plan of home with OPPT to improve muscular strength, endurance, and activity tolerance.  ? ?SpO2 with single drop to low of 89% on RA with activity, mostly 90-94% with ambulation. Increased work of breathing.  ?   ?   ? ?Recommendations for follow up therapy are one component of a multi-disciplinary discharge planning process, led by the attending physician.  Recommendations may be updated based on patient status, additional functional criteria and insurance authorization. ? ?Follow Up Recommendations Outpatient PT ? ?  ?Assistance Recommended at Discharge Intermittent Supervision/Assistance  ?Patient can  return home with the following ? A little help with walking and/or transfers;A little help with bathing/dressing/bathroom;Assistance with cooking/housework;Assist for transportation ? ?  ?Equipment Recommendations None recommended by PT  ?Recommendations for Other Services ?    ?  ?Functional Status Assessment Patient has had a recent decline in their functional status and demonstrates the ability to make significant improvements in function in a reasonable and predictable amount of time.  ? ?  ?Precautions / Restrictions Precautions ?Precautions: Fall;Other (comment) ?Precaution Comments: watch HR ?Restrictions ?Weight Bearing Restrictions: No  ? ?  ? ?Mobility ? Bed Mobility ?Overal bed mobility: Needs Assistance ?  ?  ?  ?  ?  ?  ?General bed mobility comments: OOB in recliner at start and end of session ?  ? ?Transfers ?Overall transfer level: Needs assistance ?Equipment used: None, Rolling walker (2 wheels) ?Transfers: Sit to/from Stand ?Sit to Stand: Min guard ?  ?  ?  ?  ?  ?General transfer comment: minG with BUE on armrests to rise, able to stabilize in standing without UE support, also able to stabilize with RW ?  ? ?Ambulation/Gait ?Ambulation/Gait assistance: Min guard ?Gait Distance (Feet): 25 Feet (+ 25 ft) ?Assistive device: Rolling walker (2 wheels) ?Gait Pattern/deviations: Step-through pattern ?Gait velocity: decreased ?Gait velocity interpretation: <1.31 ft/sec, indicative of household ambulator ?  ?General Gait Details: VSS on RA (SpO2 89-94%) with 25 ft ambulation, able to complete short distances without DME, RW for improved stability and endurance for longer ambulation. Pt needing to sit due to feeling  dizzy after 25 ft. BP stable ? ?  ? ?Balance Overall balance assessment: Mild deficits observed, not formally tested ?  ?  ?  ?  ?  ?  ?  ?  ?  ?  ?  ?  ?  ?  ?  ?  ?  ?  ?   ? ? ? ?Pertinent Vitals/Pain Pain Assessment ?Pain Assessment: No/denies pain  ? ? ?Home Living Family/patient expects to  be discharged to:: Private residence ?Living Arrangements: Spouse/significant other ?Available Help at Discharge: Family;Available 24 hours/day ?Type of Home: House ?Home Access: Stairs to enter ?Entrance Stairs-Rails: Right;Left ?Entrance Stairs-Number of Steps: 2 ?  ?Home Layout: One level ?Home Equipment: Conservation officer, nature (2 wheels);Cane - single point;BSC/3in1;Shower seat;Wheelchair - Psychologist, educational ?   ?  ?Prior Function Prior Level of Function : Independent/Modified Independent ?  ?  ?  ?  ?  ?  ?Mobility Comments: Independent ambulating without DME. Primarily sedentary, enjoys watching TV. Wife drives. Denies recent falls in past 6 months ?ADLs Comments: wife performs IADLs, wife helps with dressing and washing for time-sake ?  ? ? ?Hand Dominance  ? Dominant Hand: Right ? ?  ?Extremity/Trunk Assessment  ? Upper Extremity Assessment ?Upper Extremity Assessment: Defer to OT evaluation ?  ? ?Lower Extremity Assessment ?Lower Extremity Assessment: Overall WFL for tasks assessed ?  ? ?Cervical / Trunk Assessment ?Cervical / Trunk Assessment: Kyphotic  ?Communication  ? Communication: HOH  ?Cognition Arousal/Alertness: Awake/alert ?Behavior During Therapy: [facility] for tasks assessed/performed ?Overall Cognitive Status: History of cognitive impairments - at baseline ?  ?  ?  ?  ?  ?  ?  ?  ?  ?  ?  ?  ?  ?  ?  ?  ?General Comments: pt with hx of vascular dementia, able to follow simple cues and instructions through session. Per wife, pt can be difficult to encourage at baseline as he is relatively sedentary ?  ?  ? ?  ?General Comments General comments (skin integrity, edema, etc.): SpO2 > 89% on RA, mostly 90-94%. HR to 130s with pt sitting at rest prior to session, in 60-70s during gait, then spiking to 140s when pt sitting at rest after mobility. ? ?  ?Exercises    ? ?Assessment/Plan  ?  ?PT Assessment Patient needs continued PT services  ?PT Problem List Decreased strength;Decreased activity  tolerance;Decreased balance;Decreased mobility;Cardiopulmonary status limiting activity ? ?   ?  ?PT Treatment Interventions DME instruction;Gait training;Stair training;Therapeutic activities;Functional mobility training;Therapeutic exercise;Balance training;Patient/family education   ? ?PT Goals (Current goals can be found in the Care Plan section)  ?Acute Rehab PT Goals ?Patient Stated Goal: return home ?PT Goal Formulation: With patient ?Time For Goal Achievement: 05/02/22 ?Potential to Achieve Goals: Good ? ?  ?Frequency Min 3X/week ?  ? ? ?   ?AM-PAC PT "6 Clicks" Mobility  ?Outcome Measure Help needed turning from your back to your side while in a flat bed without using bedrails?: A Little ?Help needed moving from lying on your back to sitting on the side of a flat bed without using bedrails?: A Little ?Help needed moving to and from a bed to a chair (including a wheelchair)?: A Little ?Help needed standing up from a chair using your arms (e.g., wheelchair or bedside chair)?: A Little ?Help needed to walk in hospital room?: A Little ?Help needed climbing 3-5 steps with a railing? : A Little ?6 Click Score: 18 ? ?  ?End of Session  Equipment Utilized During Treatment: Gait belt ?Activity Tolerance: Patient tolerated treatment well ?Patient left: in chair;with call bell/phone within reach;with family/visitor present ?Nurse Communication: Mobility status ?PT Visit Diagnosis: Other abnormalities of gait and mobility (R26.89);Muscle weakness (generalized) (M62.81) ?  ? ?Time: 4045-9136 ?PT Time Calculation (min) (ACUTE ONLY): 23 min ? ? ?Charges:   PT Evaluation ?$PT Eval Low Complexity: 1 Low ?  ?  ?   ? ? ?West Carbo, PT, DPT  ? ?Acute Rehabilitation Department ?Pager #: [phone] ? ?Sandra Cockayne ?04/18/2022, 12:56 PM ? ?

## 2022-04-18 NOTE — TOC Progression Note (Addendum)
Transition of Care (TOC) - Progression Note  ? ? ?Patient Details  ?Name: Joe Wiggins ?MRN: [id number] ?Date of Birth: February 06, 1946 ? ?Transition of Care (TOC) CM/SW Contact  ?Zenon Mayo, RN ?Phone Number: ?04/18/2022, 12:25 PM ? ?Clinical Narrative:    ?Patient is for dc today, NCM spoke with patient at the bedside.  PT and OT to see patient .  NCM asked patient if he has any HH at home he states no,  offered choice from Medicare.gov list. in case they recommend HH.  Wife and patient does not have a preference of [place] agency. Physical therapy has recommened outpatient physical .  Wife states would like outpatient physical therapy on [address]  NCM made referral thru epic.  Patient is having bp issues right now, may have to cancel dc. Bp is dropping very low, getting a bolus. ? ? ?  ?  ? ?Expected Discharge Plan and Services ?  ?  ?  ?  ?  ?Expected Discharge Date: 04/18/22               ?  ?  ?  ?  ?  ?  ?  ?  ?  ?  ? ? ?Social Determinants of Health (SDOH) Interventions ?  ? ?Readmission Risk Interventions ?   ? View : No data to display.  ?  ?  ?  ? ? ?

## 2022-04-18 NOTE — Discharge Instructions (Addendum)
Low Sodium Nutrition Therapy  ?Eating less sodium can help you if you have high blood pressure, heart failure, or kidney or liver disease.  ? ?Your body needs a little sodium, but too much sodium can cause your body to hold onto extra water. This extra water will raise your blood pressure and can cause damage to your heart, kidneys, or liver as they are forced to work harder.  ? ?Sometimes you can see how the extra fluid affects you because your hands, legs, or belly swell. You may also hold water around your heart and lungs, which makes it hard to breathe.  ? ?Even if you take medication for blood pressure or a water pill (diuretic) to remove fluid, it is still important to have less salt in your diet.  ? ?Check with your primary care provider before drinking alcohol since it may affect the amount of fluid in your body and how your heart, kidneys, or liver work. ?Sodium in Food ?A low-sodium meal plan limits the sodium that you get from food and beverages to 1,500-2,000 milligrams (mg) per day. Salt is the main source of sodium. Read the nutrition label on the package to find out how much sodium is in one serving of a food.  ?Select foods with 140 milligrams (mg) of sodium or less per serving.  ?You may be able to eat one or two servings of foods with a little more than 140 milligrams (mg) of sodium if you are closely watching how much sodium you eat in a day.  ?Check the serving size on the label. The amount of sodium listed on the label shows the amount in one serving of the food. So, if you eat more than one serving, you will get more sodium than the amount listed. ? ?Tips ?Cutting Back on Sodium ?Eat more fresh foods.  ?Fresh fruits and vegetables are low in sodium, as well as frozen vegetables and fruits that have no added juices or sauces.  ?Fresh meats are lower in sodium than processed meats, such as bacon, sausage, and hotdogs.  ?Not all processed foods are unhealthy, but some processed foods may have too  much sodium.  ?Eat less salt at the table and when cooking. One of the ingredients in salt is sodium.  ?One teaspoon of table salt has 2,300 milligrams of sodium.  ?Leave the salt out of recipes for pasta, casseroles, and soups. ?Be a Paramedic.  ?Food packages that say ?Salt-free?, sodium-free?, ?very low sodium,? and ?low sodium? have less than 140 milligrams of sodium per serving.  ?Beware of products identified as ?Unsalted,? ?No Salt Added,? ?Reduced Sodium,? or ?Lower Sodium.? These items may still be high in sodium. You should always check the nutrition label. ?Add flavors to your food without adding sodium.  ?Try lemon juice, lime juice, or vinegar.  ?Dry or fresh herbs add flavor.  ?Buy a sodium-free seasoning blend or make your own at home. ?You can purchase salt-free or sodium-free condiments like barbeque sauce in stores and online. Ask your registered dietitian nutritionist for recommendations and where to find them.  ? ?Eating in Restaurants ?Choose foods carefully when you eat outside your home. Restaurant foods can be very high in sodium. Many restaurants provide nutrition facts on their menus or their websites. If you cannot find that information, ask your server. Let your server know that you want your food to be cooked without salt and that you would like your salad dressing and sauces to be served on the  side.  ? ? ?Foods Recommended ?Food Group Foods Recommended  ?Grains Bread, bagels, rolls without salted tops ?Homemade bread made with reduced-sodium baking powder ?Cold cereals, especially shredded wheat and puffed rice ?Oats, grits, or cream of wheat ?Pastas, quinoa, and rice ?Popcorn, pretzels or crackers without salt ?Corn tortillas  ?Protein Foods Fresh meats and fish; [place] bacon (check the nutrition labels - make sure they are not packaged in a sodium solution) ?Canned or packed tuna (no more than 4 ounces at 1 serving) ?Beans and peas ?Soybeans) and tofu ?Eggs ?Nuts or nut butters  without salt  ?Dairy Milk or milk powder ?Plant milks, such as rice and soy ?Yogurt, including Greek yogurt ?Small amounts of natural cheese (blocks of cheese) or reduced-sodium cheese can be used in moderation. (Swiss, ricotta, and fresh mozzarella cheese are lower in sodium than the others) ?Cream Cheese ?Low sodium cottage cheese  ?Vegetables Fresh and frozen vegetables without added sauces or salt ?Homemade soups (without salt) ?Low-sodium, salt-free or sodium-free canned vegetables and soups  ?Fruit Fresh and canned fruits ?Dried fruits, such as raisins, cranberries, and prunes  ?Oils Tub or liquid margarine, regular or without salt ?Canola, corn, peanut, olive, safflower, or sunflower oils  ?Condiments Fresh or dried herbs such as basil, bay leaf, dill, mustard (dry), nutmeg, paprika, parsley, rosemary, sage, or thyme.  ?Low sodium ketchup ?Vinegar  ?Lemon or lime juice ?Pepper, red pepper flakes, and cayenne. ?Hot sauce contains sodium, but if you use just a drop or two, it will not add up to much.  ?Salt-free or sodium-free seasoning mixes and marinades ?Simple salad dressings: vinegar and oil  ? ?Foods Not Recommended ?Food Group Foods Not Recommended  ?Grains Breads or crackers topped with salt ?Cereals (hot/cold) with more than 300 mg sodium per serving ?Biscuits, cornbread, and other ?quick? breads prepared with baking soda ?Pre-packaged bread crumbs ?Seasoned and packaged rice and pasta mixes ?Self-rising flours  ?Protein Foods Cured meats: Bacon, ham, sausage, pepperoni and hot dogs ?Canned meats (chili, vienna sausage, or sardines) ?Smoked fish and meats ?Frozen meals that have more than 600 mg of sodium per serving ?Egg substitute (with added sodium)  ?Dairy Buttermilk ?Processed cheese spreads ?Cottage cheese (1 cup may have over 500 mg of sodium; look for low-sodium.) ?American or feta cheese ?Shredded Cheese has more sodium than blocks of cheese ?String cheese  ?Vegetables Canned vegetables  (unless they are salt-free, sodium-free or low sodium) ?Frozen vegetables with seasoning and sauces ?Sauerkraut and pickled vegetables ?Canned or dried soups (unless they are salt-free, sodium-free, or low sodium) ?[place] fries and onion rings  ?Fruit Dried fruits preserved with additives that have sodium  ?Oils Salted butter or margarine, all types of olives  ?Condiments Salt, sea salt, kosher salt, onion salt, and garlic salt ?Seasoning mixes with salt ?Bouillon cubes ?Ketchup ?Barbeque sauce and Worcestershire sauce unless low sodium ?Soy sauce ?Salsa, pickles, olives, relish ?Salad dressings: ranch, blue cheese, [place], and [place].  ? ?Low Sodium Sample 1-Day Menu  ?Breakfast 1 cup cooked oatmeal  ?1 slice whole wheat bread toast  ?1 tablespoon peanut butter without salt  ?1 banana  ?1 cup 1% milk  ?Lunch Tacos made with: 2 corn tortillas  ?? cup black beans, low sodium  ?? cup roasted or grilled chicken (without skin)  ?? avocado  ?Squeeze of lime juice  ?1 cup salad greens  ?1 tablespoon low-sodium salad dressing  ?? cup strawberries  ?1 orange  ?Afternoon Snack 1/3 cup grapes  ?6 ounces yogurt  ?  Evening Meal 3 ounces herb-baked fish  ?1 baked potato  ?2 teaspoons olive oil  ?? cup cooked carrots  ?2 thick slices tomatoes on:  ?2 lettuce leaves  ?1 teaspoon olive oil  ?1 teaspoon balsamic vinegar  ?1 cup 1% milk  ?Evening Snack 1 apple  ?? cup almonds without salt  ? ?Low-Sodium Vegetarian (Lacto-Ovo) Sample 1-Day Menu  ?Breakfast 1 cup cooked oatmeal  ?1 slice whole wheat toast  ?1 tablespoon peanut butter without salt  ?1 banana  ?1 cup 1% milk  ?Lunch Tacos made with: 2 corn tortillas  ?? cup black beans, low sodium  ?? cup roasted or grilled chicken (without skin)  ?? avocado  ?Squeeze of lime juice  ?1 cup salad greens  ?1 tablespoon low-sodium salad dressing  ?? cup strawberries  ?1 orange  ?Evening Meal Stir fry made with: ? cup tofu  ?1 cup brown rice  ?? cup broccoli  ?? cup green beans  ?? cup  peppers  ?? tablespoon peanut oil  ?1 orange  ?1 cup 1% milk  ?Evening Snack 4 strips celery  ?2 tablespoons hummus  ?1 hard-boiled egg  ? ?Low-Sodium Vegan Sample 1-Day Menu  ?Breakfast 1 cup cooked oatmeal  ?1

## 2022-04-18 NOTE — Progress Notes (Signed)
Repeat BP increased to 131/63 post bolus. Patient denies any dizziness. No distress noted. In bed rest comfortably. Will continue to monitor patient.    ?

## 2022-04-18 NOTE — Progress Notes (Signed)
Heart Failure Navigator Progress Note ? ?Assessed for Heart & Vascular TOC clinic readiness.  ?Patient does not meet criteria due to no benefit at this time..  ? ? ? ?Novella Abraha, BSN, RN ?Heart Failure Nurse Navigator ?Secure Chat Only   ?

## 2022-04-18 NOTE — Assessment & Plan Note (Signed)
Continue with oral iron supplementation.  

## 2022-04-18 NOTE — Progress Notes (Signed)
Patient had orthostatic hypotension, required NS bolus 250 ml x1 ?Hold on hydralazine and torsemide for today and cancel his discharge ?

## 2022-04-18 NOTE — Progress Notes (Signed)
Initial Nutrition Assessment ? ?DOCUMENTATION CODES:  ? ?Not applicable ? ?INTERVENTION:  ?Provide Ensure Enlive po BID, each supplement provides 350 kcal and 20 grams of protein. ? ?Encourage adequate PO intake.  ? ?Diet handout given and placed in discharge instructions.  ? ?NUTRITION DIAGNOSIS:  ? ?Increased nutrient needs related to chronic illness (COPD, CHF) as evidenced by estimated needs. ? ?GOAL:  ? ?Patient will meet greater than or equal to 90% of their needs ? ?MONITOR:  ? ?PO intake, Supplement acceptance, Labs, Weight trends, Skin, I & O's ? ?REASON FOR ASSESSMENT:  ? ?Consult ?Assessment of nutrition requirement/status, Diet education ? ?ASSESSMENT:  ? ?76 yo male with the past medical history of CAD, paroxysmal atrial fibrillation, COPD, chronic kidney disease stage 4, dementia and heart failure who presented with dyspnea. Pt with working diagnosis of acute on chronic combined systolic and diastolic CHF, decompensated heart failure. ? ?Pt was unavailable during time of visit, busy with nursing cares. Meal completion has been 50-100%. Discharge canceled today due to orthostatic hypotension. Unable to complete Nutrition-Focused physical exam at this time. RD to order nutritional supplements to aid in caloric and protein needs. Diet handout regarding low sodium diet placed in pt discharge instructions. Labs and medications reviewed.  ? ?Diet Order:   ?Diet Order   ? ?       ?  Diet - low sodium heart healthy       ?  ?  Diet Heart Room service appropriate? Yes; Fluid consistency: Thin; Fluid restriction: 1200 mL Fluid  Diet effective now       ?  ? ?  ?  ? ?  ? ? ?EDUCATION NEEDS:  ? ?Education needs have been addressed ? ?Skin:  Skin Assessment: Reviewed RN Assessment ? ?Last BM:  4/23 ? ?Height:  ? ?Ht Readings from Last 1 Encounters:  ?04/15/22 5\' 9"  (1.753 m)  ? ? ?Weight:  ? ?Wt Readings from Last 1 Encounters:  ?04/18/22 82.1 kg  ? ?BMI:  Body mass index is 26.74 kg/m?. ? ?Estimated Nutritional  Needs:  ? ?Kcal:  2000-2200 ? ?Protein:  100-110 grams ? ?Fluid:  1.2 L/day ? ?Corrin Parker, MS, RD, LDN ?RD pager number/after hours weekend pager number on Amion. ? ?

## 2022-04-18 NOTE — Care Management Important Message (Signed)
Important Message ? ?Patient Details  ?Name: Joe Wiggins ?MRN: [id number] ?Date of Birth: 02/23/1946 ? ? ?Medicare Important Message Given:  Yes ? ? ? ? ?Shelda Altes ?04/18/2022, 12:18 PM ?

## 2022-04-18 NOTE — Progress Notes (Signed)
PT sleeping upon entry. Wakes and is alert. PT refusing Cpap at this time. Will continue to monitor.  ?

## 2022-04-19 ENCOUNTER — Ambulatory Visit (HOSPITAL_COMMUNITY): Payer: Medicare HMO

## 2022-04-19 ENCOUNTER — Other Ambulatory Visit (HOSPITAL_COMMUNITY): Payer: Self-pay

## 2022-04-19 DIAGNOSIS — I5043 Acute on chronic combined systolic (congestive) and diastolic (congestive) heart failure: Secondary | ICD-10-CM | POA: Diagnosis not present

## 2022-04-19 LAB — BASIC METABOLIC PANEL
Anion gap: 9 (ref 5–15)
BUN: 41 mg/dL — ABNORMAL HIGH (ref 8–23)
CO2: 22 mmol/L (ref 22–32)
Calcium: 8.9 mg/dL (ref 8.9–10.3)
Chloride: 104 mmol/L (ref 98–111)
Creatinine, Ser: 2.44 mg/dL — ABNORMAL HIGH (ref 0.61–1.24)
GFR, Estimated: 27 mL/min — ABNORMAL LOW (ref >60)
Glucose, Bld: 103 mg/dL — ABNORMAL HIGH (ref 70–99)
Potassium: 3.9 mmol/L (ref 3.5–5.1)
Sodium: 135 mmol/L (ref 135–145)

## 2022-04-19 MED ORDER — ENSURE ENLIVE PO LIQD
237.0000 mL | Freq: Two times a day (BID) | ORAL | 0 refills | Status: AC
  Filled 2022-04-19: qty 14220, 30d supply, fill #0

## 2022-04-19 MED ORDER — TORSEMIDE 20 MG PO TABS
60.0000 mg | ORAL_TABLET | Freq: Every day | ORAL | 0 refills | Status: DC
  Filled 2022-04-19: qty 90, 30d supply, fill #0

## 2022-04-19 NOTE — Progress Notes (Addendum)
Patient is feeling better, no further hypotension, nausea or vomiting. Patient had bowel movement. ? ?BP (!) 166/79 (BP Location: Left Arm)   Pulse 68   Temp 98 ?F (36.7 ?C) (Oral)   Resp 16   Ht 5\' 9"  (1.753 m)   Wt 82.1 kg   SpO2 98%   BMI 26.73 kg/m?  ? ?Patient alert and awake ?ENT with no pallor ?Cardiovascular with S1 and S2 present and rhythmic with no gallops rubs or murmurs ?Respiratory with no rales ?Abdomen not distended ?No lower extremity edema ? ?Plan to follow up as outpatient ?Hold on hydralazine for now. ?Discharge home today.  ? ?I spoke with patient's wife at the bedside, we talked in detail about patient's condition, plan of care and prognosis and all questions were addressed. ?

## 2022-04-19 NOTE — Progress Notes (Signed)
Physical Therapy Treatment ?Patient Details ?Name: Joe Wiggins ?MRN: [id number] ?DOB: 1946-02-10 ?Today's Date: 04/19/2022 ? ? ?History of Present Illness 76 yo male presenting 4/21 with chest pain, SOB, and afib with RVR. PMHx: COPD, asthma, CAD, NSTEMI, CABG, vascular dementia, CHF, OSA with CPAP use, and CKD IV. ? ?  ?PT Comments  ? ? Pt with excellent progression able to walk in hall and perform stairs. Pt with stable BP however drop in SPO2 on RA with activity requiring 2L. Will continue to follow with pt educated for HEP, walking program and home pulse ox use. ? ?Sitting 117/58, HR 74 ?Standing 132/52.HR 70  ?Sitting after stairs 126/61, HR 79  ?  ?Recommendations for follow up therapy are one component of a multi-disciplinary discharge planning process, led by the attending physician.  Recommendations may be updated based on patient status, additional functional criteria and insurance authorization. ? ?Follow Up Recommendations ? Outpatient PT ?  ?  ?Assistance Recommended at Discharge Intermittent Supervision/Assistance  ?Patient can return home with the following A little help with walking and/or transfers;A little help with bathing/dressing/bathroom;Assistance with cooking/housework;Assist for transportation ?  ?Equipment Recommendations ? None recommended by PT  ?  ?Recommendations for Other Services   ? ? ?  ?Precautions / Restrictions Precautions ?Precautions: Fall;Other (comment) ?Precaution Comments: watch BP and SPO2 ?Restrictions ?Weight Bearing Restrictions: No  ?  ? ?Mobility ? Bed Mobility ?  ?  ?  ?  ?  ?  ?  ?General bed mobility comments: OOB in recliner at start and end of session ?  ? ?Transfers ?Overall transfer level: Needs assistance ?  ?Transfers: Sit to/from Stand ?Sit to Stand: Supervision ?  ?  ?  ?  ?  ?General transfer comment: cues for hand placement ?  ? ?Ambulation/Gait ?Ambulation/Gait assistance: Supervision ?Gait Distance (Feet): 160 Feet ?Assistive device: Rolling walker (2  wheels) ?Gait Pattern/deviations: Step-through pattern, Decreased stride length ?  ?Gait velocity interpretation: 1.31 - 2.62 ft/sec, indicative of limited community ambulator ?  ?General Gait Details: 160' x 2 trials. pt with use of RW with initial sats 90% on 1L however with RA and exertion dropped to 87% with lightheadedness and required seated rest and 2L to recover to 94%. Return gait on 2L with sats 91% ? ? ?Stairs ?Stairs: Yes ?Stairs assistance: Modified independent (Device/Increase time) ?Stair Management: Step to pattern, Forwards, One rail Right ?Number of Stairs: 2 ?  ? ? ?Wheelchair Mobility ?  ? ?Modified Rankin (Stroke Patients Only) ?  ? ? ?  ?Balance Overall balance assessment: Mild deficits observed, not formally tested ?  ?  ?  ?  ?  ?  ?  ?  ?  ?  ?  ?  ?  ?  ?  ?  ?  ?  ?  ? ?  ?Cognition Arousal/Alertness: Awake/alert ?Behavior During Therapy: [facility] for tasks assessed/performed ?Overall Cognitive Status: Within Functional Limits for tasks assessed ?  ?  ?  ?  ?  ?  ?  ?  ?  ?  ?  ?  ?  ?  ?  ?  ?  ?  ?  ? ?  ?Exercises General Exercises - Lower Extremity ?Long Arc Quad: AROM, Both, 15 reps, Seated ?Hip Flexion/Marching: AROM, Both, 15 reps, Seated ? ?  ?General Comments   ?  ?  ? ?Pertinent Vitals/Pain Pain Assessment ?Pain Assessment: No/denies pain  ? ? ?Home Living   ?  ?  ?  ?  ?  ?  ?  ?  ?  ?   ?  ?  Prior Function    ?  ?  ?   ? ?PT Goals (current goals can now be found in the care plan section) Progress towards PT goals: Progressing toward goals ? ?  ?Frequency ? ? ? Min 3X/week ? ? ? ?  ?PT Plan Current plan remains appropriate  ? ? ?Co-evaluation   ?  ?  ?  ?  ? ?  ?AM-PAC PT "6 Clicks" Mobility   ?Outcome Measure ? Help needed turning from your back to your side while in a flat bed without using bedrails?: None ?Help needed moving from lying on your back to sitting on the side of a flat bed without using bedrails?: A Little ?Help needed moving to and from a bed to a chair (including a  wheelchair)?: A Little ?Help needed standing up from a chair using your arms (e.g., wheelchair or bedside chair)?: A Little ?Help needed to walk in hospital room?: A Little ?Help needed climbing 3-5 steps with a railing? : A Little ?6 Click Score: 19 ? ?  ?End of Session Equipment Utilized During Treatment: Oxygen ?Activity Tolerance: Patient tolerated treatment well ?Patient left: in chair;with call bell/phone within reach;with family/visitor present ?Nurse Communication: Mobility status ?PT Visit Diagnosis: Other abnormalities of gait and mobility (R26.89);Muscle weakness (generalized) (M62.81) ?  ? ? ?Time: 3570-1779 ?PT Time Calculation (min) (ACUTE ONLY): 26 min ? ?Charges:  $Gait Training: 23-37 mins          ?          ? ?Takahiro Godinho P, PT ?Acute Rehabilitation Services ?Pager: [phone] ?Office: [phone] ? ? ? ?Joe Wiggins ?04/19/2022, 9:54 AM ? ?

## 2022-04-19 NOTE — TOC Transition Note (Signed)
Transition of Care (TOC) - CM/SW Discharge Note ? ? ?Patient Details  ?Name: Miracle Criado ?MRN: [id number] ?Date of Birth: [birth year] ? ?Transition of Care (TOC) CM/SW Contact:  ?Zenon Mayo, RN ?Phone Number: ?04/19/2022, 11:15 AM ? ? ?Clinical Narrative:    ?Patienrt is for dc today, per physical therapy , patient will need home oxygen,  patient has oxygen at home, just needed order from MD and [employer] will bring tank up for patient to go home with.  ? ? ?  ?  ? ? ?Patient Goals and CMS Choice ?  ?  ?  ? ?Discharge Placement ?  ?           ?  ?  ?  ?  ? ?Discharge Plan and Services ?  ?  ?           ?  ?  ?  ?  ?  ?  ?  ?  ?  ?  ? ?Social Determinants of Health (SDOH) Interventions ?  ? ? ?Readmission Risk Interventions ?   ? View : No data to display.  ?  ?  ?  ? ? ? ? ? ?

## 2022-04-19 NOTE — Progress Notes (Signed)
Heart Failure Nurse Navigator Progress Note ? ?PCP: Loura Pardon, MD ?PCP-Cardiologist: Dr. Gardiner Rhyme ?Admission Diagnosis: Acute respiratory failure with hypoxia, Hypertensive emergency ?Admitted from: Home via EMS ? ?Presentation:   ?Joe Wiggins presented with shortness of breath / respiratory distress and chest pain from home via EMS. BP 153/63, Bilateral lower extremity edema, mottling to abdomen, creatinine 2.58, BNP 1,181, placed on Bi pap, Given IV lasix and nitroglycerin.  ? ?Patient was recently admitted last month with NSTEMI,A-fib, chronic combined systolic and diastolic CHF. Wife is at bedside for interview and states he is very noncompliant when it comes to his care, especially his fluids. Education was done with both patient and wife about the importance of his diet/ fluid restrictions, sign and symptoms of heart failure, when to call the doctor or come to the ER, daily weights, and taking all his medications as prescribed, and going to his scheduled appointments. Both verbalized their understanding and agreed to come to a HF [facility] hospital follow up on 04/27/2022 @ 12 noon.  ? ?ECHO/ LVEF: 40-45% ? ?Clinical Course: ? ?Past Medical History:  ?Diagnosis Date  ? Abrasion of forearm, right 07/27/2019  ? Acute respiratory failure ([place])   ? AF (paroxysmal atrial fibrillation) ([place])   ? AICD (automatic cardioverter/defibrillator) present   ? CAD (coronary artery disease)   ? Cardiomyopathy, ischemic   ? Carotid artery occlusion   ? Chronic kidney disease   ? COPD (chronic obstructive pulmonary disease) ([place])   ? Cough 11/13/2019  ? Dementia without behavioral disturbance ([place])   ? Dizziness 01/17/2016  ? Facial rash 08/15/2018  ? GERD (gastroesophageal reflux disease)   ? Hyperlipidemia   ? Hypertension   ? Pre-syncope 01/16/2018  ? Respiratory distress 12/27/2020  ? Sleep apnea   ? Stroke [facility])   ? TIA (transient ischemic attack)   ?  ? ?Social History  ? ?Socioeconomic History  ? Marital status: Married  ?   Spouse name: Joe Wiggins  ? Number of children: 2  ? Years of education: Not on file  ? Highest education level: 11th grade  ?Occupational History  ? 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 Smoking status: Former  ?  Types: Cigarettes  ?  Quit date: 11/30/2012  ?  Years since quitting: 9.3  ? Smokeless tobacco: Never  ?Vaping Use  ? Vaping Use: Never used  ?Substance and Sexual Activity  ? Alcohol use: No  ?  Comment: last drink 10 + years  ? Drug use: No  ? Sexual activity: Not on file  ?Other Topics Concern  ? Not on file  ?Social History Narrative  ? Not on file  ? ?Social Determinants of Health  ? ?Financial Resource Strain: Low Risk   ? Difficulty of Paying Living Expenses: Not hard at all  ?Food Insecurity: No Food Insecurity  ? Worried About Charity fundraiser in the Last Year: Never true  ? Ran Out of Food in the Last Year: Never true  ?Transportation Needs: No Transportation Needs  ? Lack of Transportation (Medical): No  ? Lack of Transportation (Non-Medical): No  ?Physical Activity: Not on file  ?Stress: Not on file  ?Social Connections: Not on file  ? ? ?High Risk Criteria for Readmission and/or Poor Patient Outcomes: ?Heart failure hospital admissions (last 6 months):  1 ?No Show rate: 3 % ?Difficult social situation: No ?Demonstrates medication adherence: Yes, (wife gives him his medications. ) ?Primary Language: English ?Literacy level: Reading,  writing, and comprehension.  ? ?Barriers of Care:   ?Non compliant with fluids. ? ?Considerations/Referrals:  ? ?Referral made to Heart Failure Pharmacist Stewardship:  ?Referral made to Heart Failure CSW/NCM TOC: No ?Referral made to Heart & Vascular TOC clinic: Yes, readmission hospital follow up on 04/27/22 @ 12 noon.  ? ?Items for Follow-up on DC/TOC: ?Optimize medication ?Fluid restrictions ( Non compliant) ?Readmission less then 30 days.  ? ? ?Earnestine Leys, BSN, RN ?Heart Failure Nurse Navigator ?Secure Chat Only   ?

## 2022-04-19 NOTE — Progress Notes (Signed)
SATURATION QUALIFICATIONS: (This note is used to comply with regulatory documentation for home oxygen) ? ?Patient Saturations on Room Air at Rest = 97% ? ?Patient Saturations on Room Air while Ambulating = 87% ? ?Patient Saturations on 2 Liters of oxygen while Ambulating = 91% ? ?Please briefly explain why patient needs home oxygen:Pt with desaturation on RA and requires supplemental oxygen with activity to maintain sats >90% ?Joe Wiggins P, PT ?Acute Rehabilitation Services ?Pager: [phone] ?Office: [phone] ? ?

## 2022-04-21 ENCOUNTER — Ambulatory Visit (HOSPITAL_COMMUNITY): Payer: Medicare HMO

## 2022-04-23 ENCOUNTER — Other Ambulatory Visit (HOSPITAL_COMMUNITY): Payer: Self-pay | Admitting: Nurse Practitioner

## 2022-04-26 ENCOUNTER — Ambulatory Visit (HOSPITAL_COMMUNITY): Payer: Medicare HMO

## 2022-04-27 ENCOUNTER — Other Ambulatory Visit (HOSPITAL_COMMUNITY): Payer: Self-pay | Admitting: Internal Medicine

## 2022-04-27 ENCOUNTER — Ambulatory Visit (HOSPITAL_COMMUNITY)
Admission: RE | Admit: 2022-04-27 | Discharge: 2022-04-27 | Disposition: A | Discharge status: Home / Self Care | Payer: Medicare HMO | Source: Ambulatory Visit | Attending: Internal Medicine | Admitting: Internal Medicine

## 2022-04-27 ENCOUNTER — Encounter (HOSPITAL_COMMUNITY): Payer: Self-pay

## 2022-04-27 ENCOUNTER — Ambulatory Visit (HOSPITAL_COMMUNITY)
Admit: 2022-04-27 | Discharge: 2022-04-27 | Disposition: A | Discharge status: Home / Self Care | Payer: Medicare HMO | Attending: Physician Assistant | Admitting: Physician Assistant

## 2022-04-27 VITALS — BP 134/96 | HR 114 | Wt 184.6 lb

## 2022-04-27 DIAGNOSIS — E785 Hyperlipidemia, unspecified: Secondary | ICD-10-CM | POA: Insufficient documentation

## 2022-04-27 DIAGNOSIS — I252 Old myocardial infarction: Secondary | ICD-10-CM | POA: Diagnosis not present

## 2022-04-27 DIAGNOSIS — N184 Chronic kidney disease, stage 4 (severe): Secondary | ICD-10-CM | POA: Diagnosis not present

## 2022-04-27 DIAGNOSIS — I130 Hypertensive heart and chronic kidney disease with heart failure and stage 1 through stage 4 chronic kidney disease, or unspecified chronic kidney disease: Secondary | ICD-10-CM | POA: Insufficient documentation

## 2022-04-27 DIAGNOSIS — Z91119 Patient's noncompliance with dietary regimen due to unspecified reason: Secondary | ICD-10-CM | POA: Diagnosis not present

## 2022-04-27 DIAGNOSIS — Z951 Presence of aortocoronary bypass graft: Secondary | ICD-10-CM | POA: Insufficient documentation

## 2022-04-27 DIAGNOSIS — I4892 Unspecified atrial flutter: Secondary | ICD-10-CM | POA: Diagnosis not present

## 2022-04-27 DIAGNOSIS — I498 Other specified cardiac arrhythmias: Secondary | ICD-10-CM

## 2022-04-27 DIAGNOSIS — I2510 Atherosclerotic heart disease of native coronary artery without angina pectoris: Secondary | ICD-10-CM | POA: Diagnosis not present

## 2022-04-27 DIAGNOSIS — Z79899 Other long term (current) drug therapy: Secondary | ICD-10-CM | POA: Insufficient documentation

## 2022-04-27 DIAGNOSIS — I255 Ischemic cardiomyopathy: Secondary | ICD-10-CM | POA: Diagnosis not present

## 2022-04-27 DIAGNOSIS — I5022 Chronic systolic (congestive) heart failure: Secondary | ICD-10-CM | POA: Diagnosis not present

## 2022-04-27 DIAGNOSIS — I480 Paroxysmal atrial fibrillation: Secondary | ICD-10-CM | POA: Insufficient documentation

## 2022-04-27 DIAGNOSIS — Z7982 Long term (current) use of aspirin: Secondary | ICD-10-CM | POA: Diagnosis not present

## 2022-04-27 DIAGNOSIS — Z95810 Presence of automatic (implantable) cardiac defibrillator: Secondary | ICD-10-CM | POA: Insufficient documentation

## 2022-04-27 DIAGNOSIS — G4733 Obstructive sleep apnea (adult) (pediatric): Secondary | ICD-10-CM | POA: Insufficient documentation

## 2022-04-27 DIAGNOSIS — Z7901 Long term (current) use of anticoagulants: Secondary | ICD-10-CM | POA: Diagnosis not present

## 2022-04-27 LAB — BRAIN NATRIURETIC PEPTIDE: B Natriuretic Peptide: 603.6 pg/mL — ABNORMAL HIGH (ref 0.0–100.0)

## 2022-04-27 LAB — URIC ACID: Uric Acid, Serum: 6.4 mg/dL (ref 3.7–8.6)

## 2022-04-27 LAB — COMPREHENSIVE METABOLIC PANEL
ALT: 22 U/L (ref 0–44)
AST: 19 U/L (ref 15–41)
Albumin: 3.6 g/dL (ref 3.5–5.0)
Alkaline Phosphatase: 122 U/L (ref 38–126)
Anion gap: 6 (ref 5–15)
BUN: 27 mg/dL — ABNORMAL HIGH (ref 8–23)
CO2: 24 mmol/L (ref 22–32)
Calcium: 9.9 mg/dL (ref 8.9–10.3)
Chloride: 105 mmol/L (ref 98–111)
Creatinine, Ser: 2.46 mg/dL — ABNORMAL HIGH (ref 0.61–1.24)
GFR, Estimated: 26 mL/min — ABNORMAL LOW (ref >60)
Glucose, Bld: 116 mg/dL — ABNORMAL HIGH (ref 70–99)
Potassium: 4.2 mmol/L (ref 3.5–5.1)
Sodium: 135 mmol/L (ref 135–145)
Total Bilirubin: 0.4 mg/dL (ref 0.3–1.2)
Total Protein: 7 g/dL (ref 6.5–8.1)

## 2022-04-27 MED ORDER — HYDRALAZINE HCL 10 MG PO TABS: 10.0000 mg | ORAL_TABLET | Freq: Three times a day (TID) | ORAL | 3 refills | Status: DC

## 2022-04-27 MED ORDER — ISOSORBIDE MONONITRATE ER 30 MG PO TB24: 15.0000 mg | ORAL_TABLET | Freq: Every day | ORAL | 3 refills | Status: DC

## 2022-04-27 NOTE — Progress Notes (Addendum)
? ? ?HEART & VASCULAR TRANSITION OF CARE NOTE  ? ? ? ?Referring Physician: Dr. Cathlean Sauer ?Primary Care: Loura Pardon, MD ?Primary Cardiologist: Donato Heinz, MD ? ? ?HPI: ?Referred to clinic by Dr. Cathlean Sauer, internal medicine, for heart failure consultation.  ? 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 Joelyn Oms, COPD, HTN, HLD and BPH ? ?Last cath here 12/29/17 showed patent grafts. Unusual Y graft to D1, OM and PDA and patent LIMA to LAD.  ? ?Admit 03/23 with NSTEMI. Hs trop 633>>4,100>>2,636. Echo: EF 40-45%, RV normal, mild MR, mild-mod TR. Given Stage IV CKD w/ baseline SCr of 2.7, decision was made to treat NSTEMI medically. He was also volume overloaded. Diuresed w/ IV Lasix. After diuresis, was switched to PO torsemide but dose was reduced from 80 mg bid to 40 mg bid. D/c wt 176 lb.  ? ?Seen in [facility] clinic 03/22/22. Volume status elevated in setting of atrial flutter with rates in 120s. Labetalol switched to carvedilol. Torsemide increased. He was referred to afib clinic. Converted to SR spontaneously. ? ?Readmitted 04/23 for a/c CHF in setting of hypertensive crisis and indiscretion with fluid and sodium intake. Apparently EMS also detected Afib with RVR. Short episode (36 seconds) on device. Subsequent ECGs look like atrial flutter with rates low 100s, does not look like detected by device. He diuresed with IV lasix. Hydralazine added but later held d/t episodes of hypotension.  ? ?Here today for hospital follow-up. Not reporting any significant dyspnea. However, he admits he has not been very active. No lower extremity edema. Denies palpitations. Home weight has been stable around 175-176 lb, clinic weight up a couple of lb from hospital discharge.  His wife reports he has been doing better with fluid and sodium intake but this  is a struggle for him. Planning to start outpatient PT this week. ? ? ? ?Cardiac Testing  ? ?2D Echo 03/08/22 ? 1. Left ventricular ejection fraction, by estimation, is 40 to 45%. The left ventricle has mildly decreased function. The left ventricle demonstrates regional wall motion abnormalities (see scoring diagram/findings for description). Left ventricular diastolic parameters are consistent with Grade I diastolic dysfunction (impaired relaxation).  ? 2. Right ventricular systolic function is normal. The right ventricular size is normal. There is normal pulmonary artery systolic pressure.  ? 3. Left atrial size was mildly dilated.  ? 4. Right atrial size was mildly dilated.  ? 5. The mitral valve is normal in structure. Trivial mitral valve regurgitation.  ? 6. Tricuspid valve regurgitation is mild to moderate.  ? 7. The aortic valve is tricuspid. Aortic valve regurgitation is not visualized.  ? 8. The inferior vena cava is normal in size with greater than 50% respiratory variability, suggesting right atrial pressure of 3 mmHg.  ? ? ?Review of Systems: Cardiac and respiratory. Negative except as mentioned in HPI ? ?Past Medical History:  ?Diagnosis Date  ? Abrasion of forearm, right 07/27/2019  ? Acute respiratory failure ([place])   ? AF (paroxysmal atrial fibrillation) ([place])   ? AICD (automatic cardioverter/defibrillator) present   ? CAD (coronary artery disease)   ? Cardiomyopathy, ischemic   ? Carotid artery occlusion   ? Chronic kidney disease   ? COPD (chronic obstructive pulmonary disease) ([place])   ? Cough 11/13/2019  ? Dementia without behavioral disturbance ([place])   ? Dizziness  01/17/2016  ? Facial rash 08/15/2018  ? GERD (gastroesophageal reflux disease)   ? Hyperlipidemia   ? Hypertension   ? Pre-syncope 01/16/2018  ? Respiratory distress 12/27/2020  ? Sleep apnea   ? Stroke [facility])   ? TIA (transient ischemic attack)   ? ? ?Current Outpatient Medications  ?Medication Sig Dispense Refill  ? albuterol (PROVENTIL  HFA;VENTOLIN HFA) 108 (90 Base) MCG/ACT inhaler Inhale 2 puffs into the lungs every 6 (six) hours as needed for wheezing or shortness of breath.    ? allopurinol (ZYLOPRIM) 100 MG tablet Take 200 mg by mouth daily.    ? aspirin EC 81 MG tablet Take 1 tablet (81 mg total) by mouth daily. Swallow whole. 90 tablet 3  ? atorvastatin (LIPITOR) 40 MG tablet TAKE 1 TABLET EVERY DAY (Patient taking differently: Take 40 mg by mouth daily.) 90 tablet 2  ? carvedilol (COREG) 25 MG tablet TAKE 2 TABLETS(50 MG) BY MOUTH EVERY MORNING AND 1 AND 1/2 TABLETS(37.5 MG) BY MOUTH EVERY EVENING 105 tablet 2  ? cetirizine (ZYRTEC) 10 MG tablet Take 10 mg by mouth daily.    ? cholecalciferol (VITAMIN D) 1000 units tablet Take 1,000 Units by mouth daily.    ? Cyanocobalamin (VITAMIN B 12 PO) Take 1 tablet by mouth daily.    ? ELIQUIS 5 MG TABS tablet TAKE 1 TABLET TWICE DAILY (Patient taking differently: Take 5 mg by mouth 2 (two) times daily.) 180 tablet 1  ? feeding supplement (ENSURE ENLIVE / ENSURE PLUS) LIQD Take 237 mLs by mouth 2 (two) times daily between meals. 14220 mL 0  ? ferrous sulfate 325 (65 FE) MG tablet Take 325 mg by mouth daily with breakfast.    ? folic acid (FOLVITE) 408 MCG tablet Take 400 mcg by mouth daily.    ? labetalol (NORMODYNE) 300 MG tablet Take 300 mg by mouth 2 (two) times daily.    ? Multiple Vitamin (MULTIVITAMIN) tablet Take 1 tablet by mouth daily.    ? nitroGLYCERIN (NITROSTAT) 0.4 MG SL tablet PLACE 1 TABLET (0.4 MG TOTAL) UNDER THE TONGUE EVERY 5 (FIVE) MINUTES AS NEEDED FOR CHEST PAIN. 75 tablet 2  ? torsemide (DEMADEX) 20 MG tablet Take 40 mg by mouth 2 (two) times daily.    ? TRELEGY ELLIPTA 100-62.5-25 MCG/ACT AEPB Inhale 1 puff into the lungs daily.    ? ?No current facility-administered medications for this encounter.  ? ? ?Allergies  ?Allergen Reactions  ? Ipratropium Other (See Comments)  ? Metoprolol   ?  Was bringing blood pressure too low and he would pass out.  ? Morphine And Related  Nausea Only  ? Vicodin [Hydrocodone-Acetaminophen] Other (See Comments)  ?  Hallucinations ?  ? Penicillins Rash  ?  Did it involve swelling of the face/tongue/throat, SOB, or low BP? No ?Did it involve sudden or severe rash/hives, skin peeling, or any reaction on the inside of your mouth or nose? Yes ?Did you need to seek medical attention at a hospital or doctor's office? No ?When did it last happen? Over 7 Years Ago      ?If all above answers are "NO", may proceed with cephalosporin use. ? ?  ? ? ?  ?Social History  ? ?Socioeconomic History  ? Marital status: Married  ?  Spouse name: Montel Vanderhoof  ? Number of children: 2  ? Years of education: Not on file  ? Highest education level: 11th grade  ?Occupational History  ? 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 Smoking status: Former  ?  Types: Cigarettes  ?  Quit date: 11/30/2012  ?  Years since quitting: 9.4  ? Smokeless tobacco: Never  ?Vaping Use  ? Vaping Use: Never used  ?Substance and Sexual Activity  ? Alcohol use: No  ?  Comment: last drink 10 + years  ? Drug use: No  ? Sexual activity: Not on file  ?Other Topics Concern  ? Not on file  ?Social History Narrative  ? Not on file  ? ?Social Determinants of Health  ? ?Financial Resource Strain: Low Risk   ? Difficulty of Paying Living Expenses: Not hard at all  ?Food Insecurity: No Food Insecurity  ? Worried About Charity fundraiser in the Last Year: Never true  ? Ran Out of Food in the Last Year: Never true  ?Transportation Needs: No Transportation Needs  ? Lack of Transportation (Medical): No  ? Lack of Transportation (Non-Medical): No  ?Physical Activity: Not on file  ?Stress: Not on file  ?Social Connections: Not on file  ?Intimate Partner Violence: Not on file  ? ? ?  ?Family History  ?Problem Relation Age of Onset  ? Hypertension Mother   ? Heart disease Mother   ? Hypertension Sister   ? Heart disease Sister   ? Thyroid disease Sister   ? Thyroid disease Brother   ?      hyperthyroid  ? ? ?Vitals:  ? 04/27/22 1208  ?BP: (!) 134/96  ?Pulse: (!) 114  ?SpO2: 96%  ?Weight: 83.7 kg (184 lb 9.6 oz)  ? ? ?PHYSICAL EXAM: ?ReDS 36% ?General:  Well appearing. No resp difficulty. Sitting in w

## 2022-04-27 NOTE — Progress Notes (Signed)
ReDS Vest / Clip - 04/27/22 1300   ? ?  ? ReDS Vest / Clip  ? Station Marker D   ? Ruler Value 33   ? ReDS Value Range Low volume   ? ReDS Actual Value 36   ? Anatomical Comments sitting   ? ?  ?  ? ?  ? ?

## 2022-04-27 NOTE — Patient Instructions (Addendum)
START Hydralazine 10 mg, one tab three times per day ?START Imdur 15 mg (one half tab) daily ?INCREASE Torsemide to 60 mg twice a day for 2 days then resume normal dose of 40 mg twice a day ? ?Labs today ?We will only contact you if something comes back abnormal or we need to make some changes. ?Otherwise no news is good news! ? ?Your physician recommends that you schedule a follow-up appointment in: 2-3 weeks with Dr Gilman Schmidt ? ? ? ?Do the following things EVERYDAY: ?Weigh yourself in the morning before breakfast. Write it down and keep it in a log. ?Take your medicines as prescribed ?Eat low salt foods--Limit salt (sodium) to 2000 mg per day.  ?Stay as active as you can everyday ?Limit all fluids for the day to less than 2 liters ? ? ?

## 2022-04-28 ENCOUNTER — Ambulatory Visit: Payer: Medicare HMO | Attending: Internal Medicine

## 2022-04-28 ENCOUNTER — Ambulatory Visit (HOSPITAL_COMMUNITY): Payer: Medicare HMO

## 2022-04-28 DIAGNOSIS — R262 Difficulty in walking, not elsewhere classified: Secondary | ICD-10-CM | POA: Diagnosis present

## 2022-04-28 DIAGNOSIS — M6281 Muscle weakness (generalized): Secondary | ICD-10-CM | POA: Insufficient documentation

## 2022-04-28 NOTE — Therapy (Addendum)
?OUTPATIENT PHYSICAL THERAPY LOWER EXTREMITY EVALUATION ? ? ?[name] ?MRN: [id number] ?DOB:[birth year], 76 y.o., male ?Today's Date: 04/29/2022 ? ? PT End of Session - 04/29/22 1600   ? ? Visit Number 1   ? Number of Visits 17   ? Date for PT Re-Evaluation 07/01/22   ? Authorization Type [employer] MEDICARE HMO   ? PT Start Time 1100   ? PT Stop Time 1145   ? PT Time Calculation (min) 45 min   ? Equipment Utilized During Treatment Other (comment)   Oxygen  ? Activity Tolerance Patient tolerated treatment well   ? Behavior During Therapy [facility] for tasks assessed/performed   ? ?  ?  ? ?  ? ? ?Past Medical History:  ?Diagnosis Date  ? Abrasion of forearm, right 07/27/2019  ? Acute respiratory failure ([place])   ? AF (paroxysmal atrial fibrillation) ([place])   ? AICD (automatic cardioverter/defibrillator) present   ? CAD (coronary artery disease)   ? Cardiomyopathy, ischemic   ? Carotid artery occlusion   ? Chronic kidney disease   ? COPD (chronic obstructive pulmonary disease) ([place])   ? Cough 11/13/2019  ? Dementia without behavioral disturbance ([place])   ? Dizziness 01/17/2016  ? Facial rash 08/15/2018  ? GERD (gastroesophageal reflux disease)   ? Hyperlipidemia   ? Hypertension   ? Pre-syncope 01/16/2018  ? Respiratory distress 12/27/2020  ? Sleep apnea   ? Stroke [facility])   ? TIA (transient ischemic attack)   ? ?Past Surgical History:  ?Procedure Laterality Date  ? CARDIAC PACEMAKER PLACEMENT    ? CATARACT EXTRACTION    ? CORONARY ARTERY BYPASS GRAFT    ? CORONARY STENT PLACEMENT    ? EYE SURGERY    ? ICD IMPLANT N/A 12/29/2017  ? Procedure: ICD IMPLANT;  Surgeon: Evans Lance, MD;  Location: [place] [facility];  Service: Cardiovascular;  Laterality: N/A;  ? LEFT HEART CATH AND CORS/GRAFTS ANGIOGRAPHY N/A 12/29/2017  ? Procedure: LEFT HEART CATH AND CORS/GRAFTS ANGIOGRAPHY;  Surgeon: Martinique, Peter M, MD;  Location: [place] [facility];  Service: Cardiovascular;  Laterality: N/A;  ? ?Patient Active Problem List  ? Diagnosis Date  Noted  ? Iron deficiency anemia 04/18/2022  ? Lung nodule 03/10/2022  ? Acute on chronic combined systolic and diastolic CHF (congestive heart failure) ([place]) 03/08/2022  ? NSTEMI (non-ST elevated myocardial infarction) ([place]) 03/07/2022  ? Foot pain, right 06/21/2021  ? Chronic systolic heart failure ([place]) 02/16/2021  ? Screen for colon cancer 01/08/2021  ? Hyperthyroidism 01/08/2021  ? Dyspnea 12/27/2020  ? Acute CHF (congestive heart failure) ([place]) 12/27/2020  ? Venous insufficiency 05/27/2020  ? Sinus node dysfunction ([place]) 01/23/2020  ? Gout 01/21/2020  ? Hypertensive crisis 10/30/2019  ? Orthostatic hypotension 03/25/2018  ? Bradycardia 03/25/2018  ? History of cardiac arrest 03/25/2018  ? Vascular dementia without behavioral disturbance ([place]) 03/25/2018  ? Seasonal allergies 03/14/2018  ? ICD (implantable cardioverter-defibrillator) in place   ? Physical deconditioning   ? Cardiac arrest [facility])   ? CAD (coronary artery disease), native coronary artery 01/20/2016  ? Long term current use of anticoagulant therapy 01/20/2016  ? History of TIA (transient ischemic attack) and stroke 01/20/2016  ? Sleep apnea   ? Cardiac pacemaker in situ   ? Anemia 01/17/2016  ? Bilateral carotid artery stenosis   ? Hyperlipidemia   ? Dementia without behavioral disturbance ([place])   ? Hypertensive heart disease without CHF   ? Paroxysmal atrial fibrillation (HCC)   ?  CKD (chronic kidney disease) stage 4, GFR 15-29 ml/min (HCC)   ? COPD (chronic obstructive pulmonary disease) ([place])   ? ? ?PCP: Loura Pardon, MD ? ?REFERRING PROVIDER: Tawni Millers, MD ? ?REFERRING DIAG: Other abnormalities of gait and mobility (R26.89);Muscle weakness (generalized) (M62.81) ? ?THERAPY DIAG:  ?Difficulty in walking, not elsewhere classified ? ?Muscle weakness (generalized) ? ?ONSET DATE: 04/15/22 hosptalization; chronic deconditioned ? ?SUBJECTIVE:  ? ?SUBJECTIVE STATEMENT: ?Pt report being admitted to the hospital 4/21/223 due to  respiratory failure. Pt reports his activity level has been limited for a long time.  ? ?PERTINENT HISTORY: ?Significant Hx of cardiac, respiratory, neurological issues see PMH. Elevated HR controlled by medications, takes eliquis, CPAP ? ?PAIN:  ?Are you having pain? No ? ?PRECAUTIONS: Other: Cardiovascular and respiratory ? ?WEIGHT BEARING RESTRICTIONS No ? ?FALLS:  ?Has patient fallen in last 6 months? No ? ?LIVING ENVIRONMENT: ?Lives with: lives with their family ?Lives in: House/apartment ? ?OCCUPATION: Retired ? ?PLOF: Independent with community mobility without device  without O2. Activity level has been low  ? ?PATIENT GOALS To improve strength and activity tolerance ? ? ?OBJECTIVE:  ? ?DIAGNOSTIC FINDINGS:   ?  ?Chest Xray 04/15/22 ?FINDINGS: ?Prior CABG. Left AICD remains in place, unchanged. Aortic ?calcifications. Heart is normal size. Bilateral lower lobe airspace ?disease, new since prior study. Underlying COPD. No effusions or ?acute bony abnormality. ?  ?IMPRESSION: ?Bilateral lower lobe airspace opacities could reflect edema or ?infection. ?  ?Underlying COPD. ? ?PATIENT SURVEYS:  ?FOTO 46% predicted 53% ? ?COGNITION: ? Overall cognitive status: Within functional limits for tasks assessed   ?  ?SENSATION: ?WFL ? ?POSTURE:  ?Forward flexed trunk ? ?LE MMT: ? ?MMT  Right ?04/29/2022 Left ?04/29/2022  ?Hip flexion 4 3+  ?Hip extension 4 3  ?Hip abduction 3+ 3  ?Hip adduction 4+ 4  ?Hip internal rotation 3+ 3  ?Hip external rotation 3+ 3  ?Knee flexion 5 5  ?Knee extension 5 5  ?Ankle dorsiflexion 4 4  ?Ankle plantarflexion    ?Ankle inversion    ?Ankle eversion    ? (Blank rows = not tested) ? ?LE ROM: ?  WNLs ? ?FUNCTIONAL TESTS:  ?5 times sit to stand: 16.3 with hands on kness ?2 minute walk test: 271ft pre-walk 99%, HR 112; post walk 95% HR 122 pt reports a light exertion level ?  SL stance L 2", R 2" ?GAIT: ?Distance walked: 21ft ?Assistive device utilized: None ?Level of assistance: Complete  Independence ?Comments: Decreased pace O2 at 2L ? ? ? ?TODAY'S TREATMENT: ?Development and instruction of a HEP  ?- Sit to Stand Without Arm Support  10 reps - 3 hold ?- Seated Hip Abduction with Resistance 10 reps - 3 hold ? ?PATIENT EDUCATION:  ?Education details: Eval findings. POC, HEP ?Person educated: Patient and Spouse ?Education method: Explanation, Demonstration, Tactile cues, Verbal cues, and Handouts ?Education comprehension: verbalized understanding, returned demonstration, verbal cues required, and tactile cues required ? ? ?HOME EXERCISE PROGRAM: ?Access Code: [email or web address] ?URL: [email or web address] ?Date: 04/30/2022 ?Prepared by: Gar Ponto ? ?Exercises ?- Sit to Stand Without Arm Support  - 1 x daily - 7 x weekly - 1 sets - 10 reps - 3 hold every other hour ?- Seated Hip Abduction with Resistance  - 2 x daily - 7 x weekly - 2 sets - 10 reps - 3 hold ?- Walk 2-5 mins every other hour as tolerated ? ?ASSESSMENT: ? ?CLINICAL IMPRESSION: ?Patient is a 76 y.o. M who was seen  today for physical therapy evaluation and treatment for Other abnormalities of gait and mobility; Muscle weakness.  ? ? ?OBJECTIVE IMPAIRMENTS decreased activity tolerance, decreased balance, decreased endurance, decreased knowledge of condition, decreased mobility, difficulty walking, decreased ROM, decreased strength, and obesity.  ? ?ACTIVITY LIMITATIONS  community ambulation and activity ? ?PERSONAL FACTORS Age, Fitness, Past/current experiences, Time since onset of injury/illness/exacerbation, and 3+ comorbidities:    Significant Hx of cardiac, respiratory, neurological issues see PMH. Elevated HR controlled by medications, takes eliquis are also affecting patient's functional outcome.  ? ?REHAB POTENTIAL: Fair chronicity of the issue ? ?CLINICAL DECISION MAKING: Evolving/moderate complexity ? ?EVALUATION COMPLEXITY: Moderate ? ? ?GOALS: ? ?SHORT TERM GOALS: Target date: 05/21/2022 ? ?Pt will be Ind in an Initial  HEP ?Baseline: Started on eval ?Goal status: INITIAL ? ? ? ?LONG TERM GOALS: Target date: 07/01/22 ? ?Increase the strength of both legs to 4 to 4+/5 from improved function ?Baseline: see flow sheet ?Goal status: INITIAL ? ?2

## 2022-05-01 NOTE — Therapy (Addendum)
OUTPATIENT PHYSICAL THERAPY TREATMENT NOTE  DISCHARGE   [name] MRN: [id number] DOB:1946-04-12, 76 y.o., male Today's Date: 05/02/2022  PCP:   Loura Pardon, MD   REFERRING PROVIDER: Tawni Millers, MD  END OF SESSION:   PT End of Session - 05/02/22 1112     Visit Number 2    Number of Visits 17    Date for PT Re-Evaluation 07/01/22    Authorization Type [employer] MEDICARE HMO    PT Start Time 1105    PT Stop Time 1145    PT Time Calculation (min) 40 min    Activity Tolerance Patient tolerated treatment well    Behavior During Therapy [facility] for tasks assessed/performed             Past Medical History:  Diagnosis Date   Abrasion of forearm, right 07/27/2019   Acute respiratory failure (HCC)    AF (paroxysmal atrial fibrillation) ([place])    AICD (automatic cardioverter/defibrillator) present    CAD (coronary artery disease)    Cardiomyopathy, ischemic    Carotid artery occlusion    Chronic kidney disease    COPD (chronic obstructive pulmonary disease) ([place])    Cough 11/13/2019   Dementia without behavioral disturbance ([place])    Dizziness 01/17/2016   Facial rash 08/15/2018   GERD (gastroesophageal reflux disease)    Hyperlipidemia    Hypertension    Pre-syncope 01/16/2018   Respiratory distress 12/27/2020   Sleep apnea    Stroke ([place])    TIA (transient ischemic attack)    Past Surgical History:  Procedure Laterality Date   CARDIAC PACEMAKER PLACEMENT     CATARACT EXTRACTION     CORONARY ARTERY BYPASS GRAFT     CORONARY STENT PLACEMENT     EYE SURGERY     ICD IMPLANT N/A 12/29/2017   Procedure: ICD IMPLANT;  Surgeon: Evans Lance, MD;  Location: [facility];  Service: Cardiovascular;  Laterality: N/A;   LEFT HEART CATH AND CORS/GRAFTS ANGIOGRAPHY N/A 12/29/2017   Procedure: LEFT HEART CATH AND CORS/GRAFTS ANGIOGRAPHY;  Surgeon: Martinique, Peter M, MD;  Location: [facility];  Service: Cardiovascular;  Laterality: N/A;   Patient  Active Problem List   Diagnosis Date Noted   Iron deficiency anemia 04/18/2022   Lung nodule 03/10/2022   Acute on chronic combined systolic and diastolic CHF (congestive heart failure) ([place]) 03/08/2022   NSTEMI (non-ST elevated myocardial infarction) ([place]) 03/07/2022   Foot pain, right 35/59/7416   Chronic systolic heart failure ([place]) 02/16/2021   Screen for colon cancer 01/08/2021   Hyperthyroidism 01/08/2021   Dyspnea 12/27/2020   Acute CHF (congestive heart failure) ([place]) 12/27/2020   Venous insufficiency 05/27/2020   Sinus node dysfunction ([place]) 01/23/2020   Gout 01/21/2020   Hypertensive crisis 10/30/2019   Orthostatic hypotension 03/25/2018   Bradycardia 03/25/2018   History of cardiac arrest 03/25/2018   Vascular dementia without behavioral disturbance ([place]) 03/25/2018   Seasonal allergies 03/14/2018   ICD (implantable cardioverter-defibrillator) in place    Physical deconditioning    Cardiac arrest [facility])    CAD (coronary artery disease), native coronary artery 01/20/2016   Long term current use of anticoagulant therapy 01/20/2016   History of TIA (transient ischemic attack) and stroke 01/20/2016   Sleep apnea    Cardiac pacemaker in situ    Anemia 01/17/2016   Bilateral carotid artery stenosis    Hyperlipidemia    Dementia without behavioral disturbance ([place])    Hypertensive heart disease without  CHF    Paroxysmal atrial fibrillation (HCC)    CKD (chronic kidney disease) stage 4, GFR 15-29 ml/min (HCC)    COPD (chronic obstructive pulmonary disease) (HCC)     REFERRING DIAG: Other abnormalities of gait and mobility (R26.89);Muscle weakness (generalized) (M62.81)  THERAPY DIAG:  Difficulty in walking, not elsewhere classified  Muscle weakness (generalized)  PERTINENT HISTORY: MI about 1 month , AICD  PRECAUTIONS: monitor   SUBJECTIVE: This might be the last time I come, financial reasons.  No pain .  Has done the exercises.    PAIN:  Are you having pain?  No   OBJECTIVE: (objective measures completed at initial evaluation unless otherwise dated)  DIAGNOSTIC FINDINGS:     Chest Xray 04/15/22 FINDINGS: Prior CABG. Left AICD remains in place, unchanged. Aortic calcifications. Heart is normal size. Bilateral lower lobe airspace disease, new since prior study. Underlying COPD. No effusions or acute bony abnormality.   IMPRESSION: Bilateral lower lobe airspace opacities could reflect edema or infection.   Underlying COPD.   PATIENT SURVEYS:  FOTO 46% predicted 53%   COGNITION:           Overall cognitive status: Within functional limits for tasks assessed                          SENSATION: WFL   POSTURE:  Forward flexed trunk   LE MMT:   MMT  Right 04/29/2022 Left 04/29/2022  Hip flexion 4 3+  Hip extension 4 3  Hip abduction 3+ 3  Hip adduction 4+ 4  Hip internal rotation 3+ 3  Hip external rotation 3+ 3  Knee flexion 5 5  Knee extension 5 5  Ankle dorsiflexion 4 4  Ankle plantarflexion      Ankle inversion      Ankle eversion       (Blank rows = not tested)   LE ROM:                       WNLs   FUNCTIONAL TESTS:  5 times sit to stand: 16.3 with hands on kness 2 minute walk test: 239f pre-walk 99%, HR 112; post walk 95% HR 122 pt reports a light exertion level                       SL stance L 2", R 2"  GAIT: Distance walked: 2568fAssistive device utilized: None Level of assistance: Complete Independence Comments: Decreased pace O2 at 2L       TODAY'S TREATMENT: Development and instruction of a HEP  - Sit to Stand Without Arm Support  10 reps - 3 hold - Seated Hip Abduction with Resistance 10 reps - 3 hold   [facility] Adult PT Treatment:                                                DATE: 05/02/22 Therapeutic Exercise: Nustep L5 UE and LE for 5 min  Sit to stand x 30 sec, 3 sets added 10 lbs weight Standing march with dumbbell chest press 5 lbs  Biceps curl x 1 min  Heel raise x 15 with 5 lbs  dumbbells Supine deep breathing  Lower trunk rotation  Posterior pelvic tilt with breathing to bridging x 10 x 2  SLR  x 10 each LE  Seated blue band abd/ER x 15  Standing squat with blue band  x10 and chair   Self Care: HR up to 120-129, Sa o2 on 2 L O2  Need to develop habit for daily exercise HEP POC reduced to 1 x per week   .o PATIENT EDUCATION:  Education details: Eval findings. POC, HEP Person educated: Patient and Spouse Education method: Explanation, Demonstration, Tactile cues, Verbal cues, and Handouts Education comprehension: verbalized understanding, returned demonstration, verbal cues required, and tactile cues required     HOME EXERCISE PROGRAM: Access Code: [email or web address] URL: [email or web address] Date: 04/30/2022 Prepared by: Gar Ponto - Walk 2-5 mins every other hour as tolerated  Access Code: [email or web address] URL: [email or web address] Date: 05/02/2022 Prepared by: Raeford Razor  Exercises - Sit to Stand Without Arm Support  - 1 x daily - 7 x weekly - 1 sets - 10 reps - 3 hold - Seated Hip Abduction with Resistance  - 2 x daily - 7 x weekly - 2 sets - 10 reps - 3 hold - Supine Bridge  - 2 x daily - 7 x weekly - 2 sets - 10 reps - 5 hold - Standing Heel Raises  - 2 x daily - 7 x weekly - 2 sets - 10 reps - 5 hold ASSESSMENT:   CLINICAL IMPRESSION: Patient able to exercise with frequent rest breaks.  On 2 L O2, sat and HR well within limits.  Needs cues to slow pace and perform with good form. Tightness in hip flexors evident with bridging.  No pain during session. He would like to come 1 x per week due to financial burden.    OBJECTIVE IMPAIRMENTS decreased activity tolerance, decreased balance, decreased endurance, decreased knowledge of condition, decreased mobility, difficulty walking, decreased ROM, decreased strength, and obesity.    ACTIVITY LIMITATIONS  community ambulation and activity   PERSONAL FACTORS Age, Fitness,  Past/current experiences, Time since onset of injury/illness/exacerbation, and 3+ comorbidities:    Significant Hx of cardiac, respiratory, neurological issues see PMH. Elevated HR controlled by medications, takes eliquis are also affecting patient's functional outcome.    REHAB POTENTIAL: Fair chronicity of the issue   CLINICAL DECISION MAKING: Evolving/moderate complexity   EVALUATION COMPLEXITY: Moderate     GOALS:   SHORT TERM GOALS: Target date: 05/21/2022   Pt will be Ind in an Initial HEP Baseline: Started on eval Goal status: INITIAL       LONG TERM GOALS: Target date: 07/01/22   Increase the strength of both legs to 4 to 4+/5 from improved function Baseline: see flow sheet Goal status: INITIAL   2.  5xSTS will improved to 12 sec c use of hands on LEs as indication of improved function Baseline: 16.3 sec c use of hands on knees Goal status: INITIAL   3.  2MWT distance will increase to 252f as indication of improved function Baseline: 2512fGoal status: INITIAL   4.  SL stance time will increase to 5 sec each as demonstration od improved balance Baseline: 2 sec Goal status: INITIAL   5.  Pt will be ind in a final HEP to maintain achieved LOF Baseline:  Goal status: INITIAL   5.  Pt's FOTO score will improved to 53% as indication of improved perceived function Baseline: 45% Goal status: INITIAL     PLAN: PT FREQUENCY: 2x/week change to 1 x 05/02/22   PT DURATION: 8 weeks   PLANNED INTERVENTIONS: Therapeutic exercises, Therapeutic activity, Neuromuscular re-education, Balance training, Gait  training, Patient/Family education, Stair training, Aquatic Therapy, Dry Needling, Electrical stimulation, Cryotherapy, Moist heat, Taping, Ultrasound, Ionotophoresis 49m/ml Dexamethasone, and Manual therapy   PLAN FOR NEXT SESSION: check new HEP, advance as tol.  Monitor HR, sa O2.      Falon Flinchum, PT 05/02/2022, 12:03 PM    JRaeford Razor PT 05/02/22 12:07 PM Phone:  [phone]: [phone]  PHYSICAL THERAPY DISCHARGE SUMMARY  Visits from Start of Care: 2  Current functional level related to goals / functional outcomes: See above , unknown   Remaining deficits: unknown   Education / Equipment: HEP, need for activity    Patient agrees to discharge. Patient goals were not met. Patient is being discharged due to not returning since the last visit.  [name] called and said she was not able to get him to come to therapy   JRaeford Razor PT 06/09/22 9:00 AM Phone: [phone]: [phone]

## 2022-05-02 ENCOUNTER — Ambulatory Visit: Payer: Medicare HMO | Admitting: Physical Therapy

## 2022-05-02 DIAGNOSIS — R262 Difficulty in walking, not elsewhere classified: Secondary | ICD-10-CM

## 2022-05-02 DIAGNOSIS — M6281 Muscle weakness (generalized): Secondary | ICD-10-CM

## 2022-05-03 ENCOUNTER — Ambulatory Visit (HOSPITAL_COMMUNITY): Payer: Medicare HMO

## 2022-05-03 NOTE — Progress Notes (Signed)
Remote ICD transmission.   

## 2022-05-05 ENCOUNTER — Encounter: Payer: Medicare HMO | Admitting: Physical Therapy

## 2022-05-05 ENCOUNTER — Ambulatory Visit (HOSPITAL_COMMUNITY): Payer: Medicare HMO

## 2022-05-06 ENCOUNTER — Other Ambulatory Visit: Payer: Self-pay

## 2022-05-06 MED ORDER — CARVEDILOL 25 MG PO TABS: ORAL_TABLET | ORAL | 1 refills | Status: DC

## 2022-05-06 MED ORDER — ISOSORBIDE MONONITRATE ER 30 MG PO TB24: 15.0000 mg | ORAL_TABLET | Freq: Every day | ORAL | 2 refills | Status: AC

## 2022-05-06 MED ORDER — HYDRALAZINE HCL 10 MG PO TABS: 10.0000 mg | ORAL_TABLET | Freq: Three times a day (TID) | ORAL | 1 refills | Status: AC

## 2022-05-09 NOTE — Progress Notes (Addendum)
Electrophysiology Office Note Date: 05/16/2022  ID:  Joe Wiggins, Joe Wiggins 22-Sep-1946, MRN [id number]  PCP: Loura Pardon, MD Primary Cardiologist: Donato Heinz, MD Electrophysiologist: Cristopher Peru, MD   CC: Routine ICD follow-up  Joe Wiggins is a 76 y.o. male seen today for Cristopher Peru, MD for routine electrophysiology followup.  Since last being seen in our clinic the patient reports doing OK. He gets SOB with mild to moderate exertion. Is on O2 all the time. Denies edema. Not very active overall. Does have dizziness with rapid standing or positional changes. he denies chest pain, palpitations, PND, orthopnea, nausea, vomiting, syncope, edema, weight gain, or early satiety. He has not had ICD shocks.   St. Jude Dual Chamber ICD implanted 2014, gen change 2019 for ICM History of AAD therapy: Yes; previously taken off amio . By chart review appears either malaise or thyroid issues, though "eye issues" also mentioned.   Past Medical History:  Diagnosis Date   Abrasion of forearm, right 07/27/2019   Acute respiratory failure (HCC)    AF (paroxysmal atrial fibrillation) (HCC)    AICD (automatic cardioverter/defibrillator) present    CAD (coronary artery disease)    Cardiomyopathy, ischemic    Carotid artery occlusion    Chronic kidney disease    COPD (chronic obstructive pulmonary disease) ([place])    Cough 11/13/2019   Dementia without behavioral disturbance (HCC)    Dizziness 01/17/2016   Facial rash 08/15/2018   GERD (gastroesophageal reflux disease)    Hyperlipidemia    Hypertension    Pre-syncope 01/16/2018   Respiratory distress 12/27/2020   Sleep apnea    Stroke ([place])    TIA (transient ischemic attack)    Past Surgical History:  Procedure Laterality Date   CARDIAC PACEMAKER PLACEMENT     CATARACT EXTRACTION     CORONARY ARTERY BYPASS GRAFT     CORONARY STENT PLACEMENT     EYE SURGERY     ICD IMPLANT N/A 12/29/2017   Procedure: ICD IMPLANT;  Surgeon: Evans Lance, MD;  Location: [facility];  Service: Cardiovascular;  Laterality: N/A;   LEFT HEART CATH AND CORS/GRAFTS ANGIOGRAPHY N/A 12/29/2017   Procedure: LEFT HEART CATH AND CORS/GRAFTS ANGIOGRAPHY;  Surgeon: Martinique, Peter M, MD;  Location: [facility];  Service: Cardiovascular;  Laterality: N/A;    Current Outpatient Medications  Medication Sig Dispense Refill   albuterol (PROVENTIL HFA;VENTOLIN HFA) 108 (90 Base) MCG/ACT inhaler Inhale 2 puffs into the lungs every 6 (six) hours as needed for wheezing or shortness of breath.     allopurinol (ZYLOPRIM) 100 MG tablet Take 200 mg by mouth daily.     aspirin EC 81 MG tablet Take 1 tablet (81 mg total) by mouth daily. Swallow whole. 90 tablet 3   atorvastatin (LIPITOR) 40 MG tablet TAKE 1 TABLET EVERY DAY 90 tablet 2   carvedilol (COREG) 25 MG tablet TAKE 2 TABLETS(50 MG) BY MOUTH EVERY MORNING AND 1 AND 1/2 TABLETS(37.5 MG) BY MOUTH EVERY EVENING 105 tablet 1   cetirizine (ZYRTEC) 10 MG tablet Take 10 mg by mouth daily.     cholecalciferol (VITAMIN D) 1000 units tablet Take 1,000 Units by mouth daily.     Cyanocobalamin (VITAMIN B 12 PO) Take 1 tablet by mouth daily.     ELIQUIS 5 MG TABS tablet TAKE 1 TABLET TWICE DAILY 180 tablet 1   feeding supplement (ENSURE ENLIVE / ENSURE PLUS) LIQD Take 237 mLs by mouth 2 (two) times daily  between meals. 14220 mL 0   ferrous sulfate 325 (65 FE) MG tablet Take 325 mg by mouth daily with breakfast.     folic acid (FOLVITE) 149 MCG tablet Take 400 mcg by mouth daily.     hydrALAZINE (APRESOLINE) 10 MG tablet Take 1 tablet (10 mg total) by mouth 3 (three) times daily. 90 tablet 1   isosorbide mononitrate (IMDUR) 30 MG 24 hr tablet Take 0.5 tablets (15 mg total) by mouth daily. 45 tablet 2   JARDIANCE 10 MG TABS tablet Take 1 tablet by mouth daily.     Multiple Vitamin (MULTIVITAMIN) tablet Take 1 tablet by mouth daily.     nitroGLYCERIN (NITROSTAT) 0.4 MG SL tablet PLACE 1 TABLET (0.4 MG TOTAL) UNDER  THE TONGUE EVERY 5 (FIVE) MINUTES AS NEEDED FOR CHEST PAIN. 75 tablet 2   torsemide (DEMADEX) 20 MG tablet Take 40 mg by mouth 2 (two) times daily.     TRELEGY ELLIPTA 100-62.5-25 MCG/ACT AEPB Inhale 1 puff into the lungs daily.     No current facility-administered medications for this visit.    Allergies:   Ipratropium, Metoprolol, Morphine and related, Vicodin [hydrocodone-acetaminophen], and Penicillins   Social History: Social History   Socioeconomic History   Marital status: Married    Spouse name: Valeriano Bain   Number of children: 2   Years of education: Not on file   Highest education level: 11th grade  Occupational History   Occupation: Retired    Comment: Financial planner  Tobacco Use   Smoking status: Former    Types: Cigarettes    Quit date: 11/30/2012    Years since quitting: 9.4   Smokeless tobacco: Never  Vaping Use   Vaping Use: Never used  Substance and Sexual Activity   Alcohol use: No    Comment: last drink 10 + years   Drug use: No   Sexual activity: Not on file  Other Topics Concern   Not on file  Social History Narrative   Not on file   Social Determinants of Health   Financial Resource Strain: Low Risk    Difficulty of Paying Living Expenses: Not hard at all  Food Insecurity: No Food Insecurity   Worried About Charity fundraiser in the Last Year: Never true   [place] in the Last Year: Never true  Transportation Needs: No Transportation Needs   Lack of Transportation (Medical): No   Lack of Transportation (Non-Medical): No  Physical Activity: Not on file  Stress: Not on file  Social Connections: Not on file  Intimate Partner Violence: Not on file    Family History: Family History  Problem Relation Age of Onset   Hypertension Mother    Heart disease Mother    Hypertension Sister    Heart disease Sister    Thyroid disease Sister    Thyroid disease Brother        hyperthyroid    Review of Systems: All other systems  reviewed and are otherwise negative except as noted above.   Physical Exam: Vitals:   05/16/22 0810  BP: 118/68  Pulse: 75  SpO2: 98%  Weight: 183 lb (83 kg)  Height: 5\' 9"  (1.753 m)     GEN- The patient is well appearing, alert and oriented x 3 today.   HEENT: normocephalic, atraumatic; sclera clear, conjunctiva pink; hearing intact; oropharynx clear; neck supple, no JVP Lymph- no cervical lymphadenopathy Lungs- Clear to ausculation bilaterally, normal work of breathing.  No wheezes, rales,  rhonchi Heart- Regular rate and rhythm, no murmurs, rubs or gallops, PMI not laterally displaced GI- soft, non-tender, non-distended, bowel sounds present, no hepatosplenomegaly Extremities- no clubbing or cyanosis. No edema; DP/PT/radial pulses 2+ bilaterally MS- no significant deformity or atrophy Skin- warm and dry, no rash or lesion; ICD pocket well healed Psych- euthymic mood, full affect Neuro- strength and sensation are intact  ICD interrogation- reviewed in detail today,  See PACEART report  EKG:  EKG is not ordered today.  Recent Labs: 03/07/2022: TSH 3.023 04/15/2022: Hemoglobin 10.9; Platelets 286 04/17/2022: Magnesium 2.5 04/27/2022: ALT 22; B Natriuretic Peptide 603.6; BUN 27; Creatinine, Ser 2.46; Potassium 4.2; Sodium 135   Wt Readings from Last 3 Encounters:  05/16/22 183 lb (83 kg)  04/27/22 184 lb 9.6 oz (83.7 kg)  04/19/22 181 lb (82.1 kg)     Other studies Reviewed: Additional studies/ records that were reviewed today include: Previous EP office notes.   Assessment and Plan:  1.  Chronic systolic CHF s/p St. Jude dual chamber ICD  euvolemic today Stable on an appropriate medical regimen Normal ICD function See Pace Art report No changes today  2. H/o VT/VF Has remained quiescent  3. Paroxysmal atrial fibrillation / atrial tach Burden cannot be tracked effectively due to AT in 110-120s, but overall histograms look OK.  He has had several 10-15s runs of  AT/AF Monitor ordered by HF clinic was only worn for 8 hours, pt reports he is being send a second one.  Continue eliquis Overall low burden and do not think this is currently contributing to his overall picture.  He has previously been on amiodarone, but likely will try and avoid for now moving forward given lung disease on O2  Current medicines are reviewed at length with the patient today.    Disposition:   Follow up with Dr. Lovena Le in 6 months   Signed, Annamaria Helling  05/16/2022 8:34 AM  [facility] [address] [place] [address] [phone] (office) [phone] (fax)

## 2022-05-10 ENCOUNTER — Ambulatory Visit: Payer: Medicare HMO

## 2022-05-10 ENCOUNTER — Ambulatory Visit (HOSPITAL_COMMUNITY): Payer: Medicare HMO

## 2022-05-12 ENCOUNTER — Ambulatory Visit (HOSPITAL_COMMUNITY): Payer: Medicare HMO

## 2022-05-12 ENCOUNTER — Ambulatory Visit: Payer: Medicare HMO | Admitting: Physical Therapy

## 2022-05-13 ENCOUNTER — Telehealth (HOSPITAL_COMMUNITY): Payer: Self-pay | Admitting: Surgery

## 2022-05-13 NOTE — Telephone Encounter (Signed)
I called patient to inquire about the Zio monitor.   He sent back and it only recorded 8 hours of information.  I left a message to request that he call back to clarify if the monitor fell off or if he had an issue.  I will also email Zio representative.

## 2022-05-16 ENCOUNTER — Encounter: Payer: Self-pay | Admitting: Student

## 2022-05-16 ENCOUNTER — Ambulatory Visit (INDEPENDENT_AMBULATORY_CARE_PROVIDER_SITE_OTHER): Payer: Medicare HMO | Admitting: Student

## 2022-05-16 VITALS — BP 118/68 | HR 75 | Ht 69.0 in | Wt 183.0 lb

## 2022-05-16 DIAGNOSIS — Z95810 Presence of automatic (implantable) cardiac defibrillator: Secondary | ICD-10-CM

## 2022-05-16 DIAGNOSIS — I4892 Unspecified atrial flutter: Secondary | ICD-10-CM

## 2022-05-16 DIAGNOSIS — I5022 Chronic systolic (congestive) heart failure: Secondary | ICD-10-CM | POA: Diagnosis not present

## 2022-05-16 LAB — CUP PACEART INCLINIC DEVICE CHECK
Battery Remaining Longevity: 45 mo
Brady Statistic RA Percent Paced: 21 %
Brady Statistic RV Percent Paced: 0.27 %
Date Time Interrogation Session: 20230522083539
HighPow Impedance: 68.625
Implantable Lead Implant Date: 20140217
Implantable Lead Implant Date: 20190104
Implantable Lead Location: 753859
Implantable Lead Location: 753860
Implantable Lead Model: 7122
Implantable Pulse Generator Implant Date: 20190104
Lead Channel Impedance Value: 325 Ohm
Lead Channel Impedance Value: 587.5 Ohm
Lead Channel Pacing Threshold Amplitude: 1 V
Lead Channel Pacing Threshold Amplitude: 1 V
Lead Channel Pacing Threshold Amplitude: 1.25 V
Lead Channel Pacing Threshold Amplitude: 1.25 V
Lead Channel Pacing Threshold Pulse Width: 0.5 ms
Lead Channel Pacing Threshold Pulse Width: 0.5 ms
Lead Channel Pacing Threshold Pulse Width: 0.8 ms
Lead Channel Pacing Threshold Pulse Width: 0.8 ms
Lead Channel Sensing Intrinsic Amplitude: 0.9 mV
Lead Channel Sensing Intrinsic Amplitude: 12 mV
Lead Channel Setting Pacing Amplitude: 2.5 V
Lead Channel Setting Pacing Amplitude: 2.5 V
Lead Channel Setting Pacing Pulse Width: 0.5 ms
Lead Channel Setting Sensing Sensitivity: 0.5 mV
Pulse Gen Serial Number: 9777027

## 2022-05-16 NOTE — Patient Instructions (Signed)
Medication Instructions:  ?Your physician recommends that you continue on your current medications as directed. Please refer to the Current Medication list given to you today. ? ?*If you need a refill on your cardiac medications before your next appointment, please call your pharmacy* ? ? ?Lab Work: ?None ?If you have labs (blood work) drawn today and your tests are completely normal, you will receive your results only by: ?MyChart Message (if you have MyChart) OR ?A paper copy in the mail ?If you have any lab test that is abnormal or we need to change your treatment, we will call you to review the results. ? ? ?Follow-Up: ?At [facility], you and your health needs are our priority.  As part of our continuing mission to provide you with exceptional heart care, we have created designated Provider Care Teams.  These Care Teams include your primary Cardiologist (physician) and Advanced Practice Providers (APPs -  Physician Assistants and Nurse Practitioners) who all work together to provide you with the care you need, when you need it. ? ?We recommend signing up for the patient portal called "MyChart".  Sign up information is provided on this After Visit Summary.  MyChart is used to connect with patients for Virtual Visits (Telemedicine).  Patients are able to view lab/test results, encounter notes, upcoming appointments, etc.  Non-urgent messages can be sent to your provider as well.   ?To learn more about what you can do with MyChart, go to [email or web address].   ? ?Your next appointment:   ?6 month(s) ? ?The format for your next appointment:   ?In Person ? ?Provider:   ?Gregg Taylor, MD{ ?  ?

## 2022-05-17 ENCOUNTER — Ambulatory Visit (HOSPITAL_COMMUNITY): Payer: Medicare HMO

## 2022-05-17 ENCOUNTER — Encounter: Payer: Medicare HMO | Admitting: Physical Therapy

## 2022-05-19 ENCOUNTER — Ambulatory Visit: Payer: Medicare HMO | Admitting: Physical Therapy

## 2022-05-19 ENCOUNTER — Ambulatory Visit (HOSPITAL_COMMUNITY): Payer: Medicare HMO

## 2022-05-22 NOTE — Progress Notes (Unsigned)
Cardiology Office Note:    Date:  05/24/2022   ID:  [facility], DOB 02/02/1946, MRN [id number]  PCP:  Loura Pardon, MD   [facility] Providers Cardiologist:  Donato Heinz, MD Cardiology APP:  Ledora Bottcher, [place]  Electrophysiologist:  Cristopher Peru, MD {  Referring MD: Loura Pardon, MD   Chief Complaint  Patient presents with   Hospitalization Follow-up    CHF, CKD    History of Present Illness:    Joe W[facility]ins is a 76 y.o. male with a hx of chronic respiratory failure on home O2, PAF, CAD, ischemic cardiomyopathy, ICD in place, CKD stage IV, COPD, dementia, GERD, hypertension, hyperlipidemia, history of CVA.  He has a history of multivessel disease status post CABG x4 in 2014 at [facility].  Heart catheterization [facility] on 419 showed patent grafts.  Noted unusual Y graft to D1, OM and PDA, and patent LIMA to LAD.  He is anticoagulated with Eliquis for PAF.  He follows with [facility] with a baseline creatinine around 2.5.  He was admitted March 2023 with chest pain and elevated troponin.  Given his renal function, decision was made to treat NSTEMI medically with 48 hrs heparin.   Echocardiogram that admission showed an LVEF of 40 to 45%, regional wall motion abnormality, grade 1 diastolic dysfunction, normal RV, and mild to moderate TR. OptiVol did not show fluid overload on the preceding 9 days. He was diuresed and discharged on 40 mg torsemide BID. He was in sinus rhythm, At follow up in AHF clinic, he was back in rapid flutter and labetalol was changed to coreg. He continued to struggle with hypervolemia and has been eating high sodium foods. H was seen in Afib clinic 03/29/22 after BB was increased to 50 mg coreg in the AM and 37.5 mg PM. ON 03/29/22, he was back in sinus rhythm and cardioversion was canceled. Afib clinic 04/13/22 still in sinus rhythm, but continued to struggle with fluid balance. He was on 40 mg torsemide BID with extra 20 mg PRN for weight gain.   He  was hospitalized 4/21 - 04/18/2022 with acute on chronic combined heart failure and acute on chronic respiratory failure.  He was diuresed and discharged on increased dose of torsemide 60 mg daily.  He was hypotensive on 50 mg hydralazine twice daily and was discharged without this.  No SGLT2i, ACE, ARB, ARNI due to renal function. He was seen in AHF clinic 04/27/22 with ReDS 36%. He was increased to 60 mg torsemide BID x 2 days then back to 40 mg BID. Imdur 15 mg and 10 mg hydralazine TID were also added. Device showed < 1% AT/AF burden with last episode detected on 04/15/22.   Not currently on amiodarone due to eye issues.   He presents today for general cardiology follow up.   Current regimen includes: 10 mg hydralazine TID 50 mg every morning and 37.5 mg every afternoon carvedilol 15 mg Imdur 40 mg torsemide twice daily  40 mg Lipitor 81 mg aspirin 5 mg Eliquis twice daily  He presents with his wife. No further chest pain. They are very good about managing his fluid status with PRN extra 20 mg torsemide. He requires extra torsemide about once weekly.   Labs by [facility] 04/07/22: sCr 2.66 K 4.4 Hb 10.6  Currently wearing a heart monitor - has worn for 1 week. Overall, doing well but not walking. He walks down the hall in the house once per day. He stays in  the car for errands. Denies depression and unsteadiness on feet. He just doesn't want to walk. Had a discussion about increasing physical activity.   Past Medical History:  Diagnosis Date   Abrasion of forearm, right 07/27/2019   Acute respiratory failure (HCC)    AF (paroxysmal atrial fibrillation) (HCC)    AICD (automatic cardioverter/defibrillator) present    CAD (coronary artery disease)    Cardiomyopathy, ischemic    Carotid artery occlusion    Chronic Wiggins disease    COPD (chronic obstructive pulmonary disease) ([place])    Cough 11/13/2019   Dementia without behavioral disturbance (HCC)    Dizziness 01/17/2016   Facial  rash 08/15/2018   GERD (gastroesophageal reflux disease)    Hyperlipidemia    Hypertension    Pre-syncope 01/16/2018   Respiratory distress 12/27/2020   Sleep apnea    Stroke ([facility])    TIA (transient ischemic attack)     Past Surgical History:  Procedure Laterality Date   CARDIAC PACEMAKER PLACEMENT     CATARACT EXTRACTION     CORONARY ARTERY BYPASS GRAFT     CORONARY STENT PLACEMENT     EYE SURGERY     ICD IMPLANT N/A 12/29/2017   Procedure: ICD IMPLANT;  Surgeon: Evans Lance, MD;  Location: [place] [facility];  Service: Cardiovascular;  Laterality: N/A;   LEFT HEART CATH AND CORS/GRAFTS ANGIOGRAPHY N/A 12/29/2017   Procedure: LEFT HEART CATH AND CORS/GRAFTS ANGIOGRAPHY;  Surgeon: Martinique, Peter M, MD;  Location: [place] [facility];  Service: Cardiovascular;  Laterality: N/A;    Current Medications: Current Meds  Medication Sig   albuterol (PROVENTIL HFA;VENTOLIN HFA) 108 (90 Base) MCG/ACT inhaler Inhale 2 puffs into the lungs every 6 (six) hours as needed for wheezing or shortness of breath.   allopurinol (ZYLOPRIM) 100 MG tablet Take 200 mg by mouth daily.   aspirin EC 81 MG tablet Take 1 tablet (81 mg total) by mouth daily. Swallow whole.   atorvastatin (LIPITOR) 40 MG tablet TAKE 1 TABLET EVERY DAY   carvedilol (COREG) 25 MG tablet TAKE 2 TABLETS(50 MG) BY MOUTH EVERY MORNING AND 1 AND 1/2 TABLETS(37.5 MG) BY MOUTH EVERY EVENING   cetirizine (ZYRTEC) 10 MG tablet Take 10 mg by mouth daily.   cholecalciferol (VITAMIN D) 1000 units tablet Take 1,000 Units by mouth daily.   Cyanocobalamin (VITAMIN B 12 PO) Take 1 tablet by mouth daily.   ELIQUIS 5 MG TABS tablet TAKE 1 TABLET TWICE DAILY   ferrous sulfate 325 (65 FE) MG tablet Take 325 mg by mouth daily with breakfast.   folic acid (FOLVITE) 322 MCG tablet Take 400 mcg by mouth daily.   hydrALAZINE (APRESOLINE) 10 MG tablet Take 1 tablet (10 mg total) by mouth 3 (three) times daily.   isosorbide mononitrate (IMDUR) 30 MG 24 hr  tablet Take 0.5 tablets (15 mg total) by mouth daily.   JARDIANCE 10 MG TABS tablet Take 1 tablet by mouth daily.   Multiple Vitamin (MULTIVITAMIN) tablet Take 1 tablet by mouth daily.   nitroGLYCERIN (NITROSTAT) 0.4 MG SL tablet PLACE 1 TABLET (0.4 MG TOTAL) UNDER THE TONGUE EVERY 5 (FIVE) MINUTES AS NEEDED FOR CHEST PAIN.   torsemide (DEMADEX) 20 MG tablet Take 40 mg by mouth 2 (two) times daily.   TRELEGY ELLIPTA 100-62.5-25 MCG/ACT AEPB Inhale 1 puff into the lungs daily.     Allergies:   Ipratropium, Metoprolol, Morphine and related, Vicodin [hydrocodone-acetaminophen], and Penicillins   Social History   Socioeconomic History  Marital status: Married    Spouse name: Sedrick Tober   Number of children: 2   Years of education: Not on file   Highest education level: 11th grade  Occupational History   Occupation: Retired    Comment: Financial planner  Tobacco Use   Smoking status: Former    Types: Cigarettes    Quit date: 11/30/2012    Years since quitting: 9.4   Smokeless tobacco: Never  Vaping Use   Vaping Use: Never used  Substance and Sexual Activity   Alcohol use: No    Comment: last drink 10 + years   Drug use: No   Sexual activity: Not on file  Other Topics Concern   Not on file  Social History Narrative   Not on file   Social Determinants of Health   Financial Resource Strain: Low Risk    Difficulty of Paying Living Expenses: Not hard at all  Food Insecurity: No Food Insecurity   Worried About Charity fundraiser in the Last Year: Never true   [place] in the Last Year: Never true  Transportation Needs: No Transportation Needs   Lack of Transportation (Medical): No   Lack of Transportation (Non-Medical): No  Physical Activity: Not on file  Stress: Not on file  Social Connections: Not on file     Family History: The patient's family history includes Heart disease in his mother and sister; Hypertension in his mother and sister; Thyroid disease in  his brother and sister.  ROS:   Please see the history of present illness.     All other systems reviewed and are negative.  EKGs/Labs/Other Studies Reviewed:    The following studies were reviewed today:  Echo 03/08/22:  1. Left ventricular ejection fraction, by estimation, is 40 to 45%. The  left ventricle has mildly decreased function. The left ventricle  demonstrates regional wall motion abnormalities (see scoring  diagram/findings for description). Left ventricular  diastolic parameters are consistent with Grade I diastolic dysfunction  (impaired relaxation).   2. Right ventricular systolic function is normal. The right ventricular  size is normal. There is normal pulmonary artery systolic pressure.   3. Left atrial size was mildly dilated.   4. Right atrial size was mildly dilated.   5. The mitral valve is normal in structure. Trivial mitral valve  regurgitation.   6. Tricuspid valve regurgitation is mild to moderate.   7. The aortic valve is tricuspid. Aortic valve regurgitation is not  visualized.   8. The inferior vena cava is normal in size with greater than 50%  respiratory variability, suggesting right atrial pressure of 3 mmHg.   EKG:  EKG is not ordered today.   Recent Labs: 03/07/2022: TSH 3.023 04/15/2022: Hemoglobin 10.9; Platelets 286 04/17/2022: Magnesium 2.5 04/27/2022: ALT 22; B Natriuretic Peptide 603.6; BUN 27; Creatinine, Ser 2.46; Potassium 4.2; Sodium 135  Recent Lipid Panel    Component Value Date/Time   CHOL 164 04/17/2022 0354   CHOL 104 01/06/2021 1203   TRIG 156 (H) 04/17/2022 0354   HDL 28 (L) 04/17/2022 0354   HDL 54 01/06/2021 1203   CHOLHDL 5.9 04/17/2022 0354   VLDL 31 04/17/2022 0354   LDLCALC 105 (H) 04/17/2022 0354   LDLCALC 36 01/06/2021 1203     Risk Assessment/Calculations:    CHA2DS2-VASc Score = 5   This indicates a 7.2% annual risk of stroke. The patient's score is based upon: CHF History: 1 HTN History: 1 Diabetes  History: 0 Stroke  History: 0 Vascular Disease History: 1 Age Score: 2 Gender Score: 0          Physical Exam:    VS:  BP 122/60 (BP Location: Left Arm, Patient Position: Sitting, Cuff Size: Normal)   Pulse 72   Ht 5\' 9"  (1.753 m)   Wt 186 lb 3.2 oz (84.5 kg)   SpO2 98%   BMI 27.50 kg/m     Wt Readings from Last 3 Encounters:  05/24/22 186 lb 3.2 oz (84.5 kg)  05/16/22 183 lb (83 kg)  04/27/22 184 lb 9.6 oz (83.7 kg)     GEN:  Well nourished, well developed in no acute distress HEENT: Normal NECK: No JVD; No carotid bruits LYMPHATICS: No lymphadenopathy CARDIAC: irregular rhythm, regular rate RESPIRATORY:  Clear to auscultation without rales, wheezing or rhonchi [place] in place ABDOMEN: Soft, non-tender, non-distended MUSCULOSKELETAL:  No edema; No deformity  SKIN: Warm and dry NEUROLOGIC:  Alert and oriented x 3 PSYCHIATRIC:  Normal affect   ASSESSMENT:    1. Chronic systolic heart failure (HCC)   2. Cardiac defibrillator in situ   3. Ischemic cardiomyopathy   4. Essential hypertension   5. CKD (chronic Wiggins disease) stage 4, GFR 15-29 ml/min (HCC)   6. Coronary artery disease involving native coronary artery of native heart without angina pectoris   7. Hyperlipidemia, unspecified hyperlipidemia type   8. Atrial flutter, unspecified type ([place])   9. Chronic anticoagulation    PLAN:    In order of problems listed above:  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 cardiomyopathy Hypertension CKD stage IV Recent echo with an LVEF of 40 to 45% and normal RV GDMT limited by renal function Carvedilol 50 / 37.5 mg, unable to titrate metoprolol 10 mg hydralazine TID 15 mg imdur [place] [facility] started jardiance- he is doing well Remains on 40 mg torsemide BID with PRN 20 mg torsemide about once per week Appears fairly euvolemic today Had a long discussion regarding increasing walking. Grandson plays ball at [employer] in  [place].   CAD Hyperlipidemia with LDL goal less than 70 S/p CABG x4 [facility] Last LHC 2019 with patent grafts NSTEMI 3/23 with troponins peaked at 4100 -treated medically Continue aspirin, statin, beta-blocker No further chest pain.  Intolerant to metoprolol.   PAF/flutter Chronic anticoagulation Doing well on increased dose of carvedilol Anticoagulated with Eliquis, no bleeding issues Heart monitor in place If significant burden, refer to EP   Follow up in 5-6 weeks with Dr. Gardiner Rhyme.     Cardiac Rehabilitation Eligibility Assessment  The patient is ready to start cardiac rehabilitation from a cardiac standpoint.          Medication Adjustments/Labs and Tests Ordered: Current medicines are reviewed at length with the patient today.  Concerns regarding medicines are outlined above.  No orders of the defined types were placed in this encounter.  No orders of the defined types were placed in this encounter.   Patient Instructions  Medication Instructions:  No Changes *If you need a refill on your cardiac medications before your next appointment, please call your pharmacy*   Lab Work: No Labs If you have labs (blood work) drawn today and your tests are completely normal, you will receive your results only by: [place] (if you have MyChart) OR A paper copy in the mail If you have any lab test that is abnormal or we need to change your treatment, we will call you to review the results.   Testing/Procedures: No  Testing   Follow-Up: At [facility], you and your health needs are our priority.  As part of our continuing mission to provide you with exceptional heart care, we have created designated Provider Care Teams.  These Care Teams include your primary Cardiologist (physician) and Advanced Practice Providers (APPs -  Physician Assistants and Nurse Practitioners) who all work together to provide you with the care you need, when you need it.  We recommend  signing up for the patient portal called "MyChart".  Sign up information is provided on this After Visit Summary.  MyChart is used to connect with patients for Virtual Visits (Telemedicine).  Patients are able to view lab/test results, encounter notes, upcoming appointments, etc.  Non-urgent messages can be sent to your provider as well.   To learn more about what you can do with MyChart, go to [email or web address].    Your next appointment:   5-6 week(s)  The format for your next appointment:   In Person  Provider:   Donato Heinz, MD       Important Information About Sugar         Signed, Ledora Bottcher, [place]  05/24/2022 2:15 PM    [place]

## 2022-05-24 ENCOUNTER — Encounter: Payer: Self-pay | Admitting: Physician Assistant

## 2022-05-24 ENCOUNTER — Ambulatory Visit (HOSPITAL_COMMUNITY): Payer: Medicare HMO

## 2022-05-24 ENCOUNTER — Ambulatory Visit: Payer: Medicare HMO | Admitting: Physician Assistant

## 2022-05-24 VITALS — BP 122/60 | HR 72 | Ht 69.0 in | Wt 186.2 lb

## 2022-05-24 DIAGNOSIS — I10 Essential (primary) hypertension: Secondary | ICD-10-CM | POA: Diagnosis not present

## 2022-05-24 DIAGNOSIS — I5022 Chronic systolic (congestive) heart failure: Secondary | ICD-10-CM | POA: Diagnosis not present

## 2022-05-24 DIAGNOSIS — Z95810 Presence of automatic (implantable) cardiac defibrillator: Secondary | ICD-10-CM | POA: Diagnosis not present

## 2022-05-24 DIAGNOSIS — I2510 Atherosclerotic heart disease of native coronary artery without angina pectoris: Secondary | ICD-10-CM

## 2022-05-24 DIAGNOSIS — E785 Hyperlipidemia, unspecified: Secondary | ICD-10-CM

## 2022-05-24 DIAGNOSIS — Z7901 Long term (current) use of anticoagulants: Secondary | ICD-10-CM

## 2022-05-24 DIAGNOSIS — I4892 Unspecified atrial flutter: Secondary | ICD-10-CM

## 2022-05-24 DIAGNOSIS — I255 Ischemic cardiomyopathy: Secondary | ICD-10-CM | POA: Diagnosis not present

## 2022-05-24 DIAGNOSIS — N184 Chronic kidney disease, stage 4 (severe): Secondary | ICD-10-CM

## 2022-05-24 NOTE — Patient Instructions (Signed)
Medication Instructions:  No Changes *If you need a refill on your cardiac medications before your next appointment, please call your pharmacy*   Lab Work: No Labs If you have labs (blood work) drawn today and your tests are completely normal, you will receive your results only by: [place] (if you have MyChart) OR A paper copy in the mail If you have any lab test that is abnormal or we need to change your treatment, we will call you to review the results.   Testing/Procedures: No Testing   Follow-Up: At [facility], you and your health needs are our priority.  As part of our continuing mission to provide you with exceptional heart care, we have created designated Provider Care Teams.  These Care Teams include your primary Cardiologist (physician) and Advanced Practice Providers (APPs -  Physician Assistants and Nurse Practitioners) who all work together to provide you with the care you need, when you need it.  We recommend signing up for the patient portal called "MyChart".  Sign up information is provided on this After Visit Summary.  MyChart is used to connect with patients for Virtual Visits (Telemedicine).  Patients are able to view lab/test results, encounter notes, upcoming appointments, etc.  Non-urgent messages can be sent to your provider as well.   To learn more about what you can do with MyChart, go to [email or web address].    Your next appointment:   5-6 week(s)  The format for your next appointment:   In Person  Provider:   Donato Heinz, MD       Important Information About Sugar

## 2022-05-26 ENCOUNTER — Ambulatory Visit (HOSPITAL_COMMUNITY): Payer: Medicare HMO

## 2022-05-26 ENCOUNTER — Ambulatory Visit: Payer: Medicare HMO | Admitting: Physical Therapy

## 2022-05-31 ENCOUNTER — Telehealth (HOSPITAL_COMMUNITY): Payer: Self-pay

## 2022-05-31 ENCOUNTER — Encounter: Payer: Medicare HMO | Admitting: Physical Therapy

## 2022-05-31 ENCOUNTER — Ambulatory Visit (HOSPITAL_COMMUNITY): Payer: Medicare HMO

## 2022-05-31 NOTE — Telephone Encounter (Signed)
Pt insurance is active and benefits verified through [place] $10, DED 0/0 met, out of pocket $3,400/$729.93 met, co-insurance 0%. no pre-authorization required. Passport, 05/31/2022'@10' :48am, REF# [id number]   How many CR sessions are covered? (36 sessions for TCR, 72 sessions for ICR)72 Is this a lifetime maximum or an annual maximum? annual Has the member used any of these services to date? no Is there a time limit (weeks/months) on start of program and/or program completion? no

## 2022-05-31 NOTE — Telephone Encounter (Signed)
Pt wife Lynelle Smoke stated that pt is not interested in the cardiac rehab program. Closed referral.

## 2022-06-02 ENCOUNTER — Ambulatory Visit (HOSPITAL_COMMUNITY): Payer: Medicare HMO

## 2022-06-03 NOTE — Addendum Note (Signed)
Encounter addended by: Micki Riley, RN on: 06/03/2022 3:02 PM  Actions taken: Imaging Exam ended

## 2022-06-04 ENCOUNTER — Other Ambulatory Visit: Payer: Self-pay | Admitting: Internal Medicine

## 2022-06-04 DIAGNOSIS — I480 Paroxysmal atrial fibrillation: Secondary | ICD-10-CM

## 2022-06-06 NOTE — Telephone Encounter (Signed)
Prescription refill request for Eliquis received. Indication:Aflutter Last office visit: 05/24/22 ([facility])  Scr: 2.44 (04/19/22) Age: 76 Weight: 84.5kg  Appropriate dose and refill sent to requested pharmacy.

## 2022-06-07 ENCOUNTER — Ambulatory Visit (HOSPITAL_COMMUNITY): Payer: Medicare HMO

## 2022-06-09 ENCOUNTER — Ambulatory Visit (HOSPITAL_COMMUNITY): Payer: Medicare HMO

## 2022-06-14 ENCOUNTER — Ambulatory Visit (HOSPITAL_COMMUNITY): Payer: Medicare HMO

## 2022-06-16 ENCOUNTER — Ambulatory Visit (HOSPITAL_COMMUNITY): Payer: Medicare HMO

## 2022-07-03 NOTE — Progress Notes (Unsigned)
Cardiology Office Note:    Date:  07/03/2022   ID:  Joe Wiggins, DOB 07/15/1946, MRN [id number]  PCP:  Joe Pardon, MD   [place] Providers Cardiologist:  Joe Heinz, MD Cardiology APP:  Joe Wiggins, [place]  Electrophysiologist:  Joe Peru, MD { Click to update primary MD,subspecialty MD or APP then REFRESH:1}    Referring MD: Joe Pardon, MD   No chief complaint on file. ***  History of Present Illness:    Joe Wiggins is a 76 y.o. male with a hx of chronic respiratory failure on home O2, PAF, CAD, ischemic cardiomyopathy, ICD in place, CKD stage IV, COPD, dementia, GERD, hypertension, hyperlipidemia, history of CVA.  He has a history of multivessel disease status post CABG x 4 in 2014 at [facility].  Heart catheterization [facility] on 419 showed patent grafts.  Noted unusual Y graft to D1, OM and PDA, and patent LIMA to LAD.  He is anticoagulated with Eliquis for PAF.  He follows with [facility] with a baseline creatinine around 2.5.  He was admitted March 2023 with chest pain and elevated troponin.  Given his renal function, decision was made to treat NSTEMI medically with 48 hrs heparin.   Echocardiogram that admission showed an LVEF of 40 to 45%, regional wall motion abnormality, grade 1 diastolic dysfunction, normal RV, and mild to moderate TR. OptiVol did not show fluid overload on the preceding 9 days. He was diuresed and discharged on 40 mg torsemide BID. He was in sinus rhythm.  At follow up in AHF clinic, he was back in rapid flutter and labetalol was changed to coreg. He continued to struggle with hypervolemia and has been eating high sodium foods. H was seen in Afib clinic 03/29/22 after BB was increased to 50 mg coreg in the AM and 37.5 mg PM. ON 03/29/22, he was back in sinus rhythm and cardioversion was canceled. Afib clinic 04/13/22 still in sinus rhythm, but continued to struggle with fluid balance. He was on 40 mg torsemide BID with extra 20 mg PRN  for weight gain.   He was hospitalized 4/21 - 04/18/2022 with acute on chronic combined heart failure and acute on chronic respiratory failure.  He was diuresed and discharged on increased dose of torsemide 60 mg daily.  He was hypotensive on 50 mg hydralazine twice daily and was discharged without this.  No SGLT2i, ACE, ARB, ARNI due to renal function. He was seen in AHF clinic 04/27/22 with ReDS 36%. He was increased to 60 mg torsemide BID x 2 days then back to 40 mg BID. Imdur 15 mg and 10 mg hydralazine TID were also added. Device showed < 1% AT/AF burden with last episode detected on 04/15/22.   Not currently on amiodarone.   He presents today for general cardiology follow up.   Current regimen includes: 10 mg hydralazine TID 50 mg every morning and 37.5 mg every afternoon carvedilol 15 mg Imdur 40 mg torsemide twice daily  40 mg Lipitor 81 mg aspirin 5 mg Eliquis twice daily  I saw him for general cardiology follow up on 05/24/22. He and his wife are very good at [facility] torsemide for fluid management - takes PRN about once weekly.   Labs by [facility] 04/07/22: sCr 2.66 K 4.4 Hb 10.6  He was wearing a heart monitor at that visit which has since shown sinus rhythm with 1723 runs of SVT - longest lasting 32 min.   He also was very sedentary, by  choice, no limitations due to symptoms.   He presents today for follow up.    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 cardiomyopathy Hypertension CKD stage IV Recent echo with an LVEF of 40 to 45% and normal RV GDMT limited by renal function Carvedilol 50 / 37.5 mg, unable to titrate metoprolol 10 mg hydralazine TID 15 mg imdur Nephrology initiated jardiance 40 mg torsemide BID with PRN 20 mg torsemide about once weekly   Sedentary lifestyle Long discussion at last visit regarding increasing movement   CAD Hyperlipidemia with LDL goal less than 70 S/p CABG x4 [facility] Last LHC 2019 with patent  grafts NSTEMI 3/23 with troponins peaked at 4100 -treated medically Continue aspirin, statin, coreg Intolerant to metoprolol   PAF/flutter Chronic anticoagulation Doing well on increased dose of carvedilol Anticoagulated with Eliquis, no bleeding issues   SVT 1723 runs of SVT on heart monitor Already on high dose coreg Will reach out to EP for further guidance           Past Medical History:  Diagnosis Date   Abrasion of forearm, right 07/27/2019   Acute respiratory failure (HCC)    AF (paroxysmal atrial fibrillation) ([place])    AICD (automatic cardioverter/defibrillator) present    CAD (coronary artery disease)    Cardiomyopathy, ischemic    Carotid artery occlusion    Chronic kidney disease    COPD (chronic obstructive pulmonary disease) ([place])    Cough 11/13/2019   Dementia without behavioral disturbance ([place])    Dizziness 01/17/2016   Facial rash 08/15/2018   GERD (gastroesophageal reflux disease)    Hyperlipidemia    Hypertension    Pre-syncope 01/16/2018   Respiratory distress 12/27/2020   Sleep apnea    Stroke ([place])    TIA (transient ischemic attack)     Past Surgical History:  Procedure Laterality Date   CARDIAC PACEMAKER PLACEMENT     CATARACT EXTRACTION     CORONARY ARTERY BYPASS GRAFT     CORONARY STENT PLACEMENT     EYE SURGERY     ICD IMPLANT N/A 12/29/2017   Procedure: ICD IMPLANT;  Surgeon: Joe Lance, MD;  Location: [facility];  Service: Cardiovascular;  Laterality: N/A;   LEFT HEART CATH AND CORS/GRAFTS ANGIOGRAPHY N/A 12/29/2017   Procedure: LEFT HEART CATH AND CORS/GRAFTS ANGIOGRAPHY;  Surgeon: Martinique, Peter M, MD;  Location: [facility];  Service: Cardiovascular;  Laterality: N/A;    Current Medications: No outpatient medications have been marked as taking for the 07/04/22 encounter (Appointment) with Joe Wiggins, [place].     Allergies:   Ipratropium, Metoprolol, Morphine and related, Vicodin [hydrocodone-acetaminophen],  and Penicillins   Social History   Socioeconomic History   Marital status: Married    Spouse name: Joe Wiggins   Number of children: 2   Years of education: Not on file   Highest education level: 11th grade  Occupational History   Occupation: Retired    Comment: Financial planner  Tobacco Use   Smoking status: Former    Types: Cigarettes    Quit date: 11/30/2012    Years since quitting: 9.5   Smokeless tobacco: Never  Vaping Use   Vaping Use: Never used  Substance and Sexual Activity   Alcohol use: No    Comment: last drink 10 + years   Drug use: No   Sexual activity: Not on file  Other Topics Concern   Not on file  Social History Narrative   Not on  file   Social Determinants of Health   Financial Resource Strain: Low Risk  (03/10/2022)   Overall Financial Resource Strain (CARDIA)    Difficulty of Paying Living Expenses: Not hard at all  Food Insecurity: No Food Insecurity (03/10/2022)   Hunger Vital Sign    Worried About Running Out of Food in the Last Year: Never true    Ran Out of Food in the Last Year: Never true  Transportation Needs: No Transportation Needs (03/10/2022)   PRAPARE - Hydrologist (Medical): No    Lack of Transportation (Non-Medical): No  Physical Activity: Not on file  Stress: Not on file  Social Connections: Not on file     Family History: The patient's ***family history includes Heart disease in his mother and sister; Hypertension in his mother and sister; Thyroid disease in his brother and sister.  ROS:   Please see the history of present illness.    *** All other systems reviewed and are negative.  EKGs/Labs/Other Studies Reviewed:    The following studies were reviewed today: ***  EKG:  EKG is *** ordered today.  The ekg ordered today demonstrates ***  Recent Labs: 03/07/2022: TSH 3.023 04/15/2022: Hemoglobin 10.9; Platelets 286 04/17/2022: Magnesium 2.5 04/27/2022: ALT 22; B Natriuretic Peptide 603.6;  BUN 27; Creatinine, Ser 2.46; Potassium 4.2; Sodium 135  Recent Lipid Panel    Component Value Date/Time   CHOL 164 04/17/2022 0354   CHOL 104 01/06/2021 1203   TRIG 156 (H) 04/17/2022 0354   HDL 28 (L) 04/17/2022 0354   HDL 54 01/06/2021 1203   CHOLHDL 5.9 04/17/2022 0354   VLDL 31 04/17/2022 0354   LDLCALC 105 (H) 04/17/2022 0354   LDLCALC 36 01/06/2021 1203     Risk Assessment/Calculations:   {Does this patient have ATRIAL FIBRILLATION?:[phone]}       Physical Exam:    VS:  There were no vitals taken for this visit.    Wt Readings from Last 3 Encounters:  05/24/22 186 lb 3.2 oz (84.5 kg)  05/16/22 183 lb (83 kg)  04/27/22 184 lb 9.6 oz (83.7 kg)     GEN: *** Well nourished, well developed in no acute distress HEENT: Normal NECK: No JVD; No carotid bruits LYMPHATICS: No lymphadenopathy CARDIAC: ***RRR, no murmurs, rubs, gallops RESPIRATORY:  Clear to auscultation without rales, wheezing or rhonchi  ABDOMEN: Soft, non-tender, non-distended MUSCULOSKELETAL:  No edema; No deformity  SKIN: Warm and dry NEUROLOGIC:  Alert and oriented x 3 PSYCHIATRIC:  Normal affect   ASSESSMENT:    No diagnosis found. PLAN:    In order of problems listed above:  ***      {Are you ordering a CV Procedure (e.g. stress test, cath, DCCV, TEE, etc)?   Press F2        :[id number]}    Medication Adjustments/Labs and Tests Ordered: Current medicines are reviewed at length with the patient today.  Concerns regarding medicines are outlined above.  No orders of the defined types were placed in this encounter.  No orders of the defined types were placed in this encounter.   There are no Patient Instructions on file for this visit.   Signed, Joe Wiggins, [place]  07/03/2022 8:32 PM    [facility]

## 2022-07-04 ENCOUNTER — Ambulatory Visit: Payer: Medicare HMO | Admitting: Physician Assistant

## 2022-07-04 ENCOUNTER — Encounter: Payer: Self-pay | Admitting: Physician Assistant

## 2022-07-04 VITALS — BP 182/84 | HR 76 | Ht 69.0 in | Wt 186.2 lb

## 2022-07-04 DIAGNOSIS — I471 Supraventricular tachycardia: Secondary | ICD-10-CM

## 2022-07-04 DIAGNOSIS — I5022 Chronic systolic (congestive) heart failure: Secondary | ICD-10-CM

## 2022-07-04 DIAGNOSIS — Z95810 Presence of automatic (implantable) cardiac defibrillator: Secondary | ICD-10-CM | POA: Diagnosis not present

## 2022-07-04 DIAGNOSIS — I4892 Unspecified atrial flutter: Secondary | ICD-10-CM

## 2022-07-04 DIAGNOSIS — I2510 Atherosclerotic heart disease of native coronary artery without angina pectoris: Secondary | ICD-10-CM

## 2022-07-04 DIAGNOSIS — I10 Essential (primary) hypertension: Secondary | ICD-10-CM

## 2022-07-04 DIAGNOSIS — E785 Hyperlipidemia, unspecified: Secondary | ICD-10-CM

## 2022-07-04 DIAGNOSIS — Z9189 Other specified personal risk factors, not elsewhere classified: Secondary | ICD-10-CM

## 2022-07-04 DIAGNOSIS — N184 Chronic kidney disease, stage 4 (severe): Secondary | ICD-10-CM

## 2022-07-04 DIAGNOSIS — I255 Ischemic cardiomyopathy: Secondary | ICD-10-CM | POA: Diagnosis not present

## 2022-07-04 DIAGNOSIS — Z7901 Long term (current) use of anticoagulants: Secondary | ICD-10-CM

## 2022-07-04 NOTE — Patient Instructions (Addendum)
Medication Instructions:  No Changes *If you need a refill on your cardiac medications before your next appointment, please call your pharmacy*   Lab Work: No Labs If you have labs (blood work) drawn today and your tests are completely normal, you will receive your results only by: [place] (if you have MyChart) OR A paper copy in the mail If you have any lab test that is abnormal or we need to change your treatment, we will call you to review the results.   Testing/Procedures: No Testing   Follow-Up: At [facility], you and your health needs are our priority.  As part of our continuing mission to provide you with exceptional heart care, we have created designated Provider Care Teams.  These Care Teams include your primary Cardiologist (physician) and Advanced Practice Providers (APPs -  Physician Assistants and Nurse Practitioners) who all work together to provide you with the care you need, when you need it.  We recommend signing up for the patient portal called "MyChart".  Sign up information is provided on this After Visit Summary.  MyChart is used to connect with patients for Virtual Visits (Telemedicine).  Patients are able to view lab/test results, encounter notes, upcoming appointments, etc.  Non-urgent messages can be sent to your provider as well.   To learn more about what you can do with MyChart, go to [email or web address].    Your next appointment:   5 month(s)  The format for your next appointment:   In Person  Provider:   Donato Heinz, MD     Other Instructions Maximum 1 hot dog per month.  Important Information About Sugar

## 2022-07-05 ENCOUNTER — Telehealth: Payer: Self-pay | Admitting: Cardiology

## 2022-07-05 ENCOUNTER — Telehealth: Payer: Self-pay | Admitting: Internal Medicine

## 2022-07-05 NOTE — Telephone Encounter (Signed)
STAT if HR is under 50 or over 120 (normal HR is 60-100 beats per minute)  What is your heart rate? This morning it was in the 30s. Now its the low 50s  Do you have a log of your heart rate readings (document readings)?    Do you have any other symptoms? Dizziness, nausea   ?

## 2022-07-05 NOTE — Telephone Encounter (Signed)
Spoke to patient's wife she stated husband had a episode appox 30 mins after morning medication he got dizzy,nausea, heart rate in low 30's .Episode lasted appox 30 mins.Stated he feels ok at present.She wanted to know if he needs to decrease Carvedilol dose.Advised I will send message to [place] for advice.

## 2022-07-05 NOTE — Telephone Encounter (Signed)
  STAT if HR is under 50 or over 120 (normal HR is 60-100 beats per minute)  What is your heart rate? 60 - at the time of the call  Do you have a log of your heart rate readings (document readings)? 35-50  Do you have any other symptoms? Pt's wife calling regarding pt's HR. She said it goes down to 35 and 90-92% O2 level. Also, she said, pt feels nauseated and dizzy sometimes. They would like to get Dr. Tanna Furry recommendations

## 2022-07-06 NOTE — Telephone Encounter (Signed)
Successful telephone encounter to patient/wife to request manual transmission to assess for episodes that would possible correlates to patient complaints. Transmission received and reviewed. Normal device function. No Alerts or Episodes noted. Presenting rhythm sinus with PVCs which may cause HR to be perceived as "low" on external HR monitor such as pulse ox or BP monitor. Confirmed follow up appointment with Dr. Lovena Le 07/29/22. Will continue to monitor.

## 2022-07-06 NOTE — Telephone Encounter (Signed)
He has a pacemaker so heart rate should not be dropping low.  May have been having PACs and heart rate measured lower than actual was.  Recommend device interrogation to see if was having arrhythmia yesterday and make sure device is functioning appropriately.  He has upcoming appointment with Dr. Lovena Le in EP

## 2022-07-06 NOTE — Telephone Encounter (Signed)
Duplicate message.  See phone note 07/05/2022

## 2022-07-11 ENCOUNTER — Other Ambulatory Visit: Payer: Self-pay

## 2022-07-11 ENCOUNTER — Encounter (HOSPITAL_COMMUNITY): Payer: Self-pay

## 2022-07-11 ENCOUNTER — Inpatient Hospital Stay (HOSPITAL_COMMUNITY): Payer: Medicare HMO

## 2022-07-11 ENCOUNTER — Emergency Department (HOSPITAL_COMMUNITY): Payer: Medicare HMO

## 2022-07-11 ENCOUNTER — Inpatient Hospital Stay (HOSPITAL_COMMUNITY)
Admission: EM | Admit: 2022-07-11 | Discharge: 2022-07-20 | DRG: 280 | Disposition: A | Discharge status: Hospice | Payer: Medicare HMO | Attending: Internal Medicine | Admitting: Internal Medicine

## 2022-07-11 DIAGNOSIS — I447 Left bundle-branch block, unspecified: Secondary | ICD-10-CM | POA: Diagnosis present

## 2022-07-11 DIAGNOSIS — I255 Ischemic cardiomyopathy: Secondary | ICD-10-CM | POA: Diagnosis present

## 2022-07-11 DIAGNOSIS — I5043 Acute on chronic combined systolic (congestive) and diastolic (congestive) heart failure: Secondary | ICD-10-CM | POA: Diagnosis present

## 2022-07-11 DIAGNOSIS — Z955 Presence of coronary angioplasty implant and graft: Secondary | ICD-10-CM

## 2022-07-11 DIAGNOSIS — E440 Moderate protein-calorie malnutrition: Secondary | ICD-10-CM | POA: Diagnosis present

## 2022-07-11 DIAGNOSIS — I21A1 Myocardial infarction type 2: Secondary | ICD-10-CM | POA: Diagnosis present

## 2022-07-11 DIAGNOSIS — Z7189 Other specified counseling: Secondary | ICD-10-CM | POA: Diagnosis not present

## 2022-07-11 DIAGNOSIS — M1A9XX0 Chronic gout, unspecified, without tophus (tophi): Secondary | ICD-10-CM | POA: Diagnosis present

## 2022-07-11 DIAGNOSIS — R000 Tachycardia, unspecified: Secondary | ICD-10-CM | POA: Diagnosis not present

## 2022-07-11 DIAGNOSIS — Z880 Allergy status to penicillin: Secondary | ICD-10-CM

## 2022-07-11 DIAGNOSIS — E1122 Type 2 diabetes mellitus with diabetic chronic kidney disease: Secondary | ICD-10-CM | POA: Diagnosis present

## 2022-07-11 DIAGNOSIS — I5023 Acute on chronic systolic (congestive) heart failure: Secondary | ICD-10-CM

## 2022-07-11 DIAGNOSIS — Z7984 Long term (current) use of oral hypoglycemic drugs: Secondary | ICD-10-CM

## 2022-07-11 DIAGNOSIS — F0150 Vascular dementia without behavioral disturbance: Secondary | ICD-10-CM | POA: Diagnosis present

## 2022-07-11 DIAGNOSIS — I480 Paroxysmal atrial fibrillation: Secondary | ICD-10-CM | POA: Diagnosis present

## 2022-07-11 DIAGNOSIS — R4189 Other symptoms and signs involving cognitive functions and awareness: Secondary | ICD-10-CM

## 2022-07-11 DIAGNOSIS — Z66 Do not resuscitate: Secondary | ICD-10-CM | POA: Diagnosis not present

## 2022-07-11 DIAGNOSIS — Z87891 Personal history of nicotine dependence: Secondary | ICD-10-CM

## 2022-07-11 DIAGNOSIS — I4892 Unspecified atrial flutter: Secondary | ICD-10-CM | POA: Diagnosis present

## 2022-07-11 DIAGNOSIS — I471 Supraventricular tachycardia: Secondary | ICD-10-CM | POA: Diagnosis not present

## 2022-07-11 DIAGNOSIS — I5021 Acute systolic (congestive) heart failure: Secondary | ICD-10-CM | POA: Diagnosis not present

## 2022-07-11 DIAGNOSIS — Z7982 Long term (current) use of aspirin: Secondary | ICD-10-CM | POA: Diagnosis not present

## 2022-07-11 DIAGNOSIS — J810 Acute pulmonary edema: Secondary | ICD-10-CM | POA: Diagnosis not present

## 2022-07-11 DIAGNOSIS — I25119 Atherosclerotic heart disease of native coronary artery with unspecified angina pectoris: Secondary | ICD-10-CM | POA: Diagnosis not present

## 2022-07-11 DIAGNOSIS — E785 Hyperlipidemia, unspecified: Secondary | ICD-10-CM | POA: Diagnosis present

## 2022-07-11 DIAGNOSIS — Z885 Allergy status to narcotic agent status: Secondary | ICD-10-CM

## 2022-07-11 DIAGNOSIS — Z8349 Family history of other endocrine, nutritional and metabolic diseases: Secondary | ICD-10-CM

## 2022-07-11 DIAGNOSIS — Z7901 Long term (current) use of anticoagulants: Secondary | ICD-10-CM

## 2022-07-11 DIAGNOSIS — N184 Chronic kidney disease, stage 4 (severe): Secondary | ICD-10-CM | POA: Diagnosis present

## 2022-07-11 DIAGNOSIS — Z8673 Personal history of transient ischemic attack (TIA), and cerebral infarction without residual deficits: Secondary | ICD-10-CM

## 2022-07-11 DIAGNOSIS — E1165 Type 2 diabetes mellitus with hyperglycemia: Secondary | ICD-10-CM | POA: Diagnosis not present

## 2022-07-11 DIAGNOSIS — Z951 Presence of aortocoronary bypass graft: Secondary | ICD-10-CM | POA: Diagnosis not present

## 2022-07-11 DIAGNOSIS — I25110 Atherosclerotic heart disease of native coronary artery with unstable angina pectoris: Secondary | ICD-10-CM | POA: Diagnosis present

## 2022-07-11 DIAGNOSIS — Z8249 Family history of ischemic heart disease and other diseases of the circulatory system: Secondary | ICD-10-CM

## 2022-07-11 DIAGNOSIS — N401 Enlarged prostate with lower urinary tract symptoms: Secondary | ICD-10-CM | POA: Diagnosis present

## 2022-07-11 DIAGNOSIS — J9621 Acute and chronic respiratory failure with hypoxia: Secondary | ICD-10-CM | POA: Diagnosis present

## 2022-07-11 DIAGNOSIS — Z95810 Presence of automatic (implantable) cardiac defibrillator: Secondary | ICD-10-CM

## 2022-07-11 DIAGNOSIS — Z20822 Contact with and (suspected) exposure to covid-19: Secondary | ICD-10-CM | POA: Diagnosis present

## 2022-07-11 DIAGNOSIS — R778 Other specified abnormalities of plasma proteins: Secondary | ICD-10-CM | POA: Diagnosis not present

## 2022-07-11 DIAGNOSIS — I509 Heart failure, unspecified: Secondary | ICD-10-CM | POA: Diagnosis not present

## 2022-07-11 DIAGNOSIS — Z9911 Dependence on respirator [ventilator] status: Secondary | ICD-10-CM | POA: Diagnosis not present

## 2022-07-11 DIAGNOSIS — N179 Acute kidney failure, unspecified: Secondary | ICD-10-CM | POA: Diagnosis present

## 2022-07-11 DIAGNOSIS — J9622 Acute and chronic respiratory failure with hypercapnia: Secondary | ICD-10-CM | POA: Diagnosis present

## 2022-07-11 DIAGNOSIS — R338 Other retention of urine: Secondary | ICD-10-CM | POA: Diagnosis present

## 2022-07-11 DIAGNOSIS — I10 Essential (primary) hypertension: Secondary | ICD-10-CM | POA: Diagnosis not present

## 2022-07-11 DIAGNOSIS — Z515 Encounter for palliative care: Secondary | ICD-10-CM | POA: Diagnosis not present

## 2022-07-11 DIAGNOSIS — I130 Hypertensive heart and chronic kidney disease with heart failure and stage 1 through stage 4 chronic kidney disease, or unspecified chronic kidney disease: Secondary | ICD-10-CM | POA: Diagnosis present

## 2022-07-11 DIAGNOSIS — T502X5A Adverse effect of carbonic-anhydrase inhibitors, benzothiadiazides and other diuretics, initial encounter: Secondary | ICD-10-CM | POA: Diagnosis present

## 2022-07-11 DIAGNOSIS — I252 Old myocardial infarction: Secondary | ICD-10-CM

## 2022-07-11 DIAGNOSIS — Z79899 Other long term (current) drug therapy: Secondary | ICD-10-CM

## 2022-07-11 DIAGNOSIS — J441 Chronic obstructive pulmonary disease with (acute) exacerbation: Secondary | ICD-10-CM | POA: Diagnosis present

## 2022-07-11 DIAGNOSIS — Z8674 Personal history of sudden cardiac arrest: Secondary | ICD-10-CM

## 2022-07-11 DIAGNOSIS — Z6826 Body mass index (BMI) 26.0-26.9, adult: Secondary | ICD-10-CM

## 2022-07-11 DIAGNOSIS — J9601 Acute respiratory failure with hypoxia: Secondary | ICD-10-CM | POA: Diagnosis not present

## 2022-07-11 DIAGNOSIS — N3001 Acute cystitis with hematuria: Secondary | ICD-10-CM | POA: Diagnosis not present

## 2022-07-11 DIAGNOSIS — D631 Anemia in chronic kidney disease: Secondary | ICD-10-CM | POA: Diagnosis present

## 2022-07-11 DIAGNOSIS — L304 Erythema intertrigo: Secondary | ICD-10-CM | POA: Diagnosis present

## 2022-07-11 DIAGNOSIS — Z7951 Long term (current) use of inhaled steroids: Secondary | ICD-10-CM

## 2022-07-11 DIAGNOSIS — Z9981 Dependence on supplemental oxygen: Secondary | ICD-10-CM

## 2022-07-11 DIAGNOSIS — K219 Gastro-esophageal reflux disease without esophagitis: Secondary | ICD-10-CM | POA: Diagnosis present

## 2022-07-11 DIAGNOSIS — Z888 Allergy status to other drugs, medicaments and biological substances status: Secondary | ICD-10-CM

## 2022-07-11 HISTORY — DX: Chronic respiratory failure, unspecified whether with hypoxia or hypercapnia: J96.10

## 2022-07-11 HISTORY — DX: Chronic kidney disease, stage 4 (severe): N18.4

## 2022-07-11 HISTORY — DX: Atrial premature depolarization: I49.1

## 2022-07-11 HISTORY — DX: Supraventricular tachycardia, unspecified: I47.10

## 2022-07-11 HISTORY — DX: Ischemic cardiomyopathy: I25.5

## 2022-07-11 HISTORY — DX: Presence of automatic (implantable) cardiac defibrillator: Z95.810

## 2022-07-11 HISTORY — DX: Unspecified atrial flutter: I48.92

## 2022-07-11 HISTORY — DX: Chronic systolic (congestive) heart failure: I50.22

## 2022-07-11 HISTORY — DX: Supraventricular tachycardia: I47.1

## 2022-07-11 HISTORY — DX: Ventricular premature depolarization: I49.3

## 2022-07-11 HISTORY — DX: Cardiac arrest, cause unspecified: I46.9

## 2022-07-11 LAB — I-STAT ARTERIAL BLOOD GAS, ED
Acid-Base Excess: 3 mmol/L — ABNORMAL HIGH (ref 0.0–2.0)
Bicarbonate: 30.4 mmol/L — ABNORMAL HIGH (ref 20.0–28.0)
Calcium, Ion: 1.35 mmol/L (ref 1.15–1.40)
HCT: 31 % — ABNORMAL LOW (ref 39.0–52.0)
Hemoglobin: 10.5 g/dL — ABNORMAL LOW (ref 13.0–17.0)
O2 Saturation: 100 %
Patient temperature: 99.1
Potassium: 4 mmol/L (ref 3.5–5.1)
Sodium: 138 mmol/L (ref 135–145)
TCO2: 32 mmol/L (ref 22–32)
pCO2 arterial: 63.4 mmHg — ABNORMAL HIGH (ref 32–48)
pH, Arterial: 7.29 — ABNORMAL LOW (ref 7.35–7.45)
pO2, Arterial: 227 mmHg — ABNORMAL HIGH (ref 83–108)

## 2022-07-11 LAB — CBC WITH DIFFERENTIAL/PLATELET
Abs Immature Granulocytes: 0.28 10*3/uL — ABNORMAL HIGH (ref 0.00–0.07)
Basophils Absolute: 0.2 10*3/uL — ABNORMAL HIGH (ref 0.0–0.1)
Basophils Relative: 1 %
Eosinophils Absolute: 0.6 10*3/uL — ABNORMAL HIGH (ref 0.0–0.5)
Eosinophils Relative: 5 %
HCT: 35 % — ABNORMAL LOW (ref 39.0–52.0)
Hemoglobin: 10.8 g/dL — ABNORMAL LOW (ref 13.0–17.0)
Immature Granulocytes: 2 %
Lymphocytes Relative: 13 %
Lymphs Abs: 1.7 10*3/uL (ref 0.7–4.0)
MCH: 31.5 pg (ref 26.0–34.0)
MCHC: 30.9 g/dL (ref 30.0–36.0)
MCV: 102 fL — ABNORMAL HIGH (ref 80.0–100.0)
Monocytes Absolute: 0.9 10*3/uL (ref 0.1–1.0)
Monocytes Relative: 7 %
Neutro Abs: 9.8 10*3/uL — ABNORMAL HIGH (ref 1.7–7.7)
Neutrophils Relative %: 72 %
Platelets: 352 10*3/uL (ref 150–400)
RBC: 3.43 MIL/uL — ABNORMAL LOW (ref 4.22–5.81)
RDW: 16.9 % — ABNORMAL HIGH (ref 11.5–15.5)
WBC: 13.4 10*3/uL — ABNORMAL HIGH (ref 4.0–10.5)
nRBC: 0 % (ref 0.0–0.2)

## 2022-07-11 LAB — COMPREHENSIVE METABOLIC PANEL
ALT: 20 U/L (ref 0–44)
AST: 27 U/L (ref 15–41)
Albumin: 4 g/dL (ref 3.5–5.0)
Alkaline Phosphatase: 150 U/L — ABNORMAL HIGH (ref 38–126)
Anion gap: 14 (ref 5–15)
BUN: 28 mg/dL — ABNORMAL HIGH (ref 8–23)
CO2: 22 mmol/L (ref 22–32)
Calcium: 9.7 mg/dL (ref 8.9–10.3)
Chloride: 101 mmol/L (ref 98–111)
Creatinine, Ser: 3 mg/dL — ABNORMAL HIGH (ref 0.61–1.24)
GFR, Estimated: 21 mL/min — ABNORMAL LOW (ref >60)
Glucose, Bld: 146 mg/dL — ABNORMAL HIGH (ref 70–99)
Potassium: 4.3 mmol/L (ref 3.5–5.1)
Sodium: 137 mmol/L (ref 135–145)
Total Bilirubin: 0.8 mg/dL (ref 0.3–1.2)
Total Protein: 7.3 g/dL (ref 6.5–8.1)

## 2022-07-11 LAB — GLUCOSE, CAPILLARY
Glucose-Capillary: 133 mg/dL — ABNORMAL HIGH (ref 70–99)
Glucose-Capillary: 143 mg/dL — ABNORMAL HIGH (ref 70–99)

## 2022-07-11 LAB — PROCALCITONIN: Procalcitonin: 0.22 ng/mL

## 2022-07-11 LAB — MAGNESIUM: Magnesium: 2.3 mg/dL (ref 1.7–2.4)

## 2022-07-11 LAB — TROPONIN I (HIGH SENSITIVITY)
Troponin I (High Sensitivity): 21 ng/L — ABNORMAL HIGH (ref <18)
Troponin I (High Sensitivity): 833 ng/L (ref <18)

## 2022-07-11 LAB — LACTIC ACID, PLASMA: Lactic Acid, Venous: 1.2 mmol/L (ref 0.5–1.9)

## 2022-07-11 LAB — BRAIN NATRIURETIC PEPTIDE: B Natriuretic Peptide: 777 pg/mL — ABNORMAL HIGH (ref 0.0–100.0)

## 2022-07-11 LAB — PROTIME-INR
INR: 1.7 — ABNORMAL HIGH (ref 0.8–1.2)
Prothrombin Time: 20.2 seconds — ABNORMAL HIGH (ref 11.4–15.2)

## 2022-07-11 LAB — MRSA NEXT GEN BY PCR, NASAL: MRSA by PCR Next Gen: NOT DETECTED

## 2022-07-11 MED ORDER — ALBUTEROL SULFATE (2.5 MG/3ML) 0.083% IN NEBU
2.5000 mg | INHALATION_SOLUTION | Freq: Four times a day (QID) | RESPIRATORY_TRACT | Status: DC | PRN
  Administered 2022-07-17 – 2022-07-19 (×3): 2.5 mg via RESPIRATORY_TRACT
  Filled 2022-07-11 (×3): qty 3

## 2022-07-11 MED ORDER — FENTANYL BOLUS VIA INFUSION
50.0000 ug | INTRAVENOUS | Status: DC | PRN
  Administered 2022-07-12 (×2): 50 ug via INTRAVENOUS

## 2022-07-11 MED ORDER — CARVEDILOL 12.5 MG PO TABS
12.5000 mg | ORAL_TABLET | Freq: Two times a day (BID) | ORAL | Status: DC
  Administered 2022-07-11 – 2022-07-12 (×2): 12.5 mg
  Filled 2022-07-11 (×2): qty 1

## 2022-07-11 MED ORDER — IPRATROPIUM-ALBUTEROL 0.5-2.5 (3) MG/3ML IN SOLN
3.0000 mL | Freq: Once | RESPIRATORY_TRACT | Status: AC
  Administered 2022-07-11: 3 mL via RESPIRATORY_TRACT

## 2022-07-11 MED ORDER — METHYLPREDNISOLONE SODIUM SUCC 125 MG IJ SOLR
120.0000 mg | Freq: Every day | INTRAMUSCULAR | Status: AC
Start: 2022-07-12 — End: 2022-07-13
  Administered 2022-07-12 – 2022-07-13 (×2): 120 mg via INTRAVENOUS
  Filled 2022-07-11 (×2): qty 2

## 2022-07-11 MED ORDER — LORATADINE 10 MG PO TABS
10.0000 mg | ORAL_TABLET | Freq: Every day | ORAL | Status: DC
  Administered 2022-07-12 – 2022-07-13 (×2): 10 mg
  Filled 2022-07-11 (×2): qty 1

## 2022-07-11 MED ORDER — SODIUM CHLORIDE 0.9 % IV SOLN
1.0000 g | Freq: Once | INTRAVENOUS | Status: AC
  Administered 2022-07-11: 1 g via INTRAVENOUS
  Filled 2022-07-11: qty 10

## 2022-07-11 MED ORDER — CARVEDILOL 12.5 MG PO TABS: 37.5000 mg | ORAL_TABLET | ORAL | Status: DC

## 2022-07-11 MED ORDER — ASPIRIN 81 MG PO CHEW
81.0000 mg | CHEWABLE_TABLET | Freq: Every day | ORAL | Status: DC
Start: 2022-07-12 — End: 2022-07-11

## 2022-07-11 MED ORDER — ONDANSETRON HCL 4 MG/2ML IJ SOLN: 4.0000 mg | Freq: Four times a day (QID) | INTRAMUSCULAR | Status: DC | PRN

## 2022-07-11 MED ORDER — IPRATROPIUM-ALBUTEROL 0.5-2.5 (3) MG/3ML IN SOLN
RESPIRATORY_TRACT | Status: AC
  Filled 2022-07-11: qty 3

## 2022-07-11 MED ORDER — DOCUSATE SODIUM 100 MG PO CAPS
100.0000 mg | ORAL_CAPSULE | Freq: Two times a day (BID) | ORAL | Status: DC | PRN
Start: 2022-07-11 — End: 2022-07-11

## 2022-07-11 MED ORDER — PANTOPRAZOLE SODIUM 40 MG PO TBEC: 40.0000 mg | DELAYED_RELEASE_TABLET | Freq: Every day | ORAL | Status: DC

## 2022-07-11 MED ORDER — HYDRALAZINE HCL 20 MG/ML IJ SOLN
20.0000 mg | Freq: Four times a day (QID) | INTRAMUSCULAR | Status: DC | PRN
Start: 2022-07-11 — End: 2022-07-18
  Administered 2022-07-11 – 2022-07-18 (×4): 20 mg via INTRAVENOUS
  Filled 2022-07-11 (×5): qty 1

## 2022-07-11 MED ORDER — BUDESONIDE 0.5 MG/2ML IN SUSP
0.5000 mg | Freq: Two times a day (BID) | RESPIRATORY_TRACT | Status: DC
Start: 2022-07-11 — End: 2022-07-20
  Administered 2022-07-11 – 2022-07-20 (×18): 0.5 mg via RESPIRATORY_TRACT
  Filled 2022-07-11 (×18): qty 2

## 2022-07-11 MED ORDER — IPRATROPIUM-ALBUTEROL 0.5-2.5 (3) MG/3ML IN SOLN
3.0000 mL | Freq: Four times a day (QID) | RESPIRATORY_TRACT | Status: DC
  Administered 2022-07-11 – 2022-07-13 (×9): 3 mL via RESPIRATORY_TRACT
  Filled 2022-07-11 (×9): qty 3

## 2022-07-11 MED ORDER — POLYETHYLENE GLYCOL 3350 17 G PO PACK: 17.0000 g | PACK | Freq: Every day | ORAL | Status: DC | PRN

## 2022-07-11 MED ORDER — FENTANYL 2500MCG IN NS 250ML (10MCG/ML) PREMIX INFUSION
0.0000 ug/h | INTRAVENOUS | Status: DC
  Administered 2022-07-11 (×2): 25 ug/h via INTRAVENOUS
  Administered 2022-07-13: 150 ug/h via INTRAVENOUS
  Filled 2022-07-11 (×3): qty 250

## 2022-07-11 MED ORDER — INSULIN ASPART 100 UNIT/ML IJ SOLN
0.0000 [IU] | INTRAMUSCULAR | Status: DC
  Administered 2022-07-11: 1 [IU] via SUBCUTANEOUS
  Administered 2022-07-12 (×2): 2 [IU] via SUBCUTANEOUS
  Administered 2022-07-12 (×2): 1 [IU] via SUBCUTANEOUS
  Administered 2022-07-12: 2 [IU] via SUBCUTANEOUS
  Administered 2022-07-12 – 2022-07-13 (×5): 1 [IU] via SUBCUTANEOUS
  Administered 2022-07-13 (×2): 2 [IU] via SUBCUTANEOUS
  Administered 2022-07-14 – 2022-07-15 (×3): 1 [IU] via SUBCUTANEOUS

## 2022-07-11 MED ORDER — ETOMIDATE 2 MG/ML IV SOLN
INTRAVENOUS | Status: AC | PRN
  Administered 2022-07-11: 20 mg via INTRAVENOUS

## 2022-07-11 MED ORDER — ATORVASTATIN CALCIUM 40 MG PO TABS
40.0000 mg | ORAL_TABLET | Freq: Every day | ORAL | Status: DC
Start: 2022-07-11 — End: 2022-07-13
  Administered 2022-07-11 – 2022-07-12 (×2): 40 mg
  Filled 2022-07-11 (×2): qty 1

## 2022-07-11 MED ORDER — ALLOPURINOL 100 MG PO TABS
100.0000 mg | ORAL_TABLET | Freq: Every day | ORAL | Status: DC
Start: 2022-07-12 — End: 2022-07-13
  Administered 2022-07-12 – 2022-07-13 (×2): 100 mg
  Filled 2022-07-11 (×2): qty 1

## 2022-07-11 MED ORDER — SODIUM CHLORIDE 0.9 % IV SOLN
500.0000 mg | Freq: Once | INTRAVENOUS | Status: AC
  Administered 2022-07-11: 500 mg via INTRAVENOUS
  Filled 2022-07-11: qty 5

## 2022-07-11 MED ORDER — PROPOFOL 1000 MG/100ML IV EMUL
5.0000 ug/kg/min | INTRAVENOUS | Status: DC
  Administered 2022-07-11: 20 ug/kg/min via INTRAVENOUS
  Administered 2022-07-11 (×2): 45 ug/kg/min via INTRAVENOUS
  Administered 2022-07-12: 25 ug/kg/min via INTRAVENOUS
  Administered 2022-07-12: 35 ug/kg/min via INTRAVENOUS
  Administered 2022-07-12: 5 ug/kg/min via INTRAVENOUS
  Administered 2022-07-13: 25 ug/kg/min via INTRAVENOUS
  Filled 2022-07-11 (×7): qty 100

## 2022-07-11 MED ORDER — VALACYCLOVIR HCL 500 MG PO TABS
1000.0000 mg | ORAL_TABLET | Freq: Every day | ORAL | Status: DC
Start: 2022-07-11 — End: 2022-07-11

## 2022-07-11 MED ORDER — METHYLPREDNISOLONE SODIUM SUCC 125 MG IJ SOLR
125.0000 mg | Freq: Once | INTRAMUSCULAR | Status: AC
  Administered 2022-07-11: 125 mg via INTRAVENOUS
  Filled 2022-07-11: qty 2

## 2022-07-11 MED ORDER — ASPIRIN 81 MG PO CHEW
81.0000 mg | CHEWABLE_TABLET | Freq: Every day | ORAL | Status: DC
Start: 2022-07-12 — End: 2022-07-13
  Administered 2022-07-12 – 2022-07-13 (×2): 81 mg
  Filled 2022-07-11 (×2): qty 1

## 2022-07-11 MED ORDER — DOCUSATE SODIUM 100 MG PO CAPS: 100.0000 mg | ORAL_CAPSULE | Freq: Two times a day (BID) | ORAL | Status: DC | PRN

## 2022-07-11 MED ORDER — ROCURONIUM BROMIDE 50 MG/5ML IV SOLN
INTRAVENOUS | Status: AC | PRN
  Administered 2022-07-11: 100 mg via INTRAVENOUS

## 2022-07-11 MED ORDER — FUROSEMIDE 10 MG/ML IJ SOLN
80.0000 mg | Freq: Once | INTRAMUSCULAR | Status: AC
  Administered 2022-07-11: 80 mg via INTRAVENOUS
  Filled 2022-07-11: qty 8

## 2022-07-11 MED ORDER — REVEFENACIN 175 MCG/3ML IN SOLN
175.0000 ug | Freq: Every day | RESPIRATORY_TRACT | Status: DC
  Administered 2022-07-12 – 2022-07-20 (×9): 175 ug via RESPIRATORY_TRACT
  Filled 2022-07-11 (×9): qty 3

## 2022-07-11 MED ORDER — IPRATROPIUM-ALBUTEROL 0.5-2.5 (3) MG/3ML IN SOLN
3.0000 mL | Freq: Four times a day (QID) | RESPIRATORY_TRACT | Status: DC
Start: 2022-07-11 — End: 2022-07-11

## 2022-07-11 MED ORDER — ACETAMINOPHEN 325 MG PO TABS: 650.0000 mg | ORAL_TABLET | ORAL | Status: DC | PRN

## 2022-07-11 MED ORDER — APIXABAN 5 MG PO TABS
5.0000 mg | ORAL_TABLET | Freq: Two times a day (BID) | ORAL | Status: DC
Start: 2022-07-11 — End: 2022-07-12
  Administered 2022-07-11: 5 mg
  Filled 2022-07-11: qty 1

## 2022-07-11 MED ORDER — ARFORMOTEROL TARTRATE 15 MCG/2ML IN NEBU
15.0000 ug | INHALATION_SOLUTION | Freq: Two times a day (BID) | RESPIRATORY_TRACT | Status: DC
  Administered 2022-07-11 – 2022-07-20 (×18): 15 ug via RESPIRATORY_TRACT
  Filled 2022-07-11 (×18): qty 2

## 2022-07-11 MED ORDER — SODIUM CHLORIDE 0.9 % IV SOLN
500.0000 mg | INTRAVENOUS | Status: DC
  Administered 2022-07-12: 500 mg via INTRAVENOUS
  Filled 2022-07-11: qty 5

## 2022-07-11 MED ORDER — PANTOPRAZOLE 2 MG/ML SUSPENSION
40.0000 mg | Freq: Every day | ORAL | Status: DC
  Administered 2022-07-11 – 2022-07-13 (×3): 40 mg
  Filled 2022-07-11 (×4): qty 20

## 2022-07-11 MED ORDER — SODIUM CHLORIDE 0.9 % IV SOLN
1.0000 g | INTRAVENOUS | Status: DC
  Administered 2022-07-12: 1 g via INTRAVENOUS
  Filled 2022-07-11: qty 10

## 2022-07-11 MED ORDER — DOCUSATE SODIUM 50 MG/5ML PO LIQD
100.0000 mg | Freq: Two times a day (BID) | ORAL | Status: DC | PRN
Start: 2022-07-11 — End: 2022-07-13

## 2022-07-11 NOTE — Sedation Documentation (Signed)
Pt intubated, 22 @ the lip.

## 2022-07-11 NOTE — ED Notes (Signed)
Critical Care provider at bedside

## 2022-07-11 NOTE — ED Provider Notes (Signed)
[facility] EMERGENCY DEPARTMENT Provider Note   CSN: [id number] Arrival date & time:        History  Chief Complaint  Patient presents with   Respiratory Distress    Joe Wiggins is a 76 y.o. male.  Presenting to the emergency department due to concern for respiratory distress.  Patient is unresponsive and unable to provide any history.  Additional history obtained from EMS report.  They report that patient was complaining of shortness of breath, initially alert, answering questions but then went on responsive.  Additional history obtained from wife over the phone, she states that patient recently has been complaining of worsening dyspnea, particularly with exertion, the breathing was a lot worse today.  No confusion or complaints of headache.  No chills or fever.  Completed chart review, reviewed last discharge summaries, notably -past medical history of multivessel CAD status post CABG x4, PAF on Eliquis, chronic combined HFrEF and HFpEF secondary to ischemic cardiomyopathy status post AICD, CKD stage IV, COPD, HTN, HLD, BPH, gout, presented with new onset of chest pain, troponin peaked at 4110  HPI     Home Medications Prior to Admission medications   Medication Sig Start Date End Date Taking? Authorizing Provider  albuterol (PROVENTIL HFA;VENTOLIN HFA) 108 (90 Base) MCG/ACT inhaler Inhale 2 puffs into the lungs every 6 (six) hours as needed for wheezing or shortness of breath. 06/30/16   [provider]  allopurinol (ZYLOPRIM) 100 MG tablet Take 200 mg by mouth daily.    [provider]  aspirin EC 81 MG tablet Take 1 tablet (81 mg total) by mouth daily. Swallow whole. 03/22/22   Lyda Jester M, PA-C  atorvastatin (LIPITOR) 40 MG tablet TAKE 1 TABLET EVERY DAY 01/24/22   Evans Lance, MD  carvedilol (COREG) 25 MG tablet TAKE 2 TABLETS(50 MG) BY MOUTH EVERY MORNING AND 1 AND 1/2 TABLETS(37.5 MG) BY MOUTH EVERY EVENING 05/06/22   Donato Heinz, MD  cetirizine (ZYRTEC) 10 MG tablet Take 10 mg by mouth daily.    [provider]  cholecalciferol (VITAMIN D) 1000 units tablet Take 1,000 Units by mouth daily.    [provider]  Cyanocobalamin (VITAMIN B 12 PO) Take 1 tablet by mouth daily.    [provider]  ELIQUIS 5 MG TABS tablet TAKE 1 TABLET TWICE DAILY 06/06/22   Donato Heinz, MD  ferrous sulfate 325 (65 FE) MG tablet Take 325 mg by mouth daily with breakfast. 01/03/18   [provider]  folic acid (FOLVITE) 903 MCG tablet Take 400 mcg by mouth daily.    [provider]  hydrALAZINE (APRESOLINE) 10 MG tablet Take 1 tablet (10 mg total) by mouth 3 (three) times daily. 05/06/22   Donato Heinz, MD  isosorbide mononitrate (IMDUR) 30 MG 24 hr tablet Take 0.5 tablets (15 mg total) by mouth daily. 05/06/22   Donato Heinz, MD  JARDIANCE 10 MG TABS tablet Take 1 tablet by mouth daily. 05/11/22   [provider]  Multiple Vitamin (MULTIVITAMIN) tablet Take 1 tablet by mouth daily.    [provider]  nitroGLYCERIN (NITROSTAT) 0.4 MG SL tablet PLACE 1 TABLET (0.4 MG TOTAL) UNDER THE TONGUE EVERY 5 (FIVE) MINUTES AS NEEDED FOR CHEST PAIN. 01/24/22   Evans Lance, MD  torsemide (DEMADEX) 20 MG tablet Take 40 mg by mouth 2 (two) times daily.    [provider]  TRELEGY ELLIPTA 100-62.5-25 MCG/ACT AEPB Inhale 1 puff into  the lungs daily. 01/21/22   [provider]  valACYclovir (VALTREX) 1000 MG tablet Take 1,000 mg by mouth in the morning and at bedtime. 05/20/22   [provider]      Allergies    Ipratropium, Metoprolol, Morphine and related, Vicodin [hydrocodone-acetaminophen], and Penicillins    Review of Systems   Review of Systems  Respiratory:  Positive for shortness of breath.     Physical Exam Updated Vital Signs BP (!) 152/85   Pulse (!) 122   Temp 99.1 F (37.3 C) (Rectal)   Resp 20   SpO2 100%   Physical Exam Vitals and nursing note reviewed.  Constitutional:      General: He is in acute distress.     Appearance: He is well-developed.     Comments: Unresponsive, BVM in progress  HENT:     Head: Normocephalic.  Eyes:     Conjunctiva/sclera: Conjunctivae normal.  Cardiovascular:     Rate and Rhythm: Regular rhythm. Tachycardia present.     Pulses: Normal pulses.     Heart sounds: No murmur heard. Pulmonary:     Comments: Occasional spontaneous breaths, poor air entry bilaterally, bilateral wheezing noted, prolonged expiratory phase Abdominal:     Palpations: Abdomen is soft.     Tenderness: There is no abdominal tenderness.  Musculoskeletal:        General: No swelling or deformity.     Cervical back: Neck supple.     Right lower leg: No edema.     Left lower leg: No edema.  Skin:    General: Skin is warm and dry.     Capillary Refill: Capillary refill takes less than 2 seconds.  Neurological:     Comments: Some occasional spontaneous movements in his extremities but does not open eyes, no verbal response     ED Results / Procedures / Treatments   Labs (all labs ordered are listed, but only abnormal results are displayed) Labs Reviewed  CBC WITH DIFFERENTIAL/PLATELET - Abnormal; Notable for the following components:      Result Value   WBC 13.4 (*)    RBC 3.43 (*)    Hemoglobin 10.8 (*)    HCT 35.0 (*)    MCV 102.0 (*)    RDW 16.9 (*)    Neutro Abs 9.8 (*)    Eosinophils Absolute 0.6 (*)    Basophils Absolute 0.2 (*)    Abs Immature Granulocytes 0.28 (*)    All other components within normal limits  PROTIME-INR - Abnormal; Notable for the following components:   Prothrombin Time 20.2 (*)    INR 1.7 (*)    All other components within normal limits  CULTURE, BLOOD (ROUTINE X 2)  CULTURE, BLOOD (ROUTINE X 2)  COMPREHENSIVE METABOLIC PANEL  BRAIN NATRIURETIC PEPTIDE  MAGNESIUM  BLOOD GAS, ARTERIAL  TROPONIN I (HIGH SENSITIVITY)    EKG EKG  Interpretation  Date/Time:  Monday July 11 2022 13:35:49 EDT Ventricular Rate:  92 PR Interval:  188 QRS Duration: 134 QT Interval:  396 QTC Calculation: 490 R Axis:   -42 Text Interpretation: Sinus rhythm Left bundle branch block Confirmed by Madalyn Rob [phone]) on 07/11/2022 1:57:41 PM  Radiology DG Chest Portable 1 View  Result Date: 07/11/2022 CLINICAL DATA:  A 76 year old male presents post intubation for assessment. EXAM: PORTABLE CHEST 1 VIEW COMPARISON:  April 15, 2022 FINDINGS: Endotracheal tube in place 7.2 cm from the carina, tip between clavicular heads. Gastric tube courses through in off the field the  radiograph into the upper abdomen. Multi lead pacer device in place, power pack over the LEFT chest. EKG leads over the chest. Post median sternotomy. Stable cardiomediastinal contours. Diffuse interstitial and alveolar opacities throughout both LEFT and RIGHT chest slightly worse in the RIGHT lower chest. No lobar consolidation. Blunting of LEFT costodiaphragmatic sulcus no pneumothorax. On limited assessment there is no acute skeletal process. IMPRESSION: 1. Endotracheal tube 7.2 cm from the carina. 2. Diffuse interstitial and alveolar opacities throughout both lungs, slightly worse in the RIGHT lower chest. Pattern of disease could reflect asymmetric pulmonary edema or atypical/multifocal infection 3. Question small left effusion. 4. Gastric tube courses through off the field the radiograph into the upper abdomen. Electronically Signed   By: Zetta Bills M.D.   On: 07/11/2022 14:12    Procedures .Critical Care  Performed by: Lucrezia Starch, MD Authorized by: Lucrezia Starch, MD   Critical care provider statement:    Critical care time (minutes):  33   Critical care was necessary to treat or prevent imminent or life-threatening deterioration of the following conditions:  Respiratory failure   Critical care was time spent personally by me on the following activities:   Development of treatment plan with patient or surrogate, discussions with consultants, evaluation of patient's response to treatment, examination of patient, ordering and review of laboratory studies, ordering and review of radiographic studies, ordering and performing treatments and interventions, pulse oximetry, re-evaluation of patient's condition and review of old charts Procedure Name: Intubation Date/Time: 07/11/2022 2:08 PM  Performed by: Lucrezia Starch, MDOxygen Delivery Method: Ambu bag Preoxygenation: Pre-oxygenation with 100% oxygen Induction Type: Rapid sequence, Cricoid Pressure applied and IV induction Ventilation: Mask ventilation without difficulty Laryngoscope Size: Glidescope and 4 Grade View: Grade I Tube size: 7.5 mm Number of attempts: 1 Airway Equipment and Method: Rigid stylet and Video-laryngoscopy Placement Confirmation: ETT inserted through vocal cords under direct vision, CO2 detector and Breath sounds checked- equal and bilateral Secured at: 22 cm Tube secured with: ETT holder        Medications Ordered in ED Medications  methylPREDNISolone sodium succinate (SOLU-MEDROL) 125 mg/2 mL injection 125 mg (has no administration in time range)  ipratropium-albuterol (DUONEB) 0.5-2.5 (3) MG/3ML nebulizer solution (0 mLs  Hold 07/11/22 1344)  propofol (DIPRIVAN) 1000 MG/100ML infusion (20 mcg/kg/min  84.5 kg Intravenous New Bag/Given 07/11/22 1346)  cefTRIAXone (ROCEPHIN) 1 g in sodium chloride 0.9 % 100 mL IVPB (has no administration in time range)  azithromycin (ZITHROMAX) 500 mg in sodium chloride 0.9 % 250 mL IVPB (has no administration in time range)  ipratropium-albuterol (DUONEB) 0.5-2.5 (3) MG/3ML nebulizer solution 3 mL (3 mLs Nebulization Given 07/11/22 1344)  etomidate (AMIDATE) injection (20 mg Intravenous Given 07/11/22 1330)  rocuronium (ZEMURON) injection (100 mg Intravenous Given 07/11/22 1331)    ED Course/ Medical Decision Making/ A&P                            Medical Decision Making Amount and/or Complexity of Data Reviewed Labs: ordered. Radiology: ordered.  Risk Prescription drug management. Decision regarding hospitalization.   76 year old man with multivessel CAD status post CABG x4, PAF on Eliquis, chronic combined HFrEF and HFpEF secondary to ischemic cardiomyopathy status post AICD, CKD stage IV, COPD, HTN, HLD, BPH, gout presenting to ER due to concern for respiratory distress.  Level to ER patient was in obvious respiratory distress, he was unresponsive.  Poor respiratory effort spontaneously.  Proceeded with RSI.  Concern for COPD versus heart failure.  No obvious edema to his legs.  EKG without obvious ischemic changes.  Alternating between sinus and atrial fibrillation which she does have a history of paroxysmal atrial fibrillation.  For suspected COPD gave steroids, DuoNeb.  Will admit to critical care for further management.  Discussed with Fuller Song with CCM. They will come admit.   Reviewed CXR and interpreted results. Airspace opacity bilateral noted, edema vs infection. Will start abx to cover possible pneumonia and ordered blood cultures.         Final Clinical Impression(s) / ED Diagnoses Final diagnoses:  Acute respiratory failure with hypoxia ([place])  Unresponsiveness    Rx / DC Orders ED Discharge Orders     None         Lucrezia Starch, MD 07/11/22 1434

## 2022-07-11 NOTE — ED Triage Notes (Signed)
Pt BIB [facility] from home as a Respiratory Distress. EMS reports he was sitting on the couch c/o SOB that started out initially with exertion, but upon their arrival he had trouble breathing with no activity. He was A/Ox4, that decreased as his resp status declined. He was placed on NRB mask & then they tried CPAP which he did not tolerate. Upon arrival to ED he was completely unresponsive & being bagged. He was noted to then have frothy sputum at his lips & urinated on himself in that moment right before he was intubated in ED room. Hx of COPD, CHF & MI few months ago. 20g PIV in Rt wrist, ST on their monitor with intermittent bradycardia noted.

## 2022-07-11 NOTE — Progress Notes (Addendum)
Patient transported to [facility] from ED without complications. RN at bedside.

## 2022-07-11 NOTE — ED Notes (Signed)
Xray at bedside for ET tube & OG placement verification.

## 2022-07-11 NOTE — Consult Note (Signed)
NAME:  Joe Wiggins, MRN:  [id number], DOB:  1946/02/07, LOS: 0 ADMISSION DATE:  07/11/2022, CONSULTATION DATE:  07/11/22 REFERRING MD:  EDP CHIEF COMPLAINT:  Acute Hypoxic Respiratory Failure   History of Present Illness:  Patient with PMHx noted below presenting to ED with worsening shortness of breath. His wife states this has been an issue for the patient for 3-4 weeks but it was exertional and this morning patient had worsening dyspnea that did not resolve. Wife states patient did not have any fevers, cough, n/v/d. He has been complaining of worsening dyspnea and orthopnea. She endorsed he is adherent to his medications.  His dyspnea worsened acutely and EMS was called.  They used nonrebreather and CPAP but the patient did not tolerate.  Patient was receiving bag-valve-mask on ED arrival and was intubated on arrival. Showed leukocytosis at 13.4k.  CMP showed creatinine at 3.0 from baseline of 2.4-2.5.  Troponins were mildly elevated.  BNP elevated at 777.  ABG obtained showed 7.3/63.4/227.  Blood cultures were obtained and patient started on empiric community pneumonia treatment.  That showed concern for pulmonary edema versus multifocal pneumonia. Pro-cal, and lactic acid are pending.  Patient was admitted to the ICU.  Pertinent  Medical History  Hx of chronic respiratory failure on home O2, PAF on AC, CAD, ischemic cardiomyopathy, ICD in place, CKD stage IV, COPD, dementia, GERD, hypertension, hyperlipidemia, history of CVA.  He has a history of multivessel disease status post CABG x4 in 2014 at [facility] Events: Including procedures, antibiotic start and stop dates in addition to other pertinent events   07/17-Presented to the ED and intubated. Admitted to ICU.  Interim History / Subjective:  Patient intubated and sedated.  Review of Systems:   Negative unless stated in the subjective.  Objective   Blood pressure (!) 152/85, pulse (!) 122, temperature 99.1 F (37.3 C),  temperature source Rectal, resp. rate 20, SpO2 100 %.    Vent Mode: PRVC FiO2 (%):  [100 %] 100 % Set Rate:  [20 bmp] 20 bmp Vt Set:  [560 mL] 560 mL PEEP:  [5 cmH20] 5 cmH20 Plateau Pressure:  [23 cmH20] 23 cmH20  No intake or output data in the 24 hours ending 07/11/22 1434 There were no vitals filed for this visit. Examination: General: Elderly male in NAD. Intubated and sedated HENT: NCAT, intubated and sedated, PERRL Lungs: CTAB Cardiovascular: Tachycardic, NSR, 2+ radial pulses, nonpalpable pedal pulses (confirmed pulses with doppler). No LE edema.  Abdomen: Soft, normal bowel sounds Extremities: No asymmetry, bruises on bilateral hands Neuro: unable to assess GU: intertrigo bilaterally.  [facility] Problem list    Assessment & Plan:  Acute on chronic hypoxic respiratory failure 2/2 HF exacerbation Hx of HFrEF  Hx of COPD Presented with dyspnea and oxygen sats in 80s and HR in 130s.  Likely appears secondary to pulmonary edema due to his heart failure.  Patient's Echo in 02/2022 shows G1DD, EF 40 %. There may be a component of flash pulmonary edema given his hypertensive blood pressures on presentation.  But there is a chronic worsening of his heart failure as he has been having worsening dyspnea and orthopnea for the past couple weeks.  Currently patient is getting antibiotics for pneumonia coverage, but given no infectious symptoms low threshold to discontinue them.  Procal pending and will use it to guide antibiotic use. - IV Lasix 80 mg - Continue ceftriaxone and azithromycin for pneumonia coverage - Monitor input and output,  and daily weight -Continue vent support  -PAD with RASS goal of 0 to -1  -VAP prevention  HTN CKD IV Chronic.  Patient sedated so we will hold off some of the patient's blood pressure medications but continue his Coreg given his tachycardia. - CTM - Renally dose medications and avoid nephrotoxic  medications  HLD CAD Chronic. -Continue home aspirin and statin.  PAF Chronic. -Continue home Eliquis.  GERD Chronic. -Continue home PPI  Gout Chronic.  -Continue home allopurinol  Best Practice (right click and "Reselect all SmartList Selections" daily)  Diet/type: NPO DVT prophylaxis: DOAC GI prophylaxis: PPI Lines: N/A Foley:  N/A Continuous: Propofol   Code Status:  full code Last date of multidisciplinary goals of care discussion [2130] Labs   CBC: Recent Labs  Lab 07/11/22 1352  WBC 13.4*  NEUTROABS 9.8*  HGB 10.8*  HCT 35.0*  MCV 102.0*  PLT 865   Basic Metabolic Panel: No results for input(s): "NA", "K", "CL", "CO2", "GLUCOSE", "BUN", "CREATININE", "CALCIUM", "MG", "PHOS" in the last 168 hours. GFR: CrCl cannot be calculated (Patient's most recent lab result is older than the maximum 21 days allowed.). Recent Labs  Lab 07/11/22 1352  WBC 13.4*   Liver Function Tests: No results for input(s): "AST", "ALT", "ALKPHOS", "BILITOT", "PROT", "ALBUMIN" in the last 168 hours. No results for input(s): "LIPASE", "AMYLASE" in the last 168 hours. No results for input(s): "AMMONIA" in the last 168 hours. ABG    Component Value Date/Time   PHART 7.268 (L) 04/15/2022 1843   PCO2ART 56.3 (H) 04/15/2022 1843   PO2ART 60 (L) 04/15/2022 1843   HCO3 25.8 04/15/2022 1843   TCO2 27 04/15/2022 1843   ACIDBASEDEF 2.0 04/15/2022 1843   O2SAT 86 04/15/2022 1843    Coagulation Profile: Recent Labs  Lab 07/11/22 1352  INR 1.7*   Cardiac Enzymes: No results for input(s): "CKTOTAL", "CKMB", "CKMBINDEX", "TROPONINI" in the last 168 hours. HbA1C: Hgb A1c MFr Bld  Date/Time Value Ref Range Status  04/17/2022 03:54 AM 5.8 (H) 4.8 - 5.6 % Final    Comment:    (NOTE) Pre diabetes:          5.7%-6.4%  Diabetes:              >6.4%  Glycemic control for   <7.0% adults with diabetes   12/26/2017 04:21 AM 5.6 4.8 - 5.6 % Final    Comment:    (NOTE) Pre diabetes:           5.7%-6.4% Diabetes:              >6.4% Glycemic control for   <7.0% adults with diabetes    CBG: No results for input(s): "GLUCAP" in the last 168 hours. Past Medical History:  He,  has a past medical history of Abrasion of forearm, right (07/27/2019), Acute respiratory failure ([place]), AF (paroxysmal atrial fibrillation) ([place]), AICD (automatic cardioverter/defibrillator) present, CAD (coronary artery disease), Cardiomyopathy, ischemic, Carotid artery occlusion, Chronic kidney disease, COPD (chronic obstructive pulmonary disease) ([place]), Cough (11/13/2019), Dementia without behavioral disturbance ([place]), Dizziness (01/17/2016), Facial rash (08/15/2018), GERD (gastroesophageal reflux disease), Hyperlipidemia, Hypertension, Pre-syncope (01/16/2018), Respiratory distress (12/27/2020), Sleep apnea, Stroke ([place]), and TIA (transient ischemic attack).  Surgical History:   Past Surgical History:  Procedure Laterality Date   CARDIAC PACEMAKER PLACEMENT     CATARACT EXTRACTION     CORONARY ARTERY BYPASS GRAFT     CORONARY STENT PLACEMENT     EYE SURGERY     ICD IMPLANT N/A 12/29/2017  Procedure: ICD IMPLANT;  Surgeon: Evans Lance, MD;  Location: [facility];  Service: Cardiovascular;  Laterality: N/A;   LEFT HEART CATH AND CORS/GRAFTS ANGIOGRAPHY N/A 12/29/2017   Procedure: LEFT HEART CATH AND CORS/GRAFTS ANGIOGRAPHY;  Surgeon: Martinique, Peter M, MD;  Location: [facility];  Service: Cardiovascular;  Laterality: N/A;    Social History:   reports that he quit smoking about 9 years ago. His smoking use included cigarettes. He has never used smokeless tobacco. He reports that he does not drink alcohol and does not use drugs.  Family History:  His family history includes Heart disease in his mother and sister; Hypertension in his mother and sister; Thyroid disease in his brother and sister.  Allergies Allergies  Allergen Reactions   Ipratropium Other (See Comments)   Metoprolol     Was  bringing blood pressure too low and he would pass out.   Morphine And Related Nausea Only   Vicodin [Hydrocodone-Acetaminophen] Other (See Comments)    Hallucinations    Penicillins Rash    Did it involve swelling of the face/tongue/throat, SOB, or low BP? No Did it involve sudden or severe rash/hives, skin peeling, or any reaction on the inside of your mouth or nose? Yes Did you need to seek medical attention at a hospital or doctor's office? No When did it last happen? Over 7 Years Ago      If all above answers are "NO", may proceed with cephalosporin use.      Home Medications  Prior to Admission medications   Medication Sig Start Date End Date Taking? Authorizing Provider  albuterol (PROVENTIL HFA;VENTOLIN HFA) 108 (90 Base) MCG/ACT inhaler Inhale 2 puffs into the lungs every 6 (six) hours as needed for wheezing or shortness of breath. 06/30/16   [provider]  allopurinol (ZYLOPRIM) 100 MG tablet Take 200 mg by mouth daily.    [provider]  aspirin EC 81 MG tablet Take 1 tablet (81 mg total) by mouth daily. Swallow whole. 03/22/22   Lyda Jester M, PA-C  atorvastatin (LIPITOR) 40 MG tablet TAKE 1 TABLET EVERY DAY 01/24/22   Evans Lance, MD  carvedilol (COREG) 25 MG tablet TAKE 2 TABLETS(50 MG) BY MOUTH EVERY MORNING AND 1 AND 1/2 TABLETS(37.5 MG) BY MOUTH EVERY EVENING 05/06/22   Donato Heinz, MD  cetirizine (ZYRTEC) 10 MG tablet Take 10 mg by mouth daily.    [provider]  cholecalciferol (VITAMIN D) 1000 units tablet Take 1,000 Units by mouth daily.    [provider]  Cyanocobalamin (VITAMIN B 12 PO) Take 1 tablet by mouth daily.    [provider]  ELIQUIS 5 MG TABS tablet TAKE 1 TABLET TWICE DAILY 06/06/22   Donato Heinz, MD  ferrous sulfate 325 (65 FE) MG tablet Take 325 mg by mouth daily with breakfast. 01/03/18   [provider]  folic acid (FOLVITE) 638 MCG tablet Take 400 mcg by mouth daily.     [provider]  hydrALAZINE (APRESOLINE) 10 MG tablet Take 1 tablet (10 mg total) by mouth 3 (three) times daily. 05/06/22   Donato Heinz, MD  isosorbide mononitrate (IMDUR) 30 MG 24 hr tablet Take 0.5 tablets (15 mg total) by mouth daily. 05/06/22   Donato Heinz, MD  JARDIANCE 10 MG TABS tablet Take 1 tablet by mouth daily. 05/11/22   [provider]  Multiple Vitamin (MULTIVITAMIN) tablet Take 1 tablet by mouth daily.  [provider]  nitroGLYCERIN (NITROSTAT) 0.4 MG SL tablet PLACE 1 TABLET (0.4 MG TOTAL) UNDER THE TONGUE EVERY 5 (FIVE) MINUTES AS NEEDED FOR CHEST PAIN. 01/24/22   Evans Lance, MD  torsemide (DEMADEX) 20 MG tablet Take 40 mg by mouth 2 (two) times daily.    [provider]  TRELEGY ELLIPTA 100-62.5-25 MCG/ACT AEPB Inhale 1 puff into the lungs daily. 01/21/22   [provider]  valACYclovir (VALTREX) 1000 MG tablet Take 1,000 mg by mouth in the morning and at bedtime. 05/20/22   [provider]    Idamae Schuller, MD Tillie Rung. [facility] Internal Medicine Residency, PGY-2

## 2022-07-11 NOTE — Progress Notes (Signed)
[facility] Progress Note Patient Name: Kingsley Farace DOB: [birth year] MRN: [id number]   Date of Service  07/11/2022  HPI/Events of Note  Patient with urinary retention, + 543 ml of urine in the bladder.  eICU Interventions  In / Out bladder catheterization ordered.        Kerry Kass Takoda [place] 07/11/2022, 10:28 PM

## 2022-07-12 ENCOUNTER — Inpatient Hospital Stay (HOSPITAL_COMMUNITY): Payer: Medicare HMO

## 2022-07-12 ENCOUNTER — Encounter (HOSPITAL_COMMUNITY): Payer: Self-pay | Admitting: Pulmonary Disease

## 2022-07-12 DIAGNOSIS — I255 Ischemic cardiomyopathy: Secondary | ICD-10-CM | POA: Diagnosis not present

## 2022-07-12 DIAGNOSIS — R000 Tachycardia, unspecified: Secondary | ICD-10-CM | POA: Diagnosis not present

## 2022-07-12 DIAGNOSIS — E440 Moderate protein-calorie malnutrition: Secondary | ICD-10-CM | POA: Diagnosis not present

## 2022-07-12 DIAGNOSIS — I471 Supraventricular tachycardia: Secondary | ICD-10-CM | POA: Diagnosis not present

## 2022-07-12 DIAGNOSIS — J810 Acute pulmonary edema: Secondary | ICD-10-CM

## 2022-07-12 DIAGNOSIS — J9601 Acute respiratory failure with hypoxia: Secondary | ICD-10-CM | POA: Diagnosis not present

## 2022-07-12 DIAGNOSIS — I25119 Atherosclerotic heart disease of native coronary artery with unspecified angina pectoris: Secondary | ICD-10-CM

## 2022-07-12 DIAGNOSIS — Z9911 Dependence on respirator [ventilator] status: Secondary | ICD-10-CM

## 2022-07-12 DIAGNOSIS — I480 Paroxysmal atrial fibrillation: Secondary | ICD-10-CM

## 2022-07-12 DIAGNOSIS — I5023 Acute on chronic systolic (congestive) heart failure: Secondary | ICD-10-CM | POA: Diagnosis not present

## 2022-07-12 LAB — GLUCOSE, CAPILLARY
Glucose-Capillary: 133 mg/dL — ABNORMAL HIGH (ref 70–99)
Glucose-Capillary: 142 mg/dL — ABNORMAL HIGH (ref 70–99)
Glucose-Capillary: 148 mg/dL — ABNORMAL HIGH (ref 70–99)
Glucose-Capillary: 149 mg/dL — ABNORMAL HIGH (ref 70–99)
Glucose-Capillary: 151 mg/dL — ABNORMAL HIGH (ref 70–99)
Glucose-Capillary: 156 mg/dL — ABNORMAL HIGH (ref 70–99)
Glucose-Capillary: 157 mg/dL — ABNORMAL HIGH (ref 70–99)

## 2022-07-12 LAB — CBC
HCT: 30.3 % — ABNORMAL LOW (ref 39.0–52.0)
Hemoglobin: 9.8 g/dL — ABNORMAL LOW (ref 13.0–17.0)
MCH: 30.6 pg (ref 26.0–34.0)
MCHC: 32.3 g/dL (ref 30.0–36.0)
MCV: 94.7 fL (ref 80.0–100.0)
Platelets: 222 10*3/uL (ref 150–400)
RBC: 3.2 MIL/uL — ABNORMAL LOW (ref 4.22–5.81)
RDW: 16.5 % — ABNORMAL HIGH (ref 11.5–15.5)
WBC: 7.6 10*3/uL (ref 4.0–10.5)
nRBC: 0 % (ref 0.0–0.2)

## 2022-07-12 LAB — BASIC METABOLIC PANEL
Anion gap: 14 (ref 5–15)
Anion gap: 14 (ref 5–15)
Anion gap: 16 — ABNORMAL HIGH (ref 5–15)
BUN: 29 mg/dL — ABNORMAL HIGH (ref 8–23)
BUN: 38 mg/dL — ABNORMAL HIGH (ref 8–23)
BUN: 38 mg/dL — ABNORMAL HIGH (ref 8–23)
CO2: 21 mmol/L — ABNORMAL LOW (ref 22–32)
CO2: 21 mmol/L — ABNORMAL LOW (ref 22–32)
CO2: 20 mmol/L — ABNORMAL LOW (ref 22–32)
Calcium: 9.5 mg/dL (ref 8.9–10.3)
Calcium: 9.7 mg/dL (ref 8.9–10.3)
Calcium: 9.9 mg/dL (ref 8.9–10.3)
Chloride: 104 mmol/L (ref 98–111)
Chloride: 106 mmol/L (ref 98–111)
Chloride: 104 mmol/L (ref 98–111)
Creatinine, Ser: 3.02 mg/dL — ABNORMAL HIGH (ref 0.61–1.24)
Creatinine, Ser: 3.31 mg/dL — ABNORMAL HIGH (ref 0.61–1.24)
Creatinine, Ser: 3.23 mg/dL — ABNORMAL HIGH (ref 0.61–1.24)
GFR, Estimated: 21 mL/min — ABNORMAL LOW (ref >60)
GFR, Estimated: 18 mL/min — ABNORMAL LOW (ref >60)
GFR, Estimated: 19 mL/min — ABNORMAL LOW (ref >60)
Glucose, Bld: 155 mg/dL — ABNORMAL HIGH (ref 70–99)
Glucose, Bld: 154 mg/dL — ABNORMAL HIGH (ref 70–99)
Glucose, Bld: 160 mg/dL — ABNORMAL HIGH (ref 70–99)
Potassium: 3.5 mmol/L (ref 3.5–5.1)
Potassium: 3.8 mmol/L (ref 3.5–5.1)
Potassium: 3.9 mmol/L (ref 3.5–5.1)
Sodium: 139 mmol/L (ref 135–145)
Sodium: 141 mmol/L (ref 135–145)
Sodium: 140 mmol/L (ref 135–145)

## 2022-07-12 LAB — POCT I-STAT 7, (LYTES, BLD GAS, ICA,H+H)
Acid-Base Excess: 2 mmol/L (ref 0.0–2.0)
Bicarbonate: 24.9 mmol/L (ref 20.0–28.0)
Calcium, Ion: 1.21 mmol/L (ref 1.15–1.40)
HCT: 31 % — ABNORMAL LOW (ref 39.0–52.0)
Hemoglobin: 10.5 g/dL — ABNORMAL LOW (ref 13.0–17.0)
O2 Saturation: 98 %
Patient temperature: 97.3
Potassium: 3.9 mmol/L (ref 3.5–5.1)
Sodium: 139 mmol/L (ref 135–145)
TCO2: 26 mmol/L (ref 22–32)
pCO2 arterial: 32.1 mmHg (ref 32–48)
pH, Arterial: 7.494 — ABNORMAL HIGH (ref 7.35–7.45)
pO2, Arterial: 90 mmHg (ref 83–108)

## 2022-07-12 LAB — ECHOCARDIOGRAM COMPLETE
AR max vel: 2.91 cm2
AV Peak grad: 3.2 mmHg
Ao pk vel: 0.9 m/s
Height: 69 in
S' Lateral: 4.6 cm
Weight: 2892.44 oz

## 2022-07-12 LAB — TROPONIN I (HIGH SENSITIVITY)
Troponin I (High Sensitivity): 990 ng/L (ref <18)
Troponin I (High Sensitivity): 1166 ng/L (ref <18)

## 2022-07-12 LAB — PHOSPHORUS
Phosphorus: 2.2 mg/dL — ABNORMAL LOW (ref 2.5–4.6)
Phosphorus: 2.7 mg/dL (ref 2.5–4.6)

## 2022-07-12 LAB — TRIGLYCERIDES: Triglycerides: 191 mg/dL — ABNORMAL HIGH (ref <150)

## 2022-07-12 LAB — PROCALCITONIN: Procalcitonin: 0.29 ng/mL

## 2022-07-12 LAB — LACTIC ACID, PLASMA: Lactic Acid, Venous: 1.1 mmol/L (ref 0.5–1.9)

## 2022-07-12 LAB — APTT: aPTT: 92 seconds — ABNORMAL HIGH (ref 24–36)

## 2022-07-12 LAB — MAGNESIUM
Magnesium: 2.3 mg/dL (ref 1.7–2.4)
Magnesium: 2.5 mg/dL — ABNORMAL HIGH (ref 1.7–2.4)
Magnesium: 2.4 mg/dL (ref 1.7–2.4)

## 2022-07-12 LAB — SARS CORONAVIRUS 2 BY RT PCR: SARS Coronavirus 2 by RT PCR: NEGATIVE

## 2022-07-12 LAB — TSH: TSH: 1.029 u[IU]/mL (ref 0.350–4.500)

## 2022-07-12 MED ORDER — PERFLUTREN LIPID MICROSPHERE
1.0000 mL | INTRAVENOUS | Status: AC | PRN
  Administered 2022-07-12: 4 mL via INTRAVENOUS

## 2022-07-12 MED ORDER — FUROSEMIDE 10 MG/ML IJ SOLN
80.0000 mg | Freq: Three times a day (TID) | INTRAMUSCULAR | Status: DC
Start: 2022-07-12 — End: 2022-07-12
  Administered 2022-07-12: 80 mg via INTRAVENOUS
  Filled 2022-07-12: qty 8

## 2022-07-12 MED ORDER — CHLORHEXIDINE GLUCONATE CLOTH 2 % EX PADS
6.0000 | MEDICATED_PAD | Freq: Every day | CUTANEOUS | Status: DC
  Administered 2022-07-12 – 2022-07-20 (×9): 6 via TOPICAL

## 2022-07-12 MED ORDER — AMIODARONE LOAD VIA INFUSION
150.0000 mg | Freq: Once | INTRAVENOUS | Status: AC
  Administered 2022-07-12: 150 mg via INTRAVENOUS
  Filled 2022-07-12: qty 83.34

## 2022-07-12 MED ORDER — VITAL 1.5 CAL PO LIQD
1000.0000 mL | ORAL | Status: DC
  Administered 2022-07-12 – 2022-07-13 (×2): 1000 mL

## 2022-07-12 MED ORDER — SODIUM CHLORIDE 0.9 % IV SOLN
100.0000 mg | Freq: Two times a day (BID) | INTRAVENOUS | Status: DC
  Administered 2022-07-12 (×2): 100 mg via INTRAVENOUS
  Filled 2022-07-12 (×3): qty 100

## 2022-07-12 MED ORDER — CARVEDILOL 25 MG PO TABS
25.0000 mg | ORAL_TABLET | Freq: Two times a day (BID) | ORAL | Status: DC
  Administered 2022-07-12 – 2022-07-13 (×2): 25 mg
  Filled 2022-07-12 (×2): qty 1

## 2022-07-12 MED ORDER — AMIODARONE HCL IN DEXTROSE 360-4.14 MG/200ML-% IV SOLN
60.0000 mg/h | INTRAVENOUS | Status: AC
  Administered 2022-07-12 (×2): 60 mg/h via INTRAVENOUS
  Filled 2022-07-12 (×2): qty 200

## 2022-07-12 MED ORDER — DOXAZOSIN MESYLATE 1 MG PO TABS
1.0000 mg | ORAL_TABLET | Freq: Every day | ORAL | Status: DC
  Administered 2022-07-12 – 2022-07-13 (×2): 1 mg
  Filled 2022-07-12 (×2): qty 1

## 2022-07-12 MED ORDER — ORAL CARE MOUTH RINSE: 15.0000 mL | OROMUCOSAL | Status: DC | PRN

## 2022-07-12 MED ORDER — HEPARIN (PORCINE) 25000 UT/250ML-% IV SOLN
1050.0000 [IU]/h | INTRAVENOUS | Status: DC
  Administered 2022-07-12 – 2022-07-13 (×2): 1150 [IU]/h via INTRAVENOUS
  Administered 2022-07-14 – 2022-07-15 (×2): 1050 [IU]/h via INTRAVENOUS
  Filled 2022-07-12 (×4): qty 250

## 2022-07-12 MED ORDER — ORAL CARE MOUTH RINSE
15.0000 mL | OROMUCOSAL | Status: DC
Start: 2022-07-12 — End: 2022-07-13
  Administered 2022-07-12 – 2022-07-13 (×14): 15 mL via OROMUCOSAL

## 2022-07-12 MED ORDER — TAMSULOSIN HCL 0.4 MG PO CAPS: 0.4000 mg | ORAL_CAPSULE | Freq: Every day | ORAL | Status: DC

## 2022-07-12 MED ORDER — AMIODARONE HCL IN DEXTROSE 360-4.14 MG/200ML-% IV SOLN
30.0000 mg/h | INTRAVENOUS | Status: DC
  Administered 2022-07-13 – 2022-07-15 (×5): 30 mg/h via INTRAVENOUS
  Filled 2022-07-12 (×5): qty 200

## 2022-07-12 MED ORDER — PROSOURCE TF PO LIQD
45.0000 mL | Freq: Three times a day (TID) | ORAL | Status: DC
Start: 2022-07-12 — End: 2022-07-13
  Administered 2022-07-12 – 2022-07-13 (×3): 45 mL
  Filled 2022-07-12 (×4): qty 45

## 2022-07-12 MED ORDER — POTASSIUM CHLORIDE 20 MEQ PO PACK
40.0000 meq | PACK | Freq: Once | ORAL | Status: AC
  Administered 2022-07-12: 40 meq
  Filled 2022-07-12: qty 2

## 2022-07-12 NOTE — H&P (Signed)
See Consult note from today to serve as H+P

## 2022-07-12 NOTE — Progress Notes (Signed)
Initial Nutrition Assessment  DOCUMENTATION CODES:   Non-severe (moderate) malnutrition in context of chronic illness  INTERVENTION:   Tube Feeding via OG:  Vital 1.5 at 50 ml/hr Pro-Source TF 45 mL TID This provides 1920 kcals, 114 g of protein and 912 mL of free water  NUTRITION DIAGNOSIS:   Moderate Malnutrition related to acute illness, chronic illness (acute on chronic CHF, CKD, COPD) as evidenced by moderate muscle depletion, mild fat depletion.  GOAL:   Patient will meet greater than or equal to 90% of their needs  MONITOR:   Vent status, Labs, TF tolerance, Weight trends  REASON FOR ASSESSMENT:   Ventilator    ASSESSMENT:   76 yo male admitted with acute on chronic respiratory failure 2/2 HF exacerbation PMH includes CKD IV, COPD, dementia, chronic respiratory failure on home oxygen, HTN, HLD, hx of CVA, CABG x 4  7/17 Admitted, Intubated  No BM, +flatus, abdomen soft, BS present. OG tube tip in stomach per abd xray  Pt alert on vent support Wife at bedside and able to provide history.   UBW around 175-177 pounds; pt weighs himself daily. Pt does experience 1 or 2 pounds weight gain a day at times but has been told to take an extra dose of torsemide and weight trends back down. Current wt 181 pounds.   Wife reports pt mobility has decline; mostly just sits on the couch, especially over the last 2-3 weeks. Pt is fatigue and SOB  Pt has been eating poorly for the last 1-2 weeks. Wife reports pt has not had an appetite and does not want to eat but makes him self eat a little something 3 times per day. Not taking any oral nutrition supplements  Pt does take MVI, vit D, iron supplement at home  Labs: Creatinine 3.02, BUN 29, CBGs 13-056 Meds: lasix, ss novolog, solumedrol, KCl x 1 dose  NUTRITION - FOCUSED PHYSICAL EXAM:  Flowsheet Row Most Recent Value  Orbital Region No depletion (P)   Upper Arm Region Unable to assess (P)   Thoracic and Lumbar Region No  depletion (P)   Buccal Region Unable to assess (P)   Temple Region No depletion (P)   Clavicle Bone Region Mild depletion (P)   Clavicle and Acromion Bone Region Mild depletion (P)   Scapular Bone Region Mild depletion (P)   Dorsal Hand No depletion (P)   Patellar Region Moderate depletion (P)   Anterior Thigh Region Moderate depletion (P)   Posterior Calf Region Moderate depletion (P)   Edema (RD Assessment) Mild (P)   [All extremities]       Diet Order:   Diet Order             Diet NPO time specified  Diet effective now                   EDUCATION NEEDS:   Not appropriate for education at this time  Skin:  Skin Assessment: Reviewed RN Assessment  Last BM:  no BM  Height:   Ht Readings from Last 1 Encounters:  07/12/22 5\' 9"  (1.753 m)    Weight:   Wt Readings from Last 1 Encounters:  07/12/22 82 kg     BMI:  Body mass index is 26.7 kg/m.  Estimated Nutritional Needs:   Kcal:  1800-2000 kcals  Protein:  110-130 g  Fluid:  1.8 L    Kerman Passey MS, RDN, LDN, CNSC Registered Dietitian 3 Clinical Nutrition RD Pager and On-Call Pager  Number Located in [place]

## 2022-07-12 NOTE — Progress Notes (Signed)
NAME:  Joe Wiggins, MRN:  [id number], DOB:  07-Mar-1946, LOS: 1 ADMISSION DATE:  07/11/2022, CONSULTATION DATE:  07/11/22 REFERRING MD:  EDP CHIEF COMPLAINT:  Acute Hypoxic Respiratory Failure   History of Present Illness:  Patient with PMHx noted below presenting to ED with worsening shortness of breath. His wife states this has been an issue for the patient for 3-4 weeks but it was exertional and this morning patient had worsening dyspnea that did not resolve. Wife states patient did not have any fevers, cough, n/v/d. He has been complaining of worsening dyspnea and orthopnea. She endorsed he is adherent to his medications.  His dyspnea worsened acutely and EMS was called.  They used nonrebreather and CPAP but the patient did not tolerate.  Patient was receiving bag-valve-mask on ED arrival and was intubated on arrival. Showed leukocytosis at 13.4k.  CMP showed creatinine at 3.0 from baseline of 2.4-2.5.  Troponins were mildly elevated.  BNP elevated at 777.  ABG obtained showed 7.3/63.4/227.  Blood cultures were obtained and patient started on empiric community pneumonia treatment.  That showed concern for pulmonary edema versus multifocal pneumonia. Pro-cal, and lactic acid are pending.  Patient was admitted to the ICU.  Pertinent  Medical History  Hx of chronic respiratory failure on home O2, PAF on AC, CAD, ischemic cardiomyopathy, ICD in place, CKD stage IV, COPD, dementia, GERD, hypertension, hyperlipidemia, history of CVA.  He has a history of multivessel disease status post CABG x4 in 2014 at [facility] Events: Including procedures, antibiotic start and stop dates in addition to other pertinent events   07/17-Presented to the ED and intubated. Admitted to ICU.  Interim History / Subjective:  71 2023 failed weaning protocol  Objective   Blood pressure 126/76, pulse (!) 127, temperature (!) 97.3 F (36.3 C), temperature source Axillary, resp. rate (!) 22, height 5\' 9"  (1.753  m), weight 82 kg, SpO2 97 %.    Vent Mode: CPAP;PSV FiO2 (%):  [40 %-100 %] 40 % Set Rate:  [20 bmp-24 bmp] 24 bmp Vt Set:  [560 mL] 560 mL PEEP:  [5 cmH20] 5 cmH20 Pressure Support:  [8 cmH20] 8 cmH20 Plateau Pressure:  [18 cmH20-23 cmH20] 19 cmH20   Intake/Output Summary (Last 24 hours) at 07/12/2022 0853 Last data filed at 07/12/2022 0800 Gross per 24 hour  Intake 426.09 ml  Output 1725 ml  Net -1298.91 ml   Filed Weights   07/11/22 1730 07/12/22 0500  Weight: 81.8 kg 82 kg   Examination: General: Elderly male in NAD. Intubated and sedated HENT: NCAT, intubated and sedated, PERRL Lungs: CTAB Cardiovascular: Tachycardic, NSR, 2+ radial pulses, nonpalpable pedal pulses (confirmed pulses with doppler). No LE edema.  Abdomen: Soft, normal bowel sounds Extremities: No asymmetry, bruises on bilateral hands Neuro: unable to assess GU: intertrigo bilaterally.  [facility] Problem list    Assessment & Plan:  Acute on chronic hypoxic respiratory failure 2/2 HF exacerbation Hx of HFrEF  Hx of COPD Presented with dyspnea and oxygen sats in 80s and HR in 130s.  Likely appears secondary to pulmonary edema due to his heart failure.  Patient's Echo in 02/2022 shows G1DD, EF 40 %. There may be a component of flash pulmonary edema given his hypertensive blood pressures on presentation.  But there is a chronic worsening of his heart failure as he has been having worsening dyspnea and orthopnea for the past couple weeks.  Currently patient is getting antibiotics for pneumonia coverage, but given  no infectious symptoms low threshold to discontinue them.  Procal pending and will use it to guide antibiotic use.  07/13/2022 did not tolerate weaning protocol placed back on full ventilatory support Monitor creatinine Continue Zithromax and ceftriaxone at this time procalcitonin is noted to be 22 Continue full vent support Wean as tolerated Questionable further Lasix  HTN CKD  IV Lab Results  Component Value Date   CREATININE 3.02 (H) 07/12/2022   CREATININE 3.00 (H) 07/11/2022   CREATININE 2.46 (H) 04/27/2022     Chronic.  Patient sedated so we will hold off some of the patient's blood pressure medications but continue his Coreg given his tachycardia. Avoid nephrotoxins Questionable continue Lasix  HLD CAD Chronic. Continue home aspirin and statin  PAF Chronic. Eliquis  GERD Chronic. PPI  Gout Chronic.  Allopurinol Best Practice (right click and "Reselect all SmartList Selections" daily)  Diet/type: NPO DVT prophylaxis: DOAC GI prophylaxis: PPI Lines: N/A Foley:  N/A Continuous: Propofol   Code Status:  full code Last date of multidisciplinary goals of care discussion [7824] 07/12/2022 updated wife at bedside discussion of CODE STATUS remains full code. Labs   CBC: Recent Labs  Lab 07/11/22 1352 07/11/22 1435 07/12/22 0121  WBC 13.4*  --  7.6  NEUTROABS 9.8*  --   --   HGB 10.8* 10.5* 9.8*  HCT 35.0* 31.0* 30.3*  MCV 102.0*  --  94.7  PLT 352  --  235   Basic Metabolic Panel: Recent Labs  Lab 07/11/22 1352 07/11/22 1435 07/12/22 0121  NA 137 138 139  K 4.3 4.0 3.5  CL 101  --  104  CO2 22  --  21*  GLUCOSE 146*  --  155*  BUN 28*  --  29*  CREATININE 3.00*  --  3.02*  CALCIUM 9.7  --  9.5  MG 2.3  --  2.3   GFR: Estimated Creatinine Clearance: 20.5 mL/min (A) (by C-G formula based on SCr of 3.02 mg/dL (H)). Recent Labs  Lab 07/11/22 1352 07/11/22 2259 07/12/22 0121 07/12/22 0131  PROCALCITON  --  0.22  --   --   WBC 13.4*  --  7.6  --   LATICACIDVEN  --  1.2  --  1.1   Liver Function Tests: Recent Labs  Lab 07/11/22 1352  AST 27  ALT 20  ALKPHOS 150*  BILITOT 0.8  PROT 7.3  ALBUMIN 4.0   No results for input(s): "LIPASE", "AMYLASE" in the last 168 hours. No results for input(s): "AMMONIA" in the last 168 hours. ABG    Component Value Date/Time   PHART 7.290 (L) 07/11/2022 1435   PCO2ART 63.4  (H) 07/11/2022 1435   PO2ART 227 (H) 07/11/2022 1435   HCO3 30.4 (H) 07/11/2022 1435   TCO2 32 07/11/2022 1435   ACIDBASEDEF 2.0 04/15/2022 1843   O2SAT 100 07/11/2022 1435    Coagulation Profile: Recent Labs  Lab 07/11/22 1352  INR 1.7*   Cardiac Enzymes: No results for input(s): "CKTOTAL", "CKMB", "CKMBINDEX", "TROPONINI" in the last 168 hours. HbA1C: Hgb A1c MFr Bld  Date/Time Value Ref Range Status  04/17/2022 03:54 AM 5.8 (H) 4.8 - 5.6 % Final    Comment:    (NOTE) Pre diabetes:          5.7%-6.4%  Diabetes:              >6.4%  Glycemic control for   <7.0% adults with diabetes   12/26/2017 04:21 AM 5.6 4.8 - 5.6 %  Final    Comment:    (NOTE) Pre diabetes:          5.7%-6.4% Diabetes:              >6.4% Glycemic control for   <7.0% adults with diabetes    CBG: Recent Labs  Lab 07/11/22 1753 07/11/22 1929 07/11/22 2358 07/12/22 0400 07/12/22 0745  GLUCAP 133* 143* 156* 151* 133*  App cct 34 min  Richardson Landry Corbett Moulder ACNP Acute Care Nurse Practitioner [place] Please consult Amion 07/12/2022, 8:54 AM

## 2022-07-12 NOTE — Progress Notes (Signed)
[place] for apixaban>> heparin Indication: atrial fibrillation  Allergies  Allergen Reactions   Ipratropium Other (See Comments)   Metoprolol     Was bringing blood pressure too low and he would pass out.   Morphine And Related Nausea Only   Vicodin [Hydrocodone-Acetaminophen] Other (See Comments)    Hallucinations    Penicillins Rash    Did it involve swelling of the face/tongue/throat, SOB, or low BP? No Did it involve sudden or severe rash/hives, skin peeling, or any reaction on the inside of your mouth or nose? Yes Did you need to seek medical attention at a hospital or doctor's office? No When did it last happen? Over 7 Years Ago      If all above answers are "NO", may proceed with cephalosporin use.      Patient Measurements: Height: 5\' 9"  (175.3 cm) Weight: 82 kg (180 lb 12.4 oz) IBW/kg (Calculated) : 70.7 Heparin Dosing Weight: 82 kg   Vital Signs: Temp: 97.5 F (36.4 C) (07/18 1940) Temp Source: Axillary (07/18 1940) BP: 110/47 (07/18 2100) Pulse Rate: 61 (07/18 2100)  Labs: Recent Labs    07/11/22 1352 07/11/22 1435 07/11/22 2259 07/12/22 0121 07/12/22 0618 07/12/22 1033 07/12/22 1621 07/12/22 2102  HGB 10.8* 10.5*  --  9.8*  --  10.5*  --   --   HCT 35.0* 31.0*  --  30.3*  --  31.0*  --   --   PLT 352  --   --  222  --   --   --   --   APTT  --   --   --   --   --   --   --  92*  LABPROT 20.2*  --   --   --   --   --   --   --   INR 1.7*  --   --   --   --   --   --   --   CREATININE 3.00*  --   --  3.02*  --   --  3.31*  --   TROPONINIHS 21*  --  833* 990* 1,166*  --   --   --      Estimated Creatinine Clearance: 18.7 mL/min (A) (by C-G formula based on SCr of 3.31 mg/dL (H)).   Assessment: 37 yom presenting with SOB - on apixaban PTA for hx Afib. Last dose on 7/17 at 2137.   Hgb 9.8, plt 222. Trop 87>8676. Will change to heparin while Scr up and in case of need for procedure. Of note, has some  bleeding from mouth - improved. Given recent DOAC, will monitor both aPTT and heparin level until correlate.   aPTT 92 sec (therapeutic) on infusion at 1150 units/hr.  Goal of Therapy:  Heparin level 0.3-0.7 units/ml aPTT 66-102 seconds Monitor platelets by anticoagulation protocol: Yes   Plan:  Continue heparin infusion at 1150 units/hr  Monitor daily HL/aPTT until correlate, CBC, and for s/sx of bleeding  Sherlon Handing, PharmD, BCPS Please see amion for complete clinical pharmacist phone list 07/12/2022 9:35 PM

## 2022-07-12 NOTE — Progress Notes (Signed)
eLink Physician-Brief Progress Note [name] DOB: March 12, 1946 MRN: [id number]   Date of Service  07/12/2022  HPI/Events of Note  Troponin still rising, currently 990.  eICU Interventions  Will continue trend Troponin until it peaks and starts to trend downwards.        Kerry Kass Constantine Ruddick 07/12/2022, 2:48 AM

## 2022-07-12 NOTE — Consult Note (Incomplete)
Cardiology Consultation:   Patient ID: Joe Wiggins MRN: [id number]; DOB: 1946/04/03  Admit date: 07/11/2022 Date of Consult: 07/12/2022  PCP:  Charlaine Dalton, [facility] Providers Cardiologist:  Donato Heinz, MD  Cardiology APP:  Ledora Bottcher, [place]  Electrophysiologist:  Cristopher Peru, MD    Patient Profile:   Joe Wiggins is a 76 y.o. male with a hx of ICM, CAD s/p CABG, AFib, CKD, COPD, HTN, OSA, dementia and prior TIA, sinus node dysfunction PPM, CKD (IV) Jan 2019 hospitalized with a pneumonia, during this stay he suffered a VF arrest, underwent cath >> patent grafts, LVEF 40-45%, no interventions, his PPM upgraded to ICD. who is being seen 07/12/2022 for the evaluation of SVT at the request of Dr. Ellyn Hack.   Device information SJM dual chamber ICD, (PPM upgraded) 12/29/17, RA lead is from 2014, he has an abandoned RV pacing lead Secondary prevention    History of Present Illness:   [name] last saw Dr Lovena Le Feb 2022, hed discussed a syncopal event associated with VF successfully treated.  Historically the pt felt poorly with amiodarone, and discussed if arrhythmia burden increased would consider sotalol  There is also mention that amiodarone stopped 2/2 hyperthyroidism  He had a CP admission March this year, NSTEMI with HS Trops 20 > 633 > 4110 > 2636, cath deferred 2/2 renal function, and pursued a conservative/medical management approach.  Hospitalized 4/21 - 04/18/2022 with acute on chronic combined heart failure and acute on chronic respiratory failure.  He was diuresed and discharged on increased dose of torsemide 60 mg daily.  He was hypotensive on 50 mg hydralazine twice daily and was discharged without this.  No SGLT2i, ACE, ARB, ARNI due to renal function. He was seen in AHF clinic 04/27/22 with ReDS 36%. He was increased to 60 mg torsemide BID x 2 days then back to 40 mg BID. Imdur 15 mg and 10 mg hydralazine TID were also added  He saw  cardiology team APP 07/04/22, discussed a monitor that noted numerous SVT episodes, longest 32 min, planned to refer to EP.  Felt the SVTs were etiology of his fatigue as well as sedentary lifestyle Following with nephrology  Admitted yesterday with progressive SOB, fatigue, perhaps over the last month, SOB became acutely worse and EMS called, treated initially with NEB and then CPAP but was intolerant and on arrival to the ER being ventilated by BVM and promptly intubated in the ER with sats in the 80's, HR 130s by notes.  CXR with edema vs pneumonia, WBC 13, started on abx  Today coreg > amiodarone for "AFib w/RVR" by notes CCM Failed vent wean and remained on full vent support Felt Likely COPD, HF picture Cardiology consulted.  EP asked on board for SVTs  LABS K+ 4.3, 4.0, 3.5, 3.9 Mag 2.3, 2.5 BUN/Creat 28/3.00 > 3.02, baseline probably about 2.5 HS Trop 21 > 833 > 990 > 1166 WBC 13 > 7.6 H/H 10.5/31 Plts 222  Pt is intubated and sedated. Wife is at bedside Reports that he has been having trouble for a few months, markedly fatigued and of lat very SOB, unable to walk even a few feet without getting SOB  Amiodarone  RECORDS REVIEWED had a low TSH 2021/22 timeframe but has not been seen from although never really strikingly so.  The chart describes amiodarone having been stopped because of issues, feeling bad, as well as hyperthyroidism.     Past Medical History:  Diagnosis Date  Abrasion of forearm, right 07/27/2019   Acute respiratory failure (HCC)    AF (paroxysmal atrial fibrillation) ([place])    AICD (automatic cardioverter/defibrillator) present    CAD (coronary artery disease)    Cardiac arrest ([place])    Cardiomyopathy, ischemic    Carotid artery occlusion    Chronic HFrEF (heart failure with reduced ejection fraction) (HCC)    Chronic respiratory failure (HCC)    CKD (chronic kidney disease), stage IV (HCC)    COPD (chronic obstructive pulmonary disease) ([place])     Cough 11/13/2019   Dementia without behavioral disturbance (HCC)    Dizziness 01/17/2016   Facial rash 08/15/2018   Frequent PVCs    GERD (gastroesophageal reflux disease)    Hyperlipidemia    Hypertension    ICD (implantable cardioverter-defibrillator) in place    Ischemic cardiomyopathy    Paroxysmal atrial flutter (HCC)    Pre-syncope 01/16/2018   Premature atrial contractions    PSVT (paroxysmal supraventricular tachycardia) ([place])    Respiratory distress 12/27/2020   Sleep apnea    Stroke ([place])    TIA (transient ischemic attack)     Past Surgical History:  Procedure Laterality Date   CARDIAC PACEMAKER PLACEMENT     CATARACT EXTRACTION     CORONARY ARTERY BYPASS GRAFT     CORONARY STENT PLACEMENT     EYE SURGERY     ICD IMPLANT N/A 12/29/2017   Procedure: ICD IMPLANT;  Surgeon: Evans Lance, MD;  Location: [facility];  Service: Cardiovascular;  Laterality: N/A;   LEFT HEART CATH AND CORS/GRAFTS ANGIOGRAPHY N/A 12/29/2017   Procedure: LEFT HEART CATH AND CORS/GRAFTS ANGIOGRAPHY;  Surgeon: Martinique, Peter M, MD;  Location: [facility];  Service: Cardiovascular;  Laterality: N/A;     Home Medications:  Prior to Admission medications   Medication Sig Start Date End Date Taking? Authorizing Provider  albuterol (PROVENTIL HFA;VENTOLIN HFA) 108 (90 Base) MCG/ACT inhaler Inhale 2 puffs into the lungs every 6 (six) hours as needed for wheezing or shortness of breath. 06/30/16  Yes [provider]  allopurinol (ZYLOPRIM) 100 MG tablet Take 100 mg by mouth daily.   Yes [provider]  aspirin EC 81 MG tablet Take 1 tablet (81 mg total) by mouth daily. Swallow whole. 03/22/22  Yes Simmons, Brittainy M, PA-C  atorvastatin (LIPITOR) 40 MG tablet TAKE 1 TABLET EVERY DAY Patient taking differently: Take 40 mg by mouth daily. 01/24/22  Yes Evans Lance, MD  carvedilol (COREG) 25 MG tablet TAKE 2 TABLETS(50 MG) BY MOUTH EVERY MORNING AND 1 AND 1/2 TABLETS(37.5  MG) BY MOUTH EVERY EVENING Patient taking differently: Take 37.5-50 mg by mouth See admin instructions. TAKE 2 TABLETS(50 MG) BY MOUTH EVERY MORNING AND 1 AND 1/2 TABLETS(37.5 MG) BY MOUTH EVERY EVENING 05/06/22  Yes Donato Heinz, MD  cetirizine (ZYRTEC) 10 MG tablet Take 10 mg by mouth daily.   Yes [provider]  cholecalciferol (VITAMIN D) 1000 units tablet Take 1,000 Units by mouth daily.   Yes [provider]  Cyanocobalamin (VITAMIN B 12 PO) Take 1,000 mcg by mouth daily.   Yes [provider]  ELIQUIS 5 MG TABS tablet TAKE 1 TABLET TWICE DAILY Patient taking differently: Take 5 mg by mouth 2 (two) times daily. 06/06/22  Yes Donato Heinz, MD  ferrous sulfate 325 (65 FE) MG tablet Take 325 mg by mouth daily with breakfast. 01/03/18  Yes [provider]  folic acid (FOLVITE) 510 MCG  tablet Take 400 mcg by mouth daily.   Yes [provider]  hydrALAZINE (APRESOLINE) 10 MG tablet Take 1 tablet (10 mg total) by mouth 3 (three) times daily. 05/06/22  Yes Donato Heinz, MD  isosorbide mononitrate (IMDUR) 30 MG 24 hr tablet Take 0.5 tablets (15 mg total) by mouth daily. 05/06/22  Yes Donato Heinz, MD  JARDIANCE 10 MG TABS tablet Take 10 mg by mouth daily. 05/11/22  Yes [provider]  Multiple Vitamin (MULTIVITAMIN) tablet Take 1 tablet by mouth daily.   Yes [provider]  nitroGLYCERIN (NITROSTAT) 0.4 MG SL tablet PLACE 1 TABLET (0.4 MG TOTAL) UNDER THE TONGUE EVERY 5 (FIVE) MINUTES AS NEEDED FOR CHEST PAIN. 01/24/22  Yes Evans Lance, MD  torsemide (DEMADEX) 20 MG tablet Take 40 mg by mouth 2 (two) times daily.   Yes [provider]  TRELEGY ELLIPTA 100-62.5-25 MCG/ACT AEPB Inhale 1 puff into the lungs daily. 01/21/22  Yes [provider]  valACYclovir (VALTREX) 1000 MG tablet Take 1,000 mg by mouth in the morning and at bedtime. Patient not taking: Reported on 07/11/2022 05/20/22    [provider]    Inpatient Medications: Scheduled Meds:  allopurinol  100 mg Per Tube Daily   arformoterol  15 mcg Nebulization BID   aspirin  81 mg Per Tube Daily   atorvastatin  40 mg Per Tube Daily   budesonide (PULMICORT) nebulizer solution  0.5 mg Nebulization BID   carvedilol  25 mg Per Tube BID   Chlorhexidine Gluconate Cloth  6 each Topical Daily   doxazosin  1 mg Per Tube Daily   feeding supplement (PROSource TF)  45 mL Per Tube TID   furosemide  80 mg Intravenous Q8H   insulin aspart  0-9 Units Subcutaneous Q4H   ipratropium-albuterol  3 mL Nebulization Q6H   loratadine  10 mg Per Tube Daily   methylPREDNISolone (SOLU-MEDROL) injection  120 mg Intravenous Q0600   mouth rinse  15 mL Mouth Rinse Q2H   pantoprazole sodium  40 mg Per Tube Daily   revefenacin  175 mcg Nebulization Daily   Continuous Infusions:  amiodarone 60 mg/hr (07/12/22 1400)   amiodarone     doxycycline (VIBRAMYCIN) IV 125 mL/hr at 07/12/22 1400   feeding supplement (VITAL 1.5 CAL) 30 mL/hr at 07/12/22 1400   fentaNYL infusion INTRAVENOUS 75 mcg/hr (07/12/22 1400)   heparin 1,150 Units/hr (07/12/22 1400)   propofol (DIPRIVAN) infusion 5 mcg/kg/min (07/12/22 1400)   PRN Meds: acetaminophen, albuterol, docusate, fentaNYL, hydrALAZINE, ondansetron (ZOFRAN) IV, mouth rinse, polyethylene glycol  Allergies:    Allergies  Allergen Reactions   Ipratropium Other (See Comments)   Metoprolol     Was bringing blood pressure too low and he would pass out.   Morphine And Related Nausea Only   Vicodin [Hydrocodone-Acetaminophen] Other (See Comments)    Hallucinations    Penicillins Rash    Did it involve swelling of the face/tongue/throat, SOB, or low BP? No Did it involve sudden or severe rash/hives, skin peeling, or any reaction on the inside of your mouth or nose? Yes Did you need to seek medical attention at a hospital or doctor's office? No When did it last happen? Over 7 Years Ago       If all above answers are "NO", may proceed with cephalosporin use.      Social History:   Social History   Socioeconomic History   Marital status: Married    Spouse name: [name]  [place]   Number of children: 2   Years of education: Not on file   Highest education level: 11th grade  Occupational History   Occupation: Retired    Comment: Financial planner  Tobacco Use   Smoking status: Former    Types: Cigarettes    Quit date: 11/30/2012    Years since quitting: 9.6   Smokeless tobacco: Never  Vaping Use   Vaping Use: Never used  Substance and Sexual Activity   Alcohol use: No    Comment: last drink 10 + years   Drug use: No   Sexual activity: Not on file  Other Topics Concern   Not on file  Social History Narrative   Not on file   Social Determinants of Health   Financial Resource Strain: Low Risk  (03/10/2022)   Overall Financial Resource Strain (CARDIA)    Difficulty of Paying Living Expenses: Not hard at all  Food Insecurity: No Food Insecurity (03/10/2022)   Hunger Vital Sign    Worried About Running Out of Food in the Last Year: Never true    [place] in the Last Year: Never true  Transportation Needs: No Transportation Needs (03/10/2022)   PRAPARE - Hydrologist (Medical): No    Lack of Transportation (Non-Medical): No  Physical Activity: Not on file  Stress: Not on file  Social Connections: Not on file  Intimate Partner Violence: Not on file    Family History:   Family History  Problem Relation Age of Onset   Hypertension Mother    Heart disease Mother    Hypertension Sister    Heart disease Sister    Thyroid disease Sister    Thyroid disease Brother        hyperthyroid     ROS:  Please see the history of present illness.  All other ROS reviewed and negative.     Physical Exam/Data:   Vitals:   07/12/22 1315 07/12/22 1330 07/12/22 1345 07/12/22 1400  BP: 131/70 131/74 137/71 132/75  Pulse: (!) 114 (!) 113  (!) 112 (!) 112  Resp: (!) 24 (!) 24 18 (!) 24  Temp:      TempSrc:      SpO2: 96% 96% 98% 97%  Weight:      Height:        Intake/Output Summary (Last 24 hours) at 07/12/2022 1503 Last data filed at 07/12/2022 1400 Gross per 24 hour  Intake 1119.95 ml  Output 1725 ml  Net -605.05 ml      07/12/2022    5:00 AM 07/11/2022    5:30 PM 07/04/2022    1:20 PM  Last 3 Weights  Weight (lbs) 180 lb 12.4 oz 180 lb 5.4 oz 186 lb 3.2 oz  Weight (kg) 82 kg 81.8 kg 84.46 kg     Body mass index is 26.7 kg/m.  General:  Well nourished, well developed HEENT: normal Neck: no JVD Vascular: No carotid bruits Cardiac:  RRR; soft SM, no gallops or rubs Lungs: CTA b/l, on vent Abd: soft, not distended  Ext: no edema Musculoskeletal:  No deformities Skin: warm and dry  Neuro:  unable to assess Psych:  unable to assess  EKG:  The EKG was personally reviewed and demonstrates:    SR 92bpm,  Suspect a ST 113bpm vs AT  Telemetry:  Telemetry was personally reviewed and demonstrates:  looks like he is in/out of an ATach rates 110s  Relevant CV Studies:  Monitor  may-June 2023 Patch Wear Time:  8 days and 4 hours (2023-05-08T13:37:46-0400 to 2023-06-01T10:10:26-0400)  Monitor 1 1. Sinus rhythm - avg HR of 117 bpm. (average rate in sinus was 75 bpm) 2. 20 runs of Supraventricular Tachycardia occurred, the run with the fastest interval lasting 44 mins 18 secs with a max rate of 152 bpm,the longest lasting 1 hour 10 mins with an avg rate of 120 bpm 3. Isolated PACs were rare (<1.0%) 4. Isolated PVCs were occasional (2.1%, 1024) 5. No patient-triggered events reported   Monitor 2 1. Sinus rhythm - avg HR of 73 bpm.  2. 1723 runs of Supraventricular Tachycardia occurred, the run with the fastest interval lasting 8 mins 22 secs with a max rate  of 207 bpm, the longest lasting 32 mins 13 secs with an avg rate of 125 bpm. 3. Isolated PACs were occasional (3.3%, 18563) 4.  Isolated PCss were frequent  (6.0%, 51417) 5. Ventricular Bigeminy and Trigeminy were present. Echocardiogram 03/08/22   1. Left ventricular ejection fraction, by estimation, is 40 to 45%. The  left ventricle has mildly decreased function. The left ventricle  demonstrates regional wall motion abnormalities (see scoring  diagram/findings for description). Left ventricular  diastolic parameters are consistent with Grade I diastolic dysfunction  (impaired relaxation).   2. Right ventricular systolic function is normal. The right ventricular  size is normal. There is normal pulmonary artery systolic pressure.   3. Left atrial size was mildly dilated.   4. Right atrial size was mildly dilated.   5. The mitral valve is normal in structure. Trivial mitral valve  regurgitation.   6. Tricuspid valve regurgitation is mild to moderate.   7. The aortic valve is tricuspid. Aortic valve regurgitation is not  visualized.   8. The inferior vena cava is normal in size with greater than 50%  respiratory variability, suggesting right atrial pressure of 3 mmHg.   Comparison(s): No significant change from prior study. Prior images  reviewed side by side.   12/29/2017: LHC Mid LM lesion is 75% stenosed. Ost LAD to Prox LAD lesion is 100% stenosed. Ost 1st Mrg lesion is 100% stenosed. Prox Cx to Mid Cx lesion is 100% stenosed. Prox RCA to Mid RCA lesion is 100% stenosed. LIMA graft was visualized by angiography and is normal in caliber. The graft exhibits no disease. Seq SVG- OM 1 and PDA graft was visualized by angiography and is normal in caliber. SVG graft was visualized by angiography and is normal in caliber. Origin lesion before 1st Mrg is 50% stenosed. LV end diastolic pressure is moderately elevated.   1. Severe 3 vessel occlusive CAD 2. Patent LIMA to the LAD 3. Patent sequential SVG to the first OM and PDA. Patent Y graft SVG to the diagonal 4. Moderately elevated LVEDP of 30 mm Hg.   Plan: medical management. Diuresis  per primary team.      12/27/2017 Study Conclusions - Left ventricle: The cavity size was normal. There was mild   concentric hypertrophy. Systolic function was mildly to   moderately reduced. The estimated ejection fraction was in the   range of 40% to 45%. Features are consistent with a pseudonormal   left ventricular filling pattern, with concomitant abnormal   relaxation and increased filling pressure (grade 2 diastolic   dysfunction). Doppler parameters are consistent with elevated   mean left atrial filling pressure. - Aortic valve: There was no regurgitation. - Mitral valve: There was trivial regurgitation. - Right ventricle: The cavity size was  mildly dilated. Wall   thickness was normal. Pacer wire or catheter noted in right   ventricle. Systolic function was normal. - Right atrium: Pacer wire or catheter noted in right atrium. - Tricuspid valve: There was mild regurgitation. - Pulmonary arteries: Systolic pressure was moderately increased.   PA peak pressure: 53 mm Hg (S). - Inferior vena cava: The vessel was normal in size. - Pericardium, extracardiac: There was no pericardial effusion.    Impressions:   - Since the prior study on 01/18/2016 LVEF has decreased from 50-55%   to 40-45% with basal and mid inferolateral and anterolateral   hypokinesis.    Laboratory Data:  High Sensitivity Troponin:   Recent Labs  Lab 07/11/22 1352 07/11/22 2259 07/12/22 0121 07/12/22 0618  TROPONINIHS 21* 833* 990* 1,166*     Chemistry Recent Labs  Lab 07/11/22 1352 07/11/22 1435 07/12/22 0121 07/12/22 1033 07/12/22 1326  NA 137 138 139 139  --   K 4.3 4.0 3.5 3.9  --   CL 101  --  104  --   --   CO2 22  --  21*  --   --   GLUCOSE 146*  --  155*  --   --   BUN 28*  --  29*  --   --   CREATININE 3.00*  --  3.02*  --   --   CALCIUM 9.7  --  9.5  --   --   MG 2.3  --  2.3  --  2.5*  GFRNONAA 21*  --  21*  --   --   ANIONGAP 14  --  14  --   --     Recent Labs  Lab  07/11/22 1352  PROT 7.3  ALBUMIN 4.0  AST 27  ALT 20  ALKPHOS 150*  BILITOT 0.8   Lipids  Recent Labs  Lab 07/12/22 0121  TRIG 191*    Hematology Recent Labs  Lab 07/11/22 1352 07/11/22 1435 07/12/22 0121 07/12/22 1033  WBC 13.4*  --  7.6  --   RBC 3.43*  --  3.20*  --   HGB 10.8* 10.5* 9.8* 10.5*  HCT 35.0* 31.0* 30.3* 31.0*  MCV 102.0*  --  94.7  --   MCH 31.5  --  30.6  --   MCHC 30.9  --  32.3  --   RDW 16.9*  --  16.5*  --   PLT 352  --  222  --    Thyroid No results for input(s): "TSH", "FREET4" in the last 168 hours.  BNP Recent Labs  Lab 07/11/22 1352  BNP 777.0*    DDimer No results for input(s): "DDIMER" in the last 168 hours.   Radiology/Studies:  DG Abd Portable 1V Result Date: 07/11/2022 CLINICAL DATA:  Orogastric tube placement EXAM: PORTABLE ABDOMEN - 1 VIEW COMPARISON:  12/26/2017 FINDINGS: Orogastric tube enters the stomach with its tip in the mid body. Gas pattern unremarkable. IMPRESSION: Tube tip in the mid body of the stomach. Electronically Signed   By: Nelson Chimes M.D.   On: 07/11/2022 18:05   DG Chest Portable 1 View Result Date: 07/11/2022 CLINICAL DATA:  A 76 year old male presents post intubation for assessment. EXAM: PORTABLE CHEST 1 VIEW COMPARISON:  April 15, 2022 FINDINGS: Endotracheal tube in place 7.2 cm from the carina, tip between clavicular heads. Gastric tube courses through in off the field the radiograph into the upper abdomen. Multi lead pacer device in place, power pack over  the LEFT chest. EKG leads over the chest. Post median sternotomy. Stable cardiomediastinal contours. Diffuse interstitial and alveolar opacities throughout both LEFT and RIGHT chest slightly worse in the RIGHT lower chest. No lobar consolidation. Blunting of LEFT costodiaphragmatic sulcus no pneumothorax. On limited assessment there is no acute skeletal process. IMPRESSION: 1. Endotracheal tube 7.2 cm from the carina. 2. Diffuse interstitial and alveolar  opacities throughout both lungs, slightly worse in the RIGHT lower chest. Pattern of disease could reflect asymmetric pulmonary edema or atypical/multifocal infection 3. Question small left effusion. 4. Gastric tube courses through off the field the radiograph into the upper abdomen. Electronically Signed   By: Zetta Bills M.D.   On: 07/11/2022 14:12     Assessment and Plan:   SVT Suspect an ATach Baseline TSH  today Amiodarone the only option currently,   Unclear if the ATach is 2/2 respiratory status, though wore a monitor may-June with some SVTs as well Not certain is the cause of his HF, doesn't go very fast here though outpt monitor with some rates into 120's  AFib CHA2DS2Vasc is 6, on Eliquis outpt transitioned to heparin gtt here This isn't AFib Will have his device checked for burden and more definitive evaluation of his tachycardia  Dr. Lovena Le will see later today  Further with CCM and attending cardiology team     Risk Assessment/Risk Scores:  {  For questions or updates, please contact [facility] Please consult [email or web address] for contact info under    Signed, Baldwin Jamaica, PA-C  07/12/2022 3:03 PM  Atrial tachycardia  Cardiac arrest-remote  ICD-dual-chamber-[employer]  Respiratory failure question mechanism  COPD  Heart failure acute/chronic--systolic/diastolic  Coronary artery disease with prior non-STEMI 3/23 Deferred  Anemia   Renal insufficiency  Estimated Creatinine Clearance: 17.5 mL/min (A) (by C-G formula based on SCr of 3.54 mg/dL (H)).  History of amiodarone use with concomitant hyperthyroidism  The patient has atrial tachycardia.  I do not think it is enough to be triggering these events, at heart rates of only the 110-120 range although the burden at 10% may be contributing to left ventricular dysfunction which is albeit mild and stable over the last number of years.  He has had repeated hospitalizations for respiratory issues  and I suspect that these are 2 cooccuring events and that any consequential relationship is that the tachycardia is secondary to the aggravation of his cardiopulmonary status.  Hence, we will continue amiodarone.  With his left ventricular dysfunction and renal dysfunction there are not great antiarrhythmic options for him, and would transition the amiodarone to p.o. when appropriate and would reload at 400 twice daily x2 weeks then 200 twice daily x2 weeks then 200 mg a day  Review of his device demonstrates atrial fibrillation detected in 2019-2020 timeframe; none since.  This would suffice to justify ongoing anticoagulation

## 2022-07-12 NOTE — Consult Note (Addendum)
Cardiology Consultation:   Patient ID: Joe Wiggins MRN: [id number]; DOB: [birth year]  Admit date: 07/11/2022 Date of Consult: 07/12/2022  PCP:  Charlaine Dalton, [facility] Providers Cardiologist:  Donato Heinz, MD  Cardiology APP:  Ledora Bottcher, [place]  Electrophysiologist:  Cristopher Peru, MD       Patient Profile:   Joe Wiggins is a 76 y.o. 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 Carlis Abbott.  History of Present Illness:   Joe Wiggins has complex PMH as above and has followed with general cardiology and EP; also seen by [facility] team before. He had remote CABG in 2014 at [facility] as well as prior PPM. He was remotely followed by Dr. Wynonia Lawman then was seen by [facility] first in 2019 when he suffered a torsades cardiac arrest in setting of PNA. Cath showed patent grafts and his device was upgraded to a St. Jude ICD. He was admitted in 02/2022 in setting of a/c CHF with NSTEMI (trop peak 4.3k) managed medically given advanced CKD. He required readmission 03/2022 for decompensated HF again. He also has a history of paroxysmal atrial fib/flutter with last recurrence around in the spring of this year prompting switch from labetalol to carvedilol with subsequent titration. Last echo 02/2022 showed EF 40-45%, G1DD, normal RV, mild BAE, mild-moderate TR. F/u monitor 04/2022 showed avg HR 73bpm, 1723 runs of PSVT (longest 1min), 3.3% PACs, 6.0% PVCs. At last visit 07/04/22, it was suggested he follow up with EP for his SVT. There was a phone note 7/11 where family called in  with concern for slow HR but given he has a device this was felt erroneous, interrogation showed normal device function - NSR with PVCs only, likely not picking up on external pulse ox/BP monitor.  He was BIBEMS yesterday afternoon with worsening SOB that was initially with exertion then progresesd to rest. Per his wife at bedside, he has had a tough time this past year. He's had progressive loss of appetite, worsening fatigue and endurance with DOE. Wife otherwise denies any recent significant chest pain, weight changes or bleeding. Yesterday his breathing became progressively worse just resting so they called 911. Upon EMS arrival he was A+Ox4 that decreased as his respiratory status declined. He was placed on NRB, then CPAP which he did not tolerate, and was bagged on arrival to the ED. He was subsequently intubated. CXR showed diffuse interstitial and alveolar opacities throughout both lungs, slightly worse in the right, question pulm edema vs atypical infection, possible small left pleural effusion. Labs notable for BNP 777, hsTroponin [phone], leukocytosis 13k, anemia ~9-10 range, Cr 3, pH 7.290, procalcitonin 0.29, Covid swab pending. He's receiving tx with nebs, steroids, abx, heparin (in lieu of home Eliquis), and planned for IV Lasix 80mg  TID. He did not tolerate vent weaning yet. Was hypertensive earlier on in admission. Cardiology asked to consult regarding CHF. Last office weight 186lb, here 180lb. He was in NSR on admission EKG but per telemetry review has intermittently gone into a narrow complex tachycardia in the 1teens-120s (with abrupt onset/offset from sinus  in the 60s-70s) suggestive of recurrent atrial flutter vs SVT though appearance looks like sinus tach on telemetry. Carvedilol was initially continued at lower 12.5mg  BID dosing then discontinued in lieu of using amiodarone drip @ 60/hr presently (carvedilol dose was 50mg  QAM, 37.5mg  QPM). Pharmacy is following his heparin. He has  had some bleeding from his mouth.    Past Medical History:  Diagnosis Date   Abrasion of forearm, right 07/27/2019   Acute respiratory failure (HCC)    AF (paroxysmal atrial fibrillation) (HCC)    AICD (automatic cardioverter/defibrillator) present    CAD (coronary artery disease)    Cardiac arrest ([place])    Cardiomyopathy, ischemic    Carotid artery occlusion    Chronic HFrEF (heart failure with reduced ejection fraction) (HCC)    Chronic respiratory failure (HCC)    CKD (chronic kidney disease), stage IV (HCC)    COPD (chronic obstructive pulmonary disease) ([place])    Cough 11/13/2019   Dementia without behavioral disturbance (HCC)    Dizziness 01/17/2016   Facial rash 08/15/2018   Frequent PVCs    GERD (gastroesophageal reflux disease)    Hyperlipidemia    Hypertension    ICD (implantable cardioverter-defibrillator) in place    Ischemic cardiomyopathy    Paroxysmal atrial flutter (HCC)    Pre-syncope 01/16/2018   Premature atrial contractions    PSVT (paroxysmal supraventricular tachycardia) ([place])    Respiratory distress 12/27/2020   Sleep apnea    Stroke ([place])    TIA (transient ischemic attack)     Past Surgical History:  Procedure Laterality Date   CARDIAC PACEMAKER PLACEMENT     CATARACT EXTRACTION     CORONARY ARTERY BYPASS GRAFT     CORONARY STENT PLACEMENT     EYE SURGERY     ICD IMPLANT N/A 12/29/2017   Procedure: ICD IMPLANT;  Surgeon: Evans Lance, MD;  Location: [facility];  Service: Cardiovascular;  Laterality: N/A;   LEFT HEART CATH AND CORS/GRAFTS ANGIOGRAPHY N/A 12/29/2017   Procedure: LEFT HEART CATH AND CORS/GRAFTS ANGIOGRAPHY;  Surgeon: Martinique, Peter M, MD;  Location: [facility];  Service: Cardiovascular;  Laterality: N/A;     Home Medications:  Prior to Admission medications   Medication Sig Start Date End Date Taking? Authorizing Provider  albuterol (PROVENTIL HFA;VENTOLIN HFA) 108 (90 Base) MCG/ACT inhaler Inhale 2 puffs into the  lungs every 6 (six) hours as needed for wheezing or shortness of breath. 06/30/16  Yes [provider]  allopurinol (ZYLOPRIM) 100 MG tablet Take 100 mg by mouth daily.   Yes [provider]  aspirin EC 81 MG tablet Take 1 tablet (81 mg total) by mouth daily. Swallow whole. 03/22/22  Yes Simmons, Brittainy M, PA-C  atorvastatin (LIPITOR) 40 MG tablet TAKE 1 TABLET EVERY DAY Patient taking differently: Take 40 mg by mouth daily. 01/24/22  Yes Evans Lance, MD  carvedilol (COREG) 25 MG tablet TAKE 2 TABLETS(50 MG) BY MOUTH EVERY MORNING AND 1 AND 1/2 TABLETS(37.5 MG) BY MOUTH EVERY EVENING Patient taking differently: Take 37.5-50 mg by mouth See admin instructions. TAKE 2 TABLETS(50 MG) BY MOUTH EVERY MORNING AND 1 AND 1/2 TABLETS(37.5 MG) BY MOUTH EVERY EVENING 05/06/22  Yes Donato Heinz, MD  cetirizine (ZYRTEC) 10 MG tablet Take 10 mg by mouth daily.   Yes [provider]  cholecalciferol (VITAMIN D) 1000 units tablet Take 1,000 Units by mouth daily.   Yes [provider]  Cyanocobalamin (VITAMIN B 12 PO) Take  1,000 mcg by mouth daily.   Yes [provider]  ELIQUIS 5 MG TABS tablet TAKE 1 TABLET TWICE DAILY Patient taking differently: Take 5 mg by mouth 2 (two) times daily. 06/06/22  Yes Donato Heinz, MD  ferrous sulfate 325 (65 FE) MG tablet Take 325 mg by mouth daily with breakfast. 01/03/18  Yes [provider]  folic acid (FOLVITE) 094 MCG tablet Take 400 mcg by mouth daily.   Yes [provider]  hydrALAZINE (APRESOLINE) 10 MG tablet Take 1 tablet (10 mg total) by mouth 3 (three) times daily. 05/06/22  Yes Donato Heinz, MD  isosorbide mononitrate (IMDUR) 30 MG 24 hr tablet Take 0.5 tablets (15 mg total) by mouth daily. 05/06/22  Yes Donato Heinz, MD  JARDIANCE 10 MG TABS tablet Take 10 mg by mouth daily. 05/11/22  Yes [provider]  Multiple Vitamin (MULTIVITAMIN) tablet Take 1 tablet  by mouth daily.   Yes [provider]  nitroGLYCERIN (NITROSTAT) 0.4 MG SL tablet PLACE 1 TABLET (0.4 MG TOTAL) UNDER THE TONGUE EVERY 5 (FIVE) MINUTES AS NEEDED FOR CHEST PAIN. 01/24/22  Yes Evans Lance, MD  torsemide (DEMADEX) 20 MG tablet Take 40 mg by mouth 2 (two) times daily.   Yes [provider]  TRELEGY ELLIPTA 100-62.5-25 MCG/ACT AEPB Inhale 1 puff into the lungs daily. 01/21/22  Yes [provider]  valACYclovir (VALTREX) 1000 MG tablet Take 1,000 mg by mouth in the morning and at bedtime. Patient not taking: Reported on 07/11/2022 05/20/22   [provider]    Inpatient Medications: Scheduled Meds:  allopurinol  100 mg Per Tube Daily   arformoterol  15 mcg Nebulization BID   aspirin  81 mg Per Tube Daily   atorvastatin  40 mg Per Tube Daily   budesonide (PULMICORT) nebulizer solution  0.5 mg Nebulization BID   Chlorhexidine Gluconate Cloth  6 each Topical Daily   doxazosin  1 mg Per Tube Daily   feeding supplement (PROSource TF)  45 mL Per Tube TID   furosemide  80 mg Intravenous Q8H   insulin aspart  0-9 Units Subcutaneous Q4H   ipratropium-albuterol  3 mL Nebulization Q6H   loratadine  10 mg Per Tube Daily   methylPREDNISolone (SOLU-MEDROL) injection  120 mg Intravenous Q0600   mouth rinse  15 mL Mouth Rinse Q2H   pantoprazole sodium  40 mg Per Tube Daily   revefenacin  175 mcg Nebulization Daily   Continuous Infusions:  amiodarone 60 mg/hr (07/12/22 1300)   amiodarone     doxycycline (VIBRAMYCIN) IV     feeding supplement (VITAL 1.5 CAL) 30 mL/hr at 07/12/22 1300   fentaNYL infusion INTRAVENOUS 75 mcg/hr (07/12/22 1300)   heparin 1,150 Units/hr (07/12/22 1300)   propofol (DIPRIVAN) infusion 5 mcg/kg/min (07/12/22 1300)   PRN Meds: acetaminophen, albuterol, docusate, fentaNYL, hydrALAZINE, ondansetron (ZOFRAN) IV, mouth rinse, polyethylene glycol  Allergies:    Allergies  Allergen Reactions   Ipratropium Other (See Comments)    Metoprolol     Was bringing blood pressure too low and he would pass out.   Morphine And Related Nausea Only   Vicodin [Hydrocodone-Acetaminophen] Other (See Comments)    Hallucinations    Penicillins Rash    Did it involve swelling of the face/tongue/throat, SOB, or low BP? No Did it involve sudden or severe rash/hives, skin peeling, or any reaction on the inside of your mouth or nose? Yes Did you need to seek medical attention at  a hospital or doctor's office? No When did it last happen? Over 7 Years Ago      If all above answers are "NO", may proceed with cephalosporin use.      Social History:   Social History   Socioeconomic History   Marital status: Married    Spouse name: Jashaun Penrose   Number of children: 2   Years of education: Not on file   Highest education level: 11th grade  Occupational History   Occupation: Retired    Comment: Financial planner  Tobacco Use   Smoking status: Former    Types: Cigarettes    Quit date: 11/30/2012    Years since quitting: 9.6   Smokeless tobacco: Never  Vaping Use   Vaping Use: Never used  Substance and Sexual Activity   Alcohol use: No    Comment: last drink 10 + years   Drug use: No   Sexual activity: Not on file  Other Topics Concern   Not on file  Social History Narrative   Not on file   Social Determinants of Health   Financial Resource Strain: Low Risk  (03/10/2022)   Overall Financial Resource Strain (CARDIA)    Difficulty of Paying Living Expenses: Not hard at all  Food Insecurity: No Food Insecurity (03/10/2022)   Hunger Vital Sign    Worried About Running Out of Food in the Last Year: Never true    Evan in the Last Year: Never true  Transportation Needs: No Transportation Needs (03/10/2022)   PRAPARE - Hydrologist (Medical): No    Lack of Transportation (Non-Medical): No  Physical Activity: Not on file  Stress: Not on file  Social Connections: Not on file   Intimate Partner Violence: Not on file    Family History:    Family History  Problem Relation Age of Onset   Hypertension Mother    Heart disease Mother    Hypertension Sister    Heart disease Sister    Thyroid disease Sister    Thyroid disease Brother        hyperthyroid     ROS:  Obtained as above from wife, otherwise unable to obtain as patient is intubated  Physical Exam/Data:   Vitals:   07/12/22 1215 07/12/22 1230 07/12/22 1245 07/12/22 1300  BP: 127/79 131/82 134/73 132/69  Pulse: (!) 117 (!) 118 (!) 117 (!) 116  Resp: (!) 27 (!) 22 18 (!) 24  Temp:      TempSrc:      SpO2: 95% 97% 97% 97%  Weight:      Height:        Intake/Output Summary (Last 24 hours) at 07/12/2022 1344 Last data filed at 07/12/2022 1300 Gross per 24 hour  Intake 1013.03 ml  Output 1725 ml  Net -711.97 ml      07/12/2022    5:00 AM 07/11/2022    5:30 PM 07/04/2022    1:20 PM  Last 3 Weights  Weight (lbs) 180 lb 12.4 oz 180 lb 5.4 oz 186 lb 3.2 oz  Weight (kg) 82 kg 81.8 kg 84.46 kg     Body mass index is 26.7 kg/m.  General: Frail appearing elderly WM in no acute distress on vent. Head: Normocephalic, atraumatic, sclera non-icteric, no xanthomas, nares are without discharge. Dried blood from L side of mouth by ETT Neck: Negative for carotid bruits. JVP not elevated. Lungs: Faint crackles bilaterally without wheezing or rhonchi. Breathing is unlabored  on vent. Heart: Regular, tachycardic S1 S2 without murmurs, rubs, or gallops.  Abdomen: Soft, non-tender, non-distended with normoactive bowel sounds. No rebound/guarding. Extremities: No clubbing or cyanosis. No edema. Distal pedal pulses are 2+ and equal bilaterally. Neuro: Alert, awake on vent. Follows commands. Winces to nurse inserting IV with [place]. Psych: Calm affect   EKG:  The EKG was personally reviewed and demonstrates:  NSR 92bpm, LBBBB, nonspecific STTW changes  Telemetry:  Telemetry was personally reviewed and  demonstrates:  narrow complex tachycardia in the 120s, with brief NSR as well  Relevant CV Studies:   Cath 2019 Mid LM lesion is 75% stenosed. Ost LAD to Prox LAD lesion is 100% stenosed. Ost 1st Mrg lesion is 100% stenosed. Prox Cx to Mid Cx lesion is 100% stenosed. Prox RCA to Mid RCA lesion is 100% stenosed. LIMA graft was visualized by angiography and is normal in caliber. The graft exhibits no disease. Seq SVG- OM 1 and PDA graft was visualized by angiography and is normal in caliber. SVG graft was visualized by angiography and is normal in caliber. Origin lesion before 1st Mrg is 50% stenosed. LV end diastolic pressure is moderately elevated.   1. Severe 3 vessel occlusive CAD 2. Patent LIMA to the LAD 3. Patent sequential SVG to the first OM and PDA. Patent Y graft SVG to the diagonal 4. Moderately elevated LVEDP of 30 mm Hg.   Plan: medical management. Diuresis per primary team.   Laboratory Data:  High Sensitivity Troponin:   Recent Labs  Lab 07/11/22 1352 07/11/22 2259 07/12/22 0121 07/12/22 0618  TROPONINIHS 21* 833* 990* 1,166*     Chemistry Recent Labs  Lab 07/11/22 1352 07/11/22 1435 07/12/22 0121 07/12/22 1033  NA 137 138 139 139  K 4.3 4.0 3.5 3.9  CL 101  --  104  --   CO2 22  --  21*  --   GLUCOSE 146*  --  155*  --   BUN 28*  --  29*  --   CREATININE 3.00*  --  3.02*  --   CALCIUM 9.7  --  9.5  --   MG 2.3  --  2.3  --   GFRNONAA 21*  --  21*  --   ANIONGAP 14  --  14  --     Recent Labs  Lab 07/11/22 1352  PROT 7.3  ALBUMIN 4.0  AST 27  ALT 20  ALKPHOS 150*  BILITOT 0.8   Lipids  Recent Labs  Lab 07/12/22 0121  TRIG 191*    Hematology Recent Labs  Lab 07/11/22 1352 07/11/22 1435 07/12/22 0121 07/12/22 1033  WBC 13.4*  --  7.6  --   RBC 3.43*  --  3.20*  --   HGB 10.8* 10.5* 9.8* 10.5*  HCT 35.0* 31.0* 30.3* 31.0*  MCV 102.0*  --  94.7  --   MCH 31.5  --  30.6  --   MCHC 30.9  --  32.3  --   RDW 16.9*  --  16.5*   --   PLT 352  --  222  --    Thyroid No results for input(s): "TSH", "FREET4" in the last 168 hours.  BNP Recent Labs  Lab 07/11/22 1352  BNP 777.0*    DDimer No results for input(s): "DDIMER" in the last 168 hours.   Radiology/Studies:  DG Abd Portable 1V  Result Date: 07/11/2022 CLINICAL DATA:  Orogastric tube placement EXAM: PORTABLE ABDOMEN - 1 VIEW COMPARISON:  12/26/2017 FINDINGS: Orogastric tube enters the stomach with its tip in the mid body. Gas pattern unremarkable. IMPRESSION: Tube tip in the mid body of the stomach. Electronically Signed   By: Nelson Chimes M.D.   On: 07/11/2022 18:05   DG Chest Portable 1 View  Result Date: 07/11/2022 CLINICAL DATA:  A 76 year old male presents post intubation for assessment. EXAM: PORTABLE CHEST 1 VIEW COMPARISON:  April 15, 2022 FINDINGS: Endotracheal tube in place 7.2 cm from the carina, tip between clavicular heads. Gastric tube courses through in off the field the radiograph into the upper abdomen. Multi lead pacer device in place, power pack over the LEFT chest. EKG leads over the chest. Post median sternotomy. Stable cardiomediastinal contours. Diffuse interstitial and alveolar opacities throughout both LEFT and RIGHT chest slightly worse in the RIGHT lower chest. No lobar consolidation. Blunting of LEFT costodiaphragmatic sulcus no pneumothorax. On limited assessment there is no acute skeletal process. IMPRESSION: 1. Endotracheal tube 7.2 cm from the carina. 2. Diffuse interstitial and alveolar opacities throughout both lungs, slightly worse in the RIGHT lower chest. Pattern of disease could reflect asymmetric pulmonary edema or atypical/multifocal infection 3. Question small left effusion. 4. Gastric tube courses through off the field the radiograph into the upper abdomen. Electronically Signed   By: Zetta Bills M.D.   On: 07/11/2022 14:12     Assessment and Plan:   1. Acute on chronic respiratory failure, with suspected AECOPD and  acute on chronic HFrEF - unclear precipitant, has baseline severe comorbidities including HFrEF, CKD, and COPD - continue IV Lasix at present dosing -we will to see which direction his renal function goes as well as how he does with ongoing diuresis in order to determine which direction to go going forward. - Covid swab pending, pro cal fairly low - pulm mgmt per critical care - repeat echo pending - will review with MD Bedside review of the echocardiogram that was ongoing at my evaluation suggested his EF is not significantly down.  I think this is somewhat concerning from a standpoint that he has had several recent exacerbations and is now here again.  We will await the full echocardiogram result.  We will also need to closely follow his renal function and response to initial diuresis.  2. Paroxysmal atrial fib/flutter/SVT, currently with narrow complex tachycardia - will obtain f/u EKG to discern rhythm (was NSR on arrival, presently in a narrow complex tach - query atrial tach/SVT vs atrial flutter) - on IV amiodarone currently @ 60/hr (no bolus) in lieu of carvedilol - likely need to continue some beta blocker to avoid precipitating withdrawal or hypertensive crisis - on heparin in lieu of Eliquis - will discuss strategy with MD once EKG available On my review appears to be having episodic bursts of what is probably atrial tachycardia.  Does not look like flutter or atrial fib.  The abrupt onset and cessation on telemetry would suggest SVT/PAT (PAT could be very likely in setting of ongoing pulmonary illness) Gave an additional 150 mg bolus of amiodarone and will continue the amiodarone infusion, In order to potentially suppress these tachycardic spells. 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 We have consulted electrophysiology who are familiar with him in order to help out with potentially  adjusting doses.  I do not know that high-dose carvedilol is the right option. We will reduce the carvedilol dose to 12.5 mg twice  daily for now.  3. AKI on CKD stage IV - baseline CR around 2.4-2.8 recently, here 3.0 - not a candidate for ACEI, ARB, ARNI, spirnolactone due to this - follows with nephrology as OP Unfortunately, renal function may be the rate limiting step as far as diuresis goes.  4. CAD s/p remote CABG 2014, NSTEMI 02/2022 treated medically, HLD, here with recurrent NSTEMI - unknown if this is a primary ACS with de novo CAD versus demand ischemia from multiple physiologic stressors - given continued renal insufficiency and poor baseline functional status recently, anticipate medical therapy - on heparin, ASA, beta blocker, statin Difficult scenario and that it is very hard to know his volume status and also if there is progression of his CAD.  With concerning progression of renal insufficiency, I am reluctant to recommend cardiac catheterization, however ultimately right left heart cath would potentially be a definitive option--if not complicated by his renal insufficiency. Question is of these recent exacerbations a result of ischemic etiology or potentially arrhythmia genic with bursts of tachycardia leading to potential tachycardia induced cardiomyopathy.  5. Essential HTN - managed in context above  6. Carotid artery disease, potential L subclavian stenosis - occluded RICA, 24-23% LICA with prior stent placement, stenotic L subclavian by duplex 10/2021 - consider obtaining BP on opposite side if acceptable from line standpoint  7. ICD in place - will need to continue to follow GOC   Risk Assessment/Risk Scores:     TIMI Risk Score for Unstable Angina or Non-ST Elevation MI:   The patient's TIMI risk score is 5, which indicates a 26% risk of all cause mortality, new or recurrent myocardial infarction or need for urgent revascularization in the next 14 days.  [place]  [place] Heart Association (NYHA) Functional Class NYHA Class IV on arrival  CHA2DS2-VASc Score = 7   This indicates a 11.2% annual risk of stroke. The patient's score is based upon: CHF History: 1 HTN History: 1 Diabetes History: 0 Stroke History: 2 Vascular Disease History: 1 Age Score: 2 Gender Score: 0         For questions or updates, please contact [facility] Please consult [email or web address] for contact info under    Signed, Charlie Pitter, PA-C  07/12/2022 1:44 PM    ATTENDING ATTESTATION  I have seen, examined and evaluated the patient this afternoon on consultation along with Melina Copa, PA.  After reviewing all the available data and chart, we discussed the patients laboratory, study & physical findings as well as symptoms in detail. I agree with her findings, examination as well as impression recommendations as per our discussion.    Attending adjustments noted in italics.  Relatively early on in the stage of his exacerbation.  Echocardiogram results still pending and we will we had to see how he will respond to diuresis and adjustment of his medications.  We will continue to follow along with the recommendations noted above. Ultimately it is somewhat concerning this gentleman's had multiple hospitalizations recently with cardiac issues. My bedside review of his echo however did not suggest that his EF is dramatically down, but will await formal review.    Glenetta Hew, M.D., M.S. Interventional Cardiologist   Pager # [phone] Phone # [phone] [address] [place], [place] [address]

## 2022-07-12 NOTE — Progress Notes (Signed)
[facility] Progress Note [name] DOB: [birth year] MRN: [id number]   Date of Service  07/12/2022  HPI/Events of Note  K+ 3.5, GFR 21.  eICU Interventions  KCL 40 meq via feeding tube x 1.        Joe Wiggins [name] 07/12/2022, 6:21 AM

## 2022-07-12 NOTE — Progress Notes (Addendum)
ANTICOAGULATION CONSULT NOTE - Initial Consult  Pharmacy Consult for apixaban>> heparin Indication: atrial fibrillation  Allergies  Allergen Reactions   Ipratropium Other (See Comments)   Metoprolol     Was bringing blood pressure too low and he would pass out.   Morphine And Related Nausea Only   Vicodin [Hydrocodone-Acetaminophen] Other (See Comments)    Hallucinations    Penicillins Rash    Did it involve swelling of the face/tongue/throat, SOB, or low BP? No Did it involve sudden or severe rash/hives, skin peeling, or any reaction on the inside of your mouth or nose? Yes Did you need to seek medical attention at a hospital or doctor's office? No When did it last happen? Over 7 Years Ago      If all above answers are "NO", may proceed with cephalosporin use.      Patient Measurements: Height: 5\' 9"  (175.3 cm) Weight: 82 kg (180 lb 12.4 oz) IBW/kg (Calculated) : 70.7 Heparin Dosing Weight: 82 kg   Vital Signs: Temp: 97.3 F (36.3 C) (07/18 0744) Temp Source: Axillary (07/18 0744) BP: 116/91 (07/18 0900) Pulse Rate: 120 (07/18 0900)  Labs: Recent Labs    07/11/22 1352 07/11/22 1435 07/11/22 2259 07/12/22 0121 07/12/22 0618  HGB 10.8* 10.5*  --  9.8*  --   HCT 35.0* 31.0*  --  30.3*  --   PLT 352  --   --  222  --   LABPROT 20.2*  --   --   --   --   INR 1.7*  --   --   --   --   CREATININE 3.00*  --   --  3.02*  --   TROPONINIHS 21*  --  833* 990* 1,166*    Estimated Creatinine Clearance: 20.5 mL/min (A) (by C-G formula based on SCr of 3.02 mg/dL (H)).   Medical History: Past Medical History:  Diagnosis Date   Abrasion of forearm, right 07/27/2019   Acute respiratory failure (HCC)    AF (paroxysmal atrial fibrillation) (HCC)    AICD (automatic cardioverter/defibrillator) present    CAD (coronary artery disease)    Cardiomyopathy, ischemic    Carotid artery occlusion    Chronic kidney disease    COPD (chronic obstructive pulmonary disease) (HCC)     Cough 11/13/2019   Dementia without behavioral disturbance (HCC)    Dizziness 01/17/2016   Facial rash 08/15/2018   GERD (gastroesophageal reflux disease)    Hyperlipidemia    Hypertension    Pre-syncope 01/16/2018   Respiratory distress 12/27/2020   Sleep apnea    Stroke ([place])    TIA (transient ischemic attack)     Medications:  Scheduled:   allopurinol  100 mg Per Tube Daily   arformoterol  15 mcg Nebulization BID   aspirin  81 mg Per Tube Daily   atorvastatin  40 mg Per Tube Daily   budesonide (PULMICORT) nebulizer solution  0.5 mg Nebulization BID   Chlorhexidine Gluconate Cloth  6 each Topical Daily   doxazosin  1 mg Per Tube Daily   furosemide  80 mg Intravenous Q8H   insulin aspart  0-9 Units Subcutaneous Q4H   ipratropium-albuterol  3 mL Nebulization Q6H   loratadine  10 mg Per Tube Daily   methylPREDNISolone (SOLU-MEDROL) injection  120 mg Intravenous Q0600   mouth rinse  15 mL Mouth Rinse Q2H   pantoprazole sodium  40 mg Per Tube Daily   revefenacin  175 mcg Nebulization Daily    Assessment:  5 yom presenting with SOB - on apixaban PTA for hx Afib. Last dose on 7/17 at 2137.   Hgb 9.8, plt 222. Trop 16>1096. Will change to heparin while Scr up and in case of need for procedure. Of note, has bleeding from mouth.  Given recent DOAC, will monitor both aPTT and heparin level until correlate.   Goal of Therapy:  Heparin level 0.3-0.7 units/ml aPTT 66-102 seconds Monitor platelets by anticoagulation protocol: Yes   Plan:  Start heparin infusion at 1150 units/hr  Order heparin level/aPTT in 8 hours Monitor daily HL/aPTT until correlate, CBC, and for s/sx of bleeding  Antonietta Jewel, PharmD, [place] Pharmacist  Phone: [phone] 07/12/2022 10:21 AM  Please check AMION for all [place] phone numbers After 10:00 PM, call [place] [phone]

## 2022-07-12 NOTE — Progress Notes (Signed)
eLink Physician-Brief Progress Note [name] DOB: [birth year] MRN: [id number]   Date of Service  07/12/2022  HPI/Events of Note  Patient with increase  in Troponin from 21 to 833 in the context of know extensive and advanced coronary artery disease, EKG is without acute changes of STEMI, suggesting NSTEMI likely secondary to demand.  eICU Interventions  Continue Eliquis and Aspirin 81 mg daily, Echocardiogram in AM to assess RWMA in relation to baseline echocardiogram,  Statins, Carvedilol,  Cardiology consultation.        Kerry Kass Annalisa Colonna 07/12/2022, 12:46 AM

## 2022-07-12 NOTE — Progress Notes (Signed)
PHARMACY - PHYSICIAN COMMUNICATION CRITICAL VALUE ALERT - BLOOD CULTURE IDENTIFICATION (BCID)  Joe Wiggins is an 76 y.o. male who presented to [facility] on 07/11/2022 with a chief complaint of shortness of breath  7/17 Blood>>Gram + rods   Name of physician (or Provider) Contacted: Dr. Lucile Shutters  Current antibiotics: Ceftriaxone/Azithromycin   Changes to prescribed antibiotics recommended:  Likely contaminant No changes   No results found for this or any previous visit.  Narda Bonds 07/12/2022  4:54 AM

## 2022-07-13 ENCOUNTER — Inpatient Hospital Stay (HOSPITAL_COMMUNITY): Payer: Medicare HMO

## 2022-07-13 DIAGNOSIS — R000 Tachycardia, unspecified: Secondary | ICD-10-CM | POA: Diagnosis not present

## 2022-07-13 DIAGNOSIS — I5021 Acute systolic (congestive) heart failure: Secondary | ICD-10-CM

## 2022-07-13 DIAGNOSIS — Z9911 Dependence on respirator [ventilator] status: Secondary | ICD-10-CM | POA: Diagnosis not present

## 2022-07-13 DIAGNOSIS — J441 Chronic obstructive pulmonary disease with (acute) exacerbation: Secondary | ICD-10-CM

## 2022-07-13 DIAGNOSIS — I5043 Acute on chronic combined systolic (congestive) and diastolic (congestive) heart failure: Secondary | ICD-10-CM | POA: Diagnosis not present

## 2022-07-13 DIAGNOSIS — J9601 Acute respiratory failure with hypoxia: Secondary | ICD-10-CM | POA: Diagnosis not present

## 2022-07-13 LAB — COMPREHENSIVE METABOLIC PANEL
ALT: 16 U/L (ref 0–44)
AST: 36 U/L (ref 15–41)
Albumin: 3.4 g/dL — ABNORMAL LOW (ref 3.5–5.0)
Alkaline Phosphatase: 104 U/L (ref 38–126)
Anion gap: 12 (ref 5–15)
BUN: 47 mg/dL — ABNORMAL HIGH (ref 8–23)
CO2: 22 mmol/L (ref 22–32)
Calcium: 9.4 mg/dL (ref 8.9–10.3)
Chloride: 106 mmol/L (ref 98–111)
Creatinine, Ser: 3.54 mg/dL — ABNORMAL HIGH (ref 0.61–1.24)
GFR, Estimated: 17 mL/min — ABNORMAL LOW (ref >60)
Glucose, Bld: 152 mg/dL — ABNORMAL HIGH (ref 70–99)
Potassium: 4 mmol/L (ref 3.5–5.1)
Sodium: 140 mmol/L (ref 135–145)
Total Bilirubin: 0.6 mg/dL (ref 0.3–1.2)
Total Protein: 6.3 g/dL — ABNORMAL LOW (ref 6.5–8.1)

## 2022-07-13 LAB — GLUCOSE, CAPILLARY
Glucose-Capillary: 158 mg/dL — ABNORMAL HIGH (ref 70–99)
Glucose-Capillary: 119 mg/dL — ABNORMAL HIGH (ref 70–99)
Glucose-Capillary: 132 mg/dL — ABNORMAL HIGH (ref 70–99)
Glucose-Capillary: 130 mg/dL — ABNORMAL HIGH (ref 70–99)
Glucose-Capillary: 133 mg/dL — ABNORMAL HIGH (ref 70–99)
Glucose-Capillary: 151 mg/dL — ABNORMAL HIGH (ref 70–99)

## 2022-07-13 LAB — URINALYSIS, ROUTINE W REFLEX MICROSCOPIC
Bilirubin Urine: NEGATIVE
Glucose, UA: 150 mg/dL — AB
Ketones, ur: NEGATIVE mg/dL
Leukocytes,Ua: NEGATIVE
Nitrite: NEGATIVE
Protein, ur: 100 mg/dL — AB
RBC / HPF: >50 RBC/hpf — ABNORMAL HIGH (ref 0–5)
Specific Gravity, Urine: 1.017 (ref 1.005–1.030)
WBC, UA: >50 WBC/hpf — ABNORMAL HIGH (ref 0–5)
pH: 5 (ref 5.0–8.0)

## 2022-07-13 LAB — CBC
HCT: 29.3 % — ABNORMAL LOW (ref 39.0–52.0)
Hemoglobin: 9.7 g/dL — ABNORMAL LOW (ref 13.0–17.0)
MCH: 31 pg (ref 26.0–34.0)
MCHC: 33.1 g/dL (ref 30.0–36.0)
MCV: 93.6 fL (ref 80.0–100.0)
Platelets: 247 10*3/uL (ref 150–400)
RBC: 3.13 MIL/uL — ABNORMAL LOW (ref 4.22–5.81)
RDW: 17.2 % — ABNORMAL HIGH (ref 11.5–15.5)
WBC: 10.7 10*3/uL — ABNORMAL HIGH (ref 4.0–10.5)
nRBC: 0 % (ref 0.0–0.2)

## 2022-07-13 LAB — APTT: aPTT: 101 seconds — ABNORMAL HIGH (ref 24–36)

## 2022-07-13 LAB — CULTURE, BLOOD (ROUTINE X 2): Special Requests: ADEQUATE

## 2022-07-13 LAB — HEPARIN LEVEL (UNFRACTIONATED): Heparin Unfractionated: >1.1 IU/mL — ABNORMAL HIGH (ref 0.30–0.70)

## 2022-07-13 LAB — PHOSPHORUS: Phosphorus: 3.7 mg/dL (ref 2.5–4.6)

## 2022-07-13 LAB — BRAIN NATRIURETIC PEPTIDE: B Natriuretic Peptide: 315.4 pg/mL — ABNORMAL HIGH (ref 0.0–100.0)

## 2022-07-13 LAB — MAGNESIUM: Magnesium: 2.4 mg/dL (ref 1.7–2.4)

## 2022-07-13 LAB — PROCALCITONIN: Procalcitonin: 0.43 ng/mL

## 2022-07-13 MED ORDER — ALLOPURINOL 100 MG PO TABS
100.0000 mg | ORAL_TABLET | Freq: Every day | ORAL | Status: DC
Start: 2022-07-14 — End: 2022-07-20
  Administered 2022-07-14 – 2022-07-20 (×7): 100 mg via ORAL
  Filled 2022-07-13 (×7): qty 1

## 2022-07-13 MED ORDER — DOXYCYCLINE HYCLATE 100 MG PO TABS
100.0000 mg | ORAL_TABLET | Freq: Two times a day (BID) | ORAL | Status: DC
Start: 2022-07-13 — End: 2022-07-13

## 2022-07-13 MED ORDER — LORATADINE 10 MG PO TABS
10.0000 mg | ORAL_TABLET | Freq: Every day | ORAL | Status: DC
  Administered 2022-07-14 – 2022-07-20 (×7): 10 mg via ORAL
  Filled 2022-07-13 (×7): qty 1

## 2022-07-13 MED ORDER — CARVEDILOL 25 MG PO TABS
25.0000 mg | ORAL_TABLET | Freq: Two times a day (BID) | ORAL | Status: DC
  Administered 2022-07-13 – 2022-07-20 (×14): 25 mg via ORAL
  Filled 2022-07-13 (×14): qty 1

## 2022-07-13 MED ORDER — ATORVASTATIN CALCIUM 40 MG PO TABS
40.0000 mg | ORAL_TABLET | Freq: Every day | ORAL | Status: DC
  Administered 2022-07-13 – 2022-07-19 (×7): 40 mg via ORAL
  Filled 2022-07-13 (×8): qty 1

## 2022-07-13 MED ORDER — ACETAMINOPHEN 325 MG PO TABS: 650.0000 mg | ORAL_TABLET | ORAL | Status: DC | PRN

## 2022-07-13 MED ORDER — ORAL CARE MOUTH RINSE: 15.0000 mL | OROMUCOSAL | Status: DC | PRN

## 2022-07-13 MED ORDER — ASPIRIN 81 MG PO CHEW
81.0000 mg | CHEWABLE_TABLET | Freq: Every day | ORAL | Status: DC
  Administered 2022-07-14 – 2022-07-16 (×3): 81 mg via ORAL
  Filled 2022-07-13 (×3): qty 1

## 2022-07-13 MED ORDER — POLYETHYLENE GLYCOL 3350 17 G PO PACK: 17.0000 g | PACK | Freq: Every day | ORAL | Status: DC | PRN

## 2022-07-13 MED ORDER — DOCUSATE SODIUM 100 MG PO CAPS
100.0000 mg | ORAL_CAPSULE | Freq: Two times a day (BID) | ORAL | Status: DC | PRN
  Administered 2022-07-16: 100 mg via ORAL
  Filled 2022-07-13: qty 1

## 2022-07-13 MED ORDER — DOXAZOSIN MESYLATE 1 MG PO TABS
1.0000 mg | ORAL_TABLET | Freq: Every day | ORAL | Status: DC
  Administered 2022-07-14 – 2022-07-17 (×4): 1 mg via ORAL
  Filled 2022-07-13 (×5): qty 1

## 2022-07-13 MED ORDER — DOXYCYCLINE HYCLATE 100 MG PO TABS
100.0000 mg | ORAL_TABLET | Freq: Two times a day (BID) | ORAL | Status: DC
  Administered 2022-07-13 – 2022-07-15 (×5): 100 mg via ORAL
  Filled 2022-07-13 (×6): qty 1

## 2022-07-13 MED ORDER — PROSOURCE PLUS PO LIQD
30.0000 mL | Freq: Three times a day (TID) | ORAL | Status: DC
  Administered 2022-07-13 – 2022-07-14 (×3): 30 mL via ORAL
  Filled 2022-07-13 (×3): qty 30

## 2022-07-13 MED ORDER — CLOPIDOGREL BISULFATE 75 MG PO TABS
75.0000 mg | ORAL_TABLET | Freq: Every day | ORAL | Status: DC
  Administered 2022-07-14 – 2022-07-20 (×7): 75 mg via ORAL
  Filled 2022-07-13 (×7): qty 1

## 2022-07-13 NOTE — Progress Notes (Signed)
Progress Note  [name] Date of Encounter: 07/13/2022  [facility] Cardiologist: Donato Heinz, MD   Subjective   Extubated to Bipap, interactive with conversation. Wife at bedside. No pain or current concerns.  Inpatient Medications    Scheduled Meds:  allopurinol  100 mg Per Tube Daily   arformoterol  15 mcg Nebulization BID   aspirin  81 mg Per Tube Daily   atorvastatin  40 mg Per Tube Daily   budesonide (PULMICORT) nebulizer solution  0.5 mg Nebulization BID   carvedilol  25 mg Per Tube BID   Chlorhexidine Gluconate Cloth  6 each Topical Daily   doxazosin  1 mg Per Tube Daily   doxycycline  100 mg Per Tube Q12H   feeding supplement (PROSource TF)  45 mL Per Tube TID   insulin aspart  0-9 Units Subcutaneous Q4H   ipratropium-albuterol  3 mL Nebulization Q6H   loratadine  10 mg Per Tube Daily   mouth rinse  15 mL Mouth Rinse Q2H   pantoprazole sodium  40 mg Per Tube Daily   revefenacin  175 mcg Nebulization Daily   Continuous Infusions:  amiodarone 30 mg/hr (07/13/22 0700)   feeding supplement (VITAL 1.5 CAL) 50 mL/hr at 07/13/22 0700   fentaNYL infusion INTRAVENOUS 50 mcg/hr (07/13/22 0700)   heparin 1,150 Units/hr (07/13/22 0700)   propofol (DIPRIVAN) infusion Stopped (07/13/22 0545)   PRN Meds: acetaminophen, albuterol, docusate, fentaNYL, hydrALAZINE, ondansetron (ZOFRAN) IV, mouth rinse, polyethylene glycol   Vital Signs    Vitals:   07/13/22 1100 07/13/22 1112 07/13/22 1128 07/13/22 1200  BP: 139/76 101/67 (!) 151/76   Pulse: 82 86 81   Resp: 10 (!) 21 18   Temp:    98.2 F (36.8 C)  TempSrc:    Oral  SpO2: 98% 100% 99%   Weight:      Height:        Intake/Output Summary (Last 24 hours) at 07/13/2022 1209 Last data filed at 07/13/2022 0700 Gross per 24 hour  Intake 2244.79 ml  Output 900 ml  Net 1344.79 ml      07/13/2022    5:00 AM 07/12/2022    5:00 AM 07/11/2022    5:30 PM  Last 3 Weights  Weight (lbs) 182 lb  15.7 oz 180 lb 12.4 oz 180 lb 5.4 oz  Weight (kg) 83 kg 82 kg 81.8 kg      Telemetry    Atrial tachycardia episodes - Personally Reviewed  ECG    No new - Personally Reviewed  Physical Exam   GEN: No acute distress.   Neck: No JVD Cardiac: RRR, no murmurs, rubs, or gallops.  Respiratory: bipap in place, diminished laterally GI: Soft, nontender, non-distended  MS: trace edema; No deformity. Heels dressed. Warm Neuro:  Nonfocal  Psych: Normal affect   Labs    High Sensitivity Troponin:   Recent Labs  Lab 07/11/22 1352 07/11/22 2259 07/12/22 0121 07/12/22 0618  TROPONINIHS 21* 833* 990* 1,166*     Chemistry Recent Labs  Lab 07/11/22 1352 07/11/22 1435 07/12/22 1326 07/12/22 1621 07/12/22 2102 07/13/22 0613  NA 137   < >  --  141 140 140  K 4.3   < >  --  3.8 3.9 4.0  CL 101   < >  --  106 104 106  CO2 22   < >  --  21* 20* 22  GLUCOSE 146*   < >  --  154* 160*  152*  BUN 28*   < >  --  38* 38* 47*  CREATININE 3.00*   < >  --  3.31* 3.23* 3.54*  CALCIUM 9.7   < >  --  9.7 9.9 9.4  MG 2.3   < > 2.5* 2.4  --  2.4  PROT 7.3  --   --   --   --  6.3*  ALBUMIN 4.0  --   --   --   --  3.4*  AST 27  --   --   --   --  36  ALT 20  --   --   --   --  16  ALKPHOS 150*  --   --   --   --  104  BILITOT 0.8  --   --   --   --  0.6  GFRNONAA 21*   < >  --  18* 19* 17*  ANIONGAP 14   < >  --  14 16* 12   < > = values in this interval not displayed.    Lipids  Recent Labs  Lab 07/12/22 0121  TRIG 191*    Hematology Recent Labs  Lab 07/11/22 1352 07/11/22 1435 07/12/22 0121 07/12/22 1033 07/13/22 0613  WBC 13.4*  --  7.6  --  10.7*  RBC 3.43*  --  3.20*  --  3.13*  HGB 10.8*   < > 9.8* 10.5* 9.7*  HCT 35.0*   < > 30.3* 31.0* 29.3*  MCV 102.0*  --  94.7  --  93.6  MCH 31.5  --  30.6  --  31.0  MCHC 30.9  --  32.3  --  33.1  RDW 16.9*  --  16.5*  --  17.2*  PLT 352  --  222  --  247   < > = values in this interval not displayed.   Thyroid  Recent Labs   Lab 07/12/22 1621  TSH 1.029    BNP Recent Labs  Lab 07/11/22 1352 07/13/22 0613  BNP 777.0* 315.4*    DDimer No results for input(s): "DDIMER" in the last 168 hours.   Radiology    DG Chest Port 1 View  Result Date: 07/13/2022 CLINICAL DATA:  Respiratory failure. EXAM: PORTABLE CHEST 1 VIEW COMPARISON:  July 11, 2022. FINDINGS: Stable cardiomediastinal silhouette. Sternotomy wires are noted. Endotracheal and nasogastric tubes are unchanged in position. Left-sided pacemaker is unchanged. Slightly improved bilateral lung opacities are noted suggesting improving pulmonary edema or multifocal pneumonia. Bony thorax is unremarkable. IMPRESSION: Stable support apparatus. Improved bilateral lung opacities as described above. Electronically Signed   By: Marijo Conception M.D.   On: 07/13/2022 08:44   ECHOCARDIOGRAM COMPLETE  Result Date: 07/12/2022    ECHOCARDIOGRAM REPORT   Patient Name:   DIOGO ANNE Date of Exam: 07/12/2022 Medical Rec #:  [id number]  Height:       69.0 in Accession #:    [id number] Weight:       180.8 lb Date of Birth:  08-16-1946  BSA:          1.979 m Patient Age:    17 years   BP:           132/75 mmHg Patient Gender: M          HR:           111 bpm. Exam Location:  Inpatient Procedure: 2D Echo, Cardiac Doppler and Color Doppler Indications:  Ischemic cardiomyopathy  History:         Patient has prior history of Echocardiogram examinations, most                  recent 03/08/2022. CHF, CAD, COPD, Arrythmias:Atrial                  Fibrillation; Risk Factors:Hypertension.  Sonographer:     Jefferey Pica Referring Phys:  [id number] Frederik Pear Diagnosing Phys: Eleonore Chiquito MD IMPRESSIONS  1. Left ventricular ejection fraction, by estimation, is 40 to 45%. The left ventricle has mildly decreased function. The left ventricle demonstrates regional wall motion abnormalities (see scoring diagram/findings for description). Left ventricular diastolic function could not be  evaluated.  2. Right ventricular systolic function is normal. The right ventricular size is normal. There is mildly elevated pulmonary artery systolic pressure. The estimated right ventricular systolic pressure is 18.8 mmHg.  3. The mitral valve is grossly normal. Trivial mitral valve regurgitation. No evidence of mitral stenosis.  4. The aortic valve is tricuspid. Aortic valve regurgitation is not visualized. No aortic stenosis is present.  5. The inferior vena cava is normal in size with <50% respiratory variability, suggesting right atrial pressure of 8 mmHg. Comparison(s): No significant change from prior study. FINDINGS  Left Ventricle: Left ventricular ejection fraction, by estimation, is 40 to 45%. The left ventricle has mildly decreased function. The left ventricle demonstrates regional wall motion abnormalities. The left ventricular internal cavity size was normal in size. There is no left ventricular hypertrophy. Left ventricular diastolic function could not be evaluated due to atrial fibrillation. Left ventricular diastolic function could not be evaluated.  LV Wall Scoring: The antero-lateral wall and posterior wall are hypokinetic. Right Ventricle: The right ventricular size is normal. No increase in right ventricular wall thickness. Right ventricular systolic function is normal. There is mildly elevated pulmonary artery systolic pressure. The tricuspid regurgitant velocity is 2.90  m/s, and with an assumed right atrial pressure of 8 mmHg, the estimated right ventricular systolic pressure is 41.6 mmHg. Left Atrium: Left atrial size was normal in size. Right Atrium: Right atrial size was normal in size. Pericardium: There is no evidence of pericardial effusion. Mitral Valve: The mitral valve is grossly normal. Trivial mitral valve regurgitation. No evidence of mitral valve stenosis. Tricuspid Valve: The tricuspid valve is grossly normal. Tricuspid valve regurgitation is mild . No evidence of tricuspid  stenosis. Aortic Valve: The aortic valve is tricuspid. Aortic valve regurgitation is not visualized. No aortic stenosis is present. Aortic valve peak gradient measures 3.2 mmHg. Pulmonic Valve: The pulmonic valve was grossly normal. Pulmonic valve regurgitation is not visualized. No evidence of pulmonic stenosis. Aorta: The aortic root is normal in size and structure. Venous: The inferior vena cava is normal in size with less than 50% respiratory variability, suggesting right atrial pressure of 8 mmHg. IAS/Shunts: The atrial septum is grossly normal. Additional Comments: A device lead is visualized in the right atrium and right ventricle.  LEFT VENTRICLE PLAX 2D LVIDd:         5.30 cm LVIDs:         4.60 cm LV PW:         1.20 cm LV IVS:        1.10 cm LVOT diam:     2.00 cm LV SV:         54 LV SV Index:   27 LVOT Area:     3.14 cm  RIGHT VENTRICLE  IVC RV Basal diam:  3.50 cm     IVC diam: 1.90 cm RV Mid diam:    2.50 cm RV S prime:     11.50 cm/s TAPSE (M-mode): 1.6 cm LEFT ATRIUM             Index        RIGHT ATRIUM           Index LA diam:        3.70 cm 1.87 cm/m   RA Area:     20.60 cm LA Vol (A2C):   51.8 ml 26.17 ml/m  RA Volume:   61.80 ml  31.23 ml/m LA Vol (A4C):   56.6 ml 28.60 ml/m LA Biplane Vol: 53.9 ml 27.23 ml/m  AORTIC VALVE                 PULMONIC VALVE AV Area (Vmax): 2.91 cm     PV Vmax:       0.75 m/s AV Vmax:        89.95 cm/s   PV Peak grad:  2.3 mmHg AV Peak Grad:   3.2 mmHg LVOT Vmax:      83.20 cm/s LVOT Vmean:     52.100 cm/s LVOT VTI:       0.172 m  AORTA Ao Root diam: 3.70 cm TRICUSPID VALVE TR Peak grad:   33.6 mmHg TR Vmax:        290.00 cm/s  SHUNTS Systemic VTI:  0.17 m Systemic Diam: 2.00 cm Eleonore Chiquito MD Electronically signed by Eleonore Chiquito MD Signature Date/Time: 07/12/2022/3:52:34 PM    Final (Updated)    DG Abd Portable 1V  Result Date: 07/11/2022 CLINICAL DATA:  Orogastric tube placement EXAM: PORTABLE ABDOMEN - 1 VIEW COMPARISON:  12/26/2017  FINDINGS: Orogastric tube enters the stomach with its tip in the mid body. Gas pattern unremarkable. IMPRESSION: Tube tip in the mid body of the stomach. Electronically Signed   By: Nelson Chimes M.D.   On: 07/11/2022 18:05   DG Chest Portable 1 View  Result Date: 07/11/2022 CLINICAL DATA:  A 76 year old male presents post intubation for assessment. EXAM: PORTABLE CHEST 1 VIEW COMPARISON:  April 15, 2022 FINDINGS: Endotracheal tube in place 7.2 cm from the carina, tip between clavicular heads. Gastric tube courses through in off the field the radiograph into the upper abdomen. Multi lead pacer device in place, power pack over the LEFT chest. EKG leads over the chest. Post median sternotomy. Stable cardiomediastinal contours. Diffuse interstitial and alveolar opacities throughout both LEFT and RIGHT chest slightly worse in the RIGHT lower chest. No lobar consolidation. Blunting of LEFT costodiaphragmatic sulcus no pneumothorax. On limited assessment there is no acute skeletal process. IMPRESSION: 1. Endotracheal tube 7.2 cm from the carina. 2. Diffuse interstitial and alveolar opacities throughout both lungs, slightly worse in the RIGHT lower chest. Pattern of disease could reflect asymmetric pulmonary edema or atypical/multifocal infection 3. Question small left effusion. 4. Gastric tube courses through off the field the radiograph into the upper abdomen. Electronically Signed   By: Zetta Bills M.D.   On: 07/11/2022 14:12    Cardiac Studies   Echo as above  Patient Profile     76 y.o. 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  Assessment & Plan     Principal Problem:   Acute exacerbation of CHF (congestive heart failure) (HCC) Active Problems:   Malnutrition of moderate degree  1. Acute on chronic respiratory failure, with suspected AECOPD and acute on chronic HFrEF - unclear precipitant, has baseline severe comorbidities including HFrEF, CKD, and COPD - lasix held yesterday evening, patient's Optivol reportedly near normal. Appears warm and dry on exam. - Echo unchanged from prior, EF mildly reduced. No new regionals compared to prior, in setting of NSTEMI.  - unclear if this is related to ischemia so will treat NSTEMI medically. Wife notes he does not feel well at home and is slowing down, however this is likely multifactorial. - hx of HTN, may need renal ultrasound to exclude RAS with possibility of acute pulmonary edema with episodes of HTN. Wife reports he has well controlled BP at home. - Diuresis has been adequate has he has clinically improved, with rise in cr. Agree with holding lasix.  - nephrology to get involved today.    2. Paroxysmal atrial fib/flutter/SVT, currently with narrow complex tachycardia - on IV amiodarone - on heparin in lieu of Eliquis  - carvedilol 25 mg BID, EP on board   3. AKI on CKD stage IV - baseline CR around 2.4-2.8 recently, here 3.5 today. Neph to get involved. - not a candidate for ACEI, ARB, ARNI, spirolactone due to this    4. CAD s/p remote CABG 2014, NSTEMI 02/2022 treated medically, HLD, here with recurrent NSTEMI - unknown if this is a primary ACS with de novo CAD versus demand ischemia from multiple physiologic stressors - given continued renal insufficiency and poor baseline functional status recently, anticipate medical therapy - on heparin, ASA, beta blocker, statin - would add plavix if no contraindication given elevated troponin to treat NSTEMI medically.   5. Essential HTN - managed in context above   6. Carotid artery disease, potential L subclavian stenosis - occluded  RICA, 40-97% LICA with prior stent placement, stenotic L subclavian by duplex 10/2021 - consider obtaining BP on opposite side if acceptable from line standpoint   7. ICD in place - will need to continue to follow [place]     For questions or updates, please contact [facility] Please consult [email or web address] for contact info under        Signed, Elouise Munroe, MD  07/13/2022, 12:09 PM

## 2022-07-13 NOTE — Progress Notes (Signed)
eLink Physician-Brief Progress Note [name] DOB: 1946-02-01 MRN: [id number]   Date of Service  07/13/2022  HPI/Events of Note  Pleasantly confused , follows commands, several issues Tachy on amio @ 30 COnfusion I/o 600 cc  eICU Interventions  Dose coreg early qtC 570 on monitor, try to avoid haldol/seroquel Try to avoid foley/ rpt I/o cath ok if needed     Intervention Category Major Interventions: Arrhythmia - evaluation and management;Other:  Leanna Sato Honest Vanleer 07/13/2022, 7:46 PM

## 2022-07-13 NOTE — Progress Notes (Addendum)
NAME:  Joe Wiggins, MRN:  [id number], DOB:  [birth year], LOS: 2 ADMISSION DATE:  07/11/2022, CONSULTATION DATE:  07/11/22 REFERRING MD:  EDP CHIEF COMPLAINT:  Acute Hypoxic Respiratory Failure   History of Present Illness:  Patient with PMHx noted below presenting to ED with worsening shortness of breath. His wife states this has been an issue for the patient for 3-4 weeks but it was exertional and this morning patient had worsening dyspnea that did not resolve. Wife states patient did not have any fevers, cough, n/v/d. He has been complaining of worsening dyspnea and orthopnea. She endorsed he is adherent to his medications.   His dyspnea worsened acutely and EMS was called.  They used nonrebreather and CPAP but the patient did not tolerate.  Patient was receiving bag-valve-mask on ED arrival and was intubated on arrival. Showed leukocytosis at 13.4k.  CMP showed creatinine at 3.0 from baseline of 2.4-2.5.  Troponins were mildly elevated.  BNP elevated at 777.  ABG obtained showed 7.3/63.4/227.  Blood cultures were obtained and patient started on empiric community pneumonia treatment.  That showed concern for pulmonary edema versus multifocal pneumonia. Pro-cal, and lactic acid are pending.  Patient was admitted to the ICU.  Pertinent Medical History:  Hx of chronic respiratory failure on home O2, PAF on AC, CAD, ischemic cardiomyopathy, ICD in place, CKD stage IV, COPD, dementia, GERD, hypertension, hyperlipidemia, history of CVA.  He has a history of multivessel disease status post CABG x4 in 2014 at [facility] Events: Including procedures, antibiotic start and stop dates in addition to other pertinent events   07/17-Presented to the ED and intubated. Admitted to ICU. 7/19 Weaning on vent, PSV 5/5 FiO2 40%. Following commands. Remains tachy to 110s.  Interim History / Subjective:  No significant events overnight Sitting up in bed, calm and appropriate this AM PSV 5/5, FiO2 40%.  Occasional apnea with sleep, wakes with vent alarm. If extubating today, will need to extubate to BiPAP CXR with improved bilateral lung opacities Tachy to 110s despite current interventions Slightly decreased UOP (981mL/24H), Cr up 3.54 (3.23) BCx 7/17 with bacillus in anaerobic bottle only  Objective:  Blood pressure (!) 144/70, pulse (!) 113, temperature 98.1 F (36.7 C), temperature source Oral, resp. rate (!) 25, height 5\' 9"  (1.753 m), weight 83 kg, SpO2 99 %.    Vent Mode: PRVC FiO2 (%):  [40 %] 40 % Set Rate:  [24 bmp] 24 bmp Vt Set:  [560 mL] 560 mL PEEP:  [5 cmH20] 5 cmH20 Plateau Pressure:  [13 cmH20-20 cmH20] 18 cmH20   Intake/Output Summary (Last 24 hours) at 07/13/2022 0805 Last data filed at 07/13/2022 0700 Gross per 24 hour  Intake 2757.35 ml  Output 900 ml  Net 1857.35 ml    Filed Weights   07/11/22 1730 07/12/22 0500 07/13/22 0500  Weight: 81.8 kg 82 kg 83 kg   Physical Examination: General: Ill-appearing elderly man in NAD. HEENT: [place]/AT, anicteric sclera, PERRL, moist mucous membranes. ETT/OGT in place. Neuro:  Awake, unable to assess orientation but nods appropriate to questions.  Responds to verbal stimuli. Following commands consistently. Moves all 4 extremities spontaneously. +Cough and +Gag  CV: Tachycardic, regular rhythm, no m/g/r. PULM: Breathing even and unlabored on vent (PSV 5/5, FiO2 40%). Lung fields coarse bilaterally in upper fields. GI: Soft, nontender, mildly distended. Normoactive bowel sounds. Extremities: Trace symmetric BLE edema noted. Skin: Warm/dry, no rashes.  Resolved Hospital Problem List:   Assessment & Plan:  Acute on  chronic hypoxic respiratory failure 2/2 HF exacerbation Hx of HFrEF  Hx of COPD Presented with dyspnea and oxygen sats in 80s and HR in 130s.  Likely appears secondary to pulmonary edema due to his heart failure.  Patient's Echo in 02/2022 shows G1DD, EF 40 %. There may be a component of flash pulmonary edema  given his hypertensive blood pressures on presentation.  But there is a chronic worsening of his heart failure as he has been having worsening dyspnea and orthopnea for the past couple weeks.  Currently patient is getting antibiotics for pneumonia coverage, but given no infectious symptoms low threshold to discontinue them.  Procal pending and will use it to guide antibiotic use. - Tolerating vent wean 7/19AM, possible extubation attempt early afternoon if continuing to tolerate well - Continue vent support (4-8cc/kg IBW) - Wean FiO2 for O2 sat > 90% - Daily WUA/SBT, possible extubation today 7/19 pending progress - VAP bundle - Bronchodilators (Albuterol, Pulmicort, DuoNeb, Brovana/Yupelri) - Pulmonary hygiene - PAD protocol for sedation: Propofol and Fentanyl for goal RASS 0 to -1 - Continue doxycycline for CAP coverage  Tachycardia PAF HTN - Cardiac monitoring - Remains on amiodarone - Continue Coreg, may need to uptitrate - Continue heparin gtt for now - Transition back to Eliquis as appropriate - Continue hydralazine PRN  CKD IV Chronic.  Patient sedated so we will hold off some of the patient's blood pressure medications but continue his Coreg given his tachycardia. - Trend BMP - Replete electrolytes as indicated - Monitor I&Os - Avoid nephrotoxic agents as able - Ensure adequate renal perfusion - Nephro consult 7/19  HLD CAD Chronic. - Continue ASA, statin  GERD Chronic. - PPI  Gout Chronic.  - Continue Allopurinol  At risk for malnutrition - Nutrition/RD consult - TF  Hyperglycemia - CBGs Q4H - SSI  Best Practice: (right click and "Reselect all SmartList Selections" daily)  Diet/type: NPO DVT prophylaxis: DOAC GI prophylaxis: PPI Lines: N/A Foley:  N/A Continuous: Propofol   Code Status:  full code Last date of multidisciplinary goals of care discussion: 07/13/2022 updated wife at bedside discussion of CODE STATUS remains full code.  Critical care  time:   The patient is critically ill with multiple organ system failure and requires high complexity decision making for assessment and support, frequent evaluation and titration of therapies, advanced monitoring, review of radiographic studies and interpretation of complex data.   Critical Care Time devoted to patient care services, exclusive of separately billable procedures, described in this note is 41 minutes  Lestine Mount, PA-C [facility] 07/13/22 8:05 AM  Please see [email or web address] for pager details.  From 7A-7P if no response, please call [phone] After hours, please call ELink [phone]

## 2022-07-13 NOTE — Progress Notes (Addendum)
Progress Note  [name] Date of Encounter: 07/13/2022  [facility] Cardiologist: Donato Heinz, MD   Subjective   Awake, wants tube out, thirsty  Inpatient Medications    Scheduled Meds:  allopurinol  100 mg Per Tube Daily   arformoterol  15 mcg Nebulization BID   aspirin  81 mg Per Tube Daily   atorvastatin  40 mg Per Tube Daily   budesonide (PULMICORT) nebulizer solution  0.5 mg Nebulization BID   carvedilol  25 mg Per Tube BID   Chlorhexidine Gluconate Cloth  6 each Topical Daily   doxazosin  1 mg Per Tube Daily   feeding supplement (PROSource TF)  45 mL Per Tube TID   insulin aspart  0-9 Units Subcutaneous Q4H   ipratropium-albuterol  3 mL Nebulization Q6H   loratadine  10 mg Per Tube Daily   mouth rinse  15 mL Mouth Rinse Q2H   pantoprazole sodium  40 mg Per Tube Daily   revefenacin  175 mcg Nebulization Daily   Continuous Infusions:  amiodarone 30 mg/hr (07/13/22 0700)   doxycycline (VIBRAMYCIN) IV Stopped (07/13/22 0137)   feeding supplement (VITAL 1.5 CAL) 50 mL/hr at 07/13/22 0700   fentaNYL infusion INTRAVENOUS 50 mcg/hr (07/13/22 0700)   heparin 1,150 Units/hr (07/13/22 0700)   propofol (DIPRIVAN) infusion Stopped (07/13/22 0545)   PRN Meds: acetaminophen, albuterol, docusate, fentaNYL, hydrALAZINE, ondansetron (ZOFRAN) IV, mouth rinse, polyethylene glycol   Vital Signs    Vitals:   07/13/22 0600 07/13/22 0700 07/13/22 0758 07/13/22 0816  BP: (!) 156/79 (!) 131/120 (!) 144/70   Pulse: (!) 108 (!) 111 (!) 113   Resp: (!) 25 (!) 24 (!) 25   Temp:      TempSrc:      SpO2: 99% 98% 99% 97%  Weight:      Height:        Intake/Output Summary (Last 24 hours) at 07/13/2022 0830 Last data filed at 07/13/2022 0700 Gross per 24 hour  Intake 2757.35 ml  Output 900 ml  Net 1857.35 ml      07/13/2022    5:00 AM 07/12/2022    5:00 AM 07/11/2022    5:30 PM  Last 3 Weights  Weight (lbs) 182 lb 15.7 oz 180 lb 12.4 oz 180 lb 5.4 oz   Weight (kg) 83 kg 82 kg 81.8 kg      Telemetry    C/w intermittent some short, some hours of ATach as well as AP rates - Personally Reviewed  ECG    No new EKGs - Personally Reviewed  Physical Exam   EN: No acute distress.   Neck: No JVD Cardiac: RRR, tachycardic, no murmurs, rubs, or gallops.  Respiratory: intubated, diminished at the bases GI: Soft, nontender, non-distended  MS: No edema; No deformity. Neuro:  Nonfocal  Psych: Normal affect   Labs    High Sensitivity Troponin:   Recent Labs  Lab 07/11/22 1352 07/11/22 2259 07/12/22 0121 07/12/22 0618  TROPONINIHS 21* 833* 990* 1,166*     Chemistry Recent Labs  Lab 07/11/22 1352 07/11/22 1435 07/12/22 1326 07/12/22 1621 07/12/22 2102 07/13/22 0613  NA 137   < >  --  141 140 140  K 4.3   < >  --  3.8 3.9 4.0  CL 101   < >  --  106 104 106  CO2 22   < >  --  21* 20* 22  GLUCOSE 146*   < >  --  154* 160* 152*  BUN 28*   < >  --  38* 38* 47*  CREATININE 3.00*   < >  --  3.31* 3.23* 3.54*  CALCIUM 9.7   < >  --  9.7 9.9 9.4  MG 2.3   < > 2.5* 2.4  --  2.4  PROT 7.3  --   --   --   --  6.3*  ALBUMIN 4.0  --   --   --   --  3.4*  AST 27  --   --   --   --  36  ALT 20  --   --   --   --  16  ALKPHOS 150*  --   --   --   --  104  BILITOT 0.8  --   --   --   --  0.6  GFRNONAA 21*   < >  --  18* 19* 17*  ANIONGAP 14   < >  --  14 16* 12   < > = values in this interval not displayed.    Lipids  Recent Labs  Lab 07/12/22 0121  TRIG 191*    Hematology Recent Labs  Lab 07/11/22 1352 07/11/22 1435 07/12/22 0121 07/12/22 1033 07/13/22 0613  WBC 13.4*  --  7.6  --  10.7*  RBC 3.43*  --  3.20*  --  3.13*  HGB 10.8*   < > 9.8* 10.5* 9.7*  HCT 35.0*   < > 30.3* 31.0* 29.3*  MCV 102.0*  --  94.7  --  93.6  MCH 31.5  --  30.6  --  31.0  MCHC 30.9  --  32.3  --  33.1  RDW 16.9*  --  16.5*  --  17.2*  PLT 352  --  222  --  247   < > = values in this interval not displayed.   Thyroid  Recent Labs  Lab  07/12/22 1621  TSH 1.029    BNP Recent Labs  Lab 07/11/22 1352  BNP 777.0*    DDimer No results for input(s): "DDIMER" in the last 168 hours.   Radiology     DG Abd Portable 1V Result Date: 07/11/2022 CLINICAL DATA:  Orogastric tube placement EXAM: PORTABLE ABDOMEN - 1 VIEW COMPARISON:  12/26/2017 FINDINGS: Orogastric tube enters the stomach with its tip in the mid body. Gas pattern unremarkable. IMPRESSION: Tube tip in the mid body of the stomach. Electronically Signed   By: Nelson Chimes M.D.   On: 07/11/2022 18:05   DG Chest Portable 1 View Result Date: 07/11/2022 CLINICAL DATA:  A 76 year old male presents post intubation for assessment. EXAM: PORTABLE CHEST 1 VIEW COMPARISON:  April 15, 2022 FINDINGS: Endotracheal tube in place 7.2 cm from the carina, tip between clavicular heads. Gastric tube courses through in off the field the radiograph into the upper abdomen. Multi lead pacer device in place, power pack over the LEFT chest. EKG leads over the chest. Post median sternotomy. Stable cardiomediastinal contours. Diffuse interstitial and alveolar opacities throughout both LEFT and RIGHT chest slightly worse in the RIGHT lower chest. No lobar consolidation. Blunting of LEFT costodiaphragmatic sulcus no pneumothorax. On limited assessment there is no acute skeletal process. IMPRESSION: 1. Endotracheal tube 7.2 cm from the carina. 2. Diffuse interstitial and alveolar opacities throughout both lungs, slightly worse in the RIGHT lower chest. Pattern of disease could reflect asymmetric pulmonary edema or atypical/multifocal infection 3. Question small left effusion. 4. Gastric  tube courses through off the field the radiograph into the upper abdomen. Electronically Signed   By: Zetta Bills M.D.   On: 07/11/2022 14:12    Cardiac Studies   07/12/22: TTE 1. Left ventricular ejection fraction, by estimation, is 40 to 45%. The  left ventricle has mildly decreased function. The left ventricle   demonstrates regional wall motion abnormalities (see scoring  diagram/findings for description). Left ventricular  diastolic function could not be evaluated.   2. Right ventricular systolic function is normal. The right ventricular  size is normal. There is mildly elevated pulmonary artery systolic  pressure. The estimated right ventricular systolic pressure is 95.6 mmHg.   3. The mitral valve is grossly normal. Trivial mitral valve  regurgitation. No evidence of mitral stenosis.   4. The aortic valve is tricuspid. Aortic valve regurgitation is not  visualized. No aortic stenosis is present.   5. The inferior vena cava is normal in size with <50% respiratory  variability, suggesting right atrial pressure of 8 mmHg.   Comparison(s): No significant change from prior study.   Monitor may-June 2023 Patch Wear Time:  8 days and 4 hours (2023-05-08T13:37:46-0400 to 2023-06-01T10:10:26-0400)  Monitor 1 1. Sinus rhythm - avg HR of 117 bpm. (average rate in sinus was 75 bpm) 2. 20 runs of Supraventricular Tachycardia occurred, the run with the fastest interval lasting 44 mins 18 secs with a max rate of 152 bpm,the longest lasting 1 hour 10 mins with an avg rate of 120 bpm 3. Isolated PACs were rare (<1.0%) 4. Isolated PVCs were occasional (2.1%, 1024) 5. No patient-triggered events reported   Monitor 2 1. Sinus rhythm - avg HR of 73 bpm.  2. 1723 runs of Supraventricular Tachycardia occurred, the run with the fastest interval lasting 8 mins 22 secs with a max rate  of 207 bpm, the longest lasting 32 mins 13 secs with an avg rate of 125 bpm. 3. Isolated PACs were occasional (3.3%, 38756) 4.  Isolated PCss were frequent (6.0%, 51417) 5. Ventricular Bigeminy and Trigeminy were present.   Echocardiogram 03/08/22   1. Left ventricular ejection fraction, by estimation, is 40 to 45%. The  left ventricle has mildly decreased function. The left ventricle  demonstrates regional wall motion  abnormalities (see scoring  diagram/findings for description). Left ventricular  diastolic parameters are consistent with Grade I diastolic dysfunction  (impaired relaxation).   2. Right ventricular systolic function is normal. The right ventricular  size is normal. There is normal pulmonary artery systolic pressure.   3. Left atrial size was mildly dilated.   4. Right atrial size was mildly dilated.   5. The mitral valve is normal in structure. Trivial mitral valve  regurgitation.   6. Tricuspid valve regurgitation is mild to moderate.   7. The aortic valve is tricuspid. Aortic valve regurgitation is not  visualized.   8. The inferior vena cava is normal in size with greater than 50%  respiratory variability, suggesting right atrial pressure of 3 mmHg.   Comparison(s): No significant change from prior study. Prior images  reviewed side by side.    12/29/2017: LHC Mid LM lesion is 75% stenosed. Ost LAD to Prox LAD lesion is 100% stenosed. Ost 1st Mrg lesion is 100% stenosed. Prox Cx to Mid Cx lesion is 100% stenosed. Prox RCA to Mid RCA lesion is 100% stenosed. LIMA graft was visualized by angiography and is normal in caliber. The graft exhibits no disease. Seq SVG- OM 1 and PDA graft was visualized  by angiography and is normal in caliber. SVG graft was visualized by angiography and is normal in caliber. Origin lesion before 1st Mrg is 50% stenosed. LV end diastolic pressure is moderately elevated.   1. Severe 3 vessel occlusive CAD 2. Patent LIMA to the LAD 3. Patent sequential SVG to the first OM and PDA. Patent Y graft SVG to the diagonal 4. Moderately elevated LVEDP of 30 mm Hg.   Plan: medical management. Diuresis per primary team.      12/27/2017 Study Conclusions - Left ventricle: The cavity size was normal. There was mild   concentric hypertrophy. Systolic function was mildly to   moderately reduced. The estimated ejection fraction was in the   range of 40% to 45%.  Features are consistent with a pseudonormal   left ventricular filling pattern, with concomitant abnormal   relaxation and increased filling pressure (grade 2 diastolic   dysfunction). Doppler parameters are consistent with elevated   mean left atrial filling pressure. - Aortic valve: There was no regurgitation. - Mitral valve: There was trivial regurgitation. - Right ventricle: The cavity size was mildly dilated. Wall   thickness was normal. Pacer wire or catheter noted in right   ventricle. Systolic function was normal. - Right atrium: Pacer wire or catheter noted in right atrium. - Tricuspid valve: There was mild regurgitation. - Pulmonary arteries: Systolic pressure was moderately increased.   PA peak pressure: 53 mm Hg (S). - Inferior vena cava: The vessel was normal in size. - Pericardium, extracardiac: There was no pericardial effusion.    Impressions:   - Since the prior study on 01/18/2016 LVEF has decreased from 50-55%   to 40-45% with basal and mid inferolateral and anterolateral   hypokinesis.      Patient Profile     76 y.o. male  hx of ICM, CAD s/p CABG, AFib, CKD, COPD, HTN, OSA, dementia and prior TIA, sinus node dysfunction PPM, CKD (IV) Jan 2019 hospitalized with a pneumonia, during this stay he suffered a VF arrest, underwent cath >> patent grafts, LVEF 40-45%, no interventions, his PPM upgraded to ICD. who is being seen 07/12/2022 for the evaluation of SVT at the request of Dr. Margaretann Loveless.     Device information SJM dual chamber ICD, (PPM upgraded) 12/29/17, RA lead is from 2014, he has an abandoned RV pacing lead Secondary prevention  Admitted 07/11/22 with progressive SOB, fatigue, perhaps over the last month, SOB became acutely worse and EMS called, treated initially with NEB and then CPAP but was intolerant and on arrival to the ER being ventilated by BVM and promptly intubated in the ER   Cardiology consuted 7/18, EP consulted 7/18 for tachycardia  Assessment &  Plan    SVT Suspect an ATach Baseline TSH  yesterday done and wnl Amiodarone the only option currently, but  failed previously with the development of hyperthyroidism   Device check yesterday noted a very low over all burden of HR 110's In review with Dr. Lovena Le yesterday (did see the patient yesterday) ATach is likely a symptom not a trigger for his respiratory failure   AFib CHA2DS2Vasc is 6, on Eliquis outpt transitioned to heparin gtt here Very low AF burden of <1% (Since May his last device interrogation   3. Respiratory failure CHF/COPD Device/CorVue would suggest that he has been volume OL< but trending upwards and now is dry (?) after 2 doses of 80mg  IV lasix Volume neg day one Fairly euvolemic by yesterday's charting, weight [place] up a couple  pounds by bed weights COPD CCM managing, cards on board as well  4. AKI/CKD (IV) Creat rising 3.54 today (baseline looks towards 2.5-2.7) Deferred to CCM/cardiology  5. CM LVEF this admission is unchanged 40-45%, + WMA (new in March)   Dr. Caryl Comes will see later today Hopefully he can extubate today/soon  For questions or updates, please contact [facility] Please consult [email or web address] for contact info under        Signed, Baldwin Jamaica, PA-C  07/13/2022, 8:30 AM    Atrial tachycardia  Respiratory failure-multifactorial cardiac and pulmonary-proving  Renal insufficiency acute/chronic  Cardiomyopathy-ischemic prior bypass  Previous exposure to amiodarone  (This note may be a duplicate as I remember dictating this before but I cannot find it in the patient's chart)  I do not think the patient's atrial tachycardia is the cause of the deterioration; the rates are not so fast and there is maintained AV synchrony.  Moreover, according to the wife, he has felt lousy for months, and based on the interrogation he has only been atrial tachycardia about 10% of the time.  It may be aggravating his respiratory status and I  think trying to suppress it is appropriate.  I would use amiodarone.  Options are very limited.  I have reviewed the chart extensively, according to the wife, the amiodarone was stopped because of corneal deposits; this should not inform decision-making.  Moreover, the hyperthyroidism which was identified was never really associated with significantly low TSH and may have been concomitant as opposed to consequential.  Hence, we will continue amiodarone.  We will transition to p.o. when taking p.o.  40 mg twice daily for 2 weeks then 200 mg twice daily then 200 mg a day.  We will follow from [place]

## 2022-07-13 NOTE — Consult Note (Signed)
Nephrology Consult    Assessment/Recommendations:   AKI on CKD4: Likely secondary to diuresis. Follows w/ Dr. Joelyn Oms for CKD care, baseline Cr around 2.4-2.8, underlying CKD likely related to HTN/ASCVD -agree with holding diuretics for today -will check UA and renal u/s for completion -bladder scan especially as it seems that he is retaining urine, discussed w/ RN -did discuss briefly with wife and patient, that if his kidney function does not respond to medical therapy then we may need to consider renal replacement therapy down the road especially given his pre-existing CKD 4. Hopefully, this will not be the case but will follow along -Continue to monitor daily Cr, Dose meds for GFR<15 -Maintain MAP>65 for optimal renal perfusion.  -Avoid nephrotoxic medications including NSAIDs and iodinated intravenous contrast exposure unless the latter is absolutely indicated.  Preferred narcotic agents for pain control are hydromorphone, fentanyl, and methadone. Morphine should not be used. Avoid Baclofen and avoid oral sodium phosphate and magnesium citrate based laxatives / bowel preps. Continue strict Input and Output monitoring. Will monitor the patient closely with you and intervene or adjust therapy as indicated by changes in clinical status/labs   AHRF secondary to acute on chronic COPD and acute on chronic HFrEF -s/p diuresis which is currently on hold -cardio following. COPD management per primary service -extubated 7/19  Hypertension: -BP acceptable  Anemia due to chronic disease: -Transfuse for Hgb<7 g/dL -will check an iron panel  PAF -on amio and hep gtt  Recommendations conveyed to primary service.    [facility] 07/13/2022 1:36 PM   _____________________________________________________________________________________   History of Present Illness: Joe Wiggins is a/an 76 y.o. male with a past medical history of CKD 4 (followed by Dr. Joelyn Oms),  chronic respiratory failure on home O2, CAD status post CABG x4 with ischemic cardiomyopathy status post ICD, paroxysmal A-fib, COPD, dementia, GERD, hypertension, hyperlipidemia, history of CVA who presents to [facility] with worsening shortness of breath.  Was intubated on arrival given severe respiratory distress.  Extubated today.  Was receiving Lasix and based on his device bioimpedance, it suggested that he may have been relatively dry therefore Lasix is on hold now.  Discussed with ICU RN, there has been no urine output today (has a condom catheter), did require in and out straight cath yesterday, to be bladder scanned.  Wife at bedside in ICU.  Patient reports that he feels pretty good after being extubated.  No chest pain, nausea/vomiting.   Medications:  Current Facility-Administered Medications  Medication Dose Route Frequency Provider Last Rate Last Admin   acetaminophen (TYLENOL) tablet 650 mg  650 mg Per Tube Q4H PRN Idamae Schuller, MD       albuterol (PROVENTIL) (2.5 MG/3ML) 0.083% nebulizer solution 2.5 mg  2.5 mg Nebulization Q6H PRN Hunsucker, Bonna Gains, MD       allopurinol (ZYLOPRIM) tablet 100 mg  100 mg Per Tube Daily Idamae Schuller, MD   100 mg at 07/13/22 0950   amiodarone (NEXTERONE PREMIX) 360-4.14 MG/200ML-% (1.8 mg/mL) IV infusion  30 mg/hr Intravenous Continuous Julian Hy, DO 16.67 mL/hr at 07/13/22 1231 30 mg/hr at 07/13/22 1231   arformoterol (BROVANA) nebulizer solution 15 mcg  15 mcg Nebulization BID Hunsucker, Bonna Gains, MD   15 mcg at 07/13/22 0757   aspirin chewable tablet 81 mg  81 mg Per Tube Daily Hunsucker, Bonna Gains, MD   81 mg at 07/13/22 0950   atorvastatin (LIPITOR) tablet 40 mg  40 mg Per Tube Daily Idamae Schuller,  MD   40 mg at 07/12/22 2213   budesonide (PULMICORT) nebulizer solution 0.5 mg  0.5 mg Nebulization BID Idamae Schuller, MD   0.5 mg at 07/13/22 0758   carvedilol (COREG) tablet 25 mg  25 mg Per Tube BID Dunn, Dayna N, PA-C   25 mg at 07/13/22 0950    Chlorhexidine Gluconate Cloth 2 % PADS 6 each  6 each Topical Daily Hunsucker, Bonna Gains, MD   6 each at 07/13/22 0549   docusate (COLACE) 50 MG/5ML liquid 100 mg  100 mg Per Tube BID PRN Hunsucker, Bonna Gains, MD       doxazosin (CARDURA) tablet 1 mg  1 mg Per Tube Daily Noemi Chapel P, DO   1 mg at 07/13/22 0950   doxycycline (VIBRA-TABS) tablet 100 mg  100 mg Per Tube Q12H Noemi Chapel P, DO       feeding supplement (PROSource TF) liquid 45 mL  45 mL Per Tube TID Noemi Chapel P, DO   45 mL at 07/13/22 0950   feeding supplement (VITAL 1.5 CAL) liquid 1,000 mL  1,000 mL Per Tube Continuous Noemi Chapel P, DO 50 mL/hr at 07/13/22 0700 Infusion Verify at 07/13/22 0700   fentaNYL (SUBLIMAZE) bolus via infusion 50 mcg  50 mcg Intravenous Q1H PRN Hunsucker, Bonna Gains, MD   50 mcg at 07/12/22 2233   fentaNYL 2546mcg in NS 29mL (72mcg/ml) infusion-PREMIX  0-200 mcg/hr Intravenous Continuous Hunsucker, Bonna Gains, MD 5 mL/hr at 07/13/22 0700 50 mcg/hr at 07/13/22 0700   heparin ADULT infusion 100 units/mL (25000 units/229mL)  1,050 Units/hr Intravenous Continuous Noemi Chapel P, DO 10.5 mL/hr at 07/13/22 1231 1,050 Units/hr at 07/13/22 1231   hydrALAZINE (APRESOLINE) injection 20 mg  20 mg Intravenous Q6H PRN Hunsucker, Bonna Gains, MD   20 mg at 07/12/22 0759   insulin aspart (novoLOG) injection 0-9 Units  0-9 Units Subcutaneous Q4H Hunsucker, Bonna Gains, MD   2 Units at 07/13/22 0424   ipratropium-albuterol (DUONEB) 0.5-2.5 (3) MG/3ML nebulizer solution 3 mL  3 mL Nebulization Q6H Hunsucker, Bonna Gains, MD   3 mL at 07/13/22 0757   loratadine (CLARITIN) tablet 10 mg  10 mg Per Tube Daily Idamae Schuller, MD   10 mg at 07/13/22 0950   ondansetron (ZOFRAN) injection 4 mg  4 mg Intravenous Q6H PRN Idamae Schuller, MD       Oral care mouth rinse  15 mL Mouth Rinse Q2H Hunsucker, Bonna Gains, MD   15 mL at 07/13/22 0956   Oral care mouth rinse  15 mL Mouth Rinse PRN Hunsucker, Bonna Gains, MD       pantoprazole sodium  (PROTONIX) 40 mg/20 mL oral suspension 40 mg  40 mg Per Tube Daily Idamae Schuller, MD   40 mg at 07/13/22 0950   polyethylene glycol (MIRALAX / GLYCOLAX) packet 17 g  17 g Per Tube Daily PRN Hunsucker, Bonna Gains, MD       propofol (DIPRIVAN) 1000 MG/100ML infusion  5-80 mcg/kg/min Intravenous Continuous Lucrezia Starch, MD   Stopped at 07/13/22 0545   revefenacin (YUPELRI) nebulizer solution 175 mcg  175 mcg Nebulization Daily Hunsucker, Bonna Gains, MD   175 mcg at 07/13/22 0370     ALLERGIES Ipratropium, Metoprolol, Morphine and related, Vicodin [hydrocodone-acetaminophen], and Penicillins  MEDICAL HISTORY Past Medical History:  Diagnosis Date   Abrasion of forearm, right 07/27/2019   Acute respiratory failure (HCC)    AF (paroxysmal atrial fibrillation) ([place])    AICD (automatic cardioverter/defibrillator)  present    CAD (coronary artery disease)    Cardiac arrest ([place])    Cardiomyopathy, ischemic    Carotid artery occlusion    Chronic HFrEF (heart failure with reduced ejection fraction) (HCC)    Chronic respiratory failure (HCC)    CKD (chronic kidney disease), stage IV (HCC)    COPD (chronic obstructive pulmonary disease) (HCC)    Cough 11/13/2019   Dementia without behavioral disturbance (HCC)    Dizziness 01/17/2016   Facial rash 08/15/2018   Frequent PVCs    GERD (gastroesophageal reflux disease)    Hyperlipidemia    Hypertension    ICD (implantable cardioverter-defibrillator) in place    Ischemic cardiomyopathy    Paroxysmal atrial flutter (HCC)    Pre-syncope 01/16/2018   Premature atrial contractions    PSVT (paroxysmal supraventricular tachycardia) ([place])    Respiratory distress 12/27/2020   Sleep apnea    Stroke ([place])    TIA (transient ischemic attack)      SOCIAL HISTORY Social History   Socioeconomic History   Marital status: Married    Spouse name: Souleymane Saiki   Number of children: 2   Years of education: Not on file   Highest education level: 11th  grade  Occupational History   Occupation: Retired    Comment: Financial planner  Tobacco Use   Smoking status: Former    Types: Cigarettes    Quit date: 11/30/2012    Years since quitting: 9.6   Smokeless tobacco: Never  Vaping Use   Vaping Use: Never used  Substance and Sexual Activity   Alcohol use: No    Comment: last drink 10 + years   Drug use: No   Sexual activity: Not on file  Other Topics Concern   Not on file  Social History Narrative   Not on file   Social Determinants of Health   Financial Resource Strain: Low Risk  (03/10/2022)   Overall Financial Resource Strain (CARDIA)    Difficulty of Paying Living Expenses: Not hard at all  Food Insecurity: No Food Insecurity (03/10/2022)   Hunger Vital Sign    Worried About Running Out of Food in the Last Year: Never true    Ran Out of Food in the Last Year: Never true  Transportation Needs: No Transportation Needs (03/10/2022)   PRAPARE - Hydrologist (Medical): No    Lack of Transportation (Non-Medical): No  Physical Activity: Not on file  Stress: Not on file  Social Connections: Not on file  Intimate Partner Violence: Not on file     FAMILY HISTORY Family History  Problem Relation Age of Onset   Hypertension Mother    Heart disease Mother    Hypertension Sister    Heart disease Sister    Thyroid disease Sister    Thyroid disease Brother        hyperthyroid    Review of Systems: 12 systems reviewed Otherwise as per HPI, all other systems reviewed and negative  Physical Exam: Vitals:   07/13/22 1128 07/13/22 1200  BP: (!) 151/76   Pulse: 81   Resp: 18   Temp:  98.2 F (36.8 C)  SpO2: 99%    No intake/output data recorded.  Intake/Output Summary (Last 24 hours) at 07/13/2022 1336 Last data filed at 07/13/2022 0700 Gross per 24 hour  Intake 2170.41 ml  Output 900 ml  Net 1270.41 ml   General: no acute distress HEENT: no JVD CV: regular rate, normal rhythm Lungs:  poor air exchange bilaterally, normal WOBB, BIPAP in place Abd: soft, non-tender, distended Skin: no visible lesions or rashes Musculoskeletal: trace pedal edema b/l Neuro: awake, alert, tremulous  Test Results Reviewed Lab Results  Component Value Date   NA 140 07/13/2022   K 4.0 07/13/2022   CL 106 07/13/2022   CO2 22 07/13/2022   BUN 47 (H) 07/13/2022   CREATININE 3.54 (H) 07/13/2022   GLU 127 01/10/2018   CALCIUM 9.4 07/13/2022   ALBUMIN 3.4 (L) 07/13/2022   PHOS 3.7 07/13/2022     I have reviewed all relevant outside healthcare records related to the patient's kidney injury.

## 2022-07-13 NOTE — Progress Notes (Signed)
NAME:  Joe Wiggins, MRN:  [id number], DOB:  22-Jun-1946, LOS: 2 ADMISSION DATE:  07/11/2022, CONSULTATION DATE:  07/11/22 REFERRING MD:  EDP CHIEF COMPLAINT:  Acute Hypoxic Respiratory Failure   History of Present Illness:  Patient with PMHx noted below presenting to ED with worsening shortness of breath. His wife states this has been an issue for the patient for 3-4 weeks but it was exertional and this morning patient had worsening dyspnea that did not resolve. Wife states patient did not have any fevers, cough, n/v/d. He has been complaining of worsening dyspnea and orthopnea. She endorsed he is adherent to his medications.  His dyspnea worsened acutely and EMS was called.  They used nonrebreather and CPAP but the patient did not tolerate.  Patient was receiving bag-valve-mask on ED arrival and was intubated on arrival. Showed leukocytosis at 13.4k.  CMP showed creatinine at 3.0 from baseline of 2.4-2.5.  Troponins were mildly elevated.  BNP elevated at 777.  ABG obtained showed 7.3/63.4/227.  Blood cultures were obtained and patient started on empiric community pneumonia treatment.  That showed concern for pulmonary edema versus multifocal pneumonia. Pro-cal, and lactic acid are pending.  Patient was admitted to the ICU.  Pertinent  Medical History  Hx of chronic respiratory failure on home O2, PAF on AC, CAD, ischemic cardiomyopathy, ICD in place, CKD stage IV, COPD, dementia, GERD, hypertension, hyperlipidemia, history of CVA.  He has a history of multivessel disease status post CABG x4 in 2014 at [facility] Events: Including procedures, antibiotic start and stop dates in addition to other pertinent events   07/17-Presented to the ED and intubated. Admitted to ICU.  7/18 diuresed, switched to amiodarone  Interim History / Subjective:  He denies complaints today other than wanting to eat.   Objective   Blood pressure (!) 156/76, pulse (!) 112, temperature 98 F (36.7 C),  temperature source Axillary, resp. rate (!) 25, height 5\' 9"  (1.753 m), weight 83 kg, SpO2 97 %.    Vent Mode: PSV;CPAP FiO2 (%):  [40 %] 40 % Set Rate:  [24 bmp] 24 bmp Vt Set:  [560 mL] 560 mL PEEP:  [5 cmH20] 5 cmH20 Pressure Support:  [5 cmH20-8 cmH20] 5 cmH20 Plateau Pressure:  [13 cmH20-20 cmH20] 18 cmH20   Intake/Output Summary (Last 24 hours) at 07/13/2022 1025 Last data filed at 07/13/2022 0700 Gross per 24 hour  Intake 2388.33 ml  Output 900 ml  Net 1488.33 ml    Filed Weights   07/11/22 1730 07/12/22 0500 07/13/22 0500  Weight: 81.8 kg 82 kg 83 kg   Examination: General: chronically ill appearing man lying in bed in NAD, intubated, lightly sedated HENT: [place]/aT, eyes anicteric, bleeding from lip improved Lungs: CTAB, breathing comfortably on 5/5. Cardiovascular: S1S2, reg rate and rhythm Abdomen: soft, NT Extremities: mild pedal and ankle edema, no cyanosis Neuro: awake, alert, moving extremities GU:  minimal light yellow urine in bag  BUN 47 Cr 3.54 WBC 10.7 H/H 9.7/29.3 Covid negative TSH 1.029 Echo> LVEF 40-45%,  Mildly elevated PASP, normal RV function. Trivial MR. IVC normal size, reduced variability.   Resolved Hospital Problem list    Assessment & Plan:   Acute respiratory failure with hypoxia and hypercapnia requiring MV Acute pulmonary edema Acute COPD exacerbation Very low suspicion for pneumonia with low PCT x 2 -Lasix on hold with increasing Cr and PPM data supporting improvement in volume status.  -recheck BNP today -con't bronchodilators -con't doxycycline -LTVV -VAP prevention protocol -PAD protocol  for sedation -daily SAT & SBT   Acute on chronic HFrEF LBBB -Coreg dose resumed by Cardiology -Con't amiodarone -Con't heparin, holding eliquis while in ICU -Likely can resume hydralazine today- will defer to Cardiology. Need to maintain renal perfusion.  -Not a candidate for Entresto/ARB/ACE or spironolactone due to renal failure-  chronic issue   Afib with RVR -con't heparin, once out of ICU can resume eliquis -amiodarone started, coreg dose reduced compared to home dose -monitor electrolytes, replet PEN   AKI on CKD 4 -off lasix -maintain adequate renal perfusion -renally dose meds, avoid nephrotoxic meds -strict I/Os -Nephrology consult today   Hyperglycemia -SSI PRN -goal BG 140-180   Concern for BPH -con't doxazosin (new this admission)  H/o gout -allopurinol; ok at this dose with current renal function.  Patient and wife updated at bedside during rounds.   Best Practice (right click and "Reselect all SmartList Selections" daily)  Diet/type: NPO DVT prophylaxis: DOAC GI prophylaxis: PPI Lines: N/A Foley:  N/A Continuous: Propofol   Code Status:  full code Last date of multidisciplinary goals of care discussion [0717] 07/12/2022 updated wife at bedside discussion of CODE STATUS remains full code. Labs   CBC: Recent Labs  Lab 07/11/22 1352 07/11/22 1435 07/12/22 0121 07/12/22 1033 07/13/22 0613  WBC 13.4*  --  7.6  --  10.7*  NEUTROABS 9.8*  --   --   --   --   HGB 10.8* 10.5* 9.8* 10.5* 9.7*  HCT 35.0* 31.0* 30.3* 31.0* 29.3*  MCV 102.0*  --  94.7  --  93.6  PLT 352  --  222  --  572    Basic Metabolic Panel: Recent Labs  Lab 07/11/22 1352 07/11/22 1435 07/12/22 0121 07/12/22 1033 07/12/22 1326 07/12/22 1621 07/12/22 2102 07/13/22 0613  NA 137   < > 139 139  --  141 140 140  K 4.3   < > 3.5 3.9  --  3.8 3.9 4.0  CL 101  --  104  --   --  106 104 106  CO2 22  --  21*  --   --  21* 20* 22  GLUCOSE 146*  --  155*  --   --  154* 160* 152*  BUN 28*  --  29*  --   --  38* 38* 47*  CREATININE 3.00*  --  3.02*  --   --  3.31* 3.23* 3.54*  CALCIUM 9.7  --  9.5  --   --  9.7 9.9 9.4  MG 2.3  --  2.3  --  2.5* 2.4  --  2.4  PHOS  --   --   --   --  2.2* 2.7  --  3.7   < > = values in this interval not displayed.    GFR: Estimated Creatinine Clearance: 17.5 mL/min (A) (by C-G  formula based on SCr of 3.54 mg/dL (H)). Recent Labs  Lab 07/11/22 1352 07/11/22 2259 07/12/22 0121 07/12/22 0131 07/12/22 0618 07/13/22 6203  PROCALCITON  --  0.22  --   --  0.29  --   WBC 13.4*  --  7.6  --   --  10.7*  LATICACIDVEN  --  1.2  --  1.1  --   --     This patient is critically ill with multiple organ system failure which requires frequent high complexity decision making, assessment, support, evaluation, and titration of therapies. This was completed through the application of advanced monitoring technologies  and extensive interpretation of multiple databases. During this encounter critical care time was devoted to patient care services described in this note for 38 minutes.   Julian Hy, DO 07/13/22 11:04 AM [facility]

## 2022-07-13 NOTE — Procedures (Signed)
Extubation Procedure Note  Patient Details:   Name: Strother Everitt DOB: 06/22/1946 MRN: [id number]   Airway Documentation:    Vent end date: 07/13/22 Vent end time: 1108   Evaluation  O2 sats: stable throughout Complications: No apparent complications Patient did tolerate procedure well. Bilateral Breath Sounds: Clear, Diminished   Yes  Extubated to BIPAP 10/5 40%.  Good cough, able to speak.  Vaughan Basta Cameron 07/13/2022, 12:23 PM

## 2022-07-13 NOTE — Progress Notes (Signed)
Now on [place] and is oxygenating well. Two assist to recliner. Patient is weak but tolerated well.

## 2022-07-13 NOTE — Progress Notes (Addendum)
[facility] for apixaban>> heparin Indication: atrial fibrillation  Allergies  Allergen Reactions   Ipratropium Other (See Comments)   Metoprolol     Was bringing blood pressure too low and he would pass out.   Morphine And Related Nausea Only   Vicodin [Hydrocodone-Acetaminophen] Other (See Comments)    Hallucinations    Penicillins Rash    Did it involve swelling of the face/tongue/throat, SOB, or low BP? No Did it involve sudden or severe rash/hives, skin peeling, or any reaction on the inside of your mouth or nose? Yes Did you need to seek medical attention at a hospital or doctor's office? No When did it last happen? Over 7 Years Ago      If all above answers are "NO", may proceed with cephalosporin use.      Patient Measurements: Height: 5\' 9"  (175.3 cm) Weight: 83 kg (182 lb 15.7 oz) IBW/kg (Calculated) : 70.7 Heparin Dosing Weight: 82 kg   Vital Signs: Temp: 98 F (36.7 C) (07/19 0800) Temp Source: Axillary (07/19 0800) BP: 156/76 (07/19 0800) Pulse Rate: 112 (07/19 0816)  Labs: Recent Labs    07/11/22 1352 07/11/22 1435 07/11/22 2259 07/12/22 0121 07/12/22 0618 07/12/22 1033 07/12/22 1621 07/12/22 2102 07/13/22 0613  HGB 10.8*   < >  --  9.8*  --  10.5*  --   --  9.7*  HCT 35.0*   < >  --  30.3*  --  31.0*  --   --  29.3*  PLT 352  --   --  222  --   --   --   --  247  APTT  --   --   --   --   --   --   --  92* 101*  LABPROT 20.2*  --   --   --   --   --   --   --   --   INR 1.7*  --   --   --   --   --   --   --   --   HEPARINUNFRC  --   --   --   --   --   --   --   --  >1.10*  CREATININE 3.00*  --   --  3.02*  --   --  3.31* 3.23* 3.54*  TROPONINIHS 21*  --  833* 990* 1,166*  --   --   --   --    < > = values in this interval not displayed.     Estimated Creatinine Clearance: 17.5 mL/min (A) (by C-G formula based on SCr of 3.54 mg/dL (H)).   Medical History: Past Medical History:  Diagnosis Date    Abrasion of forearm, right 07/27/2019   Acute respiratory failure (HCC)    AF (paroxysmal atrial fibrillation) (HCC)    AICD (automatic cardioverter/defibrillator) present    CAD (coronary artery disease)    Cardiac arrest ([place])    Cardiomyopathy, ischemic    Carotid artery occlusion    Chronic HFrEF (heart failure with reduced ejection fraction) (HCC)    Chronic respiratory failure (HCC)    CKD (chronic kidney disease), stage IV (HCC)    COPD (chronic obstructive pulmonary disease) ([place])    Cough 11/13/2019   Dementia without behavioral disturbance (HCC)    Dizziness 01/17/2016   Facial rash 08/15/2018   Frequent PVCs    GERD (gastroesophageal reflux disease)    Hyperlipidemia  Hypertension    ICD (implantable cardioverter-defibrillator) in place    Ischemic cardiomyopathy    Paroxysmal atrial flutter (HCC)    Pre-syncope 01/16/2018   Premature atrial contractions    PSVT (paroxysmal supraventricular tachycardia) (HCC)    Respiratory distress 12/27/2020   Sleep apnea    Stroke ([place])    TIA (transient ischemic attack)     Medications:  Scheduled:   allopurinol  100 mg Per Tube Daily   arformoterol  15 mcg Nebulization BID   aspirin  81 mg Per Tube Daily   atorvastatin  40 mg Per Tube Daily   budesonide (PULMICORT) nebulizer solution  0.5 mg Nebulization BID   carvedilol  25 mg Per Tube BID   Chlorhexidine Gluconate Cloth  6 each Topical Daily   doxazosin  1 mg Per Tube Daily   feeding supplement (PROSource TF)  45 mL Per Tube TID   insulin aspart  0-9 Units Subcutaneous Q4H   ipratropium-albuterol  3 mL Nebulization Q6H   loratadine  10 mg Per Tube Daily   mouth rinse  15 mL Mouth Rinse Q2H   pantoprazole sodium  40 mg Per Tube Daily   revefenacin  175 mcg Nebulization Daily    Assessment: 59 yom presenting with SOB - on apixaban PTA for hx Afib. Last dose on 7/17 at 2137.   Hgb 9.7, plt 247. Trop 95>0932. Heparin level undetectably high, aPTT on high end pf  goal at 101, on heparin 1150 units/hr. Bleeding at mouth stabilized/improved. No infusion issues.  Given recent DOAC, will monitor both aPTT and heparin level until correlate.   Goal of Therapy:  Heparin level 0.3-0.7 units/ml aPTT 66-102 seconds Monitor platelets by anticoagulation protocol: Yes   Plan:  Reduce heparin infusion to 1050 units/hr to keep in goal range  Monitor daily HL/aPTT until correlate, CBC, and for s/sx of bleeding  Antonietta Jewel, PharmD, [place] Pharmacist  Phone: [phone] 07/13/2022 9:06 AM  Please check AMION for all [place] phone numbers After 10:00 PM, call [place] [phone]

## 2022-07-14 DIAGNOSIS — N184 Chronic kidney disease, stage 4 (severe): Secondary | ICD-10-CM

## 2022-07-14 DIAGNOSIS — I5043 Acute on chronic combined systolic (congestive) and diastolic (congestive) heart failure: Secondary | ICD-10-CM | POA: Diagnosis not present

## 2022-07-14 DIAGNOSIS — N3001 Acute cystitis with hematuria: Secondary | ICD-10-CM

## 2022-07-14 DIAGNOSIS — N179 Acute kidney failure, unspecified: Secondary | ICD-10-CM

## 2022-07-14 DIAGNOSIS — J9601 Acute respiratory failure with hypoxia: Secondary | ICD-10-CM | POA: Diagnosis not present

## 2022-07-14 LAB — GLUCOSE, CAPILLARY
Glucose-Capillary: 106 mg/dL — ABNORMAL HIGH (ref 70–99)
Glucose-Capillary: 111 mg/dL — ABNORMAL HIGH (ref 70–99)
Glucose-Capillary: 113 mg/dL — ABNORMAL HIGH (ref 70–99)
Glucose-Capillary: 120 mg/dL — ABNORMAL HIGH (ref 70–99)
Glucose-Capillary: 126 mg/dL — ABNORMAL HIGH (ref 70–99)
Glucose-Capillary: 136 mg/dL — ABNORMAL HIGH (ref 70–99)

## 2022-07-14 LAB — IRON AND TIBC
Iron: 99 ug/dL (ref 45–182)
Saturation Ratios: 35 % (ref 17.9–39.5)
TIBC: 283 ug/dL (ref 250–450)
UIBC: 184 ug/dL

## 2022-07-14 LAB — CBC
HCT: 30.2 % — ABNORMAL LOW (ref 39.0–52.0)
Hemoglobin: 9.9 g/dL — ABNORMAL LOW (ref 13.0–17.0)
MCH: 31.3 pg (ref 26.0–34.0)
MCHC: 32.8 g/dL (ref 30.0–36.0)
MCV: 95.6 fL (ref 80.0–100.0)
Platelets: 262 10*3/uL (ref 150–400)
RBC: 3.16 MIL/uL — ABNORMAL LOW (ref 4.22–5.81)
RDW: 17.5 % — ABNORMAL HIGH (ref 11.5–15.5)
WBC: 12.1 10*3/uL — ABNORMAL HIGH (ref 4.0–10.5)
nRBC: 0 % (ref 0.0–0.2)

## 2022-07-14 LAB — BASIC METABOLIC PANEL
Anion gap: 17 — ABNORMAL HIGH (ref 5–15)
BUN: 65 mg/dL — ABNORMAL HIGH (ref 8–23)
CO2: 22 mmol/L (ref 22–32)
Calcium: 9.4 mg/dL (ref 8.9–10.3)
Chloride: 102 mmol/L (ref 98–111)
Creatinine, Ser: 3.75 mg/dL — ABNORMAL HIGH (ref 0.61–1.24)
GFR, Estimated: 16 mL/min — ABNORMAL LOW (ref >60)
Glucose, Bld: 115 mg/dL — ABNORMAL HIGH (ref 70–99)
Potassium: 4.2 mmol/L (ref 3.5–5.1)
Sodium: 141 mmol/L (ref 135–145)

## 2022-07-14 LAB — MAGNESIUM: Magnesium: 2.6 mg/dL — ABNORMAL HIGH (ref 1.7–2.4)

## 2022-07-14 LAB — APTT: aPTT: 71 seconds — ABNORMAL HIGH (ref 24–36)

## 2022-07-14 LAB — FERRITIN: Ferritin: 171 ng/mL (ref 24–336)

## 2022-07-14 LAB — PHOSPHORUS: Phosphorus: 4.9 mg/dL — ABNORMAL HIGH (ref 2.5–4.6)

## 2022-07-14 LAB — HEPARIN LEVEL (UNFRACTIONATED): Heparin Unfractionated: >1.1 IU/mL — ABNORMAL HIGH (ref 0.30–0.70)

## 2022-07-14 MED ORDER — SODIUM CHLORIDE 0.9 % IV SOLN
1.0000 g | INTRAVENOUS | Status: AC
  Administered 2022-07-14 – 2022-07-16 (×3): 1 g via INTRAVENOUS
  Filled 2022-07-14 (×3): qty 10

## 2022-07-14 MED ORDER — GUAIFENESIN 100 MG/5ML PO LIQD
5.0000 mL | ORAL | Status: DC | PRN
  Administered 2022-07-15 (×2): 5 mL via ORAL
  Filled 2022-07-14 (×2): qty 10

## 2022-07-14 NOTE — Plan of Care (Signed)
  Problem: Clinical Measurements: Goal: Respiratory complications will improve Outcome: Progressing   Problem: Activity: Goal: Risk for activity intolerance will decrease Outcome: Progressing   Problem: Nutrition: Goal: Adequate nutrition will be maintained Outcome: Progressing   Problem: Elimination: Goal: Will not experience complications related to urinary retention Outcome: Progressing   Problem: Pain Managment: Goal: General experience of comfort will improve Outcome: Progressing   Problem: Coping: Goal: Level of anxiety will decrease Outcome: Not Progressing

## 2022-07-14 NOTE — Progress Notes (Signed)
[facility] Progress Note    Assessment/ Plan:   AKI on CKD4: Likely secondary to diuresis. Follows w/ Dr. 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 Would recommend foley at this point instead of intermittent in and out cath -chk frequent bladder scan -did discuss briefly with wife and patient on 7/20, that if his kidney function does not respond to medical therapy then we may need to consider renal replacement therapy down the road especially given his pre-existing CKD 4. Hopefully, this will not be the case but will follow along -Continue to monitor daily Cr, Dose meds for GFR<15 -Maintain MAP>65 for optimal renal perfusion.  -Avoid nephrotoxic medications including NSAIDs and iodinated intravenous contrast exposure unless the latter is absolutely indicated.  Preferred narcotic agents for pain control are hydromorphone, fentanyl, and methadone. Morphine should not be used. Avoid Baclofen and avoid oral sodium phosphate and magnesium citrate based laxatives / bowel preps. Continue strict Input and Output monitoring. Will monitor the patient closely with you and intervene or adjust therapy as indicated by changes in clinical status/labs    AHRF secondary to acute on chronic COPD and acute on chronic HFrEF -s/p diuresis which is currently on hold -cardio following. COPD management per primary service -extubated 7/19   Hypertension: -BP acceptable   Anemia due to chronic disease: -Transfuse for Hgb<7 g/dL. Iron replete on panel. If hgb fails to further improve, will likely start ESA, hgb up to 9.9 today   PAF -on amio and hep gtt    Subjective:   Pleasantly confused. Wife at bedside.  In and out cath yesterday for urine retention ~600cc.   Objective:   BP (!) 151/71   Pulse (!) 113   Temp 98.3 F (36.8 C)  (Oral)   Resp 16   Ht 5\' 9"  (1.753 m)   Wt 82.6 kg   SpO2 98%   BMI 26.89 kg/m   Intake/Output Summary (Last 24 hours) at 07/14/2022 0851 Last data filed at 07/14/2022 0600 Gross per 24 hour  Intake 1698.76 ml  Output 975 ml  Net 723.76 ml   Weight change: -0.4 kg  Physical Exam: Gen:NAD, sitting up in chair CVS: RRR Resp:normal wob, no w/r/r/c QIO:NGEX, slightly distended, NT Ext:no sig edema Neuro: awake, following commands  Imaging: US RENAL  Result Date: 07/13/2022 CLINICAL DATA:  Acute kidney injury. EXAM: RENAL / URINARY TRACT ULTRASOUND COMPLETE COMPARISON:  None Available. FINDINGS: Right Kidney: Renal measurements: 8.9 x 4.9 x 6.1 cm = volume: 138.4 mL. Increased parenchymal echogenicity. No mass or hydronephrosis visualized. Left Kidney: Renal measurements: 9.3 by 4.5 x 5.1 cm = volume: 112.9 mL. Increased parenchymal echogenicity. No mass or hydronephrosis visualized. Bladder: No focal bladder abnormality. Echogenic debris noted within the dependent portion of the bladder. Other: Diminished exam detail due to large body habitus and overlying bowel gas as well as portable technique. IMPRESSION: 1. No hydronephrosis. 2. Bilateral echogenic kidneys compatible with chronic medical renal disease. 3. Echogenic debris noted within the dependent portion of the urinary bladder, which may be seen in the setting of urinary tract infection. Electronically Signed   By: Kerby Moors M.D.   On: 07/13/2022 16:11   DG Chest Port 1 View  Result Date: 07/13/2022 CLINICAL DATA:  Respiratory failure. EXAM: PORTABLE CHEST 1 VIEW COMPARISON:  July 11, 2022. FINDINGS: Stable cardiomediastinal silhouette. Sternotomy wires are  noted. Endotracheal and nasogastric tubes are unchanged in position. Left-sided pacemaker is unchanged. Slightly improved bilateral lung opacities are noted suggesting improving pulmonary edema or multifocal pneumonia. Bony thorax is unremarkable. IMPRESSION: Stable support  apparatus. Improved bilateral lung opacities as described above. Electronically Signed   By: Marijo Conception M.D.   On: 07/13/2022 08:44   ECHOCARDIOGRAM COMPLETE  Result Date: 07/12/2022    ECHOCARDIOGRAM REPORT   Patient Name:   Joe Wiggins Date of Exam: 07/12/2022 Medical Rec #:  [id number]  Height:       69.0 in Accession #:    [id number] Weight:       180.8 lb Date of Birth:  1946/11/06  BSA:          1.979 m Patient Age:    76 years   BP:           132/75 mmHg Patient Gender: M          HR:           111 bpm. Exam Location:  Inpatient Procedure: 2D Echo, Cardiac Doppler and Color Doppler Indications:     Ischemic cardiomyopathy  History:         Patient has prior history of Echocardiogram examinations, most                  recent 03/08/2022. CHF, CAD, COPD, Arrythmias:Atrial                  Fibrillation; Risk Factors:Hypertension.  Sonographer:     Jefferey Pica Referring Phys:  [id number] Frederik Pear Diagnosing Phys: Eleonore Chiquito MD IMPRESSIONS  1. Left ventricular ejection fraction, by estimation, is 40 to 45%. The left ventricle has mildly decreased function. The left ventricle demonstrates regional wall motion abnormalities (see scoring diagram/findings for description). Left ventricular diastolic function could not be evaluated.  2. Right ventricular systolic function is normal. The right ventricular size is normal. There is mildly elevated pulmonary artery systolic pressure. The estimated right ventricular systolic pressure is 74.2 mmHg.  3. The mitral valve is grossly normal. Trivial mitral valve regurgitation. No evidence of mitral stenosis.  4. The aortic valve is tricuspid. Aortic valve regurgitation is not visualized. No aortic stenosis is present.  5. The inferior vena cava is normal in size with <50% respiratory variability, suggesting right atrial pressure of 8 mmHg. Comparison(s): No significant change from prior study. FINDINGS  Left Ventricle: Left ventricular ejection fraction, by  estimation, is 40 to 45%. The left ventricle has mildly decreased function. The left ventricle demonstrates regional wall motion abnormalities. The left ventricular internal cavity size was normal in size. There is no left ventricular hypertrophy. Left ventricular diastolic function could not be evaluated due to atrial fibrillation. Left ventricular diastolic function could not be evaluated.  LV Wall Scoring: The antero-lateral wall and posterior wall are hypokinetic. Right Ventricle: The right ventricular size is normal. No increase in right ventricular wall thickness. Right ventricular systolic function is normal. There is mildly elevated pulmonary artery systolic pressure. The tricuspid regurgitant velocity is 2.90  m/s, and with an assumed right atrial pressure of 8 mmHg, the estimated right ventricular systolic pressure is 59.5 mmHg. Left Atrium: Left atrial size was normal in size. Right Atrium: Right atrial size was normal in size. Pericardium: There is no evidence of pericardial effusion. Mitral Valve: The mitral valve is grossly normal. Trivial mitral valve regurgitation. No evidence of mitral valve stenosis. Tricuspid Valve: The tricuspid valve is grossly normal. Tricuspid  valve regurgitation is mild . No evidence of tricuspid stenosis. Aortic Valve: The aortic valve is tricuspid. Aortic valve regurgitation is not visualized. No aortic stenosis is present. Aortic valve peak gradient measures 3.2 mmHg. Pulmonic Valve: The pulmonic valve was grossly normal. Pulmonic valve regurgitation is not visualized. No evidence of pulmonic stenosis. Aorta: The aortic root is normal in size and structure. Venous: The inferior vena cava is normal in size with less than 50% respiratory variability, suggesting right atrial pressure of 8 mmHg. IAS/Shunts: The atrial septum is grossly normal. Additional Comments: A device lead is visualized in the right atrium and right ventricle.  LEFT VENTRICLE PLAX 2D LVIDd:         5.30  cm LVIDs:         4.60 cm LV PW:         1.20 cm LV IVS:        1.10 cm LVOT diam:     2.00 cm LV SV:         54 LV SV Index:   27 LVOT Area:     3.14 cm  RIGHT VENTRICLE             IVC RV Basal diam:  3.50 cm     IVC diam: 1.90 cm RV Mid diam:    2.50 cm RV S prime:     11.50 cm/s TAPSE (M-mode): 1.6 cm LEFT ATRIUM             Index        RIGHT ATRIUM           Index LA diam:        3.70 cm 1.87 cm/m   RA Area:     20.60 cm LA Vol (A2C):   51.8 ml 26.17 ml/m  RA Volume:   61.80 ml  31.23 ml/m LA Vol (A4C):   56.6 ml 28.60 ml/m LA Biplane Vol: 53.9 ml 27.23 ml/m  AORTIC VALVE                 PULMONIC VALVE AV Area (Vmax): 2.91 cm     PV Vmax:       0.75 m/s AV Vmax:        89.95 cm/s   PV Peak grad:  2.3 mmHg AV Peak Grad:   3.2 mmHg LVOT Vmax:      83.20 cm/s LVOT Vmean:     52.100 cm/s LVOT VTI:       0.172 m  AORTA Ao Root diam: 3.70 cm TRICUSPID VALVE TR Peak grad:   33.6 mmHg TR Vmax:        290.00 cm/s  SHUNTS Systemic VTI:  0.17 m Systemic Diam: 2.00 cm Eleonore Chiquito MD Electronically signed by Eleonore Chiquito MD Signature Date/Time: 07/12/2022/3:52:34 PM    Final (Updated)     Labs: BMET Recent Labs  Lab 07/11/22 1352 07/11/22 1435 07/12/22 0121 07/12/22 1033 07/12/22 1326 07/12/22 1621 07/12/22 2102 07/13/22 0613 07/14/22 0603  NA 137 138 139 139  --  141 140 140 141  K 4.3 4.0 3.5 3.9  --  3.8 3.9 4.0 4.2  CL 101  --  104  --   --  106 104 106 102  CO2 22  --  21*  --   --  21* 20* 22 22  GLUCOSE 146*  --  155*  --   --  154* 160* 152* 115*  BUN 28*  --  29*  --   --  38* 38* 47* 65*  CREATININE 3.00*  --  3.02*  --   --  3.31* 3.23* 3.54* 3.75*  CALCIUM 9.7  --  9.5  --   --  9.7 9.9 9.4 9.4  PHOS  --   --   --   --  2.2* 2.7  --  3.7 4.9*   CBC Recent Labs  Lab 07/11/22 1352 07/11/22 1435 07/12/22 0121 07/12/22 1033 07/13/22 0613 07/14/22 0603  WBC 13.4*  --  7.6  --  10.7* 12.1*  NEUTROABS 9.8*  --   --   --   --   --   HGB 10.8*   < > 9.8* 10.5* 9.7* 9.9*   HCT 35.0*   < > 30.3* 31.0* 29.3* 30.2*  MCV 102.0*  --  94.7  --  93.6 95.6  PLT 352  --  222  --  247 262   < > = values in this interval not displayed.    Medications:     (feeding supplement) PROSource Plus  30 mL Oral TID   allopurinol  100 mg Oral Daily   arformoterol  15 mcg Nebulization BID   aspirin  81 mg Oral Daily   atorvastatin  40 mg Oral Daily   budesonide (PULMICORT) nebulizer solution  0.5 mg Nebulization BID   carvedilol  25 mg Oral BID   Chlorhexidine Gluconate Cloth  6 each Topical Daily   clopidogrel  75 mg Oral Daily   doxazosin  1 mg Oral Daily   doxycycline  100 mg Oral Q12H   insulin aspart  0-9 Units Subcutaneous Q4H   loratadine  10 mg Oral Daily   revefenacin  175 mcg Nebulization Daily      Gean Quint, MD [facility] 07/14/2022, 8:51 AM

## 2022-07-14 NOTE — Progress Notes (Signed)
Progress Note  [name] Date of Encounter: 07/14/2022  [facility] Cardiologist: Donato Heinz, MD   Subjective   Confusion overnight. Wife at bedside this AM. No pain or current concerns. Sitting comfortably in bedside chair, pleasant and conversant.  Inpatient Medications    Scheduled Meds:  (feeding supplement) PROSource Plus  30 mL Oral TID   allopurinol  100 mg Oral Daily   arformoterol  15 mcg Nebulization BID   aspirin  81 mg Oral Daily   atorvastatin  40 mg Oral Daily   budesonide (PULMICORT) nebulizer solution  0.5 mg Nebulization BID   carvedilol  25 mg Oral BID   Chlorhexidine Gluconate Cloth  6 each Topical Daily   clopidogrel  75 mg Oral Daily   doxazosin  1 mg Oral Daily   doxycycline  100 mg Oral Q12H   insulin aspart  0-9 Units Subcutaneous Q4H   loratadine  10 mg Oral Daily   revefenacin  175 mcg Nebulization Daily   Continuous Infusions:  amiodarone 30 mg/hr (07/14/22 0600)   heparin 1,050 Units/hr (07/14/22 0646)   PRN Meds: acetaminophen, albuterol, docusate sodium, fentaNYL, hydrALAZINE, ondansetron (ZOFRAN) IV, mouth rinse, polyethylene glycol   Vital Signs    Vitals:   07/14/22 0600 07/14/22 0615 07/14/22 0631 07/14/22 0806  BP: (!) 146/67  (!) 151/71   Pulse: 71  (!) 113   Resp: 14  16   Temp:  98.3 F (36.8 C)    TempSrc:  Oral    SpO2: 97%  95% 98%  Weight:      Height:        Intake/Output Summary (Last 24 hours) at 07/14/2022 0846 Last data filed at 07/14/2022 0600 Gross per 24 hour  Intake 1698.76 ml  Output 975 ml  Net 723.76 ml      07/14/2022    5:00 AM 07/13/2022    5:00 AM 07/12/2022    5:00 AM  Last 3 Weights  Weight (lbs) 182 lb 1.6 oz 182 lb 15.7 oz 180 lb 12.4 oz  Weight (kg) 82.6 kg 83 kg 82 kg      Telemetry    Atrial tachycardia episodes rates into 110-120, asymptomatic- Personally Reviewed  ECG    SR first deg AVB, LVH, PVC - Personally Reviewed  Physical Exam   GEN: No  acute distress.   Neck: No JVD Cardiac: RRR, soft systolic murmur.  Respiratory: clear GI: Soft, nontender, non-distended  MS: trace edema; No deformity. Heels and sacrum dressed. Warm extremities Neuro:  Nonfocal  Psych: Normal affect   Labs    High Sensitivity Troponin:   Recent Labs  Lab 07/11/22 1352 07/11/22 2259 07/12/22 0121 07/12/22 0618  TROPONINIHS 21* 833* 990* 1,166*     Chemistry Recent Labs  Lab 07/11/22 1352 07/11/22 1435 07/12/22 1621 07/12/22 2102 07/13/22 0613 07/14/22 0603  NA 137   < > 141 140 140 141  K 4.3   < > 3.8 3.9 4.0 4.2  CL 101   < > 106 104 106 102  CO2 22   < > 21* 20* 22 22  GLUCOSE 146*   < > 154* 160* 152* 115*  BUN 28*   < > 38* 38* 47* 65*  CREATININE 3.00*   < > 3.31* 3.23* 3.54* 3.75*  CALCIUM 9.7   < > 9.7 9.9 9.4 9.4  MG 2.3   < > 2.4  --  2.4 2.6*  PROT 7.3  --   --   --  6.3*  --   ALBUMIN 4.0  --   --   --  3.4*  --   AST 27  --   --   --  36  --   ALT 20  --   --   --  16  --   ALKPHOS 150*  --   --   --  104  --   BILITOT 0.8  --   --   --  0.6  --   GFRNONAA 21*   < > 18* 19* 17* 16*  ANIONGAP 14   < > 14 16* 12 17*   < > = values in this interval not displayed.    Lipids  Recent Labs  Lab 07/12/22 0121  TRIG 191*    Hematology Recent Labs  Lab 07/12/22 0121 07/12/22 1033 07/13/22 0613 07/14/22 0603  WBC 7.6  --  10.7* 12.1*  RBC 3.20*  --  3.13* 3.16*  HGB 9.8* 10.5* 9.7* 9.9*  HCT 30.3* 31.0* 29.3* 30.2*  MCV 94.7  --  93.6 95.6  MCH 30.6  --  31.0 31.3  MCHC 32.3  --  33.1 32.8  RDW 16.5*  --  17.2* 17.5*  PLT 222  --  247 262   Thyroid  Recent Labs  Lab 07/12/22 1621  TSH 1.029    BNP Recent Labs  Lab 07/11/22 1352 07/13/22 0613  BNP 777.0* 315.4*    DDimer No results for input(s): "DDIMER" in the last 168 hours.   Radiology    US RENAL  Result Date: 07/13/2022 CLINICAL DATA:  Acute kidney injury. EXAM: RENAL / URINARY TRACT ULTRASOUND COMPLETE COMPARISON:  None Available.  FINDINGS: Right Kidney: Renal measurements: 8.9 x 4.9 x 6.1 cm = volume: 138.4 mL. Increased parenchymal echogenicity. No mass or hydronephrosis visualized. Left Kidney: Renal measurements: 9.3 by 4.5 x 5.1 cm = volume: 112.9 mL. Increased parenchymal echogenicity. No mass or hydronephrosis visualized. Bladder: No focal bladder abnormality. Echogenic debris noted within the dependent portion of the bladder. Other: Diminished exam detail due to large body habitus and overlying bowel gas as well as portable technique. IMPRESSION: 1. No hydronephrosis. 2. Bilateral echogenic kidneys compatible with chronic medical renal disease. 3. Echogenic debris noted within the dependent portion of the urinary bladder, which may be seen in the setting of urinary tract infection. Electronically Signed   By: Kerby Moors M.D.   On: 07/13/2022 16:11   DG Chest Port 1 View  Result Date: 07/13/2022 CLINICAL DATA:  Respiratory failure. EXAM: PORTABLE CHEST 1 VIEW COMPARISON:  July 11, 2022. FINDINGS: Stable cardiomediastinal silhouette. Sternotomy wires are noted. Endotracheal and nasogastric tubes are unchanged in position. Left-sided pacemaker is unchanged. Slightly improved bilateral lung opacities are noted suggesting improving pulmonary edema or multifocal pneumonia. Bony thorax is unremarkable. IMPRESSION: Stable support apparatus. Improved bilateral lung opacities as described above. Electronically Signed   By: Marijo Conception M.D.   On: 07/13/2022 08:44   ECHOCARDIOGRAM COMPLETE  Result Date: 07/12/2022    ECHOCARDIOGRAM REPORT   Patient Name:   Joe Wiggins Date of Exam: 07/12/2022 Medical Rec #:  [id number]  Height:       69.0 in Accession #:    [id number] Weight:       180.8 lb Date of Birth:  1946-01-11  BSA:          1.979 m Patient Age:    76 years   BP:  132/75 mmHg Patient Gender: M          HR:           111 bpm. Exam Location:  Inpatient Procedure: 2D Echo, Cardiac Doppler and Color Doppler Indications:      Ischemic cardiomyopathy  History:         Patient has prior history of Echocardiogram examinations, most                  recent 03/08/2022. CHF, CAD, COPD, Arrythmias:Atrial                  Fibrillation; Risk Factors:Hypertension.  Sonographer:     Jefferey Pica Referring Phys:  [id number] Frederik Pear Diagnosing Phys: Eleonore Chiquito MD IMPRESSIONS  1. Left ventricular ejection fraction, by estimation, is 40 to 45%. The left ventricle has mildly decreased function. The left ventricle demonstrates regional wall motion abnormalities (see scoring diagram/findings for description). Left ventricular diastolic function could not be evaluated.  2. Right ventricular systolic function is normal. The right ventricular size is normal. There is mildly elevated pulmonary artery systolic pressure. The estimated right ventricular systolic pressure is 27.7 mmHg.  3. The mitral valve is grossly normal. Trivial mitral valve regurgitation. No evidence of mitral stenosis.  4. The aortic valve is tricuspid. Aortic valve regurgitation is not visualized. No aortic stenosis is present.  5. The inferior vena cava is normal in size with <50% respiratory variability, suggesting right atrial pressure of 8 mmHg. Comparison(s): No significant change from prior study. FINDINGS  Left Ventricle: Left ventricular ejection fraction, by estimation, is 40 to 45%. The left ventricle has mildly decreased function. The left ventricle demonstrates regional wall motion abnormalities. The left ventricular internal cavity size was normal in size. There is no left ventricular hypertrophy. Left ventricular diastolic function could not be evaluated due to atrial fibrillation. Left ventricular diastolic function could not be evaluated.  LV Wall Scoring: The antero-lateral wall and posterior wall are hypokinetic. Right Ventricle: The right ventricular size is normal. No increase in right ventricular wall thickness. Right ventricular systolic function is  normal. There is mildly elevated pulmonary artery systolic pressure. The tricuspid regurgitant velocity is 2.90  m/s, and with an assumed right atrial pressure of 8 mmHg, the estimated right ventricular systolic pressure is 82.4 mmHg. Left Atrium: Left atrial size was normal in size. Right Atrium: Right atrial size was normal in size. Pericardium: There is no evidence of pericardial effusion. Mitral Valve: The mitral valve is grossly normal. Trivial mitral valve regurgitation. No evidence of mitral valve stenosis. Tricuspid Valve: The tricuspid valve is grossly normal. Tricuspid valve regurgitation is mild . No evidence of tricuspid stenosis. Aortic Valve: The aortic valve is tricuspid. Aortic valve regurgitation is not visualized. No aortic stenosis is present. Aortic valve peak gradient measures 3.2 mmHg. Pulmonic Valve: The pulmonic valve was grossly normal. Pulmonic valve regurgitation is not visualized. No evidence of pulmonic stenosis. Aorta: The aortic root is normal in size and structure. Venous: The inferior vena cava is normal in size with less than 50% respiratory variability, suggesting right atrial pressure of 8 mmHg. IAS/Shunts: The atrial septum is grossly normal. Additional Comments: A device lead is visualized in the right atrium and right ventricle.  LEFT VENTRICLE PLAX 2D LVIDd:         5.30 cm LVIDs:         4.60 cm LV PW:         1.20 cm LV IVS:  1.10 cm LVOT diam:     2.00 cm LV SV:         54 LV SV Index:   27 LVOT Area:     3.14 cm  RIGHT VENTRICLE             IVC RV Basal diam:  3.50 cm     IVC diam: 1.90 cm RV Mid diam:    2.50 cm RV S prime:     11.50 cm/s TAPSE (M-mode): 1.6 cm LEFT ATRIUM             Index        RIGHT ATRIUM           Index LA diam:        3.70 cm 1.87 cm/m   RA Area:     20.60 cm LA Vol (A2C):   51.8 ml 26.17 ml/m  RA Volume:   61.80 ml  31.23 ml/m LA Vol (A4C):   56.6 ml 28.60 ml/m LA Biplane Vol: 53.9 ml 27.23 ml/m  AORTIC VALVE                 PULMONIC  VALVE AV Area (Vmax): 2.91 cm     PV Vmax:       0.75 m/s AV Vmax:        89.95 cm/s   PV Peak grad:  2.3 mmHg AV Peak Grad:   3.2 mmHg LVOT Vmax:      83.20 cm/s LVOT Vmean:     52.100 cm/s LVOT VTI:       0.172 m  AORTA Ao Root diam: 3.70 cm TRICUSPID VALVE TR Peak grad:   33.6 mmHg TR Vmax:        290.00 cm/s  SHUNTS Systemic VTI:  0.17 m Systemic Diam: 2.00 cm Eleonore Chiquito MD Electronically signed by Eleonore Chiquito MD Signature Date/Time: 07/12/2022/3:52:34 PM    Final (Updated)     Cardiac Studies   Echo as above  Patient Profile     77 y.o. 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  Assessment & Plan    Principal Problem:   Acute exacerbation of CHF (congestive heart failure) (HCC) Active Problems:   Malnutrition of moderate degree  1. Acute on chronic respiratory failure, with suspected AECOPD and acute on chronic HFrEF - unclear precipitant, has baseline severe comorbidities including HFrEF, CKD, and COPD - Echo unchanged from prior, EF mildly reduced. No new regionals compared to prior, in setting of NSTEMI.  - unclear if this is related to ischemia so will treat NSTEMI medically. Wife notes he does not feel well at home and is slowing down, however this is likely multifactorial. - hx of HTN, may need renal ultrasound to exclude RAS with possibility of acute pulmonary edema with episodes of HTN. Wife reports he has well controlled BP at home. - nephrology suspects diuresis related AKI and urinary retention.  - continue to hold diuresis, defer to expertise of nephrology regarding renal function. Appears warm and dry on exam.   2.  Paroxysmal atrial fib/flutter/SVT, currently with narrow complex tachycardia - on IV amiodarone - on heparin in lieu of Eliquis  - carvedilol  25 mg BID, EP on board - can consider IV metoprolol if symptomatic episodes of AT however pt notes no symptoms and is normotensive during these episodes. PT agreeable to IV metop prn if needed.   3. AKI on CKD stage IV - baseline CR around 2.4-2.8 recently, nephrology on board, see #1 - not a candidate for ACEI, ARB, ARNI, spirolactone due to this    4. CAD s/p remote CABG 2014, NSTEMI 02/2022 treated medically, HLD, here with recurrent NSTEMI - unknown if this is a primary ACS with de novo CAD versus demand ischemia from multiple physiologic stressors - given continued renal insufficiency and poor baseline functional status recently, anticipate medical therapy - on heparin, ASA, beta blocker, statin - plavix added. Red colored urine x 1, will monitor.   5. Essential HTN - managed in context above   6. Carotid artery disease, potential L subclavian stenosis - occluded RICA, 94-85% LICA with prior stent placement, stenotic L subclavian by duplex 10/2021  7. ICD in place      For questions or updates, please contact [facility] Please consult [email or web address] for contact info under        Signed, Elouise Munroe, MD  07/14/2022, 8:46 AM

## 2022-07-14 NOTE — Evaluation (Signed)
Physical Therapy Evaluation Patient Details Name: Joe Wiggins MRN: [id number] DOB: 19-Jan-1946 Today's Date: 07/14/2022  History of Present Illness  76 yo male admitted 7/17 with acute CHf exacerbation with respiratory failure. Intubated 7/17-7/19. PMHx: COPD, HLD, CAD, ICD, NSTEMI, CABG, vascular dementia, HFrEF, HTN, anemia, gout, OSA with CPAP use, and CKD  Clinical Impression  Pt pleasant and able to walk in hall with need for RW this session with wife present to confirm home setup and PLOF. Pt remains supplemental O2 dependent with mobility and demonstrates decreased balance, strength and function to decrease burden of care. Encouraged daily mobility with nursing staff.   HR max 130 SpO2 93% on 2L     Recommendations for follow up therapy are one component of a multi-disciplinary discharge planning process, led by the attending physician.  Recommendations may be updated based on patient status, additional functional criteria and insurance authorization.  Follow Up Recommendations Home health PT      Assistance Recommended at Discharge Frequent or constant Supervision/Assistance  Patient can return home with the following  A little help with walking and/or transfers;A little help with bathing/dressing/bathroom;Assistance with cooking/housework;Direct supervision/assist for financial management;Help with stairs or ramp for entrance;Direct supervision/assist for medications management;Assist for transportation    Equipment Recommendations None recommended by PT  Recommendations for Other Services       Functional Status Assessment Patient has had a recent decline in their functional status and demonstrates the ability to make significant improvements in function in a reasonable and predictable amount of time.     Precautions / Restrictions Precautions Precautions: Fall;Other (comment) Precaution Comments: watch sats Restrictions Weight Bearing Restrictions: No      Mobility   Bed Mobility Overal bed mobility: Needs Assistance Bed Mobility: Sit to Supine       Sit to supine: Min guard   General bed mobility comments: cues for sequence, guarding for lines, increased time to position in midline    Transfers Overall transfer level: Needs assistance   Transfers: Sit to/from Stand Sit to Stand: Min guard           General transfer comment: cues for hand placement    Ambulation/Gait Ambulation/Gait assistance: Min guard Gait Distance (Feet): 200 Feet Assistive device: Rolling walker (2 wheels) Gait Pattern/deviations: Step-through pattern, Decreased stride length   Gait velocity interpretation: 1.31 - 2.62 ft/sec, indicative of limited community ambulator   General Gait Details: cues for proximity to RW and direction. pt with desaturation to 86% on RA with return to 2L for remainder of gait at 93% without consistent pleth  Stairs            Wheelchair Mobility    Modified Rankin (Stroke Patients Only)       Balance Overall balance assessment: Needs assistance   Sitting balance-[name]: Good Sitting balance - Comments: static sitting without support, left lean with perturbation     Standing balance-[name]: Fair Standing balance comment: static standing briefly without UE support, RW for gait                             Pertinent Vitals/Pain Pain Assessment Pain Assessment: No/denies pain    Home Living Family/patient expects to be discharged to:: Private residence Living Arrangements: Spouse/significant other Available Help at Discharge: Family;Available 24 hours/day Type of Home: House Home Access: Stairs to enter Entrance Stairs-Rails: Right Entrance Stairs-Number of Steps: 1   Home Layout: One level Home Equipment: Conservation officer, nature (  2 wheels);Cane - single point;BSC/3in1;Shower seat;Wheelchair - Hydrographic surveyor Prior Level of Function : Needs assist              Mobility Comments: walks without AD, mostly sedentary, watches TV ADLs Comments: wife assists with bathing and dressing, wife does the IADLs     Hand Dominance        Extremity/Trunk Assessment   Upper Extremity Assessment Upper Extremity Assessment: Overall WFL for tasks assessed    Lower Extremity Assessment Lower Extremity Assessment: Generalized weakness    Cervical / Trunk Assessment Cervical / Trunk Assessment: Kyphotic  Communication   Communication: HOH  Cognition Arousal/Alertness: Awake/alert Behavior During Therapy: WFL for tasks assessed/performed Overall Cognitive Status: History of cognitive impairments - at baseline                                          General Comments      Exercises General Exercises - Lower Extremity Long Arc Quad: AROM, Both, 10 reps, Seated Hip Flexion/Marching: AROM, Both, 10 reps, Seated   Assessment/Plan    PT Assessment Patient needs continued PT services  PT Problem List Decreased strength;Decreased mobility;Decreased safety awareness;Decreased activity tolerance;Decreased balance;Decreased knowledge of use of DME       PT Treatment Interventions Gait training;DME instruction;Therapeutic exercise;Balance training;Functional mobility training;Therapeutic activities;Patient/family education;Stair training    PT Goals (Current goals can be found in the Care Plan section)  Acute Rehab PT Goals Patient Stated Goal: return home PT Goal Formulation: With patient/family Time For Goal Achievement: 07/28/22 Potential to Achieve Goals: Fair    Frequency Min 3X/week     Co-evaluation               AM-PAC PT "6 Clicks" Mobility  Outcome Measure Help needed turning from your back to your side while in a flat bed without using bedrails?: A Little Help needed moving from lying on your back to sitting on the side of a flat bed without using bedrails?: A Little Help needed moving to and from a bed to a  chair (including a wheelchair)?: A Little Help needed standing up from a chair using your arms (e.g., wheelchair or bedside chair)?: A Little Help needed to walk in hospital room?: A Little Help needed climbing 3-5 steps with a railing? : A Little 6 Click Score: 18    End of Session Equipment Utilized During Treatment: Oxygen Activity Tolerance: Patient tolerated treatment well Patient left: in bed;with bed alarm set;with family/visitor present Nurse Communication: Mobility status PT Visit Diagnosis: Other abnormalities of gait and mobility (R26.89);Difficulty in walking, not elsewhere classified (R26.2);Muscle weakness (generalized) (M62.81)    Time: 9381-0175 PT Time Calculation (min) (ACUTE ONLY): 25 min   Charges:   PT Evaluation $PT Eval Moderate Complexity: 1 Mod PT Treatments $Gait Training: 8-22 mins        Bayard Males, PT Acute Rehabilitation Services Office: [place] 07/14/2022, 10:40 AM

## 2022-07-14 NOTE — Progress Notes (Addendum)
NAME:  Joe Wiggins, MRN:  [id number], DOB:  1946/10/07, LOS: 3 ADMISSION DATE:  07/11/2022, CONSULTATION DATE:  07/11/22 REFERRING MD:  EDP CHIEF COMPLAINT:  Acute Hypoxic Respiratory Failure   History of Present Illness:  Patient with PMHx noted below presenting to ED with worsening shortness of breath. His wife states this has been an issue for the patient for 3-4 weeks but it was exertional and this morning patient had worsening dyspnea that did not resolve. Wife states patient did not have any fevers, cough, n/v/d. He has been complaining of worsening dyspnea and orthopnea. She endorsed he is adherent to his medications.   His dyspnea worsened acutely and EMS was called.  They used nonrebreather and CPAP but the patient did not tolerate.  Patient was receiving bag-valve-mask on ED arrival and was intubated on arrival. Showed leukocytosis at 13.4k.  CMP showed creatinine at 3.0 from baseline of 2.4-2.5.  Troponins were mildly elevated.  BNP elevated at 777.  ABG obtained showed 7.3/63.4/227.  Blood cultures were obtained and patient started on empiric community pneumonia treatment.  That showed concern for pulmonary edema versus multifocal pneumonia. Pro-cal, and lactic acid are pending.  Patient was admitted to the ICU.  Pertinent Medical History:  Hx of chronic respiratory failure on home O2, PAF on AC, CAD, ischemic cardiomyopathy, ICD in place, CKD stage IV, COPD, dementia, GERD, hypertension, hyperlipidemia, history of CVA.  He has a history of multivessel disease status post CABG x4 in 2014 at [facility] Events: Including procedures, antibiotic start and stop dates in addition to other pertinent events   07/17-Presented to the ED and intubated. Admitted to ICU. 7/19 Weaning on vent, PSV 5/5 FiO2 40%. Following commands. Remains tachy to 110s. Extubated. 7/20 Stable on 3L [place]. Tachy to 110s intermittently. Tolerating diet, working with PT. Urine dark and Cr rising, Nephro  consulted.  Interim History / Subjective:  No significant events overnight Mild confusion/possibly sundowning, has occurred during prior hospitalizations Seroquel/Haldol avoided in the setting of long QT Still with urinary retention requiring I&O cath Creatinine continues to rise, Nephro consulted Extubated yesterday to BiPAP Remains stable on 3 L nasal cannula ADAT, worked with PT today and walked in the hall Stable for transfer out of ICU today  Objective:  Blood pressure (!) 151/71, pulse (!) 113, temperature 98.3 F (36.8 C), temperature source Oral, resp. rate 16, height 5\' 9"  (1.753 m), weight 82.6 kg, SpO2 98 %.    Vent Mode: BIPAP FiO2 (%):  [40 %] 40 % PEEP:  [5 cmH20] 5 cmH20 Pressure Support:  [10 cmH20] 10 cmH20   Intake/Output Summary (Last 24 hours) at 07/14/2022 0835 Last data filed at 07/14/2022 0600 Gross per 24 hour  Intake 1698.76 ml  Output 975 ml  Net 723.76 ml    Filed Weights   07/12/22 0500 07/13/22 0500 07/14/22 0500  Weight: 82 kg 83 kg 82.6 kg   Physical Examination: General: Chronically ill-appearing elderly man in NAD. Pleasant and conversant, up to chair on rounds. HEENT: [place]/AT, anicteric sclera, PERRL, moist mucous membranes. Edentulous. Neuro: Awake, oriented x 4. Responds to verbal stimuli. Following commands consistently. Moves all 4 extremities spontaneously. CV: Tachycardic, regular rhythm, no m/g/r. PULM: Breathing even and unlabored on 3LNC. Lung fields CTAB, diminished at bases. GI: Soft, nontender, nondistended. Normoactive bowel sounds. Extremities: No significant LE edema noted. Skin: Warm/dry, no rashes.  Resolved Hospital Problem List:   Assessment & Plan:  Acute on chronic hypoxic respiratory failure 2/2 HF  exacerbation Hx of HFrEF  Hx of COPD Presented with dyspnea and oxygen sats in 80s and HR in 130s.  Likely appears secondary to pulmonary edema due to his heart failure.  Patient's Echo in 02/2022 shows G1DD, EF 40 %.  There may be a component of flash pulmonary edema given his hypertensive blood pressures on presentation.  But there is a chronic worsening of his heart failure as he has been having worsening dyspnea and orthopnea for the past couple weeks.  Currently patient is getting antibiotics for pneumonia coverage, but given no infectious symptoms low threshold to discontinue them.  Procal pending and will use it to guide antibiotic use. - Extubated 7/19 to BiPAP without issue - Continue supplemental O2 support - Wean O2 for sat > 90% - Bronchodilators (Albuterol, Pulmicort, DuoNeb, Brovana/Yupelri) - Pulmonary hygiene - Continue doxycycline for CAP coverage, ceftriaxone resumed for CAP/?urinary coverage  Tachycardia PAF HTN - Amiodarone, Coreg per Cards - Heparin gtt for now, transition to [place] as appropriate - Continue hydralazine PRN - Cardiac monitoring  AKI on CKD IV Urinary retention ?UTI Chronic. Baseline Cr 2.4-2.8. - Trend BMP - Replete electrolytes as indicated - Monitor I&Os - Avoid nephrotoxic agents as able - Ensure adequate renal perfusion - Nephro consulted, feel this is likely a component of urinary retention; consider Foley - Covered with ceftriaxone for CAP/?UTI  HLD CAD Chronic. - Continue ASA, statin  GERD Chronic. - PPI  Gout Chronic.  - Allopurinol  At risk for malnutrition - Nutrition/RD following, appreciate assistance  Hyperglycemia - CBGs Q4H - SSI  Best Practice: (right click and "Reselect all SmartList Selections" daily)  Diet/type: Regular consistency (see orders) DVT prophylaxis: systemic heparin, transition back to DOAC as appropriate GI prophylaxis: PPI Lines: N/A Foley:  N/A Code Status:  full code Last date of multidisciplinary goals of care discussion: 07/14/2022 updated patient/wife at bedside.  Critical care time: N/A   Rhae Lerner [facility] 07/14/22 8:35 AM  Please see [email or web address] for pager  details.  From 7A-7P if no response, please call [phone] After hours, please call ELink [phone]

## 2022-07-14 NOTE — Progress Notes (Signed)
Confirmed with Dr. Carlis Abbott that foley insertion is necessary, per Dr. Carlis Abbott nephrology wants the foley.

## 2022-07-14 NOTE — Progress Notes (Signed)
Initial Nutrition Assessment  DOCUMENTATION CODES:   Non-severe (moderate) malnutrition in context of chronic illness  INTERVENTION:   Magic cup BID with meals, each supplement provides 290 kcal and 9 grams of protein  If po intake remains inadequate, recommend liberalizing diet to Regular until appetite/po intake improve  NUTRITION DIAGNOSIS:   Moderate Malnutrition related to acute illness, chronic illness (acute on chronic CHF, CKD, COPD) as evidenced by moderate muscle depletion, mild fat depletion.  Being addressed via diet advancement, supplements  GOAL:   Patient will meet greater than or equal to 90% of their needs  Progressing  MONITOR:   PO intake, Supplement acceptance, Labs, Weight trends  REASON FOR ASSESSMENT:   Ventilator    ASSESSMENT:   76 yo male admitted with acute on chronic respiratory failure 2/2 HF exacerbation PMH includes CKD IV, COPD, dementia, chronic respiratory failure on home oxygen, HTN, HLD, hx of CVA, CABG x 4  7/17 Admitted 7/19 Extubated  On visit today, pt had eaten more than half of the [place] and cheese sandwich, some chips and ice cream. Wife at bedside and reports pt did not eat much at breakfast. No recorded po intake  Discussed possible oral nutrition supplement with pt and wife; pt does not like Ensure or Boost and not interested in trying this. Pt eating ice cream so RD discussed Magic Cup supplement with pt; pt interested in this at this time  Weight stable; UOP <1 L, Net + 1.5 L. Nephrology following, CKD IV at baseline  Labs: Creatinine 3.75, BUN 65, phosphorus 4.9 Meds ss novolog    Diet Order:   Diet Order             Diet Heart Room service appropriate? Yes; Fluid consistency: Thin  Diet effective now                   EDUCATION NEEDS:   Education needs have been addressed  Skin:  Skin Assessment: Reviewed RN Assessment  Last BM:  PTA  Height:   Ht Readings from Last 1 Encounters:  07/12/22 5'  9" (1.753 m)    Weight:   Wt Readings from Last 1 Encounters:  07/14/22 82.6 kg      BMI:  Body mass index is 26.89 kg/m.  Estimated Nutritional Needs:   Kcal:  1800-2000 kcals  Protein:  110-130 g  Fluid:  1.8 L    Kerman Passey MS, RDN, LDN, CNSC Registered Dietitian 3 Clinical Nutrition RD Pager and On-Call Pager Number Located in [place]

## 2022-07-14 NOTE — Evaluation (Signed)
Occupational Therapy Evaluation Patient Details Name: Joe Wiggins MRN: [id number] DOB: 01-20-1946 Today's Date: 07/14/2022   History of Present Illness 76 yo male admitted 7/17 with acute CHf exacerbation with respiratory failure. Intubated 7/17-7/19. PMHx: COPD, HLD, CAD, ICD, NSTEMI, CABG, vascular dementia, HFrEF, HTN, anemia, gout, OSA with CPAP use, and CKD   Clinical Impression   Patient admitted for the diagnosis above.  PTA he lives at home, is sedentary, and spouse provides assist as needed with ADL and iADL.  He normally walks household distances without an AD.  Generalized weakness and poor activity tolerance are the primary deficits.  Currently he is needing up to Min A for basic mobility in the room and lower body ADL.  OT will follow in the acute setting, and HH OT can be considered depending on progress.        Recommendations for follow up therapy are one component of a multi-disciplinary discharge planning process, led by the attending physician.  Recommendations may be updated based on patient status, additional functional criteria and insurance authorization.   Follow Up Recommendations  Follow physician's recommendations for discharge plan and follow up therapies    Assistance Recommended at Discharge Intermittent Supervision/Assistance  Patient can return home with the following A little help with bathing/dressing/bathroom;Help with stairs or ramp for entrance;Direct supervision/assist for medications management;Assist for transportation;Direct supervision/assist for financial management    Functional Status Assessment  Patient has had a recent decline in their functional status and demonstrates the ability to make significant improvements in function in a reasonable and predictable amount of time.  Equipment Recommendations  None recommended by OT    Recommendations for Other Services       Precautions / Restrictions Precautions Precautions:  Fall Restrictions Weight Bearing Restrictions: No      Mobility Bed Mobility Overal bed mobility: Needs Assistance Bed Mobility: Supine to Sit     Supine to sit: Min assist       Patient Response: Cooperative  Transfers Overall transfer level: Needs assistance   Transfers: Sit to/from Stand, Bed to chair/wheelchair/BSC Sit to Stand: Min guard     Step pivot transfers: Min assist     General transfer comment: LOB back times one, assist to regain, no AD      Balance Overall balance assessment: Needs assistance Sitting-balance support: Feet supported Sitting balance-[name]: Good     Standing balance support: No upper extremity supported Standing balance-[name]: Fair                             ADL either performed or assessed with clinical judgement   ADL Overall ADL's : Needs assistance/impaired     Grooming: Wash/dry hands;Wash/dry face;Set up;Sitting   Upper Body Bathing: Cueing for sequencing;Supervision/ safety   Lower Body Bathing: Minimal assistance;Sit to/from stand   Upper Body Dressing : Minimal assistance;Sitting   Lower Body Dressing: Minimal assistance;Sit to/from stand       Toileting- Water quality scientist and Hygiene: Min guard;Cueing for safety;Cueing for sequencing               Vision Patient Visual Report: No change from baseline       Perception Perception Perception: Within Functional Limits   Praxis Praxis Praxis: Intact    Pertinent Vitals/Pain Pain Assessment Pain Assessment: No/denies pain     Hand Dominance Right   Extremity/Trunk Assessment Upper Extremity Assessment Upper Extremity Assessment: Overall WFL for tasks assessed   Lower  Extremity Assessment Lower Extremity Assessment: Defer to PT evaluation   Cervical / Trunk Assessment Cervical / Trunk Assessment: Kyphotic   Communication Communication Communication: HOH   Cognition Arousal/Alertness: Awake/alert Behavior During  Therapy: WFL for tasks assessed/performed Overall Cognitive Status: History of cognitive impairments - at baseline                                       General Comments   Cues for rest breaks and PLB for O2 saturation    Exercises     Shoulder Instructions      Home Living Family/patient expects to be discharged to:: Private residence Living Arrangements: Spouse/significant other Available Help at Discharge: Family;Available 24 hours/day Type of Home: House Home Access: Stairs to enter [employer] of Steps: 1 Entrance Stairs-Rails: Right Home Layout: One level     Bathroom Shower/Tub: Teacher, early years/pre: Standard Bathroom Accessibility: Yes How Accessible: Accessible via walker Home Equipment: [place] (2 wheels);Cane - single point;BSC/3in1;Shower seat;Wheelchair - Scientist, physiological: Long-handled Conservation officer, historic buildings        Prior Functioning/Environment Prior Level of Function : Needs assist             Mobility Comments: Per PT Eval: walks without AD, mostly sedentary, watches TV ADLs Comments: wife assists with bathing and dressing, wife does the IADLs.  Patient needs assist with money management and medications.  He does not drive        OT Problem List: Decreased activity tolerance;Impaired balance (sitting and/or standing)      OT Treatment/Interventions: Self-care/ADL training;Patient/family education;Balance training;Therapeutic activities;DME and/or AE instruction;Energy conservation    OT Goals(Current goals can be found in the care plan section) Acute Rehab OT Goals Patient Stated Goal: Return home OT Goal Formulation: With patient Time For Goal Achievement: 07/28/22 Potential to Achieve Goals: Good ADL Goals Pt Will Perform Grooming: with supervision;standing Pt Will Perform Lower Body Bathing: with supervision;sit to/from stand Pt Will Perform Lower Body Dressing: with  supervision;sit to/from stand Pt Will Transfer to Toilet: with supervision;ambulating;regular height toilet Pt Will Perform Toileting - Clothing Manipulation and hygiene: with supervision;sit to/from stand  OT Frequency: Min 2X/week    Co-evaluation              AM-PAC OT "6 Clicks" Daily Activity     Outcome Measure Help from another person eating meals?: None Help from another person taking care of personal grooming?: None Help from another person toileting, which includes using toliet, bedpan, or urinal?: A Little Help from another person bathing (including washing, rinsing, drying)?: A Little Help from another person to put on and taking off regular upper body clothing?: A Little Help from another person to put on and taking off regular lower body clothing?: A Little 6 Click Score: 20   End of Session Equipment Utilized During Treatment: Oxygen Nurse Communication: Mobility status  Activity Tolerance: Patient limited by fatigue Patient left: in chair;with call bell/phone within reach;with family/visitor present  OT Visit Diagnosis: Unsteadiness on feet (R26.81)                Time: 1425-1450 OT Time Calculation (min): 25 min Charges:  OT General Charges $OT Visit: 1 Visit OT Evaluation $OT Eval Moderate Complexity: 1 Mod OT Treatments $Self Care/Home Management : 8-22 mins  07/14/2022  RP, OTR/L  Acute Rehabilitation Services  Office:  [phone]   Janice Coffin  Sarra Rachels 07/14/2022, 2:57 PM

## 2022-07-14 NOTE — Progress Notes (Signed)
Joe Wiggins for apixaban>> heparin Indication: atrial fibrillation  Allergies  Allergen Reactions   Ipratropium Other (See Comments)   Metoprolol     Was bringing blood pressure too low and he would pass out.   Morphine And Related Nausea Only   Vicodin [Hydrocodone-Acetaminophen] Other (See Comments)    Hallucinations    Penicillins Rash    Did it involve swelling of the face/tongue/throat, SOB, or low BP? No Did it involve sudden or severe rash/hives, skin peeling, or any reaction on the inside of your mouth or nose? Yes Did you need to seek medical attention at a hospital or doctor's office? No When did it last happen? Over 7 Years Ago      If all above answers are "NO", may proceed with cephalosporin use.      Patient Measurements: Height: 5\' 9"  (175.3 cm) Weight: 82.6 kg (182 lb 1.6 oz) IBW/kg (Calculated) : 70.7 Heparin Dosing Weight: 82 kg   Vital Signs: Temp: 98.3 F (36.8 C) (07/20 0615) Temp Source: Oral (07/20 0615) BP: 151/71 (07/20 0631) Pulse Rate: 113 (07/20 0631)  Labs: Recent Labs    07/11/22 1352 07/11/22 1435 07/11/22 2259 07/12/22 0121 07/12/22 0618 07/12/22 1033 07/12/22 1621 07/12/22 2102 07/13/22 0613 07/14/22 0603  HGB 10.8*   < >  --  9.8*  --  10.5*  --   --  9.7* 9.9*  HCT 35.0*   < >  --  30.3*  --  31.0*  --   --  29.3* 30.2*  PLT 352  --   --  222  --   --   --   --  247 262  APTT  --   --   --   --   --   --   --  92* 101* 71*  LABPROT 20.2*  --   --   --   --   --   --   --   --   --   INR 1.7*  --   --   --   --   --   --   --   --   --   HEPARINUNFRC  --   --   --   --   --   --   --   --  >1.10* >1.10*  CREATININE 3.00*  --   --  3.02*  --   --  3.31* 3.23* 3.54*  --   TROPONINIHS 21*  --  833* 990* 1,166*  --   --   --   --   --    < > = values in this interval not displayed.     Estimated Creatinine Clearance: 17.5 mL/min (A) (by C-G formula based on SCr of 3.54 mg/dL  (H)).   Medical History: Past Medical History:  Diagnosis Date   Abrasion of forearm, right 07/27/2019   Acute respiratory failure (HCC)    AF (paroxysmal atrial fibrillation) (HCC)    AICD (automatic cardioverter/defibrillator) present    CAD (coronary artery disease)    Cardiac arrest ([place])    Cardiomyopathy, ischemic    Carotid artery occlusion    Chronic HFrEF (heart failure with reduced ejection fraction) (HCC)    Chronic respiratory failure (HCC)    CKD (chronic kidney disease), stage IV (HCC)    COPD (chronic obstructive pulmonary disease) ([place])    Cough 11/13/2019   Dementia without behavioral disturbance ([place])    Dizziness 01/17/2016  Facial rash 08/15/2018   Frequent PVCs    GERD (gastroesophageal reflux disease)    Hyperlipidemia    Hypertension    ICD (implantable cardioverter-defibrillator) in place    Ischemic cardiomyopathy    Paroxysmal atrial flutter (HCC)    Pre-syncope 01/16/2018   Premature atrial contractions    PSVT (paroxysmal supraventricular tachycardia) (HCC)    Respiratory distress 12/27/2020   Sleep apnea    Stroke ([place])    TIA (transient ischemic attack)     Medications:  Scheduled:   (feeding supplement) PROSource Plus  30 mL Oral TID   allopurinol  100 mg Oral Daily   arformoterol  15 mcg Nebulization BID   aspirin  81 mg Oral Daily   atorvastatin  40 mg Oral Daily   budesonide (PULMICORT) nebulizer solution  0.5 mg Nebulization BID   carvedilol  25 mg Oral BID   Chlorhexidine Gluconate Cloth  6 each Topical Daily   clopidogrel  75 mg Oral Daily   doxazosin  1 mg Oral Daily   doxycycline  100 mg Oral Q12H   insulin aspart  0-9 Units Subcutaneous Q4H   loratadine  10 mg Oral Daily   revefenacin  175 mcg Nebulization Daily    Assessment: 22 yom presenting with SOB - on apixaban PTA for hx Afib. Last dose on 7/17 at 2137.   Hgb 9.9, plt 262. Heparin level undetectably high, aPTT therapeutic at 71, on heparin 1050 units/hr.  Bleeding at mouth stabilized/improved. No infusion issues.  Given recent DOAC, will monitor both aPTT and heparin level until correlate.   Goal of Therapy:  Heparin level 0.3-0.7 units/ml aPTT 66-102 seconds Monitor platelets by anticoagulation protocol: Yes   Plan:  Continue heparin infusion at 1050 units/hr  Monitor daily HL/aPTT until correlate, CBC, and for s/sx of bleeding  Antonietta Jewel, PharmD, [facility] Pharmacist  Phone: [phone] 07/14/2022 7:30 AM  Please check AMION for all [facility] phone numbers After 10:00 PM, call [place] [phone]

## 2022-07-15 DIAGNOSIS — E440 Moderate protein-calorie malnutrition: Secondary | ICD-10-CM

## 2022-07-15 DIAGNOSIS — R000 Tachycardia, unspecified: Secondary | ICD-10-CM | POA: Diagnosis not present

## 2022-07-15 DIAGNOSIS — J9601 Acute respiratory failure with hypoxia: Secondary | ICD-10-CM | POA: Diagnosis not present

## 2022-07-15 DIAGNOSIS — I5043 Acute on chronic combined systolic (congestive) and diastolic (congestive) heart failure: Secondary | ICD-10-CM | POA: Diagnosis not present

## 2022-07-15 LAB — BASIC METABOLIC PANEL
Anion gap: 14 (ref 5–15)
BUN: 77 mg/dL — ABNORMAL HIGH (ref 8–23)
CO2: 22 mmol/L (ref 22–32)
Calcium: 9.2 mg/dL (ref 8.9–10.3)
Chloride: 101 mmol/L (ref 98–111)
Creatinine, Ser: 3.81 mg/dL — ABNORMAL HIGH (ref 0.61–1.24)
GFR, Estimated: 16 mL/min — ABNORMAL LOW (ref >60)
Glucose, Bld: 104 mg/dL — ABNORMAL HIGH (ref 70–99)
Potassium: 3.8 mmol/L (ref 3.5–5.1)
Sodium: 137 mmol/L (ref 135–145)

## 2022-07-15 LAB — GLUCOSE, CAPILLARY
Glucose-Capillary: 107 mg/dL — ABNORMAL HIGH (ref 70–99)
Glucose-Capillary: 107 mg/dL — ABNORMAL HIGH (ref 70–99)
Glucose-Capillary: 111 mg/dL — ABNORMAL HIGH (ref 70–99)
Glucose-Capillary: 117 mg/dL — ABNORMAL HIGH (ref 70–99)
Glucose-Capillary: 120 mg/dL — ABNORMAL HIGH (ref 70–99)
Glucose-Capillary: 121 mg/dL — ABNORMAL HIGH (ref 70–99)
Glucose-Capillary: 89 mg/dL (ref 70–99)

## 2022-07-15 LAB — APTT: aPTT: 92 seconds — ABNORMAL HIGH (ref 24–36)

## 2022-07-15 LAB — CULTURE, RESPIRATORY W GRAM STAIN
Culture: NORMAL
Special Requests: NORMAL

## 2022-07-15 LAB — PHOSPHORUS: Phosphorus: 5.3 mg/dL — ABNORMAL HIGH (ref 2.5–4.6)

## 2022-07-15 LAB — MAGNESIUM: Magnesium: 2.3 mg/dL (ref 1.7–2.4)

## 2022-07-15 LAB — HEPARIN LEVEL (UNFRACTIONATED): Heparin Unfractionated: >1.1 IU/mL — ABNORMAL HIGH (ref 0.30–0.70)

## 2022-07-15 LAB — TRIGLYCERIDES: Triglycerides: 124 mg/dL (ref <150)

## 2022-07-15 MED ORDER — AMIODARONE HCL 200 MG PO TABS
400.0000 mg | ORAL_TABLET | Freq: Two times a day (BID) | ORAL | Status: DC
  Administered 2022-07-15 – 2022-07-20 (×11): 400 mg via ORAL
  Filled 2022-07-15 (×11): qty 2

## 2022-07-15 MED ORDER — APIXABAN 5 MG PO TABS
5.0000 mg | ORAL_TABLET | Freq: Two times a day (BID) | ORAL | Status: DC
  Administered 2022-07-15 – 2022-07-20 (×11): 5 mg via ORAL
  Filled 2022-07-15 (×11): qty 1

## 2022-07-15 NOTE — Progress Notes (Signed)
Mobility Specialist Progress Note    07/15/22 1244  Mobility  Activity Ambulated with assistance in hallway  Level of Assistance Contact guard assist, steadying assist  Assistive Device Front wheel walker  Distance Ambulated (ft) 140 ft  Activity Response Tolerated fair  $Mobility charge 1 Mobility   Pre-Mobility: 67 HR, 138/75 BP, 99% SpO2 Post-Mobility: 74 HR, 97% SpO2  Pt received in bed and agreeable. On 4LO2, stating his SOB has never been this bad before. Encouraged pursed lip breathing. Returned to chair with call bell in reach.    Hildred Alamin Mobility Specialist

## 2022-07-15 NOTE — Plan of Care (Signed)
  Problem: Clinical Measurements: Goal: Respiratory complications will improve Outcome: Progressing Goal: Cardiovascular complication will be avoided Outcome: Progressing   Problem: Activity: Goal: Risk for activity intolerance will decrease Outcome: Progressing   Problem: Nutrition: Goal: Adequate nutrition will be maintained Outcome: Progressing   Problem: Coping: Goal: Level of anxiety will decrease Outcome: Progressing   Problem: Elimination: Goal: Will not experience complications related to urinary retention Outcome: Progressing   

## 2022-07-15 NOTE — Progress Notes (Signed)
Progress Note  [name] Date of Encounter: 07/15/2022  [facility] Cardiologist: Donato Heinz, MD   Subjective   Awake alert weak but wants to go home  No palpitations Taking PO   Inpatient Medications    Scheduled Meds:  allopurinol  100 mg Oral Daily   arformoterol  15 mcg Nebulization BID   aspirin  81 mg Oral Daily   atorvastatin  40 mg Oral Daily   budesonide (PULMICORT) nebulizer solution  0.5 mg Nebulization BID   carvedilol  25 mg Oral BID   Chlorhexidine Gluconate Cloth  6 each Topical Daily   clopidogrel  75 mg Oral Daily   doxazosin  1 mg Oral Daily   doxycycline  100 mg Oral Q12H   insulin aspart  0-9 Units Subcutaneous Q4H   loratadine  10 mg Oral Daily   revefenacin  175 mcg Nebulization Daily   Continuous Infusions:  amiodarone 30 mg/hr (07/15/22 0027)   cefTRIAXone (ROCEPHIN)  IV 200 mL/hr at 07/14/22 1500   heparin 1,050 Units/hr (07/15/22 0646)   PRN Meds: acetaminophen, albuterol, docusate sodium, guaiFENesin, hydrALAZINE, ondansetron (ZOFRAN) IV, mouth rinse, polyethylene glycol   Vital Signs    Vitals:   07/15/22 0306 07/15/22 0632 07/15/22 0700 07/15/22 0842  BP: 131/61  130/62   Pulse:   62   Resp:   18   Temp: 97.7 F (36.5 C)  97.7 F (36.5 C)   TempSrc: Oral  Oral   SpO2:   96% 95%  Weight:  83.3 kg    Height:        Intake/Output Summary (Last 24 hours) at 07/15/2022 0952 Last data filed at 07/15/2022 0009 Gross per 24 hour  Intake 885.26 ml  Output 1010 ml  Net -124.74 ml       07/15/2022    6:32 AM 07/14/2022    8:32 PM 07/14/2022    5:00 AM  Last 3 Weights  Weight (lbs) 183 lb 10.3 oz 192 lb 14.4 oz 182 lb 1.6 oz  Weight (kg) 83.3 kg 87.5 kg 82.6 kg      Telemetry    Intermittent atach but episodes are less long and frequent  ECG    No new EKGs - Personally Reviewed  Physical Exam    Well developed and nourished in no acute distress HENT normal Neck supple   Clear decreased  breath sounds Regular rate and rhythm, no murmurs or gallops Abd-soft with active BS No Clubbing cyanosis edema Skin-warm and dry A & Oriented  Grossly normal sensory and motor function      Labs    High Sensitivity Troponin:   Recent Labs  Lab 07/11/22 1352 07/11/22 2259 07/12/22 0121 07/12/22 0618  TROPONINIHS 21* 833* 990* 1,166*      Chemistry Recent Labs  Lab 07/11/22 1352 07/11/22 1435 07/13/22 0613 07/14/22 0603 07/15/22 0726  NA 137   < > 140 141 137  K 4.3   < > 4.0 4.2 3.8  CL 101   < > 106 102 101  CO2 22   < > 22 22 22   GLUCOSE 146*   < > 152* 115* 104*  BUN 28*   < > 47* 65* 77*  CREATININE 3.00*   < > 3.54* 3.75* 3.81*  CALCIUM 9.7   < > 9.4 9.4 9.2  MG 2.3   < > 2.4 2.6* 2.3  PROT 7.3  --  6.3*  --   --   ALBUMIN 4.0  --  3.4*  --   --   AST 27  --  36  --   --   ALT 20  --  16  --   --   ALKPHOS 150*  --  104  --   --   BILITOT 0.8  --  0.6  --   --   GFRNONAA 21*   < > 17* 16* 16*  ANIONGAP 14   < > 12 17* 14   < > = values in this interval not displayed.     Lipids  Recent Labs  Lab 07/15/22 0726  TRIG 124     Hematology Recent Labs  Lab 07/12/22 0121 07/12/22 1033 07/13/22 0613 07/14/22 0603  WBC 7.6  --  10.7* 12.1*  RBC 3.20*  --  3.13* 3.16*  HGB 9.8* 10.5* 9.7* 9.9*  HCT 30.3* 31.0* 29.3* 30.2*  MCV 94.7  --  93.6 95.6  MCH 30.6  --  31.0 31.3  MCHC 32.3  --  33.1 32.8  RDW 16.5*  --  17.2* 17.5*  PLT 222  --  247 262    Thyroid  Recent Labs  Lab 07/12/22 1621  TSH 1.029     BNP Recent Labs  Lab 07/11/22 1352 07/13/22 0613  BNP 777.0* 315.4*     DDimer No results for input(s): "DDIMER" in the last 168 hours.   Radiology     DG Abd Portable 1V Result Date: 07/11/2022 CLINICAL DATA:  Orogastric tube placement EXAM: PORTABLE ABDOMEN - 1 VIEW COMPARISON:  12/26/2017 FINDINGS: Orogastric tube enters the stomach with its tip in the mid body. Gas pattern unremarkable. IMPRESSION: Tube tip in the mid body of  the stomach. Electronically Signed   By: Nelson Chimes M.D.   On: 07/11/2022 18:05   DG Chest Portable 1 View Result Date: 07/11/2022 CLINICAL DATA:  A 76 year old male presents post intubation for assessment. EXAM: PORTABLE CHEST 1 VIEW COMPARISON:  April 15, 2022 FINDINGS: Endotracheal tube in place 7.2 cm from the carina, tip between clavicular heads. Gastric tube courses through in off the field the radiograph into the upper abdomen. Multi lead pacer device in place, power pack over the LEFT chest. EKG leads over the chest. Post median sternotomy. Stable cardiomediastinal contours. Diffuse interstitial and alveolar opacities throughout both LEFT and RIGHT chest slightly worse in the RIGHT lower chest. No lobar consolidation. Blunting of LEFT costodiaphragmatic sulcus no pneumothorax. On limited assessment there is no acute skeletal process. IMPRESSION: 1. Endotracheal tube 7.2 cm from the carina. 2. Diffuse interstitial and alveolar opacities throughout both lungs, slightly worse in the RIGHT lower chest. Pattern of disease could reflect asymmetric pulmonary edema or atypical/multifocal infection 3. Question small left effusion. 4. Gastric tube courses through off the field the radiograph into the upper abdomen. Electronically Signed   By: Zetta Bills M.D.   On: 07/11/2022 14:12    Cardiac Studies   07/12/22: TTE 1. Left ventricular ejection fraction, by estimation, is 40 to 45%. The  left ventricle has mildly decreased function. The left ventricle  demonstrates regional wall motion abnormalities (see scoring  diagram/findings for description). Left ventricular  diastolic function could not be evaluated.   2. Right ventricular systolic function is normal. The right ventricular  size is normal. There is mildly elevated pulmonary artery systolic  pressure. The estimated right ventricular systolic pressure is 37.9 mmHg.   3. The mitral valve is grossly normal. Trivial mitral valve  regurgitation.  No evidence of mitral stenosis.  4. The aortic valve is tricuspid. Aortic valve regurgitation is not  visualized. No aortic stenosis is present.   5. The inferior vena cava is normal in size with <50% respiratory  variability, suggesting right atrial pressure of 8 mmHg.   Comparison(s): No significant change from prior study.   Monitor may-June 2023 Patch Wear Time:  8 days and 4 hours (2023-05-08T13:37:46-0400 to 2023-06-01T10:10:26-0400)  Monitor 1 1. Sinus rhythm - avg HR of 117 bpm. (average rate in sinus was 75 bpm) 2. 20 runs of Supraventricular Tachycardia occurred, the run with the fastest interval lasting 44 mins 18 secs with a max rate of 152 bpm,the longest lasting 1 hour 10 mins with an avg rate of 120 bpm 3. Isolated PACs were rare (<1.0%) 4. Isolated PVCs were occasional (2.1%, 1024) 5. No patient-triggered events reported   Monitor 2 1. Sinus rhythm - avg HR of 73 bpm.  2. 1723 runs of Supraventricular Tachycardia occurred, the run with the fastest interval lasting 8 mins 22 secs with a max rate  of 207 bpm, the longest lasting 32 mins 13 secs with an avg rate of 125 bpm. 3. Isolated PACs were occasional (3.3%, 74259) 4.  Isolated PCss were frequent (6.0%, 51417) 5. Ventricular Bigeminy and Trigeminy were present.   Echocardiogram 03/08/22   1. Left ventricular ejection fraction, by estimation, is 40 to 45%. The  left ventricle has mildly decreased function. The left ventricle  demonstrates regional wall motion abnormalities (see scoring  diagram/findings for description). Left ventricular  diastolic parameters are consistent with Grade I diastolic dysfunction  (impaired relaxation).   2. Right ventricular systolic function is normal. The right ventricular  size is normal. There is normal pulmonary artery systolic pressure.   3. Left atrial size was mildly dilated.   4. Right atrial size was mildly dilated.   5. The mitral valve is normal in structure. Trivial  mitral valve  regurgitation.   6. Tricuspid valve regurgitation is mild to moderate.   7. The aortic valve is tricuspid. Aortic valve regurgitation is not  visualized.   8. The inferior vena cava is normal in size with greater than 50%  respiratory variability, suggesting right atrial pressure of 3 mmHg.   Comparison(s): No significant change from prior study. Prior images  reviewed side by side.    12/29/2017: LHC Mid LM lesion is 75% stenosed. Ost LAD to Prox LAD lesion is 100% stenosed. Ost 1st Mrg lesion is 100% stenosed. Prox Cx to Mid Cx lesion is 100% stenosed. Prox RCA to Mid RCA lesion is 100% stenosed. LIMA graft was visualized by angiography and is normal in caliber. The graft exhibits no disease. Seq SVG- OM 1 and PDA graft was visualized by angiography and is normal in caliber. SVG graft was visualized by angiography and is normal in caliber. Origin lesion before 1st Mrg is 50% stenosed. LV end diastolic pressure is moderately elevated.   1. Severe 3 vessel occlusive CAD 2. Patent LIMA to the LAD 3. Patent sequential SVG to the first OM and PDA. Patent Y graft SVG to the diagonal 4. Moderately elevated LVEDP of 30 mm Hg.   Plan: medical management. Diuresis per primary team.      12/27/2017 Study Conclusions - Left ventricle: The cavity size was normal. There was mild   concentric hypertrophy. Systolic function was mildly to   moderately reduced. The estimated ejection fraction was in the   range of 40% to 45%. Features are consistent with a pseudonormal  left ventricular filling pattern, with concomitant abnormal   relaxation and increased filling pressure (grade 2 diastolic   dysfunction). Doppler parameters are consistent with elevated   mean left atrial filling pressure. - Aortic valve: There was no regurgitation. - Mitral valve: There was trivial regurgitation. - Right ventricle: The cavity size was mildly dilated. Wall   thickness was normal. Pacer wire or  catheter noted in right   ventricle. Systolic function was normal. - Right atrium: Pacer wire or catheter noted in right atrium. - Tricuspid valve: There was mild regurgitation. - Pulmonary arteries: Systolic pressure was moderately increased.   PA peak pressure: 53 mm Hg (S). - Inferior vena cava: The vessel was normal in size. - Pericardium, extracardiac: There was no pericardial effusion.    Impressions:   - Since the prior study on 01/18/2016 LVEF has decreased from 50-55%   to 40-45% with basal and mid inferolateral and anterolateral   hypokinesis.      Patient Profile     76 y.o. male  hx of ICM, CAD s/p CABG, AFib, CKD, COPD, HTN, OSA, dementia and prior TIA, sinus node dysfunction PPM, CKD (IV) Jan 2019 hospitalized with a pneumonia, during this stay he suffered a VF arrest, underwent cath >> patent grafts, LVEF 40-45%, no interventions, his PPM upgraded to ICD. who is being seen 07/12/2022 for the evaluation of SVT at the request of Dr. Margaretann Loveless.     Device information SJM dual chamber ICD, (PPM upgraded) 12/29/17, RA lead is from 2014, he has an abandoned RV pacing lead Secondary prevention  Admitted 07/11/22 with progressive SOB, fatigue, perhaps over the last month, SOB became acutely worse and EMS called, treated initially with NEB and then CPAP but was intolerant and on arrival to the ER being ventilated by BVM and promptly intubated in the ER   Cardiology consuted 7/18, EP consulted 7/18 for tachycardia  Assessment & Plan     Atrial tachycardia  Afib paroxysmal   Respiratory failure-multifactorial cardiac and pulmonary-proving  Renal insufficiency acute/chronic  Cardiomyopathy-ischemic prior bypass  Previous exposure to amiodarone  Improved With less atach  Will change amio IV>> PO 400 bid x 2 weeks then 200 bid x 2 weeks then 200 daily  and begin Apixaban    Will sign off call for further questions

## 2022-07-15 NOTE — Progress Notes (Addendum)
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  In the ED he was intubated and admitted to the ICU, creatinine was 3.0 on admission, he was admitted to the ICU for respiratory failure secondary to pulmonary edema and COPD exacerbation   Subjective: Breathing is improving, wants to go home  Assessment and Plan:  Acute hypoxic and hypercarbic respiratory failure Required mechanical ventilation, extubated 7/19 -Diuresed with IV Lasix and treated for COPD exacerbation -Continue to wean O2 down to 2 L  Acute on chronic systolic CHF Complicated by AKI on CKD 4 -Cards and nephrology following, diuresed with IV Lasix initially, diuretics on hold now -Echo with EF of 40-45% with regional wall motion abnormalities, plan for medical management given renal insufficiency and poor functional status -Continue Coreg, aspirin, statin -GDMT limited by CKD  AKI on CKD 4 Urinary retention/suspected BPH -Baseline creatinine around 2.4-2.8, and on admission creatinine> 3, now 3.8 -Continue Foley catheter, add Flomax-Will need Foley catheter at discharge -Defer diuretics to nephrology  COPD exacerbation -Treated with ceftriaxone, bronchodilators -Discontinue ABX after 5 days  Paroxysmal atrial fibrillation -Atrial tachycardia -Appreciate EP input, continue Coreg and changed amiodarone to po -Remains on heparin GTT, transition to Eliquis  NSTEMI CAD/CABG -NSTEMI treated medically, echo with wall motion abnormalities -Continue aspirin, Coreg, statin, remains on IV heparin transition to Eliquis soon   DVT prophylaxis: Heparin Code Status: Full code Family Communication:  Updated wife at bedside Disposition Plan: Home pending improvement in kidney function  Consultants: Cards, nephrology   Procedures:   Antimicrobials:    Objective: Vitals:   07/15/22 0306 07/15/22 0632 07/15/22 0700 07/15/22 0842  BP: 131/61  130/62   Pulse:   62   Resp:   18   Temp: 97.7 F (36.5 C)  97.7 F (36.5 C)   TempSrc: Oral  Oral   SpO2:   96% 95%  Weight:  83.3 kg    Height:        Intake/Output Summary (Last 24 hours) at 07/15/2022 1001 Last data filed at 07/15/2022 0009 Gross per 24 hour  Intake 618.19 ml  Output 1010 ml  Net -391.81 ml   Filed Weights   07/14/22 0500 07/14/22 2032 07/15/22 6659  Weight: 82.6 kg 87.5 kg 83.3 kg    Examination:  General exam: Chronically ill male sitting up in bed, AAO x2, no distress HEENT: Positive JVD CVS: S1-S2, irregularly irregular rhythm Lungs: Clear bilaterally Abdomen: Soft, nontender, bowel sounds present Extremities: Trace edema  Psychiatry:  Mood & affect appropriate.     Data Reviewed:   CBC: Recent Labs  Lab 07/11/22 1352 07/11/22 1435 07/12/22 0121 07/12/22 1033 07/13/22 0613 07/14/22 0603  WBC 13.4*  --  7.6  --  10.7* 12.1*  NEUTROABS 9.8*  --   --   --   --   --   HGB 10.8* 10.5* 9.8* 10.5* 9.7* 9.9*  HCT 35.0* 31.0* 30.3* 31.0* 29.3* 30.2*  MCV 102.0*  --  94.7  --  93.6 95.6  PLT 352  --  222  --  247 935   Basic Metabolic Panel: Recent Labs  Lab 07/12/22 1326 07/12/22 1621 07/12/22 2102 07/13/22 7017 07/14/22  [facility] 07/15/22 0726  NA  --  141 140 140 141 137  K  --  3.8 3.9 4.0 4.2 3.8  CL  --  106 104 106 102 101  CO2  --  21* 20* 22 22 22   GLUCOSE  --  154* 160* 152* 115* 104*  BUN  --  38* 38* 47* 65* 77*  CREATININE  --  3.31* 3.23* 3.54* 3.75* 3.81*  CALCIUM  --  9.7 9.9 9.4 9.4 9.2  MG 2.5* 2.4  --  2.4 2.6* 2.3  PHOS 2.2* 2.7  --  3.7 4.9* 5.3*   GFR: Estimated Creatinine Clearance: 16.2 mL/min (A) (by C-G formula based on SCr of 3.81 mg/dL (H)). Liver  Function Tests: Recent Labs  Lab 07/11/22 1352 07/13/22 0613  AST 27 36  ALT 20 16  ALKPHOS 150* 104  BILITOT 0.8 0.6  PROT 7.3 6.3*  ALBUMIN 4.0 3.4*   No results for input(s): "LIPASE", "AMYLASE" in the last 168 hours. No results for input(s): "AMMONIA" in the last 168 hours. Coagulation Profile: Recent Labs  Lab 07/11/22 1352  INR 1.7*   Cardiac Enzymes: No results for input(s): "CKTOTAL", "CKMB", "CKMBINDEX", "TROPONINI" in the last 168 hours. BNP (last 3 results) No results for input(s): "PROBNP" in the last 8760 hours. HbA1C: No results for input(s): "HGBA1C" in the last 72 hours. CBG: Recent Labs  Lab 07/14/22 2015 07/14/22 2356 07/15/22 0011 07/15/22 0305 07/15/22 0708  GLUCAP 106* 120* 120* 111* 107*   Lipid Profile: Recent Labs    07/15/22 0726  TRIG 124   Thyroid Function Tests: Recent Labs    07/12/22 1621  TSH 1.029   Anemia Panel: Recent Labs    07/14/22 0603  FERRITIN 171  TIBC 283  IRON 99   Urine analysis:    Component Value Date/Time   COLORURINE AMBER (A) 07/13/2022 1900   APPEARANCEUR CLOUDY (A) 07/13/2022 1900   LABSPEC 1.017 07/13/2022 1900   PHURINE 5.0 07/13/2022 1900   GLUCOSEU 150 (A) 07/13/2022 1900   HGBUR MODERATE (A) 07/13/2022 1900   BILIRUBINUR NEGATIVE 07/13/2022 1900   KETONESUR NEGATIVE 07/13/2022 1900   PROTEINUR 100 (A) 07/13/2022 1900   NITRITE NEGATIVE 07/13/2022 1900   LEUKOCYTESUR NEGATIVE 07/13/2022 1900   Sepsis Labs: @LABRCNTIP (procalcitonin:4,lacticidven:4)  ) Recent Results (from the past 240 hour(s))  Blood culture (routine x 2)     Status: None (Preliminary result)   Collection Time: 07/11/22  2:50 PM   Specimen: BLOOD LEFT ARM  Result Value Ref Range Status   Specimen Description BLOOD LEFT ARM  Final   Special Requests   Final    BOTTLES DRAWN AEROBIC AND ANAEROBIC Blood Culture results may not be optimal due to an inadequate volume of blood received in culture bottles   Culture    Final    NO GROWTH 4 DAYS Performed at [facility], [place] [address] [place], [place] [address]    Report Status PENDING  Incomplete  Blood culture (routine x 2)     Status: Abnormal   Collection Time: 07/11/22  3:00 PM   Specimen: BLOOD  Result Value Ref Range Status   Specimen Description BLOOD LEFT ANTECUBITAL  Final   Special Requests   Final    BOTTLES DRAWN AEROBIC AND ANAEROBIC Blood Culture adequate volume   Culture  Setup Time   Final    GRAM POSITIVE RODS ANAEROBIC BOTTLE ONLY CRITICAL RESULT CALLED TO, READ BACK BY AND VERIFIED WITH: [place]  07/12/22@4 :50 BY TW    Culture (A)  Final    BACILLUS SPECIES Standardized susceptibility testing for this organism is not available. Performed at [facility], [place] [address], [place], [place] [address]    Report Status 08/04/2022 FINAL  Final  MRSA Next Gen by PCR, Nasal     Status: None   Collection Time: 07/11/22  5:36 PM   Specimen: Nasal Mucosa; Nasal Swab  Result Value Ref Range Status   MRSA by PCR Next Gen NOT DETECTED NOT DETECTED Final    Comment: (NOTE) The GeneXpert MRSA Assay (FDA approved for NASAL specimens only), is one component of a comprehensive MRSA colonization surveillance program. It is not intended to diagnose MRSA infection nor to guide or monitor treatment for MRSA infections. Test performance is not FDA approved in patients less than 39 years old. Performed at [facility], [place] [address] [place], [place] [address]   Culture, Respiratory w Gram Stain     Status: None   Collection Time: 07/11/22  5:40 PM   Specimen: Tracheal Aspirate; Respiratory  Result Value Ref Range Status   Specimen Description TRACHEAL ASPIRATE  Final   Special Requests Normal  Final   Gram Stain   Final    RARE WBC PRESENT, PREDOMINANTLY PMN FEW GRAM POSITIVE COCCI RARE GRAM VARIABLE ROD    Culture   Final    Normal respiratory flora-no Staph aureus or Pseudomonas seen Performed at [facility], [address] [place], [place] [address]    Report Status 07/15/2022 FINAL  Final  SARS Coronavirus 2 by RT PCR (hospital order, performed in [facility] hospital lab) *cepheid single result test* Anterior Nasal Swab     Status: None   Collection Time: 07/12/22 11:42 AM   Specimen: Anterior Nasal Swab  Result Value Ref Range Status   SARS Coronavirus 2 by RT PCR NEGATIVE NEGATIVE Final    Comment: (NOTE) SARS-CoV-2 target nucleic acids are NOT DETECTED.  The SARS-CoV-2 RNA is generally detectable in upper and lower respiratory specimens during the acute phase of infection. The lowest concentration of SARS-CoV-2 viral copies this assay can detect is 250 copies / mL. A negative result does not preclude SARS-CoV-2 infection and should not be used as the sole basis for treatment or other patient management decisions.  A negative result may occur with improper specimen collection / handling, submission of specimen other than nasopharyngeal swab, presence of viral mutation(s) within the areas targeted by this assay, and inadequate number of viral copies (<250 copies / mL). A negative result must be combined with clinical observations, patient history, and epidemiological information.  Fact Sheet for Patients:   https://www.patel.info/  Fact Sheet for Healthcare Providers: https://hall.com/  This test is not yet approved or  cleared by the [place] FDA and has been authorized for detection and/or diagnosis of SARS-CoV-2 by FDA under an Emergency Use Authorization (EUA).  This EUA will remain in effect (meaning this test can be used) for the duration of the COVID-19 declaration under Section 564(b)(1) of the Act, 21 U.S.C. section 360bbb-3(b)(1), unless the authorization is terminated or revoked sooner.  Performed at [facility], [place] [address], [place], [place] [address]      Radiology Studies: US  RENAL  Result Date: August 04, 2022 CLINICAL DATA:  Acute kidney injury. EXAM: RENAL / URINARY TRACT ULTRASOUND COMPLETE COMPARISON:  None Available. FINDINGS: Right Kidney: Renal measurements: 8.9 x 4.9 x 6.1 cm = volume: 138.4  mL. Increased parenchymal echogenicity. No mass or hydronephrosis visualized. Left Kidney: Renal measurements: 9.3 by 4.5 x 5.1 cm = volume: 112.9 mL. Increased parenchymal echogenicity. No mass or hydronephrosis visualized. Bladder: No focal bladder abnormality. Echogenic debris noted within the dependent portion of the bladder. Other: Diminished exam detail due to large body habitus and overlying bowel gas as well as portable technique. IMPRESSION: 1. No hydronephrosis. 2. Bilateral echogenic kidneys compatible with chronic medical renal disease. 3. Echogenic debris noted within the dependent portion of the urinary bladder, which may be seen in the setting of urinary tract infection. Electronically Signed   By: Kerby Moors M.D.   On: 07/13/2022 16:11     Scheduled Meds:  allopurinol  100 mg Oral Daily   amiodarone  400 mg Oral BID   arformoterol  15 mcg Nebulization BID   aspirin  81 mg Oral Daily   atorvastatin  40 mg Oral Daily   budesonide (PULMICORT) nebulizer solution  0.5 mg Nebulization BID   carvedilol  25 mg Oral BID   Chlorhexidine Gluconate Cloth  6 each Topical Daily   clopidogrel  75 mg Oral Daily   doxazosin  1 mg Oral Daily   doxycycline  100 mg Oral Q12H   insulin aspart  0-9 Units Subcutaneous Q4H   loratadine  10 mg Oral Daily   revefenacin  175 mcg Nebulization Daily   Continuous Infusions:  cefTRIAXone (ROCEPHIN)  IV 200 mL/hr at 07/14/22 1500   heparin 1,050 Units/hr (07/15/22 0646)     LOS: 4 days    Time spent: 56min    Domenic Polite, MD [facility]   07/15/2022, 10:01 AM

## 2022-07-15 NOTE — Progress Notes (Signed)
Progress Note  [name] Date of Encounter: 07/15/2022  [facility] Cardiologist: Donato Heinz, MD   Subjective   Feels well no concerns, wife at bedside  Inpatient Medications    Scheduled Meds:  allopurinol  100 mg Oral Daily   amiodarone  400 mg Oral BID   arformoterol  15 mcg Nebulization BID   aspirin  81 mg Oral Daily   atorvastatin  40 mg Oral Daily   budesonide (PULMICORT) nebulizer solution  0.5 mg Nebulization BID   carvedilol  25 mg Oral BID   Chlorhexidine Gluconate Cloth  6 each Topical Daily   clopidogrel  75 mg Oral Daily   doxazosin  1 mg Oral Daily   doxycycline  100 mg Oral Q12H   insulin aspart  0-9 Units Subcutaneous Q4H   loratadine  10 mg Oral Daily   revefenacin  175 mcg Nebulization Daily   Continuous Infusions:  cefTRIAXone (ROCEPHIN)  IV 200 mL/hr at 07/14/22 1500   heparin 1,050 Units/hr (07/15/22 0646)   PRN Meds: acetaminophen, albuterol, docusate sodium, guaiFENesin, hydrALAZINE, ondansetron (ZOFRAN) IV, mouth rinse, polyethylene glycol   Vital Signs    Vitals:   07/15/22 0306 07/15/22 0632 07/15/22 0700 07/15/22 0842  BP: 131/61  130/62   Pulse:   62   Resp:   18   Temp: 97.7 F (36.5 C)  97.7 F (36.5 C)   TempSrc: Oral  Oral   SpO2:   96% 95%  Weight:  83.3 kg    Height:        Intake/Output Summary (Last 24 hours) at 07/15/2022 1033 Last data filed at 07/15/2022 0009 Gross per 24 hour  Intake 618.19 ml  Output 785 ml  Net -166.81 ml      07/15/2022    6:32 AM 07/14/2022    8:32 PM 07/14/2022    5:00 AM  Last 3 Weights  Weight (lbs) 183 lb 10.3 oz 192 lb 14.4 oz 182 lb 1.6 oz  Weight (kg) 83.3 kg 87.5 kg 82.6 kg      Telemetry    Apace and pvcs, SR- Personally Reviewed  ECG    SR first deg AVB, LVH, PVC - Personally Reviewed  Physical Exam   GEN: No acute distress.   Neck: No JVD Cardiac: RRR, soft systolic murmur.  Respiratory: clear GI: Soft, nontender, non-distended  MS:  trace edema; No deformity. Heels and sacrum dressed. Warm extremities Neuro:  Nonfocal  Psych: Normal affect   Labs    High Sensitivity Troponin:   Recent Labs  Lab 07/11/22 1352 07/11/22 2259 07/12/22 0121 07/12/22 0618  TROPONINIHS 21* 833* 990* 1,166*     Chemistry Recent Labs  Lab 07/11/22 1352 07/11/22 1435 07/13/22 0613 07/14/22 0603 07/15/22 0726  NA 137   < > 140 141 137  K 4.3   < > 4.0 4.2 3.8  CL 101   < > 106 102 101  CO2 22   < > 22 22 22   GLUCOSE 146*   < > 152* 115* 104*  BUN 28*   < > 47* 65* 77*  CREATININE 3.00*   < > 3.54* 3.75* 3.81*  CALCIUM 9.7   < > 9.4 9.4 9.2  MG 2.3   < > 2.4 2.6* 2.3  PROT 7.3  --  6.3*  --   --   ALBUMIN 4.0  --  3.4*  --   --   AST 27  --  36  --   --  ALT 20  --  16  --   --   ALKPHOS 150*  --  104  --   --   BILITOT 0.8  --  0.6  --   --   GFRNONAA 21*   < > 17* 16* 16*  ANIONGAP 14   < > 12 17* 14   < > = values in this interval not displayed.    Lipids  Recent Labs  Lab 07/15/22 0726  TRIG 124    Hematology Recent Labs  Lab 07/12/22 0121 07/12/22 1033 07/13/22 0613 07/14/22 0603  WBC 7.6  --  10.7* 12.1*  RBC 3.20*  --  3.13* 3.16*  HGB 9.8* 10.5* 9.7* 9.9*  HCT 30.3* 31.0* 29.3* 30.2*  MCV 94.7  --  93.6 95.6  MCH 30.6  --  31.0 31.3  MCHC 32.3  --  33.1 32.8  RDW 16.5*  --  17.2* 17.5*  PLT 222  --  247 262   Thyroid  Recent Labs  Lab 07/12/22 1621  TSH 1.029    BNP Recent Labs  Lab 07/11/22 1352 07/13/22 0613  BNP 777.0* 315.4*    DDimer No results for input(s): "DDIMER" in the last 168 hours.   Radiology    US RENAL  Result Date: 07/13/2022 CLINICAL DATA:  Acute kidney injury. EXAM: RENAL / URINARY TRACT ULTRASOUND COMPLETE COMPARISON:  None Available. FINDINGS: Right Kidney: Renal measurements: 8.9 x 4.9 x 6.1 cm = volume: 138.4 mL. Increased parenchymal echogenicity. No mass or hydronephrosis visualized. Left Kidney: Renal measurements: 9.3 by 4.5 x 5.1 cm = volume: 112.9 mL.  Increased parenchymal echogenicity. No mass or hydronephrosis visualized. Bladder: No focal bladder abnormality. Echogenic debris noted within the dependent portion of the bladder. Other: Diminished exam detail due to large body habitus and overlying bowel gas as well as portable technique. IMPRESSION: 1. No hydronephrosis. 2. Bilateral echogenic kidneys compatible with chronic medical renal disease. 3. Echogenic debris noted within the dependent portion of the urinary bladder, which may be seen in the setting of urinary tract infection. Electronically Signed   By: Kerby Moors M.D.   On: 07/13/2022 16:11    Cardiac Studies   Echo as above  Patient Profile     76 y.o. 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  Assessment & Plan    Principal Problem:   Acute exacerbation of CHF (congestive heart failure) (HCC) Active Problems:   Malnutrition of moderate degree  1. Acute on chronic respiratory failure, with suspected AECOPD and acute on chronic HFrEF - unclear precipitant, has baseline severe comorbidities including HFrEF, CKD, and COPD - Echo unchanged from prior, EF mildly reduced. No new regionals compared to prior, in setting of NSTEMI.  - unclear if this is related to ischemia so will treat NSTEMI medically. Wife notes he does not feel well at home and is slowing down, however this is likely multifactorial. - Wife reports he has well controlled BP at home. - nephrology suspects diuresis related AKI and urinary retention.  - continue to hold diuresis, defer to expertise of nephrology regarding renal function. Appears warm and dry on exam.    2. Paroxysmal atrial fib/flutter/SVT,  currently with narrow complex tachycardia - oral amiodarone per EP - on heparin in lieu of Eliquis, resume eliquis per EP  - carvedilol 25 mg BID, EP on board - can consider IV metoprolol if symptomatic episodes of AT however pt notes no symptoms and is normotensive during these episodes. PT agreeable to IV metop prn if needed.   3. AKI on CKD stage IV - baseline CR around 2.4-2.8 recently, nephrology on board, see #1 - not a candidate for ACEI, ARB, ARNI, spirolactone due to this    4. CAD s/p remote CABG 2014, NSTEMI 02/2022 treated medically, HLD, here with recurrent NSTEMI - unknown if this is a primary ACS with de novo CAD versus demand ischemia from multiple physiologic stressors - given continued renal insufficiency and poor baseline functional status recently, anticipate medical therapy - on heparin, ASA, beta blocker, statin - plavix added. Red colored urine x 1, will monitor.   5. Essential HTN - managed in context above   6. Carotid artery disease, potential L subclavian stenosis - occluded RICA, 06-81% LICA with prior stent placement, stenotic L subclavian by duplex 10/2021  7. ICD in place      For questions or updates, please contact [facility] Please consult [email or web address] for contact info under        Signed, Elouise [place], MD  07/15/2022, 10:33 AM

## 2022-07-15 NOTE — Progress Notes (Signed)
Heart Failure Navigator Progress Note  Following this hospitalization to assess for HV TOC readiness.   EF 40-45 % Creat 3.85  Watch per dr. Broadus John for renal improvement.   Earnestine Leys, BSN, Clinical cytogeneticist Only

## 2022-07-15 NOTE — Progress Notes (Addendum)
Update:  switch back to Eliquis 07/15/22  [place] for apixaban>> heparin Indication: atrial fibrillation  Allergies  Allergen Reactions   Ipratropium Other (See Comments)   Metoprolol     Was bringing blood pressure too low and he would pass out.   Morphine And Related Nausea Only   Vicodin [Hydrocodone-Acetaminophen] Other (See Comments)    Hallucinations    Penicillins Rash    Did it involve swelling of the face/tongue/throat, SOB, or low BP? No Did it involve sudden or severe rash/hives, skin peeling, or any reaction on the inside of your mouth or nose? Yes Did you need to seek medical attention at a hospital or doctor's office? No When did it last happen? Over 7 Years Ago      If all above answers are "NO", may proceed with cephalosporin use.      Patient Measurements: Height: 5\' 9"  (175.3 cm) Weight: 83.3 kg (183 lb 10.3 oz) IBW/kg (Calculated) : 70.7 Heparin Dosing Weight: 82 kg   Vital Signs: Temp: 97.7 F (36.5 C) (07/21 0700) Temp Source: Oral (07/21 0700) BP: 130/62 (07/21 0700) Pulse Rate: 62 (07/21 0700)  Labs: Recent Labs    07/12/22 1033 07/12/22 1621 07/13/22 0613 07/14/22 0603 07/15/22 0726  HGB 10.5*  --  9.7* 9.9*  --   HCT 31.0*  --  29.3* 30.2*  --   PLT  --   --  247 262  --   APTT  --    < > 101* 71* 92*  HEPARINUNFRC  --   --  >1.10* >1.10* >1.10*  CREATININE  --    < > 3.54* 3.75* 3.81*   < > = values in this interval not displayed.     Estimated Creatinine Clearance: 16.2 mL/min (A) (by C-G formula based on SCr of 3.81 mg/dL (H)).   Medical History: Past Medical History:  Diagnosis Date   Abrasion of forearm, right 07/27/2019   Acute respiratory failure (HCC)    AF (paroxysmal atrial fibrillation) (HCC)    AICD (automatic cardioverter/defibrillator) present    CAD (coronary artery disease)    Cardiac arrest ([place])    Cardiomyopathy, ischemic    Carotid artery occlusion    Chronic HFrEF  (heart failure with reduced ejection fraction) (HCC)    Chronic respiratory failure (HCC)    CKD (chronic kidney disease), stage IV (HCC)    COPD (chronic obstructive pulmonary disease) ([place])    Cough 11/13/2019   Dementia without behavioral disturbance (HCC)    Dizziness 01/17/2016   Facial rash 08/15/2018   Frequent PVCs    GERD (gastroesophageal reflux disease)    Hyperlipidemia    Hypertension    ICD (implantable cardioverter-defibrillator) in place    Ischemic cardiomyopathy    Paroxysmal atrial flutter (HCC)    Pre-syncope 01/16/2018   Premature atrial contractions    PSVT (paroxysmal supraventricular tachycardia) (HCC)    Respiratory distress 12/27/2020   Sleep apnea    Stroke (HCC)    TIA (transient ischemic attack)     Medications:  Scheduled:   allopurinol  100 mg Oral Daily   arformoterol  15 mcg Nebulization BID   aspirin  81 mg Oral Daily   atorvastatin  40 mg Oral Daily   budesonide (PULMICORT) nebulizer solution  0.5 mg Nebulization BID   carvedilol  25 mg Oral BID   Chlorhexidine Gluconate Cloth  6 each Topical Daily   clopidogrel  75 mg Oral Daily   doxazosin  1 mg Oral Daily   doxycycline  100 mg Oral Q12H   insulin aspart  0-9 Units Subcutaneous Q4H   loratadine  10 mg Oral Daily   revefenacin  175 mcg Nebulization Daily    Assessment: 66 yom presenting with SOB - on apixaban PTA for hx Afib. Last dose on 7/17 at 2137.  Given recent DOAC, will monitor both aPTT and heparin level until correlate.   Anemia d/t chronic disease: hgb 9.7>9.9 and pltc , lwnl/stable on 7/20.  Nephrology plans to  transfuse for hgb <7.  aPTT 92 therapeutic on heparin 1050 units/hr.  No bleeding reported.  Goal of Therapy:  Heparin level 0.3-0.7 units/ml aPTT 66-102 seconds Monitor platelets by anticoagulation protocol: Yes   Plan:  Continue heparin infusion at 1050 units/hr  Monitor daily HL/aPTT until correlate, CBC, and for s/sx of bleeding.  Follow up for  transition back to [place], [place] Pharmacist Phone: [phone] 07/15/2022 8:35 AM  Please check AMION for all [place] phone numbers After 10:00 PM, call [place] [phone]  ADDENDUM:  Pharmacy consulted to switch back to Eliquis .  On Eliquis 5mg  po BID prior to admit for nonvalvular afib.   Plan:  Discontinue IV heparin drip now and give Eliquis 5mg  bid , restart now.  Nicole Cella, RPh Clinical Pharmacist 07/15/2022 11:16 AM

## 2022-07-15 NOTE — Progress Notes (Signed)
[facility] Progress Note    Assessment/ Plan:   AKI on CKD4: Likely secondary to diuresis+urinary retention. Follows w/ Dr. 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 I am suspecting urinary retention played a role w/ AKI, will see where his kidney function settles out to. No indication for renal replacement therapy at this time. -did discuss briefly with wife and patient on 7/20, that if his kidney function does not respond to medical therapy then we may need to consider renal replacement therapy down the road especially given his pre-existing CKD 4. Hopefully, this will not be the case but will follow along -Continue to monitor daily Cr, Dose meds for GFR<15 -Maintain MAP>65 for optimal renal perfusion.  -Avoid nephrotoxic medications including NSAIDs and iodinated intravenous contrast exposure unless the latter is absolutely indicated.  Preferred narcotic agents for pain control are hydromorphone, fentanyl, and methadone. Morphine should not be used. Avoid Baclofen and avoid oral sodium phosphate and magnesium citrate based laxatives / bowel preps. Continue strict Input and Output monitoring. Will monitor the patient closely with you and intervene or adjust therapy as indicated by changes in clinical status/labs    AHRF secondary to acute on chronic COPD and acute on chronic HFrEF -s/p diuresis which is currently on hold -cardio following. COPD management per primary service -extubated 7/19   Hypertension: -BP acceptable   Anemia due to chronic disease: -Transfuse for Hgb<7 g/dL. Iron replete on panel. If hgb fails to further improve, will likely start ESA, hgb up to 9.9 today   PAF -on amio, to start eliquis  Urinary retention -s/p foley 7/20, will likely need urology referral on d/c    Subjective:   Out of icu, pt seen and examined  bedside. Wife at bedside. No acute events. Now with foley. Uop ~1L   Objective:   BP 134/69   Pulse 63   Temp 97.7 F (36.5 C) (Oral)   Resp 20   Ht 5\' 9"  (1.753 m)   Wt 83.3 kg   SpO2 98%   BMI 27.12 kg/m   Intake/Output Summary (Last 24 hours) at 07/15/2022 1236 Last data filed at 07/15/2022 0009 Gross per 24 hour  Intake 563.91 ml  Output 610 ml  Net -46.09 ml   Weight change: 4.9 kg  Physical Exam: Gen:NAD, sitting up in chair CVS: RRR Resp:normal wob, no w/r/r/c KTG:YBWL, slightly distended, NT Ext: trace edema b/l LEs Neuro: awake, following commands  Imaging: US RENAL  Result Date: 07/13/2022 CLINICAL DATA:  Acute kidney injury. EXAM: RENAL / URINARY TRACT ULTRASOUND COMPLETE COMPARISON:  None Available. FINDINGS: Right Kidney: Renal measurements: 8.9 x 4.9 x 6.1 cm = volume: 138.4 mL. Increased parenchymal echogenicity. No mass or hydronephrosis visualized. Left Kidney: Renal measurements: 9.3 by 4.5 x 5.1 cm = volume: 112.9 mL. Increased parenchymal echogenicity. No mass or hydronephrosis visualized. Bladder: No focal bladder abnormality. Echogenic debris noted within the dependent portion of the bladder. Other: Diminished exam detail due to large body habitus and overlying bowel gas as well as portable technique. IMPRESSION: 1. No hydronephrosis. 2. Bilateral echogenic kidneys compatible with chronic medical renal disease. 3. Echogenic debris noted within the dependent portion of the urinary bladder, which may be seen in the setting of urinary tract infection. Electronically Signed   By: Kerby Moors M.D.   On: 07/13/2022 16:11    Labs: [employer]  07/11/22 1352 07/11/22 1435 07/12/22 0121 07/12/22 1033 07/12/22 1326 07/12/22 1621 07/12/22 2102 07/13/22 0613 07/14/22 0603 07/15/22 0726  NA 137   < > 139 139  --  141 140 140 141 137  K 4.3   < > 3.5 3.9  --  3.8 3.9 4.0 4.2 3.8  CL 101  --  104  --   --  106 104 106 102 101  CO2 22  --  21*  --    --  21* 20* 22 22 22   GLUCOSE 146*  --  155*  --   --  154* 160* 152* 115* 104*  BUN 28*  --  29*  --   --  38* 38* 47* 65* 77*  CREATININE 3.00*  --  3.02*  --   --  3.31* 3.23* 3.54* 3.75* 3.81*  CALCIUM 9.7  --  9.5  --   --  9.7 9.9 9.4 9.4 9.2  PHOS  --   --   --   --  2.2* 2.7  --  3.7 4.9* 5.3*   < > = values in this interval not displayed.   CBC Recent Labs  Lab 07/11/22 1352 07/11/22 1435 07/12/22 0121 07/12/22 1033 07/13/22 0613 07/14/22 0603  WBC 13.4*  --  7.6  --  10.7* 12.1*  NEUTROABS 9.8*  --   --   --   --   --   HGB 10.8*   < > 9.8* 10.5* 9.7* 9.9*  HCT 35.0*   < > 30.3* 31.0* 29.3* 30.2*  MCV 102.0*  --  94.7  --  93.6 95.6  PLT 352  --  222  --  247 262   < > = values in this interval not displayed.    Medications:     allopurinol  100 mg Oral Daily   amiodarone  400 mg Oral BID   apixaban  5 mg Oral BID   arformoterol  15 mcg Nebulization BID   aspirin  81 mg Oral Daily   atorvastatin  40 mg Oral Daily   budesonide (PULMICORT) nebulizer solution  0.5 mg Nebulization BID   carvedilol  25 mg Oral BID   Chlorhexidine Gluconate Cloth  6 each Topical Daily   clopidogrel  75 mg Oral Daily   doxazosin  1 mg Oral Daily   doxycycline  100 mg Oral Q12H   insulin aspart  0-9 Units Subcutaneous Q4H   loratadine  10 mg Oral Daily   revefenacin  175 mcg Nebulization Daily      Gean Quint, MD [facility] 07/15/2022, 12:36 PM

## 2022-07-16 DIAGNOSIS — I5043 Acute on chronic combined systolic (congestive) and diastolic (congestive) heart failure: Secondary | ICD-10-CM | POA: Diagnosis not present

## 2022-07-16 DIAGNOSIS — J9601 Acute respiratory failure with hypoxia: Secondary | ICD-10-CM | POA: Diagnosis not present

## 2022-07-16 DIAGNOSIS — E440 Moderate protein-calorie malnutrition: Secondary | ICD-10-CM | POA: Diagnosis not present

## 2022-07-16 LAB — BASIC METABOLIC PANEL
Anion gap: 13 (ref 5–15)
BUN: 81 mg/dL — ABNORMAL HIGH (ref 8–23)
CO2: 20 mmol/L — ABNORMAL LOW (ref 22–32)
Calcium: 8.8 mg/dL — ABNORMAL LOW (ref 8.9–10.3)
Chloride: 100 mmol/L (ref 98–111)
Creatinine, Ser: 3.7 mg/dL — ABNORMAL HIGH (ref 0.61–1.24)
GFR, Estimated: 16 mL/min — ABNORMAL LOW (ref >60)
Glucose, Bld: 107 mg/dL — ABNORMAL HIGH (ref 70–99)
Potassium: 3.6 mmol/L (ref 3.5–5.1)
Sodium: 133 mmol/L — ABNORMAL LOW (ref 135–145)

## 2022-07-16 LAB — CBC
HCT: 27 % — ABNORMAL LOW (ref 39.0–52.0)
Hemoglobin: 8.6 g/dL — ABNORMAL LOW (ref 13.0–17.0)
MCH: 30.6 pg (ref 26.0–34.0)
MCHC: 31.9 g/dL (ref 30.0–36.0)
MCV: 96.1 fL (ref 80.0–100.0)
Platelets: 189 10*3/uL (ref 150–400)
RBC: 2.81 MIL/uL — ABNORMAL LOW (ref 4.22–5.81)
RDW: 16.5 % — ABNORMAL HIGH (ref 11.5–15.5)
WBC: 8.1 10*3/uL (ref 4.0–10.5)
nRBC: 0 % (ref 0.0–0.2)

## 2022-07-16 LAB — GLUCOSE, CAPILLARY
Glucose-Capillary: 113 mg/dL — ABNORMAL HIGH (ref 70–99)
Glucose-Capillary: 154 mg/dL — ABNORMAL HIGH (ref 70–99)
Glucose-Capillary: 107 mg/dL — ABNORMAL HIGH (ref 70–99)
Glucose-Capillary: 137 mg/dL — ABNORMAL HIGH (ref 70–99)
Glucose-Capillary: 123 mg/dL — ABNORMAL HIGH (ref 70–99)

## 2022-07-16 LAB — CULTURE, BLOOD (ROUTINE X 2): Culture: NO GROWTH

## 2022-07-16 LAB — PHOSPHORUS: Phosphorus: 5.5 mg/dL — ABNORMAL HIGH (ref 2.5–4.6)

## 2022-07-16 MED ORDER — HYDROXYZINE HCL 25 MG PO TABS
50.0000 mg | ORAL_TABLET | Freq: Once | ORAL | Status: AC
  Administered 2022-07-16: 50 mg via ORAL
  Filled 2022-07-16: qty 2

## 2022-07-16 MED ORDER — DARBEPOETIN ALFA 100 MCG/0.5ML IJ SOSY
100.0000 ug | PREFILLED_SYRINGE | INTRAMUSCULAR | Status: DC
  Administered 2022-07-16: 100 ug via SUBCUTANEOUS
  Filled 2022-07-16: qty 0.5

## 2022-07-16 MED ORDER — POLYETHYLENE GLYCOL 3350 17 G PO PACK
17.0000 g | PACK | Freq: Every day | ORAL | Status: DC
  Administered 2022-07-16 – 2022-07-17 (×2): 17 g via ORAL
  Filled 2022-07-16 (×2): qty 1

## 2022-07-16 MED ORDER — INSULIN ASPART 100 UNIT/ML IJ SOLN
0.0000 [IU] | Freq: Three times a day (TID) | INTRAMUSCULAR | Status: DC
  Administered 2022-07-17 – 2022-07-20 (×4): 1 [IU] via SUBCUTANEOUS

## 2022-07-16 NOTE — Progress Notes (Signed)
Mobility Specialist Progress Note    07/16/22 1058  Mobility  Activity Ambulated with assistance in hallway  Level of Assistance Minimal assist, patient does 75% or more  Assistive Device Front wheel walker  Distance Ambulated (ft) 140 ft (100+40)  Activity Response Tolerated fair  $Mobility charge 1 Mobility   Pre-Mobility: 71 HR, 157/65 BP, 97% SpO2 During Mobility: 83 HR, 95% SpO2 Post-Mobility: 76 HR, 157/73 BP, 96% SpO2  Pt received in bed and agreeable. ON 4LO2. C/o being swimmy headed and stated he has never felt this bad in a hospital before. Took x1 extended seated rest break. Encouraged pursed lip breathing. Returned to chair with call bell in reach. RN notified.   Hildred Alamin Mobility Specialist

## 2022-07-16 NOTE — Progress Notes (Signed)
[facility] Progress Note    Assessment/ Plan:   AKI on CKD4: Likely secondary to diuresis+urinary retention. Follows w/ Dr. 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 I am suspecting urinary retention played a role w/ AKI. No indication for renal replacement therapy at this time. -Continue to monitor daily Cr, Dose meds for GFR<15 -Maintain MAP>65 for optimal renal perfusion.  -Avoid nephrotoxic medications including NSAIDs and iodinated intravenous contrast exposure unless the latter is absolutely indicated.  Preferred narcotic agents for pain control are hydromorphone, fentanyl, and methadone. Morphine should not be used. Avoid Baclofen and avoid oral sodium phosphate and magnesium citrate based laxatives / bowel preps. Continue strict Input and Output monitoring. Will monitor the patient closely with you and intervene or adjust therapy as indicated by changes in clinical status/labs    AHRF secondary to acute on chronic COPD and acute on chronic HFrEF -s/p diuresis which is currently on hold -cardio following. COPD management per primary service -extubated 7/19   Hypertension: -BP acceptable   Anemia due to chronic disease: -Transfuse for Hgb<7 g/dL. Iron replete on panel. If hgb fails to further improve, will likely start ESA, hgb down to 8.6 today, will dose ESA subq 7/22   PAF -on amio, eliquis  Urinary retention -s/p foley 7/20, will likely need urology referral on d/c    Subjective:   No acute events overnight. Patient eager to go home. Uop ~1.2L, cr down to 3.7   Objective:   BP (!) 162/68 (BP Location: Right Arm)   Pulse 65   Temp 97.6 F (36.4 C) (Oral)   Resp 17   Ht 5\' 9"  (1.753 m)   Wt 83.6 kg   SpO2 94%   BMI 27.22 kg/m   Intake/Output Summary (Last 24 hours) at 07/16/2022 0734 Last data filed at 07/15/2022  2349 Gross per 24 hour  Intake --  Output 1250 ml  Net -1250 ml   Weight change: -3.9 kg  Physical Exam: Gen:NAD, laying flat in bed CVS: RRR Resp:normal wob, no w/r/r/c YIF:OYDX, NT Ext: no sig edema b/l LEs Neuro: awake, alert  Imaging: No results found.  Labs: BMET Recent Labs  Lab 07/12/22 0121 07/12/22 1033 07/12/22 1326 07/12/22 1621 07/12/22 2102 07/13/22 0613 07/14/22 0603 07/15/22 0726 07/16/22 0042  NA 139 139  --  141 140 140 141 137 133*  K 3.5 3.9  --  3.8 3.9 4.0 4.2 3.8 3.6  CL 104  --   --  106 104 106 102 101 100  CO2 21*  --   --  21* 20* 22 22 22  20*  GLUCOSE 155*  --   --  154* 160* 152* 115* 104* 107*  BUN 29*  --   --  38* 38* 47* 65* 77* 81*  CREATININE 3.02*  --   --  3.31* 3.23* 3.54* 3.75* 3.81* 3.70*  CALCIUM 9.5  --   --  9.7 9.9 9.4 9.4 9.2 8.8*  PHOS  --   --  2.2* 2.7  --  3.7 4.9* 5.3* 5.5*   CBC Recent Labs  Lab 07/11/22 1352 07/11/22 1435 07/12/22 0121 07/12/22 1033 07/13/22 0613 07/14/22 0603 07/16/22 0042  WBC 13.4*  --  7.6  --  10.7* 12.1* 8.1  NEUTROABS 9.8*  --   --   --   --   --   --   HGB  10.8*   < > 9.8* 10.5* 9.7* 9.9* 8.6*  HCT 35.0*   < > 30.3* 31.0* 29.3* 30.2* 27.0*  MCV 102.0*  --  94.7  --  93.6 95.6 96.1  PLT 352  --  222  --  247 262 189   < > = values in this interval not displayed.    Medications:     allopurinol  100 mg Oral Daily   amiodarone  400 mg Oral BID   apixaban  5 mg Oral BID   arformoterol  15 mcg Nebulization BID   aspirin  81 mg Oral Daily   atorvastatin  40 mg Oral Daily   budesonide (PULMICORT) nebulizer solution  0.5 mg Nebulization BID   carvedilol  25 mg Oral BID   Chlorhexidine Gluconate Cloth  6 each Topical Daily   clopidogrel  75 mg Oral Daily   doxazosin  1 mg Oral Daily   doxycycline  100 mg Oral Q12H   insulin aspart  0-9 Units Subcutaneous Q4H   loratadine  10 mg Oral Daily   revefenacin  175 mcg Nebulization Daily      Gean Quint, MD [facility] 07/16/2022, 7:34 AM

## 2022-07-16 NOTE — Progress Notes (Addendum)
Progress Note  [name] Date of Encounter: 07/16/2022  [facility] Cardiologist: Donato Heinz, MD   Subjective   Feels well no concerns, wife at bedside  Inpatient Medications    Scheduled Meds:  allopurinol  100 mg Oral Daily   amiodarone  400 mg Oral BID   apixaban  5 mg Oral BID   arformoterol  15 mcg Nebulization BID   atorvastatin  40 mg Oral Daily   budesonide (PULMICORT) nebulizer solution  0.5 mg Nebulization BID   carvedilol  25 mg Oral BID   Chlorhexidine Gluconate Cloth  6 each Topical Daily   clopidogrel  75 mg Oral Daily   darbepoetin (ARANESP) injection - NON-DIALYSIS  100 mcg Subcutaneous Q Sat-1800   doxazosin  1 mg Oral Daily   insulin aspart  0-9 Units Subcutaneous Q4H   loratadine  10 mg Oral Daily   polyethylene glycol  17 g Oral Daily   revefenacin  175 mcg Nebulization Daily   Continuous Infusions:  cefTRIAXone (ROCEPHIN)  IV 1 g (07/15/22 1141)   PRN Meds: acetaminophen, albuterol, docusate sodium, guaiFENesin, hydrALAZINE, ondansetron (ZOFRAN) IV, mouth rinse, polyethylene glycol   Vital Signs    Vitals:   07/15/22 2344 07/16/22 0311 07/16/22 0730 07/16/22 1106  BP: 125/76 (!) 147/76 (!) 162/68 (!) 143/80  Pulse: 73 65 65 65  Resp: 19 17 17 16   Temp: (!) 97.5 F (36.4 C) 97.6 F (36.4 C) 97.6 F (36.4 C) 98 F (36.7 C)  TempSrc: Oral Oral Oral Oral  SpO2: 98% 98% 94% 93%  Weight:  83.6 kg    Height:        Intake/Output Summary (Last 24 hours) at 07/16/2022 1146 Last data filed at 07/16/2022 0734 Gross per 24 hour  Intake --  Output 1950 ml  Net -1950 ml      07/16/2022    3:11 AM 07/15/2022    6:32 AM 07/14/2022    8:32 PM  Last 3 Weights  Weight (lbs) 184 lb 4.9 oz 183 lb 10.3 oz 192 lb 14.4 oz  Weight (kg) 83.6 kg 83.3 kg 87.5 kg      Telemetry    Apace and pvcs, SR- Personally Reviewed  ECG    SR first deg AVB, LVH, PVC - Personally Reviewed  Physical Exam   GEN: No acute distress.    Neck: No JVD Cardiac: RRR, soft systolic murmur.  Respiratory: right base crackles GI: Soft, nontender, non-distended  MS: trace edema; No deformity. Heels and sacrum dressed. Warm extremities Neuro:  Nonfocal  Psych: Normal affect   Labs    High Sensitivity Troponin:   Recent Labs  Lab 07/11/22 1352 07/11/22 2259 07/12/22 0121 07/12/22 0618  TROPONINIHS 21* 833* 990* 1,166*     Chemistry Recent Labs  Lab 07/11/22 1352 07/11/22 1435 07/13/22 0613 07/14/22 0603 07/15/22 0726 07/16/22 0042  NA 137   < > 140 141 137 133*  K 4.3   < > 4.0 4.2 3.8 3.6  CL 101   < > 106 102 101 100  CO2 22   < > 22 22 22  20*  GLUCOSE 146*   < > 152* 115* 104* 107*  BUN 28*   < > 47* 65* 77* 81*  CREATININE 3.00*   < > 3.54* 3.75* 3.81* 3.70*  CALCIUM 9.7   < > 9.4 9.4 9.2 8.8*  MG 2.3   < > 2.4 2.6* 2.3  --   PROT 7.3  --  6.3*  --   --   --   ALBUMIN 4.0  --  3.4*  --   --   --   AST 27  --  36  --   --   --   ALT 20  --  16  --   --   --   ALKPHOS 150*  --  104  --   --   --   BILITOT 0.8  --  0.6  --   --   --   GFRNONAA 21*   < > 17* 16* 16* 16*  ANIONGAP 14   < > 12 17* 14 13   < > = values in this interval not displayed.    Lipids  Recent Labs  Lab 07/15/22 0726  TRIG 124    Hematology Recent Labs  Lab 07/13/22 0613 07/14/22 0603 07/16/22 0042  WBC 10.7* 12.1* 8.1  RBC 3.13* 3.16* 2.81*  HGB 9.7* 9.9* 8.6*  HCT 29.3* 30.2* 27.0*  MCV 93.6 95.6 96.1  MCH 31.0 31.3 30.6  MCHC 33.1 32.8 31.9  RDW 17.2* 17.5* 16.5*  PLT 247 262 189   Thyroid  Recent Labs  Lab 07/12/22 1621  TSH 1.029    BNP Recent Labs  Lab 07/11/22 1352 07/13/22 0613  BNP 777.0* 315.4*    DDimer No results for input(s): "DDIMER" in the last 168 hours.   Radiology    No results found.  Cardiac Studies   Echo as above  Patient Profile     76 y.o. 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  Assessment & Plan    Principal Problem:   Acute exacerbation of CHF (congestive heart failure) (HCC) Active Problems:   Malnutrition of moderate degree  1. Acute on chronic respiratory failure, with suspected AECOPD and acute on chronic HFrEF - unclear precipitant, has baseline severe comorbidities including HFrEF, CKD, and COPD - Echo unchanged from prior, EF mildly reduced. No new regionals compared to prior, in setting of NSTEMI.  - unclear if this is related to ischemia so will treat NSTEMI medically. Wife notes he does not feel well at home and is slowing down, however this is likely multifactorial. We discussed possibility of outpatient [address] D spect nuclear study to risk stratify his troponin elevation. - Wife reports he has well controlled BP at home. - nephrology suspects diuresis related AKI and urinary retention.  - continue to hold diuresis, defer to expertise of nephrology regarding renal function. Appears warm and dry on exam.   2. Paroxysmal atrial fib/flutter/SVT, currently with narrow complex tachycardia - oral amiodarone per EP - on heparin in lieu of Eliquis, resume eliquis per EP  - carvedilol 25 mg BID, EP on board - can consider IV metoprolol if symptomatic episodes of AT however pt notes no symptoms and is normotensive during these episodes. PT agreeable to IV metop prn if needed. Has not had episodes in at least 48 hr.   3. AKI on CKD stage IV - baseline CR around 2.4-2.8 recently, nephrology on board, see #1 - not a candidate for ACEI, ARB, ARNI, spirolactone due to this    4. CAD s/p remote  CABG 2014, NSTEMI 02/2022 treated medically, HLD, here with recurrent NSTEMI - unknown if this is a  primary ACS with de novo CAD versus demand ischemia from multiple physiologic stressors - given continued renal insufficiency and poor baseline functional status recently, anticipate medical therapy - on heparin, beta blocker, statin. Stopped ASA. - plavix added. Red colored urine x 1, will monitor. No other bleeding. Dual therapy with eliquis for AF.   5. Essential HTN - managed in context above   6. Carotid artery disease, potential L subclavian stenosis - occluded RICA, 73-95% LICA with prior stent placement, stenotic L subclavian by duplex 10/2021  7. ICD in place  Cardiology will follow as needed over the weekend. Diuresis can resume when appropriate from nephrology standpoint.   For questions or updates, please contact [place] Please consult [email or web address] for contact info under        Signed, Elouise Munroe, MD  07/16/2022, 11:46 AM

## 2022-07-16 NOTE — Plan of Care (Signed)
  Problem: Elimination: Goal: Will not experience complications related to urinary retention Outcome: Not Progressing   Problem: Safety: Goal: Non-violent Restraint(s) Outcome: Not Applicable   Problem: Education: Goal: Knowledge of General Education information will improve Description: Including pain rating scale, medication(s)/side effects and non-pharmacologic comfort measures Outcome: Progressing   Problem: Health Behavior/Discharge Planning: Goal: Ability to manage health-related needs will improve Outcome: Progressing   Problem: Clinical Measurements: Goal: Ability to maintain clinical measurements within normal limits will improve Outcome: Progressing Goal: Will remain free from infection Outcome: Progressing Goal: Diagnostic test results will improve Outcome: Progressing Goal: Respiratory complications will improve Outcome: Progressing Goal: Cardiovascular complication will be avoided Outcome: Progressing   Problem: Activity: Goal: Risk for activity intolerance will decrease Outcome: Progressing   Problem: Nutrition: Goal: Adequate nutrition will be maintained Outcome: Progressing   Problem: Coping: Goal: Level of anxiety will decrease Outcome: Progressing   Problem: Elimination: Goal: Will not experience complications related to bowel motility Outcome: Progressing   Problem: Pain Managment: Goal: General experience of comfort will improve Outcome: Progressing   Problem: Safety: Goal: Ability to remain free from injury will improve Outcome: Progressing   Problem: Skin Integrity: Goal: Risk for impaired skin integrity will decrease Outcome: Progressing   Problem: Coping: Goal: Ability to adjust to condition or change in health will improve Outcome: Progressing   Problem: Fluid Volume: Goal: Ability to maintain a balanced intake and output will improve Outcome: Progressing   Problem: Metabolic: Goal: Ability to maintain appropriate glucose  levels will improve Outcome: Progressing   Problem: Nutritional: Goal: Maintenance of adequate nutrition will improve Outcome: Progressing Goal: Progress toward achieving an optimal weight will improve Outcome: Progressing   Problem: Skin Integrity: Goal: Risk for impaired skin integrity will decrease Outcome: Progressing   Problem: Tissue Perfusion: Goal: Adequacy of tissue perfusion will improve Outcome: Progressing

## 2022-07-16 NOTE — Progress Notes (Signed)
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  In the ED he was intubated and admitted to the ICU, creatinine was 3.0 on admission, he was admitted to the ICU for respiratory failure secondary to pulmonary edema and COPD exacerbation. -Extubated 7/19, hospital course complicated by AKI on CKD 4 -Transferred from ICU to [facility] service 7/21   Subjective: -Feels okay, no events overnight,  Assessment and Plan:  Acute hypoxic and hypercarbic respiratory failure Required mechanical ventilation, extubated 7/19 -Diuresed with IV Lasix and treated for COPD exacerbation -Continue to wean O2 down to 2 L, diuretics on hold now  Acute on chronic systolic CHF Complicated by AKI on CKD 4 -Cards and nephrology following, diuresed with IV Lasix initially, diuretics on hold now, creatinine appears to be plateauing 3.7 today -Echo with EF of 40-45% with regional wall motion abnormalities, plan for medical management given renal insufficiency and poor functional status -Continue Coreg, aspirin, statin -GDMT limited by CKD  NSTEMI CAD/CABG -NSTEMI treated medically, echo with wall motion abnormalities -Continue aspirin, Coreg, statin, treated with IV heparin X 48 hours, now back on Eliquis, also started on Plavix, will discuss bleeding risk with cards considering DAPT and Eliquis  AKI on CKD 4 Urinary retention/suspected BPH -Baseline creatinine around 2.4-2.8, and on admission creatinine> 3, now 3.8, creatinine plateauing -Continue Foley catheter, add Flomax-Will need Foley catheter at discharge -Defer diuretics to nephrology  COPD exacerbation -Treated with ceftriaxone,  bronchodilators -Discontinue ABX after 5 days  Paroxysmal atrial fibrillation -Atrial tachycardia -Appreciate EP input, continue Coreg and changed amiodarone to po -Continue Eliquis  DVT prophylaxis: Eliquis Code Status: DNR, discussed CODE STATUS with patient and spouse today Family Communication: Updated wife at bedside Disposition Plan: Home pending improvement in kidney function  Consultants: Cards, nephrology   Procedures:   Antimicrobials:    Objective: Vitals:   07/15/22 2344 07/16/22 0311 07/16/22 0730 07/16/22 1106  BP: 125/76 (!) 147/76 (!) 162/68 (!) 143/80  Pulse: 73 65 65 65  Resp: 19 17 17 16   Temp: (!) 97.5 F (36.4 C) 97.6 F (36.4 C) 97.6 F (36.4 C) 98 F (36.7 C)  TempSrc: Oral Oral Oral Oral  SpO2: 98% 98% 94% 93%  Weight:  83.6 kg    Height:        Intake/Output Summary (Last 24 hours) at 07/16/2022 1111 Last data filed at 07/16/2022 0734 Gross per 24 hour  Intake --  Output 1950 ml  Net -1950 ml   Filed Weights   07/14/22 2032 07/15/22 0632 07/16/22 0311  Weight: 87.5 kg 83.3 kg 83.6 kg    Examination:  General exam: Chronically ill male sitting up in bed, AAO x2, no distress HEENT: Positive JVD CVS: S1-S2, irregularly irregular rhythm Lungs: Few basilar rales, improving air movement Abdomen: Soft, nontender, bowel sounds present Extremities: Trace edema  Psychiatry:  Mood & affect appropriate.     Data Reviewed:   CBC: Recent Labs  Lab 07/11/22 1352 07/11/22 1435 07/12/22 0121 07/12/22 1033 07/13/22 0613 07/14/22 0603 07/16/22 0042  WBC 13.4*  --  7.6  --  10.7* 12.1* 8.1  NEUTROABS 9.8*  --   --   --   --   --   --  HGB 10.8*   < > 9.8* 10.5* 9.7* 9.9* 8.6*  HCT 35.0*   < > 30.3* 31.0* 29.3* 30.2* 27.0*  MCV 102.0*  --  94.7  --  93.6 95.6 96.1  PLT 352  --  222  --  247 262 189   < > = values in this interval not displayed.   Basic Metabolic Panel: Recent Labs  Lab 07/12/22 1326 07/12/22 1621 07/12/22 2102  07/13/22 6213 07/14/22 0603 07/15/22 0726 07/16/22 0042  NA  --  141 140 140 141 137 133*  K  --  3.8 3.9 4.0 4.2 3.8 3.6  CL  --  106 104 106 102 101 100  CO2  --  21* 20* 22 22 22  20*  GLUCOSE  --  154* 160* 152* 115* 104* 107*  BUN  --  38* 38* 47* 65* 77* 81*  CREATININE  --  3.31* 3.23* 3.54* 3.75* 3.81* 3.70*  CALCIUM  --  9.7 9.9 9.4 9.4 9.2 8.8*  MG 2.5* 2.4  --  2.4 2.6* 2.3  --   PHOS 2.2* 2.7  --  3.7 4.9* 5.3* 5.5*   GFR: Estimated Creatinine Clearance: 16.7 mL/min (A) (by C-G formula based on SCr of 3.7 mg/dL (H)). Liver Function Tests: Recent Labs  Lab 07/11/22 1352 07/13/22 0613  AST 27 36  ALT 20 16  ALKPHOS 150* 104  BILITOT 0.8 0.6  PROT 7.3 6.3*  ALBUMIN 4.0 3.4*   No results for input(s): "LIPASE", "AMYLASE" in the last 168 hours. No results for input(s): "AMMONIA" in the last 168 hours. Coagulation Profile: Recent Labs  Lab 07/11/22 1352  INR 1.7*   Cardiac Enzymes: No results for input(s): "CKTOTAL", "CKMB", "CKMBINDEX", "TROPONINI" in the last 168 hours. BNP (last 3 results) No results for input(s): "PROBNP" in the last 8760 hours. HbA1C: No results for input(s): "HGBA1C" in the last 72 hours. CBG: Recent Labs  Lab 07/15/22 1934 07/15/22 2348 07/16/22 0341 07/16/22 0812 07/16/22 1107  GLUCAP 121* 107* 113* 154* 107*   Lipid Profile: Recent Labs    07/15/22 0726  TRIG 124   Thyroid Function Tests: No results for input(s): "TSH", "T4TOTAL", "FREET4", "T3FREE", "THYROIDAB" in the last 72 hours.  Anemia Panel: Recent Labs    07/14/22 0603  FERRITIN 171  TIBC 283  IRON 99   Urine analysis:    Component Value Date/Time   COLORURINE AMBER (A) 07/13/2022 1900   APPEARANCEUR CLOUDY (A) 07/13/2022 1900   LABSPEC 1.017 07/13/2022 1900   PHURINE 5.0 07/13/2022 1900   GLUCOSEU 150 (A) 07/13/2022 1900   HGBUR MODERATE (A) 07/13/2022 1900   BILIRUBINUR NEGATIVE 07/13/2022 1900   KETONESUR NEGATIVE 07/13/2022 1900   PROTEINUR  100 (A) 07/13/2022 1900   NITRITE NEGATIVE 07/13/2022 1900   LEUKOCYTESUR NEGATIVE 07/13/2022 1900   Sepsis Labs: @LABRCNTIP (procalcitonin:4,lacticidven:4)  ) Recent Results (from the past 240 hour(s))  Blood culture (routine x 2)     Status: None   Collection Time: 07/11/22  2:50 PM   Specimen: BLOOD LEFT ARM  Result Value Ref Range Status   Specimen Description BLOOD LEFT ARM  Final   Special Requests   Final    BOTTLES DRAWN AEROBIC AND ANAEROBIC Blood Culture results may not be optimal due to an inadequate volume of blood received in culture bottles   Culture   Final    NO GROWTH 5 DAYS Performed at [facility], [place] [address], [place], [place] [address]    Report  Status 07/16/2022 FINAL  Final  Blood culture (routine x 2)     Status: Abnormal   Collection Time: 07/11/22  3:00 PM   Specimen: BLOOD  Result Value Ref Range Status   Specimen Description BLOOD LEFT ANTECUBITAL  Final   Special Requests   Final    BOTTLES DRAWN AEROBIC AND ANAEROBIC Blood Culture adequate volume   Culture  Setup Time   Final    GRAM POSITIVE RODS ANAEROBIC BOTTLE ONLY CRITICAL RESULT CALLED TO, READ BACK BY AND VERIFIED WITH: PHARMD JAMES LEDFORD 07/12/22@4 :50 BY TW    Culture (A)  Final    BACILLUS SPECIES Standardized susceptibility testing for this organism is not available. Performed at [facility], [place] [address], [place], [place] [address]    Report Status 07/13/2022 FINAL  Final  MRSA Next Gen by PCR, Nasal     Status: None   Collection Time: 07/11/22  5:36 PM   Specimen: Nasal Mucosa; Nasal Swab  Result Value Ref Range Status   MRSA by PCR Next Gen NOT DETECTED NOT DETECTED Final    Comment: (NOTE) The GeneXpert MRSA Assay (FDA approved for NASAL specimens only), is one component of a comprehensive MRSA colonization surveillance program. It is not intended to diagnose MRSA infection nor to guide or monitor treatment for MRSA infections. Test performance is not FDA  approved in patients less than 87 years old. Performed at [facility], [place] [address] [place], [place] [address]   Culture, Respiratory w Gram Stain     Status: None   Collection Time: 07/11/22  5:40 PM   Specimen: Tracheal Aspirate; Respiratory  Result Value Ref Range Status   Specimen Description TRACHEAL ASPIRATE  Final   Special Requests Normal  Final   Gram Stain   Final    RARE WBC PRESENT, PREDOMINANTLY PMN FEW GRAM POSITIVE COCCI RARE GRAM VARIABLE ROD    Culture   Final    Normal respiratory flora-no Staph aureus or Pseudomonas seen Performed at [facility], [address], [place], [place] [address]    Report Status 07/15/2022 FINAL  Final  SARS Coronavirus 2 by RT PCR (hospital order, performed in [facility] hospital lab) *cepheid single result test* Anterior Nasal Swab     Status: None   Collection Time: 07/12/22 11:42 AM   Specimen: Anterior Nasal Swab  Result Value Ref Range Status   SARS Coronavirus 2 by RT PCR NEGATIVE NEGATIVE Final    Comment: (NOTE) SARS-CoV-2 target nucleic acids are NOT DETECTED.  The SARS-CoV-2 RNA is generally detectable in upper and lower respiratory specimens during the acute phase of infection. The lowest concentration of SARS-CoV-2 viral copies this assay can detect is 250 copies / mL. A negative result does not preclude SARS-CoV-2 infection and should not be used as the sole basis for treatment or other patient management decisions.  A negative result may occur with improper specimen collection / handling, submission of specimen other than nasopharyngeal swab, presence of viral mutation(s) within the areas targeted by this assay, and inadequate number of viral copies (<250 copies / mL). A negative result must be combined with clinical observations, patient history, and epidemiological information.  Fact Sheet for Patients:   https://www.patel.info/  Fact Sheet for Healthcare  Providers: https://hall.com/  This test is not yet approved or  cleared by the [place] FDA and has been authorized for detection and/or diagnosis of SARS-CoV-2 by FDA under an Emergency Use Authorization (EUA).  This  EUA will remain in effect (meaning this test can be used) for the duration of the COVID-19 declaration under Section 564(b)(1) of the Act, 21 U.S.C. section 360bbb-3(b)(1), unless the authorization is terminated or revoked sooner.  Performed at [facility], [place] [address] [place], [place] [address]      Radiology Studies: No results found.   Scheduled Meds:  allopurinol  100 mg Oral Daily   amiodarone  400 mg Oral BID   apixaban  5 mg Oral BID   arformoterol  15 mcg Nebulization BID   aspirin  81 mg Oral Daily   atorvastatin  40 mg Oral Daily   budesonide (PULMICORT) nebulizer solution  0.5 mg Nebulization BID   carvedilol  25 mg Oral BID   Chlorhexidine Gluconate Cloth  6 each Topical Daily   clopidogrel  75 mg Oral Daily   darbepoetin (ARANESP) injection - NON-DIALYSIS  100 mcg Subcutaneous Q Sat-1800   doxazosin  1 mg Oral Daily   insulin aspart  0-9 Units Subcutaneous Q4H   loratadine  10 mg Oral Daily   polyethylene glycol  17 g Oral Daily   revefenacin  175 mcg Nebulization Daily   Continuous Infusions:  cefTRIAXone (ROCEPHIN)  IV 1 g (07/15/22 1141)     LOS: 5 days    Time spent: 59min    Domenic Polite, MD [facility]   07/16/2022, 11:11 AM

## 2022-07-17 DIAGNOSIS — J9601 Acute respiratory failure with hypoxia: Secondary | ICD-10-CM | POA: Diagnosis not present

## 2022-07-17 DIAGNOSIS — I5043 Acute on chronic combined systolic (congestive) and diastolic (congestive) heart failure: Secondary | ICD-10-CM | POA: Diagnosis not present

## 2022-07-17 DIAGNOSIS — E440 Moderate protein-calorie malnutrition: Secondary | ICD-10-CM | POA: Diagnosis not present

## 2022-07-17 LAB — CBC
HCT: 26.5 % — ABNORMAL LOW (ref 39.0–52.0)
Hemoglobin: 8.6 g/dL — ABNORMAL LOW (ref 13.0–17.0)
MCH: 30.8 pg (ref 26.0–34.0)
MCHC: 32.5 g/dL (ref 30.0–36.0)
MCV: 95 fL (ref 80.0–100.0)
Platelets: 177 10*3/uL (ref 150–400)
RBC: 2.79 MIL/uL — ABNORMAL LOW (ref 4.22–5.81)
RDW: 16.9 % — ABNORMAL HIGH (ref 11.5–15.5)
WBC: 8.5 10*3/uL (ref 4.0–10.5)
nRBC: 0 % (ref 0.0–0.2)

## 2022-07-17 LAB — BASIC METABOLIC PANEL
Anion gap: 11 (ref 5–15)
BUN: 84 mg/dL — ABNORMAL HIGH (ref 8–23)
CO2: 21 mmol/L — ABNORMAL LOW (ref 22–32)
Calcium: 8.8 mg/dL — ABNORMAL LOW (ref 8.9–10.3)
Chloride: 104 mmol/L (ref 98–111)
Creatinine, Ser: 3.66 mg/dL — ABNORMAL HIGH (ref 0.61–1.24)
GFR, Estimated: 16 mL/min — ABNORMAL LOW (ref >60)
Glucose, Bld: 106 mg/dL — ABNORMAL HIGH (ref 70–99)
Potassium: 3.8 mmol/L (ref 3.5–5.1)
Sodium: 136 mmol/L (ref 135–145)

## 2022-07-17 LAB — GLUCOSE, CAPILLARY
Glucose-Capillary: 134 mg/dL — ABNORMAL HIGH (ref 70–99)
Glucose-Capillary: 126 mg/dL — ABNORMAL HIGH (ref 70–99)
Glucose-Capillary: 129 mg/dL — ABNORMAL HIGH (ref 70–99)
Glucose-Capillary: 114 mg/dL — ABNORMAL HIGH (ref 70–99)

## 2022-07-17 LAB — PHOSPHORUS: Phosphorus: 5.6 mg/dL — ABNORMAL HIGH (ref 2.5–4.6)

## 2022-07-17 NOTE — Progress Notes (Signed)
[facility] Progress Note    Assessment/ Plan:   AKI on CKD4: Likely secondary to diuresis+urinary retention. Follows w/ Dr. 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 Advised patient to hydrate with water (which he has not been doing as per wife) -Continue to monitor daily Cr, Dose meds for GFR<15 -Maintain MAP>65 for optimal renal perfusion.  -Avoid nephrotoxic medications including NSAIDs and iodinated intravenous contrast exposure unless the latter is absolutely indicated.  Preferred narcotic agents for pain control are hydromorphone, fentanyl, and methadone. Morphine should not be used. Avoid Baclofen and avoid oral sodium phosphate and magnesium citrate based laxatives / bowel preps. Continue strict Input and Output monitoring. Will monitor the patient closely with you and intervene or adjust therapy as indicated by changes in clinical status/labs    AHRF secondary to acute on chronic COPD and acute on chronic HFrEF -s/p diuresis which is currently on hold -cardio following. COPD management per primary service -extubated 7/19   Hypertension: -BP acceptable   Anemia due to chronic disease: -Transfuse for Hgb<7 g/dL. Iron replete on panel. hgb stable today, ESA subq 7/22   PAF -on amio, eliquis  Urinary retention -s/p foley 7/20, will likely need urology referral on d/c    Subjective:   No acute events overnight. Wife at bedside. She reports that he really has not been drinking any water, drinking sodas and juices mainly. Otherwise no complaints. He remains eager to go home.   Objective:   BP (!) 163/66 (BP Location: Right Arm)   Pulse 66   Temp 98.4 F (36.9 C) (Oral)   Resp 19   Ht 5\' 9"  (1.753 m)   Wt 83.6 kg   SpO2 99%   BMI 27.22 kg/m   Intake/Output Summary (Last 24 hours) at 07/17/2022 0819 Last data filed at 07/17/2022  0000 Gross per 24 hour  Intake --  Output 900 ml  Net -900 ml   Weight change:   Physical Exam: Gen:NAD, sitting up in bed eating breakfast CVS: RRR Resp:normal wob, no w/r/r/c, poor air exchange bilaterally EXB:MWUX, NT Ext: no sig edema b/l LEs Neuro: awake, alert  Imaging: No results found.  Labs: BMET Recent Labs  Lab 07/12/22 1326 07/12/22 1621 07/12/22 2102 07/13/22 3244 07/14/22 0603 07/15/22 0726 07/16/22 0042 07/17/22 0036  NA  --  141 140 140 141 137 133* 136  K  --  3.8 3.9 4.0 4.2 3.8 3.6 3.8  CL  --  106 104 106 102 101 100 104  CO2  --  21* 20* 22 22 22  20* 21*  GLUCOSE  --  154* 160* 152* 115* 104* 107* 106*  BUN  --  38* 38* 47* 65* 77* 81* 84*  CREATININE  --  3.31* 3.23* 3.54* 3.75* 3.81* 3.70* 3.66*  CALCIUM  --  9.7 9.9 9.4 9.4 9.2 8.8* 8.8*  PHOS 2.2* 2.7  --  3.7 4.9* 5.3* 5.5* 5.6*   CBC Recent Labs  Lab 07/11/22 1352 07/11/22 1435 07/13/22 0613 07/14/22 0603 07/16/22 0042 07/17/22 0036  WBC 13.4*   < > 10.7* 12.1* 8.1 8.5  NEUTROABS 9.8*  --   --   --   --   --   HGB 10.8*   < > 9.7* 9.9* 8.6* 8.6*  HCT 35.0*   < > 29.3* 30.2* 27.0* 26.5*  MCV 102.0*   < > 93.6 95.6 96.1 95.0  PLT  352   < > 247 262 189 177   < > = values in this interval not displayed.    Medications:     allopurinol  100 mg Oral Daily   amiodarone  400 mg Oral BID   apixaban  5 mg Oral BID   arformoterol  15 mcg Nebulization BID   atorvastatin  40 mg Oral Daily   budesonide (PULMICORT) nebulizer solution  0.5 mg Nebulization BID   carvedilol  25 mg Oral BID   Chlorhexidine Gluconate Cloth  6 each Topical Daily   clopidogrel  75 mg Oral Daily   darbepoetin (ARANESP) injection - NON-DIALYSIS  100 mcg Subcutaneous Q Sat-1800   doxazosin  1 mg Oral Daily   insulin aspart  0-9 Units Subcutaneous TID WC   loratadine  10 mg Oral Daily   polyethylene glycol  17 g Oral Daily   revefenacin  175 mcg Nebulization Daily      Gean Quint, MD [facility] 07/17/2022, 8:19 AM

## 2022-07-17 NOTE — Progress Notes (Signed)
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  In the ED he was intubated and admitted to the ICU, creatinine was 3.0 on admission, he was admitted to the ICU for respiratory failure secondary to pulmonary edema and COPD exacerbation. -Extubated 7/19, hospital course complicated by AKI on CKD 4 -Transferred from ICU to [facility] service 7/21   Subjective: -Feels okay, no complaints, wants to go home, breathing is fair  Assessment and Plan:  Acute hypoxic and hypercarbic respiratory failure Required mechanical ventilation, extubated 7/19 -Diuresed with IV Lasix and treated for COPD exacerbation -Continue to wean O2 down to 2 L, diuretics on hold now  Acute on chronic systolic CHF Complicated by AKI on CKD 4 -Cards and nephrology following, diuresed with IV Lasix initially, diuretics on hold now, creatinine plateauing, 3.6 today -Echo with EF of 40-45% with regional wall motion abnormalities, plan for medical management given renal insufficiency and poor functional status -Continue Coreg, aspirin, statin -GDMT limited by CKD  NSTEMI CAD/CABG -NSTEMI treated medically, echo with wall motion abnormalities -Continue aspirin, Coreg, statin, treated with IV heparin X 48 hours, now back on Eliquis, also started on Plavix, aspirin discontinued  AKI on CKD 4 Urinary retention/suspected BPH -Baseline creatinine around 2.4-2.8, and on admission creatinine> 3, now 3.8, creatinine plateauing -Continue Foley catheter, started on Flomax-Will need Foley catheter at discharge -Defer diuretics to nephrology  COPD exacerbation -Treated with ceftriaxone, bronchodilators -Discontinue  ABX after 5 days  Paroxysmal atrial fibrillation -Atrial tachycardia -Appreciate EP input, continue Coreg and changed amiodarone to po -Continue Eliquis  DVT prophylaxis: Eliquis Code Status: DNR Family Communication: Updated wife at bedside Disposition Plan: Home pending improvement in kidney function  Consultants: Cards, nephrology   Procedures:   Antimicrobials:    Objective: Vitals:   07/17/22 0400 07/17/22 0759 07/17/22 0800 07/17/22 0802  BP: (!) 127/52 (!) 163/66    Pulse: 62 66    Resp: 19 19    Temp: 98.4 F (36.9 C)     TempSrc: Oral     SpO2: 100% 96% 99% 99%  Weight:      Height:        Intake/Output Summary (Last 24 hours) at 07/17/2022 1123 Last data filed at 07/17/2022 0000 Gross per 24 hour  Intake --  Output 900 ml  Net -900 ml   Filed Weights   07/14/22 2032 07/15/22 0632 07/16/22 0311  Weight: 87.5 kg 83.3 kg 83.6 kg    Examination:  General exam: Chronically ill male sitting up in bed, AAO x2, no distress HEENT: No JVD CVS: S1-S2, irregularly irregular rhythm Lungs: Poor air movement bilaterally, scant basilar Rales  Abdomen: Soft, nontender, bowel sounds present Extremities: Trace edema  Psychiatry:  Mood & affect appropriate.     Data Reviewed:   CBC: Recent Labs  Lab 07/11/22 1352 07/11/22 1435 07/12/22 0121 07/12/22 1033 07/13/22 5809 07/14/22 0603 07/16/22 0042 07/17/22 0036  WBC 13.4*  --  7.6  --  10.7* 12.1* 8.1 8.5  NEUTROABS 9.8*  --   --   --   --   --   --   --   HGB 10.8*   < > 9.8* 10.5*  9.7* 9.9* 8.6* 8.6*  HCT 35.0*   < > 30.3* 31.0* 29.3* 30.2* 27.0* 26.5*  MCV 102.0*  --  94.7  --  93.6 95.6 96.1 95.0  PLT 352  --  222  --  247 262 189 177   < > = values in this interval not displayed.   Basic Metabolic Panel: Recent Labs  Lab 07/12/22 1326 07/12/22 1621 07/12/22 2102 07/13/22 0272 07/14/22 0603 07/15/22 0726 07/16/22 0042 07/17/22 0036  NA  --  141   < > 140 141 137 133* 136  K  --  3.8   <  > 4.0 4.2 3.8 3.6 3.8  CL  --  106   < > 106 102 101 100 104  CO2  --  21*   < > 22 22 22  20* 21*  GLUCOSE  --  154*   < > 152* 115* 104* 107* 106*  BUN  --  38*   < > 47* 65* 77* 81* 84*  CREATININE  --  3.31*   < > 3.54* 3.75* 3.81* 3.70* 3.66*  CALCIUM  --  9.7   < > 9.4 9.4 9.2 8.8* 8.8*  MG 2.5* 2.4  --  2.4 2.6* 2.3  --   --   PHOS 2.2* 2.7  --  3.7 4.9* 5.3* 5.5* 5.6*   < > = values in this interval not displayed.   GFR: Estimated Creatinine Clearance: 16.9 mL/min (A) (by C-G formula based on SCr of 3.66 mg/dL (H)). Liver Function Tests: Recent Labs  Lab 07/11/22 1352 07/13/22 0613  AST 27 36  ALT 20 16  ALKPHOS 150* 104  BILITOT 0.8 0.6  PROT 7.3 6.3*  ALBUMIN 4.0 3.4*   No results for input(s): "LIPASE", "AMYLASE" in the last 168 hours. No results for input(s): "AMMONIA" in the last 168 hours. Coagulation Profile: Recent Labs  Lab 07/11/22 1352  INR 1.7*   Cardiac Enzymes: No results for input(s): "CKTOTAL", "CKMB", "CKMBINDEX", "TROPONINI" in the last 168 hours. BNP (last 3 results) No results for input(s): "PROBNP" in the last 8760 hours. HbA1C: No results for input(s): "HGBA1C" in the last 72 hours. CBG: Recent Labs  Lab 07/16/22 0812 07/16/22 1107 07/16/22 1518 07/16/22 2124 07/17/22 0725  GLUCAP 154* 107* 137* 123* 134*   Lipid Profile: Recent Labs    07/15/22 0726  TRIG 124   Thyroid Function Tests: No results for input(s): "TSH", "T4TOTAL", "FREET4", "T3FREE", "THYROIDAB" in the last 72 hours.  Anemia Panel: No results for input(s): "VITAMINB12", "FOLATE", "FERRITIN", "TIBC", "IRON", "RETICCTPCT" in the last 72 hours.  Urine analysis:    Component Value Date/Time   COLORURINE AMBER (A) 07/13/2022 1900   APPEARANCEUR CLOUDY (A) 07/13/2022 1900   LABSPEC 1.017 07/13/2022 1900   PHURINE 5.0 07/13/2022 1900   GLUCOSEU 150 (A) 07/13/2022 1900   HGBUR MODERATE (A) 07/13/2022 1900   BILIRUBINUR NEGATIVE 07/13/2022 1900   KETONESUR  NEGATIVE 07/13/2022 1900   PROTEINUR 100 (A) 07/13/2022 1900   NITRITE NEGATIVE 07/13/2022 1900   LEUKOCYTESUR NEGATIVE 07/13/2022 1900   Sepsis Labs: @LABRCNTIP (procalcitonin:4,lacticidven:4)  ) Recent Results (from the past 240 hour(s))  Blood culture (routine x 2)     Status: None   Collection Time: 07/11/22  2:50 PM   Specimen: BLOOD LEFT ARM  Result Value Ref Range Status   Specimen Description BLOOD LEFT ARM  Final   Special Requests   Final    BOTTLES DRAWN AEROBIC AND ANAEROBIC Blood Culture results may not  be optimal due to an inadequate volume of blood received in culture bottles   Culture   Final    NO GROWTH 5 DAYS Performed at [facility], [place] [address], [place], [place] [address]    Report Status 07/16/2022 FINAL  Final  Blood culture (routine x 2)     Status: Abnormal   Collection Time: 07/11/22  3:00 PM   Specimen: BLOOD  Result Value Ref Range Status   Specimen Description BLOOD LEFT ANTECUBITAL  Final   Special Requests   Final    BOTTLES DRAWN AEROBIC AND ANAEROBIC Blood Culture adequate volume   Culture  Setup Time   Final    GRAM POSITIVE RODS ANAEROBIC BOTTLE ONLY CRITICAL RESULT CALLED TO, READ BACK BY AND VERIFIED WITH: PHARMD JAMES LEDFORD 07/12/22@4 :50 BY TW    Culture (A)  Final    BACILLUS SPECIES Standardized susceptibility testing for this organism is not available. Performed at [facility], [place] [address] [place], [place] [address]    Report Status 07/13/2022 FINAL  Final  MRSA Next Gen by PCR, Nasal     Status: None   Collection Time: 07/11/22  5:36 PM   Specimen: Nasal Mucosa; Nasal Swab  Result Value Ref Range Status   MRSA by PCR Next Gen NOT DETECTED NOT DETECTED Final    Comment: (NOTE) The GeneXpert MRSA Assay (FDA approved for NASAL specimens only), is one component of a comprehensive MRSA colonization surveillance program. It is not intended to diagnose MRSA infection nor to guide or monitor treatment for MRSA  infections. Test performance is not FDA approved in patients less than 3 years old. Performed at [facility], [place] [address] [place], [place] [address]   Culture, Respiratory w Gram Stain     Status: None   Collection Time: 07/11/22  5:40 PM   Specimen: Tracheal Aspirate; Respiratory  Result Value Ref Range Status   Specimen Description TRACHEAL ASPIRATE  Final   Special Requests Normal  Final   Gram Stain   Final    RARE WBC PRESENT, PREDOMINANTLY PMN FEW GRAM POSITIVE COCCI RARE GRAM VARIABLE ROD    Culture   Final    Normal respiratory flora-no Staph aureus or Pseudomonas seen Performed at [facility], [address] [place], [place] [address]    Report Status 07/15/2022 FINAL  Final  SARS Coronavirus 2 by RT PCR (hospital order, performed in [facility] hospital lab) *cepheid single result test* Anterior Nasal Swab     Status: None   Collection Time: 07/12/22 11:42 AM   Specimen: Anterior Nasal Swab  Result Value Ref Range Status   SARS Coronavirus 2 by RT PCR NEGATIVE NEGATIVE Final    Comment: (NOTE) SARS-CoV-2 target nucleic acids are NOT DETECTED.  The SARS-CoV-2 RNA is generally detectable in upper and lower respiratory specimens during the acute phase of infection. The lowest concentration of SARS-CoV-2 viral copies this assay can detect is 250 copies / mL. A negative result does not preclude SARS-CoV-2 infection and should not be used as the sole basis for treatment or other patient management decisions.  A negative result may occur with improper specimen collection / handling, submission of specimen other than nasopharyngeal swab, presence of viral mutation(s) within the areas targeted by this assay, and inadequate number of viral copies (<250 copies / mL). A negative result must be combined with clinical observations, patient history, and epidemiological information.  Fact Sheet for Patients:   https://www.patel.info/  Fact  Sheet for Healthcare Providers: https://hall.com/  This test is not yet approved or  cleared by the [place] FDA and has been authorized for detection and/or diagnosis of SARS-CoV-2 by FDA under an Emergency Use Authorization (EUA).  This EUA will remain in effect (meaning this test can be used) for the duration of the COVID-19 declaration under Section 564(b)(1) of the Act, 21 U.S.C. section 360bbb-3(b)(1), unless the authorization is terminated or revoked sooner.  Performed at [facility], [place] [address], [place], [place] [address]      Radiology Studies: No results found.   Scheduled Meds:  allopurinol  100 mg Oral Daily   amiodarone  400 mg Oral BID   apixaban  5 mg Oral BID   arformoterol  15 mcg Nebulization BID   atorvastatin  40 mg Oral Daily   budesonide (PULMICORT) nebulizer solution  0.5 mg Nebulization BID   carvedilol  25 mg Oral BID   Chlorhexidine Gluconate Cloth  6 each Topical Daily   clopidogrel  75 mg Oral Daily   darbepoetin (ARANESP) injection - NON-DIALYSIS  100 mcg Subcutaneous Q Sat-1800   doxazosin  1 mg Oral Daily   insulin aspart  0-9 Units Subcutaneous TID WC   loratadine  10 mg Oral Daily   polyethylene glycol  17 g Oral Daily   revefenacin  175 mcg Nebulization Daily   Continuous Infusions:     LOS: 6 days    Time spent: 38min  Domenic Polite, MD [facility]   07/17/2022, 11:23 AM

## 2022-07-18 ENCOUNTER — Inpatient Hospital Stay (HOSPITAL_COMMUNITY): Payer: Medicare HMO

## 2022-07-18 DIAGNOSIS — R778 Other specified abnormalities of plasma proteins: Secondary | ICD-10-CM

## 2022-07-18 DIAGNOSIS — N179 Acute kidney failure, unspecified: Secondary | ICD-10-CM | POA: Diagnosis not present

## 2022-07-18 DIAGNOSIS — I5043 Acute on chronic combined systolic (congestive) and diastolic (congestive) heart failure: Secondary | ICD-10-CM | POA: Diagnosis not present

## 2022-07-18 DIAGNOSIS — I5023 Acute on chronic systolic (congestive) heart failure: Secondary | ICD-10-CM | POA: Diagnosis not present

## 2022-07-18 DIAGNOSIS — J441 Chronic obstructive pulmonary disease with (acute) exacerbation: Secondary | ICD-10-CM | POA: Diagnosis not present

## 2022-07-18 DIAGNOSIS — J9601 Acute respiratory failure with hypoxia: Secondary | ICD-10-CM | POA: Diagnosis not present

## 2022-07-18 DIAGNOSIS — Z7189 Other specified counseling: Secondary | ICD-10-CM

## 2022-07-18 DIAGNOSIS — E440 Moderate protein-calorie malnutrition: Secondary | ICD-10-CM | POA: Diagnosis not present

## 2022-07-18 DIAGNOSIS — I10 Essential (primary) hypertension: Secondary | ICD-10-CM | POA: Diagnosis not present

## 2022-07-18 LAB — CBC
HCT: 25.6 % — ABNORMAL LOW (ref 39.0–52.0)
Hemoglobin: 8.2 g/dL — ABNORMAL LOW (ref 13.0–17.0)
MCH: 30.8 pg (ref 26.0–34.0)
MCHC: 32 g/dL (ref 30.0–36.0)
MCV: 96.2 fL (ref 80.0–100.0)
Platelets: 187 10*3/uL (ref 150–400)
RBC: 2.66 MIL/uL — ABNORMAL LOW (ref 4.22–5.81)
RDW: 17 % — ABNORMAL HIGH (ref 11.5–15.5)
WBC: 9.5 10*3/uL (ref 4.0–10.5)
nRBC: 0 % (ref 0.0–0.2)

## 2022-07-18 LAB — BLOOD GAS, ARTERIAL
Acid-base deficit: 4.4 mmol/L — ABNORMAL HIGH (ref 0.0–2.0)
Bicarbonate: 20.2 mmol/L (ref 20.0–28.0)
Drawn by: 270221
O2 Saturation: 98.9 %
Patient temperature: 36.8
pCO2 arterial: 35 mmHg (ref 32–48)
pH, Arterial: 7.37 (ref 7.35–7.45)
pO2, Arterial: 78 mmHg — ABNORMAL LOW (ref 83–108)

## 2022-07-18 LAB — GLUCOSE, CAPILLARY
Glucose-Capillary: 107 mg/dL — ABNORMAL HIGH (ref 70–99)
Glucose-Capillary: 135 mg/dL — ABNORMAL HIGH (ref 70–99)
Glucose-Capillary: 122 mg/dL — ABNORMAL HIGH (ref 70–99)
Glucose-Capillary: 108 mg/dL — ABNORMAL HIGH (ref 70–99)

## 2022-07-18 LAB — BASIC METABOLIC PANEL
Anion gap: 9 (ref 5–15)
BUN: 89 mg/dL — ABNORMAL HIGH (ref 8–23)
CO2: 22 mmol/L (ref 22–32)
Calcium: 8.9 mg/dL (ref 8.9–10.3)
Chloride: 107 mmol/L (ref 98–111)
Creatinine, Ser: 3.91 mg/dL — ABNORMAL HIGH (ref 0.61–1.24)
GFR, Estimated: 15 mL/min — ABNORMAL LOW (ref >60)
Glucose, Bld: 110 mg/dL — ABNORMAL HIGH (ref 70–99)
Potassium: 4 mmol/L (ref 3.5–5.1)
Sodium: 138 mmol/L (ref 135–145)

## 2022-07-18 MED ORDER — ISOSORB DINITRATE-HYDRALAZINE 20-37.5 MG PO TABS: 0.5000 | ORAL_TABLET | Freq: Two times a day (BID) | ORAL | Status: DC

## 2022-07-18 MED ORDER — OXYCODONE HCL 5 MG/5ML PO SOLN
2.5000 mg | ORAL | Status: DC | PRN
  Administered 2022-07-19: 2.5 mg via ORAL
  Filled 2022-07-18: qty 5

## 2022-07-18 MED ORDER — FUROSEMIDE 10 MG/ML IJ SOLN
80.0000 mg | Freq: Once | INTRAMUSCULAR | Status: AC
  Administered 2022-07-18: 80 mg via INTRAVENOUS
  Filled 2022-07-18: qty 8

## 2022-07-18 MED ORDER — WHITE PETROLATUM EX OINT
TOPICAL_OINTMENT | CUTANEOUS | Status: DC | PRN
  Filled 2022-07-18: qty 28.35

## 2022-07-18 MED ORDER — BISACODYL 10 MG RE SUPP: 10.0000 mg | Freq: Once | RECTAL | Status: DC

## 2022-07-18 MED ORDER — TRAZODONE HCL 50 MG PO TABS
25.0000 mg | ORAL_TABLET | Freq: Every evening | ORAL | Status: DC | PRN
Start: 2022-07-18 — End: 2022-07-20

## 2022-07-18 MED ORDER — DOXAZOSIN MESYLATE 4 MG PO TABS
2.0000 mg | ORAL_TABLET | Freq: Every day | ORAL | Status: DC
  Administered 2022-07-18 – 2022-07-20 (×3): 2 mg via ORAL
  Filled 2022-07-18 (×3): qty 1

## 2022-07-18 MED ORDER — POLYETHYLENE GLYCOL 3350 17 G PO PACK
17.0000 g | PACK | Freq: Two times a day (BID) | ORAL | Status: DC
Start: 2022-07-18 — End: 2022-07-20
  Administered 2022-07-18: 17 g via ORAL
  Filled 2022-07-18 (×5): qty 1

## 2022-07-18 MED ORDER — SENNOSIDES-DOCUSATE SODIUM 8.6-50 MG PO TABS
2.0000 | ORAL_TABLET | Freq: Two times a day (BID) | ORAL | Status: DC
  Administered 2022-07-18 (×2): 2 via ORAL
  Filled 2022-07-18 (×5): qty 2

## 2022-07-18 MED ORDER — HYDRALAZINE HCL 20 MG/ML IJ SOLN: 10.0000 mg | Freq: Four times a day (QID) | INTRAMUSCULAR | Status: DC | PRN

## 2022-07-18 MED ORDER — ISOSORB DINITRATE-HYDRALAZINE 20-37.5 MG PO TABS
0.5000 | ORAL_TABLET | Freq: Two times a day (BID) | ORAL | Status: DC
  Administered 2022-07-18 – 2022-07-20 (×4): 0.5 via ORAL
  Filled 2022-07-18 (×4): qty 1

## 2022-07-18 NOTE — Progress Notes (Signed)
Patient had an eventful day today. This a.m. patient was having respiratory distress with accessory muscle usage and calling out several times via call light reporting, "I can't breath."  MD contacted. CXR done. Lasix ordered. Patient only had 350 out with 1 dose IV Lasix 80 mg.  Also in the morning, patient had a large BM that was noted to be black/brown in color with a maroon streak of blood in it. Prior patient had been constipated, 5 days without BM so could have been from straining. MD notified. No new orders, monitoring only.  Later this afternoon, it was noted by family that left arm and hand were larger than right and more swollen. It was also noted that stomach looked more distended throughout the day. It does appear that both were accurate after assessment and MD was paged.  No new orders and to monitor only for now.  Patient and family did meet with palliative care today and it was decided that patient will be going home with hospice. Hospice needs are being set up by transition of care team.

## 2022-07-18 NOTE — Progress Notes (Signed)
   This pt was referred to hospice care with [facility]. I have reviewed the chart and discussed the pt with our medical provider. She does approve the pt for hospice care at home.   He has equipment in home of oxygen, shower chair, BSC and w/c. I will order a walker and have oxygen switched to our contracted provider. This should not delay d/c by any means. I did offer a hospital bed and this was declined at this time.   I did have discussion with the wife [name] about the pt's active ICD and as he transitions to end of life care we would hate for this to shock him and she did confirm he was a DNR. We discussed deactivating this and she was in agreement. We would like to have this done prior to d/c if possible. I have messaged attending and NP Kasie regarding this conversation as well.   Thank you for this referral and the opportunity to work with this family. Webb Silversmith RN [phone]

## 2022-07-18 NOTE — Consult Note (Signed)
Consultation Note Date: 07/18/2022   [name]  DOB: December 22, 1946  MRN: [id number]  Age / Sex: 76 y.o., male  PCP: Charlaine Dalton, MD Referring Physician: Domenic Polite, MD  Reason for Consultation: Goals of care  HPI/Patient Profile: 76 y.o. male  with past medical history of COPD with chronic respiratory failure on home oxygen, paroxysmal A-fib, CAD status post CABG x4 in 2014, CHF, CKD stage IV, dementia, admitted on 07/11/2022 with heart failure exacerbation in the setting of advanced end-stage COPD and CKD.  Admitted for diuresis.  His creatinine is trending up.  Per nephrology he is not a candidate for hemodialysis.  Palliative medicine consulted for goals of care.  Primary Decision Maker NEXT OF KIN -spouse Honorio Devol  Discussion: I have reviewed medical records including Care Everywhere, progress notes from this and prior admissions, labs and imaging.  On evaluation patient is awake and alert.  He is visibly short of breath.  I met with the patient and with his spouse [name] at the bedside.  I introduced Palliative Medicine as specialized medical care for people living with serious illness. It focuses on providing relief from the symptoms and stress of a serious illness. The goal is to improve quality of life for both the patient and the family.  We discussed a brief life review of the patient.  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  He and his spouse have been married for 8 years.  They both have children but not together.  They met while working at [employer].  As far as functional and nutritional status -prior to this admission his functional status has been significantly decreased.  He is able to ambulate from bed to chair to the bathroom and that is all he gets very short of breath.  He has had declining appetite.   We discussed patient's  current illness and what it means in the larger context of patient's on-going co-morbidities.  Natural disease trajectory and expectations at EOL were discussed.  He and his wife shared that they have seen great decline over the past few months and believe patient is nearing end-of-life.  Their primary hopes are for him to be at home and not to suffer as he approaches end-of-life.  Hospice services outpatient were explained and offered.  We discussed the philosophy of hospice care and the services that hospice provides.  Options for transition to full comfort measures only versus continued medical stabilization this hospitalization and then discharged home with hospice was discussed.  At this time they wish to maximize medical interventions during this hospitalization and then discharge patient home with hospice.  We discussed symptom management.  Recommend low-dose oxycodone liquid for shortness of breath.  Patient had not had a bowel movement in several weeks before his admission.  He has had 1 this admission yesterday.  He has been started on a stool softener recommend continuing on stool softener. He does have some difficulty sleeping.  He has difficulty falling asleep and  staying asleep.  Recommend as needed trazodone.  Questions and concerns were addressed. The family was encouraged to call with questions or concerns.     SUMMARY OF RECOMMENDATIONS -TOC consult for hospice services at home-family request [facility] -Oxycodone liquid 5 mg per mill-2.5 mg p.o. every 2 hours as needed for shortness of breath -Trazodone 25 mg nightly as needed for sleep -Continue senna stool softener  Code Status/Advance Care Planning: DNR   Prognosis:   < 6 months  Discharge Planning: Home with Hospice  Primary Diagnoses: Present on Admission: **None**   Review of Systems  Constitutional:  Positive for activity change and appetite change.  Respiratory:  Positive for shortness of  breath.     Physical Exam Vitals and nursing note reviewed.  Cardiovascular:     Rate and Rhythm: Normal rate.  Pulmonary:     Comments: Increased rate and effort    Vital Signs: BP (!) 177/79 (BP Location: Right Arm)   Pulse 70   Temp 98.3 F (36.8 C) (Oral)   Resp (!) 23   Ht '5\' 9"'  (1.753 m)   Wt 84 kg   SpO2 98%   BMI 27.35 kg/m  Pain Scale: 0-10 POSS *See Group Information*: S-Acceptable,Sleep, easy to arouse Pain Score: 0-No pain   SpO2: SpO2: 98 % O2 Device:SpO2: 98 % O2 Flow Rate: .O2 Flow Rate (L/min): 5 L/min  IO: Intake/output summary:  Intake/Output Summary (Last 24 hours) at 07/18/2022 1426 Last data filed at 07/18/2022 0437 Gross per 24 hour  Intake 240 ml  Output 350 ml  Net -110 ml    LBM: Last BM Date : 07/18/22 Baseline Weight: Weight: 81.8 kg Most recent weight: Weight: 84 kg       Thank you for this consult. Palliative medicine will continue to follow and assist as needed.   Greater than 50%  of this time was spent counseling and coordinating care related to the above assessment and plan.  Signed by: Mariana Kaufman, AGNP-C Palliative Medicine    Please contact Palliative Medicine Team phone at [phone] for questions and concerns.  For individual provider: See Shea Evans

## 2022-07-18 NOTE — Progress Notes (Signed)
Progress Note  [name] Date of Encounter: 07/18/2022  [facility] Cardiologist: Donato Heinz, MD   Subjective  Denies any chest pain or shortness of breath  Inpatient Medications    Scheduled Meds:  allopurinol  100 mg Oral Daily   amiodarone  400 mg Oral BID   apixaban  5 mg Oral BID   arformoterol  15 mcg Nebulization BID   atorvastatin  40 mg Oral Daily   bisacodyl  10 mg Rectal Once   budesonide (PULMICORT) nebulizer solution  0.5 mg Nebulization BID   carvedilol  25 mg Oral BID   Chlorhexidine Gluconate Cloth  6 each Topical Daily   clopidogrel  75 mg Oral Daily   darbepoetin (ARANESP) injection - NON-DIALYSIS  100 mcg Subcutaneous Q Sat-1800   doxazosin  2 mg Oral Daily   insulin aspart  0-9 Units Subcutaneous TID WC   loratadine  10 mg Oral Daily   polyethylene glycol  17 g Oral BID   revefenacin  175 mcg Nebulization Daily   senna-docusate  2 tablet Oral BID   Continuous Infusions:   PRN Meds: acetaminophen, albuterol, guaiFENesin, hydrALAZINE, ondansetron (ZOFRAN) IV, mouth rinse   Vital Signs    Vitals:   07/18/22 0830 07/18/22 0840 07/18/22 0854 07/18/22 0900  BP: (!) 177/79     Pulse: 66 63  70  Resp: (!) 22 19  (!) 23  Temp:      TempSrc:      SpO2: 100% 100% 100% 98%  Weight:      Height:        Intake/Output Summary (Last 24 hours) at 07/18/2022 0956 Last data filed at 07/18/2022 6378 Gross per 24 hour  Intake 240 ml  Output 350 ml  Net -110 ml       07/18/2022    4:33 AM 07/16/2022    3:11 AM 07/15/2022    6:32 AM  Last 3 Weights  Weight (lbs) 185 lb 3 oz 184 lb 4.9 oz 183 lb 10.3 oz  Weight (kg) 84 kg 83.6 kg 83.3 kg      Telemetry    Atrial paced V sensed personally Reviewed  ECG    No new EKG to review- Personally Reviewed  Physical Exam   GEN: Well nourished, well developed in no acute distress HEENT: Normal NECK: No JVD; No carotid bruits LYMPHATICS: No lymphadenopathy CARDIAC:RRR, no  murmurs, rubs, gallops RESPIRATORY:  Clear to auscultation without rales, wheezing or rhonchi  ABDOMEN: Soft, non-tender, no which she will who over the guy-distended MUSCULOSKELETAL:  No edema; No deformity  SKIN: Warm and dry NEUROLOGIC:  Alert and oriented x 3 PSYCHIATRIC:  Normal affect   Labs    High Sensitivity Troponin:   Recent Labs  Lab 07/11/22 1352 07/11/22 2259 07/12/22 0121 07/12/22 0618  TROPONINIHS 21* 833* 990* 1,166*      Chemistry Recent Labs  Lab 07/11/22 1352 07/11/22 1435 07/13/22 5885 07/14/22 0603 07/15/22 0726 07/16/22 0042 07/17/22 0036 07/18/22 0047  NA 137   < > 140 141 137 133* 136 138  K 4.3   < > 4.0 4.2 3.8 3.6 3.8 4.0  CL 101   < > 106 102 101 100 104 107  CO2 22   < > 22 22 22  20* 21* 22  GLUCOSE 146*   < > 152* 115* 104* 107* 106* 110*  BUN 28*   < > 47* 65* 77* 81* 84* 89*  CREATININE 3.00*   < >  3.54* 3.75* 3.81* 3.70* 3.66* 3.91*  CALCIUM 9.7   < > 9.4 9.4 9.2 8.8* 8.8* 8.9  MG 2.3   < > 2.4 2.6* 2.3  --   --   --   PROT 7.3  --  6.3*  --   --   --   --   --   ALBUMIN 4.0  --  3.4*  --   --   --   --   --   AST 27  --  36  --   --   --   --   --   ALT 20  --  16  --   --   --   --   --   ALKPHOS 150*  --  104  --   --   --   --   --   BILITOT 0.8  --  0.6  --   --   --   --   --   GFRNONAA 21*   < > 17* 16* 16* 16* 16* 15*  ANIONGAP 14   < > 12 17* 14 13 11 9    < > = values in this interval not displayed.     Lipids  Recent Labs  Lab 07/15/22 0726  TRIG 124     Hematology Recent Labs  Lab 07/16/22 0042 07/17/22 0036 07/18/22 0047  WBC 8.1 8.5 9.5  RBC 2.81* 2.79* 2.66*  HGB 8.6* 8.6* 8.2*  HCT 27.0* 26.5* 25.6*  MCV 96.1 95.0 96.2  MCH 30.6 30.8 30.8  MCHC 31.9 32.5 32.0  RDW 16.5* 16.9* 17.0*  PLT 189 177 187    Thyroid  Recent Labs  Lab 07/12/22 1621  TSH 1.029     BNP Recent Labs  Lab 07/11/22 1352 07/13/22 0613  BNP 777.0* 315.4*     DDimer No results for input(s): "DDIMER" in the last  168 hours.   Radiology    DG CHEST PORT 1 VIEW  Result Date: 07/18/2022 CLINICAL DATA:  Shortness of breath respiratory distress. EXAM: PORTABLE CHEST 1 VIEW COMPARISON:  Chest x-ray 07/13/2022. FINDINGS: Increased peripheral predominant airspace opacities bilaterally. Suspected small left pleural effusion. No visible pneumothorax. Cardiomediastinal silhouette is unchanged. Median sternotomy. Left subclavian approach cardiac rhythm maintenance device. Polyarticular degenerative change. IMPRESSION: 1. Increased peripheral predominant interstitial opacities bilaterally, suspicious for interstitial edema or atypical/multifocal infection. Recommend follow-up to resolution. 2. Suspected small left pleural effusion. Electronically Signed   By: Margaretha Sheffield M.D.   On: 07/18/2022 09:51    Cardiac Studies   Echo as above  Patient Profile     76 y.o. 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  Assessment & Plan    Principal Problem:   Acute exacerbation of CHF (congestive heart failure) (HCC) Active Problems:   Malnutrition of moderate degree  1. Acute on chronic respiratory failure, with suspected AECOPD and acute on chronic HFrEF - unclear precipitant, has baseline severe comorbidities including HFrEF, CKD, and COPD - Echo unchanged from prior, EF mildly reduced. No new regionals compared to prior, in setting of NSTEMI.  - unclear if this is related to ischemia so will treat NSTEMI medically. Wife  notes he does not feel well at home and is slowing down, however this is likely multifactorial. We discussed possibility of outpatient [address]  spect nuclear study to risk stratify his troponin elevation. - Wife reports he has well controlled BP at home. - nephrology suspects diuresis related AKI and urinary retention.  - defer diuretics to expertise of nephrology regarding renal function. - Not a candidate for ACE/ARB/ARN I/Spyro due to AKI on CKD stage IV   2. Paroxysmal atrial fib/flutter/SVT, currently with narrow complex tachycardia - Continue carvedilol 25 mg twice daily, Eliquis 5 mg twice daily and amiodarone 400 mg twice daily x2 weeks then 200 mg twice daily for 2 weeks then 200 mg daily   3. AKI on CKD stage IV -Serum creatinine has bumped to 3.91 today  -baseline CR around 2.4-2.8 recently, nephrology on board, see #1  4. CAD s/p remote CABG 2014, NSTEMI 02/2022 treated medically, HLD, here with recurrent NSTEMI - unknown if this is a primary ACS with de novo CAD versus demand ischemia from multiple physiologic stressors - given continued renal insufficiency and poor baseline functional status recently, recommend medical therapy - Continue Plavix 75 mg daily, beta-blocker and statin therapy - Aspirin stopped due need for DOAC   5. Essential HTN - BP remains elevated  - Continue carvedilol 25 mg twice daily  - Given LV dysfunction consider addition of hydralazine but will defer to nephrology    6. Carotid artery disease, potential L subclavian stenosis - occluded RICA, 65-03% LICA with prior stent placement, stenotic L subclavian by duplex 10/2021 - Now on Plavix/DOAC as well as statin therapy  7. ICD in place  I have spent a total of 35 minutes with patient reviewing 2D echo, EP consult, telemetry, EKGs, labs and examining patient as well as establishing an assessment and plan that was discussed with the patient.  > 50% of time was spent in direct patient care.     [facility] will sign off.   Medication Recommendations: Amiodarone 400 mg twice daily x2 weeks then 200 mg twice daily for 2 weeks then 200 mg  daily, apixaban 5 mg twice daily, atorvastatin 40 mg daily, carvedilol 25 mg twice daily, Plavix 75 mg daily.  Diuretics and further blood pressure management per nephrology. Other recommendations (labs, testing, etc): None Follow up as an outpatient: Dr. Gardiner Rhyme or extender in 1 week   For questions or updates, please contact [place] Please consult [email or web address] for contact info under        Signed, Fransico Him, MD  07/18/2022, 9:56 AM

## 2022-07-18 NOTE — Care Management Important Message (Signed)
Important Message  Patient Details  Name: Joe Wiggins MRN: [id number] Date of Birth: [birth year]   Medicare Important Message Given:  Yes     Orbie Pyo 07/18/2022, 3:21 PM

## 2022-07-18 NOTE — Progress Notes (Addendum)
PROGRESS NOTE    Joe Wiggins  ONG:[id number] DOB: [birth year] DOA: 07/11/2022 PCP: Charlaine Dalton, MD  76/F with history of COPD, chronic respiratory failure on 2 L home O2, CAD/CABG, ischemic cardiomyopathy with EF of 40%, ICD, CKD stage IV, mild dementia, history of CVA, GERD, hypertension presented to the ED with progressive dyspnea on exertion for 3 to 4 weeks.  In the ED he was intubated and admitted to the ICU, creatinine was 3.0 on admission, he was admitted to the ICU for respiratory failure secondary to pulmonary edema and COPD exacerbation. -Extubated 7/19, hospital course complicated by AKI on CKD 4 -Transferred from ICU to [facility] service 7/21 -Diuretics held, nephrology following   Subjective: -Feels okay, no complaints, wants to go home, breathing is fair  Assessment and Plan:  Acute hypoxic and hypercarbic respiratory failure Required mechanical ventilation, extubated 7/19 -Diuresed with IV Lasix and treated for COPD exacerbation -wean O2 down to 2 L, diuretics then held -addendum: became acutely dyspneic late morning, lasix 80mg  x1 given  Acute on chronic systolic CHF Complicated by AKI on CKD 4 -Cards and nephrology following, diuresed with IV Lasix initially, diuretics on hold now, creatinine was plateauing now 3.9, discussed with nephrology, overall would be a poor dialysis candidate should his kidney function not improve, palliative consulted -Echo with EF of 40-45% with regional wall motion abnormalities, plan for medical management given renal insufficiency and poor functional status -Continue Coreg, aspirin, statin -GDMT limited by CKD -add low dose bidil, caution to avoid hypotension w/ AKI  NSTEMI CAD/CABG -NSTEMI treated medically, echo with wall motion abnormalities -Continue aspirin, Coreg, statin, treated with IV heparin X 48 hours, now back on Eliquis, also started on Plavix, aspirin discontinued  AKI on CKD 4 Urinary retention/suspected  BPH -Baseline creatinine around 2.4-2.8, and on admission creatinine> 3, now 3.8, creatinine plateauing -Continue Foley catheter, started on Flomax-Will need Foley catheter at discharge -Defer diuretics to nephrology  COPD exacerbation -Treated with ceftriaxone, bronchodilators -antibiotics discontinued  Paroxysmal atrial fibrillation -Atrial tachycardia -Appreciate EP input, continue Coreg and changed amiodarone to po -Continue Eliquis  DVT prophylaxis: Eliquis Code Status: DNR Family Communication: Updated wife at bedside Disposition Plan: Home pending improvement in kidney function  Consultants: Cards, nephrology   Procedures:   Antimicrobials:    Objective: Vitals:   07/18/22 0830 07/18/22 0840 07/18/22 0854 07/18/22 0900  BP: (!) 177/79     Pulse: 66 63  70  Resp: (!) 22 19  (!) 23  Temp:      TempSrc:      SpO2: 100% 100% 100% 98%  Weight:      Height:        Intake/Output Summary (Last 24 hours) at 07/18/2022 1358 Last data filed at 07/18/2022 0437 Gross per 24 hour  Intake 240 ml  Output 350 ml  Net -110 ml   Filed Weights   07/15/22 0632 07/16/22 0311 07/18/22 0433  Weight: 83.3 kg 83.6 kg 84 kg    Examination:  General exam: Chronically ill male sitting up in bed, AAO x2, no distress HEENT: Positive JVD CVS: S1-S2, irregularly irregular rhythm Lungs: Decreased breath sounds at the bases, trace basilar rales Abdomen: Soft, nontender, bowel sounds present Extremities: No edema  Psychiatry:  Mood & affect appropriate.     Data Reviewed:   CBC: Recent Labs  Lab 07/13/22 0613 07/14/22 0603 07/16/22 0042 07/17/22 0036 07/18/22 0047  WBC 10.7* 12.1* 8.1 8.5 9.5  HGB 9.7* 9.9* 8.6* 8.6* 8.2*  HCT  29.3* 30.2* 27.0* 26.5* 25.6*  MCV 93.6 95.6 96.1 95.0 96.2  PLT 247 262 189 177 329   Basic Metabolic Panel: Recent Labs  Lab 07/12/22 1326 07/12/22 1621 07/12/22 2102 07/13/22 5188 07/14/22 0603 07/15/22 0726 07/16/22 0042  07/17/22 0036 07/18/22 0047  NA  --  141   < > 140 141 137 133* 136 138  K  --  3.8   < > 4.0 4.2 3.8 3.6 3.8 4.0  CL  --  106   < > 106 102 101 100 104 107  CO2  --  21*   < > 22 22 22  20* 21* 22  GLUCOSE  --  154*   < > 152* 115* 104* 107* 106* 110*  BUN  --  38*   < > 47* 65* 77* 81* 84* 89*  CREATININE  --  3.31*   < > 3.54* 3.75* 3.81* 3.70* 3.66* 3.91*  CALCIUM  --  9.7   < > 9.4 9.4 9.2 8.8* 8.8* 8.9  MG 2.5* 2.4  --  2.4 2.6* 2.3  --   --   --   PHOS 2.2* 2.7  --  3.7 4.9* 5.3* 5.5* 5.6*  --    < > = values in this interval not displayed.   GFR: Estimated Creatinine Clearance: 15.8 mL/min (A) (by C-G formula based on SCr of 3.91 mg/dL (H)). Liver Function Tests: Recent Labs  Lab 07/13/22 0613  AST 36  ALT 16  ALKPHOS 104  BILITOT 0.6  PROT 6.3*  ALBUMIN 3.4*   No results for input(s): "LIPASE", "AMYLASE" in the last 168 hours. No results for input(s): "AMMONIA" in the last 168 hours. Coagulation Profile: No results for input(s): "INR", "PROTIME" in the last 168 hours.  Cardiac Enzymes: No results for input(s): "CKTOTAL", "CKMB", "CKMBINDEX", "TROPONINI" in the last 168 hours. BNP (last 3 results) No results for input(s): "PROBNP" in the last 8760 hours. HbA1C: No results for input(s): "HGBA1C" in the last 72 hours. CBG: Recent Labs  Lab 07/17/22 1138 07/17/22 1706 07/17/22 2122 07/18/22 0606 07/18/22 1151  GLUCAP 126* 129* 114* 107* 135*   Lipid Profile: No results for input(s): "CHOL", "HDL", "LDLCALC", "TRIG", "CHOLHDL", "LDLDIRECT" in the last 72 hours.  Thyroid Function Tests: No results for input(s): "TSH", "T4TOTAL", "FREET4", "T3FREE", "THYROIDAB" in the last 72 hours.  Anemia Panel: No results for input(s): "VITAMINB12", "FOLATE", "FERRITIN", "TIBC", "IRON", "RETICCTPCT" in the last 72 hours.  Urine analysis:    Component Value Date/Time   COLORURINE AMBER (A) 07/13/2022 1900   APPEARANCEUR CLOUDY (A) 07/13/2022 1900   LABSPEC 1.017  07/13/2022 1900   PHURINE 5.0 07/13/2022 1900   GLUCOSEU 150 (A) 07/13/2022 1900   HGBUR MODERATE (A) 07/13/2022 1900   BILIRUBINUR NEGATIVE 07/13/2022 1900   KETONESUR NEGATIVE 07/13/2022 1900   PROTEINUR 100 (A) 07/13/2022 1900   NITRITE NEGATIVE 07/13/2022 1900   LEUKOCYTESUR NEGATIVE 07/13/2022 1900   Sepsis Labs: @LABRCNTIP (procalcitonin:4,lacticidven:4)  ) Recent Results (from the past 240 hour(s))  Blood culture (routine x 2)     Status: None   Collection Time: 07/11/22  2:50 PM   Specimen: BLOOD LEFT ARM  Result Value Ref Range Status   Specimen Description BLOOD LEFT ARM  Final   Special Requests   Final    BOTTLES DRAWN AEROBIC AND ANAEROBIC Blood Culture results may not be optimal due to an inadequate volume of blood received in culture bottles   Culture   Final    NO GROWTH  5 DAYS Performed at [facility], [place] [address], [place], [place] [address]    Report Status 07/16/2022 FINAL  Final  Blood culture (routine x 2)     Status: Abnormal   Collection Time: 07/11/22  3:00 PM   Specimen: BLOOD  Result Value Ref Range Status   Specimen Description BLOOD LEFT ANTECUBITAL  Final   Special Requests   Final    BOTTLES DRAWN AEROBIC AND ANAEROBIC Blood Culture adequate volume   Culture  Setup Time   Final    GRAM POSITIVE RODS ANAEROBIC BOTTLE ONLY CRITICAL RESULT CALLED TO, READ BACK BY AND VERIFIED WITH: PHARMD JAMES LEDFORD 07/12/22@4 :50 BY TW    Culture (A)  Final    BACILLUS SPECIES Standardized susceptibility testing for this organism is not available. Performed at [facility], [place] [address], [place], [place] [address]    Report Status 07/13/2022 FINAL  Final  MRSA Next Gen by PCR, Nasal     Status: None   Collection Time: 07/11/22  5:36 PM   Specimen: Nasal Mucosa; Nasal Swab  Result Value Ref Range Status   MRSA by PCR Next Gen NOT DETECTED NOT DETECTED Final    Comment: (NOTE) The GeneXpert MRSA Assay (FDA approved for NASAL specimens  only), is one component of a comprehensive MRSA colonization surveillance program. It is not intended to diagnose MRSA infection nor to guide or monitor treatment for MRSA infections. Test performance is not FDA approved in patients less than 30 years old. Performed at [facility], [place] [address] [place], [place] [address]   Culture, Respiratory w Gram Stain     Status: None   Collection Time: 07/11/22  5:40 PM   Specimen: Tracheal Aspirate; Respiratory  Result Value Ref Range Status   Specimen Description TRACHEAL ASPIRATE  Final   Special Requests Normal  Final   Gram Stain   Final    RARE WBC PRESENT, PREDOMINANTLY PMN FEW GRAM POSITIVE COCCI RARE GRAM VARIABLE ROD    Culture   Final    Normal respiratory flora-no Staph aureus or Pseudomonas seen Performed at [facility], [address] [place], [place] [address]    Report Status 07/15/2022 FINAL  Final  SARS Coronavirus 2 by RT PCR (hospital order, performed in [facility] hospital lab) *cepheid single result test* Anterior Nasal Swab     Status: None   Collection Time: 07/12/22 11:42 AM   Specimen: Anterior Nasal Swab  Result Value Ref Range Status   SARS Coronavirus 2 by RT PCR NEGATIVE NEGATIVE Final    Comment: (NOTE) SARS-CoV-2 target nucleic acids are NOT DETECTED.  The SARS-CoV-2 RNA is generally detectable in upper and lower respiratory specimens during the acute phase of infection. The lowest concentration of SARS-CoV-2 viral copies this assay can detect is 250 copies / mL. A negative result does not preclude SARS-CoV-2 infection and should not be used as the sole basis for treatment or other patient management decisions.  A negative result may occur with improper specimen collection / handling, submission of specimen other than nasopharyngeal swab, presence of viral mutation(s) within the areas targeted by this assay, and inadequate number of viral copies (<250 copies / mL). A negative result  must be combined with clinical observations, patient history, and epidemiological information.  Fact Sheet for Patients:   https://www.patel.info/  Fact Sheet for Healthcare Providers: https://hall.com/  This test is not yet approved or  cleared by the [place] and  has been authorized for detection and/or diagnosis of SARS-CoV-2 by FDA under an Emergency Use Authorization (EUA).  This EUA will remain in effect (meaning this test can be used) for the duration of the COVID-19 declaration under Section 564(b)(1) of the Act, 21 U.S.C. section 360bbb-3(b)(1), unless the authorization is terminated or revoked sooner.  Performed at [facility], [place] [address] [place], [place] [address]      Radiology Studies: DG CHEST PORT 1 VIEW  Result Date: 07/18/2022 CLINICAL DATA:  Shortness of breath respiratory distress. EXAM: PORTABLE CHEST 1 VIEW COMPARISON:  Chest x-ray 07/13/2022. FINDINGS: Increased peripheral predominant airspace opacities bilaterally. Suspected small left pleural effusion. No visible pneumothorax. Cardiomediastinal silhouette is unchanged. Median sternotomy. Left subclavian approach cardiac rhythm maintenance device. Polyarticular degenerative change. IMPRESSION: 1. Increased peripheral predominant interstitial opacities bilaterally, suspicious for interstitial edema or atypical/multifocal infection. Recommend follow-up to resolution. 2. Suspected small left pleural effusion. Electronically Signed   By: Margaretha Sheffield M.D.   On: 07/18/2022 09:51     Scheduled Meds:  allopurinol  100 mg Oral Daily   amiodarone  400 mg Oral BID   apixaban  5 mg Oral BID   arformoterol  15 mcg Nebulization BID   atorvastatin  40 mg Oral Daily   bisacodyl  10 mg Rectal Once   budesonide (PULMICORT) nebulizer solution  0.5 mg Nebulization BID   carvedilol  25 mg Oral BID   Chlorhexidine Gluconate Cloth  6 each Topical Daily    clopidogrel  75 mg Oral Daily   darbepoetin (ARANESP) injection - NON-DIALYSIS  100 mcg Subcutaneous Q Sat-1800   doxazosin  2 mg Oral Daily   insulin aspart  0-9 Units Subcutaneous TID WC   loratadine  10 mg Oral Daily   polyethylene glycol  17 g Oral BID   revefenacin  175 mcg Nebulization Daily   senna-docusate  2 tablet Oral BID   Continuous Infusions:     LOS: 7 days    Time spent: 32min  Domenic Polite, MD [facility]   07/18/2022, 1:58 PM

## 2022-07-18 NOTE — Progress Notes (Signed)
Physical Therapy Treatment Patient Details Name: Joe Wiggins MRN: [id number] DOB: Sep 02, 1946 Today's Date: 07/18/2022   History of Present Illness 76 yo male admitted 7/17 with acute CHf exacerbation with respiratory failure. Intubated 7/17-7/19. PMHx: COPD, HLD, CAD, ICD, NSTEMI, CABG, vascular dementia, HFrEF, HTN, anemia, gout, OSA with CPAP use, and CKD    PT Comments    Pt supine in bed on arrival this session.  Pt continues to benefit skilled rehab.  He is limited due to [facility] and given dose of lasix this am.  He refused gt training but did perform gt in room and agreeable to sit in recliner chair.  Pt continues to benefit from HHPT and multiple OOB opportunities this admission.   BP did drop during session and he reports mild dizziness see BP reading below.  Plan to check orthostatic vitals next session.     Recommendations for follow up therapy are one component of a multi-disciplinary discharge planning process, led by the attending physician.  Recommendations may be updated based on patient status, additional functional criteria and insurance authorization.  Follow Up Recommendations  Home health PT     Assistance Recommended at Discharge Frequent or constant Supervision/Assistance  Patient can return home with the following A little help with walking and/or transfers;A little help with bathing/dressing/bathroom;Assistance with cooking/housework;Direct supervision/assist for financial management;Help with stairs or ramp for entrance;Direct supervision/assist for medications management;Assist for transportation   Equipment Recommendations  None recommended by PT    Recommendations for Other Services       Precautions / Restrictions Precautions Precautions: Fall Precaution Comments: watch sats Restrictions Weight Bearing Restrictions: No     Mobility  Bed Mobility Overal bed mobility: Needs Assistance Bed Mobility: Rolling, Sidelying to Sit Rolling: Min assist Sidelying  to sit: Min assist       General bed mobility comments: min assistance to pushing with L leg and reach with L hand.  Pt required assistance to elevate trunk into sitting.  Pt continues to required increased time and cues for sequencing and line/lead management.    Transfers Overall transfer level: Needs assistance Equipment used: None Transfers: Sit to/from Stand Sit to Stand: Min assist           General transfer comment: Min assistance to rise into standing.  Cues for hand placement to and from seated surface.    Ambulation/Gait Ambulation/Gait assistance: Min assist Gait Distance (Feet): 12 Feet Assistive device: Rolling walker (2 wheels) Gait Pattern/deviations: Step-through pattern, Decreased stride length       General Gait Details: Pt very deconditioned and unable to perform gt distance he did on Thursday.  He reports feeling fatigue with DOE.  His sats remain 91% - 95% on supplemental O2.  He reports feeling mild dizziness post session. BP pre session 167/83 supine and seated in recliner 141/65.  Cues for pursed lip breathing.   Stairs             Wheelchair Mobility    Modified Rankin (Stroke Patients Only)       Balance Overall balance assessment: Needs assistance Sitting-balance support: Feet supported Sitting balance-[name]: Good     Standing balance support: No upper extremity supported Standing balance-[name]: Poor Standing balance comment: required use of bed rail to walk around bed to recliner chair on opposite side of room.                            Cognition Arousal/Alertness: Awake/alert  Behavior During Therapy: WFL for tasks assessed/performed Overall Cognitive Status: History of cognitive impairments - at baseline                                          Exercises      General Comments        Pertinent Vitals/Pain Pain Assessment Pain Assessment: 0-10    Home Living                           Prior Function            PT Goals (current goals can now be found in the care plan section) Acute Rehab PT Goals Patient Stated Goal: return home Potential to Achieve Goals: Fair Progress towards PT goals: Progressing toward goals    Frequency    Min 3X/week      PT Plan Current plan remains appropriate    Co-evaluation              AM-PAC PT "6 Clicks" Mobility   Outcome Measure  Help needed turning from your back to your side while in a flat bed without using bedrails?: A Little Help needed moving from lying on your back to sitting on the side of a flat bed without using bedrails?: A Little Help needed moving to and from a bed to a chair (including a wheelchair)?: A Little Help needed standing up from a chair using your arms (e.g., wheelchair or bedside chair)?: A Little Help needed to walk in hospital room?: A Little Help needed climbing 3-5 steps with a railing? : A Little 6 Click Score: 18    End of Session Equipment Utilized During Treatment: Gait belt Activity Tolerance: Patient tolerated treatment well Patient left: in chair;with call bell/phone within reach Nurse Communication: Mobility status PT Visit Diagnosis: Other abnormalities of gait and mobility (R26.89);Difficulty in walking, not elsewhere classified (R26.2);Muscle weakness (generalized) (M62.81)     Time: 4680-3212 PT Time Calculation (min) (ACUTE ONLY): 18 min  Charges:  $Therapeutic Activity: 8-22 mins                     Erasmo Leventhal , PTA Acute Rehabilitation Services Office [phone]    Delfina Schreurs Eli Hose 07/18/2022, 11:58 AM

## 2022-07-18 NOTE — TOC Progression Note (Addendum)
Transition of Care [facility]) - Progression Note    Patient Details  Name: Joe Wiggins MRN: [id number] Date of Birth: 08/31/1946  Transition of Care [facility]) CM/SW [place], RN Phone [phone]  07/18/2022, 2:59 PM  Clinical Narrative:    CM received message from Viann Fish NP to inform CM that patient has decided to discharge home with hospice services. Patient and family have decided on [facility]. Hospice referral has been called to [facility] with [facility]. TOC will continue to follow.         Expected Discharge Plan and Services                                                 Social Determinants of Health (SDOH) Interventions    Readmission Risk Interventions     No data to display

## 2022-07-18 NOTE — Progress Notes (Signed)
[facility] Progress Note    Assessment/ Plan:   AKI on CKD4: Likely secondary to diuresis+urinary retention. Follows w/ Dr. 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  I briefly asked about what discussions had been held about dialysis for him.  From what I see I think patient would be a poor candidate for chronic HD-  would not improve his QOL.  Per wife pt has been "going downhill "for months.  I dont see where palliative care has seen but that may be an idea   AHRF secondary to acute on chronic COPD and acute on chronic HFrEF -s/p diuresis which is currently on hold-  I dont think a lot of volume on board -cardio following. COPD management per primary service -extubated 7/19 but requiring 5 liters [place] and is quite debilitated   Hypertension: -BP high on coreg and low dose cardura-  will inc dose of cardura   Anemia due to chronic disease: -Transfuse for Hgb<7 g/dL. Iron replete on panel. hgb stable today, ESA subq 7/22   PAF -on amio, eliquis  Urinary retention -s/p foley 7/20, will likely need to stay in with urology referral on d/c    Subjective:   No acute events overnight. Wife at bedside. Only 350 of UOP recorded but looks like missed a shift-  he denies significant complaints-  no nausea    Objective:   BP (!) 175/72 (BP Location: Right Arm) Comment: Simultaneous filing. User may not have seen previous data.  Pulse 70   Temp 98.3 F (36.8 C) (Oral)   Resp 20   Ht 5\' 9"  (1.753 m)   Wt 84 kg   SpO2 98%   BMI 27.35 kg/m   Intake/Output Summary (Last 24 hours) at 07/18/2022 0713 Last data filed at 07/18/2022 0437 Gross per 24 hour  Intake 240 ml  Output 350 ml  Net -110 ml   Weight change:   Physical  Exam: Gen:NAD, sitting up in bed eating breakfast CVS: RRR Resp:normal wob, no w/r/r/c, poor air exchange bilaterally KCM:KLKJ, NT Ext: no sig edema b/l LEs Neuro: awake, alert  Imaging: No results found.  Labs: BMET Recent Labs  Lab 07/12/22 1326 07/12/22 1621 07/12/22 2102 07/13/22 1791 07/14/22 0603 07/15/22 0726 07/16/22 0042 07/17/22 0036 07/18/22 0047  NA  --  141 140 140 141 137 133* 136 138  K  --  3.8 3.9 4.0 4.2 3.8 3.6 3.8 4.0  CL  --  106 104 106 102 101 100 104 107  CO2  --  21* 20* 22 22 22  20* 21* 22  GLUCOSE  --  154* 160* 152* 115* 104* 107* 106* 110*  BUN  --  38* 38* 47* 65* 77* 81* 84* 89*  CREATININE  --  3.31* 3.23* 3.54* 3.75* 3.81* 3.70* 3.66* 3.91*  CALCIUM  --  9.7 9.9 9.4 9.4 9.2 8.8* 8.8* 8.9  PHOS 2.2* 2.7  --  3.7 4.9* 5.3* 5.5* 5.6*  --    CBC Recent Labs  Lab 07/11/22 1352 07/11/22 1435 07/14/22 0603 07/16/22 0042 07/17/22 0036 07/18/22 0047  WBC 13.4*   < > 12.1* 8.1 8.5 9.5  NEUTROABS 9.8*  --   --   --   --   --  HGB 10.8*   < > 9.9* 8.6* 8.6* 8.2*  HCT 35.0*   < > 30.2* 27.0* 26.5* 25.6*  MCV 102.0*   < > 95.6 96.1 95.0 96.2  PLT 352   < > 262 189 177 187   < > = values in this interval not displayed.    Medications:     allopurinol  100 mg Oral Daily   amiodarone  400 mg Oral BID   apixaban  5 mg Oral BID   arformoterol  15 mcg Nebulization BID   atorvastatin  40 mg Oral Daily   budesonide (PULMICORT) nebulizer solution  0.5 mg Nebulization BID   carvedilol  25 mg Oral BID   Chlorhexidine Gluconate Cloth  6 each Topical Daily   clopidogrel  75 mg Oral Daily   darbepoetin (ARANESP) injection - NON-DIALYSIS  100 mcg Subcutaneous Q Sat-1800   doxazosin  1 mg Oral Daily   insulin aspart  0-9 Units Subcutaneous TID WC   loratadine  10 mg Oral Daily   polyethylene glycol  17 g Oral Daily   revefenacin  175 mcg Nebulization Daily      Jolan Upchurch [facility] 07/18/2022, 7:13 AM

## 2022-07-18 NOTE — Progress Notes (Signed)
Heart Failure Navigator Progress Note  Assessed for Heart & Vascular TOC clinic readiness.  Patient does not meet criteria due to patient will be discharging home with hospice. .   Navigator available for reassessment of patient.   Earnestine Leys, BSN, Clinical cytogeneticist Only

## 2022-07-18 NOTE — Progress Notes (Signed)
OT Cancellation Note  Patient Details Name: Joe Wiggins MRN: [id number] DOB: 04-Dec-1946   Cancelled Treatment:    Reason Eval/Treat Not Completed: Other (comment) (RN asking to hold until after lasix dut to fluid on lungs). Will return as schedule allows.   Joe Wiggins, Joe Wiggins [facility] Acute Rehabilitation Office: [phone]   Joe Wiggins 07/18/2022, 10:17 AM

## 2022-07-19 DIAGNOSIS — E440 Moderate protein-calorie malnutrition: Secondary | ICD-10-CM | POA: Diagnosis not present

## 2022-07-19 DIAGNOSIS — I5043 Acute on chronic combined systolic (congestive) and diastolic (congestive) heart failure: Secondary | ICD-10-CM | POA: Diagnosis not present

## 2022-07-19 DIAGNOSIS — N179 Acute kidney failure, unspecified: Secondary | ICD-10-CM | POA: Diagnosis not present

## 2022-07-19 DIAGNOSIS — J9601 Acute respiratory failure with hypoxia: Secondary | ICD-10-CM | POA: Diagnosis not present

## 2022-07-19 DIAGNOSIS — Z515 Encounter for palliative care: Secondary | ICD-10-CM | POA: Diagnosis not present

## 2022-07-19 DIAGNOSIS — I5023 Acute on chronic systolic (congestive) heart failure: Secondary | ICD-10-CM | POA: Diagnosis not present

## 2022-07-19 LAB — BASIC METABOLIC PANEL
Anion gap: 13 (ref 5–15)
BUN: 87 mg/dL — ABNORMAL HIGH (ref 8–23)
CO2: 19 mmol/L — ABNORMAL LOW (ref 22–32)
Calcium: 9 mg/dL (ref 8.9–10.3)
Chloride: 105 mmol/L (ref 98–111)
Creatinine, Ser: 3.89 mg/dL — ABNORMAL HIGH (ref 0.61–1.24)
GFR, Estimated: 15 mL/min — ABNORMAL LOW (ref >60)
Glucose, Bld: 98 mg/dL (ref 70–99)
Potassium: 3.9 mmol/L (ref 3.5–5.1)
Sodium: 137 mmol/L (ref 135–145)

## 2022-07-19 LAB — CBC
HCT: 26.1 % — ABNORMAL LOW (ref 39.0–52.0)
Hemoglobin: 8.3 g/dL — ABNORMAL LOW (ref 13.0–17.0)
MCH: 30.5 pg (ref 26.0–34.0)
MCHC: 31.8 g/dL (ref 30.0–36.0)
MCV: 96 fL (ref 80.0–100.0)
Platelets: 176 10*3/uL (ref 150–400)
RBC: 2.72 MIL/uL — ABNORMAL LOW (ref 4.22–5.81)
RDW: 17.1 % — ABNORMAL HIGH (ref 11.5–15.5)
WBC: 6.6 10*3/uL (ref 4.0–10.5)
nRBC: 0 % (ref 0.0–0.2)

## 2022-07-19 LAB — GLUCOSE, CAPILLARY
Glucose-Capillary: 116 mg/dL — ABNORMAL HIGH (ref 70–99)
Glucose-Capillary: 128 mg/dL — ABNORMAL HIGH (ref 70–99)
Glucose-Capillary: 128 mg/dL — ABNORMAL HIGH (ref 70–99)
Glucose-Capillary: 123 mg/dL — ABNORMAL HIGH (ref 70–99)

## 2022-07-19 MED ORDER — FUROSEMIDE 10 MG/ML IJ SOLN
80.0000 mg | Freq: Once | INTRAMUSCULAR | Status: AC
  Administered 2022-07-19: 80 mg via INTRAVENOUS
  Filled 2022-07-19: qty 8

## 2022-07-19 NOTE — Progress Notes (Signed)
Physical Therapy Treatment Patient Details Name: Joe Wiggins MRN: [id number] DOB: 03-30-1946 Today's Date: 07/19/2022   History of Present Illness 76 yo male admitted 7/17 with acute CHf exacerbation with respiratory failure. Intubated 7/17-7/19. PMHx: COPD, HLD, CAD, ICD, NSTEMI, CABG, vascular dementia, HFrEF, HTN, anemia, gout, OSA with CPAP use, and CKD    PT Comments    Pt  agreeable to session however continues to be limited by increased fatigue and general global weakness. Pt demonstrating ambulation in room and fait tolerance for standing therex for increased strength. Pt able to maintain standing for majority of session with seated recovery needed x3 between bouts. SpO2 stable throughout session with 5L supplemental O2. Educated pt re' importance of continued mobility and time OOB, activity recommendations and increasing activity tolerance slowly with pt and spouse verbalizing understanding. Pt continues to benefit from skilled PT services to progress toward functional mobility goals.    Recommendations for follow up therapy are one component of a multi-disciplinary discharge planning process, led by the attending physician.  Recommendations may be updated based on patient status, additional functional criteria and insurance authorization.  Follow Up Recommendations  Home health PT     Assistance Recommended at Discharge Frequent or constant Supervision/Assistance  Patient can return home with the following A little help with walking and/or transfers;A little help with bathing/dressing/bathroom;Assistance with cooking/housework;Direct supervision/assist for financial management;Help with stairs or ramp for entrance;Direct supervision/assist for medications management;Assist for transportation   Equipment Recommendations  None recommended by PT    Recommendations for Other Services       Precautions / Restrictions Precautions Precautions: Fall Precaution Comments: watch  sats Restrictions Weight Bearing Restrictions: No     Mobility  Bed Mobility Overal bed mobility: Needs Assistance Bed Mobility: Rolling, Sidelying to Sit, Sit to Supine Rolling: Supervision Sidelying to sit: Min assist   Sit to supine: Min assist   General bed mobility comments: min assist to elevate trunk and to respoition ocne supine.    Transfers Overall transfer level: Needs assistance Equipment used: Rolling walker (2 wheels) Transfers: Sit to/from Stand Sit to Stand: Min guard           General transfer comment: Min guard for sit to stand x4 with cues for hand placement/safety. light hand on back to facilitate anterior weight shift    Ambulation/Gait Ambulation/Gait assistance: Min guard Gait Distance (Feet): 6 Feet (x2) Assistive device: Rolling walker (2 wheels) Gait Pattern/deviations: Step-through pattern, Decreased stride length       General Gait Details: slow steps from EOB to wall and backwards to EOB x2 with seated rest between   Stairs             Wheelchair Mobility    Modified Rankin (Stroke Patients Only)       Balance Overall balance assessment: Needs assistance Sitting-balance support: Feet supported Sitting balance-[name]: Good     Standing balance support: No upper extremity supported Standing balance-[name]: Fair Standing balance comment: Able to maintain static standing at sink level without UE support.                            Cognition Arousal/Alertness: Awake/alert Behavior During Therapy: WFL for tasks assessed/performed Overall Cognitive Status: History of cognitive impairments - at baseline  Exercises General Exercises - Lower Extremity Hip Flexion/Marching: AROM, Right, Left, 20 reps, Standing Mini-Sqauts: 10 reps, Standing    General Comments General comments (skin integrity, edema, etc.): SpO2 97% on 5L O2 via [place] at rest.  95% on 5L with light activity/mobility. encouraged pt to continue mobility throughout day, as well as decreasing amount of time spent in bed, with pt and spouse verbalizing understanding      Pertinent Vitals/Pain Pain Assessment Pain Assessment: Faces Faces Pain Scale: Hurts a little bit Pain Location: bottom Pain Descriptors / Indicators: Sore Pain Intervention(s): Monitored during session    Home Living                          Prior Function            PT Goals (current goals can now be found in the care plan section) Acute Rehab PT Goals PT Goal Formulation: With patient/family Time For Goal Achievement: 07/28/22    Frequency    Min 3X/week      PT Plan Current plan remains appropriate    Co-evaluation              AM-PAC PT "6 Clicks" Mobility   Outcome Measure  Help needed turning from your back to your side while in a flat bed without using bedrails?: A Little Help needed moving from lying on your back to sitting on the side of a flat bed without using bedrails?: A Little Help needed moving to and from a bed to a chair (including a wheelchair)?: A Little Help needed standing up from a chair using your arms (e.g., wheelchair or bedside chair)?: A Little Help needed to walk in hospital room?: A Little Help needed climbing 3-5 steps with a railing? : A Lot 6 Click Score: 17    End of Session   Activity Tolerance: Patient tolerated treatment well Patient left: in bed;with call bell/phone within reach;with family/visitor present Nurse Communication: Mobility status PT Visit Diagnosis: Other abnormalities of gait and mobility (R26.89);Difficulty in walking, not elsewhere classified (R26.2);Muscle weakness (generalized) (M62.81)     Time: 4496-7591 PT Time Calculation (min) (ACUTE ONLY): 25 min  Charges:  $Gait Training: 8-22 mins $Therapeutic Exercise: 8-22 mins                     Ubaldo Daywalt R. PTA Acute Rehabilitation Services Office:  [place] 07/19/2022, 2:24 PM

## 2022-07-19 NOTE — Progress Notes (Signed)
Daily Progress Note   [name]       Date: 07/19/2022 DOB: 05/20/1946  Age: 76 y.o. MRN#: [id number] Attending Physician: Domenic Polite, MD Primary Care Physician: Charlaine Dalton, MD Admit Date: 07/11/2022  Reason for Consultation/Follow-up: Establishing goals of care  Patient Profile/HPI:   76 y.o. male  with past medical history of COPD with chronic respiratory failure on home oxygen, paroxysmal A-fib, CAD status post CABG x4 in 2014, CHF, CKD stage IV, dementia, admitted on 07/11/2022 with heart failure exacerbation in the setting of advanced end-stage COPD and CKD.  Admitted for diuresis.  His creatinine is trending up.  Per nephrology he is not a candidate for hemodialysis.  Palliative medicine consulted for goals of care.  Subjective:  Chart reviewed including progress notes and labs and medication use.  The patient did not use any as needed medications overnight.  He was resting comfortably I did not wake him.  Per nursing he was able to get up into a chair this morning with assistance of 2 nurses, however he was very weak. I called his spouse for follow-up.  Answered her questions regarding symptom management. Received message from hospice liaison she discussed ICD deactivation with his spouse and plan is to deactivate ICD before he goes home.    Physical Exam Vitals and nursing note reviewed.  Cardiovascular:     Rate and Rhythm: Bradycardia present.  Pulmonary:     Effort: Pulmonary effort is normal.  Skin:    General: Skin is warm and dry.  Neurological:     General: No focal deficit present.     Motor: Weakness present.             Vital Signs: BP (!) 150/54 (BP Location: Right Arm)   Pulse 62   Temp (!) 97.4 F (36.3 C) (Oral)   Resp 18   Ht 5'  9" (1.753 m)   Wt 82.9 kg   SpO2 (!) 85%   BMI 26.99 kg/m  SpO2: 99% O2 Device: O2 Device: Nasal Cannula O2 Flow Rate: O2 Flow Rate (L/min): 5 L/min  Intake/output summary:  Intake/Output Summary (Last 24 hours) at 07/19/2022 1051 Last data filed at 07/18/2022 2200 Gross per 24 hour  Intake 240 ml  Output 550 ml  Net -310 ml   LBM: Last  BM Date : 07/18/22 Baseline Weight: Weight: 81.8 kg Most recent weight: Weight: 82.9 kg       Palliative Assessment/Data: PPS: 40%      Patient Active Problem List   Diagnosis Date Noted   Malnutrition of moderate degree 07/12/2022   Acute exacerbation of CHF (congestive heart failure) ([place]) 07/11/2022   Iron deficiency anemia 04/18/2022   Lung nodule 03/10/2022   Acute on chronic combined systolic and diastolic CHF (congestive heart failure) ([place]) 03/08/2022   NSTEMI (non-ST elevated myocardial infarction) ([place]) 03/07/2022   Foot pain, right 81/82/9937   Chronic systolic heart failure ([place]) 02/16/2021   Screen for colon cancer 01/08/2021   Hyperthyroidism 01/08/2021   Dyspnea 12/27/2020   Acute CHF (congestive heart failure) ([place]) 12/27/2020   Venous insufficiency 05/27/2020   Sinus node dysfunction ([place]) 01/23/2020   Gout 01/21/2020   Hypertensive crisis 10/30/2019   Orthostatic hypotension 03/25/2018   Bradycardia 03/25/2018   History of cardiac arrest 03/25/2018   Vascular dementia without behavioral disturbance ([place]) 03/25/2018   Seasonal allergies 03/14/2018   ICD (implantable cardioverter-defibrillator) in place    Physical deconditioning    Cardiac arrest [facility])    CAD (coronary artery disease), native coronary artery 01/20/2016   Long term current use of anticoagulant therapy 01/20/2016   History of TIA (transient ischemic attack) and stroke 01/20/2016   Sleep apnea    Cardiac pacemaker in situ    Anemia 01/17/2016   Bilateral carotid artery stenosis    Hyperlipidemia    Dementia without behavioral disturbance (HCC)     Hypertensive heart disease without CHF    Paroxysmal atrial fibrillation (HCC)    CKD (chronic kidney disease) stage 4, GFR 15-29 ml/min (HCC)    COPD (chronic obstructive pulmonary disease) ([place])     Palliative Care Assessment & Plan    Assessment/Recommendations/Plan  Continue current plan of care-medical maximization with this hospitalization then discharge home with hospice Discussed with bedside RN use of symptom management meds-recommend using oxycodone liquid for shortness of breath and as premed for any activity, trazodone as needed for sleep Plan to discharge home with hospice   Code Status: DNR  Prognosis:  < 6 months  Discharge Planning: Home with Hospice  Care plan was discussed with patient's care team and bedside RN  Thank you for allowing the Palliative Medicine Team to assist in the care of this patient.  Greater than 50%  of this time was spent counseling and coordinating care related to the above assessment and plan.  Mariana Kaufman, AGNP-C Palliative Medicine   Please contact Palliative Medicine Team phone at [phone] for questions and concerns.

## 2022-07-19 NOTE — Progress Notes (Signed)
[facility] Progress Note    Assessment/ Plan:   AKI on CKD4: Likely secondary to diuresis+urinary retention. Follows w/ Dr. 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  I briefly asked about what discussions had been held about dialysis for him.  From what I see I think patient would be a poor candidate for chronic HD-  would not improve his QOL.  Per wife pt has been "going downhill "for months.  She agrees that dialysis is not a good idea for him.  We are still in this place of kidney function being poor but relatively stable-  I think since dialysis is not being considered, going home with hospice is a good idea for them  I have nothing else to offer at this time-  renal will sign off-  call with questions    AHRF secondary to acute on chronic COPD and acute on chronic HFrEF -s/p diuresis which is currently on hold-  I dont think a lot of volume on board -cardio following. COPD management per primary service -extubated 7/19 but requiring 5 liters [place] and is quite debilitated-  at discharge I would resume torsemide 40 daily with instructions to increase PRN for volume overload   Hypertension: -BP reasonable   Anemia due to chronic disease: -Transfuse for Hgb<7 g/dL. Iron replete on panel. hgb stable today, ESA subq 7/22   PAF -on amio, eliquis  Urinary retention -s/p foley 7/20, will likely need to stay in long term with urology referral on d/c    Subjective:   Pt sitting up at bedside eating an [place] ice-  feeling a bit better today.  Meeting with palliative yesterday sounds like they want to go home with hospice which seems appropriate-  900 UOP-  kidney function stable   Objective:   BP (!) 150/57 (BP Location:  Right Arm)   Pulse 63   Temp 97.8 F (36.6 C) (Tympanic)   Resp 19   Ht 5\' 9"  (1.753 m)   Wt 82.9 kg   SpO2 95%   BMI 26.99 kg/m   Intake/Output Summary (Last 24 hours) at 07/19/2022 0743 Last data filed at 07/18/2022 2200 Gross per 24 hour  Intake 240 ml  Output 900 ml  Net -660 ml   Weight change: -1.1 kg  Physical Exam: Gen:NAD, sitting up in bedside chair-  eating CVS: RRR Resp:normal wob, no w/r/r/c, poor air exchange bilaterally ZOX:WRUE, NT Ext: no sig edema b/l LEs Neuro: awake, alert  Imaging: DG CHEST PORT 1 VIEW  Result Date: 07/18/2022 CLINICAL DATA:  Shortness of breath respiratory distress. EXAM: PORTABLE CHEST 1 VIEW COMPARISON:  Chest x-ray 07/13/2022. FINDINGS: Increased peripheral predominant airspace opacities bilaterally. Suspected small left pleural effusion. No visible pneumothorax. Cardiomediastinal silhouette is unchanged. Median sternotomy. Left subclavian approach cardiac rhythm maintenance device. Polyarticular degenerative change. IMPRESSION: 1. Increased peripheral predominant interstitial opacities bilaterally, suspicious for interstitial edema or atypical/multifocal infection. Recommend follow-up to resolution. 2. Suspected small left pleural effusion. Electronically Signed   By: Margaretha Sheffield M.D.   On: 07/18/2022 09:51    Labs: [employer] Recent [employer] 07/12/22 1326 07/12/22 1621 07/12/22 2102 07/13/22 4540 07/14/22 9811 07/15/22 9147 07/16/22 8295 07/17/22 0036 07/18/22 0047 07/19/22 0035  NA  --  141   < > 140 141 137 133* 136 138 137  K  --  3.8   < > 4.0 4.2 3.8 3.6 3.8 4.0 3.9  CL  --  106   < > 106 102 101 100 104 107 105  CO2  --  21*   < > 22 22 22  20* 21* 22 19*  GLUCOSE  --  154*   < > 152* 115* 104* 107* 106* 110* 98  BUN  --  38*   < > 47* 65* 77* 81* 84* 89* 87*  CREATININE  --  3.31*   < > 3.54* 3.75* 3.81* 3.70* 3.66* 3.91* 3.89*  CALCIUM  --  9.7   < > 9.4 9.4 9.2 8.8* 8.8* 8.9 9.0  PHOS 2.2* 2.7  --  3.7 4.9* 5.3*  5.5* 5.6*  --   --    < > = values in this interval not displayed.   CBC Recent Labs  Lab 07/16/22 0042 07/17/22 0036 07/18/22 0047 07/19/22 0035  WBC 8.1 8.5 9.5 6.6  HGB 8.6* 8.6* 8.2* 8.3*  HCT 27.0* 26.5* 25.6* 26.1*  MCV 96.1 95.0 96.2 96.0  PLT 189 177 187 176    Medications:     allopurinol  100 mg Oral Daily   amiodarone  400 mg Oral BID   apixaban  5 mg Oral BID   arformoterol  15 mcg Nebulization BID   atorvastatin  40 mg Oral Daily   bisacodyl  10 mg Rectal Once   budesonide (PULMICORT) nebulizer solution  0.5 mg Nebulization BID   carvedilol  25 mg Oral BID   Chlorhexidine Gluconate Cloth  6 each Topical Daily   clopidogrel  75 mg Oral Daily   darbepoetin (ARANESP) injection - NON-DIALYSIS  100 mcg Subcutaneous Q Sat-1800   doxazosin  2 mg Oral Daily   insulin aspart  0-9 Units Subcutaneous TID WC   isosorbide-hydrALAZINE  0.5 tablet Oral BID   loratadine  10 mg Oral Daily   polyethylene glycol  17 g Oral BID   revefenacin  175 mcg Nebulization Daily   senna-docusate  2 tablet Oral BID      Louis Meckel  [facility] 07/19/2022, 7:43 AM

## 2022-07-19 NOTE — Progress Notes (Signed)
PROGRESS NOTE    Honor Fairbank  QMG:[id number] DOB: Jul 16, 1946 DOA: 07/11/2022 PCP: Charlaine Dalton, MD  76/F with history of COPD, chronic respiratory failure on 2 L home O2, CAD/CABG, ischemic cardiomyopathy with EF of 40%, ICD, CKD stage IV, mild dementia, history of CVA, GERD, hypertension presented to the ED with progressive dyspnea on exertion for 3 to 4 weeks.  In the ED he was intubated and admitted to the ICU, creatinine was 3.0 on admission, he was admitted to the ICU for respiratory failure secondary to pulmonary edema and COPD exacerbation. -Extubated 7/19, hospital course complicated by AKI on CKD 4 -Transferred from ICU to [facility] service 7/21 -Diuretics held, nephrology following -Diuretics resumed, palliative consulting, hospice being considered   Subjective: -Feels fair, breathing better than yesterday, last night was rough  Assessment and Plan:  Acute hypoxic and hypercarbic respiratory failure Required mechanical ventilation, extubated 7/19 -Diuresed with IV Lasix and treated for COPD exacerbation -wean O2 down to 2 L, Lasix held for the last several days on account of worsening AKI, diuretics repeated for volume overload yesterday afternoon, will give another dose today -Discharge planning, home with hospice services tomorrow  Acute on chronic systolic CHF Complicated by AKI on CKD 4 -Cards and nephrology following, diuresed with IV Lasix initially, diuretics on hold now, creatinine was plateauing now 3.9, discussed with nephrology, overall would be a poor dialysis candidate should his kidney function not improve, palliative consulted -Echo with EF of 40-45% with regional wall motion abnormalities, plan for medical management given renal insufficiency and poor functional status -Continue Coreg, aspirin, statin -GDMT limited by CKD, continue low-dose BiDil -Lasix 80 Mg x1 now  NSTEMI CAD/CABG -NSTEMI treated medically, echo with wall motion abnormalities -Continue  aspirin, Coreg, statin, treated with IV heparin X 48 hours, now back on Eliquis, also started on Plavix, aspirin discontinued  AKI on CKD 4 Urinary retention/suspected BPH -Baseline creatinine around 2.4-2.8, and on admission creatinine> 3, now 3.8, creatinine plateauing -Continue Foley catheter, started on Flomax-Will need Foley catheter at discharge -Defer diuretics to nephrology  COPD exacerbation -Treated with ceftriaxone, bronchodilators -antibiotics discontinued  Paroxysmal atrial fibrillation -Atrial tachycardia -Appreciate EP input, continue Coreg and changed amiodarone to po -Continue Eliquis  DVT prophylaxis: Eliquis Code Status: DNR Family Communication: Updated wife at bedside Disposition Plan: Home with hospice services tomorrow  Consultants: Cards, nephrology   Procedures:   Antimicrobials:    Objective: Vitals:   07/19/22 0804 07/19/22 0814 07/19/22 0940 07/19/22 1148  BP:  (!) 150/54  (!) 148/56  Pulse: 65 70 62 63  Resp: (!) 21 18  20   Temp:  (!) 97.4 F (36.3 C)  98.2 F (36.8 C)  TempSrc:  Oral  Axillary  SpO2: 93% (!) 85%  96%  Weight:      Height:        Intake/Output Summary (Last 24 hours) at 07/19/2022 1322 Last data filed at 07/18/2022 2200 Gross per 24 hour  Intake 240 ml  Output 550 ml  Net -310 ml   Filed Weights   07/16/22 0311 07/18/22 0433 07/19/22 0344  Weight: 83.6 kg 84 kg 82.9 kg    Examination:  General exam: Chronically ill male sitting up in bed, AAO x2, no distress HEENT: Positive JVD CVs: S1-S2, irregularly irregular rhythm Lungs: Decreased breath sounds at bases, trace basilar rales Abdomen: Soft, nontender, bowel sounds present Extremities: No edema  Psychiatry:  Mood & affect appropriate.     Data Reviewed:   CBC: Recent Labs  Lab 07/14/22 0603 07/16/22 0042 07/17/22 0036 07/18/22 0047 07/19/22 0035  WBC 12.1* 8.1 8.5 9.5 6.6  HGB 9.9* 8.6* 8.6* 8.2* 8.3*  HCT 30.2* 27.0* 26.5* 25.6* 26.1*  MCV  95.6 96.1 95.0 96.2 96.0  PLT 262 189 177 187 950   Basic Metabolic Panel: Recent Labs  Lab 07/12/22 1326 07/12/22 1621 07/12/22 2102 07/13/22 9326 07/14/22 0603 07/15/22 0726 07/16/22 0042 07/17/22 0036 07/18/22 0047 07/19/22 0035  NA  --  141   < > 140 141 137 133* 136 138 137  K  --  3.8   < > 4.0 4.2 3.8 3.6 3.8 4.0 3.9  CL  --  106   < > 106 102 101 100 104 107 105  CO2  --  21*   < > 22 22 22  20* 21* 22 19*  GLUCOSE  --  154*   < > 152* 115* 104* 107* 106* 110* 98  BUN  --  38*   < > 47* 65* 77* 81* 84* 89* 87*  CREATININE  --  3.31*   < > 3.54* 3.75* 3.81* 3.70* 3.66* 3.91* 3.89*  CALCIUM  --  9.7   < > 9.4 9.4 9.2 8.8* 8.8* 8.9 9.0  MG 2.5* 2.4  --  2.4 2.6* 2.3  --   --   --   --   PHOS 2.2* 2.7  --  3.7 4.9* 5.3* 5.5* 5.6*  --   --    < > = values in this interval not displayed.   GFR: Estimated Creatinine Clearance: 15.9 mL/min (A) (by C-G formula based on SCr of 3.89 mg/dL (H)). Liver Function Tests: Recent Labs  Lab 07/13/22 0613  AST 36  ALT 16  ALKPHOS 104  BILITOT 0.6  PROT 6.3*  ALBUMIN 3.4*   No results for input(s): "LIPASE", "AMYLASE" in the last 168 hours. No results for input(s): "AMMONIA" in the last 168 hours. Coagulation Profile: No results for input(s): "INR", "PROTIME" in the last 168 hours.  Cardiac Enzymes: No results for input(s): "CKTOTAL", "CKMB", "CKMBINDEX", "TROPONINI" in the last 168 hours. BNP (last 3 results) No results for input(s): "PROBNP" in the last 8760 hours. HbA1C: No results for input(s): "HGBA1C" in the last 72 hours. CBG: Recent Labs  Lab 07/18/22 1151 07/18/22 1619 07/18/22 2108 07/19/22 0616 07/19/22 1154  GLUCAP 135* 122* 108* 116* 128*   Lipid Profile: No results for input(s): "CHOL", "HDL", "LDLCALC", "TRIG", "CHOLHDL", "LDLDIRECT" in the last 72 hours.  Thyroid Function Tests: No results for input(s): "TSH", "T4TOTAL", "FREET4", "T3FREE", "THYROIDAB" in the last 72 hours.  Anemia Panel: No  results for input(s): "VITAMINB12", "FOLATE", "FERRITIN", "TIBC", "IRON", "RETICCTPCT" in the last 72 hours.  Urine analysis:    Component Value Date/Time   COLORURINE AMBER (A) 07/13/2022 1900   APPEARANCEUR CLOUDY (A) 07/13/2022 1900   LABSPEC 1.017 07/13/2022 1900   PHURINE 5.0 07/13/2022 1900   GLUCOSEU 150 (A) 07/13/2022 1900   HGBUR MODERATE (A) 07/13/2022 1900   BILIRUBINUR NEGATIVE 07/13/2022 1900   KETONESUR NEGATIVE 07/13/2022 1900   PROTEINUR 100 (A) 07/13/2022 1900   NITRITE NEGATIVE 07/13/2022 1900   LEUKOCYTESUR NEGATIVE 07/13/2022 1900   Sepsis Labs: @LABRCNTIP (procalcitonin:4,lacticidven:4)  ) Recent Results (from the past 240 hour(s))  Blood culture (routine x 2)     Status: None   Collection Time: 07/11/22  2:50 PM   Specimen: BLOOD LEFT ARM  Result Value Ref Range Status   Specimen Description BLOOD LEFT ARM  Final  Special Requests   Final    BOTTLES DRAWN AEROBIC AND ANAEROBIC Blood Culture results may not be optimal due to an inadequate volume of blood received in culture bottles   Culture   Final    NO GROWTH 5 DAYS Performed at [facility], [place] [address], [place], [place] [address]    Report Status 07/16/2022 FINAL  Final  Blood culture (routine x 2)     Status: Abnormal   Collection Time: 07/11/22  3:00 PM   Specimen: BLOOD  Result Value Ref Range Status   Specimen Description BLOOD LEFT ANTECUBITAL  Final   Special Requests   Final    BOTTLES DRAWN AEROBIC AND ANAEROBIC Blood Culture adequate volume   Culture  Setup Time   Final    GRAM POSITIVE RODS ANAEROBIC BOTTLE ONLY CRITICAL RESULT CALLED TO, READ BACK BY AND VERIFIED WITH: PHARMD JAMES LEDFORD 07/12/22@4 :50 BY TW    Culture (A)  Final    BACILLUS SPECIES Standardized susceptibility testing for this organism is not available. Performed at [facility], [place] [address], [place], [place] [address]    Report Status 07/13/2022 FINAL  Final  MRSA Next Gen by PCR, Nasal      Status: None   Collection Time: 07/11/22  5:36 PM   Specimen: Nasal Mucosa; Nasal Swab  Result Value Ref Range Status   MRSA by PCR Next Gen NOT DETECTED NOT DETECTED Final    Comment: (NOTE) The GeneXpert MRSA Assay (FDA approved for NASAL specimens only), is one component of a comprehensive MRSA colonization surveillance program. It is not intended to diagnose MRSA infection nor to guide or monitor treatment for MRSA infections. Test performance is not FDA approved in patients less than 56 years old. Performed at [facility], [place] [address] [place], [place] [address]   Culture, Respiratory w Gram Stain     Status: None   Collection Time: 07/11/22  5:40 PM   Specimen: Tracheal Aspirate; Respiratory  Result Value Ref Range Status   Specimen Description TRACHEAL ASPIRATE  Final   Special Requests Normal  Final   Gram Stain   Final    RARE WBC PRESENT, PREDOMINANTLY PMN FEW GRAM POSITIVE COCCI RARE GRAM VARIABLE ROD    Culture   Final    Normal respiratory flora-no Staph aureus or Pseudomonas seen Performed at [facility], [address], [place], [place] [address]    Report Status 07/15/2022 FINAL  Final  SARS Coronavirus 2 by RT PCR (hospital order, performed in [facility] hospital lab) *cepheid single result test* Anterior Nasal Swab     Status: None   Collection Time: 07/12/22 11:42 AM   Specimen: Anterior Nasal Swab  Result Value Ref Range Status   SARS Coronavirus 2 by RT PCR NEGATIVE NEGATIVE Final    Comment: (NOTE) SARS-CoV-2 target nucleic acids are NOT DETECTED.  The SARS-CoV-2 RNA is generally detectable in upper and lower respiratory specimens during the acute phase of infection. The lowest concentration of SARS-CoV-2 viral copies this assay can detect is 250 copies / mL. A negative result does not preclude SARS-CoV-2 infection and should not be used as the sole basis for treatment or other patient management decisions.  A negative result may  occur with improper specimen collection / handling, submission of specimen other than nasopharyngeal swab, presence of viral mutation(s) within the areas targeted by this assay, and inadequate number of viral copies (<250 copies / mL). A negative result  must be combined with clinical observations, patient history, and epidemiological information.  Fact Sheet for Patients:   https://www.patel.info/  Fact Sheet for Healthcare Providers: https://hall.com/  This test is not yet approved or  cleared by the [place] FDA and has been authorized for detection and/or diagnosis of SARS-CoV-2 by FDA under an Emergency Use Authorization (EUA).  This EUA will remain in effect (meaning this test can be used) for the duration of the COVID-19 declaration under Section 564(b)(1) of the Act, 21 U.S.C. section 360bbb-3(b)(1), unless the authorization is terminated or revoked sooner.  Performed at [facility], [place] [address], [place], [place] [address]      Radiology Studies: DG CHEST PORT 1 VIEW  Result Date: 07/18/2022 CLINICAL DATA:  Shortness of breath respiratory distress. EXAM: PORTABLE CHEST 1 VIEW COMPARISON:  Chest x-ray 07/13/2022. FINDINGS: Increased peripheral predominant airspace opacities bilaterally. Suspected small left pleural effusion. No visible pneumothorax. Cardiomediastinal silhouette is unchanged. Median sternotomy. Left subclavian approach cardiac rhythm maintenance device. Polyarticular degenerative change. IMPRESSION: 1. Increased peripheral predominant interstitial opacities bilaterally, suspicious for interstitial edema or atypical/multifocal infection. Recommend follow-up to resolution. 2. Suspected small left pleural effusion. Electronically Signed   By: Margaretha Sheffield M.D.   On: 07/18/2022 09:51     Scheduled Meds:  allopurinol  100 mg Oral Daily   amiodarone  400 mg Oral BID   apixaban  5 mg Oral BID    arformoterol  15 mcg Nebulization BID   atorvastatin  40 mg Oral Daily   bisacodyl  10 mg Rectal Once   budesonide (PULMICORT) nebulizer solution  0.5 mg Nebulization BID   carvedilol  25 mg Oral BID   Chlorhexidine Gluconate Cloth  6 each Topical Daily   clopidogrel  75 mg Oral Daily   darbepoetin (ARANESP) injection - NON-DIALYSIS  100 mcg Subcutaneous Q Sat-1800   doxazosin  2 mg Oral Daily   insulin aspart  0-9 Units Subcutaneous TID WC   isosorbide-hydrALAZINE  0.5 tablet Oral BID   loratadine  10 mg Oral Daily   polyethylene glycol  17 g Oral BID   revefenacin  175 mcg Nebulization Daily   senna-docusate  2 tablet Oral BID   Continuous Infusions:     LOS: 8 days    Time spent: 26min  Domenic Polite, MD [facility]   07/19/2022, 1:22 PM

## 2022-07-19 NOTE — Progress Notes (Signed)
Occupational Therapy Treatment Patient Details Name: Joe Wiggins MRN: [id number] DOB: [birth year] Today's Date: 07/19/2022   History of present illness 76 yo male admitted 7/17 with acute CHf exacerbation with respiratory failure. Intubated 7/17-7/19. PMHx: COPD, HLD, CAD, ICD, NSTEMI, CABG, vascular dementia, HFrEF, HTN, anemia, gout, OSA with CPAP use, and CKD   OT comments  OT treatment session with focus on self-care re-education, functional transfers and mobility. Completes sit to stand transfers x2 with Min guard and cues for hand placement. Short-distance mobility household distances with RW and CGA. Completes 1/3 grooming tasks standing at sink level. Continues to be limited by generalized weakness/debility, decreased cardiopulmonary endurance and need for increased supplemental O2. OT will continue to follow acutely. Recommendation updated to [place].    Recommendations for follow up therapy are one component of a multi-disciplinary discharge planning process, led by the attending physician.  Recommendations may be updated based on patient status, additional functional criteria and insurance authorization.    Follow Up Recommendations  Home health OT    Assistance Recommended at Discharge Intermittent Supervision/Assistance  Patient can return home with the following  A little help with bathing/dressing/bathroom;Help with stairs or ramp for entrance;Direct supervision/assist for medications management;Assist for transportation;Direct supervision/assist for financial management   Equipment Recommendations  Other (comment) (Rolling walker)    Recommendations for Other Services      Precautions / Restrictions Precautions Precautions: Fall Precaution Comments: watch sats Restrictions Weight Bearing Restrictions: No       Mobility Bed Mobility Overal bed mobility: Needs Assistance Bed Mobility: Rolling, Sidelying to Sit Rolling: Supervision Sidelying to sit: Min guard        General bed mobility comments: Able to come to EOB with Min guard to elevate trunk. Use of grab bar and HOB slightly elevated.    Transfers Overall transfer level: Needs assistance Equipment used: None Transfers: Sit to/from Stand Sit to Stand: Min guard           General transfer comment: Min guard for sit to stand x2 with cues for hand placement/safety.     Balance Overall balance assessment: Needs assistance Sitting-balance support: Feet supported Sitting balance-[name]: Good     Standing balance support: No upper extremity supported Standing balance-[name]: Fair Standing balance comment: Able to maintain static standing at sink level without UE support.                           ADL either performed or assessed with clinical judgement   ADL Overall ADL's : Needs assistance/impaired     Grooming: Wash/dry hands;Wash/dry face;Min guard;Standing Grooming Details (indicate cue type and reason): 1/3 grooming tasks with Min guard to supervision A standing at sink level.                               General ADL Comments: Treatment session with focus on OOB activity tolerance, safety with RW and patient/family eudcation in prep for safe return home.    Extremity/Trunk Assessment              Vision       Perception     Praxis      Cognition Arousal/Alertness: Awake/alert Behavior During Therapy: WFL for tasks assessed/performed Overall Cognitive Status: History of cognitive impairments - at baseline  Exercises      Shoulder Instructions       General Comments SpO2 98% on 5L O2 via [place] at rest. 96% on 5L with light activity/mobility.    Pertinent Vitals/ Pain       Pain Assessment Pain Assessment: No/denies pain  Home Living                                          Prior Functioning/Environment              Frequency  Min 2X/week         Progress Toward Goals  OT Goals(current goals can now be found in the care plan section)  Progress towards OT goals: Progressing toward goals  Acute Rehab OT Goals Patient Stated Goal: To return home OT Goal Formulation: With patient Time For Goal Achievement: 07/28/22 Potential to Achieve Goals: Good ADL Goals Pt Will Perform Grooming: with supervision;standing Pt Will Perform Lower Body Bathing: with supervision;sit to/from stand Pt Will Perform Lower Body Dressing: with supervision;sit to/from stand Pt Will Transfer to Toilet: with supervision;ambulating;regular height toilet Pt Will Perform Toileting - Clothing Manipulation and hygiene: with supervision;sit to/from stand  Plan Discharge plan remains appropriate    Co-evaluation                 AM-PAC OT "6 Clicks" Daily Activity     Outcome Measure   Help from another person eating meals?: None Help from another person taking care of personal grooming?: None Help from another person toileting, which includes using toliet, bedpan, or urinal?: A Little Help from another person bathing (including washing, rinsing, drying)?: A Little Help from another person to put on and taking off regular upper body clothing?: A Little Help from another person to put on and taking off regular lower body clothing?: A Little 6 Click Score: 20    End of Session Equipment Utilized During Treatment: Gait belt;Oxygen  OT Visit Diagnosis: Unsteadiness on feet (R26.81)   Activity Tolerance Patient tolerated treatment well   Patient Left in chair;with call bell/phone within reach;with family/visitor present   Nurse Communication Mobility status        Time: 3710-6269 OT Time Calculation (min): 17 min  Charges: OT General Charges $OT Visit: 1 Visit OT Treatments $Self Care/Home Management : 8-22 mins  Joe Wiggins H. OTR/L Supplemental OT, Department of rehab services [phone]  Joe Wiggins R H. 07/19/2022, 1:49 PM

## 2022-07-20 ENCOUNTER — Other Ambulatory Visit (HOSPITAL_COMMUNITY): Payer: Self-pay

## 2022-07-20 DIAGNOSIS — J9601 Acute respiratory failure with hypoxia: Secondary | ICD-10-CM | POA: Diagnosis not present

## 2022-07-20 DIAGNOSIS — E440 Moderate protein-calorie malnutrition: Secondary | ICD-10-CM | POA: Diagnosis not present

## 2022-07-20 LAB — BASIC METABOLIC PANEL
Anion gap: 6 (ref 5–15)
BUN: 83 mg/dL — ABNORMAL HIGH (ref 8–23)
CO2: 22 mmol/L (ref 22–32)
Calcium: 8.7 mg/dL — ABNORMAL LOW (ref 8.9–10.3)
Chloride: 107 mmol/L (ref 98–111)
Creatinine, Ser: 4.22 mg/dL — ABNORMAL HIGH (ref 0.61–1.24)
GFR, Estimated: 14 mL/min — ABNORMAL LOW (ref >60)
Glucose, Bld: 107 mg/dL — ABNORMAL HIGH (ref 70–99)
Potassium: 3.9 mmol/L (ref 3.5–5.1)
Sodium: 135 mmol/L (ref 135–145)

## 2022-07-20 LAB — GLUCOSE, CAPILLARY
Glucose-Capillary: 109 mg/dL — ABNORMAL HIGH (ref 70–99)
Glucose-Capillary: 123 mg/dL — ABNORMAL HIGH (ref 70–99)

## 2022-07-20 MED ORDER — OXYCODONE HCL 5 MG/5ML PO SOLN
2.5000 mg | ORAL | 0 refills | Status: AC | PRN
  Filled 2022-07-20: qty 30, 2d supply, fill #0

## 2022-07-20 MED ORDER — SENNOSIDES-DOCUSATE SODIUM 8.6-50 MG PO TABS
1.0000 | ORAL_TABLET | Freq: Two times a day (BID) | ORAL | 0 refills | Status: AC
  Filled 2022-07-20: qty 30, 15d supply, fill #0

## 2022-07-20 MED ORDER — CARVEDILOL 25 MG PO TABS: 25.0000 mg | ORAL_TABLET | Freq: Two times a day (BID) | ORAL | 0 refills | Status: DC

## 2022-07-20 MED ORDER — FUROSEMIDE 10 MG/ML IJ SOLN
80.0000 mg | Freq: Once | INTRAMUSCULAR | Status: AC
  Administered 2022-07-20: 80 mg via INTRAVENOUS
  Filled 2022-07-20: qty 8

## 2022-07-20 NOTE — TOC Transition Note (Signed)
Transition of Care [facility]) - CM/SW Discharge Note   Patient Details  Name: Joe Wiggins MRN: [id number] Date of Birth: [birth year]  [facility] CM/SW Contact:  Angelita Ingles, RN Phone Number:[phone]  07/20/2022, 11:58 AM   Clinical Narrative:    [name] has been called for transport home. Address has been verified with wife. D/c packet in patients chart. No other needs noted.         Patient Goals and CMS Choice        Discharge Placement                       Discharge Plan and Services                                     Social Determinants of Health (SDOH) Interventions     Readmission Risk Interventions     No data to display

## 2022-07-20 NOTE — Progress Notes (Signed)
RN went over d/c summary w/ pt and his wife. Belongings w/ pt's wife, including TOC meds. PTAR to transport pt home. NT removed PIVs.

## 2022-07-20 NOTE — Progress Notes (Signed)
Physical Therapy Treatment Patient Details Name: Joe Wiggins MRN: [id number] DOB: [birth year] Today's Date: 07/20/2022   History of Present Illness 76 yo male admitted 7/17 with acute CHf exacerbation with respiratory failure. Intubated 7/17-7/19. PMHx: COPD, HLD, CAD, ICD, NSTEMI, CABG, vascular dementia, HFrEF, HTN, anemia, gout, OSA with CPAP use, and CKD    PT Comments    Pt received supine and agreeable to session with increased tolerance for OOB activity, despite increased complaints of fatigue. Pt able to demonstrate ambulation with RW with up to light min assist to manage RW. Further distance and exercise deferred secondary to pt fatigue. SpO2 98% on 5L O2 via [place] at rest, 99% on 6L (O2 tank with no option of 5L) during ambulation. Continue education on importance and benefits of, and continued encouraged for pt to continue mobility throughout day once home, as well as decreasing amount of time spent in bed, with pt and spouse verbalizing understanding. Pt continues to benefit from skilled PT services to progress toward functional mobility goals.    Recommendations for follow up therapy are one component of a multi-disciplinary discharge planning process, led by the attending physician.  Recommendations may be updated based on patient status, additional functional criteria and insurance authorization.  Follow Up Recommendations  Home health PT     Assistance Recommended at Discharge Frequent or constant Supervision/Assistance  Patient can return home with the following A little help with walking and/or transfers;A little help with bathing/dressing/bathroom;Assistance with cooking/housework;Direct supervision/assist for financial management;Help with stairs or ramp for entrance;Direct supervision/assist for medications management;Assist for transportation   Equipment Recommendations  None recommended by PT    Recommendations for Other Services       Precautions / Restrictions  Precautions Precautions: Fall Precaution Comments: watch sats Restrictions Weight Bearing Restrictions: No     Mobility  Bed Mobility Overal bed mobility: Needs Assistance Bed Mobility: Rolling, Sidelying to Sit, Sit to Supine Rolling: Supervision Sidelying to sit: Min assist   Sit to supine: Min assist   General bed mobility comments: min assist to elevate trunk and to bring BLE into bed    Transfers Overall transfer level: Needs assistance Equipment used: Rolling walker (2 wheels) Transfers: Sit to/from Stand Sit to Stand: Min guard           General transfer comment: Min guard for safety    Ambulation/Gait Ambulation/Gait assistance: Min guard Gait Distance (Feet): 50 Feet Assistive device: Rolling walker (2 wheels) Gait Pattern/deviations: Step-through pattern, Decreased stride length Gait velocity: decr     General Gait Details: slow guarded gait with RW, no LOB, noted DOE and pt stating increased fatigue with LE wekaness   Stairs             Wheelchair Mobility    Modified Rankin (Stroke Patients Only)       Balance Overall balance assessment: Needs assistance Sitting-balance support: Feet supported Sitting balance-[name]: Good     Standing balance support: No upper extremity supported Standing balance-[name]: Fair Standing balance comment: Able to maintain static standing at sink level without UE support.                            Cognition Arousal/Alertness: Awake/alert Behavior During Therapy: WFL for tasks assessed/performed Overall Cognitive Status: History of cognitive impairments - at baseline  Exercises      General Comments General comments (skin integrity, edema, etc.): SpO2 98% on 5L O2 via [place] at rest. 99% on 6L (O2 tank with no option of 5L) with light activity/mobility. encouraged pt to continue mobility throughout day, as well as decreasing  amount of time spent in bed, with pt and spouse verbalizing understanding      Pertinent Vitals/Pain Pain Assessment Pain Assessment: No/denies pain Faces Pain Scale: Hurts a little bit Pain Location: general Pain Descriptors / Indicators: Sore Pain Intervention(s): Monitored during session, Limited activity within patient's tolerance    Home Living                          Prior Function            PT Goals (current goals can now be found in the care plan section) Acute Rehab PT Goals PT Goal Formulation: With patient/family Time For Goal Achievement: 07/28/22    Frequency    Min 3X/week      PT Plan Current plan remains appropriate    Co-evaluation              AM-PAC PT "6 Clicks" Mobility   Outcome Measure  Help needed turning from your back to your side while in a flat bed without using bedrails?: A Little Help needed moving from lying on your back to sitting on the side of a flat bed without using bedrails?: A Little Help needed moving to and from a bed to a chair (including a wheelchair)?: A Little Help needed standing up from a chair using your arms (e.g., wheelchair or bedside chair)?: A Little Help needed to walk in hospital room?: A Little Help needed climbing 3-5 steps with a railing? : A Lot 6 Click Score: 17    End of Session   Activity Tolerance: Patient tolerated treatment well;Patient limited by fatigue Patient left: in bed;with call bell/phone within reach;with family/visitor present Nurse Communication: Mobility status PT Visit Diagnosis: Other abnormalities of gait and mobility (R26.89);Difficulty in walking, not elsewhere classified (R26.2);Muscle weakness (generalized) (M62.81)     Time: 9211-9417 PT Time Calculation (min) (ACUTE ONLY): 19 min  Charges:  $Therapeutic Activity: 8-22 mins                     Dezarae Mcclaran R. PTA Acute Rehabilitation Services Office: [place] 07/20/2022, 12:01 PM

## 2022-07-20 NOTE — Discharge Summary (Signed)
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  In the ED he was intubated and admitted to the ICU, creatinine was 3.0 on admission, he was admitted to the ICU for respiratory failure secondary to pulmonary edema and COPD exacerbation.  Hospital Course:   Acute hypoxic and hypercarbic respiratory failure Required mechanical ventilation, extubated 7/19 -Diuresed with IV Lasix and treated for COPD exacerbation -wean O2 down to 2 L, Lasix held for the last several days on account of worsening AKI, diuretics repeated for volume overload yesterday, he will continue torsemide after discharge  -Discharge planning, home with hospice services  today   Acute on chronic systolic CHF Complicated by AKI on CKD 4 -Cards and nephrology following, diuresed with IV Lasix initially, diuretics were then held, creatinine was plateauing now 3.9-4.2, discussed with nephrology, overall would be a poor dialysis  candidate should his kidney function not improve, palliative consulted -Echo with EF of 40-45% with regional wall motion abnormalities, plan for medical management given renal insufficiency and poor functional status -Continue Coreg, aspirin, statin -GDMT limited by CKD, continue low-dose hydralazine and Imdur -Diuresed with IV Lasix intermittently -Restarted torsemide at discharge, -Seen by palliative care, had goals of care meeting, decision made for discharge home with hospice services, ICD was turned off   NSTEMI CAD/CABG -NSTEMI treated medically, echo with wall motion abnormalities -Continue aspirin, Coreg, statin, treated with IV heparin X 48 hours, now back on Eliquis,  -Hospice services at discharge   AKI on CKD 4 Urinary retention/suspected BPH -Baseline creatinine around 2.4-2.8, and on admission creatinine> 3, now 4.2 -Continue Foley catheter, started on Flomax-Will need Foley catheter at discharge -Secondary to cardiorenal syndrome, seen by nephrology, felt to be a poor dialysis candidate with poor functional status, COPD, cardiomyopathy etc. -Palliative care consulted, now plan for hospice services at discharge, no dialysis   COPD exacerbation -Treated with ceftriaxone, bronchodilators -antibiotics discontinued   Paroxysmal atrial fibrillation -Atrial tachycardia -Appreciate EP input, continue Coreg and changed amiodarone to po -Continue Eliquis   DO NOT RESUSCITATE   Consultants: Cards, nephrology  Discharge Exam: Vitals:   07/20/22 0731 07/20/22 0755  BP:  (!) 141/52  Pulse:  61  Resp:  18  Temp:  98.4 F (36.9 C)  SpO2: 99% 100%   General exam: Chronically ill male sitting up in bed, AAO x2, no distress HEENT: Positive JVD CVs: S1-S2, irregularly irregular rhythm Lungs: Decreased breath sounds at bases, trace basilar rales Abdomen: Soft, nontender, bowel sounds present Extremities: No edema  Psychiatry:  Mood &  affect appropriate.   Discharge  Instructions   Discharge Instructions     Diet - low sodium heart healthy   Complete by: As directed    Increase activity slowly   Complete by: As directed       Allergies as of 07/20/2022       Reactions   Ipratropium Other (See Comments)   Metoprolol    Was bringing blood pressure too low and he would pass out.   Morphine And Related Nausea Only   Vicodin [hydrocodone-acetaminophen] Other (See Comments)   Hallucinations   Penicillins Rash   Did it involve swelling of the face/tongue/throat, SOB, or low BP? No Did it involve sudden or severe rash/hives, skin peeling, or any reaction on the inside of your mouth or nose? Yes Did you need to seek medical attention at a hospital or doctor's office? No When did it last happen? Over 7 Years Ago      If all above answers are "NO", may proceed with cephalosporin use.        Medication List     STOP taking these medications    cholecalciferol 25 MCG (1000 UNIT) tablet Commonly known as: VITAMIN D   ferrous sulfate 325 (65 FE) MG tablet   folic acid 741 MCG tablet Commonly known as: FOLVITE   Jardiance 10 MG Tabs tablet Generic drug: empagliflozin   multivitamin tablet   valACYclovir 1000 MG tablet Commonly known as: VALTREX   VITAMIN B 12 PO       TAKE these medications    albuterol 108 (90 Base) MCG/ACT inhaler Commonly known as: VENTOLIN HFA Inhale 2 puffs into the lungs every 6 (six) hours as needed for wheezing or shortness of breath.   allopurinol 100 MG tablet Commonly known as: ZYLOPRIM Take 100 mg by mouth daily.   aspirin EC 81 MG tablet Take 1 tablet (81 mg total) by mouth daily. Swallow whole.   atorvastatin 40 MG tablet Commonly known as: LIPITOR TAKE 1 TABLET EVERY DAY   carvedilol 25 MG tablet Commonly known as: COREG Take 1 tablet (25 mg total) by mouth 2 (two) times daily with a meal. What changed:  how much to take how to take this when to take this additional instructions    cetirizine 10 MG tablet Commonly known as: ZYRTEC Take 10 mg by mouth daily.   Eliquis 5 MG Tabs tablet Generic drug: apixaban TAKE 1 TABLET TWICE DAILY What changed: how much to take   hydrALAZINE 10 MG tablet Commonly known as: APRESOLINE Take 1 tablet (10 mg total) by mouth 3 (three) times daily.   isosorbide mononitrate 30 MG 24 hr tablet Commonly known as: IMDUR Take 0.5 tablets (15 mg total) by mouth daily.   nitroGLYCERIN 0.4 MG SL tablet Commonly known as: NITROSTAT PLACE 1 TABLET (0.4 MG TOTAL) UNDER THE TONGUE EVERY 5 (FIVE) MINUTES AS NEEDED FOR CHEST PAIN.   oxyCODONE 5 MG/5ML solution Commonly known as: ROXICODONE Take 2.5 mLs (2.5 mg total) by mouth every 2 (two) hours as needed (shortness of breath or pain).   senna-docusate 8.6-50 MG tablet Commonly known as: Senokot-S Take 1 tablet by mouth 2 (two) times daily.   torsemide 20 MG tablet Commonly known as: DEMADEX Take 40 mg by mouth 2 (two) times daily.   Trelegy Ellipta 100-62.5-25 MCG/ACT Aepb Generic drug: Fluticasone-Umeclidin-Vilant Inhale 1 puff into the lungs daily.       Allergies  Allergen Reactions   Ipratropium Other (See Comments)  Metoprolol     Was bringing blood pressure too low and he would pass out.   Morphine And Related Nausea Only   Vicodin [Hydrocodone-Acetaminophen] Other (See Comments)    Hallucinations    Penicillins Rash    Did it involve swelling of the face/tongue/throat, SOB, or low BP? No Did it involve sudden or severe rash/hives, skin peeling, or any reaction on the inside of your mouth or nose? Yes Did you need to seek medical attention at a hospital or doctor's office? No When did it last happen? Over 7 Years Ago      If all above answers are "NO", may proceed with cephalosporin use.        The results of significant diagnostics from this hospitalization (including imaging, microbiology, ancillary and laboratory) are listed below for reference.     Significant Diagnostic Studies: DG CHEST PORT 1 VIEW  Result Date: 07/18/2022 CLINICAL DATA:  Shortness of breath respiratory distress. EXAM: PORTABLE CHEST 1 VIEW COMPARISON:  Chest x-ray 07/13/2022. FINDINGS: Increased peripheral predominant airspace opacities bilaterally. Suspected small left pleural effusion. No visible pneumothorax. Cardiomediastinal silhouette is unchanged. Median sternotomy. Left subclavian approach cardiac rhythm maintenance device. Polyarticular degenerative change. IMPRESSION: 1. Increased peripheral predominant interstitial opacities bilaterally, suspicious for interstitial edema or atypical/multifocal infection. Recommend follow-up to resolution. 2. Suspected small left pleural effusion. Electronically Signed   By: Margaretha Sheffield M.D.   On: 07/18/2022 09:51   US RENAL  Result Date: 07/13/2022 CLINICAL DATA:  Acute kidney injury. EXAM: RENAL / URINARY TRACT ULTRASOUND COMPLETE COMPARISON:  None Available. FINDINGS: Right Kidney: Renal measurements: 8.9 x 4.9 x 6.1 cm = volume: 138.4 mL. Increased parenchymal echogenicity. No mass or hydronephrosis visualized. Left Kidney: Renal measurements: 9.3 by 4.5 x 5.1 cm = volume: 112.9 mL. Increased parenchymal echogenicity. No mass or hydronephrosis visualized. Bladder: No focal bladder abnormality. Echogenic debris noted within the dependent portion of the bladder. Other: Diminished exam detail due to large body habitus and overlying bowel gas as well as portable technique. IMPRESSION: 1. No hydronephrosis. 2. Bilateral echogenic kidneys compatible with chronic medical renal disease. 3. Echogenic debris noted within the dependent portion of the urinary bladder, which may be seen in the setting of urinary tract infection. Electronically Signed   By: Kerby Moors M.D.   On: 07/13/2022 16:11   DG Chest Port 1 View  Result Date: 07/13/2022 CLINICAL DATA:  Respiratory failure. EXAM: PORTABLE CHEST 1 VIEW COMPARISON:  July 11, 2022. FINDINGS: Stable cardiomediastinal silhouette. Sternotomy wires are noted. Endotracheal and nasogastric tubes are unchanged in position. Left-sided pacemaker is unchanged. Slightly improved bilateral lung opacities are noted suggesting improving pulmonary edema or multifocal pneumonia. Bony thorax is unremarkable. IMPRESSION: Stable support apparatus. Improved bilateral lung opacities as described above. Electronically Signed   By: Marijo Conception M.D.   On: 07/13/2022 08:44   ECHOCARDIOGRAM COMPLETE  Result Date: 07/12/2022    ECHOCARDIOGRAM REPORT   Patient Name:   Joe Wiggins Date of Exam: 07/12/2022 Medical Rec #:  [id number]  Height:       69.0 in Accession #:    [id number] Weight:       180.8 lb Date of Birth:  03/12/1946  BSA:          1.979 m Patient Age:    77 years   BP:           132/75 mmHg Patient Gender: M          HR:  111 bpm. Exam Location:  Inpatient Procedure: 2D Echo, Cardiac Doppler and Color Doppler Indications:     Ischemic cardiomyopathy  History:         Patient has prior history of Echocardiogram examinations, most                  recent 03/08/2022. CHF, CAD, COPD, Arrythmias:Atrial                  Fibrillation; Risk Factors:Hypertension.  Sonographer:     Jefferey Pica Referring Phys:  [id number] Frederik Pear Diagnosing Phys: Eleonore Chiquito MD IMPRESSIONS  1. Left ventricular ejection fraction, by estimation, is 40 to 45%. The left ventricle has mildly decreased function. The left ventricle demonstrates regional wall motion abnormalities (see scoring diagram/findings for description). Left ventricular diastolic function could not be evaluated.  2. Right ventricular systolic function is normal. The right ventricular size is normal. There is mildly elevated pulmonary artery systolic pressure. The estimated right ventricular systolic pressure is 85.0 mmHg.  3. The mitral valve is grossly normal. Trivial mitral valve regurgitation. No evidence of mitral stenosis.  4. The  aortic valve is tricuspid. Aortic valve regurgitation is not visualized. No aortic stenosis is present.  5. The inferior vena cava is normal in size with <50% respiratory variability, suggesting right atrial pressure of 8 mmHg. Comparison(s): No significant change from prior study. FINDINGS  Left Ventricle: Left ventricular ejection fraction, by estimation, is 40 to 45%. The left ventricle has mildly decreased function. The left ventricle demonstrates regional wall motion abnormalities. The left ventricular internal cavity size was normal in size. There is no left ventricular hypertrophy. Left ventricular diastolic function could not be evaluated due to atrial fibrillation. Left ventricular diastolic function could not be evaluated.  LV Wall Scoring: The antero-lateral wall and posterior wall are hypokinetic. Right Ventricle: The right ventricular size is normal. No increase in right ventricular wall thickness. Right ventricular systolic function is normal. There is mildly elevated pulmonary artery systolic pressure. The tricuspid regurgitant velocity is 2.90  m/s, and with an assumed right atrial pressure of 8 mmHg, the estimated right ventricular systolic pressure is 27.7 mmHg. Left Atrium: Left atrial size was normal in size. Right Atrium: Right atrial size was normal in size. Pericardium: There is no evidence of pericardial effusion. Mitral Valve: The mitral valve is grossly normal. Trivial mitral valve regurgitation. No evidence of mitral valve stenosis. Tricuspid Valve: The tricuspid valve is grossly normal. Tricuspid valve regurgitation is mild . No evidence of tricuspid stenosis. Aortic Valve: The aortic valve is tricuspid. Aortic valve regurgitation is not visualized. No aortic stenosis is present. Aortic valve peak gradient measures 3.2 mmHg. Pulmonic Valve: The pulmonic valve was grossly normal. Pulmonic valve regurgitation is not visualized. No evidence of pulmonic stenosis. Aorta: The aortic root is  normal in size and structure. Venous: The inferior vena cava is normal in size with less than 50% respiratory variability, suggesting right atrial pressure of 8 mmHg. IAS/Shunts: The atrial septum is grossly normal. Additional Comments: A device lead is visualized in the right atrium and right ventricle.  LEFT VENTRICLE PLAX 2D LVIDd:         5.30 cm LVIDs:         4.60 cm LV PW:         1.20 cm LV IVS:        1.10 cm LVOT diam:     2.00 cm LV SV:  54 LV SV Index:   27 LVOT Area:     3.14 cm  RIGHT VENTRICLE             IVC RV Basal diam:  3.50 cm     IVC diam: 1.90 cm RV Mid diam:    2.50 cm RV S prime:     11.50 cm/s TAPSE (M-mode): 1.6 cm LEFT ATRIUM             Index        RIGHT ATRIUM           Index LA diam:        3.70 cm 1.87 cm/m   RA Area:     20.60 cm LA Vol (A2C):   51.8 ml 26.17 ml/m  RA Volume:   61.80 ml  31.23 ml/m LA Vol (A4C):   56.6 ml 28.60 ml/m LA Biplane Vol: 53.9 ml 27.23 ml/m  AORTIC VALVE                 PULMONIC VALVE AV Area (Vmax): 2.91 cm     PV Vmax:       0.75 m/s AV Vmax:        89.95 cm/s   PV Peak grad:  2.3 mmHg AV Peak Grad:   3.2 mmHg LVOT Vmax:      83.20 cm/s LVOT Vmean:     52.100 cm/s LVOT VTI:       0.172 m  AORTA Ao Root diam: 3.70 cm TRICUSPID VALVE TR Peak grad:   33.6 mmHg TR Vmax:        290.00 cm/s  SHUNTS Systemic VTI:  0.17 m Systemic Diam: 2.00 cm Eleonore Chiquito MD Electronically signed by Eleonore Chiquito MD Signature Date/Time: 07/12/2022/3:52:34 PM    Final (Updated)    DG Abd Portable 1V  Result Date: 07/11/2022 CLINICAL DATA:  Orogastric tube placement EXAM: PORTABLE ABDOMEN - 1 VIEW COMPARISON:  12/26/2017 FINDINGS: Orogastric tube enters the stomach with its tip in the mid body. Gas pattern unremarkable. IMPRESSION: Tube tip in the mid body of the stomach. Electronically Signed   By: Nelson Chimes M.D.   On: 07/11/2022 18:05   DG Chest Portable 1 View  Result Date: 07/11/2022 CLINICAL DATA:  A 76 year old male presents post intubation for  assessment. EXAM: PORTABLE CHEST 1 VIEW COMPARISON:  April 15, 2022 FINDINGS: Endotracheal tube in place 7.2 cm from the carina, tip between clavicular heads. Gastric tube courses through in off the field the radiograph into the upper abdomen. Multi lead pacer device in place, power pack over the LEFT chest. EKG leads over the chest. Post median sternotomy. Stable cardiomediastinal contours. Diffuse interstitial and alveolar opacities throughout both LEFT and RIGHT chest slightly worse in the RIGHT lower chest. No lobar consolidation. Blunting of LEFT costodiaphragmatic sulcus no pneumothorax. On limited assessment there is no acute skeletal process. IMPRESSION: 1. Endotracheal tube 7.2 cm from the carina. 2. Diffuse interstitial and alveolar opacities throughout both lungs, slightly worse in the RIGHT lower chest. Pattern of disease could reflect asymmetric pulmonary edema or atypical/multifocal infection 3. Question small left effusion. 4. Gastric tube courses through off the field the radiograph into the upper abdomen. Electronically Signed   By: Zetta Bills M.D.   On: 07/11/2022 14:12    Microbiology: Recent Results (from the past 240 hour(s))  Blood culture (routine x 2)     Status: None   Collection Time: 07/11/22  2:50 PM   Specimen: BLOOD LEFT  ARM  Result Value Ref Range Status   Specimen Description BLOOD LEFT ARM  Final   Special Requests   Final    BOTTLES DRAWN AEROBIC AND ANAEROBIC Blood Culture results may not be optimal due to an inadequate volume of blood received in culture bottles   Culture   Final    NO GROWTH 5 DAYS Performed at [facility], [place] [address] [place], [place] [address]    Report Status 07/16/2022 FINAL  Final  Blood culture (routine x 2)     Status: Abnormal   Collection Time: 07/11/22  3:00 PM   Specimen: BLOOD  Result Value Ref Range Status   Specimen Description BLOOD LEFT ANTECUBITAL  Final   Special Requests   Final    BOTTLES DRAWN AEROBIC AND  ANAEROBIC Blood Culture adequate volume   Culture  Setup Time   Final    GRAM POSITIVE RODS ANAEROBIC BOTTLE ONLY CRITICAL RESULT CALLED TO, READ BACK BY AND VERIFIED WITH: PHARMD JAMES LEDFORD 07/12/22@4 :50 BY TW    Culture (A)  Final    BACILLUS SPECIES Standardized susceptibility testing for this organism is not available. Performed at [facility], [place] [address] [place], [place] [address]    Report Status 07/13/2022 FINAL  Final  MRSA Next Gen by PCR, Nasal     Status: None   Collection Time: 07/11/22  5:36 PM   Specimen: Nasal Mucosa; Nasal Swab  Result Value Ref Range Status   MRSA by PCR Next Gen NOT DETECTED NOT DETECTED Final    Comment: (NOTE) The GeneXpert MRSA Assay (FDA approved for NASAL specimens only), is one component of a comprehensive MRSA colonization surveillance program. It is not intended to diagnose MRSA infection nor to guide or monitor treatment for MRSA infections. Test performance is not FDA approved in patients less than 68 years old. Performed at [facility], [place] [address] [place], [place] [address]   Culture, Respiratory w Gram Stain     Status: None   Collection Time: 07/11/22  5:40 PM   Specimen: Tracheal Aspirate; Respiratory  Result Value Ref Range Status   Specimen Description TRACHEAL ASPIRATE  Final   Special Requests Normal  Final   Gram Stain   Final    RARE WBC PRESENT, PREDOMINANTLY PMN FEW GRAM POSITIVE COCCI RARE GRAM VARIABLE ROD    Culture   Final    Normal respiratory flora-no Staph aureus or Pseudomonas seen Performed at [facility], [address] [place], [place] [address]    Report Status 07/15/2022 FINAL  Final  SARS Coronavirus 2 by RT PCR (hospital order, performed in [facility] hospital lab) *cepheid single result test* Anterior Nasal Swab     Status: None   Collection Time: 07/12/22 11:42 AM   Specimen: Anterior Nasal Swab  Result Value Ref Range Status   SARS Coronavirus 2 by RT PCR  NEGATIVE NEGATIVE Final    Comment: (NOTE) SARS-CoV-2 target nucleic acids are NOT DETECTED.  The SARS-CoV-2 RNA is generally detectable in upper and lower respiratory specimens during the acute phase of infection. The lowest concentration of SARS-CoV-2 viral copies this assay can detect is 250 copies / mL. A negative result does not preclude SARS-CoV-2 infection and should not be used as the sole basis for treatment or other patient management decisions.  A negative result may occur with improper specimen collection / handling, submission of specimen other than nasopharyngeal swab, presence of viral mutation(s) within the  areas targeted by this assay, and inadequate number of viral copies (<250 copies / mL). A negative result must be combined with clinical observations, patient history, and epidemiological information.  Fact Sheet for Patients:   https://www.patel.info/  Fact Sheet for Healthcare Providers: https://hall.com/  This test is not yet approved or  cleared by the [place] FDA and has been authorized for detection and/or diagnosis of SARS-CoV-2 by FDA under an Emergency Use Authorization (EUA).  This EUA will remain in effect (meaning this test can be used) for the duration of the COVID-19 declaration under Section 564(b)(1) of the Act, 21 U.S.C. section 360bbb-3(b)(1), unless the authorization is terminated or revoked sooner.  Performed at [facility], [place] [address], [place], [place] [address]      Labs: Basic Metabolic Panel: Recent Labs  Lab 07/14/22 0603 07/15/22 0726 07/16/22 0042 07/17/22 0036 07/18/22 0047 07/19/22 0035 07/20/22 0038  NA 141 137 133* 136 138 137 135  K 4.2 3.8 3.6 3.8 4.0 3.9 3.9  CL 102 101 100 104 107 105 107  CO2 22 22 20* 21* 22 19* 22  GLUCOSE 115* 104* 107* 106* 110* 98 107*  BUN 65* 77* 81* 84* 89* 87* 83*  CREATININE 3.75* 3.81* 3.70* 3.66* 3.91* 3.89* 4.22*  CALCIUM  9.4 9.2 8.8* 8.8* 8.9 9.0 8.7*  MG 2.6* 2.3  --   --   --   --   --   PHOS 4.9* 5.3* 5.5* 5.6*  --   --   --    Liver Function Tests: No results for input(s): "AST", "ALT", "ALKPHOS", "BILITOT", "PROT", "ALBUMIN" in the last 168 hours. No results for input(s): "LIPASE", "AMYLASE" in the last 168 hours. No results for input(s): "AMMONIA" in the last 168 hours. CBC: Recent Labs  Lab 07/14/22 0603 07/16/22 0042 07/17/22 0036 07/18/22 0047 07/19/22 0035  WBC 12.1* 8.1 8.5 9.5 6.6  HGB 9.9* 8.6* 8.6* 8.2* 8.3*  HCT 30.2* 27.0* 26.5* 25.6* 26.1*  MCV 95.6 96.1 95.0 96.2 96.0  PLT 262 189 177 187 176   Cardiac Enzymes: No results for input(s): "CKTOTAL", "CKMB", "CKMBINDEX", "TROPONINI" in the last 168 hours. BNP: BNP (last 3 results) Recent Labs    04/27/22 1343 07/11/22 1352 07/13/22 0613  BNP 603.6* 777.0* 315.4*    ProBNP (last 3 results) No results for input(s): "PROBNP" in the last 8760 hours.  CBG: Recent Labs  Lab 07/19/22 0616 07/19/22 1154 07/19/22 1629 07/19/22 2112 07/20/22 0625  GLUCAP 116* 128* 128* 123* 109*       Signed:  Domenic Polite MD.  [facility] 07/20/2022, 11:01 AM

## 2022-07-21 ENCOUNTER — Ambulatory Visit (INDEPENDENT_AMBULATORY_CARE_PROVIDER_SITE_OTHER)

## 2022-07-21 DIAGNOSIS — I255 Ischemic cardiomyopathy: Secondary | ICD-10-CM

## 2022-07-21 LAB — CUP PACEART REMOTE DEVICE CHECK
Battery Remaining Longevity: 41 mo
Battery Remaining Percentage: 44 %
Battery Voltage: 2.92 V
Brady Statistic AP VP Percent: 0 %
Brady Statistic AP VS Percent: 17 %
Brady Statistic AS VP Percent: 0 %
Brady Statistic AS VS Percent: 82 %
Brady Statistic RA Percent Paced: 15 %
Brady Statistic RV Percent Paced: 0 %
Date Time Interrogation Session: 20230726210750
HighPow Impedance: 53 Ohm
HighPow Impedance: 53 Ohm
Implantable Lead Implant Date: 20140217
Implantable Lead Implant Date: 20190104
Implantable Lead Location: 753859
Implantable Lead Location: 753860
Implantable Lead Model: 7122
Implantable Pulse Generator Implant Date: 20190104
Lead Channel Impedance Value: 300 Ohm
Lead Channel Impedance Value: 590 Ohm
Lead Channel Pacing Threshold Amplitude: 0.75 V
Lead Channel Pacing Threshold Amplitude: 1.25 V
Lead Channel Pacing Threshold Pulse Width: 0.5 ms
Lead Channel Pacing Threshold Pulse Width: 0.8 ms
Lead Channel Sensing Intrinsic Amplitude: 0.6 mV
Lead Channel Sensing Intrinsic Amplitude: 12 mV
Lead Channel Setting Pacing Amplitude: 2.5 V
Lead Channel Setting Pacing Amplitude: 2.5 V
Lead Channel Setting Pacing Pulse Width: 0.5 ms
Lead Channel Setting Sensing Sensitivity: 0.5 mV
Pulse Gen Serial Number: 9777027

## 2022-07-27 ENCOUNTER — Ambulatory Visit: Payer: Medicare HMO | Admitting: General Practice

## 2022-07-29 ENCOUNTER — Encounter: Payer: Medicare HMO | Admitting: Internal Medicine

## 2022-08-11 ENCOUNTER — Other Ambulatory Visit: Payer: Self-pay | Admitting: Cardiology

## 2022-08-11 NOTE — Progress Notes (Signed)
Remote ICD transmission.   

## 2022-09-23 ENCOUNTER — Telehealth: Payer: Self-pay

## 2022-09-23 NOTE — Telephone Encounter (Signed)
Per patient wife he passed away on Oct 15, 2022.

## 2022-09-25 DEATH — deceased

## 2022-11-10 ENCOUNTER — Encounter: Payer: Medicare HMO | Admitting: Internal Medicine

## 2022-12-05 ENCOUNTER — Ambulatory Visit: Payer: Medicare HMO | Admitting: Cardiology
